# Patient Record
Sex: Female | Born: 1945
Health system: Southern US, Community
[De-identification: ages and names within clinical notes are randomized; demographics above are authoritative.]

## PROBLEM LIST (undated history)

## (undated) ENCOUNTER — Emergency Department (HOSPITAL_COMMUNITY): Payer: Medicare Other

## (undated) DIAGNOSIS — F419 Anxiety disorder, unspecified: Secondary | ICD-10-CM

## (undated) DIAGNOSIS — L509 Urticaria, unspecified: Secondary | ICD-10-CM

## (undated) DIAGNOSIS — K5900 Constipation, unspecified: Secondary | ICD-10-CM

## (undated) DIAGNOSIS — J309 Allergic rhinitis, unspecified: Secondary | ICD-10-CM

## (undated) DIAGNOSIS — J45909 Unspecified asthma, uncomplicated: Secondary | ICD-10-CM

## (undated) DIAGNOSIS — I255 Ischemic cardiomyopathy: Secondary | ICD-10-CM

## (undated) DIAGNOSIS — H11411 Vascular abnormalities of conjunctiva, right eye: Secondary | ICD-10-CM

## (undated) DIAGNOSIS — Z91018 Allergy to other foods: Secondary | ICD-10-CM

## (undated) DIAGNOSIS — I219 Acute myocardial infarction, unspecified: Secondary | ICD-10-CM

## (undated) DIAGNOSIS — N182 Chronic kidney disease, stage 2 (mild): Secondary | ICD-10-CM

## (undated) DIAGNOSIS — M1990 Unspecified osteoarthritis, unspecified site: Secondary | ICD-10-CM

## (undated) DIAGNOSIS — G629 Polyneuropathy, unspecified: Secondary | ICD-10-CM

## (undated) DIAGNOSIS — M26609 Unspecified temporomandibular joint disorder, unspecified side: Secondary | ICD-10-CM

## (undated) DIAGNOSIS — Z9289 Personal history of other medical treatment: Secondary | ICD-10-CM

## (undated) DIAGNOSIS — D649 Anemia, unspecified: Secondary | ICD-10-CM

## (undated) DIAGNOSIS — M797 Fibromyalgia: Secondary | ICD-10-CM

## (undated) DIAGNOSIS — M4120 Other idiopathic scoliosis, site unspecified: Secondary | ICD-10-CM

## (undated) DIAGNOSIS — K2270 Barrett's esophagus without dysplasia: Secondary | ICD-10-CM

## (undated) DIAGNOSIS — E7800 Pure hypercholesterolemia, unspecified: Secondary | ICD-10-CM

## (undated) DIAGNOSIS — K859 Acute pancreatitis without necrosis or infection, unspecified: Secondary | ICD-10-CM

## (undated) DIAGNOSIS — N200 Calculus of kidney: Secondary | ICD-10-CM

## (undated) DIAGNOSIS — I2510 Atherosclerotic heart disease of native coronary artery without angina pectoris: Secondary | ICD-10-CM

## (undated) DIAGNOSIS — I10 Essential (primary) hypertension: Secondary | ICD-10-CM

## (undated) DIAGNOSIS — G43909 Migraine, unspecified, not intractable, without status migrainosus: Secondary | ICD-10-CM

## (undated) DIAGNOSIS — Z789 Other specified health status: Secondary | ICD-10-CM

## (undated) DIAGNOSIS — K219 Gastro-esophageal reflux disease without esophagitis: Secondary | ICD-10-CM

## (undated) DIAGNOSIS — IMO0002 Reserved for concepts with insufficient information to code with codable children: Secondary | ICD-10-CM

## (undated) DIAGNOSIS — J189 Pneumonia, unspecified organism: Secondary | ICD-10-CM

## (undated) DIAGNOSIS — K449 Diaphragmatic hernia without obstruction or gangrene: Secondary | ICD-10-CM

## (undated) DIAGNOSIS — N816 Rectocele: Secondary | ICD-10-CM

## (undated) DIAGNOSIS — IMO0001 Reserved for inherently not codable concepts without codable children: Secondary | ICD-10-CM

## (undated) DIAGNOSIS — Z8719 Personal history of other diseases of the digestive system: Secondary | ICD-10-CM

## (undated) DIAGNOSIS — G4700 Insomnia, unspecified: Secondary | ICD-10-CM

## (undated) DIAGNOSIS — J342 Deviated nasal septum: Secondary | ICD-10-CM

## (undated) HISTORY — PX: TEMPOROMANDIBULAR JOINT SURGERY: SHX35

## (undated) HISTORY — PX: CYSTOCELE REPAIR: SHX163

## (undated) HISTORY — DX: Barrett's esophagus without dysplasia: K22.70

## (undated) HISTORY — DX: Other idiopathic scoliosis, site unspecified: M41.20

## (undated) HISTORY — DX: Other specified health status: Z78.9

## (undated) HISTORY — DX: Deviated nasal septum: J34.2

## (undated) HISTORY — PX: UPPER GASTROINTESTINAL ENDOSCOPY: SHX188

## (undated) HISTORY — DX: Reserved for inherently not codable concepts without codable children: IMO0001

## (undated) HISTORY — DX: Urticaria, unspecified: L50.9

## (undated) HISTORY — DX: Insomnia, unspecified: G47.00

## (undated) HISTORY — PX: OTHER SURGICAL HISTORY: SHX169

## (undated) HISTORY — PX: TONSILLECTOMY: SUR1361

## (undated) HISTORY — DX: Rectocele: N81.6

## (undated) HISTORY — PX: ANKLE SURGERY: SHX546

## (undated) HISTORY — DX: Reserved for concepts with insufficient information to code with codable children: IMO0002

## (undated) HISTORY — PX: TYMPANOSTOMY TUBE PLACEMENT: SHX32

## (undated) HISTORY — DX: Gastro-esophageal reflux disease without esophagitis: K21.9

## (undated) HISTORY — DX: Diaphragmatic hernia without obstruction or gangrene: K44.9

## (undated) HISTORY — DX: Acute myocardial infarction, unspecified: I21.9

## (undated) HISTORY — DX: Allergic rhinitis, unspecified: J30.9

## (undated) HISTORY — DX: Unspecified osteoarthritis, unspecified site: M19.90

## (undated) HISTORY — PX: COLONOSCOPY W/ POLYPECTOMY: SHX1380

## (undated) HISTORY — PX: COLONOSCOPY: SHX174

## (undated) HISTORY — DX: Pure hypercholesterolemia, unspecified: E78.00

## (undated) HISTORY — PX: APPENDECTOMY: SHX54

## (undated) HISTORY — DX: Vascular abnormalities of conjunctiva, right eye: H11.411

## (undated) HISTORY — DX: Personal history of other medical treatment: Z92.89

## (undated) HISTORY — PX: HAND SURGERY: SHX662

## (undated) HISTORY — DX: Chronic kidney disease, stage 2 (mild): N18.2

## (undated) HISTORY — PX: DENTAL SURGERY: SHX609

## (undated) HISTORY — PX: POLYPECTOMY: SHX149

## (undated) HISTORY — DX: Polyneuropathy, unspecified: G62.9

## (undated) HISTORY — PX: NASAL SEPTUM SURGERY: SHX37

## (undated) HISTORY — DX: Anemia, unspecified: D64.9

## (undated) HISTORY — DX: Acute pancreatitis without necrosis or infection, unspecified: K85.90

## (undated) HISTORY — DX: Unspecified temporomandibular joint disorder, unspecified side: M26.609

## (undated) HISTORY — PX: SINOSCOPY: SHX187

## (undated) HISTORY — PX: BLADDER SUSPENSION: SHX72

## (undated) HISTORY — PX: VAGINAL HYSTERECTOMY: SUR661

---

## 1999-04-22 ENCOUNTER — Encounter: Payer: Self-pay | Admitting: Internal Medicine

## 1999-04-22 ENCOUNTER — Other Ambulatory Visit: Admission: RE | Admit: 1999-04-22 | Discharge: 1999-04-22 | Payer: Self-pay | Admitting: Internal Medicine

## 1999-04-22 ENCOUNTER — Encounter (INDEPENDENT_AMBULATORY_CARE_PROVIDER_SITE_OTHER): Payer: Self-pay

## 2000-04-23 ENCOUNTER — Other Ambulatory Visit: Admission: RE | Admit: 2000-04-23 | Discharge: 2000-04-23 | Payer: Self-pay | Admitting: Family Medicine

## 2000-08-27 ENCOUNTER — Encounter: Payer: Self-pay | Admitting: Emergency Medicine

## 2000-08-27 ENCOUNTER — Emergency Department (HOSPITAL_COMMUNITY): Admission: EM | Admit: 2000-08-27 | Discharge: 2000-08-27 | Payer: Self-pay | Admitting: Emergency Medicine

## 2001-03-29 ENCOUNTER — Other Ambulatory Visit: Admission: RE | Admit: 2001-03-29 | Discharge: 2001-03-29 | Payer: Self-pay | Admitting: Emergency Medicine

## 2001-04-02 ENCOUNTER — Emergency Department (HOSPITAL_COMMUNITY): Admission: EM | Admit: 2001-04-02 | Discharge: 2001-04-03 | Payer: Self-pay | Admitting: Emergency Medicine

## 2001-04-09 ENCOUNTER — Encounter: Payer: Self-pay | Admitting: Emergency Medicine

## 2001-04-09 ENCOUNTER — Encounter (INDEPENDENT_AMBULATORY_CARE_PROVIDER_SITE_OTHER): Payer: Self-pay | Admitting: *Deleted

## 2001-04-09 ENCOUNTER — Encounter: Admission: RE | Admit: 2001-04-09 | Discharge: 2001-04-09 | Payer: Self-pay | Admitting: Emergency Medicine

## 2001-04-21 ENCOUNTER — Encounter: Payer: Self-pay | Admitting: Internal Medicine

## 2001-04-21 ENCOUNTER — Other Ambulatory Visit: Admission: RE | Admit: 2001-04-21 | Discharge: 2001-04-21 | Payer: Self-pay | Admitting: Internal Medicine

## 2001-04-21 ENCOUNTER — Encounter (INDEPENDENT_AMBULATORY_CARE_PROVIDER_SITE_OTHER): Payer: Self-pay | Admitting: Specialist

## 2001-05-27 ENCOUNTER — Encounter: Admission: RE | Admit: 2001-05-27 | Discharge: 2001-08-25 | Payer: Self-pay | Admitting: Neurology

## 2001-11-05 ENCOUNTER — Encounter: Admission: RE | Admit: 2001-11-05 | Discharge: 2002-02-03 | Payer: Self-pay | Admitting: Orthopedic Surgery

## 2002-06-16 ENCOUNTER — Other Ambulatory Visit: Admission: RE | Admit: 2002-06-16 | Discharge: 2002-06-16 | Payer: Self-pay | Admitting: Family Medicine

## 2002-11-18 ENCOUNTER — Encounter: Admission: RE | Admit: 2002-11-18 | Discharge: 2002-11-18 | Payer: Self-pay | Admitting: Orthopedic Surgery

## 2002-11-18 ENCOUNTER — Encounter: Payer: Self-pay | Admitting: Orthopedic Surgery

## 2003-03-23 ENCOUNTER — Other Ambulatory Visit: Admission: RE | Admit: 2003-03-23 | Discharge: 2003-03-23 | Payer: Self-pay | Admitting: Obstetrics and Gynecology

## 2004-05-28 ENCOUNTER — Other Ambulatory Visit: Admission: RE | Admit: 2004-05-28 | Discharge: 2004-05-28 | Payer: Self-pay | Admitting: Family Medicine

## 2004-09-17 ENCOUNTER — Ambulatory Visit: Payer: Self-pay | Admitting: Internal Medicine

## 2004-10-28 ENCOUNTER — Ambulatory Visit: Payer: Self-pay | Admitting: Internal Medicine

## 2004-10-29 ENCOUNTER — Ambulatory Visit: Payer: Self-pay | Admitting: Internal Medicine

## 2004-12-18 ENCOUNTER — Ambulatory Visit: Payer: Self-pay | Admitting: Internal Medicine

## 2005-01-06 ENCOUNTER — Ambulatory Visit: Payer: Self-pay | Admitting: Internal Medicine

## 2005-01-16 ENCOUNTER — Ambulatory Visit (HOSPITAL_COMMUNITY): Admission: RE | Admit: 2005-01-16 | Discharge: 2005-01-16 | Payer: Self-pay | Admitting: Orthopedic Surgery

## 2005-01-16 ENCOUNTER — Ambulatory Visit (HOSPITAL_BASED_OUTPATIENT_CLINIC_OR_DEPARTMENT_OTHER): Admission: RE | Admit: 2005-01-16 | Discharge: 2005-01-16 | Payer: Self-pay | Admitting: Orthopedic Surgery

## 2005-04-17 ENCOUNTER — Encounter: Admission: RE | Admit: 2005-04-17 | Discharge: 2005-04-17 | Payer: Self-pay | Admitting: Emergency Medicine

## 2005-04-30 ENCOUNTER — Ambulatory Visit: Payer: Self-pay | Admitting: Internal Medicine

## 2005-05-01 ENCOUNTER — Encounter: Admission: RE | Admit: 2005-05-01 | Discharge: 2005-06-11 | Payer: Self-pay | Admitting: Family Medicine

## 2005-08-13 ENCOUNTER — Ambulatory Visit: Payer: Self-pay | Admitting: Internal Medicine

## 2005-08-21 ENCOUNTER — Ambulatory Visit: Payer: Self-pay | Admitting: Internal Medicine

## 2005-09-26 ENCOUNTER — Ambulatory Visit: Payer: Self-pay | Admitting: Internal Medicine

## 2005-10-24 ENCOUNTER — Ambulatory Visit: Payer: Self-pay | Admitting: Internal Medicine

## 2005-10-27 ENCOUNTER — Ambulatory Visit: Payer: Self-pay | Admitting: Internal Medicine

## 2005-10-28 ENCOUNTER — Ambulatory Visit: Payer: Self-pay | Admitting: Internal Medicine

## 2005-10-31 ENCOUNTER — Ambulatory Visit: Payer: Self-pay | Admitting: Internal Medicine

## 2005-11-03 ENCOUNTER — Ambulatory Visit: Payer: Self-pay | Admitting: Internal Medicine

## 2005-12-05 ENCOUNTER — Ambulatory Visit: Payer: Self-pay | Admitting: Internal Medicine

## 2005-12-18 ENCOUNTER — Ambulatory Visit: Payer: Self-pay | Admitting: Internal Medicine

## 2005-12-22 ENCOUNTER — Encounter (INDEPENDENT_AMBULATORY_CARE_PROVIDER_SITE_OTHER): Payer: Self-pay | Admitting: *Deleted

## 2005-12-23 ENCOUNTER — Inpatient Hospital Stay (HOSPITAL_COMMUNITY): Admission: RE | Admit: 2005-12-23 | Discharge: 2005-12-24 | Payer: Self-pay | Admitting: Urology

## 2005-12-29 ENCOUNTER — Inpatient Hospital Stay (HOSPITAL_COMMUNITY): Admission: AD | Admit: 2005-12-29 | Discharge: 2005-12-29 | Payer: Self-pay | Admitting: Obstetrics and Gynecology

## 2005-12-29 ENCOUNTER — Encounter (INDEPENDENT_AMBULATORY_CARE_PROVIDER_SITE_OTHER): Payer: Self-pay | Admitting: *Deleted

## 2005-12-30 ENCOUNTER — Ambulatory Visit: Payer: Self-pay | Admitting: Internal Medicine

## 2006-04-21 ENCOUNTER — Emergency Department (HOSPITAL_COMMUNITY): Admission: EM | Admit: 2006-04-21 | Discharge: 2006-04-21 | Payer: Self-pay | Admitting: Emergency Medicine

## 2006-07-16 ENCOUNTER — Encounter: Admission: RE | Admit: 2006-07-16 | Discharge: 2006-07-16 | Payer: Self-pay | Admitting: Family Medicine

## 2007-01-18 ENCOUNTER — Ambulatory Visit: Payer: Self-pay | Admitting: Internal Medicine

## 2007-08-15 ENCOUNTER — Encounter: Admission: RE | Admit: 2007-08-15 | Discharge: 2007-08-15 | Payer: Self-pay | Admitting: Family Medicine

## 2007-11-29 DIAGNOSIS — M26609 Unspecified temporomandibular joint disorder, unspecified side: Secondary | ICD-10-CM | POA: Insufficient documentation

## 2007-11-29 DIAGNOSIS — M4120 Other idiopathic scoliosis, site unspecified: Secondary | ICD-10-CM | POA: Insufficient documentation

## 2007-11-29 DIAGNOSIS — IMO0001 Reserved for inherently not codable concepts without codable children: Secondary | ICD-10-CM | POA: Insufficient documentation

## 2007-11-29 DIAGNOSIS — J302 Other seasonal allergic rhinitis: Secondary | ICD-10-CM | POA: Insufficient documentation

## 2007-11-29 DIAGNOSIS — D126 Benign neoplasm of colon, unspecified: Secondary | ICD-10-CM

## 2007-11-29 DIAGNOSIS — K2270 Barrett's esophagus without dysplasia: Secondary | ICD-10-CM | POA: Insufficient documentation

## 2007-11-29 DIAGNOSIS — J3089 Other allergic rhinitis: Secondary | ICD-10-CM

## 2007-11-29 DIAGNOSIS — M1990 Unspecified osteoarthritis, unspecified site: Secondary | ICD-10-CM | POA: Insufficient documentation

## 2007-11-29 DIAGNOSIS — K219 Gastro-esophageal reflux disease without esophagitis: Secondary | ICD-10-CM | POA: Insufficient documentation

## 2007-11-29 DIAGNOSIS — G4700 Insomnia, unspecified: Secondary | ICD-10-CM | POA: Insufficient documentation

## 2007-11-29 DIAGNOSIS — M797 Fibromyalgia: Secondary | ICD-10-CM

## 2007-11-29 DIAGNOSIS — K449 Diaphragmatic hernia without obstruction or gangrene: Secondary | ICD-10-CM | POA: Insufficient documentation

## 2007-12-03 ENCOUNTER — Ambulatory Visit: Payer: Self-pay | Admitting: Internal Medicine

## 2007-12-09 ENCOUNTER — Encounter: Payer: Self-pay | Admitting: Internal Medicine

## 2008-02-16 ENCOUNTER — Ambulatory Visit: Payer: Self-pay | Admitting: Internal Medicine

## 2008-08-15 ENCOUNTER — Ambulatory Visit: Payer: Self-pay | Admitting: Internal Medicine

## 2008-08-24 ENCOUNTER — Encounter: Payer: Self-pay | Admitting: Internal Medicine

## 2008-08-24 ENCOUNTER — Ambulatory Visit: Payer: Self-pay | Admitting: Internal Medicine

## 2008-08-26 ENCOUNTER — Encounter: Payer: Self-pay | Admitting: Internal Medicine

## 2008-10-27 ENCOUNTER — Telehealth: Payer: Self-pay | Admitting: Internal Medicine

## 2008-10-27 ENCOUNTER — Ambulatory Visit: Payer: Self-pay | Admitting: Internal Medicine

## 2008-11-02 HISTORY — PX: OTHER SURGICAL HISTORY: SHX169

## 2008-11-29 ENCOUNTER — Ambulatory Visit: Payer: Self-pay | Admitting: Internal Medicine

## 2008-11-29 ENCOUNTER — Telehealth (INDEPENDENT_AMBULATORY_CARE_PROVIDER_SITE_OTHER): Payer: Self-pay | Admitting: *Deleted

## 2009-05-08 ENCOUNTER — Ambulatory Visit: Payer: Self-pay | Admitting: Internal Medicine

## 2009-05-11 ENCOUNTER — Telehealth: Payer: Self-pay | Admitting: Internal Medicine

## 2009-07-24 ENCOUNTER — Encounter
Admission: RE | Admit: 2009-07-24 | Discharge: 2009-07-24 | Payer: Self-pay | Admitting: Physical Medicine and Rehabilitation

## 2009-08-04 HISTORY — PX: KNEE ARTHROSCOPY: SHX127

## 2009-09-07 ENCOUNTER — Encounter: Payer: Self-pay | Admitting: Internal Medicine

## 2009-11-20 ENCOUNTER — Encounter: Admission: RE | Admit: 2009-11-20 | Discharge: 2009-11-20 | Payer: Self-pay | Admitting: Orthopedic Surgery

## 2009-11-22 ENCOUNTER — Encounter: Admission: RE | Admit: 2009-11-22 | Discharge: 2009-11-22 | Payer: Self-pay | Admitting: Family Medicine

## 2009-11-22 ENCOUNTER — Encounter: Payer: Self-pay | Admitting: Internal Medicine

## 2009-12-02 HISTORY — PX: EYE SURGERY: SHX253

## 2009-12-12 ENCOUNTER — Ambulatory Visit
Admission: RE | Admit: 2009-12-12 | Discharge: 2009-12-12 | Payer: Self-pay | Source: Home / Self Care | Admitting: Ophthalmology

## 2009-12-12 ENCOUNTER — Encounter (INDEPENDENT_AMBULATORY_CARE_PROVIDER_SITE_OTHER): Payer: Self-pay | Admitting: *Deleted

## 2010-02-08 ENCOUNTER — Telehealth: Payer: Self-pay | Admitting: Internal Medicine

## 2010-02-13 ENCOUNTER — Ambulatory Visit: Payer: Self-pay | Admitting: Internal Medicine

## 2010-03-06 ENCOUNTER — Encounter: Payer: Self-pay | Admitting: Internal Medicine

## 2010-03-25 ENCOUNTER — Encounter: Admission: RE | Admit: 2010-03-25 | Discharge: 2010-05-10 | Payer: Self-pay | Admitting: Orthopaedic Surgery

## 2010-03-28 ENCOUNTER — Ambulatory Visit: Payer: Self-pay | Admitting: Internal Medicine

## 2010-03-28 DIAGNOSIS — J45909 Unspecified asthma, uncomplicated: Secondary | ICD-10-CM

## 2010-04-23 ENCOUNTER — Encounter
Admission: RE | Admit: 2010-04-23 | Discharge: 2010-05-27 | Payer: Self-pay | Source: Home / Self Care | Attending: Rheumatology | Admitting: Rheumatology

## 2010-05-08 ENCOUNTER — Ambulatory Visit: Payer: Self-pay | Admitting: Internal Medicine

## 2010-05-15 ENCOUNTER — Ambulatory Visit: Payer: Self-pay | Admitting: Internal Medicine

## 2010-05-15 DIAGNOSIS — Z860100 Personal history of colon polyps, unspecified: Secondary | ICD-10-CM | POA: Insufficient documentation

## 2010-05-15 LAB — CONVERTED CEMR LAB: IgE (Immunoglobulin E), Serum: 11.8 intl units/mL (ref 0.0–180.0)

## 2010-05-16 ENCOUNTER — Ambulatory Visit: Payer: Self-pay | Admitting: Internal Medicine

## 2010-05-17 ENCOUNTER — Encounter: Payer: Self-pay | Admitting: Internal Medicine

## 2010-06-13 ENCOUNTER — Ambulatory Visit: Payer: Self-pay | Admitting: Internal Medicine

## 2010-07-12 ENCOUNTER — Encounter
Admission: RE | Admit: 2010-07-12 | Discharge: 2010-07-31 | Payer: Self-pay | Source: Home / Self Care | Attending: Family Medicine | Admitting: Family Medicine

## 2010-08-04 ENCOUNTER — Encounter: Admission: RE | Admit: 2010-08-04 | Payer: Self-pay | Source: Home / Self Care | Admitting: Family Medicine

## 2010-08-06 ENCOUNTER — Encounter
Admission: RE | Admit: 2010-08-06 | Discharge: 2010-08-27 | Payer: Medicare Other | Source: Home / Self Care | Attending: Family Medicine | Admitting: Family Medicine

## 2010-08-07 ENCOUNTER — Encounter: Admit: 2010-08-07 | Payer: Self-pay | Admitting: Family Medicine
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
## 2010-09-05 NOTE — Procedures (Signed)
Summary: Colonoscopy  [name] Note: All result statuses are Final unless otherwise noted.  Tests: (1) Colonoscopy (COL)   COL Colonoscopy           DONE     [facility]     [address]     [place], [place]  [address]           COLONOSCOPY PROCEDURE REPORT           PATIENT:  Donna Bailey, Donna Bailey  MR#:  [id number]     BIRTHDATE:  September 24, 1945, 64 yrs. old  GENDER:  female     ENDOSCOPIST:  Wilhemina Bonito. Eda Keys, MD     REF. BY:  Office     PROCEDURE DATE:  05/16/2010     PROCEDURE:  Colonoscopy with snare polypectomy x 2     ASA CLASS:  Class II     INDICATIONS:  history of pre-cancerous (adenomatous) colon polyps,     surveillance and high-risk screening, rectal bleeding ; index 2005     w/ TA; f/u 2007 neg     MEDICATIONS:   Fentanyl 50 mcg IV, Versed 7 mg IV, Benadryl 25 mg     IV           DESCRIPTION OF PROCEDURE:   After the risks benefits and     alternatives of the procedure were thoroughly explained, informed     consent was obtained.  Digital rectal exam was performed and     revealed no abnormalities.   The LB [id number] endoscope     was introduced through the anus and advanced to the cecum, which     was identified by both the appendix and ileocecal valve, without     limitations. Time to cecum = 2:224 min.The quality of the prep was     excellent, using MoviPrep.  The instrument was then slowly     withdrawn (time = 13:50 min) as the colon was fully examined.     <<PROCEDUREIMAGES>>           FINDINGS:  The terminal ileum appeared normal.  Two polyps were     found- 1mm in the ascending colon and 3mm in the transverse colon.     Polyps were snared without cautery. Retrieval was successful.The     remainder of the colon was entirely normal.   Retroflexed views in     the rectum revealed small internal hemorrhoids.    The scope was     then withdrawn from the patient and the procedure completed.           COMPLICATIONS:  None  ENDOSCOPIC IMPRESSION:     1) Normal terminal ileum     2) Two polyps - removed     3) Small Internal hemorrhoids           RECOMMENDATIONS:     1) Follow up colonoscopy in 5 years           ______________________________     Wilhemina Bonito. Eda Keys, MD           CC:  Carolin Coy, MD;   The Patient           n.     eSIGNED:   Wilhemina Bonito. Eda Keys at 05/16/2010 11:26 AM           Threasa Heads, [id number]  Note: An exclamation mark (!) indicates a result that was not dispersed into the flowsheet. Document Creation Date:  05/16/2010 11:27 AM _______________________________________________________________________  (1) Order result status: Final Collection or observation date-time: 05/16/2010 11:20 Requested date-time:  Receipt date-time:  Reported date-time:  Referring Physician:   Ordering Physician: Fransico Setters [phone]) Specimen Source:  Source: Launa Grill Order Number: [id number] Lab site:   Appended Document: Colonoscopy     Procedures Next Due Date:    Colonoscopy: 05/2015
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
## 2010-09-05 NOTE — Letter (Signed)
[facility]: [facility] [place]   Imported By: Lester Beauregard 04/23/2010 09:56:48  _____________________________________________________________________  External Attachment:    Type:   Image     Comment:   External Document

## 2010-09-05 NOTE — Miscellaneous (Signed)
Summary: refill Nexium 40 mg  Clinical Lists Changes  Medications: Rx of NEXIUM 40 MG  CPDR (ESOMEPRAZOLE MAGNESIUM) Take 1 capsule by mouth two times a day;  #68 x 5;  Signed;  Entered by: Milford Cage NCMA;  Authorized by: Hilarie Fredrickson MD;  Method used: [facility] to [facility]  [phone]*, [address] [place], [place]  [address], Ph: [phone] or [phone], Fax: [phone]    Prescriptions: NEXIUM 40 MG  CPDR (ESOMEPRAZOLE MAGNESIUM) Take 1 capsule by mouth two times a day  #68 x 5   Entered by:   Milford Cage NCMA   Authorized by:   Hilarie Fredrickson MD   Signed by:   Milford Cage NCMA on 09/07/2009   Method used:   Electronically to        [facility]  [phone]* (retail)       [address] [place], [place]  [address]       Ph: [phone] or [phone]       Fax: [phone]   RxID:   [id number]
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
## 2010-09-05 NOTE — Letter (Signed)
Summary: [facility] Instructions  [facility]  [address] [place], [place] [address]   Phone: [phone]  Fax: [phone]       Donna Bailey    [birth year]    MRN: [id number]        Procedure Day /Date:THURSDAY 05/16/10     Arrival Time:9:30 AM     Procedure Time:10:30 AM     Location of Procedure:                    X  [facility] (4th Floor) [address])                       PREPARATION FOR COLONOSCOPY WITH MOVIPREP    THE DAY BEFORE YOUR PROCEDURE         DATE:05/15/10  DAY: TODAY 1.  Drink clear liquids the entire day-NO SOLID FOOD  2.  Do not drink anything colored red or purple.  Avoid juices with pulp.  No orange juice.  3.  Drink at least 64 oz. (8 glasses) of fluid/clear liquids during the day to prevent dehydration and help the prep work efficiently.  CLEAR LIQUIDS INCLUDE: Water Jello Ice Popsicles Tea (sugar ok, no milk/cream) Powdered fruit flavored drinks Coffee (sugar ok, no milk/cream) Gatorade Juice: apple, white grape, white cranberry  Lemonade Clear bullion, consomm, broth Carbonated beverages (any kind) Strained chicken noodle soup Hard Candy                             4.  In the morning, mix first dose of MoviPrep solution:    Empty 1 Pouch A and 1 Pouch B into the disposable container    Add lukewarm drinking water to the top line of the container. Mix to dissolve    Refrigerate (mixed solution should be used within 24 hrs)  5.  Begin drinking the prep at 5:00 p.m. The MoviPrep container is divided by 4 marks.   Every 15 minutes drink the solution down to the next mark (approximately 8 oz) until the full liter is complete.   6.  Follow completed prep with 16 oz of clear liquid of your choice (Nothing red or purple).  Continue to drink clear liquids until bedtime.  7.  Before going to bed, mix second dose of MoviPrep solution:    Empty 1 Pouch A and 1 Pouch B into the disposable container     Add lukewarm drinking water to the top line of the container. Mix to dissolve    Refrigerate  THE DAY OF YOUR PROCEDURE      DATE:05/16/10 DAY: THURSDAY  Beginning at 5:30 a.m. (5 hours before procedure):         1. Every 15 minutes, drink the solution down to the next mark (approx 8 oz) until the full liter is complete.  2. Follow completed prep with 16 oz. of clear liquid of your choice.    3. You may drink clear liquids until 8:30 AM (2 HOURS BEFORE PROCEDURE).   MEDICATION INSTRUCTIONS  Unless otherwise instructed, you should take regular prescription medications with a small sip of water   as early as possible the morning of your procedure.         OTHER INSTRUCTIONS  You will need a responsible adult at least 64 years of age to accompany you and drive you home.   This person must remain in the waiting  room during your procedure.  Wear loose fitting clothing that is easily removed.  Leave jewelry and other valuables at home.  However, you may wish to bring a book to read or  an iPod/MP3 player to listen to music as you wait for your procedure to start.  Remove all body piercing jewelry and leave at home.  Total time from sign-in until discharge is approximately 2-3 hours.  You should go home directly after your procedure and rest.  You can resume normal activities the  day after your procedure.  The day of your procedure you should not:   Drive   Make legal decisions   Operate machinery   Drink alcohol   Return to work  You will receive specific instructions about eating, activities and medications before you leave.    The above instructions have been reviewed and explained to me by   _______________________    I fully understand and can verbalize these instructions _____________________________ Date _________

## 2010-09-05 NOTE — Progress Notes (Signed)
Summary: Nexium allergy?   Phone Note Call from Patient Call back at Home Phone [phone]   Caller: Patient Call For: Dr. Marina Goodell Reason for Call: Talk to Nurse Summary of Call: pt. feels like she is allergic to Nexium. She has been itching in her eyes, knees and legs, then she skipped a dose of Nexium and did not have the reaction Initial call taken by: Karna Christmas,  February 08, 2010 8:57 AM  Follow-up for Phone Call        Says she thought she was developing food allergy last wk. when she developed symptoms.but took Nexium without food and symptoms re-occurred.Has held Nexium since Tuesday and symptoms have gone away.Asking what should she do till she can be scheduled for OV? Follow-up by: Teryl Lucy RN,  February 08, 2010 11:28 AM  Additional Follow-up for Phone Call Additional follow up Details #1::        At this point, the Nexium is highly unlikely to cause these symptoms. She can continue Nexium and see her PCP regarding other causes for symtoms if they return. No need for GI OV Additional Follow-up by: Hilarie Fredrickson MD,  February 08, 2010 11:31 AM    Additional Follow-up for Phone Call Additional follow up Details #2::    Pt. notified.Will discuss this with Dr.Young as she has appt. with him next week.. Follow-up by: Teryl Lucy RN,  February 08, 2010 11:58 AM

## 2010-09-05 NOTE — Letter (Signed)
Summary: Patient Notice- Polyp Results  [facility]  [address] [place], [place] [address]   Phone: [phone]  Fax: [phone]        May 17, 2010 MRN: [id number]    [facility] [address] [place], [place]  [address]    Dear Ms. Fontaine,  I am pleased to inform you that the colon polyp(s) removed during your recent colonoscopy was (were) found to be benign (no cancer detected) upon pathologic examination.  I recommend you have a repeat colonoscopy examination in 5 years to look for recurrent polyps, as having colon polyps increases your risk for having recurrent polyps or even colon cancer in the future.  Should you develop new or worsening symptoms of abdominal pain, bowel habit changes or bleeding from the rectum or bowels, please schedule an evaluation with either your primary care physician or with me.   Additional information/recommendations:  __ No further action with gastroenterology is needed at this time. Please      follow-up with your primary care physician for your other healthcare      needs.   Please call us if you are having persistent problems or have questions about your condition that have not been fully answered at this time.  Sincerely,  Hilarie Fredrickson MD  This letter has been electronically signed by your physician.

## 2010-09-24 ENCOUNTER — Ambulatory Visit (INDEPENDENT_AMBULATORY_CARE_PROVIDER_SITE_OTHER): Payer: Medicare Other | Admitting: Internal Medicine

## 2010-09-24 ENCOUNTER — Encounter: Payer: Self-pay | Admitting: Internal Medicine

## 2010-09-24 DIAGNOSIS — J209 Acute bronchitis, unspecified: Secondary | ICD-10-CM

## 2010-09-24 DIAGNOSIS — J309 Allergic rhinitis, unspecified: Secondary | ICD-10-CM
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
## 2010-11-18 ENCOUNTER — Emergency Department (HOSPITAL_COMMUNITY): Payer: Medicare Other

## 2010-11-18 ENCOUNTER — Emergency Department (HOSPITAL_COMMUNITY)
Admission: EM | Admit: 2010-11-18 | Discharge: 2010-11-18 | Disposition: A | Discharge status: Home / Self Care | Payer: Medicare Other | Attending: Emergency Medicine | Admitting: Emergency Medicine

## 2010-11-18 DIAGNOSIS — M25569 Pain in unspecified knee: Secondary | ICD-10-CM | POA: Insufficient documentation

## 2010-11-18 DIAGNOSIS — M545 Low back pain, unspecified: Secondary | ICD-10-CM | POA: Insufficient documentation

## 2010-11-18 DIAGNOSIS — T1490XA Injury, unspecified, initial encounter: Secondary | ICD-10-CM | POA: Insufficient documentation

## 2010-11-18 DIAGNOSIS — M542 Cervicalgia: Secondary | ICD-10-CM | POA: Insufficient documentation

## 2010-11-18 DIAGNOSIS — Y92410 Unspecified street and highway as the place of occurrence of the external cause: Secondary | ICD-10-CM | POA: Insufficient documentation

## 2010-11-20 ENCOUNTER — Emergency Department (HOSPITAL_COMMUNITY): Payer: Medicare Other

## 2010-11-20 ENCOUNTER — Emergency Department (HOSPITAL_COMMUNITY)
Admission: EM | Admit: 2010-11-20 | Discharge: 2010-11-20 | Disposition: A | Discharge status: Home / Self Care | Payer: Medicare Other | Attending: Emergency Medicine | Admitting: Emergency Medicine

## 2010-11-20 DIAGNOSIS — R05 Cough: Secondary | ICD-10-CM | POA: Insufficient documentation

## 2010-11-20 DIAGNOSIS — R059 Cough, unspecified: Secondary | ICD-10-CM | POA: Insufficient documentation

## 2010-11-20 DIAGNOSIS — R091 Pleurisy: Secondary | ICD-10-CM | POA: Insufficient documentation

## 2010-11-20 DIAGNOSIS — Z79899 Other long term (current) drug therapy: Secondary | ICD-10-CM | POA: Insufficient documentation

## 2010-11-20 DIAGNOSIS — J40 Bronchitis, not specified as acute or chronic: Secondary | ICD-10-CM | POA: Insufficient documentation

## 2010-11-20 DIAGNOSIS — R071 Chest pain on breathing: Secondary | ICD-10-CM | POA: Insufficient documentation

## 2010-11-20 DIAGNOSIS — R21 Rash and other nonspecific skin eruption: Secondary | ICD-10-CM | POA: Insufficient documentation

## 2010-11-20 DIAGNOSIS — G8929 Other chronic pain: Secondary | ICD-10-CM | POA: Insufficient documentation

## 2010-11-20 DIAGNOSIS — M549 Dorsalgia, unspecified: Secondary | ICD-10-CM | POA: Insufficient documentation

## 2010-11-20 LAB — CBC
HCT: 38.9 % (ref 36.0–46.0)
MCHC: 34.2 g/dL (ref 30.0–36.0)
MCV: 91.5 fL (ref 78.0–100.0)
Platelets: 228 10*3/uL (ref 150–400)
RDW: 14.6 % (ref 11.5–15.5)
WBC: 5.1 10*3/uL (ref 4.0–10.5)

## 2010-11-20 LAB — POCT CARDIAC MARKERS
CKMB, poc: <1 ng/mL — ABNORMAL LOW (ref 1.0–8.0)
Myoglobin, poc: 77.3 ng/mL (ref 12–200)
Troponin i, poc: <0.05 ng/mL (ref 0.00–0.09)
Troponin i, poc: <0.05 ng/mL (ref 0.00–0.09)

## 2010-11-20 LAB — DIFFERENTIAL
Basophils Absolute: 0 10*3/uL (ref 0.0–0.1)
Basophils Relative: 1 % (ref 0–1)
Lymphocytes Relative: 33 % (ref 12–46)
Neutro Abs: 2.8 10*3/uL (ref 1.7–7.7)

## 2010-11-20 LAB — D-DIMER, QUANTITATIVE: D-Dimer, Quant: 0.73 ug/mL-FEU — ABNORMAL HIGH (ref 0.00–0.48)

## 2010-11-20 LAB — BASIC METABOLIC PANEL
BUN: 10 mg/dL (ref 6–23)
Calcium: 8.7 mg/dL (ref 8.4–10.5)
Creatinine, Ser: 0.96 mg/dL (ref 0.4–1.2)
GFR calc non Af Amer: 59 mL/min — ABNORMAL LOW (ref >60)
Glucose, Bld: 77 mg/dL (ref 70–99)

## 2010-11-20 MED ORDER — IOHEXOL 300 MG/ML  SOLN: 100.0000 mL | Freq: Once | INTRAMUSCULAR | Status: DC | PRN

## 2010-11-21 ENCOUNTER — Telehealth: Payer: Self-pay | Admitting: Internal Medicine

## 2010-11-21 MED ORDER — DOXYCYCLINE HYCLATE 100 MG PO TABS: ORAL_TABLET | ORAL | Status: AC

## 2010-11-21 NOTE — Telephone Encounter (Signed)
Per CDY-okay to give Doxycycline 100mg  #8 take 2 today then 1 daily no refills and mucinex DM for cough and congestion.
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
## 2010-11-21 NOTE — Telephone Encounter (Signed)
Pt aware of CDY recs for doxy and mucinex dm. She has called back and will also come for OV with CDY on Fri., 4/20.

## 2010-11-22 ENCOUNTER — Ambulatory Visit (INDEPENDENT_AMBULATORY_CARE_PROVIDER_SITE_OTHER): Payer: No Typology Code available for payment source | Admitting: Internal Medicine

## 2010-11-22 ENCOUNTER — Encounter: Payer: Self-pay | Admitting: Internal Medicine

## 2010-11-22 VITALS — BP 116/70 | HR 76 | Ht 65.5 in | Wt 180.2 lb

## 2010-11-22 DIAGNOSIS — R29818 Other symptoms and signs involving the nervous system: Secondary | ICD-10-CM

## 2010-11-22 DIAGNOSIS — J209 Acute bronchitis, unspecified: Secondary | ICD-10-CM

## 2010-11-22 MED ORDER — METHYLPREDNISOLONE ACETATE 80 MG/ML IJ SUSP
80.0000 mg | Freq: Once | INTRAMUSCULAR | Status: AC
  Administered 2010-11-22: 80 mg via INTRAMUSCULAR

## 2010-11-22 MED ORDER — ALBUTEROL SULFATE (2.5 MG/3ML) 0.083% IN NEBU
2.5000 mg | INHALATION_SOLUTION | Freq: Once | RESPIRATORY_TRACT | Status: AC
  Administered 2010-11-22: 2.5 mg via RESPIRATORY_TRACT
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
## 2010-11-22 NOTE — Patient Instructions (Addendum)
Orders- neb alb                Depo 80  Consider taking acid blocker twice daily for next few days till you feel that   You can try an otc cough syrup like Robbitusin DM or Delsym.   Finish doxycycline
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
## 2010-11-24 ENCOUNTER — Encounter: Payer: Self-pay | Admitting: Internal Medicine
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
## 2010-11-29 ENCOUNTER — Telehealth: Payer: Self-pay | Admitting: Internal Medicine

## 2010-11-29 MED ORDER — CLARITHROMYCIN 500 MG PO TABS: 500.0000 mg | ORAL_TABLET | Freq: Two times a day (BID) | ORAL | Status: AC

## 2010-11-29 NOTE — Telephone Encounter (Signed)
Pt was seen on 11-22-10 and given doxy and depo injection. Spoke with patient and she states she finished doxycycline on 11-28-10 and she states she still has productive cough with green phlegm, trouble taking a deep breath without coughing, and wheezing. She states she does not feel any real improvement in any symptoms. She also states that she saw her chiropractor today and he listened to her lungs and told her they sounded congested and she should call Dr. Maple Hudson today. Pt wants to know what CY recs. Please advise. Carron Curie, CMA Allergies  Allergen Reactions  . Ciprofloxacin   . Formoterol Fumarate   . Levofloxacin     REACTION: HEART RACING  . Penicillins   . Sulfonamide Derivatives

## 2010-11-29 NOTE — Telephone Encounter (Signed)
Per CDY-okay to give Biaxin 500mg  #14 take 1 po bid after meals no refills. Pt aware Rx sent.

## 2010-12-17 NOTE — Assessment & Plan Note (Signed)
[facility]                         GASTROENTEROLOGY OFFICE NOTE   NAME:Donna Bailey, Donna Bailey                   MRN:          [id number]  DATE:01/18/2007                            DOB:          03/13/1946    The patient presents today for followup and requests medication refill.  She is a 65 year old with a history of gastroesophageal reflux disease  complicated by Barrett's esophagus, adenomatous colon polyps,  fibromyalgia, and allergic rhinitis.  She was last seen in the office on  August 13, 2005.  See that dictation for details.  On August 21, 2005,  she underwent a colonoscopy and upper endoscopy.  The colonoscopy was  normal.  Followup in five years recommended.  Upper endoscopy revealed  Barrett's esophagus.  Biopsies were taken.  No dysplasia.  Multiple  fundic gland polyps were also noted and biopsy confirmation obtained.  Followup in 3 years recommended.   The patient has been on Nexium twice daily.  On the medication she has  had fairly good control of reflux symptoms.  She does complain of  chronic nausea which she thought may be related to antidepressant.  Nausea seems to be relieved with food.  No dysphagia.  She has also had  a little blood in her stools, which she thought was due to her  antidepressant.  I told her it was more likely due to hemorrhoids or  fissure.  She had some questions regarding her next followup  appointments.  We did send her a letter regarding her biopsy results and  followup recommendations.  The patient also reports that her bowel  habits have alternated between somewhat loose and somewhat constipated,  also some gas.   CURRENT MEDICATIONS:  1. Nexium 40 mg b.i.d.  2. Benadryl p.r.n.  3. Naprosyn p.r.n.  4. Ambien p.r.n.   PHYSICAL EXAMINATION:  GENERAL:  Well-appearing female in no acute  distress.  VITAL SIGNS:  Blood pressure is 124/82, heart rate 60 and regular,  weight is 192 pounds.  HEENT:   Sclerae are anicteric.  LUNGS:  Clear.  HEART:  Regular.  ABDOMEN:  Soft without tenderness, mass, or hernia.   IMPRESSION:  1. Gastroesophageal reflux disease complicated by Barrett's esophagus.  2. History of adenomatous colon polyps.  3. Alternating bowel habits most consistent with irritable bowel.   RECOMMENDATIONS:  1. Refill Nexium 40 mg b.i.d.  2. Fiber supplement.  3. Surveillance upper endoscopy - January 2010, and surveillance      colonoscopy - January      2012 as previously recommended.  4. Resume general medical care with Dr. Dorothe Pea.     Wilhemina Bonito. Marina Goodell, MD  Electronically Signed    JNP/MedQ  DD: 01/18/2007  DT: 01/18/2007  Job #: [id number]   cc:   Jethro Bastos, M.D.

## 2010-12-20 NOTE — Op Note (Signed)
NAME:  Donna Bailey, Donna Bailey            ACCOUNT NO.:  [id number]   MEDICAL RECORD NO.:  [id number]          PATIENT TYPE:  OIB   LOCATION:  1610                         [facility]ACILITY:  [facility]   PHYSICIAN:  Valetta Fuller, M.D.  DATE OF BIRTH:  1946-05-23   DATE OF PROCEDURE:  12/22/2005  DATE OF DISCHARGE:                                 OPERATIVE REPORT   PREOPERATIVE DIAGNOSES:  1.  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  2.  Stress incontinence.   POSTOPERATIVE DIAGNOSES:  1.  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  2.  Stress incontinence.   PROCEDURE PERFORMED:  Transobturator suburethral sling.   SURGEON:  Valetta Fuller, M.D.   ANESTHESIA:  General.   INDICATIONS:  Donna Bailey has come in to see me with some significant issues.  She has had global pelvic prolapse and has been diagnosed with uterine  prolapse as well as anterior and posterior prolapse and has been seen and  evaluated by Dr. Billy Coast.  Her major problem has been fairly significant  uterine prolapse.  She did not have any objective stress incontinence, but  we felt that she did have some outlet obstruction secondary to the prolapse.  As part of that, we decided to do video urodynamics, which were done.  The  patient was able to void and had no significant postvoid residual.  Her  prolapse had to be reduced in order to insert the catheter.  Her maximum  cystometric capacity was close to 800 mL and her bladder was quite  compliant.  She did have some hypersensitivity, but no evidence of  instability.  She had no leaking prior to packing, but with packing, she did  have some mild stress incontinence with a Valsalva leak point pressure of  approximately 130 cmH20 pressure.  She was able to generate a voluntary  detrusor contraction and emptied her bladder well.  We felt that the patient  did have significant problems with hesitancy and some difficulty emptying  her bladder primarily  due to obstruction from the prolapse.  We also felt  that the prolapse was causing enough obstruction that it was masking some  incontinence and with prolapse completely reduced, she did have some stress  leakage.  For that reason, we felt that she probably ought to have a  suburethral sling in conjunction with her uterine prolapse and anterior and  posterior repairs.  She is scheduled to have a hysterectomy by Dr. Billy Coast  with anterior and posterior repair.  She appeared to understand the  advantages and disadvantages of a surgical approach in this manner.  When we  entered the operating room, the patient was in a high lithotomy position and  she had already had her hysterectomy and anterior and posterior repair.  A  small amount of the anterior incision had been left open with dissection  already done, exposing the mid-urethra.  A Foley catheter was indwelling.  We lowered her lithotomy position to a mid-lithotomy position and placed a  vaginal speculum.  There did not appear to be any significant bleeding.  With palpation, we were already  able to palpate the obturator membranes  bilaterally.  I did extend the incision a little bit more distally on the  urethra to allow for good position at the mid-urethral level.  The incision  was again slightly extended, but the rest the dissection had already been  accomplished.   We then made 2 small stab incisions approximately 5 cm lateral to clitoris  on both sides.  Utilizing the curved needle passers, we were able to pass  these out through the obturator fossa with direct digital finger control,  taking great care not to perforate the vaginal mucosa.  Once this was done  on both sides, the sling was grabbed in the usual manner and then positioned  at the mid-urethra, utilizing a right-angle clamp to assure proper  tensioning.  The sheath was then removed in the standard manner.  Redundant  sheath was then removed and the little obturator  incisions were closed with  Dermabond.  Cystoscopy was then performed; we saw no evidence of any  urethral or bladder injury.  Vaginal mucosa was then closed with a running 2-  0 Vicryl suture and some Estrace vaginal packing was utilized.  The patient  was brought to the recovery room in stable condition.           ______________________________  Valetta Fuller, M.D.  Electronically Signed     DSG/MEDQ  D:  12/22/2005  T:  12/23/2005  Job:  [id number]   cc:   Lenoard Aden, M.D.  Fax: [phone]

## 2010-12-20 NOTE — Op Note (Signed)
NAME:  Donna Bailey, Donna Bailey            ACCOUNT NO.:  [id number]   MEDICAL RECORD NO.:  [id number]          PATIENT TYPE:  AMB   LOCATION:  DSC                          FACILITY:  [facility]   PHYSICIAN:  Katy Fitch. Sypher, M.D. DATE OF BIRTH:  Aug 11, 1945   DATE OF PROCEDURE:  01/16/2005  DATE OF DISCHARGE:                                 OPERATIVE REPORT   PREOPERATIVE DIAGNOSIS:  Painful mass right long finger overlying the A2  pulley.   POSTOPERATIVE DIAGNOSIS:  A2 pulley ganglion cyst.   OPERATIONS:  Excision of A2 pulley ganglion cyst right long finger.   OPERATING SURGEON:  Katy Fitch. Sypher, M.D.   ASSISTANT:  Molly Maduro Dasnoit PA-C.   ANESTHESIA:  Marcaine 25% and 2% lidocaine metacarpal head level block of  right long finger supplemented by IV sedation.   SUPERVISING ANESTHESIOLOGIST:  Dr. Gypsy Balsam.   INDICATIONS:  [name] is a 65 year old woman referred for  evaluation and management of a painful right long finger. She had a history  of a mass developing along the ulnar aspect of her right long finger palmar  surface overlying the A2 pulley. This was consistent with a probable flexor  sheath ganglion.   Due to a failure to respond to nonoperative measures, she is brought to the  operating room at this time for excision of her flexor sheath ganglion.   PROCEDURE:  [name] is brought to the operating room and placed in  supine position upon the operating table.   Following light sedation, the right arm was prepped with Betadine soap and  solution, sterilely draped. Marcaine 25% and 2% lidocaine were infiltrated  at metacarpal head level to obtain a digital block.   When anesthesia was satisfactory, the arm was exsanguinated with an Esmarch  bandage and arterial tourniquet on the proximal brachium inflated to 220  mmHg.   Procedure commenced with a short oblique incision directly over the mass.  Subcutaneous tissues were carefully divided revealing the cyst. This  was  circumferentially dissected. The ulnar neurovascular bundle was gently  retracted with a right-angle retractor. The cyst was excised off of the  flexor sheath and a rongeur was used to clean several cell layers into the  annular pulley.   The wound was then repaired with mattress suture of 5-0 nylon.   There were no apparent complications. [name] tolerated the surgery and  anesthesia well. She was transferred to the recovery room with stable vital  signs.       RVS/MEDQ  D:  01/16/2005  T:  01/16/2005  Job:  [id number]

## 2010-12-20 NOTE — Consult Note (Signed)
NAME:  Bailey, Donna            ACCOUNT NO.:  [id number]   MEDICAL RECORD NO.:  [id number]          PATIENT TYPE:  MAT   LOCATION:  MATC                          FACILITY:  [facility]   PHYSICIAN:  Richardean Sale, M.D.   DATE OF BIRTH:  1946-06-16   DATE OF CONSULTATION:  12/29/2005  DATE OF DISCHARGE:                                   CONSULTATION   CHIEF COMPLAINT:  Right lower quadrant pain and yellow vaginal discharge  with odor, status post hysterectomy.   HISTORY OF PRESENT ILLNESS:  This is a 65 year old female who is status post  vaginal hysterectomy with anterior and posterior repair, McCall's  culdoplasty, and transobturator sling placement on Dec 22, 2005, who noticed  today that she is having increasing right lower quadrant pain that is a 7/10  without medication and a 4/10 with the use of Vicodin.  She has also noticed  the smell of foul-smelling vaginal discharge.  Her discharge is increased.  It is thicker and now has a yellowish color.  She reports a temperature of  99.2 at home, no chills or sweats.  She has had a little bit of dysuria but  no hematuria, no trouble with her bowels, no urinary hesitancy, and no  nausea or vomiting.  The patient originally presented to urgent care earlier  this morning, where she had a urinalysis performed which was negative per  the physician at that facility.   PAST MEDICAL HISTORY:  Appendectomy.   PAST OBSTETRIC HISTORY:  Gravida 1 para 0, SAB x1.   FAMILY HISTORY:  Positive for breast cancer, leukemia, diabetes, myocardial  infarction, hypertension.   MEDICATIONS:  Nexium, Vicodin, and allergy shots.  Also currently on  Zithromax for an upper respiratory infection, last dose of medication given  on Dec 27, 2005.   ALLERGIES:  1.  PENICILLIN causes a rash.  2.  SULFA causes shortness of breath and hives.  3.  LEVAQUIN causes her heart to race.  4.  She is unable to take ANYTHING MADE FROM A MOLD.   PHYSICAL EXAMINATION:   VITAL SIGNS:  Temperature 97.6, pulse 60,  respirations 21, blood pressure 131/59.  GENERAL:  She is a well-developed, well-nourished white female who appears  in no acute distress.  HEART:  Regular rate and rhythm.  LUNGS:  Clear to auscultation bilaterally.  ABDOMEN:  Soft.  Mildly tender to palpation in the right lower quadrant.  There is no hepatosplenomegaly.  No palpable masses.  No CVA tenderness.  No  suprapubic tenderness.  PELVIC:  The external genitalia appear within normal limits.  The right  transobturator sling incision is erythematous, with a yellowish exudate.  Sterile speculum exam:  Exam is compromised by the patient's recent vaginal  surgery.  There is some yellow discharge present in the vault.  No odor  noted.  The sutures appear intact.  Wet prep was obtained.   Complete metabolic panel is normal, with the exception of a glucose of 130,  and albumin 3.4.  CBC is within normal limits.  White count 6.5, hemoglobin  13.8.  Urinalysis negative as reported at [facility].   ASSESSMENT:  A 65 year old white female who is postop day 7 status post  vaginal hysterectomy, anterior and posterior repair, McCall's culdoplasty,  and transobturator sling placement, who presents now with right lower  quadrant pain that radiates to the flank and increased vaginal discharge as  well as drainage from the right groin incision.   PLAN:  1.  Wet prep obtained.  Will await results.  2.  Will check CT scan of the abdomen and pelvis to evaluate for any      ureteral injury or any abscess formation.  3.  Wound culture obtained from the right groin incision.  Pending above      results, will start antibiotics for wound infection.      Richardean Sale, M.D.  Electronically Signed     JW/MEDQ  D:  12/29/2005  T:  12/29/2005  Job:  [id number]

## 2010-12-20 NOTE — Op Note (Signed)
NAME:  Donna Bailey, Donna Bailey            ACCOUNT NO.:  [id number]   MEDICAL RECORD NO.:  [id number]          PATIENT TYPE:  AMB   LOCATION:  DAY                          FACILITY:  [facility]   PHYSICIAN:  Lenoard Aden, M.D.DATE OF BIRTH:  1946/04/01   DATE OF PROCEDURE:  12/22/2005  DATE OF DISCHARGE:                                 OPERATIVE REPORT   PREOPERATIVE DIAGNOSIS:  Symptomatic pelvic relaxation with grade 2 uterine  descensus, grade 2 cystocele, grade 2 rectocele and enterocele, perineal  relaxation.   POSTOPERATIVE DIAGNOSIS:  Symptomatic pelvic relaxation with grade 2 uterine  descensus, grade 2 cystocele, grade 2 rectocele and enterocele, perineal  relaxation.   OPERATION PERFORMED:  Total vaginal hysterectomy, anterior colporrhaphy,  posterior colporrhaphy, enterocele repair, McCall culdoplasty,  perineorrhaphy.   SURGEON:  Lenoard Aden, M.D.   ASSISTANT:  Pershing Cox, M.D.  Suburethral sling placed by Dr.  Isabel Caprice, separately dictated note.   ANESTHESIA:  General by Hezzie Bump. Rose, M.D.   ESTIMATED BLOOD LOSS:  100 mL.   COMPLICATIONS:  None.   SPECIMENS:  Uterus and cervix to pathology.   Patient to recovery in good condition.   DESCRIPTION OF PROCEDURE:  After being apprised of the risks of anesthesia,  infection and bleeding, injury to bowel and bladder and possible need for  repair, patient was brought to the operating room.  She was administered  general anesthetic without complications, prepped and draped in the usual  sterile fashion.  Foley catheter placed.  After achieving adequate  anesthesia, dilute Xylocaine and epinephrine solution placed at  cervicovaginal junction.  This area was scored circumferentially using  electrocautery.  Anterior vaginal mucosa was undermined anteriorly about 2  cm and then the posterior cul-de-sac was entered atraumatically using sharp  dissection.  Anterior cul-de-sac was entered sharply using sharp  dissection.  Retractors were placed.  Uterosacral ligaments were bilaterally transfixed  using 0 plain sutures and transfixed to the vaginal cuff.  Uterine vessels  were identified, cauterized using the LigaSure, progressive bites off the  broad and tubo-ovarian and round ligament complexes were taken using the  LigaSure, specimen removed.  All pedicles appeared hemostatic, and McCall  culdoplasty placed internally and externally using a 0 Vicryl suture.  At  this time the vaginal cuff was closed top to bottom leaving a small portion  open for the anterior repair.  At this time the perineum was undermined  using sharp dissection after dilute Xylocaine solution was placed.  The  posterior vaginal mucosa was undermined into the vaginal rectal space  identifying the apical portion of the rectocele by nature of a rectal exam.  This area was grasped using an Allis clamp and then the vaginal mucosa was  dissected sharply off the rectal fascia.  Space was entered.  Rectocele was  reduced using 0 Vicryl pursestring sutures.  The defect was closed using 0  Vicryl sutures in an interrupted fashion and then a 20 Vicryl used to  perform a perineorrhaphy in the standard fashion.  Good hemostasis was  noted.  Rectum was intact after exam afterwards.  The pubovesical cervical  fascia was then exposed undermining the anterior vaginal mucosa, this was  dissected sharply off the anterior vaginal mucosa and the cystocele was  reduced.  Two pursestring sutures of 0 Vicryl were placed to reduce the  cystocele.  and the vaginal mucosa was trimmed and closed using interrupted  0 Vicryl sutures.  The uterosacral ligaments were then plicated in the  midline.  A small space was left in the anterior portion of the anterior  colporrhaphy for placement of a pubourethral sling.  At this time good  hemostasis was noted and the procedure was turned over to Dr. Isabel Caprice. The  patient is proceeding in good  condition.      Lenoard Aden, M.D.  Electronically Signed     RJT/MEDQ  D:  12/22/2005  T:  12/22/2005  Job:  [id number]

## 2010-12-20 NOTE — H&P (Signed)
NAME:  Donna Bailey, Donna Bailey            ACCOUNT NO.:  [id number]   MEDICAL RECORD NO.:  [id number]          PATIENT TYPE:  AMB   LOCATION:  DAY                          FACILITY:  [facility]   PHYSICIAN:  Lenoard Aden, M.D.DATE OF BIRTH:  [birth year]   DATE OF ADMISSION:  12/22/2005  DATE OF DISCHARGE:                                HISTORY & PHYSICAL   CHIEF COMPLAINT:  Symptomatic pelvic relaxation with cystocele, rectocele,  enterocele, and uterine prolapse.   HISTORY OF PRESENT ILLNESS:  She is a 65 year old white female, G1, P0, who  presents with aforementioned symptoms. She is a nonsmoker, nondrinker.  She  denies domestic physical violence.  She is up to date on colonoscopy and Pap  smears.  She has allergies to PENICILLIN and SULFA.   MEDICATIONS:  Include Nexium.   PAST MEDICAL HISTORY:  Remarkable for appendicitis with appendectomy and no  other surgical or medical hospitalizations.  She does give a history of one  first trimester uncomplicated abortion.   FAMILY HISTORY:  She has a family history of breast cancer, leukemia,  diabetes, myocardial infarction, and hypertension.   PHYSICAL EXAMINATION:  GENERAL: She is a well-developed, well-nourished  white female in no acute distress.  HEENT:  Normal.  LUNGS: Clear.  HEART: Regular rate and rhythm.  ABDOMEN:  Soft, obese, and nontender.  PELVIC:  Exam reveals grade 2 cystocele, grade 1 to 2 uterine descensus, and  a grade 2 rectocele with proximal enterocele.  Rectal exam otherwise  negative.  Colonoscopy, as noted, up to date.  There are no adnexal masses  on examination.   Ultrasound previously done reveals very small uterine fibroids and  bilaterally normal adnexa.   IMPRESSION:  Symptomatic pelvic relaxation with symptomatic uterine  prolapse, urgency, difficulty defecating.  Rectocele, cystocele, and  probable enterocele.  The patient desires ovarian conservation.  She has  proceeded with urodynamics per Dr.  Isabel Caprice and will be consented in addition  for periurethral sling.   PLAN:  Proceed with a total vaginal hysterectomy with ovarian conservation,  cystocele repair, rectocele repair, and enterocele repair.  Risks of  anesthesia, infection, bleeding, injury to abdominal organs with need for  repair discussed.  Discussed immediate complications to include bowel and  bladder injury noted.  The patient acknowledges and wishes to proceed.  The  patient will be consented separately for periurethral sling per Dr. Isabel Caprice.      Lenoard Aden, M.D.  Electronically Signed     RJT/MEDQ  D:  12/21/2005  T:  12/21/2005  Job:  [id number]   cc:   [facility]

## 2010-12-20 NOTE — Discharge Summary (Signed)
NAME:  Donna Bailey, Donna Bailey            ACCOUNT NO.:  [id number]   MEDICAL RECORD NO.:  [id number]          PATIENT TYPE:  INP   LOCATION:  1611                         FACILITY:  [facility]   PHYSICIAN:  Valetta Fuller, M.D.  DATE OF BIRTH:  08/13/1945   DATE OF ADMISSION:  12/22/2005  DATE OF DISCHARGE:  12/24/2005                                 DISCHARGE SUMMARY   DISCHARGE DIAGNOSIS:  1. Uterine prolapse.  2. Stress incontinence.  3. Endometriosis.   PROCEDURES PERFORMED:  Total vaginal hysterectomy with anterior and  posterior repair and enterocele repair, McCall culdoplasty and  perineorrhaphy by Dr. Ocie Cornfield  1. Transobturator suburethral sling by Dr. Barron Alvine.   HOSPITAL COURSE:  [place] had been evaluated by Dr. Billy Coast and myself  because of significant global pelvic prolapse.  The patient had significant  cystocele, rectocele and uterine prolapse.  We felt that the patient needed  urinary dynamics which were done.  With packing of her prolapse, she did  have unmasked urinary stress incontinence and, therefore, we recommended  that she have a concurrent anti-incontinence procedure at the time of her  hysterectomy and anterior and posterior repair.  Because of her severe  prolapse, Dr. Billy Coast had recommended vaginal hysterectomy along with the  repair as listed above.  That surgery was done on Dec 22, 2005, without  difficulty or complication.   The patient complained of moderate discomfort status post the procedure.  She was stable throughout the night with a postoperative hemoglobin of 11.5.  The patient was initially unable to void.  She had a bladder scan which  showed about 240 mL, and  a Foley catheter was replaced.  The patient was  kept for additional assessment for 24 hours.  She remained afebrile and took  p.o. well.  Her only complaint was a sore throat.  She had no vaginal  bleeding.  She had a second voiding trial on postoperative day #2  and was  able to  void.   DISPOSITION:  The patient was sent home with some pain medication and  medicines as previously prescribed.  She will follow up in our office  approximately 5-7 days status post surgery. Dr. Jorene Minors office was have to  contact the patient to set up followup from his perspective.           ______________________________  Valetta Fuller, M.D.  Electronically Signed     DSG/MEDQ  D:  03/08/2006  T:  03/08/2006  Job:  [id number]   cc:   Lenoard Aden, M.D.  Fax: [phone]

## 2011-03-25 ENCOUNTER — Other Ambulatory Visit: Payer: Medicare Other

## 2011-03-25 ENCOUNTER — Ambulatory Visit (INDEPENDENT_AMBULATORY_CARE_PROVIDER_SITE_OTHER): Payer: Medicare Other | Admitting: Internal Medicine

## 2011-03-25 ENCOUNTER — Encounter: Payer: Self-pay | Admitting: Internal Medicine

## 2011-03-25 VITALS — BP 130/82 | HR 79 | Ht 67.5 in | Wt 177.0 lb

## 2011-03-25 DIAGNOSIS — J309 Allergic rhinitis, unspecified: Secondary | ICD-10-CM

## 2011-03-25 DIAGNOSIS — K9089 Other intestinal malabsorption: Secondary | ICD-10-CM

## 2011-03-25 DIAGNOSIS — K9049 Malabsorption due to intolerance, not elsewhere classified: Secondary | ICD-10-CM | POA: Insufficient documentation

## 2011-03-25 NOTE — Assessment & Plan Note (Signed)
She has varied and nonobjective impressions about varius foods. IgE panel was negative. We will send IgG panel, but again suggested food diary and avoid foods that clearly cause symptoms.
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
## 2011-03-25 NOTE — Patient Instructions (Addendum)
Order- lab- Food IgG panel    [id number]             Dx food intolerance.

## 2011-03-30 NOTE — Assessment & Plan Note (Signed)
Minimal symptoms now. We discussed expectations with Fall weather and use of antihistamines.

## 2011-04-04 ENCOUNTER — Telehealth: Payer: Self-pay | Admitting: *Deleted

## 2011-04-04 NOTE — Telephone Encounter (Signed)
Per CY-at this time okay to leave those tests off. I spoke with lab and they are aware of this.

## 2011-05-21 ENCOUNTER — Other Ambulatory Visit: Payer: Self-pay | Admitting: Family Medicine

## 2011-05-21 ENCOUNTER — Ambulatory Visit
Admission: RE | Admit: 2011-05-21 | Discharge: 2011-05-21 | Disposition: A | Discharge status: Home / Self Care | Payer: Medicare Other | Source: Ambulatory Visit | Attending: Family Medicine | Admitting: Family Medicine

## 2011-05-27 ENCOUNTER — Telehealth: Payer: Self-pay | Admitting: Internal Medicine

## 2011-05-27 ENCOUNTER — Other Ambulatory Visit: Payer: Self-pay | Admitting: Family Medicine

## 2011-05-27 ENCOUNTER — Ambulatory Visit
Admission: RE | Admit: 2011-05-27 | Discharge: 2011-05-27 | Disposition: A | Discharge status: Home / Self Care | Payer: Medicare Other | Source: Ambulatory Visit | Attending: Family Medicine | Admitting: Family Medicine

## 2011-05-27 DIAGNOSIS — R109 Unspecified abdominal pain: Secondary | ICD-10-CM

## 2011-05-27 MED ORDER — IOHEXOL 300 MG/ML  SOLN
100.0000 mL | Freq: Once | INTRAMUSCULAR | Status: AC | PRN
  Administered 2011-05-27: 100 mL via INTRAVENOUS

## 2011-05-27 NOTE — Telephone Encounter (Signed)
Titrate miralax to achieve a daily bm or 2.

## 2011-05-27 NOTE — Telephone Encounter (Signed)
Pt was last seen by Dr. Marina Goodell for OV 05/15/10 for blood in her stool. Colon was done 05/16/10, 2 polyps were removed and were benign. Colon recall for 5 years. Pt is complaining of constipation. States that last Wed she had bad diarrhea, she did not take anything for the diarrhea. She states she has had no BM to speak of since then. She reports seeing her primary care doc last week and going to the Sunday walk-in clinic. She states she used an enema on Friday and only had "little raisin shaped stool." She used another enema Sunday morning and states she had mostly mucous. She was told at the Sunday walk-in clinic to use miralax on Sunday and Monday and x-rays were done according to the pt. Abdominal ultrasound was done 05/21/11, there were no acute findings. She states she has increased her fiber and water intake. She was told not to use any more enemas by the clinic. She states that she is very uncomfortable. Dr. Marina Goodell please advise.

## 2011-05-27 NOTE — Telephone Encounter (Signed)
Pt is calling back wanting to schedule an appt to be seen by Dr. Marina Goodell for her constipation. Pt scheduled to see Dr. Marina Goodell 06/03/11@11 :15am. Pt aware of appt date and time.

## 2011-05-27 NOTE — Telephone Encounter (Signed)
Pt aware. She was instructed to call back if she continues to have problems.

## 2011-06-03 ENCOUNTER — Ambulatory Visit (INDEPENDENT_AMBULATORY_CARE_PROVIDER_SITE_OTHER): Payer: Medicare Other | Admitting: Internal Medicine

## 2011-06-03 ENCOUNTER — Encounter: Payer: Self-pay | Admitting: Internal Medicine

## 2011-06-03 VITALS — BP 120/78 | HR 72 | Ht 66.0 in | Wt 177.0 lb

## 2011-06-03 DIAGNOSIS — R198 Other specified symptoms and signs involving the digestive system and abdomen: Secondary | ICD-10-CM

## 2011-06-03 DIAGNOSIS — K4090 Unilateral inguinal hernia, without obstruction or gangrene, not specified as recurrent: Secondary | ICD-10-CM

## 2011-06-03 DIAGNOSIS — Z86010 Personal history of colonic polyps: Secondary | ICD-10-CM

## 2011-06-03 DIAGNOSIS — K219 Gastro-esophageal reflux disease without esophagitis: Secondary | ICD-10-CM

## 2011-06-03 DIAGNOSIS — K2270 Barrett's esophagus without dysplasia: Secondary | ICD-10-CM

## 2011-06-03 MED ORDER — ESOMEPRAZOLE MAGNESIUM 40 MG PO CPDR: 40.0000 mg | DELAYED_RELEASE_CAPSULE | Freq: Every day | ORAL | Status: DC

## 2011-06-03 MED ORDER — POLYETHYLENE GLYCOL 3350 17 G PO PACK: 17.0000 g | PACK | Freq: Every day | ORAL | Status: DC

## 2011-06-03 MED ORDER — ESOMEPRAZOLE MAGNESIUM 40 MG PO CPDR: 40.0000 mg | DELAYED_RELEASE_CAPSULE | Freq: Two times a day (BID) | ORAL | Status: DC
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
## 2011-06-03 NOTE — Patient Instructions (Signed)
Nexium  And Miralax sent to your pharmacy. Appointment with Dr. Magnus Ivan at [facility] has already been made for 06/24/11 10:30 am.  [employer] card, Picture I.D., list of meds, and copay with you at your visit.

## 2011-06-16 ENCOUNTER — Ambulatory Visit (INDEPENDENT_AMBULATORY_CARE_PROVIDER_SITE_OTHER): Payer: Medicare Other | Admitting: General Surgery

## 2011-06-24 ENCOUNTER — Ambulatory Visit (INDEPENDENT_AMBULATORY_CARE_PROVIDER_SITE_OTHER): Payer: Medicare Other | Admitting: Surgery

## 2011-07-03 ENCOUNTER — Ambulatory Visit (INDEPENDENT_AMBULATORY_CARE_PROVIDER_SITE_OTHER): Payer: Medicare Other | Admitting: General Surgery

## 2011-07-03 ENCOUNTER — Encounter (INDEPENDENT_AMBULATORY_CARE_PROVIDER_SITE_OTHER): Payer: Self-pay | Admitting: General Surgery

## 2011-07-03 VITALS — BP 120/72 | HR 82 | Temp 98.2°F | Resp 16 | Ht 66.5 in | Wt 173.6 lb

## 2011-07-03 DIAGNOSIS — R1032 Left lower quadrant pain: Secondary | ICD-10-CM

## 2011-07-03 NOTE — Patient Instructions (Signed)
Call us in symptoms worsened and otherwise we will recheck in 4 weeks.

## 2011-07-03 NOTE — Progress Notes (Signed)
Subjective:   Pain and lump left groin  Patient ID: Donna Bailey, female   DOB: [birth year], 65 y.o.   MRN: [id number]  HPI Patient is a 65 year old female referred through the courtesy of Dr. Marina Goodell for evaluation of left groin discomfort and a lump, possible inguinal hernia. The patient states that the onset was about a month ago. She was also constipated at that time. She felt a tender hard lump in her left groin. She saw Dr. Marina Goodell and a CT scan was obtained. This shows a known large hiatal hernia. The initial reading showed some apparent reactive lymphadenopathy in the left groin but no hernia. After Dr. Lamar Sprinkles exam he was concerned about an inguinal hernia and re\re reading of the CT indicates possible bilateral fat-containing inguinal hernia but this is supple. The patient states that with a bowel regimen with MiraLax the lump has improved but she still is able to feel a little something there and had some mild nagging discomfort and tenderness with palpation in the groin.  Review of Systems  Constitutional: Positive for fatigue.  Gastrointestinal: Positive for constipation and abdominal distention.  Musculoskeletal: Positive for myalgias and arthralgias.       Objective:   Physical Exam General: Moderately obese Caucasian female in no acute distress Skin: Warm and dry without rash or infection Lymph nodes: See abdominal and groin exam below Lungs: Clear without increased work of breathing Cardiovascular: Regular rate and rhythm without murmur Abdomen: Obese. Well-healed right lower quadrant appendectomy scar and laparoscopic incisions. The patient lying flat in the left groin there is a tender approximately 2 cm freely movable mass in the low inguinal area was does not appear attached to or arising from the muscular layer. With the patient coughing and doing Valsalva maneuver standing I do not feel any definite reducible inguinal hernia. Abdomen is generally soft and  nontender Extremities: No edema. No infection or skin lesions of left lower extremity neurologic: Alert and fully oriented. Gait normal    Assessment:     Left groin discomfort and a palpable mass. The patient states this mass has definitely gone down over the last several weeks. By my exam this seems much more consistent with an enlarged lymph node than an inguinal hernia. Her CT scan did not initially show a normal hernia. The reading indicates "a small fat-containing hernias bilaterally". I'm not sure that this is a significant finding. I suspect that she has an enlarged lymph node in the left groin that is improving.  I discussed all this with the patient and her husband. All recommend observation for now I will see her back in 4 weeks. If she has a persistent tender mass in this area she may require groin exploration with probable lymph node excision and we could look for hernia at that time. I'll see her back in 4 weeks.    Plan:     Return in 4 weeks for repeat examination. She is to call sooner if her symptoms worsen.

## 2011-07-04 ENCOUNTER — Encounter: Payer: Self-pay | Admitting: Internal Medicine

## 2011-07-04 ENCOUNTER — Ambulatory Visit (INDEPENDENT_AMBULATORY_CARE_PROVIDER_SITE_OTHER): Payer: Medicare Other | Admitting: Internal Medicine

## 2011-07-04 VITALS — BP 130/90 | HR 71 | Ht 67.5 in | Wt 176.4 lb

## 2011-07-04 DIAGNOSIS — J209 Acute bronchitis, unspecified: Secondary | ICD-10-CM

## 2011-07-04 MED ORDER — AZITHROMYCIN 250 MG PO TABS: ORAL_TABLET | ORAL | Status: AC

## 2011-07-04 MED ORDER — METHYLPREDNISOLONE ACETATE 80 MG/ML IJ SUSP
80.0000 mg | Freq: Once | INTRAMUSCULAR | Status: AC
  Administered 2011-07-04: 80 mg via INTRAMUSCULAR
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
## 2011-07-04 NOTE — Patient Instructions (Signed)
Depo 80  Script sent for Z pak  Extra fluids

## 2011-07-04 NOTE — Assessment & Plan Note (Signed)
Upper respiratory infection with bronchitis. This began as a viral syndrome. There is probably some eustachian dysfunction as well. We discussed options, starting with fluids rest and typical supportive care. Plan-Depo-Medrol injection, Z-Pak.

## 2011-07-24 ENCOUNTER — Telehealth: Payer: Self-pay | Admitting: Internal Medicine

## 2011-07-24 DIAGNOSIS — E7800 Pure hypercholesterolemia, unspecified: Secondary | ICD-10-CM

## 2011-07-24 NOTE — Telephone Encounter (Signed)
Reviewed note with Dr. Marina Goodell and Dr. Marina Goodell does not feel that the pt needs to see another primary care md. Dr. Marina Goodell feels that she needs to follow-up with Dr. Johna Sheriff. Pt aware.

## 2011-07-24 NOTE — Telephone Encounter (Signed)
Ok to refer to Dr Laury Axon per patient request. Not emergent.

## 2011-07-24 NOTE — Telephone Encounter (Signed)
Called and spoke with pt. Pt would like to establish with a new PCP, Dr. Laury Axon at [facility].  She has called over there to schedule an appt but was told it would take several months before she can get an appt unless she got another physician to refer her.  Therefore, pt is asking if [facility] will send order for referal to establish with PCP.  Please advise.  Thanks.

## 2011-07-24 NOTE — Telephone Encounter (Signed)
What?

## 2011-07-24 NOTE — Telephone Encounter (Signed)
Last OV with Dr. Marina Goodell 06/03/11. Pt called and states that she was referred to Dr. Johna Sheriff by Dr. Marina Goodell for possible inguinal hernia. Pt saw him and he was not sure if the area she is referring to is a hernia or an enlarged lymph node. Pt is calling wanting Dr. Marina Goodell to refer her to Dr. Laury Axon. Pt states that this doctor is an OD and she thinks they may be able to help her better. States she is allergic to corn and wheat and they are used a lot as fillers in medications and that an OD might be able to manage her better. Dr. Marina Goodell please advise.

## 2011-07-25 ENCOUNTER — Encounter: Payer: Self-pay | Admitting: Internal Medicine

## 2011-07-25 NOTE — Telephone Encounter (Signed)
Pt aware referral order placed and someone should call her regarding this appt. Used dx of hypercholesterolemia. Pt wanted referral for her immune system.

## 2011-08-06 ENCOUNTER — Telehealth: Payer: Self-pay | Admitting: Internal Medicine

## 2011-08-06 NOTE — Telephone Encounter (Signed)
Spoke with pt and scheduled her for previsit 08/07/11@9am , EGD scheduled for 08/14/11@8am . Pt aware of appt dates and times. Pt questioned if she needed a colon. Reviewed previous colon report and pt is due for colon recall in 2016. Pt aware.

## 2011-08-07 ENCOUNTER — Ambulatory Visit (AMBULATORY_SURGERY_CENTER): Payer: Medicare Other | Admitting: *Deleted

## 2011-08-07 VITALS — Ht 66.5 in | Wt 171.5 lb

## 2011-08-07 DIAGNOSIS — K2270 Barrett's esophagus without dysplasia: Secondary | ICD-10-CM

## 2011-08-08 ENCOUNTER — Other Ambulatory Visit (INDEPENDENT_AMBULATORY_CARE_PROVIDER_SITE_OTHER): Payer: Self-pay | Admitting: General Surgery

## 2011-08-08 ENCOUNTER — Encounter (INDEPENDENT_AMBULATORY_CARE_PROVIDER_SITE_OTHER): Payer: Self-pay | Admitting: General Surgery

## 2011-08-08 ENCOUNTER — Ambulatory Visit (INDEPENDENT_AMBULATORY_CARE_PROVIDER_SITE_OTHER): Payer: Medicare Other | Admitting: General Surgery

## 2011-08-08 VITALS — BP 126/78 | HR 68 | Temp 97.8°F | Resp 16 | Ht 66.5 in | Wt 171.2 lb

## 2011-08-08 DIAGNOSIS — R1900 Intra-abdominal and pelvic swelling, mass and lump, unspecified site: Secondary | ICD-10-CM

## 2011-08-08 DIAGNOSIS — R1909 Other intra-abdominal and pelvic swelling, mass and lump: Secondary | ICD-10-CM
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
## 2011-08-14 ENCOUNTER — Encounter: Payer: Self-pay | Admitting: Internal Medicine

## 2011-08-14 ENCOUNTER — Ambulatory Visit (AMBULATORY_SURGERY_CENTER): Payer: Medicare Other | Admitting: Internal Medicine

## 2011-08-14 ENCOUNTER — Telehealth: Payer: Self-pay | Admitting: Internal Medicine

## 2011-08-14 VITALS — BP 153/81 | HR 68 | Temp 98.3°F | Resp 20 | Ht 66.0 in | Wt 171.0 lb

## 2011-08-14 DIAGNOSIS — D131 Benign neoplasm of stomach: Secondary | ICD-10-CM

## 2011-08-14 DIAGNOSIS — K2270 Barrett's esophagus without dysplasia: Secondary | ICD-10-CM

## 2011-08-14 MED ORDER — ESOMEPRAZOLE MAGNESIUM 40 MG PO CPDR: 40.0000 mg | DELAYED_RELEASE_CAPSULE | Freq: Three times a day (TID) | ORAL | Status: DC

## 2011-08-14 MED ORDER — SODIUM CHLORIDE 0.9 % IV SOLN: 500.0000 mL | INTRAVENOUS | Status: DC

## 2011-08-14 NOTE — Patient Instructions (Signed)
Discharge instructions given with verbal understanding. Handouts on esophagitis, and a hiatal hernia given. Resume previous medications. Recall for endoscopy in 3 months placed by Andrey Campanile RN.

## 2011-08-14 NOTE — Progress Notes (Signed)
On dc of rt ac iv, noted no bruising or swelling @ this site ,pressure held x approx 2 min. Noted bruising & swelling @ rt wrist / hand iv attempt site. With gauze dressing already on. ekg lead sites wiped w/ alcohol after leads taken off as requested by pt. Slight pinkness @ lead sites x4.Patient did not experience any of the following events: a burn prior to discharge; a fall within the facility; wrong site/side/patient/procedure/implant event; or a hospital transfer or hospital admission upon discharge from the facility. [phone]) Patient did not have preoperative order for IV antibiotic SSI prophylaxis. [phone])

## 2011-08-14 NOTE — Telephone Encounter (Signed)
Returned pts phone call.  Pt states that all iv sites except for the last successful insertion are swollen and painful. States that she has been using ice and it is not helping.  Explained to her that heat would be best and that she could use it off and on today.  Also suggested that she use Tylenol or Advil and pt stated that she "doesnt take anything for pain".  Told pt that we would call and f/u with her tomorrow morning and for her to call if symptoms worsen.

## 2011-08-14 NOTE — Op Note (Signed)
[facility] [address] [place], [place]  [address]  ENDOSCOPY PROCEDURE REPORT  PATIENT:  Donna Bailey, Donna Bailey  MR#:  [id number] BIRTHDATE:  [birth year], 65 yrs. old  GENDER:  female  ENDOSCOPIST:  Wilhemina Bonito. Eda Keys, MD Referred by:  Recall surviellance  PROCEDURE DATE:  08/14/2011 PROCEDURE:  EGD, diagnostic 43235 ASA CLASS:  Class II INDICATIONS:  h/o Barrett's Esophagus ; last exam 08-2008 no dysplasia  MEDICATIONS:   MAC sedation, administered by CRNA, propofol (Diprivan) 150 mg IV TOPICAL ANESTHETIC:  none  DESCRIPTION OF PROCEDURE:   After the risks benefits and alternatives of the procedure were thoroughly explained, informed consent was obtained.  The [facility] GIF-[id number] [id number] endoscope was introduced through the mouth and advanced to the second portion of the duodenum, without limitations.  The instrument was slowly withdrawn as the mucosa was fully examined. <<PROCEDUREIMAGES>>  Erosive Esophagitis was found in the distal esophagus as well as short segment Barrett's esophagus.  No bx due to acute esophagitis. An 8 cm hiatal hernia was found.  There were multiple fundic glang type polyps identified in the fundus/ body. Otherwise the examination was normal.    Retroflexed views revealed the hiatal hernia.    The scope was then withdrawn from the patient and the procedure completed.  COMPLICATIONS:  None  ENDOSCOPIC IMPRESSION: 1) Esophagitis in the distal esophagus 2) Barrett's esophagus in the distal esophagus (NO BX TODAY) 3) Hiatal hernia 4) Polyps, multiple in the fundus/body 5) Otherwise normal examination  RECOMMENDATIONS: 1) CONFIRM THAT PATIENT HAS BEEN TAKING NEXIUM 40MG  TWICE DAILY. IF NOT, DO SO. IF SO, INCREASE NEXIUM TO 40 MG TID  2) REPEAT EGD IN 3 MONTHS TO ASSESS FOR HEALING AND PERFORM SURVIELLANCE BIOPSIES  ______________________________ Wilhemina Bonito. Eda Keys, MD  CC:  Ancil Boozer MD; The Patient  n. eSIGNED:   Wilhemina Bonito. Eda Keys at  08/14/2011 08:41 AM  Threasa Heads, [phone]

## 2011-08-15 ENCOUNTER — Telehealth: Payer: Self-pay

## 2011-08-15 NOTE — Telephone Encounter (Signed)
Follow up Call- Patient questions:  Do you have a fever, pain , or abdominal swelling? no Pain Score  0 *  Have you tolerated food without any problems? yes  Have you been able to return to your normal activities? yes  Do you have any questions about your discharge instructions: Diet   no Medications  yes Follow up visit  no  Do you have questions or concerns about your Care? no  Actions: * If pain score is 4 or above: No action needed, pain <4. Pt. Stated that the swelling had gone down.  The bruising remains the same.  Pain scale is #2. The arm is sore, but she is okay.  Patient did have questions about her nexium.   Wanted to know if she could use another combination of drugs. Patient stated that nexium depletes the magnesium in her body.   Nurse will speak to Doctor and contact patient back with answer.

## 2011-08-15 NOTE — Telephone Encounter (Signed)
I spoke with Dr. Marina Goodell, and he said for the patient to stay on the recommended dose of nexium.  He also recommended that the patient see her PCP for magnesium level concerns.

## 2011-08-25 ENCOUNTER — Encounter (HOSPITAL_COMMUNITY): Payer: Self-pay | Admitting: Pharmacy Technician

## 2011-08-26 ENCOUNTER — Encounter: Payer: Self-pay | Admitting: Family Medicine

## 2011-08-26 ENCOUNTER — Ambulatory Visit (INDEPENDENT_AMBULATORY_CARE_PROVIDER_SITE_OTHER): Payer: Medicare Other | Admitting: Family Medicine

## 2011-08-26 VITALS — BP 124/80 | HR 70 | Temp 97.8°F | Ht 66.0 in | Wt 175.6 lb

## 2011-08-26 DIAGNOSIS — Z0000 Encounter for general adult medical examination without abnormal findings: Secondary | ICD-10-CM

## 2011-08-26 DIAGNOSIS — E785 Hyperlipidemia, unspecified: Secondary | ICD-10-CM

## 2011-08-26 DIAGNOSIS — B359 Dermatophytosis, unspecified: Secondary | ICD-10-CM

## 2011-08-26 DIAGNOSIS — K2270 Barrett's esophagus without dysplasia: Secondary | ICD-10-CM

## 2011-08-26 DIAGNOSIS — J309 Allergic rhinitis, unspecified: Secondary | ICD-10-CM

## 2011-08-26 DIAGNOSIS — B354 Tinea corporis: Secondary | ICD-10-CM

## 2011-08-26 MED ORDER — ZOSTER VACCINE LIVE 19400 UNT/0.65ML ~~LOC~~ SOLR: 0.6500 mL | Freq: Once | SUBCUTANEOUS | Status: AC

## 2011-08-26 MED ORDER — NAFTIFINE HCL 1 % EX CREA: TOPICAL_CREAM | Freq: Every day | CUTANEOUS | Status: DC

## 2011-08-26 NOTE — Assessment & Plan Note (Signed)
Pt has been to derm--- try naftin If no better f/u derm

## 2011-08-26 NOTE — Assessment & Plan Note (Signed)
Per Dr Young 

## 2011-08-26 NOTE — Progress Notes (Signed)
  Subjective:    Patient ID: Donna Bailey, female    DOB: Oct 24, 1945, 66 y.o.   MRN: [id number]  HPI Pt here to establish.   Pt has multiple issues.  She sees Dr Maple Hudson for allergies,  [facility] for recurrent uti and "kidney problems",  Dr Marina Goodell for GI. Pt has a corn allergy and can't take several meds because of it.  Pt has also been to integrative Dr. And Dr Alessandra Bevels.   Dr Alessandra Bevels told her she had a parasite but she did not know how to treat it because of her many allergies.   Pt has also been to a dermatologist for some skin rashes .   They did not get better but did not f/u Dr Nicholas Lose.    Review of Systems As above    Objective:   Physical Exam  Constitutional: She is oriented to person, place, and time. She appears well-developed and well-nourished.  Neck: Neck supple.  Cardiovascular: Normal rate, regular rhythm and normal heart sounds.   No murmur heard. Pulmonary/Chest: Effort normal and breath sounds normal. No respiratory distress. She has no wheezes. She has no rales. She exhibits no tenderness.  Neurological: She is alert and oriented to person, place, and time.  Skin:       + multiple circular lesions on hands and legs  Psychiatric: She has a normal mood and affect. Her behavior is normal. Judgment and thought content normal.          Assessment & Plan:

## 2011-08-26 NOTE — Patient Instructions (Signed)
Yeast Infection of the Skin Some yeast on the skin is normal, but sometimes it causes an infection. If you have a yeast infection, it shows up as white or light brown patches on brown skin. You can see it better in the summer on tan skin. It causes light-colored holes in your suntan. It can happen on any area of the body. This cannot be passed from person to person. HOME CARE  Scrub your skin daily with a dandruff shampoo. Your rash may take a couple weeks to get well.   Do not scratch or itch the rash.  GET HELP RIGHT AWAY IF:   You get another infection from scratching. The skin may get warm, red, and may ooze fluid.   The infection does not seem to be getting better.  MAKE SURE YOU:  Understand these instructions.   Will watch your condition.   Will get help right away if you are not doing well or get worse.  Document Released: 07/03/2008 Document Revised: 04/02/2011 Document Reviewed: 07/03/2008 Exit[employer] Patient Information 2012 [employer]. 

## 2011-08-26 NOTE — Assessment & Plan Note (Signed)
Per GI

## 2011-09-02 ENCOUNTER — Telehealth: Payer: Self-pay

## 2011-09-02 NOTE — Telephone Encounter (Signed)
Called pt to follow up on her complaint of pain at IV attempt site.  Pt states, "all sites have cleared up and bruising has cleared up".   Pt has follow up procedure in April and is concerned about being stuck numerous times again.  She asks for the "best IV person first" so she doesn't have to be stuck several times.  I encouraged the pt. to force fluids the day before and day of her procedure up until 2 hours prior to maintain hydration and help with IV placement.

## 2011-09-04 ENCOUNTER — Other Ambulatory Visit (HOSPITAL_COMMUNITY): Payer: Medicare Other

## 2011-09-24 ENCOUNTER — Ambulatory Visit: Payer: Medicare Other | Admitting: Internal Medicine

## 2011-09-24 ENCOUNTER — Encounter (INDEPENDENT_AMBULATORY_CARE_PROVIDER_SITE_OTHER): Payer: Medicare Other | Admitting: General Surgery

## 2011-09-30 ENCOUNTER — Encounter (HOSPITAL_COMMUNITY): Payer: Self-pay

## 2011-09-30 ENCOUNTER — Encounter (HOSPITAL_COMMUNITY)
Admission: RE | Admit: 2011-09-30 | Discharge: 2011-09-30 | Disposition: A | Discharge status: Home / Self Care | Payer: Medicare Other | Source: Ambulatory Visit | Attending: General Surgery | Admitting: General Surgery

## 2011-09-30 HISTORY — DX: Fibromyalgia: M79.7

## 2011-09-30 LAB — CBC
Hemoglobin: 14.7 g/dL (ref 12.0–15.0)
MCH: 31.1 pg (ref 26.0–34.0)
MCV: 90.7 fL (ref 78.0–100.0)
RBC: 4.73 MIL/uL (ref 3.87–5.11)

## 2011-09-30 NOTE — Pre-Procedure Instructions (Signed)
DIFFICULTY IV STICK

## 2011-09-30 NOTE — Patient Instructions (Addendum)
[name]  09/30/2011   Your procedure is scheduled on:  10/08/11  Report to [facility] at 6:30 AM.  Call this number if you have problems the morning of surgery: [phone]   Remember:   Do not eat food:After Midnight.  May have clear liquids: up to 4 Hrs. before arrival.( 6 HRS BEFORE SURGERY)  Clear liquids include soda, tea, black coffee, apple or grape juice, broth.  Take these medicines the morning of surgery with A SIP OF WATER: NEXIUM   Do not wear jewelry, make-up or nail polish.  Do not wear lotions, powders, or perfumes.   Do not shave underarms or legs 48 hours prior to surgery (men may shave face)  Do not bring valuables to the hospital.  Contacts, dentures or bridgework may not be worn into surgery.  Leave suitcase in the car. After surgery it may be brought to your room.  For patients admitted to the hospital, checkout time is 11:00 AM the day of discharge.   Patients discharged the day of surgery will not be allowed to drive home.  Name and phone number of your driver:   Special Instructions: CHG Shower Use Special Wash: 1/2 bottle night before surgery and 1/2 bottle morning of surgery.              STOP ASPIRIN AND HERBAL MEDICATIONS 5 DAYS PREOP   Please read over the following fact sheets that you were given: MRSA Information  [name]  09/30/2011

## 2011-10-08 ENCOUNTER — Encounter (HOSPITAL_COMMUNITY)
Admission: RE | Disposition: A | Discharge status: Home Health | Payer: Self-pay | Source: Ambulatory Visit | Attending: General Surgery

## 2011-10-08 ENCOUNTER — Encounter (HOSPITAL_COMMUNITY): Payer: Self-pay | Admitting: *Deleted

## 2011-10-08 ENCOUNTER — Ambulatory Visit (HOSPITAL_COMMUNITY)
Admission: RE | Admit: 2011-10-08 | Discharge: 2011-10-09 | Disposition: A | Discharge status: Home Health | Payer: Medicare Other | Source: Ambulatory Visit | Attending: General Surgery | Admitting: General Surgery

## 2011-10-08 ENCOUNTER — Ambulatory Visit (HOSPITAL_COMMUNITY): Payer: Medicare Other | Admitting: Anesthesiology

## 2011-10-08 ENCOUNTER — Encounter (HOSPITAL_COMMUNITY): Payer: Self-pay | Admitting: Anesthesiology

## 2011-10-08 DIAGNOSIS — K4090 Unilateral inguinal hernia, without obstruction or gangrene, not specified as recurrent: Secondary | ICD-10-CM

## 2011-10-08 DIAGNOSIS — Z01812 Encounter for preprocedural laboratory examination: Secondary | ICD-10-CM | POA: Insufficient documentation

## 2011-10-08 DIAGNOSIS — B354 Tinea corporis: Secondary | ICD-10-CM

## 2011-10-08 HISTORY — PX: INGUINAL HERNIA REPAIR: SHX194

## 2011-10-08 SURGERY — REPAIR, HERNIA, INGUINAL, ADULT
Anesthesia: General | Site: Groin | Laterality: Left | Wound class: Clean

## 2011-10-08 MED ORDER — PROPOFOL 10 MG/ML IV EMUL
INTRAVENOUS | Status: DC | PRN
  Administered 2011-10-08: 50 mg via INTRAVENOUS
  Administered 2011-10-08: 200 mg via INTRAVENOUS

## 2011-10-08 MED ORDER — ONDANSETRON HCL 4 MG PO TABS: 4.0000 mg | ORAL_TABLET | Freq: Three times a day (TID) | ORAL | Status: DC | PRN

## 2011-10-08 MED ORDER — LACTATED RINGERS IV SOLN: INTRAVENOUS | Status: DC

## 2011-10-08 MED ORDER — MIDAZOLAM HCL 5 MG/5ML IJ SOLN
INTRAMUSCULAR | Status: DC | PRN
  Administered 2011-10-08: 2 mg via INTRAVENOUS

## 2011-10-08 MED ORDER — POTASSIUM CHLORIDE IN NACL 20-0.45 MEQ/L-% IV SOLN
INTRAVENOUS | Status: DC
  Administered 2011-10-08 – 2011-10-09 (×2): via INTRAVENOUS
  Filled 2011-10-08 (×5): qty 1000

## 2011-10-08 MED ORDER — MORPHINE SULFATE 2 MG/ML IJ SOLN
2.0000 mg | INTRAMUSCULAR | Status: DC | PRN
  Administered 2011-10-08 – 2011-10-09 (×8): 2 mg via INTRAVENOUS
  Filled 2011-10-08 (×8): qty 1

## 2011-10-08 MED ORDER — POLYETHYLENE GLYCOL 3350 17 G PO PACK
17.0000 g | PACK | Freq: Every day | ORAL | Status: DC
  Administered 2011-10-08 – 2011-10-09 (×2): 17 g via ORAL
  Filled 2011-10-08 (×3): qty 1

## 2011-10-08 MED ORDER — MEPERIDINE HCL 50 MG/ML IJ SOLN: 6.2500 mg | INTRAMUSCULAR | Status: DC | PRN

## 2011-10-08 MED ORDER — LIDOCAINE HCL (CARDIAC) 20 MG/ML IV SOLN
INTRAVENOUS | Status: DC | PRN
  Administered 2011-10-08: 60 mg via INTRAVENOUS

## 2011-10-08 MED ORDER — ONDANSETRON HCL 4 MG/2ML IJ SOLN
INTRAMUSCULAR | Status: DC | PRN
  Administered 2011-10-08: 4 mg via INTRAVENOUS

## 2011-10-08 MED ORDER — HEPARIN SODIUM (PORCINE) 5000 UNIT/ML IJ SOLN
5000.0000 [IU] | Freq: Three times a day (TID) | INTRAMUSCULAR | Status: DC
  Administered 2011-10-09 (×2): 5000 [IU] via SUBCUTANEOUS
  Filled 2011-10-08 (×8): qty 1

## 2011-10-08 MED ORDER — PANTOPRAZOLE SODIUM 40 MG PO TBEC
40.0000 mg | DELAYED_RELEASE_TABLET | Freq: Every day | ORAL | Status: DC
  Filled 2011-10-08 (×2): qty 1

## 2011-10-08 MED ORDER — OXYCODONE HCL 5 MG PO TABS: 5.0000 mg | ORAL_TABLET | ORAL | Status: DC | PRN

## 2011-10-08 MED ORDER — BUPIVACAINE LIPOSOME 1.3 % IJ SUSP
20.0000 mL | INTRAMUSCULAR | Status: AC
  Administered 2011-10-08: 20 mL
  Filled 2011-10-08: qty 20

## 2011-10-08 MED ORDER — DIPHENHYDRAMINE HCL 25 MG PO CAPS
25.0000 mg | ORAL_CAPSULE | Freq: Four times a day (QID) | ORAL | Status: DC | PRN
  Filled 2011-10-08: qty 1

## 2011-10-08 MED ORDER — HYDROMORPHONE HCL PF 1 MG/ML IJ SOLN
0.2500 mg | INTRAMUSCULAR | Status: DC | PRN
  Administered 2011-10-08: 0.25 mg via INTRAVENOUS
  Administered 2011-10-08: 0.5 mg via INTRAVENOUS
  Administered 2011-10-08: 0.25 mg via INTRAVENOUS
  Administered 2011-10-08: 0.5 mg via INTRAVENOUS

## 2011-10-08 MED ORDER — VANCOMYCIN HCL IN DEXTROSE 1-5 GM/200ML-% IV SOLN
1000.0000 mg | INTRAVENOUS | Status: AC
  Administered 2011-10-08: 1000 mg via INTRAVENOUS

## 2011-10-08 MED ORDER — FENTANYL CITRATE 0.05 MG/ML IJ SOLN
INTRAMUSCULAR | Status: DC | PRN
  Administered 2011-10-08 (×6): 25 ug via INTRAVENOUS

## 2011-10-08 MED ORDER — EPHEDRINE SULFATE 50 MG/ML IJ SOLN
INTRAMUSCULAR | Status: DC | PRN
  Administered 2011-10-08 (×2): 5 mg via INTRAVENOUS

## 2011-10-08 MED ORDER — LACTATED RINGERS IV SOLN
INTRAVENOUS | Status: DC | PRN
  Administered 2011-10-08 (×2): via INTRAVENOUS

## 2011-10-08 MED ORDER — PROMETHAZINE HCL 25 MG/ML IJ SOLN: 6.2500 mg | INTRAMUSCULAR | Status: DC | PRN

## 2011-10-08 MED ORDER — ZOLPIDEM TARTRATE 10 MG PO TABS
10.0000 mg | ORAL_TABLET | Freq: Every day | ORAL | Status: DC
  Administered 2011-10-09: 10 mg via ORAL
  Filled 2011-10-08: qty 1

## 2011-10-08 MED ORDER — NAFTIFINE HCL 1 % EX CREA: TOPICAL_CREAM | Freq: Every day | CUTANEOUS | Status: DC

## 2011-10-08 MED ORDER — DIPHENHYDRAMINE HCL 50 MG/ML IJ SOLN
12.5000 mg | Freq: Four times a day (QID) | INTRAMUSCULAR | Status: DC | PRN
  Administered 2011-10-08 (×2): 12.5 mg via INTRAVENOUS
  Filled 2011-10-08 (×3): qty 1

## 2011-10-08 MED ORDER — 0.9 % SODIUM CHLORIDE (POUR BTL) OPTIME
TOPICAL | Status: DC | PRN
  Administered 2011-10-08: 1000 mL

## 2011-10-08 SURGICAL SUPPLY — 43 items
ADH SKN CLS APL DERMABOND .7 (GAUZE/BANDAGES/DRESSINGS) ×2
BLADE HEX COATED 2.75 (ELECTRODE) ×2 IMPLANT
BLADE SURG 15 STRL LF DISP TIS (BLADE) ×1 IMPLANT
BLADE SURG 15 STRL SS (BLADE) ×2
BLADE SURG SZ10 CARB STEEL (BLADE) ×1 IMPLANT
CANISTER SUCTION 2500CC (MISCELLANEOUS) ×2 IMPLANT
CLOTH BEACON ORANGE TIMEOUT ST (SAFETY) ×2 IMPLANT
DECANTER SPIKE VIAL GLASS SM (MISCELLANEOUS) IMPLANT
DERMABOND ADVANCED (GAUZE/BANDAGES/DRESSINGS) ×2
DERMABOND ADVANCED .7 DNX12 (GAUZE/BANDAGES/DRESSINGS) IMPLANT
DRAIN PENROSE 18X1/2 LTX STRL (DRAIN) ×1 IMPLANT
DRAPE INCISE IOBAN 66X45 STRL (DRAPES) ×2 IMPLANT
DRAPE LAPAROTOMY TRNSV 102X78 (DRAPE) ×2 IMPLANT
ELECT REM PT RETURN 9FT ADLT (ELECTROSURGICAL) ×2
ELECTRODE REM PT RTRN 9FT ADLT (ELECTROSURGICAL) ×1 IMPLANT
GLOVE BIOGEL PI IND STRL 7.0 (GLOVE) ×1 IMPLANT
GLOVE BIOGEL PI INDICATOR 7.0 (GLOVE) ×1
GOWN STRL NON-REIN LRG LVL3 (GOWN DISPOSABLE) ×2 IMPLANT
GOWN STRL REIN XL XLG (GOWN DISPOSABLE) ×4 IMPLANT
KIT BASIN OR (CUSTOM PROCEDURE TRAY) ×2 IMPLANT
MESH PARIETEX 5.2X3.6 (Mesh General) ×1 IMPLANT
NDL HYPO 25X1 1.5 SAFETY (NEEDLE) ×1 IMPLANT
NEEDLE HYPO 22GX1.5 SAFETY (NEEDLE) IMPLANT
NEEDLE HYPO 25X1 1.5 SAFETY (NEEDLE) ×2 IMPLANT
NS IRRIG 1000ML POUR BTL (IV SOLUTION) ×2 IMPLANT
PACK BASIC VI WITH GOWN DISP (CUSTOM PROCEDURE TRAY) ×2 IMPLANT
PENCIL BUTTON HOLSTER BLD 10FT (ELECTRODE) ×2 IMPLANT
SPONGE GAUZE 4X4 12PLY (GAUZE/BANDAGES/DRESSINGS) IMPLANT
SPONGE LAP 4X18 X RAY DECT (DISPOSABLE) ×2 IMPLANT
STRIP CLOSURE SKIN 1/2X4 (GAUZE/BANDAGES/DRESSINGS) IMPLANT
SUT MNCRL AB 4-0 PS2 18 (SUTURE) ×2 IMPLANT
SUT PROLENE 2 0 CT2 30 (SUTURE) ×4 IMPLANT
SUT SILK 2 0 SH (SUTURE) ×1 IMPLANT
SUT VIC AB 3-0 54XBRD REEL (SUTURE) ×1 IMPLANT
SUT VIC AB 3-0 BRD 54 (SUTURE) ×2
SUT VIC AB 3-0 CT1 27 (SUTURE)
SUT VIC AB 3-0 CT1 TAPERPNT 27 (SUTURE) ×1 IMPLANT
SUT VIC AB 3-0 SH 27 (SUTURE) ×2
SUT VIC AB 3-0 SH 27XBRD (SUTURE) IMPLANT
SYR BULB IRRIGATION 50ML (SYRINGE) ×2 IMPLANT
SYR CONTROL 10ML LL (SYRINGE) ×2 IMPLANT
TOWEL OR 17X26 10 PK STRL BLUE (TOWEL DISPOSABLE) ×2 IMPLANT
YANKAUER SUCT BULB TIP 10FT TU (MISCELLANEOUS) ×2 IMPLANT

## 2011-10-08 NOTE — Anesthesia Preprocedure Evaluation (Signed)
Anesthesia Evaluation    Airway Mallampati: II TM Distance: >3 FB Neck ROM: Full    Dental No notable dental hx.    Pulmonary  breath sounds clear to auscultation  Pulmonary exam normal       Cardiovascular Rhythm:Regular Rate:Normal     Neuro/Psych  Neuromuscular disease    GI/Hepatic GERD-  Medicated and Controlled,  Endo/Other    Renal/GU      Musculoskeletal  (+) Fibromyalgia -  Abdominal   Peds  Hematology   Anesthesia Other Findings Crowns.  Reproductive/Obstetrics                           Anesthesia Physical Anesthesia Plan  ASA: III  Anesthesia Plan: General   Post-op Pain Management:    Induction: Intravenous  Airway Management Planned:   Additional Equipment:   Intra-op Plan:   Post-operative Plan: Extubation in OR  Informed Consent: I have reviewed the patients History and Physical, chart, labs and discussed the procedure including the risks, benefits and alternatives for the proposed anesthesia with the patient or authorized representative who has indicated his/her understanding and acceptance.   Dental advisory given  Plan Discussed with: CRNA  Anesthesia Plan Comments:         Anesthesia Quick Evaluation

## 2011-10-08 NOTE — Anesthesia Postprocedure Evaluation (Signed)
  Anesthesia Post-op Note  [name]  Procedure(s) Performed: Procedure(s) (LRB): HERNIA REPAIR INGUINAL ADULT (Left)  Patient Location: PACU  Anesthesia Type: General  Level of Consciousness: awake and alert   Airway and Oxygen Therapy: Patient Spontanous Breathing  Post-op Pain: mild  Post-op Assessment: Post-op Vital signs reviewed, Patient's Cardiovascular Status Stable, Respiratory Function Stable, Patent Airway and No signs of Nausea or vomiting  Post-op Vital Signs: stable  Complications: No apparent anesthesia complications

## 2011-10-08 NOTE — H&P (Signed)
  Subjective:    Donna Bailey is a 66 y.o. female who presents with a several month Hx of a tender L groin mass Review of Systems A comprehensive review of systems was negative.    Objective:   BP 118/73  Pulse 67  Temp(Src) 98.4 F (36.9 C) (Oral)  Resp 18  SpO2 100%  General:  alert and cooperative Skin:  normal and no rash or abnormalities  Lymph Nodes:  Cervical, supraclavicular, and axillary nodes normal. Lungs:  clear to auscultation bilaterally Heart:  regular rate and rhythm Abdomen: soft, non-tender; bowel sounds normal; no masses,  no organomegaly 3-4 cm L groin mass C/W hernia vs lymphadenopathy Extremities:  extremities normal, atraumatic, no cyanosis or edema Neurologic:  negative Psychiatric:  normal mood, behavior, speech, dress, and thought processes    Assessment: Persistent L groin mass, hernia vs LN   Plan:L groin exploration, poss hernia repair   1. Discussed the risk of surgery,  and the risks of general anesthetic including MI, CVA, sudden death or even reaction to anesthetic medications. The patient understands the risks, any and all questions were answered to the patient's satisfaction.   Mariella Saa MD, FACS  10/08/2011, 8:20 AM

## 2011-10-08 NOTE — Op Note (Signed)
Preoperative Diagnosis: left groin mass  Postoprative Diagnosis: left groin mass, inguinal hernia  Procedure: Procedure(s): HERNIA REPAIR INGUINAL ADULT   Surgeon: Glenna Fellows T   Assistants: None  Anesthesia:  General LMA anesthesiaDiagnos  Indications:  Patient is a 66 year old female who presented several months ago with a persistent left groin mass. Examination revealed an approximately 3 cm soft slightly tender mass in the left inguinal region not completely reducible and possibly consistent with an inguinal hernia or lymphadenopathy. This has persisted and remains symptomatic I recommended proceeding with exploration of the area and probable hernia repair versus lymph node excision. We have discussed the possible diagnoses and treatments for each and risks of surgery including anesthetic complications, bleeding, infection, recurrent hernia. She is brought to the operating room for this procedure.   Procedure Detail: Patient was brought to the operating room, placed in supine position on the operating table and laryngeal mask general anesthesia induced. She received IV vancomycin preoperatively. The left groin was widely sterilely prepped and draped. Patient time out was performed and correct procedure verified. I made an oblique incision over the palpable abnormality in the left groin and dissection was carried down to the subcutaneous tissue and Scarpa's fascia using cautery. The external oblique inguinal ligament were identified and the external oblique divided along the lines of its fibers through the external ring. At this point within the inguinal canal was noted to be a good size lipoma and hernia sac protruding through a dilated internal ring. I clamped and divided the round ligament at the pubic tubercle. The round ligament, lipoma and hernia sac were then dissected free up to the level of the internal ring at which point it was clamped and ligated. This was sent for permanent  pathology. The dilated internal ring was then closed with a running 2-0 Prolene. A piece of Parietex mesh was then used and trimmed to size to fit the floor of the inguinal canal and extended out laterally well over the internal ring. It was sutured initially to the pubic tubercle and then medial to lateral along the inguinal ligament with a running 2-0 Prolene. Medially the mesh was sutured to the edge of the rectus sheath with interrupted 2-0 Prolene. This provided nice broad coverage of the direct and indirect spaces. I then used 20 cc of Exparel local anesthetic to infiltrate the soft tissues. Complete hemostasis was assured. The external oblique and Scarpa's fascia were closed with running 3-0 Vicryl in layers. The skin was closed with a running 4-0 Monocryl subcuticular stitch and Dermabond. Sponge needle and instrument counts were correct    Findings: As above  Estimated Blood Loss:  Minimal         Drains: none  Blood Given: none          Specimens: lipoma and hernia sac        Complications:  * No complications entered in OR log *         Disposition: PACU - hemodynamically stable.         Condition: stable  Mariella Saa MD, FACS  10/08/2011, 9:58 AM

## 2011-10-08 NOTE — Anesthesia Procedure Notes (Signed)
Procedure Name: LMA Insertion Date/Time: 10/08/2011 8:34 AM Performed by: Lurlean Leyden, Hurley Sobel L. Patient Re-evaluated:Patient Re-evaluated prior to inductionOxygen Delivery Method: Circle system utilized Preoxygenation: Pre-oxygenation with 100% oxygen Intubation Type: IV induction Ventilation: Mask ventilation without difficulty LMA: LMA with gastric port inserted LMA Size: 3.0 Number of attempts: 1 Placement Confirmation: breath sounds checked- equal and bilateral and positive ETCO2 Tube secured with: Tape Dental Injury: Teeth and Oropharynx as per pre-operative assessment

## 2011-10-08 NOTE — Transfer of Care (Signed)
Immediate Anesthesia Transfer of Care Note  [name]  Procedure(s) Performed: Procedure(s) (LRB): HERNIA REPAIR INGUINAL ADULT (Left)  Patient Location: PACU  Anesthesia Type: General  Level of Consciousness: awake, alert  and oriented  Airway & Oxygen Therapy: Patient Spontanous Breathing and Patient connected to face mask oxygen  Post-op Assessment: Report given to PACU RN and Post -op Vital signs reviewed and stable  Post vital signs: Reviewed and stable  Complications: No apparent anesthesia complications

## 2011-10-08 NOTE — Preoperative (Signed)
Beta Blockers   Reason not to administer Beta Blockers:Not Applicable 

## 2011-10-09 MED FILL — Bupivacaine Inj 0.25% w/ Epinephrine 1:200000 (PF): INTRAMUSCULAR | Qty: 30 | Status: AC

## 2011-10-09 NOTE — Discharge Summary (Signed)
   Patient ID: Donna Bailey [id number] 65 y.o. [birth year]  10/08/2011  Discharge date and time: 10/09/2011   Admitting Physician: Glenna Fellows T  Discharge Physician: Glenna Fellows T  Admission Diagnoses: left groin mass  Discharge Diagnoses: [facility]  Operations: Procedure(s): HERNIA REPAIR INGUINAL ADULT  Admission Condition: good  Discharged Condition: good  Indication for Admission: L groin mass possible hernia vs lymph node  Hospital Course: Uneventful LIH repair.  No complications and discharged POD 1  Disposition: Home  Patient Instructions:   Donna Bailey, Donna Bailey  Home Medication Instructions [id number]   Printed on:10/09/11 0848  Medication Information                    diphenhydrAMINE (BENADRYL) 25 mg capsule Take 25 mg by mouth every 6 (six) hours as needed. Allergies            zolpidem (AMBIEN) 10 MG tablet Take 10 mg by mouth at bedtime.            Ascorbic Acid (VITAMIN C) 1000 MG tablet Take 1,000 mg by mouth 3 (three) times daily before meals.             ondansetron (ZOFRAN) 4 MG tablet Take 4 mg by mouth every 8 (eight) hours as needed. Nausea            Omega-3 Fatty Acids (FISH OIL PO) Take 2 capsules by mouth daily.            Homeopathic Products (ZICAM ALLERGY RELIEF NA) Place 1 spray into the nose daily as needed. Allergies            NON FORMULARY Take 20 drops by mouth 3 (three) times daily.            esomeprazole (NEXIUM) 40 MG capsule Take 1 capsule (40 mg total) by mouth 3 (three) times daily before meals.           polyethylene glycol (MIRALAX / GLYCOLAX) packet Take 17 g by mouth daily.           naftifine (NAFTIN) 1 % cream Apply topically daily.             Activity: no heavy lifting for 4 weeks Diet: regular diet Wound Care: none needed  Follow-up:  With Dr Johna Sheriff in 3 weeks.  Signed: Mariella Saa MD, FACS  10/09/2011, 8:48 AM

## 2011-10-09 NOTE — Discharge Instructions (Signed)
[facility] _______[facility]  UMBILICAL OR INGUINAL HERNIA REPAIR: POST OP INSTRUCTIONS  Always review your discharge instruction sheet given to you by the facility where your surgery was performed. IF YOU HAVE DISABILITY OR FAMILY LEAVE FORMS, YOU MUST BRING THEM TO THE OFFICE FOR PROCESSING.   DO NOT GIVE THEM TO YOUR DOCTOR.  1. A  prescription for pain medication may be given to you upon discharge.  Take your pain medication as prescribed, if needed.  If narcotic pain medicine is not needed, then you may take acetaminophen (Tylenol) or ibuprofen (Advil) as needed. 2. Take your usually prescribed medications unless otherwise directed. 3. If you need a refill on your pain medication, please contact your pharmacy.  They will contact our office to request authorization. Prescriptions will not be filled after 5 pm or on week-ends. 4. You should follow a light diet the first 24 hours after arrival home, such as soup and crackers, etc.  Be sure to include lots of fluids daily.  Resume your normal diet the day after surgery. 5. Most patients will experience some swelling and bruising around the umbilicus or in the groin and scrotum.  Ice packs and reclining will help.  Swelling and bruising can take several days to resolve.  6. It is common to experience some constipation if taking pain medication after surgery.  Increasing fluid intake and taking a stool softener (such as Colace) will usually help or prevent this problem from occurring.  A mild laxative (Milk of Magnesia or Miralax) should be taken according to package directions if there are no bowel movements after 48 hours. 7. Unless discharge instructions indicate otherwise, you may remove your bandages 24-48 hours after surgery, and you may shower at that time.  You may have steri-strips (small skin tapes) in place directly over the incision.  These strips should be left on the skin for 7-10 days.  If your surgeon used skin glue on the  incision, you may shower in 24 hours.  The glue will flake off over the next 2-3 weeks.  Any sutures or staples will be removed at the office during your follow-up visit. 8. ACTIVITIES:  You may resume regular (light) daily activities beginning the next day--such as daily self-care, walking, climbing stairs--gradually increasing activities as tolerated.  You may have sexual intercourse when it is comfortable.  Refrain from any heavy lifting or straining until approved by your doctor. a. You may drive when you are no longer taking prescription pain medication, you can comfortably wear a seatbelt, and you can safely maneuver your car and apply brakes. b. RETURN TO WORK:  __________________________________________________________ 9. You should see your doctor in the office for a follow-up appointment approximately 2-3 weeks after your surgery.  Make sure that you call for this appointment within a day or two after you arrive home to insure a convenient appointment time. 10. OTHER INSTRUCTIONS:  __________________________________________________________________________________________________________________________________________________________________________________________  WHEN TO CALL YOUR DOCTOR: 1. Fever over 101.0 2. Inability to urinate 3. Nausea and/or vomiting 4. Extreme swelling or bruising 5. Continued bleeding from incision. 6. Increased pain, redness, or drainage from the incision  The clinic staff is available to answer your questions during regular business hours.  Please don't hesitate to call and ask to speak to one of the nurses for clinical concerns.  If you have a medical emergency, go to the nearest emergency room or call 911.  A surgeon from [facility] is always on call at the hospital     [address], [address], [place], [place]  [address] ?  [address] [address], [place], [place]   [address] [phone] ? [phone] ? FAX [phone] Web site:  [email or web address]  

## 2011-10-09 NOTE — Progress Notes (Signed)
Patient ID: [name], female   DOB: August 20, 1945, 66 y.o.   MRN: [id number] 1 Day Post-Op  Subjective: Some pain but tolerable.  No other C/O  Objective: Vital signs in last 24 hours: Temp:  [97.2 F (36.2 C)-98.8 F (37.1 C)] 98.8 F (37.1 C) (03/07 0604) Pulse Rate:  [51-71] 70  (03/07 0604) Resp:  [6-18] 18  (03/07 0604) BP: (87-139)/(51-92) 112/72 mmHg (03/07 0604) SpO2:  [96 %-100 %] 100 % (03/07 0604) Weight:  [177 lb (80.287 kg)] 177 lb (80.287 kg) (03/06 1351) Last BM Date: 10/08/11  Intake/Output from previous day: 03/06 0701 - 03/07 0700 In: 2720 [P.O.:920; I.V.:1800] Out: 3275 [Urine:3250; Blood:25] Intake/Output this shift:    General appearance: alert and no distress Incision/Wound: Clean and dry withouo unusual swelling  Lab Results:  No results found for this basename: WBC:2,HGB:2,HCT:2,PLT:2 in the last 72 hours BMET No results found for this basename: NA:2,K:2,CL:2,CO2:2,GLUCOSE:2,BUN:2,CREATININE:2,CALCIUM:2 in the last 72 hours   Studies/Results: No results found.  Anti-infectives: Anti-infectives     Start     Dose/Rate Route Frequency Ordered Stop   10/08/11 0645   vancomycin (VANCOCIN) IVPB 1000 mg/200 mL premix        1,000 mg 200 mL/hr over 60 Minutes Intravenous 120 min pre-op 10/08/11 4098 10/08/11 0814          Assessment/Plan: s/p Procedure(s): HERNIA REPAIR INGUINAL ADULT Doing well without complications OK for discharge   LOS: 1 day    Donna Bailey T 10/09/2011  ggh

## 2011-10-13 ENCOUNTER — Telehealth (INDEPENDENT_AMBULATORY_CARE_PROVIDER_SITE_OTHER): Payer: Self-pay | Admitting: General Surgery

## 2011-10-23 ENCOUNTER — Encounter (INDEPENDENT_AMBULATORY_CARE_PROVIDER_SITE_OTHER): Payer: Self-pay | Admitting: General Surgery

## 2011-10-23 ENCOUNTER — Ambulatory Visit (INDEPENDENT_AMBULATORY_CARE_PROVIDER_SITE_OTHER): Payer: Medicare Other | Admitting: General Surgery

## 2011-10-23 VITALS — BP 126/88 | HR 60 | Temp 97.6°F | Resp 16 | Ht 66.0 in | Wt 180.4 lb

## 2011-10-23 DIAGNOSIS — Z09 Encounter for follow-up examination after completed treatment for conditions other than malignant neoplasm: Secondary | ICD-10-CM

## 2011-10-23 NOTE — Patient Instructions (Signed)
No heavy lifting or strenuous activity for the next 2 weeks and then can gradually returned to any activity per

## 2011-10-23 NOTE — Progress Notes (Signed)
History: Patient returns 2 weeks following repair of her left inguinal hernia. She's getting along very well. She is very pleased with her results she feels. She denies any significant pain or other complaints.  Exam: She appears well. The incision is healing nicely. No evidence of infection, seroma, or other problems.  Assessment and plan: Doing very well following left inguinal hernia repair. We discussed activity limitations over the next couple of weeks. She will return as needed.

## 2011-10-29 ENCOUNTER — Encounter (HOSPITAL_COMMUNITY): Payer: Self-pay | Admitting: General Surgery

## 2011-12-02 ENCOUNTER — Encounter: Payer: Self-pay | Admitting: Internal Medicine

## 2011-12-25 ENCOUNTER — Encounter: Payer: Self-pay | Admitting: Internal Medicine

## 2011-12-25 ENCOUNTER — Other Ambulatory Visit: Payer: Medicare Other

## 2011-12-25 ENCOUNTER — Ambulatory Visit (INDEPENDENT_AMBULATORY_CARE_PROVIDER_SITE_OTHER): Payer: Medicare Other | Admitting: Internal Medicine

## 2011-12-25 VITALS — BP 118/64 | HR 62 | Ht 67.5 in | Wt 187.4 lb

## 2011-12-25 DIAGNOSIS — J209 Acute bronchitis, unspecified: Secondary | ICD-10-CM

## 2011-12-25 DIAGNOSIS — K9049 Malabsorption due to intolerance, not elsewhere classified: Secondary | ICD-10-CM

## 2011-12-25 DIAGNOSIS — K9089 Other intestinal malabsorption: Secondary | ICD-10-CM

## 2011-12-25 DIAGNOSIS — R0602 Shortness of breath: Secondary | ICD-10-CM

## 2011-12-25 MED ORDER — BECLOMETHASONE DIPROPIONATE 40 MCG/ACT IN AERS: 2.0000 | INHALATION_SPRAY | Freq: Two times a day (BID) | RESPIRATORY_TRACT | Status: DC

## 2011-12-25 MED ORDER — MONTELUKAST SODIUM 10 MG PO TABS: 10.0000 mg | ORAL_TABLET | Freq: Every day | ORAL | Status: DC

## 2011-12-25 NOTE — Patient Instructions (Addendum)
Order- lab Food IgE profile  Dx food allergy  Script Qvar inhaler--   Try 2 puffs when needed for help if you are exposed to one of your triggers  Script for oxygen- take it to [facility] and see if they could let you self pay for a small oxygen tank to carry when needed.  Script Singulair airway anti-inflammatory to use daily for prevention, to stabilize your airways.  It is easy to let yourself get scared about an exposure or situation, to where you worry more than is really called for. Watch out for that. Sometimes just walking away to calm down may be the best approach.
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
## 2011-12-29 NOTE — Assessment & Plan Note (Signed)
She wants to self-pay for small portable oxygen tank keep available. I discussed oxygen therapy and prescribing with her. She does not qualify for standard home oxygen and I think this will be mostly serving to relieve anxiety

## 2011-12-29 NOTE — Assessment & Plan Note (Addendum)
I reviewed food allergy and avoidance of trigger foods again. She can try stabilizing her airway with Singulair and Qvar. Plan-food allergy IgE profile

## 2011-12-30 LAB — ALLERGEN FOOD PROFILE SPECIFIC IGE
Apple: <0.1 kU/L (ref <0.35)
Corn: <0.1 kU/L (ref <0.35)
Egg White IgE: <0.1 kU/L (ref <0.35)
Orange: <0.1 kU/L (ref <0.35)
Shrimp IgE: <0.1 kU/L (ref <0.35)
Tomato IgE: <0.1 kU/L (ref <0.35)

## 2012-01-06 NOTE — Progress Notes (Signed)
Quick Note:  Pt is aware of results. ______ 

## 2012-01-09 ENCOUNTER — Ambulatory Visit (AMBULATORY_SURGERY_CENTER): Payer: Medicare Other | Admitting: *Deleted

## 2012-01-09 VITALS — Ht 66.0 in | Wt 187.2 lb

## 2012-01-09 DIAGNOSIS — K2270 Barrett's esophagus without dysplasia: Secondary | ICD-10-CM

## 2012-01-23 ENCOUNTER — Encounter: Payer: Self-pay | Admitting: Internal Medicine

## 2012-01-23 ENCOUNTER — Ambulatory Visit (AMBULATORY_SURGERY_CENTER): Payer: Medicare Other | Admitting: Internal Medicine

## 2012-01-23 VITALS — BP 125/79 | HR 70 | Temp 96.4°F | Resp 18 | Ht 66.0 in | Wt 187.0 lb

## 2012-01-23 DIAGNOSIS — K2270 Barrett's esophagus without dysplasia: Secondary | ICD-10-CM

## 2012-01-23 DIAGNOSIS — D131 Benign neoplasm of stomach: Secondary | ICD-10-CM

## 2012-01-23 DIAGNOSIS — K219 Gastro-esophageal reflux disease without esophagitis: Secondary | ICD-10-CM

## 2012-01-23 MED ORDER — MIDAZOLAM HCL 1 MG/ML IJ SOLN: 2.0000 mg | Freq: Once | INTRAMUSCULAR | Status: DC

## 2012-01-23 MED ORDER — SODIUM CHLORIDE 0.9 % IV SOLN: 500.0000 mL | INTRAVENOUS | Status: DC

## 2012-01-23 NOTE — Patient Instructions (Addendum)

## 2012-01-23 NOTE — Op Note (Addendum)
[facility] [address] [place], [place]  [address]  ENDOSCOPY PROCEDURE REPORT  PATIENT:  Donna Bailey, Donna Bailey  MR#:  [id number] BIRTHDATE:  01-Aug-1946, 66 yrs. old  GENDER:  female  ENDOSCOPIST:  Wilhemina Bonito. Eda Keys, MD Referred by:  Surveillance Program Recall,  PROCEDURE DATE:  01/23/2012 PROCEDURE:  EGD with biopsy, 43239 ASA CLASS:  Class II INDICATIONS:  h/o Barrett's Esophagus ; last exam 08-2011 w/ erosive esophagitis. PPI dose increased. Now for relook w/ bx  MEDICATIONS:   MAC sedation, administered by CRNA, propofol (Diprivan) 180 mg IV TOPICAL ANESTHETIC:  none  DESCRIPTION OF PROCEDURE:   After the risks benefits and alternatives of the procedure were thoroughly explained, informed consent was obtained.  The LB GIF-H180 [id number] endoscope was introduced through the mouth and advanced to the second portion of the duodenum, without limitations.  The instrument was slowly withdrawn as the mucosa was fully examined. <<PROCEDUREIMAGES>>  Erosive esophagitis heaqled. Barrett's esophagus was found in the distal  1.5cm esophagus.  Bx x 6 taken. There were multiple fundic gland polyps identified in the body/ fundus of the stomach. Otherwise the examination was normal to D2.    Retroflexed views revealed a hiatal hernia.    The scope was then withdrawn from the patient and the procedure completed.  COMPLICATIONS:  None  ENDOSCOPIC IMPRESSION: 1) Barrett's esophagus in the distal esophagus 2) Polyps, multiple in the body of the stomach 3) Otherwise normal examination 4) A hiatal hernia  RECOMMENDATIONS: 1) REPEAT SURVEILLANCE EGD IN 3 YEARS IF NO DYSPLASIA ON BX 2) Continue PPI AT CURRENT DOSE  ______________________________ Wilhemina Bonito. Eda Keys, MD  CC:  Ancil Boozer MD;   The Patient  n. eSIGNED:   Wilhemina Bonito. Eda Keys at 01/23/2012 09:38 AM  Threasa Heads, [phone]

## 2012-01-23 NOTE — Progress Notes (Signed)
Pt. Has edematous upper lip in center.  She has applied ice on her own throughout recovery period.  Advised to continue icing and That swelling will resolve.Patient did not experience any of the following events: a burn prior to discharge; a fall within the facility; wrong site/side/patient/procedure/implant event; or a hospital transfer or hospital admission upon discharge from the facility. [phone]) Patient did not have preoperative order for IV antibiotic SSI prophylaxis. [phone]) Patient did not have preoperative order for IV antibiotic SSI prophylaxis. [phone])

## 2012-01-23 NOTE — Progress Notes (Signed)
0850  Pt crying and stating right upper arm is hurting.  IV is running wide open with no swelling or redness. Heat applied to arm. Pt has two spider bites to right arm above wrist area.  Both are dry patches and states the Dr gave her a cortizone shot for these.  Donna Kirschner RN in to talk with Donna Bailey.  Pt very nervous and anxious.  Dr Marina Goodell notified and gave instructions to give 2 mg of versed in admitting.  Donna Kirschner RN administered 2 mg versed via IV at [phone].

## 2012-01-23 NOTE — Progress Notes (Signed)
0745 Pt very hostile.  Does not want to answer my questions.  When trying to ask questions about medications pt did not want to give me answers to questions and was very vague.  Explained to her the importance of needing last doses on medications.  Pt then answered questions very vaguely.  Difficult to work with and very argumentative.

## 2012-01-26 ENCOUNTER — Telehealth: Payer: Self-pay | Admitting: *Deleted

## 2012-01-26 NOTE — Telephone Encounter (Signed)
  Follow up Call-  Call back number 01/23/2012 08/14/2011  Post procedure Call Back phone  # [phone] PERFER FOR CALLER TO TALK TO HER.  Permission to leave phone message Yes -     Patient questions:  Do you have a fever, pain , or abdominal swelling? no Pain Score  0 *  Have you tolerated food without any problems? yes  Have you been able to return to your normal activities? yes  Do you have any questions about your discharge instructions: Diet   no Medications  no Follow up visit  no  Do you have questions or concerns about your Care? no  Actions: * If pain score is 4 or above: No action needed, pain <4.  States she had indigestion yesterday, but is feeling better today.

## 2012-01-27 ENCOUNTER — Encounter: Payer: Self-pay | Admitting: Internal Medicine

## 2012-03-30 ENCOUNTER — Encounter: Payer: Self-pay | Admitting: Internal Medicine

## 2012-03-30 ENCOUNTER — Ambulatory Visit (INDEPENDENT_AMBULATORY_CARE_PROVIDER_SITE_OTHER): Payer: Medicare Other | Admitting: Internal Medicine

## 2012-03-30 VITALS — BP 128/70 | HR 69 | Ht 67.5 in | Wt 188.2 lb

## 2012-03-30 DIAGNOSIS — J309 Allergic rhinitis, unspecified: Secondary | ICD-10-CM

## 2012-03-30 DIAGNOSIS — J45909 Unspecified asthma, uncomplicated: Secondary | ICD-10-CM

## 2012-03-30 NOTE — Patient Instructions (Addendum)
Sample Dymista nasal spray to try for allergic nose-    1-2 puffs each nostril every night at bedtime  You can also ask the pharmacy for help finding an otc decongestant with phenylephrine - perhaps a liquid for pediatric use for stuffy nose

## 2012-03-30 NOTE — Progress Notes (Signed)
07/04/11- 66 year old female never smoker followed for bronchitis, allergic rhinitis, complicated by GERD and multiple medical problems. Here with husband.Marland Kitchen Has had flu vaccine. She says her primary practitioner doesn't treat bronchitis and sent her here (!). She began with a sore throat 7 or 8 days ago, progressive with sneeze head and chest congestion. Neighbor was burning something outdoors and does smoke was a further irritant. Now more short of breath than usual, cough productive of yellow sputum. Denies fever. Sore throat is gone. Ears hurt some.  12/25/11-  66 year old female never smoker followed for bronchitis, allergic rhinitis, complicated by GERD and multiple medical problems. Here with husband.. Doing okay with breathing at this time; still has issues with breathing when around certain scents. Z-Pak and Depo-Medrol help last visit. She is very sensitive to odors including exhaust, perfumes. She attributes her problems to corn, to the extent that if she takes any material, in anyway, was made with or contains any sort of corn extract , that she will have symptoms. Fragrances caused facial tingling and itching without visible rash.  she says she is afraid of any generic products because "they are more likely to be made with corn".   03/30/12-  66 year old female never smoker followed for bronchitis, allergic rhinitis, complicated by GERD and multiple medical problems. Here with husband..  No longer using QVAR-doesnt feel like she needs it; states doing pretty well overall; Having more issues with exposure to mold-can tell if she is around it-has head congestion and thick phlegm with it. 13 days ago she cleaned a friend's basement where there was mold and she took Benadryl. Got an industrial mask she carries with her and her car  ROS-see HPI Constitutional:   No-   weight loss, night sweats, fevers, chills, fatigue, lassitude. HEENT:   No-  headaches, difficulty swallowing, tooth/dental  problems, sore throat,       + sneezing, itching, ear ache, nasal congestion, post nasal drip,  CV:  No-   chest pain, orthopnea, PND, swelling in lower extremities, anasarca,  dizziness, palpitations Resp: + shortness of breath with exertion or at rest.              +  productive cough,  No non-productive cough,  No- coughing up of blood.              No-   change in color of mucus.  No- wheezing.   Skin: No-   rash or lesions. GI:  No-   heartburn, indigestion, abdominal pain, nausea, vomiting,  GU:  MS:  No-   joint pain or swelling.   Neuro-     nothing unusual Psych:  No- change in mood or affect. No depression or anxiety.  No memory loss.  OBJ General- Alert, Oriented, Affect-appropriate, Distress- none acute. Comments, talkative. Skin- rash-none, lesions- none, excoriation- none Lymphadenopathy- none Head- atraumatic            Eyes- Gross vision intact, PERRLA, conjunctivae clear secretions. +Mild periorbital edema            Ears- Hearing, canals-normal            Nose- + turbinate edema, no-Septal dev, mucus, polyps, erosion, perforation             Throat- Mallampati II , mucosa clear , drainage- none, tonsils- atrophic Neck- flexible , trachea midline, no stridor , thyroid nl, carotid no bruit Chest - symmetrical excursion , unlabored  Heart/CV- RRR , no murmur , no gallop  , no rub, nl s1 s2                           - JVD- none , edema- none, stasis changes- none, varices- none           Lung- clear, wheeze- none, cough- none , dullness-none, rub- none           Chest wall-  Abd-  Br/ Gen/ Rectal- Not done, not indicated Extrem- cyanosis- none, clubbing, none, atrophy- none, strength- nl Neuro- grossly intact to observation  In

## 2012-04-05 NOTE — Assessment & Plan Note (Signed)
She had exacerbation after working in a moldy basement. Mostly the problem with this rhinitis she had a little chest tightness resolved without additional intervention.

## 2012-04-05 NOTE — Assessment & Plan Note (Signed)
Plan- try phenylephrine as the decongestant. Sample Dymista.

## 2012-05-25 ENCOUNTER — Telehealth: Payer: Self-pay | Admitting: Internal Medicine

## 2012-05-25 MED ORDER — CEFDINIR 300 MG PO CAPS: 300.0000 mg | ORAL_CAPSULE | Freq: Two times a day (BID) | ORAL | Status: DC

## 2012-05-25 NOTE — Telephone Encounter (Signed)
Offer cefdinir 300 mg, # 14   1 twice daily

## 2012-05-25 NOTE — Telephone Encounter (Signed)
Called spoke with patient who reported that she went to [facility] over the weekend for prod cough with green mucus and was given a zpak.  Pt reports that this morning she is producing more green mucus and is experiencing some labored breathing.  Denies f/c/s, wheezing, tightness.  Is requesting either treatment over the phone or appt.  Dr Maple Hudson please advise, thanks. Last ov 8.27.13, follow up in 4 months >> 12.17.13 w/ CY.  [facility] Allergies  Allergen Reactions  . Sulfonamide Derivatives Anaphylaxis  . Penicillins Rash    Underarms (both)  . Ciprofloxacin Other (See Comments)    unknown  . Corn-Containing Products Itching and Other (See Comments)    Can not walk if have a lot of it.  . Formoterol Fumarate Other (See Comments)    unknown  . Levofloxacin     REACTION: HEART RACING  . Wheat Other (See Comments)    Pain in joints  . Mold Extract (Trichophyton Mentagrophyte) Other (See Comments)    Bumps on back, stops up sinuses.  . Tylenol (Acetaminophen) Rash    On face

## 2012-05-25 NOTE — Telephone Encounter (Signed)
Spoke with pt and notified of recs per CD[facility] She verbalized understanding  Rx was sent to pharm  

## 2012-07-20 ENCOUNTER — Ambulatory Visit (INDEPENDENT_AMBULATORY_CARE_PROVIDER_SITE_OTHER): Payer: Medicare Other | Admitting: Internal Medicine

## 2012-07-20 ENCOUNTER — Encounter: Payer: Self-pay | Admitting: Internal Medicine

## 2012-07-20 VITALS — BP 120/78 | HR 59 | Ht 67.5 in | Wt 192.0 lb

## 2012-07-20 DIAGNOSIS — G4700 Insomnia, unspecified: Secondary | ICD-10-CM

## 2012-07-20 DIAGNOSIS — F5104 Psychophysiologic insomnia: Secondary | ICD-10-CM

## 2012-07-20 DIAGNOSIS — J309 Allergic rhinitis, unspecified: Secondary | ICD-10-CM

## 2012-07-20 MED ORDER — METHYLPREDNISOLONE ACETATE 80 MG/ML IJ SUSP
80.0000 mg | Freq: Once | INTRAMUSCULAR | Status: AC
  Administered 2012-07-20: 80 mg via INTRAMUSCULAR

## 2012-07-20 MED ORDER — PHENYLEPHRINE HCL 1 % NA SOLN
3.0000 [drp] | Freq: Once | NASAL | Status: AC
  Administered 2012-07-20: 3 [drp] via NASAL

## 2012-07-20 MED ORDER — ZOLPIDEM TARTRATE 10 MG PO TABS: 10.0000 mg | ORAL_TABLET | Freq: Every day | ORAL | Status: DC

## 2012-07-20 MED ORDER — CEFDINIR 300 MG PO CAPS: 300.0000 mg | ORAL_CAPSULE | Freq: Two times a day (BID) | ORAL | Status: DC

## 2012-07-20 MED ORDER — HYDROCOD POLST-CHLORPHEN POLST 10-8 MG/5ML PO LQCR: 5.0000 mL | Freq: Two times a day (BID) | ORAL | Status: DC | PRN

## 2012-07-20 NOTE — Patient Instructions (Addendum)
Scripts for [employer], Ambien  Possible alternative sleep meds- Sonata, halcion  Neb neo nasal  Depo 80

## 2012-07-20 NOTE — Progress Notes (Signed)
07/04/11- 66 year old female never smoker followed for bronchitis, allergic rhinitis, complicated by GERD and multiple medical problems. Here with husband.Donna Bailey Has had flu vaccine. She says her primary practitioner doesn't treat bronchitis and sent her here (!). She began with a sore throat 7 or 8 days ago, progressive with sneeze head and chest congestion. Neighbor was burning something outdoors and does smoke was a further irritant. Now more short of breath than usual, cough productive of yellow sputum. Denies fever. Sore throat is gone. Ears hurt some.  12/25/11-  66 year old female never smoker followed for bronchitis, allergic rhinitis, complicated by GERD and multiple medical problems. Here with husband.. Doing okay with breathing at this time; still has issues with breathing when around certain scents. Z-Pak and Depo-Medrol help last visit. She is very sensitive to odors including exhaust, perfumes. She attributes her problems to corn, to the extent that if she takes any material, in anyway, was made with or contains any sort of corn extract , that she will have symptoms. Fragrances caused facial tingling and itching without visible rash.  she says she is afraid of any generic products because "they are more likely to be made with corn".   03/30/12-  66 year old female never smoker followed for bronchitis, allergic rhinitis, complicated by GERD and multiple medical problems. Here with husband..  No longer using QVAR-doesnt feel like she needs it; states doing pretty well overall; Having more issues with exposure to mold-can tell if she is around it-has head congestion and thick phlegm with it. 13 days ago she cleaned a friend's basement where there was mold and she took Benadryl. Got an industrial mask she carries with her and her car  07/20/12- 66 year old female never smoker followed for bronchitis, allergic rhinitis, complicated by GERD and multiple medical problems. Here with husband.. FOLLOWS  FOR: stuffiness in nasal area, achy in sinus, bringing up light green colored phelgm and a cough. Would like to have Tussionex RX (large bottle) Nasal congestion and bilateral maxillary soreness for 3 days with yellow-green postnasal drip. Denies fever. Dymista nasal spray was not sufficient. Asks refill Ambien for her chronic insomnia. We discussed sleep habits.  ROS-see HPI Constitutional:   No-   weight loss, night sweats, fevers, chills, fatigue, lassitude. HEENT:   No-  headaches, difficulty swallowing, tooth/dental problems, sore throat,       + sneezing, itching, ear ache, nasal congestion, post nasal drip,  CV:  No-   chest pain, orthopnea, PND, swelling in lower extremities, anasarca,  dizziness, palpitations Resp: + shortness of breath with exertion or at rest.              +  productive cough,  No non-productive cough,  No- coughing up of blood.              No-   change in color of mucus.  No- wheezing.   Skin: No-   rash or lesions. GI:  No-   heartburn, indigestion, abdominal pain, nausea, vomiting,  GU:  MS:  No-   joint pain or swelling.   Neuro-     nothing unusual Psych:  No- change in mood or affect. No depression or anxiety.  No memory loss.  OBJ General- Alert, Oriented, Affect-appropriate, Distress- none acute. Comments, talkative. Skin- rash-none, lesions- none, excoriation- none Lymphadenopathy- none Head- atraumatic            Eyes- Gross vision intact, PERRLA, conjunctivae clear secretions. +Mild periorbital edema  Ears- Hearing, canals-normal            Nose- + turbinate edema with mucus bridging, no-Septal dev, polyps, erosion, perforation             Throat- Mallampati II , mucosa red , drainage- none, tonsils- atrophic Neck- flexible , trachea midline, no stridor , thyroid nl, carotid no bruit Chest - symmetrical excursion , unlabored           Heart/CV- RRR , no murmur , no gallop  , no rub, nl s1 s2                           - JVD- none ,  edema- none, stasis changes- none, varices- none           Lung- clear, wheeze- none, cough- none , dullness-none, rub- none           Chest wall-  Abd-  Br/ Gen/ Rectal- Not done, not indicated Extrem- cyanosis- none, clubbing, none, atrophy- none, strength- nl Neuro- grossly intact to observation

## 2012-08-01 DIAGNOSIS — F5104 Psychophysiologic insomnia: Secondary | ICD-10-CM | POA: Insufficient documentation

## 2012-08-01 NOTE — Assessment & Plan Note (Signed)
Increased rhinitis. Pattern is most consistent with URI and possible early sinus infection. Plan-Omnicef,  nasal decongestant nebulizer, Depo-Medrol

## 2012-08-01 NOTE — Assessment & Plan Note (Addendum)
Discussed sleep hygiene, medication use, and relaxation therapy. Plan-suggested a short acting sleep medicine if she has problems with occasional Ambien

## 2012-10-11 ENCOUNTER — Encounter: Payer: Self-pay | Admitting: Gastroenterology

## 2012-10-11 ENCOUNTER — Telehealth (INDEPENDENT_AMBULATORY_CARE_PROVIDER_SITE_OTHER): Payer: Self-pay | Admitting: General Surgery

## 2012-10-11 ENCOUNTER — Ambulatory Visit (INDEPENDENT_AMBULATORY_CARE_PROVIDER_SITE_OTHER)
Admission: RE | Admit: 2012-10-11 | Discharge: 2012-10-11 | Disposition: A | Discharge status: Home / Self Care | Payer: 59 | Source: Ambulatory Visit | Attending: Gastroenterology | Admitting: Gastroenterology

## 2012-10-11 ENCOUNTER — Telehealth: Payer: Self-pay | Admitting: Internal Medicine

## 2012-10-11 ENCOUNTER — Other Ambulatory Visit (INDEPENDENT_AMBULATORY_CARE_PROVIDER_SITE_OTHER): Payer: 59

## 2012-10-11 ENCOUNTER — Other Ambulatory Visit: Payer: 59

## 2012-10-11 ENCOUNTER — Ambulatory Visit (INDEPENDENT_AMBULATORY_CARE_PROVIDER_SITE_OTHER): Payer: 59 | Admitting: Gastroenterology

## 2012-10-11 VITALS — BP 118/82 | HR 62 | Ht 66.5 in | Wt 188.4 lb

## 2012-10-11 DIAGNOSIS — R1032 Left lower quadrant pain: Secondary | ICD-10-CM | POA: Insufficient documentation

## 2012-10-11 DIAGNOSIS — K5900 Constipation, unspecified: Secondary | ICD-10-CM

## 2012-10-11 LAB — CBC WITH DIFFERENTIAL/PLATELET
Basophils Relative: 0.6 % (ref 0.0–3.0)
Eosinophils Relative: 1.8 % (ref 0.0–5.0)
Hemoglobin: 15 g/dL (ref 12.0–15.0)
Lymphocytes Relative: 36.6 % (ref 12.0–46.0)
MCV: 93.1 fl (ref 78.0–100.0)
Monocytes Absolute: 0.6 10*3/uL (ref 0.1–1.0)
Neutro Abs: 3.7 10*3/uL (ref 1.4–7.7)
Neutrophils Relative %: 52.2 % (ref 43.0–77.0)
RBC: 4.79 Mil/uL (ref 3.87–5.11)
WBC: 7 10*3/uL (ref 4.5–10.5)

## 2012-10-11 LAB — TSH: TSH: 1.54 u[IU]/mL (ref 0.35–5.50)

## 2012-10-11 LAB — COMPREHENSIVE METABOLIC PANEL
Albumin: 3.7 g/dL (ref 3.5–5.2)
BUN: 9 mg/dL (ref 6–23)
Calcium: 8.9 mg/dL (ref 8.4–10.5)
Chloride: 105 mEq/L (ref 96–112)
Glucose, Bld: 88 mg/dL (ref 70–99)
Potassium: 4 mEq/L (ref 3.5–5.1)

## 2012-10-11 MED ORDER — IOHEXOL 300 MG/ML  SOLN
100.0000 mL | Freq: Once | INTRAMUSCULAR | Status: AC | PRN
  Administered 2012-10-11: 100 mL via INTRAVENOUS

## 2012-10-11 NOTE — Telephone Encounter (Signed)
Pt called with severe constipation, resulting in pain on Lt side near site of surgery last year.  She states she takes Miralax daily and yesterday took mag citrate.  She has had only minimal results so far.  Asked for appt with Dr. Johna Sheriff.  Pt advised to walk more, increase the Miralax to BID and start stool softeners.  If, after her bowels have finally been sufficiently evacuated, she still has pain, advised pt to call her PCP or GI MD for appt to get a work-up.  If surgery is indicated, they will contact [facility] for appt.  He understands all.

## 2012-10-11 NOTE — Patient Instructions (Addendum)
Your physician has requested that you go to the basement for the following lab work before leaving today: CBC/diff, CMET, TSH  You have been scheduled for a CT scan of the abdomen and pelvis at [facility] ([address]---this is in the same building as Architectural technologist).   You are scheduled on 10/11/12 at 1:45. You should arrive 15 minutes prior to your appointment time for registration. Please follow the written instructions below on the day of your exam:  WARNING: IF YOU ARE ALLERGIC TO IODINE/X-RAY DYE, PLEASE NOTIFY RADIOLOGY IMMEDIATELY AT [phone]! YOU WILL BE GIVEN A 13 HOUR PREMEDICATION PREP.  1) Do not eat or drink anything after now (4 hours prior to your test) 2) You have been given 2 bottles of oral contrast to drink. The solution may taste               better if refrigerated, but do NOT add ice or any other liquid to this solution. Shake             well before drinking.    Drink 1 bottle of contrast @ 11:45 (2 hours prior to your exam)  Drink 1 bottle of contrast @ 12:45 (1 hour prior to your exam)  You may take any medications as prescribed with a small amount of water except for the following: Metformin, Glucophage, Glucovance, Avandamet, Riomet, Fortamet, Actoplus Met, Janumet, Glumetza or Metaglip. The above medications must be held the day of the exam AND 48 hours after the exam.  The purpose of you drinking the oral contrast is to aid in the visualization of your intestinal tract. The contrast solution may cause some diarrhea. Before your exam is started, you will be given a small amount of fluid to drink. Depending on your individual set of symptoms, you may also receive an intravenous injection of x-ray contrast/dye. Plan on being at [facility] for 30 minutes or long, depending on the type of exam you are having performed.  This test typically takes 30-45 minutes to complete.  If you have any questions regarding your exam or if you need to  reschedule, you may call the CT department at [phone] between the hours of 8:00 am and 5:00 pm, Monday-Friday.  ________________________________________________________________________  We will call you with results and plans.

## 2012-10-11 NOTE — Progress Notes (Signed)
10/11/2012 Donna Bailey [id number] 23-May-1946   History of Present Illness: Donna Bailey is a 67 y.o. female with hyperlipidemia, fibromyalgia, GERD complicated by Barrett's esophagus, and adenomatous colon polyps. She was last seen in the office 05/15/2010. She underwent surveillance colonoscopy the following day. Examination was normal except for 2 diminutive polyps which were removed and found to be both adenomatous and hyperplastic. Followup in 5 years recommended. Her last upper endoscopy was performed in June 2013 with Barretts esophagus and no dysplasia; also fundic gland polyps.  She comes in today complaining of constipation and left sided abdominal pain.  Says that this began 7-10 days ago when she pulled a muscle in her back.  She thinks that when she pulled the muscle she dislodged the mesh from her previous inguinal hernia repair or she caused a "kink" in her bowel because the constipation started at the time same.  She says that normal she she regular BM's, but for the last several days she was only able to pass very small pieces despite taking a few doses of Miralax.  Yesterday she drank a bottle of mag citrate, which allowed her to have several liquid BM's.  Reports some nausea, but no vomiting.  No fevers.  Describes the pain as sharp at times.   Current Medications, Allergies, Past Medical History, Past Surgical History, Family History and Social History were reviewed in [employer] record.   Physical Exam: BP 118/82  Pulse 62  Ht 5' 6.5" (1.689 m)  Wt 188 lb 6.4 oz (85.458 kg)  BMI 29.96 kg/m2  SpO2 96% General: Well developed, white female in no acute distress Head: Normocephalic and atraumatic Eyes:  sclerae anicteric, conjunctiva pink  Ears: Normal auditory acuity Lungs: Clear throughout to auscultation Heart: Regular rate and rhythm Abdomen: Soft, non-distended. No masses, no hepatomegaly. Normal bowel sounds.  Minimal TTP in LLQ  without R/R/G.  Scar in left groin noted from previous inguinal hernia repair. Rectal: Deferred. Musculoskeletal: Symmetrical with no gross deformities  Extremities: No edema  Neurological: Alert oriented x 4, grossly nonfocal Psychological:  Alert and cooperative.  Flat affect.  Assessment and Recommendations: -LLQ abdominal pain:  Likely related to muscle strain and recent back injury (could be spasm related to recent constipation and then yesterday's bowel cleanse as well), but patient is convinced and persistent that there is something wrong with her bowels.  Will check CT scan abdomen and pelvis with contrast.  Check CBC, CMP, and TSH as well. -Constipation:  Now that she has been cleansed with mag citrate, I encouraged her to begin taking Miralax daily for the next several days to keep her going.  *Further plans pending the results of the labs and CT scan.

## 2012-10-11 NOTE — Progress Notes (Signed)
Agree with initial assessment and plans 

## 2012-10-11 NOTE — Telephone Encounter (Signed)
Pt states she has been having constipation for about a week, states she had some nausea yesterday. Pt had some  Miralax was not helping so she took Mag Citrate last night and had a little bowel movement. Pt feels a little pain in the area where she had a hernia repair in the past. Pt requests to be seen. Pt scheduled to see Doug Sou PA today at 10:45am. Pt aware of appt date and time.

## 2012-11-16 ENCOUNTER — Ambulatory Visit: Payer: Medicare Other | Admitting: Internal Medicine

## 2012-12-10 ENCOUNTER — Other Ambulatory Visit: Payer: Self-pay | Admitting: Specialist

## 2012-12-13 ENCOUNTER — Ambulatory Visit
Admission: RE | Admit: 2012-12-13 | Discharge: 2012-12-13 | Disposition: A | Discharge status: Home / Self Care | Payer: 59 | Source: Ambulatory Visit | Attending: Specialist | Admitting: Specialist

## 2012-12-29 ENCOUNTER — Other Ambulatory Visit: Payer: Self-pay | Admitting: Neuroradiology

## 2012-12-29 DIAGNOSIS — I671 Cerebral aneurysm, nonruptured: Secondary | ICD-10-CM

## 2012-12-31 ENCOUNTER — Emergency Department (HOSPITAL_COMMUNITY)
Admission: EM | Admit: 2012-12-31 | Discharge: 2012-12-31 | Disposition: A | Discharge status: Home / Self Care | Payer: Medicare Other | Attending: Emergency Medicine | Admitting: Emergency Medicine

## 2012-12-31 ENCOUNTER — Encounter (HOSPITAL_COMMUNITY): Payer: Self-pay | Admitting: Emergency Medicine

## 2012-12-31 DIAGNOSIS — Z862 Personal history of diseases of the blood and blood-forming organs and certain disorders involving the immune mechanism: Secondary | ICD-10-CM | POA: Insufficient documentation

## 2012-12-31 DIAGNOSIS — Z8709 Personal history of other diseases of the respiratory system: Secondary | ICD-10-CM | POA: Insufficient documentation

## 2012-12-31 DIAGNOSIS — Z880 Allergy status to penicillin: Secondary | ICD-10-CM | POA: Insufficient documentation

## 2012-12-31 DIAGNOSIS — Z872 Personal history of diseases of the skin and subcutaneous tissue: Secondary | ICD-10-CM | POA: Insufficient documentation

## 2012-12-31 DIAGNOSIS — Z79899 Other long term (current) drug therapy: Secondary | ICD-10-CM | POA: Insufficient documentation

## 2012-12-31 DIAGNOSIS — Z8739 Personal history of other diseases of the musculoskeletal system and connective tissue: Secondary | ICD-10-CM | POA: Insufficient documentation

## 2012-12-31 DIAGNOSIS — K5900 Constipation, unspecified: Secondary | ICD-10-CM | POA: Insufficient documentation

## 2012-12-31 DIAGNOSIS — R51 Headache: Secondary | ICD-10-CM | POA: Insufficient documentation

## 2012-12-31 DIAGNOSIS — Z860100 Personal history of colon polyps, unspecified: Secondary | ICD-10-CM | POA: Insufficient documentation

## 2012-12-31 DIAGNOSIS — G4700 Insomnia, unspecified: Secondary | ICD-10-CM | POA: Insufficient documentation

## 2012-12-31 DIAGNOSIS — Z8719 Personal history of other diseases of the digestive system: Secondary | ICD-10-CM | POA: Insufficient documentation

## 2012-12-31 DIAGNOSIS — E7800 Pure hypercholesterolemia, unspecified: Secondary | ICD-10-CM | POA: Insufficient documentation

## 2012-12-31 DIAGNOSIS — K219 Gastro-esophageal reflux disease without esophagitis: Secondary | ICD-10-CM | POA: Insufficient documentation

## 2012-12-31 MED ORDER — KETOROLAC TROMETHAMINE 30 MG/ML IJ SOLN
30.0000 mg | Freq: Once | INTRAMUSCULAR | Status: AC
  Administered 2012-12-31: 30 mg via INTRAVENOUS
  Filled 2012-12-31: qty 1

## 2012-12-31 MED ORDER — METOCLOPRAMIDE HCL 10 MG PO TABS: 10.0000 mg | ORAL_TABLET | Freq: Three times a day (TID) | ORAL | Status: DC | PRN

## 2012-12-31 MED ORDER — METOCLOPRAMIDE HCL 5 MG/ML IJ SOLN
10.0000 mg | Freq: Once | INTRAMUSCULAR | Status: AC
  Administered 2012-12-31: 10 mg via INTRAVENOUS
  Filled 2012-12-31: qty 2

## 2012-12-31 MED ORDER — DIPHENHYDRAMINE HCL 50 MG/ML IJ SOLN
25.0000 mg | Freq: Once | INTRAMUSCULAR | Status: AC
  Administered 2012-12-31: 25 mg via INTRAVENOUS
  Filled 2012-12-31: qty 1

## 2012-12-31 MED ORDER — KETOROLAC TROMETHAMINE 10 MG PO TABS: 10.0000 mg | ORAL_TABLET | Freq: Four times a day (QID) | ORAL | Status: DC | PRN

## 2012-12-31 MED ORDER — SODIUM CHLORIDE 0.9 % IV BOLUS (SEPSIS)
500.0000 mL | Freq: Once | INTRAVENOUS | Status: AC
  Administered 2012-12-31: 500 mL via INTRAVENOUS

## 2012-12-31 NOTE — ED Notes (Signed)
Per pt, states left sided head, neck, and jaw pain-has been worked up by neurologist-having changes in vision with left eye that is new-states MRI shows something and needs a angio film done which is scheduled for Sunday
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
## 2013-01-02 ENCOUNTER — Ambulatory Visit
Admission: RE | Admit: 2013-01-02 | Discharge: 2013-01-02 | Disposition: A | Discharge status: Home / Self Care | Payer: Medicare Other | Source: Ambulatory Visit | Attending: Neuroradiology | Admitting: Neuroradiology

## 2013-01-02 DIAGNOSIS — I671 Cerebral aneurysm, nonruptured: Secondary | ICD-10-CM

## 2013-02-10 ENCOUNTER — Other Ambulatory Visit (HOSPITAL_COMMUNITY): Payer: Self-pay | Admitting: Obstetrics and Gynecology

## 2013-02-10 ENCOUNTER — Telehealth (HOSPITAL_COMMUNITY): Payer: Self-pay | Admitting: Obstetrics and Gynecology

## 2013-02-10 DIAGNOSIS — I729 Aneurysm of unspecified site: Secondary | ICD-10-CM

## 2013-02-10 NOTE — Telephone Encounter (Signed)
Called pt to schedule consult with Dr. Corliss Skains. Patient states she will call me back to make appointment. JMichuax

## 2013-02-23 DIAGNOSIS — K2210 Ulcer of esophagus without bleeding: Secondary | ICD-10-CM | POA: Insufficient documentation

## 2013-03-08 ENCOUNTER — Ambulatory Visit (HOSPITAL_COMMUNITY)
Admission: RE | Admit: 2013-03-08 | Discharge: 2013-03-08 | Disposition: A | Discharge status: Home / Self Care | Payer: Medicare Other | Source: Ambulatory Visit | Attending: Obstetrics and Gynecology | Admitting: Obstetrics and Gynecology

## 2013-03-08 DIAGNOSIS — I729 Aneurysm of unspecified site: Secondary | ICD-10-CM

## 2013-03-15 ENCOUNTER — Other Ambulatory Visit (HOSPITAL_COMMUNITY): Payer: Self-pay | Admitting: Interventional Radiology

## 2013-03-15 DIAGNOSIS — I729 Aneurysm of unspecified site: Secondary | ICD-10-CM

## 2013-03-23 ENCOUNTER — Other Ambulatory Visit: Payer: Self-pay | Admitting: Radiology

## 2013-03-28 ENCOUNTER — Encounter (HOSPITAL_COMMUNITY): Payer: Self-pay | Admitting: Pharmacy Technician

## 2013-03-29 ENCOUNTER — Encounter: Payer: Self-pay | Admitting: Internal Medicine

## 2013-03-29 ENCOUNTER — Telehealth: Payer: Self-pay | Admitting: Internal Medicine

## 2013-03-29 ENCOUNTER — Other Ambulatory Visit: Payer: Self-pay | Admitting: Radiology

## 2013-03-29 ENCOUNTER — Ambulatory Visit (INDEPENDENT_AMBULATORY_CARE_PROVIDER_SITE_OTHER): Payer: Medicare Other | Admitting: Internal Medicine

## 2013-03-29 VITALS — BP 96/60 | HR 72 | Ht 66.0 in | Wt 196.0 lb

## 2013-03-29 DIAGNOSIS — L255 Unspecified contact dermatitis due to plants, except food: Secondary | ICD-10-CM

## 2013-03-29 DIAGNOSIS — L237 Allergic contact dermatitis due to plants, except food: Secondary | ICD-10-CM

## 2013-03-29 MED ORDER — METHYLPREDNISOLONE ACETATE 40 MG/ML IJ SUSP
40.0000 mg | Freq: Once | INTRAMUSCULAR | Status: AC
  Administered 2013-03-31: 40 mg via INTRAMUSCULAR

## 2013-03-29 NOTE — Patient Instructions (Addendum)
Order- Depo 40  Dx poison ivy dermatitis  Ok also to continue using your otc medication as needed

## 2013-03-29 NOTE — Telephone Encounter (Signed)
Pt called back again. "please call asap". Donna Bailey

## 2013-03-29 NOTE — Telephone Encounter (Signed)
Called spoke with patient who stated that she was working in her yard yesterday and believes that she was exposed to something.  This morning she woke up itching on her arms with some redness and swelling.  Pt took a Bendaryl shortly after waking and her symptoms have improved.  Pt is requesting an ov today.  Per Florentina Addison, okay to add on at 11am today.  Pt is okay with this and verbalized her understanding.  Appt scheduled; nothing further needed.  Will sign off.

## 2013-03-29 NOTE — Pre-Procedure Instructions (Signed)
Donna Bailey  03/29/2013   Your procedure is scheduled on:  Thursday April 07, 2013.  Report to [facility] at 6:00 AM.  Call this number if you have problems the morning of surgery: [phone]   Remember:   Do not eat food or drink liquids after midnight.   Take these medicines the morning of surgery with A SIP OF WATER: Esomeprazole (Nexium), Ondansetron (Zofran) if needed for nausea, Tramadol if needed for pain   Do not wear jewelry, make-up or nail polish.  Do not wear lotions, powders, or perfumes. You may wear deodorant.  Do not shave 48 hours prior to surgery.   Do not bring valuables to the hospital.  [facility] is not responsible for any belongings or valuables.  Contacts, dentures or bridgework may not be worn into surgery.  Leave suitcase in the car. After surgery it may be brought to your room.  For patients admitted to the hospital, checkout time is 11:00 AM the day of discharge.   Patients discharged the day of surgery will not be allowed to drive home.  Name and phone number of your driver: Family/Friend  Special Instructions: Shower using CHG 2 nights before surgery and the night before surgery.  If you shower the day of surgery use CHG.  Use special wash - you have one bottle of CHG for all showers.  You should use approximately 1/3 of the bottle for each shower.   Please read over the following fact sheets that you were given: Pain Booklet, Coughing and Deep Breathing and Surgical Site Infection Prevention
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
## 2013-03-30 ENCOUNTER — Encounter (HOSPITAL_COMMUNITY)
Admission: RE | Admit: 2013-03-30 | Discharge: 2013-03-30 | Disposition: A | Discharge status: Home / Self Care | Payer: Medicare Other | Source: Ambulatory Visit | Attending: Interventional Radiology | Admitting: Interventional Radiology

## 2013-03-30 ENCOUNTER — Encounter (HOSPITAL_COMMUNITY): Payer: Self-pay

## 2013-03-30 DIAGNOSIS — Z01818 Encounter for other preprocedural examination: Secondary | ICD-10-CM | POA: Insufficient documentation

## 2013-03-30 DIAGNOSIS — Z01810 Encounter for preprocedural cardiovascular examination: Secondary | ICD-10-CM | POA: Insufficient documentation

## 2013-03-30 DIAGNOSIS — Z01812 Encounter for preprocedural laboratory examination: Secondary | ICD-10-CM | POA: Insufficient documentation

## 2013-03-30 HISTORY — DX: Personal history of other diseases of the digestive system: Z87.19

## 2013-03-30 HISTORY — DX: Constipation, unspecified: K59.00

## 2013-03-30 HISTORY — DX: Calculus of kidney: N20.0

## 2013-03-30 HISTORY — DX: Migraine, unspecified, not intractable, without status migrainosus: G43.909

## 2013-03-30 HISTORY — DX: Pneumonia, unspecified organism: J18.9

## 2013-03-30 HISTORY — DX: Anxiety disorder, unspecified: F41.9

## 2013-03-30 HISTORY — DX: Allergy to other foods: Z91.018

## 2013-03-30 LAB — CBC WITH DIFFERENTIAL/PLATELET
Basophils Absolute: 0 10*3/uL (ref 0.0–0.1)
HCT: 44.7 % (ref 36.0–46.0)
Lymphocytes Relative: 37 % (ref 12–46)
Neutro Abs: 3 10*3/uL (ref 1.7–7.7)
Platelets: 261 10*3/uL (ref 150–400)
RBC: 4.9 MIL/uL (ref 3.87–5.11)
RDW: 13.3 % (ref 11.5–15.5)
WBC: 5.8 10*3/uL (ref 4.0–10.5)

## 2013-03-30 LAB — COMPREHENSIVE METABOLIC PANEL
ALT: 15 U/L (ref 0–35)
AST: 23 U/L (ref 0–37)
Alkaline Phosphatase: 71 U/L (ref 39–117)
CO2: 23 mEq/L (ref 19–32)
Chloride: 102 mEq/L (ref 96–112)
GFR calc non Af Amer: 74 mL/min — ABNORMAL LOW (ref >90)
Sodium: 137 mEq/L (ref 135–145)
Total Bilirubin: 0.3 mg/dL (ref 0.3–1.2)

## 2013-03-30 LAB — APTT: aPTT: 34 seconds (ref 24–37)

## 2013-03-30 NOTE — Progress Notes (Signed)
Patient informed Nurse that she has had several stress test and the last one was performed at [facility]. Results requested. Patient also informed Nurse that they found out it was not her heart that was causing her issues, in fact it was "wheat" that she was obtaining from her diet. Since patient has cut wheat out of her diet, she no longer has had any cardiac issues. Patient have Nurse a list of foods containing wheat and it was placed on her chart. When questioned about Plavix intake patient stated "No Dr. Corliss Skains is not going to put me on Plavix because it has wheat in it, instead they are going to give me Heparin."

## 2013-03-31 DIAGNOSIS — L255 Unspecified contact dermatitis due to plants, except food: Secondary | ICD-10-CM

## 2013-03-31 NOTE — Progress Notes (Signed)
Anesthesia Chart Review:  Patient is a 67 year old female scheduled for cerebral arteriogram with possible endovascular intervention under anesthesia on 04/07/13.  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  Dr. Corliss Skains would like to further evaluate and treat if necessary.  Of note, patient told her PAT RN that Dr. Drusilla Kanner is not giving her Plavix due to her wheat allergy but plans to instead treat her with Heparin.  History includes GERD/Barrett's esophagus, fibromyalgia, DDD, overactive bladder, nephrolithiasis, anemia, migraines, hiatal hernia, hypercholesterolemia, allergic rhinitis (Dr. Jetty Duhamel), multiple food allergies/intolerances including gluten, dairy, corn, artificial sweeteners, anxiety, scoliosis, non-smoker, nasal septal surgery, TMJ, left IHR '13.  She has been evaluated by cardiologist Dr. Donato Schultz in the past, last visit in February 2012.  A nuclear stress test was ordered to evaluate for chest pain and results showed no ischemia, normal EF 69%, low risk.  She also had a normal stress echo at [facility] in April 2010.  EKG on 03/30/13 showed NSR. Preoperative labs noted.  Anticipate that she can proceed as planned from an anesthesia standpoint.  Velna Ochs [facility]/Anesthesiology Phone [phone] 03/31/2013 1:23 PM

## 2013-04-07 ENCOUNTER — Encounter (HOSPITAL_COMMUNITY): Payer: Self-pay | Admitting: Certified Registered"

## 2013-04-07 ENCOUNTER — Encounter (HOSPITAL_COMMUNITY)
Admission: RE | Disposition: A | Discharge status: Home / Self Care | Payer: Self-pay | Source: Ambulatory Visit | Attending: Interventional Radiology

## 2013-04-07 ENCOUNTER — Encounter (HOSPITAL_COMMUNITY): Payer: Self-pay

## 2013-04-07 ENCOUNTER — Ambulatory Visit (HOSPITAL_COMMUNITY)
Admission: RE | Admit: 2013-04-07 | Discharge: 2013-04-07 | Disposition: A | Discharge status: Home / Self Care | Payer: Medicare Other | Source: Ambulatory Visit | Attending: Interventional Radiology | Admitting: Interventional Radiology

## 2013-04-07 ENCOUNTER — Encounter (HOSPITAL_COMMUNITY): Payer: Self-pay | Admitting: Vascular Surgery

## 2013-04-07 ENCOUNTER — Other Ambulatory Visit: Payer: Self-pay | Admitting: Radiology

## 2013-04-07 ENCOUNTER — Ambulatory Visit (HOSPITAL_COMMUNITY): Payer: Medicare Other | Admitting: Anesthesiology

## 2013-04-07 VITALS — BP 139/67 | HR 75 | Temp 97.5°F | Resp 18

## 2013-04-07 DIAGNOSIS — IMO0001 Reserved for inherently not codable concepts without codable children: Secondary | ICD-10-CM | POA: Insufficient documentation

## 2013-04-07 DIAGNOSIS — Z79899 Other long term (current) drug therapy: Secondary | ICD-10-CM | POA: Insufficient documentation

## 2013-04-07 DIAGNOSIS — G709 Myoneural disorder, unspecified: Secondary | ICD-10-CM | POA: Insufficient documentation

## 2013-04-07 DIAGNOSIS — I729 Aneurysm of unspecified site: Secondary | ICD-10-CM

## 2013-04-07 DIAGNOSIS — E669 Obesity, unspecified: Secondary | ICD-10-CM | POA: Insufficient documentation

## 2013-04-07 DIAGNOSIS — I671 Cerebral aneurysm, nonruptured: Secondary | ICD-10-CM | POA: Insufficient documentation

## 2013-04-07 DIAGNOSIS — F411 Generalized anxiety disorder: Secondary | ICD-10-CM | POA: Insufficient documentation

## 2013-04-07 DIAGNOSIS — K219 Gastro-esophageal reflux disease without esophagitis: Secondary | ICD-10-CM | POA: Insufficient documentation

## 2013-04-07 DIAGNOSIS — J45909 Unspecified asthma, uncomplicated: Secondary | ICD-10-CM | POA: Insufficient documentation

## 2013-04-07 DIAGNOSIS — K449 Diaphragmatic hernia without obstruction or gangrene: Secondary | ICD-10-CM | POA: Insufficient documentation

## 2013-04-07 HISTORY — PX: RADIOLOGY WITH ANESTHESIA: SHX6223

## 2013-04-07 SURGERY — RADIOLOGY WITH ANESTHESIA
Anesthesia: General

## 2013-04-07 MED ORDER — SODIUM CHLORIDE 0.9 % IV SOLN: INTRAVENOUS | Status: AC

## 2013-04-07 MED ORDER — LACTATED RINGERS IV SOLN
INTRAVENOUS | Status: DC | PRN
  Administered 2013-04-07: 07:00:00 via INTRAVENOUS

## 2013-04-07 MED ORDER — LACTATED RINGERS IV SOLN
INTRAVENOUS | Status: DC | PRN
  Administered 2013-04-07: 08:00:00 via INTRAVENOUS

## 2013-04-07 MED ORDER — IOHEXOL 300 MG/ML  SOLN
150.0000 mL | Freq: Once | INTRAMUSCULAR | Status: AC | PRN
  Administered 2013-04-07: 84 mL via INTRA_ARTERIAL

## 2013-04-07 MED ORDER — FENTANYL CITRATE 0.05 MG/ML IJ SOLN
INTRAMUSCULAR | Status: DC | PRN
  Administered 2013-04-07: 50 ug via INTRAVENOUS

## 2013-04-07 MED ORDER — HEPARIN SODIUM (PORCINE) 1000 UNIT/ML IJ SOLN
INTRAMUSCULAR | Status: DC | PRN
  Administered 2013-04-07: 1000 [IU] via INTRAVENOUS
  Administered 2013-04-07: 500 [IU] via INTRAVENOUS

## 2013-04-07 MED ORDER — FENTANYL CITRATE 0.05 MG/ML IJ SOLN
INTRAMUSCULAR | Status: AC
  Filled 2013-04-07: qty 2

## 2013-04-07 MED ORDER — PROPOFOL 10 MG/ML IV BOLUS
INTRAVENOUS | Status: DC | PRN
  Administered 2013-04-07: 30 mg via INTRAVENOUS

## 2013-04-07 MED ORDER — VANCOMYCIN HCL 1000 MG IV SOLR: 1000.0000 mg | Freq: Once | INTRAVENOUS | Status: DC

## 2013-04-07 MED ORDER — MIDAZOLAM HCL 2 MG/2ML IJ SOLN
INTRAMUSCULAR | Status: AC
  Filled 2013-04-07: qty 2

## 2013-04-07 MED ORDER — VANCOMYCIN HCL IN DEXTROSE 1-5 GM/200ML-% IV SOLN
1000.0000 mg | Freq: Once | INTRAVENOUS | Status: AC
  Administered 2013-04-07: 1000 mg via INTRAVENOUS
  Filled 2013-04-07 (×2): qty 200

## 2013-04-07 MED ORDER — MIDAZOLAM HCL 5 MG/5ML IJ SOLN
INTRAMUSCULAR | Status: DC | PRN
  Administered 2013-04-07: 1 mg via INTRAVENOUS

## 2013-04-07 MED ORDER — SODIUM CHLORIDE 0.9 % IV SOLN: Freq: Once | INTRAVENOUS | Status: DC

## 2013-04-07 NOTE — ED Notes (Signed)
Skin reddened at bilat groin sites after removal of tape are cleansed with H20
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
## 2013-04-07 NOTE — Transfer of Care (Signed)
Immediate Anesthesia Transfer of Care Note  [name]  Procedure(s) Performed: Procedure(s): ANEURYSM EMBOLIZATION  (N/A)  Patient Location: Radiology  Anesthesia Type:MAC  Level of Consciousness: awake, alert  and oriented  Airway & Oxygen Therapy: Patient Spontanous Breathing  Post-op Assessment: Report given to PACU RN, Post -op Vital signs reviewed and stable and Patient moving all extremities  Post vital signs: Reviewed and stable  Complications: No apparent anesthesia complications

## 2013-04-07 NOTE — Anesthesia Postprocedure Evaluation (Signed)
Anesthesia Post Note  [name]  Procedure(s) Performed: Procedure(s) (LRB): ANEURYSM EMBOLIZATION  (N/A)  Anesthesia type: MAC  Patient location: PACU  Post pain: Pain level controlled  Post assessment: Patient's Cardiovascular Status Stable  Last Vitals:  Filed Vitals:   04/07/13 0634  BP: 143/67  Pulse: 58  Temp: 36.2 C  Resp: 20    Post vital signs: Reviewed and stable  Level of consciousness: alert  Complications: No apparent anesthesia complications

## 2013-04-07 NOTE — Anesthesia Preprocedure Evaluation (Addendum)
Anesthesia Evaluation  Patient identified by MRN, date of birth, ID band Patient awake    Reviewed: Allergy & Precautions, H&P , NPO status , Patient's Chart, lab work & pertinent test results  History of Anesthesia Complications (+) PONV  Airway Mallampati: II TM Distance: <3 FB Neck ROM: Full    Dental no notable dental hx. (+) Teeth Intact and Dental Advisory Given   Pulmonary shortness of breath, asthma ,  breath sounds clear to auscultation  Pulmonary exam normal       Cardiovascular Rhythm:Regular Rate:Normal     Neuro/Psych  Headaches, Anxiety Cerebral aneurysm  Neuromuscular disease    GI/Hepatic hiatal hernia, GERD-  Medicated and Controlled,  Endo/Other    Renal/GU      Musculoskeletal  (+) Fibromyalgia -  Abdominal (+) + obese,   Peds  Hematology   Anesthesia Other Findings Crowns. TMJ pain Sx  Reproductive/Obstetrics                         Anesthesia Physical Anesthesia Plan  ASA: III  Anesthesia Plan: General   Post-op Pain Management:    Induction: Intravenous  Airway Management Planned: Oral ETT  Additional Equipment: Arterial line  Intra-op Plan:   Post-operative Plan: Possible Post-op intubation/ventilation  Informed Consent: I have reviewed the patients History and Physical, chart, labs and discussed the procedure including the risks, benefits and alternatives for the proposed anesthesia with the patient or authorized representative who has indicated his/her understanding and acceptance.   Dental advisory given  Plan Discussed with: CRNA, Surgeon and Anesthesiologist  Anesthesia Plan Comments:        Anesthesia Quick Evaluation

## 2013-04-07 NOTE — ED Notes (Signed)
Short stay notified for bed 

## 2013-04-07 NOTE — Procedures (Signed)
S/P 4 vessel cerebral arteriogram  RT CFA approach. Findings. 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

## 2013-04-07 NOTE — Preoperative (Signed)
Beta Blockers   Reason not to administer Beta Blockers:Not Applicable 

## 2013-04-08 ENCOUNTER — Encounter (HOSPITAL_COMMUNITY): Payer: Self-pay | Admitting: Interventional Radiology

## 2013-04-10 DIAGNOSIS — L255 Unspecified contact dermatitis due to plants, except food: Secondary | ICD-10-CM | POA: Insufficient documentation

## 2013-04-10 NOTE — Assessment & Plan Note (Signed)
This is already fading. We discussed OTC topical preparations. Plan-Depo-Medrol

## 2013-04-15 ENCOUNTER — Ambulatory Visit (HOSPITAL_COMMUNITY)
Admission: RE | Admit: 2013-04-15 | Discharge: 2013-04-15 | Disposition: A | Discharge status: Home / Self Care | Payer: Medicare Other | Source: Ambulatory Visit | Attending: Interventional Radiology | Admitting: Interventional Radiology

## 2013-04-15 ENCOUNTER — Other Ambulatory Visit (HOSPITAL_COMMUNITY): Payer: Self-pay | Admitting: Interventional Radiology

## 2013-04-15 DIAGNOSIS — I729 Aneurysm of unspecified site: Secondary | ICD-10-CM

## 2013-04-20 ENCOUNTER — Other Ambulatory Visit (HOSPITAL_COMMUNITY): Payer: Self-pay | Admitting: Interventional Radiology

## 2013-04-20 ENCOUNTER — Ambulatory Visit (HOSPITAL_COMMUNITY)
Admission: RE | Admit: 2013-04-20 | Discharge: 2013-04-20 | Disposition: A | Discharge status: Home / Self Care | Payer: Medicare Other | Source: Ambulatory Visit | Attending: Interventional Radiology | Admitting: Interventional Radiology

## 2013-04-20 DIAGNOSIS — R1030 Lower abdominal pain, unspecified: Secondary | ICD-10-CM

## 2013-04-20 DIAGNOSIS — M79609 Pain in unspecified limb: Secondary | ICD-10-CM

## 2013-04-20 NOTE — Progress Notes (Signed)
*  PRELIMINARY RESULTS* Vascular Ultrasound Limited right Lower Extremity Arterial Duplex has been completed. The right common femoral artery is patent with no evidence of pseudoaneurysm. The right pelvic region was evaluated for patient peace of mind, revealing no obvious vascular abnormalities.  04/20/2013 2:59 PM Gertie Fey, RVT, RDCS, RDMS

## 2013-04-25 ENCOUNTER — Telehealth: Payer: Self-pay | Admitting: Internal Medicine

## 2013-04-25 NOTE — Telephone Encounter (Signed)
I spoke with pt. She was calling to get her flu shot scheduled. I have done so. Nothing further needed

## 2013-04-26 ENCOUNTER — Ambulatory Visit: Payer: Medicare Other

## 2013-11-26 ENCOUNTER — Encounter: Payer: Self-pay | Admitting: *Deleted

## 2013-12-16 ENCOUNTER — Other Ambulatory Visit (HOSPITAL_COMMUNITY): Payer: Self-pay | Admitting: Orthopedic Surgery

## 2013-12-16 ENCOUNTER — Ambulatory Visit
Admission: RE | Admit: 2013-12-16 | Discharge: 2013-12-16 | Disposition: A | Discharge status: Home / Self Care | Payer: Medicare Other | Source: Ambulatory Visit | Attending: Orthopedic Surgery | Admitting: Orthopedic Surgery

## 2013-12-16 ENCOUNTER — Other Ambulatory Visit: Payer: Self-pay | Admitting: Orthopedic Surgery

## 2013-12-16 DIAGNOSIS — M949 Disorder of cartilage, unspecified: Principal | ICD-10-CM

## 2013-12-16 DIAGNOSIS — M79606 Pain in leg, unspecified: Secondary | ICD-10-CM

## 2013-12-16 DIAGNOSIS — R609 Edema, unspecified: Secondary | ICD-10-CM

## 2013-12-16 DIAGNOSIS — M899 Disorder of bone, unspecified: Secondary | ICD-10-CM

## 2013-12-19 ENCOUNTER — Ambulatory Visit (HOSPITAL_COMMUNITY)
Admission: RE | Admit: 2013-12-19 | Discharge: 2013-12-19 | Disposition: A | Discharge status: Home / Self Care | Payer: Medicare Other | Source: Ambulatory Visit | Attending: Orthopedic Surgery | Admitting: Orthopedic Surgery

## 2013-12-19 ENCOUNTER — Telehealth: Payer: Self-pay | Admitting: Internal Medicine

## 2013-12-19 DIAGNOSIS — M949 Disorder of cartilage, unspecified: Secondary | ICD-10-CM

## 2013-12-19 DIAGNOSIS — Z13820 Encounter for screening for osteoporosis: Secondary | ICD-10-CM | POA: Insufficient documentation

## 2013-12-19 DIAGNOSIS — M899 Disorder of bone, unspecified: Secondary | ICD-10-CM | POA: Insufficient documentation

## 2013-12-19 DIAGNOSIS — Z780 Asymptomatic menopausal state: Secondary | ICD-10-CM | POA: Insufficient documentation

## 2013-12-19 NOTE — Telephone Encounter (Signed)
Attempted to call xLMTCB on VM. Pt requesting appointment with CY today or tomorrow due to itching. Last seen 03/29/13

## 2013-12-19 NOTE — Telephone Encounter (Signed)
Called made pt aware. Nothing further needed 

## 2013-12-19 NOTE — Telephone Encounter (Signed)
I can't offer any expertise for dealing with contact allergy to polyester. Other than wearing cotton next to the skin and taking an antihistamine, I can only suggest she go to a dermatologist about skin allergies.

## 2013-12-19 NOTE — Telephone Encounter (Signed)
Pt returned triage's call.  Holly D Pryor ° °

## 2013-12-19 NOTE — Telephone Encounter (Signed)
Called spoke with pt. C/o allergic reaction to polyester. She has ruled out several things to figure this out per pt. She reports also wants directions on how to help with this, how to cleanse skin (currently takes a shower 7 times daily and changing clothes every hr). She wants an [facility] appt to see CDY now. She has pending appt 01/04/14. Please advise Dr. [name] thanks  Allergies  Allergen Reactions  . Sulfonamide Derivatives Anaphylaxis  . Penicillins Rash    Underarms (both)  . Ciprofloxacin Other (See Comments)    unknown  . Corn-Containing Products Itching and Other (See Comments)    Can not walk if have a lot of it.  . Formoterol Fumarate Other (See Comments)    unknown  . Levofloxacin     REACTION: HEART RACING  . Wheat Other (See Comments)    Pain in joints  . Ibuprofen Rash  . Mold Extract [Trichophyton Mentagrophyte] Other (See Comments)    Bumps on back, stops up sinuses.  . Tylenol [Acetaminophen] Rash    On face     Current Outpatient Prescriptions on File Prior to Visit  Medication Sig Dispense Refill  . diphenhydrAMINE (BENADRYL) 25 mg capsule Take 25 mg by mouth every 6 (six) hours as needed for allergies.       Marland Kitchen esomeprazole (NEXIUM) 40 MG capsule Take 40 mg by mouth 2 (two) times daily.       . ondansetron (ZOFRAN-ODT) 4 MG disintegrating tablet Take 4 mg by mouth 3 (three) times daily as needed for nausea.      . polyethylene glycol (MIRALAX / GLYCOLAX) packet Take 17 g by mouth daily as needed (for constipation).       . TraMADol HCl (RYBIX ODT) 50 MG TBDP Take 25 mg by mouth daily as needed (for pain).       Marland Kitchen zolpidem (AMBIEN) 10 MG tablet Take 5 mg by mouth See admin instructions. 5 mg (1/2 tablet) every night at bedtime - may take another 1/2 tablet if needed for sleep       No current facility-administered medications on file prior to visit.

## 2014-01-04 ENCOUNTER — Encounter: Payer: Self-pay | Admitting: Internal Medicine

## 2014-01-04 ENCOUNTER — Ambulatory Visit (INDEPENDENT_AMBULATORY_CARE_PROVIDER_SITE_OTHER): Payer: Medicare Other | Admitting: Internal Medicine

## 2014-01-04 VITALS — BP 126/70 | HR 60 | Ht 66.0 in | Wt 189.4 lb

## 2014-01-04 DIAGNOSIS — J45909 Unspecified asthma, uncomplicated: Secondary | ICD-10-CM

## 2014-01-04 DIAGNOSIS — F5104 Psychophysiologic insomnia: Secondary | ICD-10-CM

## 2014-01-04 DIAGNOSIS — G4700 Insomnia, unspecified: Secondary | ICD-10-CM

## 2014-01-04 DIAGNOSIS — J309 Allergic rhinitis, unspecified: Secondary | ICD-10-CM

## 2014-01-04 DIAGNOSIS — L255 Unspecified contact dermatitis due to plants, except food: Secondary | ICD-10-CM

## 2014-01-04 DIAGNOSIS — L259 Unspecified contact dermatitis, unspecified cause: Secondary | ICD-10-CM

## 2014-01-04 MED ORDER — CLONAZEPAM 0.5 MG PO TABS: ORAL_TABLET | ORAL | Status: DC

## 2014-01-04 NOTE — Assessment & Plan Note (Signed)
Controlled and clear today

## 2014-01-04 NOTE — Assessment & Plan Note (Signed)
Difficulty maintaining sleep. Benadryl and ambien have not been sufficient, Try longer half-life product, counseling sleep hygiene. Plan- Clonazepam

## 2014-01-04 NOTE — Assessment & Plan Note (Addendum)
Patient blames polyester = usually associated dyes or fabric processing chemicals, rather than the fiber material per se. She is clearing polyester clothes out. Not sure she isn't over-focused on this.  Plan- ok to use antihistamine. Ask her dermatologist about contact patch testing for dyes etc.

## 2014-01-04 NOTE — Assessment & Plan Note (Signed)
No recent exposure

## 2014-01-04 NOTE — Patient Instructions (Addendum)
Script for clonazepam to try for sleep  Ask dermatologist about patch testing for skin contact allergy  Antihistamine as needed  Avoid polyester for awhile to see if that helps itching
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
## 2014-01-04 NOTE — Assessment & Plan Note (Signed)
Got through spring pollen season ok

## 2014-01-09 ENCOUNTER — Encounter (HOSPITAL_COMMUNITY): Payer: Medicare Other

## 2014-01-09 ENCOUNTER — Encounter: Payer: Medicare Other | Admitting: Surgery

## 2014-03-16 ENCOUNTER — Encounter: Payer: Self-pay | Admitting: Internal Medicine

## 2014-06-13 ENCOUNTER — Encounter: Payer: Self-pay | Admitting: Internal Medicine

## 2014-06-13 ENCOUNTER — Ambulatory Visit (INDEPENDENT_AMBULATORY_CARE_PROVIDER_SITE_OTHER): Payer: Medicare Other | Admitting: Internal Medicine

## 2014-06-13 VITALS — BP 122/86 | HR 71 | Ht 66.0 in | Wt 201.4 lb

## 2014-06-13 DIAGNOSIS — H6093 Unspecified otitis externa, bilateral: Secondary | ICD-10-CM | POA: Insufficient documentation

## 2014-06-13 DIAGNOSIS — Z23 Encounter for immunization: Secondary | ICD-10-CM

## 2014-06-13 DIAGNOSIS — H6983 Other specified disorders of Eustachian tube, bilateral: Secondary | ICD-10-CM

## 2014-06-13 DIAGNOSIS — J4521 Mild intermittent asthma with (acute) exacerbation: Secondary | ICD-10-CM

## 2014-06-13 MED ORDER — AZITHROMYCIN 250 MG PO TABS: ORAL_TABLET | ORAL | Status: DC

## 2014-06-13 NOTE — Patient Instructions (Signed)
Script sent for Z pak to repeat  Prevnar 13- pneumococcal vaccine  Order- referral to [facility]  Dx otitis externa with eustachian dysfunction

## 2014-06-13 NOTE — Assessment & Plan Note (Signed)
Recent acute exacerbation responding to Zithromax and prednisone. Limited medication tolerance. Plan-repeat Z-Pak to extend course of treatment, Prevnar vaccine with discussion

## 2014-06-13 NOTE — Assessment & Plan Note (Signed)
Canals are red and she is describing symptoms consistent with eustachian dysfunction. Given her difficulties with medication intolerance I suggest ENT referral before this drags on. Plan-Z-Pak, ENT referral
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
## 2014-10-27 ENCOUNTER — Other Ambulatory Visit: Payer: Self-pay | Admitting: Family Medicine

## 2014-10-27 DIAGNOSIS — M542 Cervicalgia: Secondary | ICD-10-CM

## 2014-10-27 DIAGNOSIS — L720 Epidermal cyst: Secondary | ICD-10-CM

## 2014-10-27 DIAGNOSIS — L729 Follicular cyst of the skin and subcutaneous tissue, unspecified: Secondary | ICD-10-CM

## 2014-11-01 ENCOUNTER — Ambulatory Visit
Admission: RE | Admit: 2014-11-01 | Discharge: 2014-11-01 | Disposition: A | Discharge status: Home / Self Care | Payer: Medicare Other | Source: Ambulatory Visit | Attending: Family Medicine | Admitting: Family Medicine

## 2014-11-01 DIAGNOSIS — L720 Epidermal cyst: Secondary | ICD-10-CM

## 2014-11-01 DIAGNOSIS — L729 Follicular cyst of the skin and subcutaneous tissue, unspecified: Secondary | ICD-10-CM

## 2014-11-06 ENCOUNTER — Other Ambulatory Visit: Payer: Self-pay

## 2014-11-07 ENCOUNTER — Other Ambulatory Visit: Payer: Self-pay

## 2014-11-08 ENCOUNTER — Ambulatory Visit
Admission: RE | Admit: 2014-11-08 | Discharge: 2014-11-08 | Disposition: A | Discharge status: Home / Self Care | Payer: Medicare Other | Source: Ambulatory Visit | Attending: Family Medicine | Admitting: Family Medicine

## 2014-11-08 DIAGNOSIS — M542 Cervicalgia: Secondary | ICD-10-CM

## 2014-12-12 ENCOUNTER — Encounter: Payer: Self-pay | Admitting: Internal Medicine

## 2014-12-12 ENCOUNTER — Other Ambulatory Visit: Payer: Self-pay | Admitting: General Surgery

## 2014-12-12 ENCOUNTER — Ambulatory Visit (INDEPENDENT_AMBULATORY_CARE_PROVIDER_SITE_OTHER): Payer: Medicare Other | Admitting: Internal Medicine

## 2014-12-12 VITALS — BP 124/62 | HR 80 | Ht 66.0 in | Wt 202.0 lb

## 2014-12-12 DIAGNOSIS — J302 Other seasonal allergic rhinitis: Secondary | ICD-10-CM

## 2014-12-12 DIAGNOSIS — G4700 Insomnia, unspecified: Secondary | ICD-10-CM | POA: Diagnosis not present

## 2014-12-12 DIAGNOSIS — J3089 Other allergic rhinitis: Secondary | ICD-10-CM

## 2014-12-12 DIAGNOSIS — J309 Allergic rhinitis, unspecified: Secondary | ICD-10-CM

## 2014-12-12 MED ORDER — TRAZODONE HCL 50 MG PO TABS: ORAL_TABLET | ORAL | Status: DC

## 2014-12-12 NOTE — Assessment & Plan Note (Signed)
Chronic insomnia. We emphasized good sleep habits and reasonable expectations. Plan-try trazodone with melatonin for sleep

## 2014-12-12 NOTE — Progress Notes (Signed)
07/04/11- 69 year old female never smoker followed for bronchitis, allergic rhinitis, complicated by GERD and multiple medical problems. Here with husband.Marland Kitchen Has had flu vaccine. She says her primary practitioner doesn't treat bronchitis and sent her here (!). She began with a sore throat 7 or 8 days ago, progressive with sneeze head and chest congestion. Neighbor was burning something outdoors and does smoke was a further irritant. Now more short of breath than usual, cough productive of yellow sputum. Denies fever. Sore throat is gone. Ears hurt some.  12/25/11-  70 year old female never smoker followed for bronchitis, allergic rhinitis, complicated by GERD and multiple medical problems. Here with husband.. Doing okay with breathing at this time; still has issues with breathing when around certain scents. Z-Pak and Depo-Medrol help last visit. She is very sensitive to odors including exhaust, perfumes. She attributes her problems to corn, to the extent that if she takes any material, in anyway, was made with or contains any sort of corn extract , that she will have symptoms. Fragrances caused facial tingling and itching without visible rash.  she says she is afraid of any generic products because "they are more likely to be made with corn".   03/30/12-  69 year old female never smoker followed for bronchitis, allergic rhinitis, complicated by GERD and multiple medical problems. Here with husband..  No longer using QVAR-doesnt feel like she needs it; states doing pretty well overall; Having more issues with exposure to mold-can tell if she is around it-has head congestion and thick phlegm with it. 13 days ago she cleaned a friend's basement where there was mold and she took Benadryl. Got an industrial mask she carries with her and her car  07/20/12- 69 year old female never smoker followed for bronchitis, allergic rhinitis, complicated by GERD and multiple medical problems. Here with husband.. FOLLOWS  FOR: stuffiness in nasal area, achy in sinus, bringing up light green colored phelgm and a cough. Would like to have Tussionex RX (large bottle) Nasal congestion and bilateral maxillary soreness for 3 days with yellow-green postnasal drip. Denies fever. Dymista nasal spray was not sufficient. Asks refill Ambien for her chronic insomnia. We discussed sleep habits.  03/29/13- 69 year old female never smoker followed for bronchitis, allergic rhinitis, complicated by GERD and multiple medical problems. Pending evaluation for cerebral aneurysm. ACUTE VISIT: unsure if she was exposed to [place] yesterday; has red, swollen bumps on arms. Has used Benadryl to help(causes her to be sleepy)  01/04/14- 69 year old female never smoker followed for bronchitis, allergic rhinitis, complicated by GERD and multiple medical problems. 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 Pt also states she is having trouble with sleep as well(gets about 3-4 hours each night of sleep). Recognized stinging,burning, itching, minimal visible rash after handling new blanket from [place]. Now attributes symptoms to all polyester. Using cotton fabrics. 7 showers/ day- no soap. Insomnia- difficulty initiating and maintaining sleep- gets 3-4 hr/ night x months. Ambien or liquid benadryl some help.   06/13/14- 69 year old female never smoker followed for bronchitis, allergic rhinitis, complicated by GERD and multiple medical problems.   Husband here FOLLOWS FOR: Pt c/o congestion, dark green mucus, dizziness, cough and wheezing. Seen by walk-in clinic 06/11/14 given steroid injection, labs run to check for PNA. walk-in clinic / PCP gave zithromycin 06/09/14, completed. Getting better-less cough no longer productive and no longer wheezing. She is worried about her history of  prolonged winter  infections. Complains of pain and pressure in ears with decreased hearing left ear. Reports workup for cerebral aneurysm but treatment not offered because of her reported allergy to corn products which apparently were involved in all of the treatment modalities considered.  12/12/14- 69 year old female never smoker followed for bronchitis, allergic rhinitis, chronic insomnia, complicated by GERD and multiple medical problems.   Husband here FOLLOWS FOR: c/o of sinus drainage, prod cough with clear/light yellow mucus, dizziness.  Would like to discuss going back to zolpidem at bedtime.  She is working with ENT. Had myringotomy tubes which had to be removed because of some kind of reaction. She is following up with Dr. Wilburn Cornelia this week. Complains of pulsatile tinnitus left ear only, bilateral feeling of ears and nose "clogged and draining" with postnasal drip. Pending surgical resection of a mass on her right buttock. She is not very much bothered by her rhinitis and postnasal drip, and is not coughing much. Her main concern today is management of chronic insomnia. We discussed sleep habits again. She was given Seroquel which made her unsteady on her feet with fear of falling. She wants to go back to Ambien which she was using at 10 mg per night. We discussed alternatives. She again focuses on avoiding products with alcohol or cornstarch.  ROS-see HPI Constitutional:   No-   weight loss, night sweats, fevers, chills, fatigue, lassitude. HEENT:   No-  headaches, difficulty swallowing, tooth/dental problems, sore throat,       No- sneezing, no-itching, +ear ache, +nasal congestion, +post nasal drip,  CV:  No-   chest pain, orthopnea, PND, swelling in lower extremities, anasarca,  dizziness, palpitations Resp: + shortness of breath with exertion or at rest.              No-  productive cough,  No non-productive cough,  No- coughing up of blood.              No-   change in color of mucus.  No-  wheezing.   Skin: Per HPI GI:  No-   heartburn, indigestion, abdominal pain, nausea, vomiting,  GU:  MS:  No-   joint pain or swelling.   Neuro-     nothing unusual Psych:  No- change in mood or affect. No depression or anxiety.  No memory loss.  OBJ General- Alert, Oriented, Affect-appropriate, Distress- none acute.+, talkative. Skin- no rash seen Lymphadenopathy- none Head- atraumatic            Eyes- Gross vision intact, PERRLA, conjunctivae clear secretions.            Ears- Hearing grossly normal,             Nose- clear, no-Septal dev, polyps, erosion, perforation             Throat- Mallampati II , mucosa red , drainage- none, tonsils- atrophic Neck- flexible , trachea midline, no stridor , thyroid nl, carotid no bruit Chest - symmetrical excursion , unlabored           Heart/CV- RRR , no murmur , no gallop  , no rub, nl s1 s2                           - JVD- none , edema- none, stasis changes- none, varices- none           Lung- clear, wheeze- none, cough- none , dullness-none, rub- none  Chest wall-  Abd-  Br/ Gen/ Rectal- Not done, not indicated Extrem- cyanosis- none, clubbing, none, atrophy- none, strength- nl Neuro- grossly intact to observation

## 2014-12-12 NOTE — Assessment & Plan Note (Signed)
She is working with Dr. Lovenia Shuck now with primary attention to her ears. We're getting year the end of the spring pollen season. If she continues to have problems with rhinitis and drainage at her next visit a told her that I would work with her again on this problem.

## 2014-12-12 NOTE — Patient Instructions (Addendum)
Script to try trazodone for sleep You can try combining it with melatonin 6-12 mg at bedtime to help your brain get ready for sleep

## 2014-12-13 ENCOUNTER — Encounter (HOSPITAL_COMMUNITY): Payer: Self-pay

## 2014-12-13 ENCOUNTER — Encounter (HOSPITAL_COMMUNITY)
Admission: RE | Admit: 2014-12-13 | Discharge: 2014-12-13 | Disposition: A | Discharge status: Home / Self Care | Payer: Medicare Other | Source: Ambulatory Visit | Attending: General Surgery | Admitting: General Surgery

## 2014-12-13 DIAGNOSIS — Z01812 Encounter for preprocedural laboratory examination: Secondary | ICD-10-CM | POA: Insufficient documentation

## 2014-12-13 DIAGNOSIS — L989 Disorder of the skin and subcutaneous tissue, unspecified: Secondary | ICD-10-CM | POA: Diagnosis not present

## 2014-12-13 HISTORY — DX: Unspecified asthma, uncomplicated: J45.909

## 2014-12-13 LAB — COMPREHENSIVE METABOLIC PANEL
ALT: 15 U/L (ref 14–54)
AST: 18 U/L (ref 15–41)
Albumin: 3.6 g/dL (ref 3.5–5.0)
Alkaline Phosphatase: 65 U/L (ref 38–126)
Anion gap: 12 (ref 5–15)
BUN: 12 mg/dL (ref 6–20)
CALCIUM: 8.9 mg/dL (ref 8.9–10.3)
CO2: 22 mmol/L (ref 22–32)
CREATININE: 0.91 mg/dL (ref 0.44–1.00)
Chloride: 105 mmol/L (ref 101–111)
GFR calc Af Amer: >60 mL/min (ref >60)
GFR calc non Af Amer: >60 mL/min (ref >60)
GLUCOSE: 89 mg/dL (ref 70–99)
Potassium: 4.1 mmol/L (ref 3.5–5.1)
Sodium: 139 mmol/L (ref 135–145)
TOTAL PROTEIN: 6.9 g/dL (ref 6.5–8.1)
Total Bilirubin: 0.5 mg/dL (ref 0.3–1.2)

## 2014-12-13 LAB — CBC
HEMATOCRIT: 44.1 % (ref 36.0–46.0)
HEMOGLOBIN: 14.8 g/dL (ref 12.0–15.0)
MCH: 31.1 pg (ref 26.0–34.0)
MCHC: 33.6 g/dL (ref 30.0–36.0)
MCV: 92.6 fL (ref 78.0–100.0)
Platelets: 256 10*3/uL (ref 150–400)
RBC: 4.76 MIL/uL (ref 3.87–5.11)
RDW: 14.2 % (ref 11.5–15.5)
WBC: 8.4 10*3/uL (ref 4.0–10.5)

## 2014-12-13 NOTE — Pre-Procedure Instructions (Signed)
TARINA VOLK  12/13/2014   Your procedure is scheduled on:  Monday, Dec 25, 2014  Report to [facility] Admitting at 8:30 AM.  Call this number if you have problems the morning of surgery: [phone]   Remember:   Do not eat food or drink liquids after midnight Sunday, Dec 24, 2014   Take these medicines the morning of surgery with A SIP OF WATER: if needed: TraMADol ( RYBIX ODT)  for pain, ondansetron (ZOFRAN-ODT) for nausea  Stop taking Aspirin, vitamins and herbal medications such as Omega-3 Fatty Acids (FISH OIL).  Do not take any NSAIDs ie: Ibuprofen, Advil, Naproxen or any medication containing Aspirin; stop 1 week prior to procedure ( Monday, Dec 18, 2014 )    Do not wear jewelry, make-up or nail polish.  Do not wear lotions, powders, or perfumes. You may not wear deodorant.  Do not shave 48 hours prior to surgery.   Do not bring valuables to the hospital.  [facility] is not responsible for any belongings or valuables.               Contacts, dentures or bridgework may not be worn into surgery.  Leave suitcase in the car. After surgery it may be brought to your room.  For patients admitted to the hospital, discharge time is determined by your treatment team.               Patients discharged the day of surgery will not be allowed to drive home.  Name and phone number of your driver:   Special Instructions:  Special Instructions:Special Instructions: [facility] - Preparing for Surgery  Before surgery, you can play an important role.  Because skin is not sterile, your skin needs to be as free of germs as possible.  You can reduce the number of germs on you skin by washing with CHG (chlorahexidine gluconate) soap before surgery.  CHG is an antiseptic cleaner which kills germs and bonds with the skin to continue killing germs even after washing.  Please DO NOT use if you have an allergy to CHG or antibacterial soaps.  If your skin becomes reddened/irritated stop  using the CHG and inform your nurse when you arrive at Short Stay.  Do not shave (including legs and underarms) for at least 48 hours prior to the first CHG shower.  You may shave your face.  Please follow these instructions carefully:   1.  Shower with CHG Soap the night before surgery and the morning of Surgery.  2.  If you choose to wash your hair, wash your hair first as usual with your normal shampoo.  3.  After you shampoo, rinse your hair and body thoroughly to remove the Shampoo.  4.  Use CHG as you would any other liquid soap.  You can apply chg directly  to the skin and wash gently with scrungie or a clean washcloth.  5.  Apply the CHG Soap to your body ONLY FROM THE NECK DOWN.  Do not use on open wounds or open sores.  Avoid contact with your eyes, ears, mouth and genitals (private parts).  Wash genitals (private parts) with your normal soap.  6.  Wash thoroughly, paying special attention to the area where your surgery will be performed.  7.  Thoroughly rinse your body with warm water from the neck down.  8.  DO NOT shower/wash with your normal soap after using and rinsing off the CHG Soap.  9.  Pat yourself dry with a clean towel.            10.  Wear clean pajamas.            11.  Place clean sheets on your bed the night of your first shower and do not sleep with pets.  Day of Surgery  Do not apply any lotions/deodorants the morning of surgery.  Please wear clean clothes to the hospital/surgery center.   Please read over the following fact sheets that you were given: Pain Booklet, Coughing and Deep Breathing and Surgical Site Infection Prevention

## 2014-12-13 NOTE — Progress Notes (Signed)
Pt denies SOB, chest pain, and being under the care of a cardiologist. Pt denies having a cardiac cath. Pt denies having an EKG and chest x ray within the last year. Pt to bring cotton socks and blanket from home due to polyester intolerance ( per Levada Dy). Please see either Opal Sidles or [place] on  for Investment banker, operational on DOS.

## 2014-12-19 ENCOUNTER — Other Ambulatory Visit: Payer: Self-pay | Admitting: Otolaryngology

## 2014-12-19 DIAGNOSIS — H93A2 Pulsatile tinnitus, left ear: Secondary | ICD-10-CM

## 2014-12-25 ENCOUNTER — Encounter (HOSPITAL_COMMUNITY): Admission: RE | Payer: Self-pay | Source: Ambulatory Visit

## 2014-12-25 ENCOUNTER — Ambulatory Visit (HOSPITAL_COMMUNITY): Admission: RE | Admit: 2014-12-25 | Payer: Medicare Other | Source: Ambulatory Visit | Admitting: General Surgery

## 2014-12-25 SURGERY — EXCISION MASS
Anesthesia: Monitor Anesthesia Care | Site: Buttocks | Laterality: Right

## 2015-01-04 ENCOUNTER — Encounter: Payer: Self-pay | Admitting: Internal Medicine

## 2015-01-09 ENCOUNTER — Ambulatory Visit
Admission: RE | Admit: 2015-01-09 | Discharge: 2015-01-09 | Disposition: A | Discharge status: Home / Self Care | Payer: Medicare Other | Source: Ambulatory Visit | Attending: Otolaryngology | Admitting: Otolaryngology

## 2015-01-09 DIAGNOSIS — H93A2 Pulsatile tinnitus, left ear: Secondary | ICD-10-CM

## 2015-01-09 MED ORDER — GADOBENATE DIMEGLUMINE 529 MG/ML IV SOLN: 19.0000 mL | Freq: Once | INTRAVENOUS | Status: AC | PRN

## 2015-02-22 ENCOUNTER — Telehealth: Payer: Self-pay | Admitting: Internal Medicine

## 2015-02-22 MED ORDER — ZOLPIDEM TARTRATE 5 MG PO TABS: 5.0000 mg | ORAL_TABLET | Freq: Every evening | ORAL | Status: DC | PRN

## 2015-02-22 NOTE — Telephone Encounter (Signed)
Called and spoke with pt about rec of CY  Pt stated that she has already tried reducing her trazodone to 1/2 tablet with no success Pt stated that she was told by her pharmacy that a PA would need to be done for ambien. Informed pt that office would call pharmacy to verify whether prior authorization needed to be done.  Called and spoke with Amy, pharmacist, with [facility] Order was given for Ambien 5mg  #30 with 5 refills Informed by pharmacist that medication would need PA   To initiate PA # was given as [phone] Member ID # [id number]  Dr [name], please advise if you are ok with initiating prior authorization or if there are further rec. Thanks

## 2015-02-22 NOTE — Telephone Encounter (Signed)
She can try reducing her trazodone to 1/2 tab = 25 mg at bedtime.  Or-- Ok to Rx ambien 5 mg, # 30, 1 at bedtime if needed, ref x 5

## 2015-02-22 NOTE — Telephone Encounter (Signed)
Spoke with Donna Bailey- states that she is having trouble with her sleep meds.  Donna Bailey takes Trazodone 50mg  nightly.  Reports excessive sleepiness and lethargy.  Donna Bailey states that she has been having very "wild" dreams. ' Donna Bailey has not tried Melatonin with Trazodone as directed.   Donna Bailey wanting to know if she can be put back on Ambien as she has tried this in the past and it has worked well for her.   Please advise Dr [name]. Thanks.   Allergies  Allergen Reactions  . Sulfonamide Derivatives Anaphylaxis  . Penicillins Rash    Underarms (both)  . Adhesive [Tape]   . Allegra [Fexofenadine]   . Avelox [Moxifloxacin]   . Caffeine     Heart race  . Cephalexin   . Ciprofloxacin Other (See Comments)    unknown  . Corn-Containing Products Itching and Other (See Comments)    Can not walk if have a lot of it.  . Crestor [Rosuvastatin]   . Formoterol Fumarate Other (See Comments)    unknown  . Levofloxacin     REACTION: HEART RACING  . Macrodantin [Nitrofurantoin Macrocrystal]   . Neomycin Sulfate [Neomycin]   . Nickel   . Ofloxacin   . Other     Polyester-"pins sticking in her skin" Gold  . Sulfa Antibiotics   . Wheat Other (See Comments)    Pain in joints  . Ibuprofen Rash  . Mold Extract [Trichophyton Mentagrophyte] Other (See Comments)    Bumps on back, stops up sinuses.  . Tylenol [Acetaminophen] Rash    On face     Medication List       This list is accurate as of: 02/22/15 10:52 AM.  Always use your most recent med list.               diphenhydrAMINE 25 mg capsule  Commonly known as:  BENADRYL  Take 25 mg by mouth every 6 (six) hours as needed for allergies.     FISH OIL PO  Take 820 mg by mouth daily.     ondansetron 4 MG disintegrating tablet  Commonly known as:  ZOFRAN-ODT  Take 4 mg by mouth 3 (three) times daily as needed for nausea.     PRESCRIPTION MEDICATION  Apply 1 application topically 3 (three) times daily. Gabapentin cream     QUEtiapine 50 MG  tablet  Commonly known as:  SEROQUEL  Take 50 mg by mouth at bedtime.     RYBIX ODT 50 MG Tbdp  Generic drug:  TraMADol HCl  Take 25 mg by mouth daily as needed (for pain).     traZODone 50 MG tablet  Commonly known as:  DESYREL  1 or 2 at bedtime for sleep     VITAMIN B-12 IJ  Inject as directed every 30 (thirty) days.

## 2015-02-22 NOTE — Telephone Encounter (Signed)
Ok to initiate PA for [employer]. Patient failed trazodone and other meds tried previously

## 2015-02-22 NOTE — Telephone Encounter (Signed)
Called to initiate PA for ambien. This will go into clinical review. Will forward to Katie to f/u on

## 2015-02-22 NOTE — Telephone Encounter (Signed)
LMTCB x 1 

## 2015-02-26 NOTE — Telephone Encounter (Signed)
Pt aware that Belsomra 15mg  samples are going to be placed up front. Aware that she must try/fail Belsomra and Rozerem for before getting Ambien coverage.  Pt expressed understanding and will let us know how she tolerates the new medication.  Nothing further needed.

## 2015-02-26 NOTE — Telephone Encounter (Signed)
Explain to patient that her insurance won't consider paying for ambien until we document that she has tried Belsomra and Rozerem for sleep.   If one of them works fore her, that will be great. She can come pick up sample pack x2 of Belsomra 15 mg, 1 at bedtime for sleep.

## 2015-02-26 NOTE — Telephone Encounter (Signed)
Joellen Jersey - has this been done? Ok to close encounter?

## 2015-02-26 NOTE — Telephone Encounter (Signed)
Ambien has been denied: Pt must try and fail (1)Belsomra (2) Rozerem both. Provider must provide adequate monitoring plan for adverse reactions with use of this high risk medication.

## 2015-04-03 ENCOUNTER — Telehealth: Payer: Self-pay | Admitting: Internal Medicine

## 2015-04-03 MED ORDER — RAMELTEON 8 MG PO TABS: 8.0000 mg | ORAL_TABLET | Freq: Every day | ORAL | Status: DC

## 2015-04-03 NOTE — Telephone Encounter (Signed)
CY-please advise on Rx for Rozerem(dose and sig) as we do not have any samples in the office. Thanks.

## 2015-04-03 NOTE — Telephone Encounter (Signed)
Please see if we have any samples of Rozerem for sleep- 1 at bedtime. If not, we will write Donna Bailey a trial script for it. Donna Bailey insurance stipulated she try Belsomra and Rozerem before considering ambien.

## 2015-04-03 NOTE — Telephone Encounter (Signed)
Patient requesting samples of Belsomra.   She says that the Belsomra will not put her to sleep, but once she goes to sleep it keeps her asleep for 5 hours. Patient says that she can try another medication if we have any samples.  She said that she is suppose to try different medications in order to get her Ambien approved.   [facility] [address]  Allergies  Allergen Reactions  . Sulfonamide Derivatives Anaphylaxis  . Penicillins Rash    Underarms (both)  . Adhesive [Tape]   . Allegra [Fexofenadine]   . Avelox [Moxifloxacin]   . Caffeine     Heart race  . Cephalexin   . Ciprofloxacin Other (See Comments)    unknown  . Corn-Containing Products Itching and Other (See Comments)    Can not walk if have a lot of it.  . Crestor [Rosuvastatin]   . Formoterol Fumarate Other (See Comments)    unknown  . Levofloxacin     REACTION: HEART RACING  . Macrodantin [Nitrofurantoin Macrocrystal]   . Neomycin Sulfate [Neomycin]   . Nickel   . Ofloxacin   . Other     Polyester-"pins sticking in her skin" Gold  . Sulfa Antibiotics   . Wheat Other (See Comments)    Pain in joints  . Ibuprofen Rash  . Mold Extract [Trichophyton Mentagrophyte] Other (See Comments)    Bumps on back, stops up sinuses.  . Tylenol [Acetaminophen] Rash    On face   Current Outpatient Prescriptions on File Prior to Visit  Medication Sig Dispense Refill  . Cyanocobalamin (VITAMIN B-12 IJ) Inject as directed every 30 (thirty) days.    . diphenhydrAMINE (BENADRYL) 25 mg capsule Take 25 mg by mouth every 6 (six) hours as needed for allergies.     . Omega-3 Fatty Acids (FISH OIL PO) Take 820 mg by mouth daily.    . ondansetron (ZOFRAN-ODT) 4 MG disintegrating tablet Take 4 mg by mouth 3 (three) times daily as needed for nausea.    Marland Kitchen PRESCRIPTION MEDICATION Apply 1 application topically 3 (three) times daily. Gabapentin cream    . QUEtiapine (SEROQUEL) 50 MG tablet Take 50 mg by mouth at bedtime.    . TraMADol  HCl (RYBIX ODT) 50 MG TBDP Take 25 mg by mouth daily as needed (for pain).     . traZODone (DESYREL) 50 MG tablet 1 or 2 at bedtime for sleep (Patient taking differently: Take 25 mg by mouth at bedtime. 1 or 2 at bedtime for sleep) 30 tablet 5  . zolpidem (AMBIEN) 5 MG tablet Take 1 tablet (5 mg total) by mouth at bedtime as needed for sleep. 30 tablet 5   No current facility-administered medications on file prior to visit.

## 2015-04-03 NOTE — Telephone Encounter (Signed)
Offer Rozerem 8 mg, # 30, 1 before bedtime for sleep, refill x 2

## 2015-04-03 NOTE — Telephone Encounter (Signed)
Spoke with patient-she has agreed to [employer]; I have called the rx to [place]. Nothing more needed at this time.

## 2015-04-17 ENCOUNTER — Ambulatory Visit (INDEPENDENT_AMBULATORY_CARE_PROVIDER_SITE_OTHER): Payer: Medicare Other | Admitting: Internal Medicine

## 2015-04-17 ENCOUNTER — Encounter: Payer: Self-pay | Admitting: Internal Medicine

## 2015-04-17 VITALS — BP 128/80 | HR 81 | Ht 66.0 in | Wt 207.2 lb

## 2015-04-17 DIAGNOSIS — J309 Allergic rhinitis, unspecified: Secondary | ICD-10-CM

## 2015-04-17 DIAGNOSIS — J4521 Mild intermittent asthma with (acute) exacerbation: Secondary | ICD-10-CM

## 2015-04-17 DIAGNOSIS — G4700 Insomnia, unspecified: Secondary | ICD-10-CM

## 2015-04-17 DIAGNOSIS — Z23 Encounter for immunization: Secondary | ICD-10-CM

## 2015-04-17 DIAGNOSIS — J302 Other seasonal allergic rhinitis: Secondary | ICD-10-CM

## 2015-04-17 DIAGNOSIS — J3089 Other allergic rhinitis: Principal | ICD-10-CM

## 2015-04-17 MED ORDER — LEVALBUTEROL HCL 0.63 MG/3ML IN NEBU
0.6300 mg | INHALATION_SOLUTION | Freq: Once | RESPIRATORY_TRACT | Status: AC
  Administered 2015-04-17: 0.63 mg via RESPIRATORY_TRACT

## 2015-04-17 NOTE — Assessment & Plan Note (Signed)
Currently well controlled without recent wheeze or use of rescue inhaler

## 2015-04-17 NOTE — Progress Notes (Signed)
07/04/11- 69 year old female never smoker followed for bronchitis, allergic rhinitis, complicated by GERD and multiple medical problems. Here with husband.Marland Kitchen Has had flu vaccine. She says her primary practitioner doesn't treat bronchitis and sent her here (!). She began with a sore throat 7 or 8 days ago, progressive with sneeze head and chest congestion. Neighbor was burning something outdoors and does smoke was a further irritant. Now more short of breath than usual, cough productive of yellow sputum. Denies fever. Sore throat is gone. Ears hurt some.  12/25/11-  70 year old female never smoker followed for bronchitis, allergic rhinitis, complicated by GERD and multiple medical problems. Here with husband.. Doing okay with breathing at this time; still has issues with breathing when around certain scents. Z-Pak and Depo-Medrol help last visit. She is very sensitive to odors including exhaust, perfumes. She attributes her problems to corn, to the extent that if she takes any material, in anyway, was made with or contains any sort of corn extract , that she will have symptoms. Fragrances caused facial tingling and itching without visible rash.  she says she is afraid of any generic products because "they are more likely to be made with corn".   03/30/12-  69 year old female never smoker followed for bronchitis, allergic rhinitis, complicated by GERD and multiple medical problems. Here with husband..  No longer using QVAR-doesnt feel like she needs it; states doing pretty well overall; Having more issues with exposure to mold-can tell if she is around it-has head congestion and thick phlegm with it. 13 days ago she cleaned a friend's basement where there was mold and she took Benadryl. Got an industrial mask she carries with her and her car  07/20/12- 69 year old female never smoker followed for bronchitis, allergic rhinitis, complicated by GERD and multiple medical problems. Here with husband.. FOLLOWS  FOR: stuffiness in nasal area, achy in sinus, bringing up light green colored phelgm and a cough. Would like to have Tussionex RX (large bottle) Nasal congestion and bilateral maxillary soreness for 3 days with yellow-green postnasal drip. Denies fever. Dymista nasal spray was not sufficient. Asks refill Ambien for her chronic insomnia. We discussed sleep habits.  03/29/13- 69 year old female never smoker followed for bronchitis, allergic rhinitis, complicated by GERD and multiple medical problems. Pending evaluation for cerebral aneurysm. ACUTE VISIT: unsure if she was exposed to [place] yesterday; has red, swollen bumps on arms. Has used Benadryl to help(causes her to be sleepy)  01/04/14- 69 year old female never smoker followed for bronchitis, allergic rhinitis, complicated by GERD and multiple medical problems. 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 Pt also states she is having trouble with sleep as well(gets about 3-4 hours each night of sleep). Recognized stinging,burning, itching, minimal visible rash after handling new blanket from [place]. Now attributes symptoms to all polyester. Using cotton fabrics. 7 showers/ day- no soap. Insomnia- difficulty initiating and maintaining sleep- gets 3-4 hr/ night x months. Ambien or liquid benadryl some help.   06/13/14- 69 year old female never smoker followed for bronchitis, allergic rhinitis, complicated by GERD and multiple medical problems.   Husband here FOLLOWS FOR: Pt c/o congestion, dark green mucus, dizziness, cough and wheezing. Seen by walk-in clinic 06/11/14 given steroid injection, labs run to check for PNA. walk-in clinic / PCP gave zithromycin 06/09/14, completed. Getting better-less cough no longer productive and no longer wheezing. She is worried about her history of  prolonged winter  infections. Complains of pain and pressure in ears with decreased hearing left ear. Reports workup for cerebral aneurysm but treatment not offered because of her reported allergy to corn products which apparently were involved in all of the treatment modalities considered.  12/12/14- 69 year old female never smoker followed for bronchitis, allergic rhinitis, chronic insomnia, complicated by GERD and multiple medical problems.   Husband here FOLLOWS FOR: c/o of sinus drainage, prod cough with clear/light yellow mucus, dizziness.  Would like to discuss going back to zolpidem at bedtime.  She is working with ENT. Had myringotomy tubes which had to be removed because of some kind of reaction. She is following up with Dr. Wilburn Cornelia this week. Complains of pulsatile tinnitus left ear only, bilateral feeling of ears and nose "clogged and draining" with postnasal drip. Pending surgical resection of a mass on her right buttock. She is not very much bothered by her rhinitis and postnasal drip, and is not coughing much. Her main concern today is management of chronic insomnia. We discussed sleep habits again. She was given Seroquel which made her unsteady on her feet with fear of falling. She wants to go back to Ambien which she was using at 10 mg per night. We discussed alternatives. She again focuses on avoiding products with alcohol or cornstarch.  04/17/15- 69 year old female never smoker followed for bronchitis, allergic rhinitis, chronic insomnia, intolerance of all corn products complicated by GERD and multiple medical problems.  Follows For: pt states medication Rozerem  is not helping put her to sleep or stay asleep it makes her feel  dizzy. pt states the Belsomra helps her sleep all night but feels like a zombie and cant function when she wakes up. pt states shes been congested for about a month, sneezing and dry cough. Of available antihistamines, she says she can only take Benadryl because all of this  have corn in the him. Doesn't do well with nasal sprays generally. Insomnia problems continue. Seroquel made her too dizzy. Belsomra 15 mg made her muscles stiff and left her groggy but did help her sleep. Rozerem did not help with sleep but made her dizzy.   ROS-see HPI Constitutional:   No-   weight loss, night sweats, fevers, chills, fatigue, lassitude. HEENT:   No-  headaches, difficulty swallowing, tooth/dental problems, sore throat,       No- sneezing, no-itching, +ear ache, +nasal congestion, +post nasal drip,  CV:  No-   chest pain, orthopnea, PND, swelling in lower extremities, anasarca,  dizziness, palpitations Resp: + shortness of breath with exertion or at rest.              No-  productive cough,  No non-productive cough,  No- coughing up of blood.              No-   change in color of mucus.  No- wheezing.   Skin: Per HPI GI:  No-   heartburn, indigestion, abdominal pain, nausea, vomiting,  GU:  MS:  No-   joint pain or swelling.   Neuro-     nothing unusual Psych:  No- change in mood or affect. No depression or anxiety.  No memory loss.  OBJ General- Alert, Oriented, Affect-appropriate, Distress- none acute.+, talkative. Skin- no rash seen Lymphadenopathy- none Head- atraumatic            Eyes- Gross vision intact, PERRLA, conjunctivae clear secretions.            Ears- Hearing grossly normal,  Nose- clear, no-Septal dev, polyps, erosion, perforation             Throat- Mallampati II , mucosa red , drainage- none, tonsils- atrophic Neck- flexible , trachea midline, no stridor , thyroid nl, carotid no bruit Chest - symmetrical excursion , unlabored           Heart/CV- RRR , no murmur , no gallop  , no rub, nl s1 s2                           - JVD- none , edema- none, stasis changes- none, varices- none           Lung- clear, wheeze- none, cough- none , dullness-none, rub- none           Chest wall-  Abd-  Br/ Gen/ Rectal- Not done, not indicated Extrem-  cyanosis- none, clubbing, none, atrophy- none, strength- nl Neuro- grossly intact to observation

## 2015-04-17 NOTE — Assessment & Plan Note (Signed)
I suggested she try saline nasal spray or gel, and continue Benadryl as needed. I don't have an organic basis to explain her intolerance of any product she thinks has corn.

## 2015-04-17 NOTE — Patient Instructions (Addendum)
Given sample Belsomra 10 mg    Try 1/4 or 1/2 tab for sleep as discussed   Try saline nasal spray as needed flor stuffy head  Flu vax  Neb xop 0.63    Dx asthma with bronchitis

## 2015-04-17 NOTE — Assessment & Plan Note (Signed)
She details intolerance to Seroquel, Rozerem. Says 15 mg Belsomra made her too groggy in the morning and made her muscles stiff, but did help her sleep soundly. We suggested trying smaller dose. Plan-sample Belsomra 10 mg. Start with half tablet and increase as tolerated. Plan-flu vaccine

## 2015-05-07 ENCOUNTER — Telehealth: Payer: Self-pay | Admitting: Internal Medicine

## 2015-05-07 NOTE — Telephone Encounter (Signed)
Initiated Production designer, theatre/television/film (PA) for Ambien Patient tried and failed Tonia Brooms ICD 10 G47.00 [id number] [phone] - fax [phone] - phone  Record faxed for Appeal. Awaiting results.

## 2015-05-08 ENCOUNTER — Telehealth: Payer: Self-pay | Admitting: Internal Medicine

## 2015-05-08 NOTE — Telephone Encounter (Signed)
Lanelle Bal I did not know the answer to this question. Please help with this.

## 2015-05-10 NOTE — Telephone Encounter (Signed)
Returned pts phone call.  I explained to her that we had spoken with our [facility] and we will be able to have 100% cotton sheets for her on the day of her procedure.  Also that they do not have non-polyester gowns available so she will be given a paper gown to wear.  Patient was very appreciative of this arrangement.

## 2015-05-14 NOTE — Telephone Encounter (Signed)
Katie, have you received a response on this yet? Please advise.

## 2015-05-15 NOTE — Telephone Encounter (Signed)
No response as of today. When was information sent and was an estimated time frame given for approval/denial of appeal?

## 2015-05-15 NOTE — Telephone Encounter (Signed)
Intiated on 05-07-15 by Addison Lank, no estimated time frame given for appeal

## 2015-05-15 NOTE — Telephone Encounter (Signed)
Thanks, usually appeals can take up to 2-4 weeks for the approval or denial. Will hold in my in-basket to continue to check on decision from Insurance.

## 2015-05-30 NOTE — Telephone Encounter (Signed)
Katie, have you received anything on pt? thanks

## 2015-06-11 ENCOUNTER — Ambulatory Visit (AMBULATORY_SURGERY_CENTER): Payer: Self-pay | Admitting: *Deleted

## 2015-06-11 VITALS — Ht 66.0 in | Wt 207.0 lb

## 2015-06-11 DIAGNOSIS — K2270 Barrett's esophagus without dysplasia: Secondary | ICD-10-CM

## 2015-06-11 DIAGNOSIS — Z86010 Personal history of colonic polyps: Secondary | ICD-10-CM

## 2015-06-11 MED ORDER — MOVIPREP 100 G PO SOLR: 1.0000 | Freq: Once | ORAL | Status: DC

## 2015-06-11 NOTE — Progress Notes (Signed)
No egg or soy allergy No issues with past sedation-pt does have a post op hx of N/V  No diet pills No home 02 use  Pt states she is a very hard IV stick  Pt states she is allergic to gatorade and she cannot tolerate sodium sulfate and magnesium sulfate together in suprep .  movi prep only has sodium sulfate and she thinks she may be able to tolerate that.   Ordered movi. Checking with suprep rep to see if it contains sodium and mag sulfate both . If not pt wants suprep because of the decreased volume. Will notify patient once clarified.

## 2015-06-12 ENCOUNTER — Encounter: Payer: Self-pay | Admitting: Internal Medicine

## 2015-06-13 NOTE — Telephone Encounter (Signed)
Called number listed below to check status of appeal, states that no appeal was ever initiated.  Form is being faxed to our office at [facility] attention.  Will keep in Katie's box to continue following up on.

## 2015-06-14 NOTE — Telephone Encounter (Signed)
Received fax that Ambien has been denied. Will fax you the denial. Thanks.

## 2015-06-20 ENCOUNTER — Encounter: Payer: Self-pay | Admitting: Internal Medicine

## 2015-06-20 ENCOUNTER — Ambulatory Visit (AMBULATORY_SURGERY_CENTER): Payer: Medicare Other | Admitting: Internal Medicine

## 2015-06-20 VITALS — BP 153/92 | HR 60 | Temp 97.9°F | Resp 18 | Ht 66.0 in | Wt 207.0 lb

## 2015-06-20 DIAGNOSIS — K2270 Barrett's esophagus without dysplasia: Secondary | ICD-10-CM | POA: Diagnosis not present

## 2015-06-20 DIAGNOSIS — Z860100 Personal history of colon polyps, unspecified: Secondary | ICD-10-CM

## 2015-06-20 DIAGNOSIS — K629 Disease of anus and rectum, unspecified: Secondary | ICD-10-CM

## 2015-06-20 DIAGNOSIS — D122 Benign neoplasm of ascending colon: Secondary | ICD-10-CM

## 2015-06-20 DIAGNOSIS — K620 Anal polyp: Secondary | ICD-10-CM

## 2015-06-20 DIAGNOSIS — D129 Benign neoplasm of anus and anal canal: Secondary | ICD-10-CM

## 2015-06-20 DIAGNOSIS — Z8719 Personal history of other diseases of the digestive system: Secondary | ICD-10-CM

## 2015-06-20 DIAGNOSIS — K6289 Other specified diseases of anus and rectum: Secondary | ICD-10-CM | POA: Diagnosis not present

## 2015-06-20 MED ORDER — SODIUM CHLORIDE 0.9 % IV SOLN: 500.0000 mL | INTRAVENOUS | Status: DC

## 2015-06-20 NOTE — Op Note (Signed)
[facility]  [address]. [place], [address]   ENDOSCOPY PROCEDURE REPORT  PATIENT: Donna, Bailey  MR#: [id number] BIRTHDATE: 1945-09-27 , 42  yrs. old GENDER: female ENDOSCOPIST: Eustace Quail, MD REFERRED BY:  Surveillance Program Recall PROCEDURE DATE:  06/20/2015 PROCEDURE:  EGD w/ biopsy ASA CLASS:     Class II INDICATIONS:  history of Barrett's esophagus. MEDICATIONS: Monitored anesthesia care and Propofol 150 mg IV TOPICAL ANESTHETIC: none  DESCRIPTION OF PROCEDURE: After the risks benefits and alternatives of the procedure were thoroughly explained, informed consent was obtained.  The LB [id number] endoscope was introduced through the mouth and advanced to the second portion of the duodenum , Without limitations.  The instrument was slowly withdrawn as the mucosa was fully examined.  The esophagus revealed a 1.5 cm segment of Barrett's.  Inflammation of the squamous mucosa above noted.  Biopsies of the Barrett's taken.  Stomach revealed a moderate hiatal hernia and multiple 9 fundic gland type polyps.  Duodenum was normal.  Retroflexed views revealed a hiatal hernia.     The scope was then withdrawn from the patient and the procedure completed.  COMPLICATIONS: There were no immediate complications.  ENDOSCOPIC IMPRESSION: The esophagus revealed a 1.5 cm segment of Barrett's.  Inflammation of the squamous mucosa above noted.  Biopsies of the Barrett's taken.  Stomach revealed a moderate hiatal hernia and multiple 9 fundic gland type polyps.  Duodenum was normal  RECOMMENDATIONS: 1.  Continue PPI 2.  Await biopsy results 3.  REPEAT SURVEILLANCE EGD IN 5 YEARS  , if no dysplasia  REPEAT EXAM:  eSigned:  Eustace Quail, MD 06/20/2015 2:31 PM    CC:The Patient and Lona Kettle MD

## 2015-06-20 NOTE — Progress Notes (Signed)
Patient awakening,vss,report to rn 

## 2015-06-20 NOTE — Op Note (Signed)
[place]  [address]. [place], [address]   COLONOSCOPY PROCEDURE REPORT  [name], Donna Bailey  MR#: [id number] BIRTHDATE: [birth year] , 57  yrs. old GENDER: female ENDOSCOPIST: Eustace Quail, MD REFERRED [id number] Program Recall PROCEDURE DATE:  06/20/2015 PROCEDURE:   Colonoscopy, surveillance , Colonoscopy with snare polypectomy x 1, and Colonoscopy with biopsy First Screening Colonoscopy - Avg.  risk and is 50 yrs.  old or older - No.  Prior Negative Screening - Now for repeat screening. N/A  History of Adenoma - Now for follow-up colonoscopy & has been > or = to 3 yrs.  Yes hx of adenoma.  Has been 3 or more years since last colonoscopy.  Polyps removed today? Yes ASA CLASS:   Class II INDICATIONS:Surveillance due to prior colonic neoplasia and PH Colon Adenoma. . Multiple prior colonoscopies. Last examination 2011 with small tubular adenoma MEDICATIONS: Monitored anesthesia care and Propofol 350 mg IV  DESCRIPTION OF PROCEDURE:   After the risks benefits and alternatives of the procedure were thoroughly explained, informed consent was obtained.  The digital rectal exam revealed no abnormalities of the rectum.   The LB [id number]  endoscope was introduced through the anus and advanced to the cecum, which was identified by both the appendix and ileocecal valve. No adverse events experienced.   The quality of the prep was excellent. (Suprep was used)  The instrument was then slowly withdrawn as the colon was fully examined. Estimated blood loss is zero unless otherwise noted in this procedure report.  COLON FINDINGS: A single polyp measuring 3 mm in size was found in the ascending colon.  A polypectomy was performed with a cold snare.  The resection was complete, the polyp tissue was completely retrieved and sent to histology. There was a 1 cm soft fleshy lesion in the anal canal. Biopsies taken to evaluate.   The examination was  otherwise normal.  Retroflexed views revealed no abnormalities. The time to cecum = 2.7 Withdrawal time = 14.9   The scope was withdrawn and the procedure completed. COMPLICATIONS: There were no immediate complications.  ENDOSCOPIC IMPRESSION: 1.   Single polyp was found in the ascending colon; polypectomy was performed with a cold snare 2.   Small anal lesion status post biopsies. Rule out condyloma 3.   The examination was otherwise normal  RECOMMENDATIONS: 1.  Follow up colonoscopy in 5 years 2.  Await biopsy results of anal lesion 3.  Upper endoscopy today (please see report)  eSigned:  Eustace Quail, MD 06/20/2015 2:26 PM   cc: The Patient and Lona Kettle MD

## 2015-06-20 NOTE — Patient Instructions (Signed)
YOU HAD AN ENDOSCOPIC PROCEDURE TODAY AT THE [facility]:   Refer to the procedure report that was given to you for any specific questions about what was found during the examination.  If the procedure report does not answer your questions, please call your gastroenterologist to clarify.  If you requested that your care partner not be given the details of your procedure findings, then the procedure report has been included in a sealed envelope for you to review at your convenience later.  YOU SHOULD EXPECT: Some feelings of bloating in the abdomen. Passage of more gas than usual.  Walking can help get rid of the air that was put into your GI tract during the procedure and reduce the bloating. If you had a lower endoscopy (such as a colonoscopy or flexible sigmoidoscopy) you may notice spotting of blood in your stool or on the toilet paper. If you underwent a bowel prep for your procedure, you may not have a normal bowel movement for a few days.  Please Note:  You might notice some irritation and congestion in your nose or some drainage.  This is from the oxygen used during your procedure.  There is no need for concern and it should clear up in a day or so.  SYMPTOMS TO REPORT IMMEDIATELY:   Following lower endoscopy (colonoscopy or flexible sigmoidoscopy):  Excessive amounts of blood in the stool  Significant tenderness or worsening of abdominal pains  Swelling of the abdomen that is new, acute  Fever of 100F or higher   Following upper endoscopy (EGD)  Vomiting of blood or coffee ground material  New chest pain or pain under the shoulder blades  Painful or persistently difficult swallowing  New shortness of breath  Fever of 100F or higher  Black, tarry-looking stools  For urgent or emergent issues, a gastroenterologist can be reached at any hour by calling [phone].   DIET: Your first meal following the procedure should be a small meal and then it is ok to progress to  your normal diet. Heavy or fried foods are harder to digest and may make you feel nauseous or bloated.  Likewise, meals heavy in dairy and vegetables can increase bloating.  Drink plenty of fluids but you should avoid alcoholic beverages for 24 hours.  ACTIVITY:  You should plan to take it easy for the rest of today and you should NOT DRIVE or use heavy machinery until tomorrow (because of the sedation medicines used during the test).    FOLLOW UP: Our staff will call the number listed on your records the next business day following your procedure to check on you and address any questions or concerns that you may have regarding the information given to you following your procedure. If we do not reach you, we will leave a message.  However, if you are feeling well and you are not experiencing any problems, there is no need to return our call.  We will assume that you have returned to your regular daily activities without incident.  If any biopsies were taken you will be contacted by phone or by letter within the next 1-3 weeks.  Please call us at [phone] if you have not heard about the biopsies in 3 weeks.    SIGNATURES/CONFIDENTIALITY: You and/or your care partner have signed paperwork which will be entered into your electronic medical record.  These signatures attest to the fact that that the information above on your After Visit Summary has been reviewed   and is understood.  Full responsibility of the confidentiality of this discharge information lies with you and/or your care-partner. 

## 2015-06-20 NOTE — Progress Notes (Signed)
Patient requesting nurse to cleanse buttocks and rectal area with baby wipes x 2. Skin cleansed with alcohal wipes following removal of electrodes and patient request.

## 2015-06-20 NOTE — Progress Notes (Signed)
Called to room to assist during endoscopic procedure.  Patient ID and intended procedure confirmed with present staff. Received instructions for my participation in the procedure from the performing physician.  

## 2015-06-20 NOTE — Progress Notes (Signed)
On leaving at discharge, patient stating she is not itching at all. 'you all have done a great job, I am very comfortable."

## 2015-06-21 ENCOUNTER — Telehealth: Payer: Self-pay | Admitting: *Deleted

## 2015-06-21 NOTE — Telephone Encounter (Signed)
  Follow up Call-  Call back number 06/20/2015  Post procedure Call Back phone  # [phone]  Permission to leave phone message Yes   [facility]

## 2015-06-23 ENCOUNTER — Emergency Department (HOSPITAL_COMMUNITY): Payer: Medicare Other

## 2015-06-23 ENCOUNTER — Emergency Department (HOSPITAL_COMMUNITY)
Admission: EM | Admit: 2015-06-23 | Discharge: 2015-06-23 | Disposition: A | Discharge status: Home / Self Care | Payer: Medicare Other | Attending: Emergency Medicine | Admitting: Emergency Medicine

## 2015-06-23 DIAGNOSIS — Z8739 Personal history of other diseases of the musculoskeletal system and connective tissue: Secondary | ICD-10-CM | POA: Insufficient documentation

## 2015-06-23 DIAGNOSIS — Z872 Personal history of diseases of the skin and subcutaneous tissue: Secondary | ICD-10-CM | POA: Insufficient documentation

## 2015-06-23 DIAGNOSIS — Y999 Unspecified external cause status: Secondary | ICD-10-CM | POA: Diagnosis not present

## 2015-06-23 DIAGNOSIS — X509XXA Other and unspecified overexertion or strenuous movements or postures, initial encounter: Secondary | ICD-10-CM | POA: Diagnosis not present

## 2015-06-23 DIAGNOSIS — M545 Low back pain: Secondary | ICD-10-CM

## 2015-06-23 DIAGNOSIS — J45909 Unspecified asthma, uncomplicated: Secondary | ICD-10-CM | POA: Insufficient documentation

## 2015-06-23 DIAGNOSIS — Z8639 Personal history of other endocrine, nutritional and metabolic disease: Secondary | ICD-10-CM | POA: Diagnosis not present

## 2015-06-23 DIAGNOSIS — Z86010 Personal history of colonic polyps: Secondary | ICD-10-CM | POA: Diagnosis not present

## 2015-06-23 DIAGNOSIS — S3992XA Unspecified injury of lower back, initial encounter: Secondary | ICD-10-CM | POA: Insufficient documentation

## 2015-06-23 DIAGNOSIS — K219 Gastro-esophageal reflux disease without esophagitis: Secondary | ICD-10-CM | POA: Insufficient documentation

## 2015-06-23 DIAGNOSIS — Z79899 Other long term (current) drug therapy: Secondary | ICD-10-CM | POA: Diagnosis not present

## 2015-06-23 DIAGNOSIS — D649 Anemia, unspecified: Secondary | ICD-10-CM | POA: Insufficient documentation

## 2015-06-23 DIAGNOSIS — K2270 Barrett's esophagus without dysplasia: Secondary | ICD-10-CM | POA: Diagnosis not present

## 2015-06-23 DIAGNOSIS — Y939 Activity, unspecified: Secondary | ICD-10-CM | POA: Diagnosis not present

## 2015-06-23 DIAGNOSIS — F419 Anxiety disorder, unspecified: Secondary | ICD-10-CM | POA: Diagnosis not present

## 2015-06-23 DIAGNOSIS — Z8742 Personal history of other diseases of the female genital tract: Secondary | ICD-10-CM | POA: Diagnosis not present

## 2015-06-23 DIAGNOSIS — Z880 Allergy status to penicillin: Secondary | ICD-10-CM | POA: Diagnosis not present

## 2015-06-23 DIAGNOSIS — Y929 Unspecified place or not applicable: Secondary | ICD-10-CM | POA: Insufficient documentation

## 2015-06-23 DIAGNOSIS — Z8701 Personal history of pneumonia (recurrent): Secondary | ICD-10-CM | POA: Diagnosis not present

## 2015-06-23 DIAGNOSIS — G43909 Migraine, unspecified, not intractable, without status migrainosus: Secondary | ICD-10-CM | POA: Diagnosis not present

## 2015-06-23 DIAGNOSIS — R52 Pain, unspecified: Secondary | ICD-10-CM

## 2015-06-23 DIAGNOSIS — Z87442 Personal history of urinary calculi: Secondary | ICD-10-CM | POA: Diagnosis not present

## 2015-06-23 DIAGNOSIS — Z8669 Personal history of other diseases of the nervous system and sense organs: Secondary | ICD-10-CM | POA: Diagnosis not present

## 2015-06-23 MED ORDER — FENTANYL CITRATE (PF) 100 MCG/2ML IJ SOLN: 50.0000 ug | Freq: Once | INTRAMUSCULAR | Status: DC

## 2015-06-23 NOTE — ED Notes (Signed)
Pt hasn't returned from MRI.

## 2015-06-23 NOTE — Discharge Instructions (Signed)
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
## 2015-06-23 NOTE — ED Notes (Signed)
Patient states that she bent over and heard a pop.  States that she has had low back pain since.

## 2015-06-26 ENCOUNTER — Encounter: Payer: Self-pay | Admitting: Internal Medicine

## 2015-07-23 ENCOUNTER — Telehealth: Payer: Self-pay | Admitting: Internal Medicine

## 2015-07-23 MED ORDER — HYDROCOD POLST-CPM POLST ER 10-8 MG/5ML PO SUER: 5.0000 mL | Freq: Two times a day (BID) | ORAL | Status: DC | PRN

## 2015-07-23 MED ORDER — PREDNISONE 20 MG PO TABS: 20.0000 mg | ORAL_TABLET | Freq: Every day | ORAL | Status: DC

## 2015-07-23 NOTE — Telephone Encounter (Signed)
Per CY-lets change the Rx to #115 ml. Rx has been printed and signed by CY-at front for pick up.   Pt is aware that Rx is at front for pick up. Nothing more needed at this time.

## 2015-07-23 NOTE — Telephone Encounter (Signed)
Spoke with the pt and notified of recs per CDY  She asks for tussionex also  Dr [name] gave verbal okay for 200 ml  Pt will pick up today

## 2015-07-23 NOTE — Telephone Encounter (Signed)
Ok to send Rx to [facility]  For prednisone 20 mg, # 5, 1 daily

## 2015-07-23 NOTE — Telephone Encounter (Signed)
Patient has a lot of chest congestion, coughing up green mucus.  Patient was given Zpak on Friday, it is not working.  Coughing so much that she feels like her ribs hurt.  Pt is allergic to corn fillers and says that the Prednisone 20mg  pill at [facility] is the only Prednisone pill she can take that does not have corn fillers in it.   Pharmacy: [facility] [address]  Allergies  Allergen Reactions  . Sulfonamide Derivatives Anaphylaxis  . Penicillins Rash    Underarms (both)  . Allegra [Fexofenadine]     Heart race  . Avelox [Moxifloxacin]     Doesn't remember  . Caffeine     Heart race  . Cephalexin   . Ciprofloxacin Other (See Comments)    unknown  . Corn-Containing Products Itching and Other (See Comments)    Can not walk if have a lot of it.  . Crestor [Rosuvastatin]   . Formoterol Fumarate Other (See Comments)    unknown  . Levofloxacin     REACTION: HEART RACING  . Levofloxacin In D5w Other (See Comments)    Other Reaction: racing heart  . Macrodantin [Nitrofurantoin Macrocrystal] Itching  . Neomycin Sulfate [Neomycin]     Doesn't remember  . Ofloxacin Itching  . Other     Polyester-"pins sticking in her skin" Gold  . Sulfa Antibiotics   . Wheat Other (See Comments)    Pain in joints  . Adhesive [Tape] Rash  . Ibuprofen Rash  . Mold Extract [Trichophyton Mentagrophyte] Other (See Comments)    Bumps on back, stops up sinuses.  . Nickel Rash  . Tylenol [Acetaminophen] Rash    On face   Current Outpatient Prescriptions on File Prior to Visit  Medication Sig Dispense Refill  . Cyanocobalamin (VITAMIN B-12 IJ) Inject as directed every 30 (thirty) days.    . diphenhydrAMINE (BENADRYL) 25 mg capsule Take 25 mg by mouth every 6 (six) hours as needed for allergies.     Marland Kitchen esomeprazole (NEXIUM) 20 MG capsule Take 20 mg by mouth daily at 12 noon.    . Omega-3 Fatty Acids (FISH OIL PO) Take 820 mg by mouth daily.    . ondansetron (ZOFRAN-ODT) 4 MG disintegrating tablet Take 4  mg by mouth 3 (three) times daily as needed for nausea. Sublingual only    . PRESCRIPTION MEDICATION Apply 1 application topically 3 (three) times daily. Gabapentin cream-1-2 x a day now    . ramelteon (ROZEREM) 8 MG tablet Take 1 tablet (8 mg total) by mouth at bedtime. (Patient not taking: Reported on 06/11/2015) 30 tablet 2   No current facility-administered medications on file prior to visit.

## 2015-07-26 ENCOUNTER — Telehealth: Payer: Self-pay | Admitting: Internal Medicine

## 2015-07-26 MED ORDER — AZITHROMYCIN 250 MG PO TABS: ORAL_TABLET | ORAL | Status: DC

## 2015-07-26 NOTE — Telephone Encounter (Signed)
Called pt - LM for pt to return call.  Dr [name] was going to work patient in this afternoon for a quick neb but was unable to get in touch with her.  If still needed tomorrow, can work her in tomorrow morning with CY.

## 2015-07-26 NOTE — Telephone Encounter (Signed)
Patient Returned call   [phone]

## 2015-07-26 NOTE — Telephone Encounter (Signed)
Spoke with pt and pt is worked in Architectural technologist at 9 AM.  Nothing further needed

## 2015-07-26 NOTE — Telephone Encounter (Signed)
Ok to refill Zpak °

## 2015-07-26 NOTE — Telephone Encounter (Signed)
Patient states that she is not doing better.  She said that she would like to get a refill on Zpak.  She said that she still has a lot of sinus drainage down throat, light green in color, coughing a lot, sputum is a thick light colored green.  Chest hurts from coughing so hard to get out mucus.  She said that in the past she has had to do "back to back Zpak" to get well.    [facility]  Allergies  Allergen Reactions  . Sulfonamide Derivatives Anaphylaxis  . Penicillins Rash    Underarms (both)  . Allegra [Fexofenadine]     Heart race  . Avelox [Moxifloxacin]     Doesn't remember  . Caffeine     Heart race  . Cephalexin   . Ciprofloxacin Other (See Comments)    unknown  . Corn-Containing Products Itching and Other (See Comments)    Can not walk if have a lot of it.  . Crestor [Rosuvastatin]   . Formoterol Fumarate Other (See Comments)    unknown  . Levofloxacin     REACTION: HEART RACING  . Levofloxacin In D5w Other (See Comments)    Other Reaction: racing heart  . Macrodantin [Nitrofurantoin Macrocrystal] Itching  . Neomycin Sulfate [Neomycin]     Doesn't remember  . Ofloxacin Itching  . Other     Corn fillers, corn by-products - causes severe itching Polyester-"pins sticking in her skin" Gold  . Sulfa Antibiotics   . Wheat Other (See Comments)    Pain in joints  . Adhesive [Tape] Rash  . Ibuprofen Rash  . Mold Extract [Trichophyton Mentagrophyte] Other (See Comments)    Bumps on back, stops up sinuses.  . Nickel Rash  . Tylenol [Acetaminophen] Rash    On face   Current Outpatient Prescriptions on File Prior to Visit  Medication Sig Dispense Refill  . chlorpheniramine-HYDROcodone (TUSSIONEX PENNKINETIC ER) 10-8 MG/5ML SUER Take 5 mLs by mouth every 12 (twelve) hours as needed for cough. 115 mL 0  . Cyanocobalamin (VITAMIN B-12 IJ) Inject as directed every 30 (thirty) days.    . diphenhydrAMINE (BENADRYL) 25 mg capsule Take 25 mg by mouth every 6  (six) hours as needed for allergies.     Marland Kitchen esomeprazole (NEXIUM) 20 MG capsule Take 20 mg by mouth daily at 12 noon.    . Omega-3 Fatty Acids (FISH OIL PO) Take 820 mg by mouth daily.    . ondansetron (ZOFRAN-ODT) 4 MG disintegrating tablet Take 4 mg by mouth 3 (three) times daily as needed for nausea. Sublingual only    . predniSONE (DELTASONE) 20 MG tablet Take 1 tablet (20 mg total) by mouth daily with breakfast. 5 tablet 0  . PRESCRIPTION MEDICATION Apply 1 application topically 3 (three) times daily. Gabapentin cream-1-2 x a day now    . ramelteon (ROZEREM) 8 MG tablet Take 1 tablet (8 mg total) by mouth at bedtime. (Patient not taking: Reported on 06/11/2015) 30 tablet 2   No current facility-administered medications on file prior to visit.

## 2015-07-26 NOTE — Telephone Encounter (Signed)
Spoke with pt, aware of refill. rx sent to pharmacy. Pt also requesting to come in for a neb tx.  Pt states she is wheezing upon exhale.    CY please advise if this can be worked in.  Thanks.

## 2015-07-26 NOTE — Telephone Encounter (Signed)
Faith for quick visit if able to come

## 2015-07-27 ENCOUNTER — Ambulatory Visit (INDEPENDENT_AMBULATORY_CARE_PROVIDER_SITE_OTHER): Payer: Medicare Other | Admitting: Internal Medicine

## 2015-07-27 ENCOUNTER — Encounter: Payer: Self-pay | Admitting: Internal Medicine

## 2015-07-27 VITALS — BP 118/70 | HR 83 | Temp 97.7°F | Ht 66.0 in | Wt 207.4 lb

## 2015-07-27 DIAGNOSIS — J4521 Mild intermittent asthma with (acute) exacerbation: Secondary | ICD-10-CM | POA: Diagnosis not present

## 2015-07-27 DIAGNOSIS — G4700 Insomnia, unspecified: Secondary | ICD-10-CM

## 2015-07-27 MED ORDER — ZOLPIDEM TARTRATE 10 MG PO TABS: 10.0000 mg | ORAL_TABLET | Freq: Every evening | ORAL | Status: DC | PRN

## 2015-07-27 NOTE — Assessment & Plan Note (Signed)
Acute bronchitis, slow to resolve Plan-Neb Xopenex, hydration
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
## 2015-07-27 NOTE — Assessment & Plan Note (Signed)
Chronic insomnia. We again reviewed her education experience, attention to good sleep habits and sleep environment, need to manage and supervise sleep medication. Plan-zolpidem, needing 10 mg, prescription printed.

## 2015-07-27 NOTE — Patient Instructions (Addendum)
Neb xop for dx acute bronchitis  Ok to keep planned appointment unless needed sooner  Prescription printed based on discussion-zolpidem 10 mg for sleep if needed

## 2015-08-27 ENCOUNTER — Ambulatory Visit: Payer: Medicare Other | Admitting: Internal Medicine

## 2015-09-26 NOTE — Telephone Encounter (Signed)
Script has been held for over 2 months-pt has been seen since rx was printed.  Will shred rx.  Nothing further needed.

## 2015-10-17 ENCOUNTER — Encounter (HOSPITAL_COMMUNITY): Payer: Self-pay | Admitting: *Deleted

## 2015-10-17 ENCOUNTER — Emergency Department (HOSPITAL_COMMUNITY)
Admission: EM | Admit: 2015-10-17 | Discharge: 2015-10-17 | Disposition: A | Discharge status: Home / Self Care | Payer: Medicare Other | Attending: Emergency Medicine | Admitting: Emergency Medicine

## 2015-10-17 ENCOUNTER — Emergency Department (HOSPITAL_COMMUNITY): Payer: Medicare Other

## 2015-10-17 DIAGNOSIS — R079 Chest pain, unspecified: Secondary | ICD-10-CM | POA: Diagnosis not present

## 2015-10-17 DIAGNOSIS — M419 Scoliosis, unspecified: Secondary | ICD-10-CM | POA: Diagnosis not present

## 2015-10-17 DIAGNOSIS — Z8679 Personal history of other diseases of the circulatory system: Secondary | ICD-10-CM | POA: Insufficient documentation

## 2015-10-17 DIAGNOSIS — G4489 Other headache syndrome: Secondary | ICD-10-CM | POA: Insufficient documentation

## 2015-10-17 DIAGNOSIS — Z86010 Personal history of colonic polyps: Secondary | ICD-10-CM | POA: Diagnosis not present

## 2015-10-17 DIAGNOSIS — Z8659 Personal history of other mental and behavioral disorders: Secondary | ICD-10-CM | POA: Insufficient documentation

## 2015-10-17 DIAGNOSIS — Z79899 Other long term (current) drug therapy: Secondary | ICD-10-CM | POA: Diagnosis not present

## 2015-10-17 DIAGNOSIS — K219 Gastro-esophageal reflux disease without esophagitis: Secondary | ICD-10-CM | POA: Diagnosis not present

## 2015-10-17 DIAGNOSIS — J45901 Unspecified asthma with (acute) exacerbation: Secondary | ICD-10-CM | POA: Insufficient documentation

## 2015-10-17 DIAGNOSIS — Z87448 Personal history of other diseases of urinary system: Secondary | ICD-10-CM | POA: Insufficient documentation

## 2015-10-17 DIAGNOSIS — Z880 Allergy status to penicillin: Secondary | ICD-10-CM | POA: Diagnosis not present

## 2015-10-17 DIAGNOSIS — E7800 Pure hypercholesterolemia, unspecified: Secondary | ICD-10-CM | POA: Insufficient documentation

## 2015-10-17 DIAGNOSIS — Z872 Personal history of diseases of the skin and subcutaneous tissue: Secondary | ICD-10-CM | POA: Diagnosis not present

## 2015-10-17 DIAGNOSIS — G4700 Insomnia, unspecified: Secondary | ICD-10-CM | POA: Diagnosis not present

## 2015-10-17 DIAGNOSIS — Z862 Personal history of diseases of the blood and blood-forming organs and certain disorders involving the immune mechanism: Secondary | ICD-10-CM | POA: Insufficient documentation

## 2015-10-17 DIAGNOSIS — Z87442 Personal history of urinary calculi: Secondary | ICD-10-CM | POA: Insufficient documentation

## 2015-10-17 DIAGNOSIS — Z8701 Personal history of pneumonia (recurrent): Secondary | ICD-10-CM | POA: Insufficient documentation

## 2015-10-17 LAB — I-STAT TROPONIN, ED
TROPONIN I, POC: 0 ng/mL (ref 0.00–0.08)
Troponin i, poc: 0 ng/mL (ref 0.00–0.08)

## 2015-10-17 LAB — CBC
HCT: 44.6 % (ref 36.0–46.0)
Hemoglobin: 14.6 g/dL (ref 12.0–15.0)
MCH: 30.8 pg (ref 26.0–34.0)
MCHC: 32.7 g/dL (ref 30.0–36.0)
MCV: 94.1 fL (ref 78.0–100.0)
Platelets: 261 10*3/uL (ref 150–400)
RBC: 4.74 MIL/uL (ref 3.87–5.11)
RDW: 13.8 % (ref 11.5–15.5)
WBC: 5.9 10*3/uL (ref 4.0–10.5)

## 2015-10-17 LAB — BASIC METABOLIC PANEL
Anion gap: 12 (ref 5–15)
BUN: 10 mg/dL (ref 6–20)
CALCIUM: 9.5 mg/dL (ref 8.9–10.3)
CO2: 25 mmol/L (ref 22–32)
CREATININE: 0.86 mg/dL (ref 0.44–1.00)
Chloride: 105 mmol/L (ref 101–111)
GFR calc non Af Amer: >60 mL/min (ref >60)
Glucose, Bld: 102 mg/dL — ABNORMAL HIGH (ref 65–99)
Potassium: 4.4 mmol/L (ref 3.5–5.1)
SODIUM: 142 mmol/L (ref 135–145)

## 2015-10-17 MED ORDER — NITROGLYCERIN 0.4 MG SL SUBL
0.4000 mg | SUBLINGUAL_TABLET | SUBLINGUAL | Status: DC | PRN
  Administered 2015-10-17: 0.4 mg via SUBLINGUAL

## 2015-10-17 NOTE — Discharge Instructions (Signed)
Your caregiver has diagnosed you as having chest pain that is not specific for one problem, but does not require admission.  Chest pain comes from many different causes.  °SEEK IMMEDIATE MEDICAL ATTENTION IF: °You have severe chest pain, especially if the pain is crushing or pressure-like and spreads to the arms, back, neck, or jaw, or if you have sweating, nausea (feeling sick to your stomach), or shortness of breath. THIS IS AN EMERGENCY. Don't wait to see if the pain will go away. Get medical help at once. Call 911 or 0 (operator). DO NOT drive yourself to the hospital.  °Your chest pain gets worse and does not go away with rest.  °You have an attack of chest pain lasting longer than usual, despite rest and treatment with the medications your caregiver has prescribed.  °You wake from sleep with chest pain or shortness of breath.  °You feel dizzy or faint.  °You have chest pain not typical of your usual pain for which you originally saw your caregiver. ° °You are having a headache. No specific cause was found today for your headache. It may have been a migraine or other cause of headache. Stress, anxiety, fatigue, and depression are common triggers for headaches. Your headache today does not appear to be life-threatening or require hospitalization, but often the exact cause of headaches is not determined in the emergency department. Therefore, follow-up with your doctor is very important to find out what may have caused your headache, and whether or not you need any further diagnostic testing or treatment. Sometimes headaches can appear benign (not harmful), but then more serious symptoms can develop which should prompt an immediate re-evaluation by your doctor or the emergency department. ° °SEEK MEDICAL ATTENTION IF: ° °You develop possible problems with medications prescribed.  °The medications don't resolve your headache, if it recurs , or if you have multiple episodes of vomiting or can't take fluids. °You  have a change from the usual headache. ° °RETURN IMMEDIATELY IF you develop a sudden, severe headache or confusion, become poorly responsive or faint, develop a fever above 100.4F or problem breathing, have a change in speech, vision, swallowing, or understanding, or develop new weakness, numbness, tingling, incoordination, or have a seizure. ° °

## 2015-10-17 NOTE — ED Notes (Addendum)
Pt reports left side chest pressure for over a week, has a discomfort that radiates into left side of neck and jaw. Reports chonic sob and fatigue. ekg done at triage. No acute distress noted.

## 2015-10-17 NOTE — ED Notes (Signed)
Called laundry patient  needs 100% cotton material for sheets and gowns. Do not have 100% cotton.  Patient did bring own 100% cotton sheet.
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
## 2015-10-31 ENCOUNTER — Encounter: Payer: Self-pay | Admitting: Cardiology

## 2015-10-31 DIAGNOSIS — I671 Cerebral aneurysm, nonruptured: Secondary | ICD-10-CM | POA: Insufficient documentation

## 2015-10-31 DIAGNOSIS — E669 Obesity, unspecified: Secondary | ICD-10-CM | POA: Insufficient documentation

## 2015-11-26 DIAGNOSIS — N8110 Cystocele, unspecified: Secondary | ICD-10-CM | POA: Insufficient documentation

## 2016-02-09 ENCOUNTER — Other Ambulatory Visit: Payer: Self-pay | Admitting: Physician Assistant

## 2016-02-09 DIAGNOSIS — N644 Mastodynia: Secondary | ICD-10-CM

## 2016-02-14 ENCOUNTER — Ambulatory Visit
Admission: RE | Admit: 2016-02-14 | Discharge: 2016-02-14 | Disposition: A | Discharge status: Home / Self Care | Payer: Medicare Other | Source: Ambulatory Visit | Attending: Physician Assistant | Admitting: Physician Assistant

## 2016-02-14 DIAGNOSIS — N644 Mastodynia: Secondary | ICD-10-CM

## 2016-03-04 ENCOUNTER — Telehealth: Payer: Self-pay | Admitting: Internal Medicine

## 2016-03-04 MED ORDER — ZOLPIDEM TARTRATE 10 MG PO TABS: 10.0000 mg | ORAL_TABLET | Freq: Every evening | ORAL | 5 refills | Status: DC | PRN

## 2016-03-04 NOTE — Telephone Encounter (Signed)
Spoke with the pt  She is requesting refill on zolpidem 10 mg  Last ov 07/26/16 and I scheduled f/u for 05-05-16  Last rx 07/26/16 # 30 with 5 rf  Please advise thanks!

## 2016-03-04 NOTE — Telephone Encounter (Signed)
Ok refill 6 months 

## 2016-03-04 NOTE — Telephone Encounter (Signed)
Called and lmom to make the pt aware that the rx for the zolpidem has been called to the pharmacy.  Called and gave this medication refill to the pharmacy and they are going to get this filled for the pt . Nothing further is needed.

## 2016-04-15 ENCOUNTER — Ambulatory Visit (INDEPENDENT_AMBULATORY_CARE_PROVIDER_SITE_OTHER): Payer: Medicare Other

## 2016-04-15 DIAGNOSIS — Z23 Encounter for immunization: Secondary | ICD-10-CM

## 2016-04-16 DIAGNOSIS — Z23 Encounter for immunization: Secondary | ICD-10-CM

## 2016-05-05 ENCOUNTER — Ambulatory Visit: Payer: Medicare Other | Admitting: Internal Medicine

## 2016-06-03 ENCOUNTER — Other Ambulatory Visit: Payer: Self-pay | Admitting: Family Medicine

## 2016-06-03 DIAGNOSIS — R1011 Right upper quadrant pain: Secondary | ICD-10-CM

## 2016-06-12 ENCOUNTER — Ambulatory Visit
Admission: RE | Admit: 2016-06-12 | Discharge: 2016-06-12 | Disposition: A | Discharge status: Home / Self Care | Payer: Medicare Other | Source: Ambulatory Visit | Attending: Family Medicine | Admitting: Family Medicine

## 2016-06-12 DIAGNOSIS — R1011 Right upper quadrant pain: Secondary | ICD-10-CM

## 2016-07-22 ENCOUNTER — Telehealth (HOSPITAL_COMMUNITY): Payer: Self-pay

## 2016-07-22 NOTE — Telephone Encounter (Signed)
Called to schedule consult, left message for pt to return call. AW 

## 2016-07-31 ENCOUNTER — Telehealth (HOSPITAL_COMMUNITY): Payer: Self-pay

## 2016-07-31 NOTE — Telephone Encounter (Signed)
Called to schedule consult, left message. AW

## 2016-08-11 ENCOUNTER — Other Ambulatory Visit (HOSPITAL_COMMUNITY): Payer: Self-pay | Admitting: Interventional Radiology

## 2016-08-11 DIAGNOSIS — I729 Aneurysm of unspecified site: Secondary | ICD-10-CM

## 2016-08-13 ENCOUNTER — Ambulatory Visit (HOSPITAL_COMMUNITY)
Admission: RE | Admit: 2016-08-13 | Discharge: 2016-08-13 | Disposition: A | Discharge status: Home / Self Care | Payer: Medicare Other | Source: Ambulatory Visit | Attending: Interventional Radiology | Admitting: Interventional Radiology

## 2016-08-13 ENCOUNTER — Encounter (HOSPITAL_COMMUNITY): Payer: Self-pay | Admitting: Radiology

## 2016-08-13 DIAGNOSIS — I729 Aneurysm of unspecified site: Secondary | ICD-10-CM

## 2016-08-13 HISTORY — PX: IR GENERIC HISTORICAL: IMG1180011

## 2016-08-18 ENCOUNTER — Encounter (HOSPITAL_COMMUNITY): Payer: Self-pay | Admitting: Interventional Radiology

## 2016-08-18 ENCOUNTER — Other Ambulatory Visit (HOSPITAL_COMMUNITY): Payer: Self-pay | Admitting: Interventional Radiology

## 2016-08-18 DIAGNOSIS — I729 Aneurysm of unspecified site: Secondary | ICD-10-CM

## 2016-08-19 ENCOUNTER — Ambulatory Visit (HOSPITAL_COMMUNITY): Payer: Medicare Other

## 2016-08-26 ENCOUNTER — Ambulatory Visit (HOSPITAL_COMMUNITY)
Admission: RE | Admit: 2016-08-26 | Discharge: 2016-08-26 | Disposition: A | Discharge status: Home / Self Care | Payer: Medicare Other | Source: Ambulatory Visit | Attending: Interventional Radiology | Admitting: Interventional Radiology

## 2016-08-26 ENCOUNTER — Ambulatory Visit (HOSPITAL_COMMUNITY): Payer: Medicare Other

## 2016-08-26 DIAGNOSIS — I720 Aneurysm of carotid artery: Secondary | ICD-10-CM | POA: Insufficient documentation

## 2016-08-26 DIAGNOSIS — I729 Aneurysm of unspecified site: Secondary | ICD-10-CM

## 2016-08-26 DIAGNOSIS — R9089 Other abnormal findings on diagnostic imaging of central nervous system: Secondary | ICD-10-CM | POA: Diagnosis not present

## 2016-08-26 LAB — CREATININE, SERUM
Creatinine, Ser: 0.96 mg/dL (ref 0.44–1.00)
GFR calc Af Amer: >60 mL/min (ref >60)
GFR, EST NON AFRICAN AMERICAN: 59 mL/min — AB (ref >60)

## 2016-08-26 MED ORDER — GADOBENATE DIMEGLUMINE 529 MG/ML IV SOLN
15.0000 mL | Freq: Once | INTRAVENOUS | Status: AC | PRN
  Administered 2016-08-26: 20 mL via INTRAVENOUS

## 2016-09-04 ENCOUNTER — Other Ambulatory Visit (HOSPITAL_COMMUNITY): Payer: Self-pay | Admitting: Interventional Radiology

## 2016-09-04 ENCOUNTER — Telehealth (HOSPITAL_COMMUNITY): Payer: Self-pay

## 2016-09-04 DIAGNOSIS — I729 Aneurysm of unspecified site: Secondary | ICD-10-CM

## 2016-09-04 NOTE — Telephone Encounter (Signed)
Left message for pt to return call. AW 

## 2016-09-12 ENCOUNTER — Encounter (HOSPITAL_COMMUNITY): Payer: Self-pay | Admitting: Radiology

## 2016-09-12 ENCOUNTER — Ambulatory Visit (HOSPITAL_COMMUNITY)
Admission: RE | Admit: 2016-09-12 | Discharge: 2016-09-12 | Disposition: A | Discharge status: Home / Self Care | Payer: Medicare Other | Source: Ambulatory Visit | Attending: Interventional Radiology | Admitting: Interventional Radiology

## 2016-09-12 DIAGNOSIS — I729 Aneurysm of unspecified site: Secondary | ICD-10-CM

## 2016-09-12 HISTORY — PX: IR GENERIC HISTORICAL: IMG1180011

## 2016-09-23 ENCOUNTER — Encounter (HOSPITAL_COMMUNITY): Payer: Self-pay | Admitting: Interventional Radiology

## 2016-09-24 ENCOUNTER — Telehealth: Payer: Self-pay | Admitting: Radiology

## 2016-09-24 NOTE — Progress Notes (Signed)
Incorrectly opened

## 2016-09-24 NOTE — Progress Notes (Signed)
   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  He will call me if any issues.

## 2016-09-29 ENCOUNTER — Other Ambulatory Visit: Payer: Self-pay | Admitting: Radiology

## 2016-09-29 ENCOUNTER — Other Ambulatory Visit (HOSPITAL_COMMUNITY): Payer: Self-pay | Admitting: Interventional Radiology

## 2016-09-29 DIAGNOSIS — I671 Cerebral aneurysm, nonruptured: Secondary | ICD-10-CM

## 2016-09-30 ENCOUNTER — Ambulatory Visit (HOSPITAL_COMMUNITY)
Admission: RE | Admit: 2016-09-30 | Discharge: 2016-09-30 | Disposition: A | Discharge status: Home / Self Care | Payer: Medicare Other | Source: Ambulatory Visit | Attending: Interventional Radiology | Admitting: Interventional Radiology

## 2016-09-30 ENCOUNTER — Encounter (HOSPITAL_COMMUNITY): Payer: Self-pay | Admitting: Radiology

## 2016-09-30 DIAGNOSIS — I671 Cerebral aneurysm, nonruptured: Secondary | ICD-10-CM

## 2016-09-30 HISTORY — PX: IR GENERIC HISTORICAL: IMG1180011

## 2016-10-27 ENCOUNTER — Telehealth (HOSPITAL_COMMUNITY): Payer: Self-pay | Admitting: Radiology

## 2016-10-27 NOTE — Telephone Encounter (Signed)
Called pt, told her that I would be mailing her the paperwork for her to take with her to [facility] for the Clopidogrel genotype testing.  Pt states understanding and agrees with this plan of care. She will let me know when she goes for this test. JM

## 2016-10-28 ENCOUNTER — Other Ambulatory Visit: Payer: Self-pay | Admitting: Orthopaedic Surgery

## 2016-10-28 DIAGNOSIS — G8929 Other chronic pain: Secondary | ICD-10-CM

## 2016-10-28 DIAGNOSIS — M25511 Pain in right shoulder: Principal | ICD-10-CM

## 2016-11-05 ENCOUNTER — Ambulatory Visit
Admission: RE | Admit: 2016-11-05 | Discharge: 2016-11-05 | Disposition: A | Discharge status: Home / Self Care | Payer: Medicare Other | Source: Ambulatory Visit | Attending: Orthopaedic Surgery | Admitting: Orthopaedic Surgery

## 2016-11-05 DIAGNOSIS — G8929 Other chronic pain: Secondary | ICD-10-CM

## 2016-11-05 DIAGNOSIS — M25511 Pain in right shoulder: Principal | ICD-10-CM

## 2016-11-09 DIAGNOSIS — M75111 Incomplete rotator cuff tear or rupture of right shoulder, not specified as traumatic: Secondary | ICD-10-CM | POA: Insufficient documentation

## 2016-11-09 DIAGNOSIS — M67921 Unspecified disorder of synovium and tendon, right upper arm: Secondary | ICD-10-CM | POA: Insufficient documentation

## 2016-11-09 DIAGNOSIS — M7541 Impingement syndrome of right shoulder: Secondary | ICD-10-CM | POA: Insufficient documentation

## 2016-11-10 ENCOUNTER — Ambulatory Visit: Payer: Medicare Other | Admitting: Neurology

## 2016-12-22 DIAGNOSIS — M19171 Post-traumatic osteoarthritis, right ankle and foot: Secondary | ICD-10-CM | POA: Insufficient documentation

## 2017-01-19 ENCOUNTER — Encounter: Payer: Self-pay | Admitting: Neurology

## 2017-01-19 ENCOUNTER — Ambulatory Visit (INDEPENDENT_AMBULATORY_CARE_PROVIDER_SITE_OTHER): Payer: Medicare Other | Admitting: Neurology

## 2017-01-19 VITALS — BP 148/90 | HR 83 | Ht 66.0 in | Wt 199.6 lb

## 2017-01-19 DIAGNOSIS — I729 Aneurysm of unspecified site: Secondary | ICD-10-CM | POA: Diagnosis not present

## 2017-01-19 DIAGNOSIS — R51 Headache: Secondary | ICD-10-CM

## 2017-01-19 DIAGNOSIS — G8929 Other chronic pain: Secondary | ICD-10-CM
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
## 2017-01-19 NOTE — Patient Instructions (Addendum)
1.  We will find out if  Nortriptyline as corn-filler and let you know 2.  Follow the sleep hygiene recommendations 3.  Continue neck treatment 4.  We will check CT/CTA of head 5.  Follow up in 4 months.

## 2017-01-21 ENCOUNTER — Other Ambulatory Visit: Payer: Medicare Other

## 2017-01-21 ENCOUNTER — Ambulatory Visit
Admission: RE | Admit: 2017-01-21 | Discharge: 2017-01-21 | Disposition: A | Discharge status: Home / Self Care | Payer: Medicare Other | Source: Ambulatory Visit | Attending: Neurology | Admitting: Neurology

## 2017-01-21 DIAGNOSIS — I729 Aneurysm of unspecified site: Secondary | ICD-10-CM

## 2017-01-21 DIAGNOSIS — R51 Headache: Principal | ICD-10-CM

## 2017-01-21 DIAGNOSIS — R519 Headache, unspecified: Secondary | ICD-10-CM

## 2017-01-21 MED ORDER — IOPAMIDOL (ISOVUE-370) INJECTION 76%
75.0000 mL | Freq: Once | INTRAVENOUS | Status: AC | PRN
  Administered 2017-01-21: 75 mL via INTRAVENOUS

## 2017-01-22 ENCOUNTER — Telehealth: Payer: Self-pay | Admitting: Neurology

## 2017-01-22 NOTE — Telephone Encounter (Signed)
Patient made aware of results. Copy sent to Dr. Harrington Challenger and mailed to patient per her request.

## 2017-01-22 NOTE — Telephone Encounter (Signed)
-----   Message from Pieter Partridge, DO sent at 01/22/2017  7:06 AM EDT ----- CTA shows nothing new or concerning.  The aneurysm looks stable.

## 2017-01-23 ENCOUNTER — Other Ambulatory Visit: Payer: Self-pay | Admitting: Internal Medicine

## 2017-01-23 NOTE — Telephone Encounter (Signed)
Ok to refill total 6 months  We should see her sometime in the next year for f/u to continue prescribing.

## 2017-01-23 NOTE — Telephone Encounter (Signed)
CY Please advise on refill. Thanks.  

## 2017-01-28 ENCOUNTER — Telehealth: Payer: Self-pay | Admitting: Internal Medicine

## 2017-01-28 MED ORDER — ZOLPIDEM TARTRATE 10 MG PO TABS: 10.0000 mg | ORAL_TABLET | Freq: Every day | ORAL | 5 refills | Status: DC

## 2017-01-28 NOTE — Telephone Encounter (Signed)
Pt returning call and can be reached @ [phone].Donna Bailey

## 2017-01-28 NOTE — Telephone Encounter (Signed)
Patient uses [facility] [address] [facility]

## 2017-01-28 NOTE — Telephone Encounter (Signed)
lmom tcb x1 to pt  Spoke to pt earlier and she stated that she had 5 pills left. Upon looking in the chart pt was last seen in 2016. Clerical staff made her a OV for Oct 2018. CY is back in the office on Monday. ATC pt back to make her aware that CY is out the office until Monday and then he can address this

## 2017-01-28 NOTE — Telephone Encounter (Signed)
Rx called in x 5 refills (controlled med) and called pt to make aware and apologized for the delay in getting back with her about her Rx. See telephone note 01/28/17. Nothing further needed.

## 2017-01-28 NOTE — Telephone Encounter (Signed)
This was addressed on 01/23/17 and was not yet taken care of.   Baird Lyons D, MD  to [facility] Triage Pool  01/23/17 1:54 PM  Note    Ok to refill total 6 months  We should see her sometime in the next year for f/u to continue prescribing.     Rx called in x 5 refills (controlled med) and called pt to make aware and apologized for the delay in getting back with her about her Rx. Nothing further needed.

## 2017-02-02 ENCOUNTER — Telehealth: Payer: Self-pay | Admitting: Internal Medicine

## 2017-02-02 NOTE — Telephone Encounter (Signed)
Spoke with pt. States that she paid out of pocket for Zolpidem. She does not want to have her medication changed. Nothing further was needed.

## 2017-02-02 NOTE — Telephone Encounter (Signed)
Please tell patient insurance will have [place] use one of their covered meds instead of zolpidem Offer Belsomra 15 mg # 30, 1 at bedtime   Give this at least 2 weeks trial  It may help to also take otc Melatonin 4-5 mg each night at bedtime as well

## 2017-02-02 NOTE — Telephone Encounter (Signed)
Called to initiate the PA for the zolpidem 10 mg---the insurance preference for this medication is  belsomara or rozerum.  CY please advise if you want to change the pt to one of these meds?  Thanks  Allergies  Allergen Reactions  . Sulfonamide Derivatives Anaphylaxis  . Penicillins Rash    Underarms (both) Has patient had a PCN reaction causing immediate rash, facial/tongue/throat swelling, SOB or lightheadedness with hypotension: YES Has patient had a PCN reaction causing severe rash involving mucus membranes or skin necrosis: NO Has patient had a PCN reaction that required hospitalization NO Has patient had a PCN reaction occurring within the last 10 years: NO If all of the above answers are "NO", then may proceed with Cephalosporin use.  Delma Freeze [Fexofenadine]     Heart race  . Avelox [Moxifloxacin]     Doesn't remember  . Caffeine     Heart race  . Cephalexin   . Ciprofloxacin Other (See Comments)    unknown  . Corn-Containing Products Itching and Other (See Comments)    Can not walk if have a lot of it.  . Crestor [Rosuvastatin]   . Formoterol Fumarate Other (See Comments)    unknown  . Levofloxacin     REACTION: HEART RACING  . Levofloxacin In D5w Other (See Comments)    Other Reaction: racing heart  . Macrodantin [Nitrofurantoin Macrocrystal] Itching  . Neomycin Sulfate [Neomycin]     Doesn't remember  . Ofloxacin Itching  . Other     Corn fillers, corn by-products - causes severe itching Polyester-"pins sticking in her skin" Gold  . Sulfa Antibiotics   . Wheat Other (See Comments)    Pain in joints  . Adhesive [Tape] Rash  . Ibuprofen Rash  . Mold Extract [Trichophyton Mentagrophyte] Other (See Comments)    Bumps on back, stops up sinuses.  . Nickel Rash  . Tylenol [Acetaminophen] Rash    On face

## 2017-05-05 ENCOUNTER — Ambulatory Visit (INDEPENDENT_AMBULATORY_CARE_PROVIDER_SITE_OTHER)
Admission: RE | Admit: 2017-05-05 | Discharge: 2017-05-05 | Disposition: A | Discharge status: Home / Self Care | Payer: Medicare Other | Source: Ambulatory Visit | Attending: Internal Medicine | Admitting: Internal Medicine

## 2017-05-05 ENCOUNTER — Encounter: Payer: Self-pay | Admitting: Internal Medicine

## 2017-05-05 ENCOUNTER — Ambulatory Visit (INDEPENDENT_AMBULATORY_CARE_PROVIDER_SITE_OTHER): Payer: Medicare Other | Admitting: Internal Medicine

## 2017-05-05 VITALS — BP 120/70 | HR 88 | Ht 66.0 in | Wt 195.0 lb

## 2017-05-05 DIAGNOSIS — R05 Cough: Secondary | ICD-10-CM

## 2017-05-05 DIAGNOSIS — R053 Chronic cough: Secondary | ICD-10-CM

## 2017-05-05 DIAGNOSIS — Z23 Encounter for immunization: Secondary | ICD-10-CM | POA: Diagnosis not present

## 2017-05-05 DIAGNOSIS — F5101 Primary insomnia: Secondary | ICD-10-CM | POA: Diagnosis not present

## 2017-05-05 DIAGNOSIS — J45909 Unspecified asthma, uncomplicated: Secondary | ICD-10-CM

## 2017-05-05 MED ORDER — ZOLPIDEM TARTRATE 10 MG PO TABS: 10.0000 mg | ORAL_TABLET | Freq: Every day | ORAL | 5 refills | Status: DC

## 2017-05-05 MED ORDER — FLUTICASONE FUROATE-VILANTEROL 100-25 MCG/INH IN AEPB: 1.0000 | INHALATION_SPRAY | Freq: Every day | RESPIRATORY_TRACT | 0 refills | Status: DC

## 2017-05-05 NOTE — Patient Instructions (Signed)
Flu vax- Therapist, sports for [employer]- CXR   Dx chronic cough  Suggest otc throat lozenges, sips of liquids for the cough.      Sample Breo 100    Inhale 1 puff, then rinse mouth, once daily  Letter re electric meter

## 2017-05-05 NOTE — Assessment & Plan Note (Signed)
She uses occasional Ambien with little change in pattern long-term. No sleepwalking or associated problems identified. Plan-safety discussion. Refill Ambien

## 2017-05-05 NOTE — Assessment & Plan Note (Signed)
Nontender specific symptoms but we've had a number of people coming to the office reporting dry cough in the last 2-3 weeks, approximately associated with season change and unstable weather. Her unusual/alternative understanding of healthcare issues makes interpretation a little more difficult . She has had a similar raspy cough at previous visit. Plan-flu vaccine, CXR, sample Breo.

## 2017-05-05 NOTE — Progress Notes (Signed)
HPI female never smoker followed for bronchitis, allergic rhinitis, chronic insomnia, "intolerance of all corn products", complicated by GERD and multiple medical problems   -----------------------------------------------------------------------------------------------------------------  04/17/15- 71 year old female never smoker followed for bronchitis, allergic rhinitis, chronic insomnia, intolerance of all corn products complicated by GERD and multiple medical problems.  Follows For: pt states medication Rozerem  is not helping put her to sleep or stay asleep it makes her feel  dizzy. pt states the Belsomra helps her sleep all night but feels like a zombie and cant function when she wakes up. pt states shes been congested for about a month, sneezing and dry cough. Of available antihistamines, she says she can only take Benadryl because all of these have corn in them. Doesn't do well with nasal sprays generally. Insomnia problems continue. Seroquel made her too dizzy. Belsomra 15 mg made her muscles stiff and left her groggy but did help her sleep. Rozerem did not help with sleep but made her dizzy.  07/27/2015-71 year old female never smoker followed for bronchitis, allergic rhinitis, chronic insomnia, intolerance of all corn products, complicated by GERD and multiple medical problems  Acute visit-she returned today reporting discolored sputum gradually clearing but requesting a nebulizer treatment as she continues to cough from acute bronchitis. Insomnia: She again requests prescription for zolpidem 10 mg, insisting that nothing else has worked as well to help her manage chronic insomnia. We discussed various dosing options and alternative products at reviewed her past experience. She continues to work on basic good sleep habits as educated. We discussed need to supervise for safety and effectiveness monitoring but I emphasized her primary responsibility to take the lowest effective dose, to be  open to alternative management options, as available.  05/05/17- 71 year old female never smoker followed for bronchitis, allergic rhinitis, chronic insomnia, intolerance of all corn products, complicated by GERD and multiple medical problems FOLLOWS FOR: Pt continues to have slight deep cough; uses Ambien to help sleep-wants Rx printed to hold for refills.  Pt would also like to discuss some of her allergies to meds,etc. Has noted increased dry cough over the last month. Denies nasal congestion. Ears sometimes feel full intermittently. Denies fever, chills, sore throat, adenopathy. Not wheezing. Takes occasional Benadryl. Uses Ambien more nights than not for chronic insomnia without change. We reviewed sleep hygiene basics again. She denies morning carryover, nocturnal confusion, sleepwalking. She limits foods with magnesium which she says make her feel hot. She tells me she is allergic to nickel, gold and polyester "all of which conduct electricity". Therefore she wants a letter for her to present to [place] asking them to change her electric meter to a "noncommunicating electric meter", so that [employer] wall ascending radio waves through her home. She prefers a manual meter check.  ROS-see HPI  + = positive Constitutional:   No-   weight loss, night sweats, fevers, chills, fatigue, lassitude. HEENT:   No-  headaches, difficulty swallowing, tooth/dental problems, sore throat,       No- sneezing, no-itching, +ear ache, +nasal congestion, post nasal drip,  CV:  No-   chest pain, orthopnea, PND, swelling in lower extremities, anasarca,  dizziness, palpitations Resp: + shortness of breath with exertion or at rest.              No-  productive cough,  + non-productive cough,  No- coughing up of blood.              No-   change in color of mucus.  No- wheezing.   Skin: Per HPI GI:  No-   heartburn, indigestion, abdominal pain, nausea, vomiting,  GU:  MS:  No-   joint pain or swelling.   Neuro-      nothing unusual Psych:  No- change in mood or affect. No depression or anxiety.  No memory loss.  OBJ General- Alert, Oriented, Affect-appropriate, Distress- none acute, + obese Skin- no rash seen Lymphadenopathy- none Head- atraumatic            Eyes- Gross vision intact, PERRLA, conjunctivae clear secretions.            Ears- Hearing grossly normal,             Nose- clear, no-Septal dev, polyps, erosion, perforation             Throat- Mallampati II , mucosa-clear , drainage- none, tonsils- atrophic, + own teeth Neck- flexible , trachea midline, no stridor , thyroid nl, carotid no bruit Chest - symmetrical excursion , unlabored           Heart/CV- RRR , no murmur , no gallop  , no rub, nl s1 s2                           - JVD- none , edema- none, stasis changes- none, varices- none           Lung-  wheeze- none, cough+raspy x 1 , dullness-none, rub- none, +yellow mucus on kleenex           Chest wall-  Abd-  Br/ Gen/ Rectal- Not done, not indicated Extrem- cyanosis- none, clubbing, none, atrophy- none, strength- nl Neuro- grossly intact to observation

## 2017-05-11 ENCOUNTER — Telehealth: Payer: Self-pay | Admitting: Internal Medicine

## 2017-05-11 NOTE — Telephone Encounter (Signed)
Spoke with patient. She seems to think she is having a reaction to the flu shot she received on 05/05/17. She stated that she has been having night sweats and not feeling like her self since last Friday. She did state that when she saw Dr. [name] on the 2nd, she didn't feel right then either. She felt tired that day which is not like her.   Denied any SOB, hives or chest pain.   CY, please advise. Thanks!

## 2017-05-11 NOTE — Telephone Encounter (Signed)
Suggest extra fluids and rest. Tylenol if needed for aches. What she describes might be within expected symptoms after a flu shot. There are other virus infections going through town now as well.

## 2017-05-11 NOTE — Telephone Encounter (Signed)
Spoke with patient with CY's recs. She verbalized understanding. Nothing else needed at time of call.

## 2017-05-21 ENCOUNTER — Ambulatory Visit: Payer: Medicare Other | Admitting: Neurology

## 2017-05-26 ENCOUNTER — Other Ambulatory Visit (INDEPENDENT_AMBULATORY_CARE_PROVIDER_SITE_OTHER): Payer: Medicare Other

## 2017-05-26 ENCOUNTER — Encounter: Payer: Self-pay | Admitting: Internal Medicine

## 2017-05-26 ENCOUNTER — Ambulatory Visit (INDEPENDENT_AMBULATORY_CARE_PROVIDER_SITE_OTHER): Payer: Medicare Other | Admitting: Physician Assistant

## 2017-05-26 ENCOUNTER — Encounter: Payer: Self-pay | Admitting: Physician Assistant

## 2017-05-26 VITALS — BP 140/90 | HR 80 | Ht 66.0 in | Wt 191.2 lb

## 2017-05-26 DIAGNOSIS — Z8719 Personal history of other diseases of the digestive system: Secondary | ICD-10-CM

## 2017-05-26 DIAGNOSIS — Z8711 Personal history of peptic ulcer disease: Secondary | ICD-10-CM

## 2017-05-26 DIAGNOSIS — R110 Nausea: Secondary | ICD-10-CM

## 2017-05-26 DIAGNOSIS — R1013 Epigastric pain: Secondary | ICD-10-CM

## 2017-05-26 DIAGNOSIS — R6881 Early satiety: Secondary | ICD-10-CM

## 2017-05-26 LAB — CBC WITH DIFFERENTIAL/PLATELET
BASOS ABS: 0 10*3/uL (ref 0.0–0.1)
Basophils Relative: 0.2 % (ref 0.0–3.0)
EOS ABS: 0.3 10*3/uL (ref 0.0–0.7)
Eosinophils Relative: 3.3 % (ref 0.0–5.0)
HEMATOCRIT: 47 % — AB (ref 36.0–46.0)
Hemoglobin: 15.4 g/dL — ABNORMAL HIGH (ref 12.0–15.0)
LYMPHS PCT: 35.2 % (ref 12.0–46.0)
Lymphs Abs: 2.7 10*3/uL (ref 0.7–4.0)
MCHC: 32.7 g/dL (ref 30.0–36.0)
MCV: 92.8 fl (ref 78.0–100.0)
MONO ABS: 0.7 10*3/uL (ref 0.1–1.0)
Monocytes Relative: 9.6 % (ref 3.0–12.0)
NEUTROS ABS: 4 10*3/uL (ref 1.4–7.7)
Neutrophils Relative %: 51.7 % (ref 43.0–77.0)
PLATELETS: 296 10*3/uL (ref 150.0–400.0)
RBC: 5.06 Mil/uL (ref 3.87–5.11)
RDW: 15 % (ref 11.5–15.5)
WBC: 7.7 10*3/uL (ref 4.0–10.5)

## 2017-05-26 LAB — COMPREHENSIVE METABOLIC PANEL
ALT: 13 U/L (ref 0–35)
AST: 18 U/L (ref 0–37)
Albumin: 4 g/dL (ref 3.5–5.2)
Alkaline Phosphatase: 61 U/L (ref 39–117)
BILIRUBIN TOTAL: 0.5 mg/dL (ref 0.2–1.2)
BUN: 9 mg/dL (ref 6–23)
CALCIUM: 9.8 mg/dL (ref 8.4–10.5)
CHLORIDE: 104 meq/L (ref 96–112)
CO2: 28 meq/L (ref 19–32)
CREATININE: 0.98 mg/dL (ref 0.40–1.20)
GFR: 59.56 mL/min — ABNORMAL LOW (ref >60.00)
GLUCOSE: 105 mg/dL — AB (ref 70–99)
Potassium: 4.3 mEq/L (ref 3.5–5.1)
SODIUM: 139 meq/L (ref 135–145)
Total Protein: 7.1 g/dL (ref 6.0–8.3)

## 2017-05-26 MED ORDER — ESOMEPRAZOLE MAGNESIUM 40 MG PO CPDR: 40.0000 mg | DELAYED_RELEASE_CAPSULE | Freq: Every day | ORAL | 6 refills | Status: DC

## 2017-05-26 NOTE — Patient Instructions (Addendum)
Please go to the basement level to have your labs drawn.  We have sent the following medications to your pharmacy for you to pick up at your convenience: [place]  1. Nexium 40 mg.  You have been scheduled for an endoscopy. Please follow written instructions given to you at your visit today. If you use inhalers (even only as needed), please bring them with you on the day of your procedure. Your physician has requested that you go to www.[email or web address].com and enter the access code given to you at your visit today. This web site gives a general overview about your procedure. However, you should still follow specific instructions given to you by our office regarding your preparation for the procedure.

## 2017-05-26 NOTE — Progress Notes (Signed)
Subjective:    Patient ID: Donna Bailey, female    DOB: 1946/05/04, 71 y.o.   MRN: [id number]  HPI Donna Bailey is a 71 year old white female, known to Dr. Marina Goodell last seen in our office in 2016 who comes in today with complaints of nausea and dyspepsia. Patient has history of chronic GERD and is maintained on Nexium 20 mg by mouth daily. Also with short segment Barrett's, of adenomatous colon polyps, fibromyalgia, degenerative joint disease and history of cerebral aneurysm. Patient has a multitude of allergies, including food allergies. She last had colonoscopy in November 2016 with finding of a 3 mm ascending colon polyp and a 1 cm soft fleshy lesion in the anal canal. Biopsy from the polyp showed a tubular adenoma and the anal lesion was benign hyperplastic squamous tissue. EGD November 2016 with finding of a 1.5 cm segment of Barrett's biopsies showed no dysplasia, she has moderate hiatal hernia and 9 fundic gland polyps noted. She was to follow-up in 5 years. She says she's been having problems with her stomach all some her, initially thought she had a GI bug but symptoms have worsened somewhat to the point where she is having a hard time eating. She says she will eat a few bites and have upper abdominal discomfort and nausea worse over the past month.'s also having trouble lying down because she gets increased discomfort high in her epigastrium and up into her chest and feels better sitting up. She denies any dysphagia. She's not had any vomiting but does describe fullness postprandially. Bowel movements tend to alternate with no change in pattern no melena or hematochezia. She denies new regular NSAID use. Weight has gone from 205- to 191 this year. Patient relates very remote history of peptic ulcer disease and H. pylori and says that these symptoms feel very similar.  Review of Systems Pertinent positive and negative review of systems were noted in the above HPI section.  All other  review of systems was otherwise negative.  Outpatient Encounter Prescriptions as of 05/26/2017  Medication Sig  . Cyanocobalamin (VITAMIN B-12 IJ) Inject as directed every 30 (thirty) days.  Marland Kitchen esomeprazole (NEXIUM) 20 MG capsule Take 20 mg by mouth daily at 12 noon.  . ondansetron (ZOFRAN-ODT) 4 MG disintegrating tablet Take 4 mg by mouth 3 (three) times daily as needed for nausea. Sublingual only  . zolpidem (AMBIEN) 10 MG tablet Take 1 tablet (10 mg total) by mouth at bedtime.  Marland Kitchen esomeprazole (NEXIUM) 40 MG capsule Take 1 capsule (40 mg total) by mouth daily at 12 noon.  . [DISCONTINUED] fluticasone furoate-vilanterol (BREO ELLIPTA) 100-25 MCG/INH AEPB Inhale 1 puff into the lungs daily.  . [DISCONTINUED] PRESCRIPTION MEDICATION Apply 1 application topically 3 (three) times daily. Gabapentin cream-1-2 x a day now   No facility-administered encounter medications on file as of 05/26/2017.    Allergies  Allergen Reactions  . Sulfonamide Derivatives Anaphylaxis  . Penicillins Rash    Underarms (both) Has patient had a PCN reaction causing immediate rash, facial/tongue/throat swelling, SOB or lightheadedness with hypotension: YES Has patient had a PCN reaction causing severe rash involving mucus membranes or skin necrosis: NO Has patient had a PCN reaction that required hospitalization NO Has patient had a PCN reaction occurring within the last 10 years: NO If all of the above answers are "NO", then may proceed with Cephalosporin use.  Joyce Copa [Fexofenadine]     Heart race  . Avelox [Moxifloxacin]     Doesn't remember  .  Caffeine     Heart race  . Cephalexin   . Ciprofloxacin Other (See Comments)    unknown  . Corn-Containing Products Itching and Other (See Comments)    Can not walk if have a lot of it.  . Crestor [Rosuvastatin]   . Formoterol Fumarate Other (See Comments)    unknown  . Levofloxacin     REACTION: HEART RACING  . Levofloxacin In D5w Other (See Comments)    Other  Reaction: racing heart  . Macrodantin [Nitrofurantoin Macrocrystal] Itching  . Neomycin Sulfate [Neomycin]     Doesn't remember  . Ofloxacin Itching  . Other     Corn fillers, corn by-products - causes severe itching Polyester-"pins sticking in her skin" Gold  . Sulfa Antibiotics   . Wheat Other (See Comments)    Pain in joints  . Adhesive [Tape] Rash  . Ibuprofen Rash  . Mold Extract [Trichophyton Mentagrophyte] Other (See Comments)    Bumps on back, stops up sinuses.  . Nickel Rash  . Tylenol [Acetaminophen] Rash    On face   Patient Active Problem List   Diagnosis Date Noted  . Cerebral aneurysm 10/31/2015  . Obesity (BMI 30-39.9)   . Hyperlipidemia 08/26/2011  . Tinea 08/26/2011  . Food intolerance 03/25/2011  . Personal history of colonic polyps 05/15/2010  . Asthma with bronchitis 03/28/2010  . Seasonal and perennial allergic rhinitis 11/29/2007  . TMJ SYNDROME 11/29/2007  . GERD 11/29/2007  . HIATAL HERNIA 11/29/2007  . DEGENERATIVE JOINT DISEASE 11/29/2007  . FIBROMYALGIA 11/29/2007  . SCOLIOSIS 11/29/2007  . Insomnia 11/29/2007  . Barrett esophagus    Social History   Social History  . Marital status: Married    Spouse name: N/A  . Number of children: 0  . Years of education: N/A   Occupational History  . retired Retired   Social History Main Topics  . Smoking status: Never Smoker  . Smokeless tobacco: Never Used  . Alcohol use No  . Drug use: No  . Sexual activity: Not on file   Other Topics Concern  . Not on file   Social History Narrative   Lives with husband in a 2 story home.  Has no children.  Retired Warden/ranger.  Education: college.     Ms. Ericksen family history includes Breast cancer in her sister; Cancer in her sister; Cancer (age of onset: 81) in her sister; Colitis in her mother; Colon polyps in her brother; Diverticulosis in her mother; Heart disease in her father and mother; Leukemia in her sister; Tuberculosis in her  brother.      Objective:    Vitals:   05/26/17 1055  BP: 140/90  Pulse: 80    Physical Exam  well-developed older white female in no acute distress, accompanied by her husband blood pressure 140/90 pulse 80, BMI 30.8. HEENT; nontraumatic normocephalic EOMI PERRLA sclera anicteric, Cardiovascular ;regular rate and rhythm with S1-S2 no murmur or gallop, Pulmonary; clear, Abdomen; soft, she has some mild epigastric tenderness no guarding or rebound no palpable mass or hepatosplenomegaly bowel sounds are present, Rectal ;exam not done, Ext;no clubbing cyanosis or edema skin warm and dry, Neuropsych; mood and affect appropriate       Assessment & Plan:   #30 71 year old white female with remote history of peptic ulcer disease, history of chronic GERD and Barrett's esophagus who presents with 3 month history of dyspepsia and nausea, some early satiety and upper abdominal fullness. Rule out peptic ulcer disease, rule out  gastric lesion, rule out functional dyspepsia #2 history of Barrett's esophagus last EGD November 2016, no dysplasia follow-up planned at 5 year interval #3 history of adenomatous colon polyps-up-to-date with colonoscopy last done November 2016, due for follow-up November 2021 #4 multiple drug and food allergies including corn and sucrose #5 fibromyalgia #6 history of cerebral aneurysm  Plan; Will increase Nexium to 40 mg by mouth every morning She has Zofran at home and can use this as needed Patient unable to take Carafate, after I reviewed this does contain sucrose Will schedule for upper endoscopy with Dr. Marina Goodell, would biopsy for H. pylori at that time. Procedure discussed in detail with patient including risks and benefits and she is agreeable to proceed CBC with differential, CMET today  Cheyane Ayon S Autumne Kallio PA-C 05/26/2017   Cc: Daisy Floro, MD

## 2017-05-26 NOTE — Progress Notes (Signed)
Initial assessment and plans reviewed 

## 2017-06-03 ENCOUNTER — Encounter: Payer: Self-pay | Admitting: Internal Medicine

## 2017-06-03 ENCOUNTER — Ambulatory Visit (AMBULATORY_SURGERY_CENTER): Payer: Medicare Other | Admitting: Internal Medicine

## 2017-06-03 VITALS — BP 108/74 | HR 63 | Temp 97.5°F | Resp 15 | Ht 66.0 in | Wt 191.0 lb

## 2017-06-03 DIAGNOSIS — R110 Nausea: Secondary | ICD-10-CM | POA: Diagnosis not present

## 2017-06-03 DIAGNOSIS — R1013 Epigastric pain: Secondary | ICD-10-CM

## 2017-06-03 DIAGNOSIS — K2950 Unspecified chronic gastritis without bleeding: Secondary | ICD-10-CM | POA: Diagnosis not present

## 2017-06-03 DIAGNOSIS — K2270 Barrett's esophagus without dysplasia: Secondary | ICD-10-CM

## 2017-06-03 MED ORDER — SODIUM CHLORIDE 0.9 % IV SOLN: 500.0000 mL | INTRAVENOUS | Status: DC

## 2017-06-03 NOTE — Op Note (Signed)
[facility] Patient Name: Donna Bailey Procedure Date: 06/03/2017 11:42 AM MRN: [id number] Endoscopist: Docia Chuck. Henrene Pastor , MD Age: 71 Referring MD:  Date of Birth: August 28, 1945 Gender: Female Account #: [id number] Procedure:                Upper GI endoscopy, with biopsies Indications:              Epigastric abdominal pain, Nausea, weight loss Medicines:                Monitored Anesthesia Care Procedure:                Pre-Anesthesia Assessment:                           - Prior to the procedure, a History and Physical                            was performed, and patient medications and                            allergies were reviewed. The patient's tolerance of                            previous anesthesia was also reviewed. The risks                            and benefits of the procedure and the sedation                            options and risks were discussed with the patient.                            All questions were answered, and informed consent                            was obtained. Prior Anticoagulants: The patient has                            taken no previous anticoagulant or antiplatelet                            agents. ASA Grade Assessment: II - A patient with                            mild systemic disease. After reviewing the risks                            and benefits, the patient was deemed in                            satisfactory condition to undergo the procedure.                           After obtaining informed consent, the endoscope was  passed under direct vision. Throughout the                            procedure, the patient's blood pressure, pulse, and                            oxygen saturations were monitored continuously. The                            Endoscope was introduced through the mouth, and                            advanced to the second part of duodenum. The upper            GI endoscopy was accomplished without difficulty.                            The patient tolerated the procedure well. Scope In: Scope Out: Findings:                 Barrett's esophagus was present in the lower third                            of the esophagus. This measured 1.5 cm. No                            nodularity or inflammation. Mucosa was biopsied                            with a cold forceps for histology in 4 quadrants.                           The esophagus was otherwise normal.                           The stomach revealed multiple fundic gland polyps                            as previous. Exam was otherwise normal.                           The examined duodenum was normal.                           The cardia and gastric fundus were normal on                            retroflexion. Complications:            No immediate complications. Estimated Blood Loss:     Estimated blood loss: none. Impression:               - Barrett's esophagus. Biopsied.                           - Normal otherwise.                           -  Benign gastric polyps.                           - Normal examined duodenum. Recommendation:           - Patient has a contact number available for                            emergencies. The signs and symptoms of potential                            delayed complications were discussed with the                            patient. Return to normal activities tomorrow.                            Written discharge instructions were provided to the                            patient.                           - Resume previous diet.                           - Continue present medications, including higher                            dose of Nexium and antinausea medicines                           - Schedule contrast-enhanced CT scan of the abdomen                            and pelvis with pancreatic protocol "nausea,                             epigastric pain, weight loss"                           - GI clinic follow-up in 6 weeks John N. Henrene Pastor, MD 06/03/2017 12:04:44 PM This report has been signed electronically.

## 2017-06-03 NOTE — Progress Notes (Signed)
Report to PACU, RN, vss, BBS= Clear.  

## 2017-06-03 NOTE — Progress Notes (Signed)
Patient was given 2 bottles in recovery for CT scan.

## 2017-06-03 NOTE — Progress Notes (Signed)
Called to room to assist during endoscopic procedure.  Patient ID and intended procedure confirmed with present staff. Received instructions for my participation in the procedure from the performing physician.  

## 2017-06-03 NOTE — Patient Instructions (Signed)
**   Handout given on Barretts **  CT bottle given to patient.   YOU HAD AN ENDOSCOPIC PROCEDURE TODAY AT [facility]:   Refer to the procedure report that was given to you for any specific questions about what was found during the examination.  If the procedure report does not answer your questions, please call your gastroenterologist to clarify.  If you requested that your care partner not be given the details of your procedure findings, then the procedure report has been included in a sealed envelope for you to review at your convenience later.  YOU SHOULD EXPECT: Some feelings of bloating in the abdomen. Passage of more gas than usual.  Walking can help get rid of the air that was put into your GI tract during the procedure and reduce the bloating. If you had a lower endoscopy (such as a colonoscopy or flexible sigmoidoscopy) you may notice spotting of blood in your stool or on the toilet paper. If you underwent a bowel prep for your procedure, you may not have a normal bowel movement for a few days.  Please Note:  You might notice some irritation and congestion in your nose or some drainage.  This is from the oxygen used during your procedure.  There is no need for concern and it should clear up in a day or so.  SYMPTOMS TO REPORT IMMEDIATELY:    Following upper endoscopy (EGD)  Vomiting of blood or coffee ground material  New chest pain or pain under the shoulder blades  Painful or persistently difficult swallowing  New shortness of breath  Fever of 100F or higher  Black, tarry-looking stools  For urgent or emergent issues, a gastroenterologist can be reached at any hour by calling [phone].   DIET:  We do recommend a small meal at first, but then you may proceed to your regular diet.  Drink plenty of fluids but you should avoid alcoholic beverages for 24 hours.  ACTIVITY:  You should plan to take it easy for the rest of today and you should NOT DRIVE or use  heavy machinery until tomorrow (because of the sedation medicines used during the test).    FOLLOW UP: Our staff will call the number listed on your records the next business day following your procedure to check on you and address any questions or concerns that you may have regarding the information given to you following your procedure. If we do not reach you, we will leave a message.  However, if you are feeling well and you are not experiencing any problems, there is no need to return our call.  We will assume that you have returned to your regular daily activities without incident.  If any biopsies were taken you will be contacted by phone or by letter within the next 1-3 weeks.  Please call us at [phone] if you have not heard about the biopsies in 3 weeks.    SIGNATURES/CONFIDENTIALITY: You and/or your care partner have signed paperwork which will be entered into your electronic medical record.  These signatures attest to the fact that that the information above on your After Visit Summary has been reviewed and is understood.  Full responsibility of the confidentiality of this discharge information lies with you and/or your care-partner.

## 2017-06-04 ENCOUNTER — Other Ambulatory Visit: Payer: Self-pay

## 2017-06-04 ENCOUNTER — Telehealth: Payer: Self-pay

## 2017-06-04 DIAGNOSIS — R112 Nausea with vomiting, unspecified: Secondary | ICD-10-CM

## 2017-06-04 DIAGNOSIS — R634 Abnormal weight loss: Secondary | ICD-10-CM

## 2017-06-04 NOTE — Telephone Encounter (Signed)
Made 2nd attempt today to reach patient for post-procedure f/u call. No answer. Left message that we hope she has recovered well from her procedure yesterday and for her to please not hesitate to call us if she has any questions/concerns regarding her care.

## 2017-06-04 NOTE — Telephone Encounter (Signed)
Pt scheduled for CT of A/P pancreatic protocol at [facility] 06/11/17@3pm , pt to arrive there at 2:30pm. Pt to have no solid food after 11am, she may continue to have clear liquids. Pt aware. Pt scheduled for follow-up with [name] 08/05/17@9 :15am. Pt aware of appt.

## 2017-06-04 NOTE — Telephone Encounter (Signed)
Attempted to reach patient for post-procedure f/u call. No answer. Left message that we will attempt to reach her again later today and for her to please not hesitate to call us if she has any concerns/questions regarding her care.

## 2017-06-08 ENCOUNTER — Encounter: Payer: Self-pay | Admitting: Internal Medicine

## 2017-06-11 ENCOUNTER — Ambulatory Visit (INDEPENDENT_AMBULATORY_CARE_PROVIDER_SITE_OTHER)
Admission: RE | Admit: 2017-06-11 | Discharge: 2017-06-11 | Disposition: A | Discharge status: Home / Self Care | Payer: Medicare Other | Source: Ambulatory Visit | Attending: Internal Medicine | Admitting: Internal Medicine

## 2017-06-11 DIAGNOSIS — R112 Nausea with vomiting, unspecified: Secondary | ICD-10-CM

## 2017-06-11 DIAGNOSIS — R634 Abnormal weight loss: Secondary | ICD-10-CM

## 2017-06-11 MED ORDER — IOPAMIDOL (ISOVUE-300) INJECTION 61%
100.0000 mL | Freq: Once | INTRAVENOUS | Status: AC | PRN
  Administered 2017-06-11: 100 mL via INTRAVENOUS

## 2017-06-22 ENCOUNTER — Telehealth: Payer: Self-pay

## 2017-06-22 ENCOUNTER — Ambulatory Visit (INDEPENDENT_AMBULATORY_CARE_PROVIDER_SITE_OTHER): Payer: Medicare Other | Admitting: Neurology

## 2017-06-22 ENCOUNTER — Encounter: Payer: Self-pay | Admitting: Neurology

## 2017-06-22 VITALS — BP 130/84 | HR 56 | Ht 66.0 in | Wt 189.0 lb

## 2017-06-22 DIAGNOSIS — R51 Headache: Secondary | ICD-10-CM | POA: Diagnosis not present

## 2017-06-22 DIAGNOSIS — H8110 Benign paroxysmal vertigo, unspecified ear: Secondary | ICD-10-CM | POA: Diagnosis not present

## 2017-06-22 DIAGNOSIS — G8929 Other chronic pain: Secondary | ICD-10-CM

## 2017-06-22 DIAGNOSIS — I729 Aneurysm of unspecified site: Secondary | ICD-10-CM

## 2017-06-22 NOTE — Progress Notes (Signed)
NEUROLOGY FOLLOW UP OFFICE NOTE  Donna Bailey [id number]  HISTORY OF PRESENT ILLNESS: Donna Bailey is a 71 year old right-handed female with migraine, myalgia, cervical disc disease, lumbar pain, fibromyalgia and cerebral aneurysm who follows up for chronic headache.  UPDATE: CT/CTA of head from 01/21/17 was personally reviewed and revealed stable 79mm paraophthalmic segment right ICA aneurysm with no evidence of any other aneurysm.    She has an allergy to corn, so we checked to see which medications may have corn filler.  Amitriptyline, topiramate or gabapentin.  She reportedly tried amitriptyline in the past but doesn't want to take it due to weight gain.  It appears that both gabapentin and topiramate have corn filler.   IConstant mild to moderate headache.  She has also been worked up for chronic nausea. Current anti-emetic:  Zofran ODT 4mg  Current sleep aide:  Ambien Current Antihypertensive medications:  no Current Antidepressant medications:  no Current Anticonvulsant medications:  no Current Vitamins/Herbal/Supplements:  B12 Current Antihistamines/Decongestants:  no Other therapy:  No  Vertigo has improved.  It lasts less than a minute and occurs 3 times a month.   HISTORY: She has a known right intracranial internal carotid artery aneurysm, discovered after right retro-orbital headaches in 2014.  No intervention was performed at that time and was recommended to monitor.  Last fall, she had bilateral cataract surgery and developed a new type of headache.  The headaches are located in the right temporal-parietal region, which is nonthrobbing pressure, as well as a sharp right retro-orbital pain.  They are typically 4/10 intensity.  There are no particular triggers.  It is constant without any relief.  There is no associated visual disturbance, nausea, photophobia, ptosis, conjunctival injection, or asymmetric pupils.  She notes that she has to wipe her eye at times.   She has not been taking any medication because she has an allergy to corn and many medications have corn-fillers, such as NSAIDs (it causes severe itching).  She is also allergic to Tylenol.  She previously took sublingual tramadol, which worked.  However, it is no longer manufactured and the regular tramadol has corn filler.   She was concerned about her aneurysm, so she followed up with Dr. Estanislado Pandy.  She had another MRI and MRA of the brain on 08/27/16, which was personally reviewed and revealed 4.3 mm x 3.1 mm right internal carotid artery superior hypophyseal region aneurysm.  There was the possibility of another 3 mm aneurysm versus vessel loop in the right MCA bifurcation, which was not demonstrated on prior angiogram in 2014.  No intervention was recommended.   On 01/02/17, she heard a sudden popping sound and the right sided headache was suddenly reduced to a 1/10.  For a day, she had pressure behind the left eye, but then it resolved.   She has history of cervical disc pain.  She sustained a cervical vertebral fracture in the 1980s.  She is followed by a neurologist in [place], who does not treat headaches.  She receives injections in her neck.  She also sees the chiropractor regularly.  There is no associated improvement in headache with treatment.  She also reports a diagnosis of peripheral neuropathy, which is also treated by her other neurologist.  She also endorses brief positional vertigo when she quickly turns her neck to the left.   She reports some stress but no depression.  Her sleep is poor.  She often gets up in the middle of the night to go  to the bathroom and cannot fall back to sleep.  She averages 4 hours per night.  She seldom takes Ambien.  PAST MEDICAL HISTORY: Past Medical History:  Diagnosis Date  . Allergic rhinitis, cause unspecified   . Anemia   . Anemia   . Aneurysm of right conjunctiva    right eye   . Anxiety   . Asthma   . Barrett's esophagus   .  Constipation   . Cystocele   . Deviated nasal septum   . Diaphragmatic hernia without mention of obstruction or gangrene   . Esophageal reflux   . Fibromyalgia   . H/O hiatal hernia   . Insomnia, unspecified   . Kidney stones    hx of pt see Dr. Risa Grill  . Migraine   . Myalgia and myositis, unspecified   . Osteoarthrosis, unspecified whether generalized or localized, unspecified site   . Peripheral neuropathy   . Pneumonia    hx of  . Pure hypercholesterolemia   . Rectocele   . Scoliosis (and kyphoscoliosis), idiopathic   . Temporomandibular joint disorders, unspecified   . Wheat allergy     MEDICATIONS: Current Outpatient Medications on File Prior to Visit  Medication Sig Dispense Refill  . Cyanocobalamin (VITAMIN B-12 IJ) Inject as directed every 30 (thirty) days.    Marland Kitchen esomeprazole (NEXIUM) 20 MG capsule Take 20 mg by mouth daily at 12 noon.    Marland Kitchen esomeprazole (NEXIUM) 40 MG capsule Take 1 capsule (40 mg total) by mouth daily at 12 noon. 30 capsule 6  . ondansetron (ZOFRAN-ODT) 4 MG disintegrating tablet Take 4 mg by mouth 3 (three) times daily as needed for nausea. Sublingual only    . zolpidem (AMBIEN) 10 MG tablet Take 1 tablet (10 mg total) by mouth at bedtime. 30 tablet 5   Current Facility-Administered Medications on File Prior to Visit  Medication Dose Route Frequency Provider Last Rate Last Dose  . 0.9 %  sodium chloride infusion  500 mL Intravenous Continuous Irene Shipper, MD        ALLERGIES: Allergies  Allergen Reactions  . Sulfonamide Derivatives Anaphylaxis  . Penicillins Rash    Underarms (both) Has patient had a PCN reaction causing immediate rash, facial/tongue/throat swelling, SOB or lightheadedness with hypotension: YES Has patient had a PCN reaction causing severe rash involving mucus membranes or skin necrosis: NO Has patient had a PCN reaction that required hospitalization NO Has patient had a PCN reaction occurring within the last 10 years: NO If  all of the above answers are "NO", then may proceed with Cephalosporin use.  Delma Freeze [Fexofenadine]     Heart race  . Avelox [Moxifloxacin]     Doesn't remember  . Caffeine     Heart race  . Cephalexin   . Ciprofloxacin Other (See Comments)    unknown  . Corn-Containing Products Itching and Other (See Comments)    Can not walk if have a lot of it.  . Crestor [Rosuvastatin]   . Formoterol Fumarate Other (See Comments)    unknown  . Levofloxacin     REACTION: HEART RACING  . Levofloxacin In D5w Other (See Comments)    Other Reaction: racing heart  . Macrodantin [Nitrofurantoin Macrocrystal] Itching  . Neomycin Sulfate [Neomycin]     Doesn't remember  . Ofloxacin Itching  . Other     Corn fillers, corn by-products - causes severe itching Polyester-"pins sticking in her skin" Gold  . Sulfa Antibiotics   . Wheat Other (  See Comments)    Pain in joints  . Adhesive [Tape] Rash  . Ibuprofen Rash  . Mold Extract [Trichophyton Mentagrophyte] Other (See Comments)    Bumps on back, stops up sinuses.  . Nickel Rash  . Tylenol [Acetaminophen] Rash    On face    FAMILY HISTORY: Family History  Problem Relation Age of Onset  . Breast cancer Sister   . Cancer Sister        breast  . Colitis Mother   . Heart disease Mother   . Diverticulosis Mother   . Heart disease Father   . Leukemia Sister   . Cancer Sister 30       leukemia  . Colon polyps Brother   . Tuberculosis Brother   . Colon cancer Neg Hx   . Esophageal cancer Neg Hx   . Rectal cancer Neg Hx   . Stomach cancer Neg Hx     SOCIAL HISTORY: Social History   Socioeconomic History  . Marital status: Married    Spouse name: Not on file  . Number of children: 0  . Years of education: Not on file  . Highest education level: Not on file  Social Needs  . Financial resource strain: Not on file  . Food insecurity - worry: Not on file  . Food insecurity - inability: Not on file  . Transportation needs - medical:  Not on file  . Transportation needs - non-medical: Not on file  Occupational History  . Occupation: retired    Fish farm manager: RETIRED  Tobacco Use  . Smoking status: Never Smoker  . Smokeless tobacco: Never Used  Substance and Sexual Activity  . Alcohol use: No    Alcohol/week: 0.0 oz  . Drug use: No  . Sexual activity: Not on file  Other Topics Concern  . Not on file  Social History Narrative   Lives with husband in a 2 story home.  Has no children.  Retired Art therapist.  Education: college.     REVIEW OF SYSTEMS: Constitutional: No fevers, chills, or sweats, no generalized fatigue, change in appetite Eyes: No visual changes, double vision, eye pain Ear, nose and throat: No hearing loss, ear pain, nasal congestion, sore throat Cardiovascular: No chest pain, palpitations Respiratory:  No shortness of breath at rest or with exertion, wheezes GastrointestinaI: No nausea, vomiting, diarrhea, abdominal pain, fecal incontinence Genitourinary:  No dysuria, urinary retention or frequency Musculoskeletal:  No neck pain, back pain Integumentary: No rash, pruritus, skin lesions Neurological: as above Psychiatric: No depression, insomnia, anxiety Endocrine: No palpitations, fatigue, diaphoresis, mood swings, change in appetite, change in weight, increased thirst Hematologic/Lymphatic:  No purpura, petechiae. Allergic/Immunologic: no itchy/runny eyes, nasal congestion, recent allergic reactions, rashes  PHYSICAL EXAM: Vitals:   06/22/17 0722  BP: 130/84  Pulse: (!) 56  SpO2: 94%   General: No acute distress.  Patient appears well-groomed.   Head:  Normocephalic/atraumatic Eyes:  Fundi examined but not visualized Neck: supple, no paraspinal tenderness, full range of motion Heart:  Regular rate and rhythm Lungs:  Clear to auscultation bilaterally Back: No paraspinal tenderness Neurological Exam: alert and oriented to person, place, and time. Attention span and concentration intact,  recent and remote memory intact, fund of knowledge intact.  Speech fluent and not dysarthric, language intact.  CN II-XII intact. Bulk and tone normal, muscle strength 5/5 throughout.  Sensation to light touch  intact.  Deep tendon reflexes 2+ throughout.  Finger to nose testing intact.  Gait normal  IMPRESSION:  1.  Right sided headache.  Suspect chronic tension-type headache. Less likely diagnoses include cervicogenic (headache not responds to cervical treatments) and hemicrania continua (dysautonomic symptoms absent).   2.  Benign paroxysmal positional vertigo, improved.  Treatment options are limited.  Nortriptyline, gabapentin, topiramate and sertraline all have corn filler.  There is one brand of amitriptyline that does not have corn filler.  She previously did not like amitriptyline due to weight gain, however that is the only option at this point.  Metta Clines, DO  CC: C. Melinda Crutch, MD

## 2017-06-22 NOTE — Telephone Encounter (Signed)
Pt called office, was trx into [employer], LPN VM. Pt states she wiil use biofreeze for now, she does not want to try amitriptyline.

## 2017-06-22 NOTE — Patient Instructions (Addendum)
We will check to see if any possible medications has corn filler In the meantime, use Biofreeze on your temples to sooth the pain

## 2017-06-22 NOTE — Telephone Encounter (Signed)
[place], spoke with [facility] concerning corn fillers in Topamax, Zoloft and Amitriptyline. Amitriptyline, the [employer] brand is the only one that does not contain any type of corn filler. Called Pt, LM on VM to rtrn call.

## 2017-06-23 ENCOUNTER — Encounter: Payer: Self-pay | Admitting: Neurology

## 2017-06-30 ENCOUNTER — Ambulatory Visit: Payer: Medicare Other | Admitting: Internal Medicine

## 2017-08-05 ENCOUNTER — Ambulatory Visit: Payer: Medicare Other | Admitting: Internal Medicine

## 2017-08-14 ENCOUNTER — Other Ambulatory Visit (HOSPITAL_COMMUNITY): Payer: Self-pay | Admitting: Family Medicine

## 2017-08-14 ENCOUNTER — Other Ambulatory Visit (HOSPITAL_COMMUNITY): Payer: Self-pay | Admitting: Obstetrics and Gynecology

## 2017-08-14 DIAGNOSIS — R110 Nausea: Secondary | ICD-10-CM

## 2017-08-14 DIAGNOSIS — R1011 Right upper quadrant pain: Secondary | ICD-10-CM

## 2017-08-21 ENCOUNTER — Ambulatory Visit (HOSPITAL_COMMUNITY): Payer: Medicare Other

## 2017-08-24 ENCOUNTER — Other Ambulatory Visit: Payer: Self-pay | Admitting: Family Medicine

## 2017-08-24 DIAGNOSIS — R1011 Right upper quadrant pain: Secondary | ICD-10-CM

## 2017-08-24 DIAGNOSIS — R110 Nausea: Secondary | ICD-10-CM

## 2017-08-25 ENCOUNTER — Ambulatory Visit
Admission: RE | Admit: 2017-08-25 | Discharge: 2017-08-25 | Disposition: A | Discharge status: Home / Self Care | Payer: Medicare Other | Source: Ambulatory Visit | Attending: Family Medicine | Admitting: Family Medicine

## 2017-08-25 DIAGNOSIS — R110 Nausea: Secondary | ICD-10-CM

## 2017-08-25 DIAGNOSIS — R1011 Right upper quadrant pain: Secondary | ICD-10-CM

## 2017-08-26 ENCOUNTER — Other Ambulatory Visit: Payer: Medicare Other

## 2017-08-28 ENCOUNTER — Ambulatory Visit (HOSPITAL_COMMUNITY)
Admission: RE | Admit: 2017-08-28 | Discharge: 2017-08-28 | Disposition: A | Discharge status: Home / Self Care | Payer: Medicare Other | Source: Ambulatory Visit | Attending: Family Medicine | Admitting: Family Medicine

## 2017-08-28 DIAGNOSIS — R1011 Right upper quadrant pain: Secondary | ICD-10-CM | POA: Insufficient documentation

## 2017-08-28 DIAGNOSIS — R110 Nausea: Secondary | ICD-10-CM | POA: Diagnosis present

## 2017-08-28 MED ORDER — TECHNETIUM TC 99M MEBROFENIN IV KIT
5.3000 | PACK | Freq: Once | INTRAVENOUS | Status: AC | PRN
  Administered 2017-08-28: 5.3 via INTRAVENOUS

## 2017-08-31 ENCOUNTER — Telehealth: Payer: Self-pay | Admitting: Internal Medicine

## 2017-08-31 MED ORDER — DOXYCYCLINE HYCLATE 100 MG PO TABS: 100.0000 mg | ORAL_TABLET | Freq: Two times a day (BID) | ORAL | 0 refills | Status: DC

## 2017-08-31 NOTE — Telephone Encounter (Signed)
Offer doxycycline 100 mg, # 14, 1 twice daily Stay well hydrated 

## 2017-08-31 NOTE — Telephone Encounter (Signed)
Called and spoke to pt. Pt states she went to an urgent care last week and was given a Zpak for her s/s. Pt is suppose to complete the Zpak on 1.29.2019. Pt states she is still complaining of rattling in her chest, prod cough with dark yellow/brown mucus, and sore throat. Pt states the zpak does not seem to be helping her s/s but she found an older bottle of Tussionex and took some last night and states her cough improved in severity and allowed her to sleep. Pt coughed while on the phone and sounds very congested in chest. Pt denies worsening in SOB, f/c/s, body aches, and CP/tightness. Pt was requesting an appt, advised pt we do not have any current openings but will still send message to [facility] for recs.   Dr. [name] please advise. Thanks.   Allergies  Allergen Reactions  . Sulfonamide Derivatives Anaphylaxis  . Penicillins Rash    Underarms (both) Has patient had a PCN reaction causing immediate rash, facial/tongue/throat swelling, SOB or lightheadedness with hypotension: YES Has patient had a PCN reaction causing severe rash involving mucus membranes or skin necrosis: NO Has patient had a PCN reaction that required hospitalization NO Has patient had a PCN reaction occurring within the last 10 years: NO If all of the above answers are "NO", then may proceed with Cephalosporin use.  Delma Freeze [Fexofenadine]     Heart race  . Avelox [Moxifloxacin]     Doesn't remember  . Caffeine     Heart race  . Cephalexin   . Ciprofloxacin Other (See Comments)    unknown  . Corn-Containing Products Itching and Other (See Comments)    Can not walk if have a lot of it.  . Crestor [Rosuvastatin]   . Formoterol Fumarate Other (See Comments)    unknown  . Levofloxacin     REACTION: HEART RACING  . Levofloxacin In D5w Other (See Comments)    Other Reaction: racing heart  . Macrodantin [Nitrofurantoin Macrocrystal] Itching  . Neomycin Sulfate [Neomycin]     Doesn't remember  . Ofloxacin Itching  . Other      Corn fillers, corn by-products - causes severe itching Polyester-"pins sticking in her skin" Gold  . Sulfa Antibiotics   . Wheat Other (See Comments)    Pain in joints  . Adhesive [Tape] Rash  . Ibuprofen Rash  . Mold Extract [Trichophyton Mentagrophyte] Other (See Comments)    Bumps on back, stops up sinuses.  . Nickel Rash  . Tylenol [Acetaminophen] Rash    On face    Current Outpatient Medications on File Prior to Visit  Medication Sig Dispense Refill  . Cyanocobalamin (VITAMIN B-12 IJ) Inject as directed every 30 (thirty) days.    Marland Kitchen esomeprazole (NEXIUM) 20 MG capsule Take 20 mg by mouth daily at 12 noon.    Marland Kitchen esomeprazole (NEXIUM) 40 MG capsule Take 1 capsule (40 mg total) by mouth daily at 12 noon. 30 capsule 6  . ondansetron (ZOFRAN-ODT) 4 MG disintegrating tablet Take 4 mg by mouth 3 (three) times daily as needed for nausea. Sublingual only    . zolpidem (AMBIEN) 10 MG tablet Take 1 tablet (10 mg total) by mouth at bedtime. 30 tablet 5   Current Facility-Administered Medications on File Prior to Visit  Medication Dose Route Frequency Provider Last Rate Last Dose  . 0.9 %  sodium chloride infusion  500 mL Intravenous Continuous Irene Shipper, MD

## 2017-08-31 NOTE — Telephone Encounter (Signed)
Called pt letting her know CY said we could send an Rx to her pharmacy of doxycycline to see if that would help with her symptoms.  Pt stated that was fine. Verified pt's pharmacy and Rx sent in.  Nothing further needed at this current time.

## 2017-09-07 ENCOUNTER — Ambulatory Visit: Payer: Medicare Other | Admitting: Internal Medicine

## 2017-09-09 ENCOUNTER — Encounter: Payer: Self-pay | Admitting: Internal Medicine

## 2017-09-09 ENCOUNTER — Other Ambulatory Visit (INDEPENDENT_AMBULATORY_CARE_PROVIDER_SITE_OTHER): Payer: Medicare Other

## 2017-09-09 ENCOUNTER — Ambulatory Visit (INDEPENDENT_AMBULATORY_CARE_PROVIDER_SITE_OTHER)
Admission: RE | Admit: 2017-09-09 | Discharge: 2017-09-09 | Disposition: A | Discharge status: Home / Self Care | Payer: Medicare Other | Source: Ambulatory Visit | Attending: Internal Medicine | Admitting: Internal Medicine

## 2017-09-09 ENCOUNTER — Ambulatory Visit (INDEPENDENT_AMBULATORY_CARE_PROVIDER_SITE_OTHER): Payer: Medicare Other | Admitting: Internal Medicine

## 2017-09-09 ENCOUNTER — Other Ambulatory Visit: Payer: Medicare Other

## 2017-09-09 VITALS — BP 110/62 | HR 64 | Ht 66.0 in | Wt 176.5 lb

## 2017-09-09 VITALS — BP 118/66 | HR 92 | Ht 66.0 in | Wt 176.0 lb

## 2017-09-09 DIAGNOSIS — R630 Anorexia: Secondary | ICD-10-CM

## 2017-09-09 DIAGNOSIS — J209 Acute bronchitis, unspecified: Secondary | ICD-10-CM

## 2017-09-09 DIAGNOSIS — R198 Other specified symptoms and signs involving the digestive system and abdomen: Secondary | ICD-10-CM

## 2017-09-09 DIAGNOSIS — J45909 Unspecified asthma, uncomplicated: Secondary | ICD-10-CM

## 2017-09-09 DIAGNOSIS — R1013 Epigastric pain: Secondary | ICD-10-CM

## 2017-09-09 DIAGNOSIS — K219 Gastro-esophageal reflux disease without esophagitis: Secondary | ICD-10-CM | POA: Diagnosis not present

## 2017-09-09 DIAGNOSIS — K2270 Barrett's esophagus without dysplasia: Secondary | ICD-10-CM

## 2017-09-09 DIAGNOSIS — R634 Abnormal weight loss: Secondary | ICD-10-CM | POA: Diagnosis not present

## 2017-09-09 LAB — CBC WITH DIFFERENTIAL/PLATELET
BASOS ABS: 0.1 10*3/uL (ref 0.0–0.1)
Basophils Relative: 1 % (ref 0.0–3.0)
EOS ABS: 0.1 10*3/uL (ref 0.0–0.7)
EOS PCT: 1.5 % (ref 0.0–5.0)
HCT: 46.2 % — ABNORMAL HIGH (ref 36.0–46.0)
HEMOGLOBIN: 15.5 g/dL — AB (ref 12.0–15.0)
LYMPHS ABS: 2.5 10*3/uL (ref 0.7–4.0)
Lymphocytes Relative: 29.9 % (ref 12.0–46.0)
MCHC: 33.6 g/dL (ref 30.0–36.0)
MCV: 90.6 fl (ref 78.0–100.0)
MONO ABS: 0.8 10*3/uL (ref 0.1–1.0)
Monocytes Relative: 9 % (ref 3.0–12.0)
NEUTROS PCT: 58.6 % (ref 43.0–77.0)
Neutro Abs: 4.9 10*3/uL (ref 1.4–7.7)
Platelets: 316 10*3/uL (ref 150.0–400.0)
RBC: 5.1 Mil/uL (ref 3.87–5.11)
RDW: 14.8 % (ref 11.5–15.5)
WBC: 8.4 10*3/uL (ref 4.0–10.5)

## 2017-09-09 MED ORDER — HYDROCOD POLST-CPM POLST ER 10-8 MG/5ML PO SUER: ORAL | 0 refills | Status: DC

## 2017-09-09 MED ORDER — CEFDINIR 300 MG PO CAPS: 300.0000 mg | ORAL_CAPSULE | Freq: Two times a day (BID) | ORAL | 0 refills | Status: DC

## 2017-09-09 MED ORDER — LEVALBUTEROL HCL 0.63 MG/3ML IN NEBU
0.6300 mg | INHALATION_SOLUTION | Freq: Once | RESPIRATORY_TRACT | Status: AC
  Administered 2017-09-09: 0.63 mg via RESPIRATORY_TRACT

## 2017-09-09 NOTE — Patient Instructions (Addendum)
Rocephin 1 gram  IM x 1-NO LONGER OFFERED  Script refilling Tussionex Script sent for omnicef antibiotic. Ok top take with an antihistamine like benadryl or claritin if needed  Order- neb Xop 0.63     Dx acute bronchitis  Order- CXR     DX acute bronchitis             Lab- CBC w diff,  Penicillin allergy assay  Keep October appointment, but call earlier if needed

## 2017-09-09 NOTE — Assessment & Plan Note (Signed)
Discussed reflux precautions with concern for potential aspiration

## 2017-09-09 NOTE — Assessment & Plan Note (Signed)
Exam suggest possibility that she has a right lung pneumonia.  Consider possibility of aspiration.  Antibiotic intolerance.  Penicillin allergy is very unlikely based on discussion.  We will try a cephalosporin.  I gave permission to take an antihistamine with it if she decides she needs to so that we can finish her course. Plan-CXR, Omnicef, CBC w diff

## 2017-09-09 NOTE — Patient Instructions (Signed)
Please follow up as needed 

## 2017-09-09 NOTE — Progress Notes (Signed)
HPI female never smoker followed for bronchitis, allergic rhinitis, chronic insomnia, "intolerance of all corn products", complicated by GERD and multiple medical problems  Seroquel made her too dizzy. Belsomra 15 mg made her muscles stiff and left her groggy but did help her sleep. Rozerem did not help with sleep but made her dizzy She again requests prescription for zolpidem 10 mg, insisting that nothing else has worked as well to help her manage chronic insomnia. She limits foods with magnesium which she says make her feel hot She tells me she is allergic to nickel, gold and polyester "all of which conduct electricity". Therefore she wants a letter for her to present to [place] asking them to change her electric meter to a "noncommunicating electric meter", so that [employer] will stop sending radio waves through her home. She prefers a manual meter check. -----------------------------------------------------------------------------------------------------------------  05/05/17- 72 year old female never smoker followed for bronchitis, allergic rhinitis, chronic insomnia, intolerance of all corn products, complicated by GERD and multiple medical problems FOLLOWS FOR: Pt continues to have slight deep cough; uses Ambien to help sleep-wants Rx printed to hold for refills.  Pt would also like to discuss some of her allergies to meds,etc. Has noted increased dry cough over the last month. Denies nasal congestion. Ears sometimes feel full intermittently. Denies fever, chills, sore throat, adenopathy. Not wheezing. Takes occasional Benadryl. Uses Ambien more nights than not for chronic insomnia without change. We reviewed sleep hygiene basics again. She denies morning carryover, nocturnal confusion, sleepwalking. She tells me she is allergic to nickel, gold and polyester "all of which conduct electricity". Therefore she wants a letter for her to present to [place] asking them to change her electric  meter to a "noncommunicating electric meter", so that [employer] will stop sending radio waves through her home. She prefers a manual meter check.  09/09/17- 72 year old female never smoker followed for bronchitis, allergic rhinitis, chronic insomnia, intolerance of all corn products, multiple idiosyncratic medication reactions-mostly nonallopathic, complicated by GERD and multiple medical problems Had taken Z-Pak and then we offered doxycycline for acute bronchitis ----Asthma: pt has cough-productive-green, yellow, to dark brown-mainly at night for about 1 month now. Has completed 2 Zpaks and no relief. Pt would like to discuss abx and cough syrup needed.  She had taken 2 rounds of Z-Pak took a single doxycycline, experience nausea, vomiting, diarrhea has been having GI upset and has appointment with GI.  Still having some diarrhea.  This may have been a coincidence and not reaction to the doxycycline.  Sputum vaccine.  Remote history of intertriginous rash from penicillin 50 years ago we discussed low likelihood that she has ongoing penicillin allergy.  ROS-see HPI  + = positive Constitutional:   No-   weight loss, night sweats, fevers, chills, fatigue, lassitude. HEENT:   No-  headaches, difficulty swallowing, tooth/dental problems, sore throat,       No- sneezing, no-itching, +ear ache, +nasal congestion, post nasal drip,  CV:  No-   chest pain, orthopnea, PND, swelling in lower extremities, anasarca,  dizziness, palpitations Resp: + shortness of breath with exertion or at rest.              No-  productive cough,  + non-productive cough,  No- coughing up of blood.              No-   change in color of mucus.  No- wheezing.   Skin: Per HPI GI:  No-   heartburn, indigestion, abdominal  pain, nausea, vomiting,  GU:  MS:  No-   joint pain or swelling.   Neuro-     nothing unusual Psych:  No- change in mood or affect. No depression or anxiety.  No memory loss.  OBJ General- Alert, Oriented,  Affect-appropriate, Distress- none acute, + obese Skin- no rash seen Lymphadenopathy- none Head- atraumatic            Eyes- Gross vision intact, PERRLA, conjunctivae clear secretions.            Ears- Hearing grossly normal,             Nose- clear, no-Septal dev, polyps, erosion, perforation             Throat- Mallampati II , mucosa-clear , drainage- none, tonsils- atrophic, + own teeth Neck- flexible , trachea midline, no stridor , thyroid nl, carotid no bruit Chest - symmetrical excursion , unlabored           Heart/CV- RRR , no murmur , no gallop  , no rub, nl s1 s2                           - JVD- none , edema- none, stasis changes- none, varices- none           Lung-+ coarse rhonchi right chest with deep bronchitic cough, unlabored, dullness-none, rub- none,           Chest wall-  Abd-  Br/ Gen/ Rectal- Not done, not indicated Extrem- cyanosis- none, clubbing, none, atrophy- none, strength- nl Neuro- grossly intact to observation

## 2017-09-10 ENCOUNTER — Other Ambulatory Visit: Payer: Self-pay

## 2017-09-10 ENCOUNTER — Telehealth: Payer: Self-pay | Admitting: Internal Medicine

## 2017-09-10 ENCOUNTER — Emergency Department (HOSPITAL_COMMUNITY): Payer: Medicare Other

## 2017-09-10 ENCOUNTER — Encounter (HOSPITAL_COMMUNITY): Payer: Self-pay | Admitting: *Deleted

## 2017-09-10 ENCOUNTER — Emergency Department (HOSPITAL_COMMUNITY)
Admission: EM | Admit: 2017-09-10 | Discharge: 2017-09-10 | Disposition: A | Discharge status: Home / Self Care | Payer: Medicare Other | Attending: Emergency Medicine | Admitting: Emergency Medicine

## 2017-09-10 DIAGNOSIS — Z79899 Other long term (current) drug therapy: Secondary | ICD-10-CM | POA: Diagnosis not present

## 2017-09-10 DIAGNOSIS — R05 Cough: Secondary | ICD-10-CM | POA: Diagnosis present

## 2017-09-10 DIAGNOSIS — J45909 Unspecified asthma, uncomplicated: Secondary | ICD-10-CM | POA: Diagnosis not present

## 2017-09-10 DIAGNOSIS — R197 Diarrhea, unspecified: Secondary | ICD-10-CM | POA: Diagnosis not present

## 2017-09-10 DIAGNOSIS — J189 Pneumonia, unspecified organism: Secondary | ICD-10-CM

## 2017-09-10 DIAGNOSIS — R112 Nausea with vomiting, unspecified: Secondary | ICD-10-CM | POA: Diagnosis not present

## 2017-09-10 DIAGNOSIS — J181 Lobar pneumonia, unspecified organism: Secondary | ICD-10-CM | POA: Diagnosis not present

## 2017-09-10 LAB — CBC WITH DIFFERENTIAL/PLATELET
BASOS ABS: 0 10*3/uL (ref 0.0–0.1)
BASOS PCT: 0 %
EOS ABS: 0 10*3/uL (ref 0.0–0.7)
EOS PCT: 0 %
HCT: 52.8 % — ABNORMAL HIGH (ref 36.0–46.0)
Hemoglobin: 18.3 g/dL — ABNORMAL HIGH (ref 12.0–15.0)
Lymphocytes Relative: 6 %
Lymphs Abs: 0.6 10*3/uL — ABNORMAL LOW (ref 0.7–4.0)
MCH: 31.2 pg (ref 26.0–34.0)
MCHC: 34.7 g/dL (ref 30.0–36.0)
MCV: 90.1 fL (ref 78.0–100.0)
MONO ABS: 0.6 10*3/uL (ref 0.1–1.0)
MONOS PCT: 6 %
Neutro Abs: 9.6 10*3/uL — ABNORMAL HIGH (ref 1.7–7.7)
Neutrophils Relative %: 88 %
PLATELETS: 305 10*3/uL (ref 150–400)
RBC: 5.86 MIL/uL — ABNORMAL HIGH (ref 3.87–5.11)
RDW: 14.9 % (ref 11.5–15.5)
WBC: 10.9 10*3/uL — ABNORMAL HIGH (ref 4.0–10.5)

## 2017-09-10 LAB — COMPREHENSIVE METABOLIC PANEL
ALBUMIN: 3.8 g/dL (ref 3.5–5.0)
ALK PHOS: 61 U/L (ref 38–126)
ALT: 21 U/L (ref 14–54)
AST: 26 U/L (ref 15–41)
Anion gap: 13 (ref 5–15)
BILIRUBIN TOTAL: 0.7 mg/dL (ref 0.3–1.2)
BUN: 13 mg/dL (ref 6–20)
CALCIUM: 9.6 mg/dL (ref 8.9–10.3)
CO2: 21 mmol/L — ABNORMAL LOW (ref 22–32)
CREATININE: 1.32 mg/dL — AB (ref 0.44–1.00)
Chloride: 104 mmol/L (ref 101–111)
GFR calc Af Amer: 46 mL/min — ABNORMAL LOW (ref >60)
GFR calc non Af Amer: 40 mL/min — ABNORMAL LOW (ref >60)
GLUCOSE: 148 mg/dL — AB (ref 65–99)
Potassium: 4.3 mmol/L (ref 3.5–5.1)
Sodium: 138 mmol/L (ref 135–145)
TOTAL PROTEIN: 7 g/dL (ref 6.5–8.1)

## 2017-09-10 LAB — URINALYSIS, ROUTINE W REFLEX MICROSCOPIC
Bacteria, UA: NONE SEEN
GLUCOSE, UA: 50 mg/dL — AB
HGB URINE DIPSTICK: NEGATIVE
Ketones, ur: 5 mg/dL — AB
LEUKOCYTES UA: NEGATIVE
NITRITE: NEGATIVE
PROTEIN: 100 mg/dL — AB
SPECIFIC GRAVITY, URINE: 1.03 (ref 1.005–1.030)
pH: 5 (ref 5.0–8.0)

## 2017-09-10 LAB — INFLUENZA PANEL BY PCR (TYPE A & B)
INFLBPCR: NEGATIVE
Influenza A By PCR: NEGATIVE

## 2017-09-10 LAB — I-STAT CG4 LACTIC ACID, ED
LACTIC ACID, VENOUS: 1.92 mmol/L — AB (ref 0.5–1.9)
Lactic Acid, Venous: 2.1 mmol/L (ref 0.5–1.9)

## 2017-09-10 MED ORDER — ONDANSETRON HCL 4 MG/2ML IJ SOLN
4.0000 mg | Freq: Once | INTRAMUSCULAR | Status: AC
  Administered 2017-09-10: 4 mg via INTRAVENOUS
  Filled 2017-09-10: qty 2

## 2017-09-10 MED ORDER — PROMETHAZINE HCL 25 MG RE SUPP: 25.0000 mg | Freq: Three times a day (TID) | RECTAL | 0 refills | Status: DC | PRN

## 2017-09-10 MED ORDER — SODIUM CHLORIDE 0.9 % IV BOLUS (SEPSIS)
1000.0000 mL | Freq: Once | INTRAVENOUS | Status: AC
  Administered 2017-09-10: 1000 mL via INTRAVENOUS

## 2017-09-10 NOTE — ED Notes (Signed)
Pt is not changed into a gown because she states that she is allergic to polyester and can't wear one.

## 2017-09-10 NOTE — Telephone Encounter (Signed)
CXR- There is an area of probable mild pneumonia. Illness like this could be associated with generally feeling bad, including stomach upset and diarrhea. Suggest something like Pepto-Bismol if needed. Take the antibiotic about 30 minutes before you take PB, so it has time to be absorbed.

## 2017-09-10 NOTE — Telephone Encounter (Signed)
Called and spoke with pt. Pt states she developed Diarrhea, vomiting, sweats & chills last night. Pt feels that she has fever but has not measured temp.   Denies body aches.  Pt states symptoms started on 2/5 and worsen over the night last night.  Pt would like to know if symptoms are related to PNA.  CY please advise. Thanks  Current Outpatient Medications on File Prior to Visit  Medication Sig Dispense Refill  . cefdinir (OMNICEF) 300 MG capsule Take 1 capsule (300 mg total) by mouth 2 (two) times daily. 14 capsule 0  . chlorpheniramine-HYDROcodone (TUSSIONEX PENNKINETIC ER) 10-8 MG/5ML SUER Take 5 ml by mouth every 12 hours if needed for cough 200 mL 0  . Cyanocobalamin (VITAMIN B-12 IJ) Inject as directed every 30 (thirty) days.    Marland Kitchen esomeprazole (NEXIUM) 20 MG capsule Take 20 mg by mouth daily at 12 noon.    . ondansetron (ZOFRAN-ODT) 4 MG disintegrating tablet Take 4 mg by mouth 3 (three) times daily as needed for nausea. Sublingual only    . zolpidem (AMBIEN) 10 MG tablet Take 1 tablet (10 mg total) by mouth at bedtime. 30 tablet 5   No current facility-administered medications on file prior to visit.     Allergies  Allergen Reactions  . Sulfonamide Derivatives Anaphylaxis  . Penicillins Rash    Underarms (both) Has patient had a PCN reaction causing immediate rash, facial/tongue/throat swelling, SOB or lightheadedness with hypotension: YES Has patient had a PCN reaction causing severe rash involving mucus membranes or skin necrosis: NO Has patient had a PCN reaction that required hospitalization NO Has patient had a PCN reaction occurring within the last 10 years: NO If all of the above answers are "NO", then may proceed with Cephalosporin use.  Delma Freeze [Fexofenadine]     Heart race  . Avelox [Moxifloxacin]     Doesn't remember  . Caffeine     Heart race  . Cephalexin   . Ciprofloxacin Other (See Comments)    unknown  . Corn-Containing Products Itching and Other (See  Comments)    Can not walk if have a lot of it.  . Crestor [Rosuvastatin]   . Doxycycline Diarrhea and Nausea And Vomiting  . Formoterol Fumarate Other (See Comments)    unknown  . Levofloxacin     REACTION: HEART RACING  . Levofloxacin In D5w Other (See Comments)    Other Reaction: racing heart  . Macrodantin [Nitrofurantoin Macrocrystal] Itching  . Neomycin Sulfate [Neomycin]     Doesn't remember  . Ofloxacin Itching  . Other     Corn fillers, corn by-products - causes severe itching Polyester-"pins sticking in her skin" Gold  . Sulfa Antibiotics   . Wheat Other (See Comments)    Pain in joints  . Adhesive [Tape] Rash  . Ibuprofen Rash  . Mold Extract [Trichophyton Mentagrophyte] Other (See Comments)    Bumps on back, stops up sinuses.  . Nickel Rash  . Tylenol [Acetaminophen] Rash    On face

## 2017-09-10 NOTE — ED Notes (Signed)
BSC placed at bedside for patient to give urine specimen.

## 2017-09-10 NOTE — Telephone Encounter (Signed)
Spoke with patient. She is aware of CY's recs. Nothing else needed at time of call.  
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
## 2017-09-10 NOTE — ED Notes (Signed)
Pt given applesauce and saltine crackers.

## 2017-09-10 NOTE — ED Notes (Signed)
Pt given ice water.

## 2017-09-10 NOTE — ED Notes (Signed)
Patient told EDP that applesauce is not sitting on stomach well so per EDP, patient given [place] sandwich.

## 2017-09-10 NOTE — ED Triage Notes (Signed)
Pt was told yesterday she was starting to get PNA.  Pt states vomiting on 2/5 and then started vomiting again.  Pt has body aches and reports diarrhea has started.

## 2017-09-10 NOTE — ED Notes (Signed)
Patient able to hold liquid and food down.

## 2017-09-12 LAB — ALLERGEN PENICILLIN V (MINOR): CLASS: 0

## 2017-09-12 LAB — ALLERGEN PENICILLIN G IGE
CLASS: 0
PENICILLIN G IGE ALLERGEN: <0.1 kU/L

## 2017-09-16 ENCOUNTER — Other Ambulatory Visit: Payer: Self-pay | Admitting: Internal Medicine

## 2017-09-17 ENCOUNTER — Telehealth: Payer: Self-pay | Admitting: Internal Medicine

## 2017-09-17 NOTE — Telephone Encounter (Signed)
CY Please advise on refill. Thanks.  

## 2017-09-17 NOTE — Telephone Encounter (Signed)
Called and spoke with pt who stated she is still coughing up green mucus.  Pt states she is having diarrhea (5 times so far this morning) and is having regurgitation issues.  Pt states she feels cold and has been having chills and states she might have a mild temp but is not sure due to not taking her temp.  Pt states her symptoms have been happening all week long now.  Dr. [name], please advise on recommendations for pt.  Thanks!  Allergies  Allergen Reactions  . Sulfonamide Derivatives Anaphylaxis  . Penicillins Rash    Underarms (both) Has patient had a PCN reaction causing immediate rash, facial/tongue/throat swelling, SOB or lightheadedness with hypotension: YES Has patient had a PCN reaction causing severe rash involving mucus membranes or skin necrosis: NO Has patient had a PCN reaction that required hospitalization NO Has patient had a PCN reaction occurring within the last 10 years: NO If all of the above answers are "NO", then may proceed with Cephalosporin use.  Delma Freeze [Fexofenadine]     Heart race  . Avelox [Moxifloxacin]     Doesn't remember  . Caffeine     Heart race  . Cephalexin   . Ciprofloxacin Other (See Comments)    unknown  . Corn-Containing Products Itching and Other (See Comments)    Can not walk if have a lot of it.  . Crestor [Rosuvastatin]   . Doxycycline Diarrhea and Nausea And Vomiting  . Formoterol Fumarate Other (See Comments)    unknown  . Levofloxacin     REACTION: HEART RACING  . Levofloxacin In D5w Other (See Comments)    Other Reaction: racing heart  . Macrodantin [Nitrofurantoin Macrocrystal] Itching  . Neomycin Sulfate [Neomycin]     Doesn't remember  . Ofloxacin Itching  . Other     Corn fillers, corn by-products - causes severe itching Polyester-"pins sticking in her skin" Gold  . Sulfa Antibiotics   . Wheat Other (See Comments)    Pain in joints  . Adhesive [Tape] Rash  . Ibuprofen Rash  . Mold Extract [Trichophyton  Mentagrophyte] Other (See Comments)    Bumps on back, stops up sinuses.  . Nickel Rash  . Tylenol [Acetaminophen] Rash    On face     Current Outpatient Medications:  .  bismuth subsalicylate (PEPTO BISMOL) 262 MG/15ML suspension, Take 30 mLs by mouth every 6 (six) hours as needed., Disp: , Rfl:  .  cefdinir (OMNICEF) 300 MG capsule, Take 1 capsule (300 mg total) by mouth 2 (two) times daily., Disp: 14 capsule, Rfl: 0 .  chlorpheniramine-HYDROcodone (TUSSIONEX PENNKINETIC ER) 10-8 MG/5ML SUER, Take 5 ml by mouth every 12 hours if needed for cough, Disp: 200 mL, Rfl: 0 .  Cyanocobalamin (VITAMIN B-12 IJ), Inject as directed every 30 (thirty) days., Disp: , Rfl:  .  diphenhydrAMINE (BENADRYL) 25 mg capsule, Take 25 mg by mouth every 6 (six) hours as needed., Disp: , Rfl:  .  esomeprazole (NEXIUM) 20 MG capsule, Take 20 mg by mouth daily at 12 noon., Disp: , Rfl:  .  ondansetron (ZOFRAN-ODT) 4 MG disintegrating tablet, Take 4 mg by mouth 3 (three) times daily as needed for nausea. Sublingual only, Disp: , Rfl:  .  promethazine (PHENERGAN) 25 MG suppository, Place 1 suppository (25 mg total) rectally every 8 (eight) hours as needed for up to 5 days for nausea or vomiting., Disp: 15 each, Rfl: 0 .  zolpidem (AMBIEN) 10 MG tablet, Take 1 tablet (10  mg total) by mouth at bedtime., Disp: 30 tablet, Rfl: 5

## 2017-09-17 NOTE — Telephone Encounter (Signed)
ATC pt, no answer. Left message for pt to call back.  

## 2017-09-17 NOTE — Telephone Encounter (Signed)
I can't admit her. Her best approach would be to go to the ER to assess dehydration and get IV fluids/ check labs if they feel it appropriate.

## 2017-09-17 NOTE — Telephone Encounter (Signed)
Spoke with patient. She is aware of CY's recs. Nothing else needed at time of call.  

## 2017-09-17 NOTE — Telephone Encounter (Signed)
Ok to refill 

## 2017-09-17 NOTE — Telephone Encounter (Signed)
Order- out-patient labs-   CBC w diff, BMET, venous lactic acid         Dx community acquired pneumonia                                           Sputum culture- routine C&S  While we wait on lab results, recommend bland diet, stay well-hydrated.

## 2017-09-17 NOTE — Telephone Encounter (Signed)
Pt is returning call. Cb is [phone].

## 2017-09-17 NOTE — Telephone Encounter (Addendum)
Pt is aware of below message and voiced her understanding. Pt is requesting prednisone tab or injection.  Pt also states she had a ED visit on 09/10/17. Pt request that CY review lab and notes from ED visit. Pt feels that she is dehydrated and is considering going to the ED. Pt states that she is unable keep food or medication down. Pt states today she has been able to keep some fluids down and a pack of cracker.  Pt wanted to know if it would be possible for CY to admit her until she her symptoms have improved.  Labs will be ordering after receiving CY's response.   CY please advise.

## 2017-09-18 ENCOUNTER — Other Ambulatory Visit: Payer: Medicare Other

## 2017-09-18 NOTE — Telephone Encounter (Signed)
Received call from Cassie in the lab Pt down there now for labs but no orders are placed  While on the phone with Cassie and attempting to order labs, pt became agitated that she was having to wait.  Patient left the lab stating that she has already had labs last week and does not understand why she needs them again.  Cassie attempted to explain to patient that we were taking care of the order but patient refused to stay.  Will sign and forward to CY to make him aware

## 2017-09-18 NOTE — Telephone Encounter (Signed)
Noted  

## 2017-09-20 ENCOUNTER — Encounter (HOSPITAL_COMMUNITY): Payer: Self-pay

## 2017-09-20 ENCOUNTER — Other Ambulatory Visit: Payer: Self-pay

## 2017-09-20 ENCOUNTER — Emergency Department (HOSPITAL_COMMUNITY): Payer: Medicare Other

## 2017-09-20 ENCOUNTER — Emergency Department (HOSPITAL_COMMUNITY)
Admission: EM | Admit: 2017-09-20 | Discharge: 2017-09-20 | Disposition: A | Discharge status: Home / Self Care | Payer: Medicare Other | Attending: Emergency Medicine | Admitting: Emergency Medicine

## 2017-09-20 DIAGNOSIS — R197 Diarrhea, unspecified: Secondary | ICD-10-CM | POA: Diagnosis not present

## 2017-09-20 DIAGNOSIS — R1084 Generalized abdominal pain: Secondary | ICD-10-CM | POA: Insufficient documentation

## 2017-09-20 DIAGNOSIS — R6883 Chills (without fever): Secondary | ICD-10-CM | POA: Diagnosis not present

## 2017-09-20 DIAGNOSIS — R112 Nausea with vomiting, unspecified: Secondary | ICD-10-CM | POA: Diagnosis not present

## 2017-09-20 LAB — C DIFFICILE QUICK SCREEN W PCR REFLEX
C DIFFICILE (CDIFF) INTERP: NOT DETECTED
C DIFFICILE (CDIFF) TOXIN: NEGATIVE
C Diff antigen: NEGATIVE

## 2017-09-20 LAB — COMPREHENSIVE METABOLIC PANEL
ALT: 18 U/L (ref 14–54)
ANION GAP: 12 (ref 5–15)
AST: 27 U/L (ref 15–41)
Albumin: 3.8 g/dL (ref 3.5–5.0)
Alkaline Phosphatase: 63 U/L (ref 38–126)
BILIRUBIN TOTAL: 0.9 mg/dL (ref 0.3–1.2)
BUN: 10 mg/dL (ref 6–20)
CHLORIDE: 106 mmol/L (ref 101–111)
CO2: 25 mmol/L (ref 22–32)
Calcium: 9.1 mg/dL (ref 8.9–10.3)
Creatinine, Ser: 0.92 mg/dL (ref 0.44–1.00)
GFR calc non Af Amer: >60 mL/min (ref >60)
Glucose, Bld: 97 mg/dL (ref 65–99)
POTASSIUM: 3.1 mmol/L — AB (ref 3.5–5.1)
Sodium: 143 mmol/L (ref 135–145)
TOTAL PROTEIN: 6.9 g/dL (ref 6.5–8.1)

## 2017-09-20 LAB — LIPASE, BLOOD: LIPASE: 28 U/L (ref 11–51)

## 2017-09-20 LAB — URINALYSIS, ROUTINE W REFLEX MICROSCOPIC
GLUCOSE, UA: NEGATIVE mg/dL
Hgb urine dipstick: NEGATIVE
Ketones, ur: 40 mg/dL — AB
LEUKOCYTES UA: NEGATIVE
NITRITE: NEGATIVE
PH: 6 (ref 5.0–8.0)
Specific Gravity, Urine: 1.025 (ref 1.005–1.030)

## 2017-09-20 LAB — URINALYSIS, MICROSCOPIC (REFLEX)

## 2017-09-20 LAB — CBC
HEMATOCRIT: 47.5 % — AB (ref 36.0–46.0)
HEMOGLOBIN: 16.6 g/dL — AB (ref 12.0–15.0)
MCH: 31.1 pg (ref 26.0–34.0)
MCHC: 34.9 g/dL (ref 30.0–36.0)
MCV: 89 fL (ref 78.0–100.0)
Platelets: 288 10*3/uL (ref 150–400)
RBC: 5.34 MIL/uL — AB (ref 3.87–5.11)
RDW: 14.4 % (ref 11.5–15.5)
WBC: 15.4 10*3/uL — ABNORMAL HIGH (ref 4.0–10.5)

## 2017-09-20 MED ORDER — ONDANSETRON HCL 4 MG/2ML IJ SOLN
4.0000 mg | Freq: Once | INTRAMUSCULAR | Status: AC
  Administered 2017-09-20: 4 mg via INTRAVENOUS
  Filled 2017-09-20: qty 2

## 2017-09-20 MED ORDER — SODIUM CHLORIDE 0.9 % IV BOLUS (SEPSIS)
1000.0000 mL | Freq: Once | INTRAVENOUS | Status: AC
  Administered 2017-09-20: 1000 mL via INTRAVENOUS

## 2017-09-20 MED ORDER — POTASSIUM CHLORIDE CRYS ER 20 MEQ PO TBCR
20.0000 meq | EXTENDED_RELEASE_TABLET | Freq: Once | ORAL | Status: AC
  Administered 2017-09-20: 20 meq via ORAL
  Filled 2017-09-20: qty 1

## 2017-09-20 MED ORDER — ONDANSETRON HCL 4 MG PO TABS: 4.0000 mg | ORAL_TABLET | Freq: Three times a day (TID) | ORAL | 0 refills | Status: DC | PRN

## 2017-09-20 MED ORDER — ALBUTEROL SULFATE (2.5 MG/3ML) 0.083% IN NEBU
5.0000 mg | INHALATION_SOLUTION | Freq: Once | RESPIRATORY_TRACT | Status: AC
  Administered 2017-09-20: 5 mg via RESPIRATORY_TRACT
  Filled 2017-09-20: qty 6

## 2017-09-20 NOTE — ED Triage Notes (Signed)
Pt brought in via [facility]. Pt comes from home. Pt is AOx4, Pt can and does ambulate w/o assistance. Pt called 911 due to N/V/D that has started at 1000 this morning. Pt states she goes through theses "spells" where pt has N/V/D for 8-12 hours. This is the 6th spell that pt has since the beginning of this year.
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
## 2017-09-20 NOTE — Discharge Instructions (Signed)
Take Zofran as directed for nausea.  As we discussed, you need to follow-up with your GI doctor.  Call their office and arrange for an appointment.  Return the emergency department for any fever, vomiting, difficulty breathing, persistent diarrhea, blood in your stools, inability to eat or drink anything or any other worsening or concerning symptoms.

## 2017-09-20 NOTE — ED Notes (Signed)
Pt is still in restroom at this time. There is now a bedside commode in the room. RN aware.

## 2017-09-20 NOTE — ED Notes (Signed)
Pt is still having diarrhea and has had several bouts since arrival the most recent one right now. Pt has been using bedside commode. Checked on Patient and asked her if she has been nauseous, Pt stated that she has not but she has been telling tech that she was. Pt has 2 water bottles by bedside brought in from outside. Pt has drank at least half of one of them and keeps having diarrhea.

## 2017-09-20 NOTE — ED Notes (Signed)
Bed: WA21 Expected date:  Expected time:  Means of arrival:  Comments: 72 yo N/V/D

## 2017-09-20 NOTE — ED Notes (Signed)
Pt is currently in restroom.

## 2017-09-21 ENCOUNTER — Telehealth: Payer: Self-pay | Admitting: Internal Medicine

## 2017-09-21 NOTE — Telephone Encounter (Signed)
Pt states she was told to follow-up with [name] from her ER visit yesterday. States she has not had anymore nausea and vomiting but the diarrhea has continued. States she has had diarrhea for 6 days now and does not know what to do but knows something has to be wrong. Cdiff was negative in the er. Asked pt if she was taking anything for the diarrhea and she states she is taking pepto bismol, has not taken any imodium yet even though the ER recommended this. Asked pt if she had followed up with her pcp regarding antidepressant, pt states she has but is not taking anything because she has a corn filler allergy. No one can tell her if the antidepressants have corn in them or not so she will not try anything. Please advise.

## 2017-09-21 NOTE — Telephone Encounter (Signed)
Patient states she was seen in the ED yesterday 2.18.19 for vomiting and diarrhea and wants to know what she should do to help with this since Dr.Perry next ov is April. Pt was just seen 2.6.19

## 2017-09-21 NOTE — Telephone Encounter (Signed)
Spoke with pt and she is aware of Dr. Perry's recommendations. 

## 2017-09-21 NOTE — Telephone Encounter (Signed)
My best assessment is that the significant number of her complaints are functional and likely related to depression including poor appetite with associated weight loss. She should follow-up with her PCP as previously recommended. I suspect that once this issue is adequately addressed, number of her GI complaints will improve. OTC antidiarrheals are reasonable in the interim

## 2017-10-20 ENCOUNTER — Ambulatory Visit: Payer: Medicare Other | Admitting: Neurology

## 2017-10-20 ENCOUNTER — Other Ambulatory Visit: Payer: Self-pay | Admitting: Gastroenterology

## 2017-10-20 DIAGNOSIS — R1314 Dysphagia, pharyngoesophageal phase: Secondary | ICD-10-CM

## 2017-10-23 ENCOUNTER — Ambulatory Visit
Admission: RE | Admit: 2017-10-23 | Discharge: 2017-10-23 | Disposition: A | Discharge status: Home / Self Care | Payer: Medicare Other | Source: Ambulatory Visit | Attending: Gastroenterology | Admitting: Gastroenterology

## 2017-10-23 DIAGNOSIS — R1314 Dysphagia, pharyngoesophageal phase: Secondary | ICD-10-CM

## 2017-11-12 ENCOUNTER — Inpatient Hospital Stay (HOSPITAL_COMMUNITY)
Admission: EM | Admit: 2017-11-12 | Discharge: 2017-11-16 | DRG: 246 | Disposition: A | Discharge status: Home / Self Care | Payer: Medicare Other | Attending: Cardiovascular Disease | Admitting: Cardiovascular Disease

## 2017-11-12 ENCOUNTER — Encounter (HOSPITAL_COMMUNITY): Payer: Self-pay

## 2017-11-12 ENCOUNTER — Other Ambulatory Visit: Payer: Self-pay

## 2017-11-12 ENCOUNTER — Emergency Department (HOSPITAL_COMMUNITY): Payer: Medicare Other

## 2017-11-12 DIAGNOSIS — Z803 Family history of malignant neoplasm of breast: Secondary | ICD-10-CM

## 2017-11-12 DIAGNOSIS — K219 Gastro-esophageal reflux disease without esophagitis: Secondary | ICD-10-CM | POA: Diagnosis present

## 2017-11-12 DIAGNOSIS — E785 Hyperlipidemia, unspecified: Secondary | ICD-10-CM | POA: Diagnosis present

## 2017-11-12 DIAGNOSIS — K449 Diaphragmatic hernia without obstruction or gangrene: Secondary | ICD-10-CM | POA: Diagnosis present

## 2017-11-12 DIAGNOSIS — I2102 ST elevation (STEMI) myocardial infarction involving left anterior descending coronary artery: Principal | ICD-10-CM

## 2017-11-12 DIAGNOSIS — R7989 Other specified abnormal findings of blood chemistry: Secondary | ICD-10-CM

## 2017-11-12 DIAGNOSIS — Y92234 Operating room of hospital as the place of occurrence of the external cause: Secondary | ICD-10-CM | POA: Diagnosis not present

## 2017-11-12 DIAGNOSIS — I7776 Dissection of artery of upper extremity: Secondary | ICD-10-CM | POA: Diagnosis not present

## 2017-11-12 DIAGNOSIS — Z881 Allergy status to other antibiotic agents status: Secondary | ICD-10-CM

## 2017-11-12 DIAGNOSIS — Z882 Allergy status to sulfonamides status: Secondary | ICD-10-CM

## 2017-11-12 DIAGNOSIS — Z8249 Family history of ischemic heart disease and other diseases of the circulatory system: Secondary | ICD-10-CM

## 2017-11-12 DIAGNOSIS — Z955 Presence of coronary angioplasty implant and graft: Secondary | ICD-10-CM

## 2017-11-12 DIAGNOSIS — R531 Weakness: Secondary | ICD-10-CM | POA: Diagnosis not present

## 2017-11-12 DIAGNOSIS — Z806 Family history of leukemia: Secondary | ICD-10-CM

## 2017-11-12 DIAGNOSIS — I25110 Atherosclerotic heart disease of native coronary artery with unstable angina pectoris: Secondary | ICD-10-CM | POA: Diagnosis present

## 2017-11-12 DIAGNOSIS — K2270 Barrett's esophagus without dysplasia: Secondary | ICD-10-CM | POA: Diagnosis present

## 2017-11-12 DIAGNOSIS — J45909 Unspecified asthma, uncomplicated: Secondary | ICD-10-CM | POA: Diagnosis present

## 2017-11-12 DIAGNOSIS — G8929 Other chronic pain: Secondary | ICD-10-CM | POA: Diagnosis present

## 2017-11-12 DIAGNOSIS — Z90710 Acquired absence of both cervix and uterus: Secondary | ICD-10-CM

## 2017-11-12 DIAGNOSIS — Z880 Allergy status to penicillin: Secondary | ICD-10-CM

## 2017-11-12 DIAGNOSIS — G43909 Migraine, unspecified, not intractable, without status migrainosus: Secondary | ICD-10-CM

## 2017-11-12 DIAGNOSIS — M797 Fibromyalgia: Secondary | ICD-10-CM | POA: Diagnosis present

## 2017-11-12 DIAGNOSIS — Y658 Other specified misadventures during surgical and medical care: Secondary | ICD-10-CM | POA: Diagnosis not present

## 2017-11-12 DIAGNOSIS — Z888 Allergy status to other drugs, medicaments and biological substances status: Secondary | ICD-10-CM

## 2017-11-12 DIAGNOSIS — Z91040 Latex allergy status: Secondary | ICD-10-CM

## 2017-11-12 DIAGNOSIS — I9788 Other intraoperative complications of the circulatory system, not elsewhere classified: Secondary | ICD-10-CM | POA: Diagnosis not present

## 2017-11-12 DIAGNOSIS — I209 Angina pectoris, unspecified: Secondary | ICD-10-CM | POA: Diagnosis present

## 2017-11-12 DIAGNOSIS — S51819A Laceration without foreign body of unspecified forearm, initial encounter: Secondary | ICD-10-CM | POA: Diagnosis not present

## 2017-11-12 DIAGNOSIS — M4120 Other idiopathic scoliosis, site unspecified: Secondary | ICD-10-CM | POA: Diagnosis present

## 2017-11-12 DIAGNOSIS — Z886 Allergy status to analgesic agent status: Secondary | ICD-10-CM

## 2017-11-12 DIAGNOSIS — Z87892 Personal history of anaphylaxis: Secondary | ICD-10-CM

## 2017-11-12 DIAGNOSIS — S5011XA Contusion of right forearm, initial encounter: Secondary | ICD-10-CM | POA: Diagnosis not present

## 2017-11-12 DIAGNOSIS — I252 Old myocardial infarction: Secondary | ICD-10-CM | POA: Diagnosis present

## 2017-11-12 DIAGNOSIS — Z91018 Allergy to other foods: Secondary | ICD-10-CM

## 2017-11-12 DIAGNOSIS — R778 Other specified abnormalities of plasma proteins: Secondary | ICD-10-CM

## 2017-11-12 LAB — COMPREHENSIVE METABOLIC PANEL
ALK PHOS: 62 U/L (ref 38–126)
ALT: 16 U/L (ref 14–54)
ANION GAP: 11 (ref 5–15)
AST: 24 U/L (ref 15–41)
Albumin: 3.8 g/dL (ref 3.5–5.0)
BUN: 23 mg/dL — ABNORMAL HIGH (ref 6–20)
CALCIUM: 9.5 mg/dL (ref 8.9–10.3)
CO2: 20 mmol/L — AB (ref 22–32)
CREATININE: 1.02 mg/dL — AB (ref 0.44–1.00)
Chloride: 106 mmol/L (ref 101–111)
GFR calc Af Amer: >60 mL/min (ref >60)
GFR, EST NON AFRICAN AMERICAN: 54 mL/min — AB (ref >60)
Glucose, Bld: 136 mg/dL — ABNORMAL HIGH (ref 65–99)
Potassium: 4.6 mmol/L (ref 3.5–5.1)
SODIUM: 137 mmol/L (ref 135–145)
Total Bilirubin: 0.5 mg/dL (ref 0.3–1.2)
Total Protein: 6.9 g/dL (ref 6.5–8.1)

## 2017-11-12 LAB — DIFFERENTIAL
BASOS PCT: 0 %
Basophils Absolute: 0 10*3/uL (ref 0.0–0.1)
EOS PCT: 0 %
Eosinophils Absolute: 0 10*3/uL (ref 0.0–0.7)
LYMPHS PCT: 11 %
Lymphs Abs: 1.8 10*3/uL (ref 0.7–4.0)
MONO ABS: 0.8 10*3/uL (ref 0.1–1.0)
MONOS PCT: 5 %
Neutro Abs: 13.9 10*3/uL — ABNORMAL HIGH (ref 1.7–7.7)
Neutrophils Relative %: 84 %

## 2017-11-12 LAB — I-STAT CHEM 8, ED
BUN: 29 mg/dL — ABNORMAL HIGH (ref 6–20)
CALCIUM ION: 1.14 mmol/L — AB (ref 1.15–1.40)
CREATININE: 0.9 mg/dL (ref 0.44–1.00)
Chloride: 106 mmol/L (ref 101–111)
GLUCOSE: 133 mg/dL — AB (ref 65–99)
HCT: 44 % (ref 36.0–46.0)
HEMOGLOBIN: 15 g/dL (ref 12.0–15.0)
POTASSIUM: 4.6 mmol/L (ref 3.5–5.1)
Sodium: 139 mmol/L (ref 135–145)
TCO2: 24 mmol/L (ref 22–32)

## 2017-11-12 LAB — I-STAT TROPONIN, ED: TROPONIN I, POC: 0.09 ng/mL — AB (ref 0.00–0.08)

## 2017-11-12 LAB — CBC
HEMATOCRIT: 43.1 % (ref 36.0–46.0)
Hemoglobin: 14.6 g/dL (ref 12.0–15.0)
MCH: 31.1 pg (ref 26.0–34.0)
MCHC: 33.9 g/dL (ref 30.0–36.0)
MCV: 91.7 fL (ref 78.0–100.0)
PLATELETS: 317 10*3/uL (ref 150–400)
RBC: 4.7 MIL/uL (ref 3.87–5.11)
RDW: 15.5 % (ref 11.5–15.5)
WBC: 16.4 10*3/uL — ABNORMAL HIGH (ref 4.0–10.5)

## 2017-11-12 LAB — PROTIME-INR
INR: 0.94
PROTHROMBIN TIME: 12.5 s (ref 11.4–15.2)

## 2017-11-12 LAB — APTT: aPTT: 32 seconds (ref 24–36)

## 2017-11-12 NOTE — ED Triage Notes (Signed)
Pt comes from PCP office, sudden onset of severe headache this afternoon at 5pm, LSN, pt reports bilateral arm heaviness and generalized weakness, no speech impairment, no neuro deficits. Episode of nausea, Hx of brain aneurysm in 2014. Sent to r/o TIA, or rhabdo

## 2017-11-12 NOTE — ED Notes (Signed)
Pt allergic to polyester, brought own sheets for bed and refused changing into gown.

## 2017-11-13 ENCOUNTER — Encounter (HOSPITAL_COMMUNITY): Payer: Self-pay | Admitting: General Practice

## 2017-11-13 ENCOUNTER — Inpatient Hospital Stay (HOSPITAL_COMMUNITY)
Admission: EM | Disposition: A | Discharge status: Home / Self Care | Payer: Self-pay | Source: Home / Self Care | Attending: Cardiovascular Disease

## 2017-11-13 ENCOUNTER — Observation Stay (HOSPITAL_COMMUNITY): Payer: Medicare Other

## 2017-11-13 DIAGNOSIS — K2270 Barrett's esophagus without dysplasia: Secondary | ICD-10-CM | POA: Diagnosis not present

## 2017-11-13 DIAGNOSIS — Z881 Allergy status to other antibiotic agents status: Secondary | ICD-10-CM | POA: Diagnosis not present

## 2017-11-13 DIAGNOSIS — I9789 Other postprocedural complications and disorders of the circulatory system, not elsewhere classified: Secondary | ICD-10-CM

## 2017-11-13 DIAGNOSIS — I209 Angina pectoris, unspecified: Secondary | ICD-10-CM | POA: Diagnosis present

## 2017-11-13 DIAGNOSIS — Z806 Family history of leukemia: Secondary | ICD-10-CM | POA: Diagnosis not present

## 2017-11-13 DIAGNOSIS — K449 Diaphragmatic hernia without obstruction or gangrene: Secondary | ICD-10-CM | POA: Diagnosis present

## 2017-11-13 DIAGNOSIS — I7776 Dissection of artery of upper extremity: Secondary | ICD-10-CM | POA: Diagnosis not present

## 2017-11-13 DIAGNOSIS — Z8249 Family history of ischemic heart disease and other diseases of the circulatory system: Secondary | ICD-10-CM | POA: Diagnosis not present

## 2017-11-13 DIAGNOSIS — R748 Abnormal levels of other serum enzymes: Secondary | ICD-10-CM | POA: Diagnosis not present

## 2017-11-13 DIAGNOSIS — S5011XA Contusion of right forearm, initial encounter: Secondary | ICD-10-CM | POA: Diagnosis not present

## 2017-11-13 DIAGNOSIS — I219 Acute myocardial infarction, unspecified: Secondary | ICD-10-CM | POA: Insufficient documentation

## 2017-11-13 DIAGNOSIS — I2510 Atherosclerotic heart disease of native coronary artery without angina pectoris: Secondary | ICD-10-CM

## 2017-11-13 DIAGNOSIS — J45909 Unspecified asthma, uncomplicated: Secondary | ICD-10-CM | POA: Diagnosis present

## 2017-11-13 DIAGNOSIS — R079 Chest pain, unspecified: Secondary | ICD-10-CM

## 2017-11-13 DIAGNOSIS — Z91040 Latex allergy status: Secondary | ICD-10-CM | POA: Diagnosis not present

## 2017-11-13 DIAGNOSIS — I200 Unstable angina: Secondary | ICD-10-CM

## 2017-11-13 DIAGNOSIS — Z886 Allergy status to analgesic agent status: Secondary | ICD-10-CM | POA: Diagnosis not present

## 2017-11-13 DIAGNOSIS — Z87892 Personal history of anaphylaxis: Secondary | ICD-10-CM | POA: Diagnosis not present

## 2017-11-13 DIAGNOSIS — M797 Fibromyalgia: Secondary | ICD-10-CM | POA: Diagnosis present

## 2017-11-13 DIAGNOSIS — G8929 Other chronic pain: Secondary | ICD-10-CM | POA: Diagnosis present

## 2017-11-13 DIAGNOSIS — M4120 Other idiopathic scoliosis, site unspecified: Secondary | ICD-10-CM | POA: Diagnosis present

## 2017-11-13 DIAGNOSIS — Z90710 Acquired absence of both cervix and uterus: Secondary | ICD-10-CM | POA: Diagnosis not present

## 2017-11-13 DIAGNOSIS — Z803 Family history of malignant neoplasm of breast: Secondary | ICD-10-CM | POA: Diagnosis not present

## 2017-11-13 DIAGNOSIS — I252 Old myocardial infarction: Secondary | ICD-10-CM | POA: Diagnosis present

## 2017-11-13 DIAGNOSIS — Y92234 Operating room of hospital as the place of occurrence of the external cause: Secondary | ICD-10-CM | POA: Diagnosis not present

## 2017-11-13 DIAGNOSIS — G43909 Migraine, unspecified, not intractable, without status migrainosus: Secondary | ICD-10-CM | POA: Diagnosis present

## 2017-11-13 DIAGNOSIS — R531 Weakness: Secondary | ICD-10-CM | POA: Diagnosis present

## 2017-11-13 DIAGNOSIS — R7989 Other specified abnormal findings of blood chemistry: Secondary | ICD-10-CM

## 2017-11-13 DIAGNOSIS — I25110 Atherosclerotic heart disease of native coronary artery with unstable angina pectoris: Secondary | ICD-10-CM | POA: Diagnosis present

## 2017-11-13 DIAGNOSIS — S51819A Laceration without foreign body of unspecified forearm, initial encounter: Secondary | ICD-10-CM | POA: Diagnosis not present

## 2017-11-13 DIAGNOSIS — Y658 Other specified misadventures during surgical and medical care: Secondary | ICD-10-CM | POA: Diagnosis not present

## 2017-11-13 DIAGNOSIS — R778 Other specified abnormalities of plasma proteins: Secondary | ICD-10-CM

## 2017-11-13 DIAGNOSIS — K219 Gastro-esophageal reflux disease without esophagitis: Secondary | ICD-10-CM | POA: Diagnosis present

## 2017-11-13 DIAGNOSIS — E785 Hyperlipidemia, unspecified: Secondary | ICD-10-CM | POA: Diagnosis present

## 2017-11-13 DIAGNOSIS — I2102 ST elevation (STEMI) myocardial infarction involving left anterior descending coronary artery: Secondary | ICD-10-CM | POA: Diagnosis present

## 2017-11-13 DIAGNOSIS — Z880 Allergy status to penicillin: Secondary | ICD-10-CM | POA: Diagnosis not present

## 2017-11-13 DIAGNOSIS — I9788 Other intraoperative complications of the circulatory system, not elsewhere classified: Secondary | ICD-10-CM | POA: Diagnosis not present

## 2017-11-13 HISTORY — PX: LEFT HEART CATH AND CORONARY ANGIOGRAPHY: CATH118249

## 2017-11-13 HISTORY — PX: CORONARY STENT INTERVENTION: CATH118234

## 2017-11-13 HISTORY — DX: Acute myocardial infarction, unspecified: I21.9

## 2017-11-13 LAB — URINALYSIS, ROUTINE W REFLEX MICROSCOPIC
Bilirubin Urine: NEGATIVE
GLUCOSE, UA: NEGATIVE mg/dL
Hgb urine dipstick: NEGATIVE
Ketones, ur: NEGATIVE mg/dL
Leukocytes, UA: NEGATIVE
NITRITE: NEGATIVE
PROTEIN: NEGATIVE mg/dL
Specific Gravity, Urine: 1.009 (ref 1.005–1.030)
pH: 6 (ref 5.0–8.0)

## 2017-11-13 LAB — CK: Total CK: 67 U/L (ref 38–234)

## 2017-11-13 LAB — INFLUENZA PANEL BY PCR (TYPE A & B)
INFLAPCR: NEGATIVE
INFLBPCR: NEGATIVE

## 2017-11-13 LAB — POCT ACTIVATED CLOTTING TIME
ACTIVATED CLOTTING TIME: 351 s
Activated Clotting Time: 279 seconds

## 2017-11-13 LAB — I-STAT TROPONIN, ED: Troponin i, poc: 0.31 ng/mL (ref 0.00–0.08)

## 2017-11-13 LAB — CBC
HEMATOCRIT: 42.2 % (ref 36.0–46.0)
Hemoglobin: 14 g/dL (ref 12.0–15.0)
MCH: 30.6 pg (ref 26.0–34.0)
MCHC: 33.2 g/dL (ref 30.0–36.0)
MCV: 92.1 fL (ref 78.0–100.0)
Platelets: 282 10*3/uL (ref 150–400)
RBC: 4.58 MIL/uL (ref 3.87–5.11)
RDW: 15.6 % — ABNORMAL HIGH (ref 11.5–15.5)
WBC: 16.6 10*3/uL — AB (ref 4.0–10.5)

## 2017-11-13 LAB — CREATININE, SERUM
Creatinine, Ser: 1 mg/dL (ref 0.44–1.00)
GFR, EST NON AFRICAN AMERICAN: 56 mL/min — AB (ref >60)

## 2017-11-13 LAB — CK TOTAL AND CKMB (NOT AT ARMC)
CK, MB: 11.8 ng/mL — ABNORMAL HIGH (ref 0.5–5.0)
RELATIVE INDEX: INVALID (ref 0.0–2.5)
Total CK: 64 U/L (ref 38–234)

## 2017-11-13 LAB — LIPID PANEL
CHOL/HDL RATIO: 6.3 ratio
Cholesterol: 322 mg/dL — ABNORMAL HIGH (ref 0–200)
HDL: 51 mg/dL (ref >40)
LDL Cholesterol: 250 mg/dL — ABNORMAL HIGH (ref 0–99)
TRIGLYCERIDES: 106 mg/dL (ref <150)
VLDL: 21 mg/dL (ref 0–40)

## 2017-11-13 LAB — TROPONIN I
TROPONIN I: 0.26 ng/mL — AB (ref <0.03)
TROPONIN I: 0.92 ng/mL — AB (ref <0.03)
TROPONIN I: 9.23 ng/mL — AB (ref <0.03)
Troponin I: 6.89 ng/mL (ref <0.03)

## 2017-11-13 SURGERY — LEFT HEART CATH AND CORONARY ANGIOGRAPHY
Anesthesia: LOCAL

## 2017-11-13 MED ORDER — ADENOSINE (DIAGNOSTIC) FOR INTRACORONARY USE
INTRAVENOUS | Status: DC | PRN
  Administered 2017-11-13 (×2): 60 ug via INTRACORONARY
  Administered 2017-11-13 (×2): 30 ug via INTRACORONARY
  Administered 2017-11-13: 60 ug via INTRACORONARY

## 2017-11-13 MED ORDER — NITROGLYCERIN 0.4 MG SL SUBL: 0.4000 mg | SUBLINGUAL_TABLET | SUBLINGUAL | Status: DC | PRN

## 2017-11-13 MED ORDER — TICAGRELOR 90 MG PO TABS
90.0000 mg | ORAL_TABLET | Freq: Two times a day (BID) | ORAL | Status: DC
  Administered 2017-11-13 – 2017-11-16 (×6): 90 mg via ORAL
  Filled 2017-11-13 (×5): qty 1

## 2017-11-13 MED ORDER — IOHEXOL 350 MG/ML SOLN
INTRAVENOUS | Status: DC | PRN
  Administered 2017-11-13: 20 mL via INTRA_ARTERIAL

## 2017-11-13 MED ORDER — LIDOCAINE HCL (PF) 1 % IJ SOLN
INTRAMUSCULAR | Status: DC | PRN
  Administered 2017-11-13: 2 mL via SUBCUTANEOUS

## 2017-11-13 MED ORDER — SODIUM CHLORIDE 0.9% FLUSH: 3.0000 mL | INTRAVENOUS | Status: DC | PRN

## 2017-11-13 MED ORDER — CARVEDILOL 3.125 MG PO TABS
3.1250 mg | ORAL_TABLET | Freq: Two times a day (BID) | ORAL | Status: DC
  Administered 2017-11-13 – 2017-11-16 (×6): 3.125 mg via ORAL
  Filled 2017-11-13 (×6): qty 1

## 2017-11-13 MED ORDER — CANGRELOR BOLUS VIA INFUSION
INTRAVENOUS | Status: DC | PRN
  Administered 2017-11-13: 2268 ug via INTRAVENOUS

## 2017-11-13 MED ORDER — SODIUM CHLORIDE 0.9 % IV SOLN
0.7500 ug/kg/min | INTRAVENOUS | Status: AC
  Administered 2017-11-13: 0.75 ug/kg/min via INTRAVENOUS
  Filled 2017-11-13 (×2): qty 50

## 2017-11-13 MED ORDER — SODIUM CHLORIDE 0.9% FLUSH
3.0000 mL | Freq: Two times a day (BID) | INTRAVENOUS | Status: DC
  Administered 2017-11-14 – 2017-11-16 (×4): 3 mL via INTRAVENOUS

## 2017-11-13 MED ORDER — ASPIRIN EC 325 MG PO TBEC
325.0000 mg | DELAYED_RELEASE_TABLET | Freq: Every day | ORAL | Status: DC
  Filled 2017-11-13: qty 1

## 2017-11-13 MED ORDER — ONDANSETRON HCL 4 MG PO TABS: 4.0000 mg | ORAL_TABLET | Freq: Three times a day (TID) | ORAL | Status: DC | PRN

## 2017-11-13 MED ORDER — TICAGRELOR 90 MG PO TABS
180.0000 mg | ORAL_TABLET | Freq: Once | ORAL | Status: AC
  Administered 2017-11-13: 180 mg via ORAL
  Filled 2017-11-13: qty 2

## 2017-11-13 MED ORDER — ZOLPIDEM TARTRATE 5 MG PO TABS
5.0000 mg | ORAL_TABLET | Freq: Every day | ORAL | Status: DC
  Administered 2017-11-13 – 2017-11-15 (×3): 5 mg via ORAL
  Filled 2017-11-13 (×3): qty 1

## 2017-11-13 MED ORDER — FENTANYL CITRATE (PF) 100 MCG/2ML IJ SOLN
INTRAMUSCULAR | Status: AC
  Filled 2017-11-13: qty 2

## 2017-11-13 MED ORDER — MORPHINE SULFATE (PF) 4 MG/ML IV SOLN
1.0000 mg | Freq: Once | INTRAVENOUS | Status: AC
  Administered 2017-11-13: 1 mg via INTRAVENOUS
  Filled 2017-11-13: qty 1

## 2017-11-13 MED ORDER — SODIUM CHLORIDE 0.9 % IV SOLN: INTRAVENOUS | Status: DC

## 2017-11-13 MED ORDER — MIDAZOLAM HCL 2 MG/2ML IJ SOLN
INTRAMUSCULAR | Status: DC | PRN
  Administered 2017-11-13 (×2): 0.5 mg via INTRAVENOUS

## 2017-11-13 MED ORDER — VERAPAMIL HCL 2.5 MG/ML IV SOLN
INTRAVENOUS | Status: AC
  Filled 2017-11-13: qty 2

## 2017-11-13 MED ORDER — HEPARIN SODIUM (PORCINE) 1000 UNIT/ML IJ SOLN
INTRAMUSCULAR | Status: DC | PRN
  Administered 2017-11-13: 3000 [IU] via INTRAVENOUS
  Administered 2017-11-13: 6000 [IU] via INTRAVENOUS
  Administered 2017-11-13: 4000 [IU] via INTRAVENOUS

## 2017-11-13 MED ORDER — SODIUM CHLORIDE 0.9 % IV SOLN: 250.0000 mL | INTRAVENOUS | Status: DC | PRN

## 2017-11-13 MED ORDER — FENTANYL CITRATE (PF) 100 MCG/2ML IJ SOLN
INTRAMUSCULAR | Status: DC | PRN
  Administered 2017-11-13 (×2): 25 ug via INTRAVENOUS

## 2017-11-13 MED ORDER — ASPIRIN EC 81 MG PO TBEC
81.0000 mg | DELAYED_RELEASE_TABLET | Freq: Every day | ORAL | Status: DC
Start: 2017-11-14 — End: 2017-11-16
  Administered 2017-11-14 – 2017-11-15 (×2): 81 mg via ORAL
  Filled 2017-11-13 (×3): qty 1

## 2017-11-13 MED ORDER — ONDANSETRON HCL 4 MG/2ML IJ SOLN
4.0000 mg | Freq: Once | INTRAMUSCULAR | Status: AC
  Administered 2017-11-13: 4 mg via INTRAVENOUS
  Filled 2017-11-13: qty 2

## 2017-11-13 MED ORDER — HYDROCOD POLST-CPM POLST ER 10-8 MG/5ML PO SUER: 5.0000 mL | Freq: Two times a day (BID) | ORAL | Status: DC | PRN

## 2017-11-13 MED ORDER — HEPARIN SODIUM (PORCINE) 1000 UNIT/ML IJ SOLN
INTRAMUSCULAR | Status: AC
  Filled 2017-11-13: qty 1

## 2017-11-13 MED ORDER — HEPARIN SODIUM (PORCINE) 5000 UNIT/ML IJ SOLN: 5000.0000 [IU] | Freq: Three times a day (TID) | INTRAMUSCULAR | Status: DC

## 2017-11-13 MED ORDER — NITROGLYCERIN 1 MG/10 ML FOR IR/CATH LAB
INTRA_ARTERIAL | Status: AC
  Filled 2017-11-13: qty 10

## 2017-11-13 MED ORDER — HEPARIN (PORCINE) IN NACL 2-0.9 UNIT/ML-% IJ SOLN
INTRAMUSCULAR | Status: AC
  Filled 2017-11-13: qty 1000

## 2017-11-13 MED ORDER — ASPIRIN 81 MG PO CHEW
CHEWABLE_TABLET | ORAL | Status: AC
  Filled 2017-11-13: qty 4

## 2017-11-13 MED ORDER — CANGRELOR TETRASODIUM 50 MG IV SOLR
INTRAVENOUS | Status: AC | PRN
  Administered 2017-11-13: 4 ug/kg/min via INTRAVENOUS

## 2017-11-13 MED ORDER — HEPARIN (PORCINE) IN NACL 2-0.9 UNIT/ML-% IJ SOLN
INTRAMUSCULAR | Status: AC | PRN
  Administered 2017-11-13 (×2): 500 mL

## 2017-11-13 MED ORDER — ASPIRIN 81 MG PO CHEW
324.0000 mg | CHEWABLE_TABLET | Freq: Once | ORAL | Status: AC
  Administered 2017-11-13: 324 mg via ORAL

## 2017-11-13 MED ORDER — LIDOCAINE HCL (PF) 1 % IJ SOLN
INTRAMUSCULAR | Status: AC
  Filled 2017-11-13: qty 30

## 2017-11-13 MED ORDER — HEPARIN SODIUM (PORCINE) 5000 UNIT/ML IJ SOLN
5000.0000 [IU] | Freq: Three times a day (TID) | INTRAMUSCULAR | Status: DC
  Administered 2017-11-13: 5000 [IU] via SUBCUTANEOUS
  Filled 2017-11-13: qty 1

## 2017-11-13 MED ORDER — ONDANSETRON HCL 4 MG/2ML IJ SOLN: 4.0000 mg | Freq: Four times a day (QID) | INTRAMUSCULAR | Status: DC | PRN

## 2017-11-13 MED ORDER — ENSURE ENLIVE PO LIQD
237.0000 mL | Freq: Two times a day (BID) | ORAL | Status: DC
  Administered 2017-11-14 – 2017-11-15 (×4): 237 mL via ORAL

## 2017-11-13 MED ORDER — MIDAZOLAM HCL 2 MG/2ML IJ SOLN
INTRAMUSCULAR | Status: AC
  Filled 2017-11-13: qty 2

## 2017-11-13 MED ORDER — MORPHINE SULFATE (PF) 2 MG/ML IV SOLN
2.0000 mg | Freq: Once | INTRAVENOUS | Status: AC
  Administered 2017-11-13: 2 mg via INTRAVENOUS
  Filled 2017-11-13: qty 1

## 2017-11-13 MED ORDER — VERAPAMIL HCL 2.5 MG/ML IV SOLN
INTRAVENOUS | Status: DC | PRN
  Administered 2017-11-13 (×2): 10 mL via INTRA_ARTERIAL

## 2017-11-13 MED ORDER — LABETALOL HCL 5 MG/ML IV SOLN
10.0000 mg | INTRAVENOUS | Status: AC | PRN
Start: 2017-11-13 — End: 2017-11-13

## 2017-11-13 MED ORDER — NITROGLYCERIN 0.4 MG SL SUBL
SUBLINGUAL_TABLET | SUBLINGUAL | Status: AC
  Administered 2017-11-13: 0.4 mg
  Filled 2017-11-13: qty 1

## 2017-11-13 MED ORDER — SODIUM CHLORIDE 0.9 % IV SOLN
INTRAVENOUS | Status: DC
  Administered 2017-11-13 (×2): via INTRAVENOUS

## 2017-11-13 MED ORDER — HYDRALAZINE HCL 20 MG/ML IJ SOLN: 5.0000 mg | INTRAMUSCULAR | Status: AC | PRN

## 2017-11-13 MED ORDER — IOPAMIDOL (ISOVUE-370) INJECTION 76%
INTRAVENOUS | Status: DC | PRN
  Administered 2017-11-13: 125 mL via INTRA_ARTERIAL

## 2017-11-13 MED ORDER — CANGRELOR TETRASODIUM 50 MG IV SOLR
INTRAVENOUS | Status: AC
  Filled 2017-11-13: qty 50

## 2017-11-13 MED ORDER — BISMUTH SUBSALICYLATE 262 MG/15ML PO SUSP
15.0000 mL | Freq: Four times a day (QID) | ORAL | Status: DC | PRN
  Filled 2017-11-13: qty 118

## 2017-11-13 MED ORDER — PANTOPRAZOLE SODIUM 40 MG PO TBEC
40.0000 mg | DELAYED_RELEASE_TABLET | Freq: Every day | ORAL | Status: DC
  Administered 2017-11-13: 40 mg via ORAL
  Filled 2017-11-13: qty 1

## 2017-11-13 MED ORDER — NITROGLYCERIN 1 MG/10 ML FOR IR/CATH LAB
INTRA_ARTERIAL | Status: DC | PRN
  Administered 2017-11-13 (×3): 200 ug via INTRACORONARY

## 2017-11-13 MED ORDER — IOPAMIDOL (ISOVUE-370) INJECTION 76%
INTRAVENOUS | Status: AC
  Filled 2017-11-13: qty 125

## 2017-11-13 SURGICAL SUPPLY — 24 items
BALLN SAPPHIRE 2.0X12 (BALLOONS) ×2
BALLN SAPPHIRE ~~LOC~~ 3.25X12 (BALLOONS) ×1 IMPLANT
BALLOON SAPPHIRE 2.0X12 (BALLOONS) IMPLANT
BAND CMPR LRG ZPHR (HEMOSTASIS) ×2
BAND ZEPHYR COMPRESS 30 LONG (HEMOSTASIS) ×2 IMPLANT
CATH IMPULSE 5F ANG/FL3.5 (CATHETERS) ×1 IMPLANT
CATH INFINITI 5 FR MPA2 (CATHETERS) ×1 IMPLANT
CATH LAUNCHER 5F EBU3.0 (CATHETERS) IMPLANT
CATH LAUNCHER 6FR EBU3.5 (CATHETERS) ×1 IMPLANT
CATHETER LAUNCHER 5F EBU3.0 (CATHETERS) ×2
DEVICE RAD COMP TR BAND LRG (VASCULAR PRODUCTS) ×2 IMPLANT
ELECT DEFIB PAD ADLT CADENCE (PAD) ×1 IMPLANT
GUIDEWIRE INQWIRE 1.5J.035X260 (WIRE) IMPLANT
INQWIRE 1.5J .035X260CM (WIRE) ×2
KIT ENCORE 26 ADVANTAGE (KITS) ×1 IMPLANT
KIT HEART LEFT (KITS) ×2 IMPLANT
NDL PERC 21GX4CM (NEEDLE) IMPLANT
NEEDLE PERC 21GX4CM (NEEDLE) ×2 IMPLANT
PACK CARDIAC CATHETERIZATION (CUSTOM PROCEDURE TRAY) ×2 IMPLANT
SHEATH RAIN RADIAL 21G 6FR (SHEATH) ×1 IMPLANT
STENT SIERRA 2.75 X 23 MM (Permanent Stent) ×1 IMPLANT
TRANSDUCER W/STOPCOCK (MISCELLANEOUS) ×2 IMPLANT
TUBING CIL FLEX 10 FLL-RA (TUBING) ×2 IMPLANT
WIRE RUNTHROUGH .014X180CM (WIRE) ×1 IMPLANT

## 2017-11-13 NOTE — Progress Notes (Signed)
Cardiology to take over care. Will remove from team  I would like to thank cardiology for their assistance in this case.  Should any new problems arise please do not hesitate to call.  Will sign off  Velvet Bathe, MD

## 2017-11-13 NOTE — Progress Notes (Signed)
1000 pt called out to nursing station c/o CP, secretary called me and stated that pt stated she was having CP, I went into pt room stated she felt like her mid chest was squeezing. Paged Dr. Wendee Beavers and also a Midlevel called me from Cardiology in reference to another pt I asked if she had pt on her list to see she stated she did, Dr. Acie Fredrickson was in room with pt in matter of minutes, spoke with Dr. Wendee Beavers and told him cardiology would was seeing pt at this time. Dr. Acie Fredrickson wanted pt to have 1 nitroglycerin SL and EKG was performed at this time. Dr. Acie Fredrickson determined pt was having a STEMI he called cath lab to get pt down for procedure and I also paged Dr. Wendee Beavers to inform him of what was going on at this time. Also gave pt 324 ASA chewable per verbal order of Dr. Acie Fredrickson, pt RN coordinator and cath lab assistant came to transport pt to Cath lab we placed pt on zoll and report was given to [facility].
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
## 2017-11-13 NOTE — Brief Op Note (Signed)
BRIEF CARDIAC CATHETERIZATION NOTE  DATE: 11/13/2017 TIME: 12:44 PM  PATIENT:  Donna Bailey  72 y.o. female  PRE-OPERATIVE DIAGNOSIS:  STEMI  POST-OPERATIVE DIAGNOSIS:  Same  PROCEDURE:  Procedure(s): LEFT HEART CATH AND CORONARY ANGIOGRAPHY (N/A) CORONARY STENT INTERVENTION (N/A)  SURGEON:  Surgeon(s) and Role:    * Ezekial Arns, Harrell Gave, MD - Primary  FINDINGS: 1. Significant 2-vessel coronary artery disease, including thrombotic occlusion of mid LAD and 90% stenosis of ostial LCx. 2. Mildly elevated LVEDP. 3. Successful PCI to mid LAD with placement of Xience Sierra 2.75 x 23 mm drug-eluting stent with 0% residual stenosis and TIMI-2 flow due to slow reflow.  Patient was chest pain free following procedure. 4. 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  RECOMMENDATIONS: 1. Transfer to ICU. 2. Continue cangrelor until antiplatelet therapy can be clarified due to multiple allergies.  Recommend loading with ticagrelor 180 mg and stopping cangrelor 2 hours later.  Ticagrelor should be redosed tonight.  Transition to clopidogrel tomorrow with 300 mg daily x 2, followed by 75 mg daily thereafter. 3. Indefinite aspirin 81 mg daily. 4. Aggressive secondary prevention.  Given history of severe statin intolerance, consider outpatient evaluation for PCSK9 inhibitor therapy. 5. Obtain transthoracic echocardiogram to evaluate LVEF. 6. Medical therapy of 90% ostial LCx stenosis.  If she is symptomatic after recovery from anterior MI and she is able to reliably take dual antiplatelet therapy, PCI could be considered.  Nelva Bush, MD [facility] Pager: [phone]
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
## 2017-11-13 NOTE — ED Notes (Signed)
Patient transported to CT 

## 2017-11-13 NOTE — ED Notes (Signed)
ED Provider at bedside. 

## 2017-11-13 NOTE — Progress Notes (Addendum)
[facility] Post-Catheterization Note  Date: 11/13/17 Time: 5:50 PM  Subjective: Donna Bailey reports pain/tenderness at site of right radial arteriotomy dressing.  No hand pain; paresthesia in right hand improved.  No chest pain or shortness of breath.  She is bothered by BP cuff on left arm, which she says is too large.  Donna Bailey has received aspirin and ticagrelor and has yet to develop itching.  Objective: Temp:  [97.6 F (36.4 C)-98.1 F (36.7 C)] 97.6 F (36.4 C) (04/12 1330) Pulse Rate:  [0-79] 63 (04/12 1430) Resp:  [0-64] 16 (04/12 1430) BP: (113-174)/(61-91) 137/83 (04/12 1430) SpO2:  [0 %-100 %] 98 % (04/12 1430) Weight:  [166 lb 11.2 oz (75.6 kg)] 166 lb 11.2 oz (75.6 kg) (04/12 0403) Gen: NAD Right arm: Extensive ecchymosis and soft tissue edema involving the mid and distal 2/3 of the right forearm.  Minimal oozing noted on gauze overlying right radial arteriotomy.  Radial pulse is 2+ proximal and 1+ distal to the arteriotomy site.  The hand is swollen with bluish discoloration, improved compared with before TR band removal.  Capillary refill and fine touch sensation intact.  Skin tear on volar forearm is cover with a clean dressing.  Assessment/plan: 72 y/o woman admitted with chest pain, found to have worsening angina this AM and septal ST elevations.  She underwent emergent left heart catheterization and PCI to occluded mid LAD.  Procedure was complicated by right radial artery dissection and forearm hematoma.  Her right forearm is swollen and bruised but appears neurovascularly intact.  Vascular surgery has been following and recommends conservative measures.  The patient does not wish to have an ACE wrap or Coban applied out of concern for allergy/intolerance.  I encouraged her to elevate her arm.  From a heart standpoint, Donna Bailey is doing well following PCI.  Echo is pending.  Carvedilol 3.125 mg BID ordered.  Pt is intolerant of statins.  I anticipate close observation  over the weekend.  If she tolerates ticagrelor and ASA, she should remain on these.  Alternatively, if itching develops, she should be transitioned to corn-free aspirin and clopidogrel that have been ordered from [facility] and will hopefully arrive on Monday (4/15).  Nelva Bush, MD [facility] Pager: [phone]  Addendum (11/13/17 @ 8:47 PM) Will hold subcutaneous heparin for DVT prophylaxis tonight; will need to be readdressed tomorrow based on stability of right forearm hematoma.  Nelva Bush, MD [facility]
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
## 2017-11-13 NOTE — Progress Notes (Signed)
Pharmacy Consult Note  Patient with corn allergy and special indication for compounded antiplatelet agents.   Have discussed plan with Nahser, End, and patient.   Clopidogrel and aspirin (corn free) has been ordered from [facility] in [place].  AttnRonalee Belts PharmD [phone]  I discussed with their technician to call our main pharmacy number when prescription available (Monday) and we will send carrier to retrieve medication and have available for inpatient use and at patient discharge.   Patient loaded with brilinta yesterday and appears to be tolerating ok, will continue to follow along.   Erin Hearing PharmD., BCPS Clinical Pharmacist 11/14/2017 9:55 AM

## 2017-11-13 NOTE — Progress Notes (Signed)
Initial Nutrition Assessment  DOCUMENTATION CODES:   Not applicable  INTERVENTION:   Ensure Enlive po BID, each supplement provides 350 kcal and 20 grams of protein  NUTRITION DIAGNOSIS:   Increased nutrient needs related to post-op healing as evidenced by estimated needs  GOAL:   Patient will meet greater than or equal to 90% of their needs  MONITOR:   PO intake, Supplement acceptance, Weight trends, I & O's  REASON FOR ASSESSMENT:   Malnutrition Screening Tool    ASSESSMENT:   Pt with PMH of GERD, fibromyalgia, barrett's esophagus, anemia, and insomnia presents with weakness and chest pain. Pt is s/p L heart cath and coronary angiography with stent placement   Unable to see patient at time of visit. Per chart review, pt has been declining in weight over the past year. If weight recordings are accurate, pt is down 33 lbs over the past 10 months, a total of 17% weight loss. No meal completion records available for this admission.   Labs and medications reviewed   NUTRITION - FOCUSED PHYSICAL EXAM:  Unable to assess at this time  Diet Order:  Diet Heart Room service appropriate? Yes; Fluid consistency: Thin  EDUCATION NEEDS:   Not appropriate for education at this time  Skin:  Skin Assessment: Reviewed RN Assessment  Last BM:  11/12/17  Height:   Ht Readings from Last 1 Encounters:  11/13/17 5\' 6"  (1.676 m)   Weight:   Wt Readings from Last 1 Encounters:  11/13/17 166 lb 11.2 oz (75.6 kg)   Ideal Body Weight:  59.1 kg  BMI:  Body mass index is 26.91 kg/m.  Estimated Nutritional Needs:   Kcal:  1900-2100  Protein:  90-105 grams  Fluid:  >/= 1.9 L/d  [place] Ranger, MS, RDN, LDN 11/13/2017 3:59 PM

## 2017-11-13 NOTE — Progress Notes (Signed)
Called to check right radial site. Dressing soaked with blood. Dressing removed slight slow oozing of blood present. Hematoma present proximal and distal to arterial site.  Manual pressure applied for 15 minutes. Dr. Donzetta Matters in to see site. Recommended a coban or elastic dressing. Patient refused elastic dressing due to  Concerns about her polyester allergy. Site redressed with gauze and tegaderm. Spo2 as measured in right thumb is 97%. Capillary refill  1 second in right hand nail beds Dr. Saunders Revel to be informed. Arm currently elevated on towels.

## 2017-11-13 NOTE — Progress Notes (Signed)
Saw patient after procedure.  Appreciate care of heart team.  Occluded mid LAD.    Currently patient pain free having bluish discoloration of right hand with swelling and access site issues.  Have asked Dr. Saunders Revel to evaluate.   Kerry Hough MD [facility]

## 2017-11-14 ENCOUNTER — Inpatient Hospital Stay (HOSPITAL_COMMUNITY): Payer: Medicare Other

## 2017-11-14 DIAGNOSIS — I2102 ST elevation (STEMI) myocardial infarction involving left anterior descending coronary artery: Secondary | ICD-10-CM

## 2017-11-14 LAB — BASIC METABOLIC PANEL
Anion gap: 8 (ref 5–15)
BUN: 19 mg/dL (ref 6–20)
CALCIUM: 8.1 mg/dL — AB (ref 8.9–10.3)
CO2: 20 mmol/L — ABNORMAL LOW (ref 22–32)
Chloride: 109 mmol/L (ref 101–111)
Creatinine, Ser: 0.9 mg/dL (ref 0.44–1.00)
Glucose, Bld: 136 mg/dL — ABNORMAL HIGH (ref 65–99)
Potassium: 3.8 mmol/L (ref 3.5–5.1)
Sodium: 137 mmol/L (ref 135–145)

## 2017-11-14 LAB — CBC
HCT: 34.9 % — ABNORMAL LOW (ref 36.0–46.0)
HEMOGLOBIN: 11.5 g/dL — AB (ref 12.0–15.0)
MCH: 29.9 pg (ref 26.0–34.0)
MCHC: 33 g/dL (ref 30.0–36.0)
MCV: 90.9 fL (ref 78.0–100.0)
Platelets: 244 10*3/uL (ref 150–400)
RBC: 3.84 MIL/uL — AB (ref 3.87–5.11)
RDW: 15.7 % — ABNORMAL HIGH (ref 11.5–15.5)
WBC: 13.9 10*3/uL — ABNORMAL HIGH (ref 4.0–10.5)

## 2017-11-14 LAB — ECHOCARDIOGRAM COMPLETE
Height: 66 in
Weight: 2709.01 oz

## 2017-11-14 LAB — TROPONIN I: TROPONIN I: 7.88 ng/mL — AB (ref <0.03)

## 2017-11-14 LAB — MRSA PCR SCREENING: MRSA BY PCR: NEGATIVE

## 2017-11-14 NOTE — Progress Notes (Signed)
Progress Note   Subjective   Doing well today, the patient denies CP or SOB.   R wrist is stable with moderate ecchymosis and arm swelling.  No new concerns  Inpatient Medications    Scheduled Meds: . aspirin EC  81 mg Oral Daily  . carvedilol  3.125 mg Oral BID WC  . feeding supplement (ENSURE ENLIVE)  237 mL Oral BID BM  . sodium chloride flush  3 mL Intravenous Q12H  . ticagrelor  90 mg Oral BID  . zolpidem  5 mg Oral QHS   Continuous Infusions: . sodium chloride     PRN Meds: sodium chloride, chlorpheniramine-HYDROcodone, nitroGLYCERIN, ondansetron (ZOFRAN) IV, sodium chloride flush   Vital Signs    Vitals:   11/14/17 0600 11/14/17 0700 11/14/17 0758 11/14/17 0800  BP: (!) 90/46 (!) 106/55  112/67  Pulse: 63 (!) 59  60  Resp: 19 15  (!) 25  Temp:   98 F (36.7 C)   TempSrc:   Oral   SpO2: 95% 94%  95%  Weight: 169 lb 5 oz (76.8 kg)     Height:        Intake/Output Summary (Last 24 hours) at 11/14/2017 1119 Last data filed at 11/14/2017 0800 Gross per 24 hour  Intake 240 ml  Output 600 ml  Net -360 ml   Filed Weights   11/13/17 0403 11/14/17 0600  Weight: 166 lb 11.2 oz (75.6 kg) 169 lb 5 oz (76.8 kg)    Telemetry    sinus - Personally Reviewed  Physical Exam   GEN- The patient is fragile appearing, alert and oriented x 3 today. Head- normocephalic, atraumatic Eyes-  Sclera clear, conjunctiva pink Ears- hearing intact Oropharynx- clear Neck- supple, Lungs- Clear to ausculation bilaterally, normal work of breathing Heart- Regular rate and rhythm  GI- soft, NT, ND, + BS Extremities/SKIN/MS- no clubbing, cyanosis, or edema  R wrist has hematoma with extensive swelling of hand, digits, and forearm.  2+ radial/ ulnar pulses with good cap refill and reasonable grip strength Psych- euthymic mood, full affect Neuro- strength and sensation are intact   Labs    Chemistry Recent Labs  Lab 11/12/17 2029 11/12/17 2035 11/13/17 0337 11/14/17 0227    NA 137 139  --  137  K 4.6 4.6  --  3.8  CL 106 106  --  109  CO2 20*  --   --  20*  GLUCOSE 136* 133*  --  136*  BUN 23* 29*  --  19  CREATININE 1.02* 0.90 1.00 0.90  CALCIUM 9.5  --   --  8.1*  PROT 6.9  --   --   --   ALBUMIN 3.8  --   --   --   AST 24  --   --   --   ALT 16  --   --   --   ALKPHOS 62  --   --   --   BILITOT 0.5  --   --   --   GFRNONAA 54*  --  56* >60  GFRAA >60  --  >60 >60  ANIONGAP 11  --   --  8     Hematology Recent Labs  Lab 11/12/17 2029 11/12/17 2035 11/13/17 0337 11/14/17 0227  WBC 16.4*  --  16.6* 13.9*  RBC 4.70  --  4.58 3.84*  HGB 14.6 15.0 14.0 11.5*  HCT 43.1 44.0 42.2 34.9*  MCV 91.7  --  92.1 90.9  MCH 31.1  --  30.6 29.9  MCHC 33.9  --  33.2 33.0  RDW 15.5  --  15.6* 15.7*  PLT 317  --  282 244    Cardiac Enzymes Recent Labs  Lab 11/13/17 0740 11/13/17 1624 11/13/17 2214 11/14/17 0227  TROPONINI 0.92* 6.89* 9.23* 7.88*    Recent Labs  Lab 11/12/17 2033 11/13/17 0047  TROPIPOC 0.09* 0.31*     Patient Profile:   Donna Bailey is a 72 y.o. female with a hx of GERD, fibromyalgia, HLD, OA, Barretts esoph, anemia, insomnia, asthma, who is s/p PCI of mid LAD (100% occluded) for anterior STEMI.  She has had radial artery dissection for which vascular surgery has been following, without intervention required.  She has multiple severe allergies which are also clinically important.    Assessment & Plan    1.  Anterior STEMI S/p PCI 11/13/17 of 100% occluded mid LAD. Clinically improved Consider PCSK9 inhinitor as an outpatient given statin intolerance Low dose coreg started Medical management of 90% ostial LCx as per Dr End.  Could consider stenting down the road if symptoms occur  2. R radial dissection Clinically stable Has been evaluated by vascular surgery Will continue to avoid subQ prophylactic heparin  3. 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  Per Gerald Stabs end's  note yesterday (reviewed), continue ASA and ticagrelor if tolerated.  He also mentions that corn free ASA and plavix have been ordered from [facility] and will be here Monday (4/15) if she does not tolerate current antiplatelet medicines  4. Prophylaxis Early ambulation  Complicated patient.   A high level of decision making was required for this encounter.  Thompson Grayer MD, [facility] 11/14/2017 11:19 AM

## 2017-11-14 NOTE — Progress Notes (Signed)
    Pt has multiple allergies including allergy to corn fillers which are frequently found  in medications  I have called in a prescription for ASA 81 mg a day and Plavix 75 mg a day - both of which are free of corn fillers to:  [place] [facility]  [address] [place], [place]  [address] [phone]     Mertie Moores, MD  11/14/2017 4:56 AM    [address],  [place], [place]  [id number] Pager [phone] Phone: [phone]; Fax: [phone]

## 2017-11-14 NOTE — Progress Notes (Signed)
  Echocardiogram 2D Echocardiogram has been performed.  Merrie Roof F 11/14/2017, 10:37 AM

## 2017-11-14 NOTE — Progress Notes (Signed)
  Progress Note    11/14/2017 10:11 AM 1 Day Post-Op  Subjective: Hand is feeling much better today.  Vitals:   11/14/17 0758 11/14/17 0800  BP:  112/67  Pulse:  60  Resp:  (!) 25  Temp: 98 F (36.7 C)   SpO2:  95%    Physical Exam: Awake alert and oriented x3 Nonlabored respirations Right arm edema is improved although she still has significant hand swelling limiting her mobility Her grip strength is 3 out of 5 at this time She has a palpable radial pulse and a strong palmar arch signal   CBC    Component Value Date/Time   WBC 13.9 (H) 11/14/2017 0227   RBC 3.84 (L) 11/14/2017 0227   HGB 11.5 (L) 11/14/2017 0227   HCT 34.9 (L) 11/14/2017 0227   PLT 244 11/14/2017 0227   MCV 90.9 11/14/2017 0227   MCH 29.9 11/14/2017 0227   MCHC 33.0 11/14/2017 0227   RDW 15.7 (H) 11/14/2017 0227   LYMPHSABS 1.8 11/12/2017 2029   MONOABS 0.8 11/12/2017 2029   EOSABS 0.0 11/12/2017 2029   BASOSABS 0.0 11/12/2017 2029    BMET    Component Value Date/Time   NA 137 11/14/2017 0227   K 3.8 11/14/2017 0227   CL 109 11/14/2017 0227   CO2 20 (L) 11/14/2017 0227   GLUCOSE 136 (H) 11/14/2017 0227   BUN 19 11/14/2017 0227   CREATININE 0.90 11/14/2017 0227   CALCIUM 8.1 (L) 11/14/2017 0227   GFRNONAA >60 11/14/2017 0227   GFRAA >60 11/14/2017 0227    INR    Component Value Date/Time   INR 0.94 11/12/2017 2029     Intake/Output Summary (Last 24 hours) at 11/14/2017 1011 Last data filed at 11/14/2017 0800 Gross per 24 hour  Intake 240 ml  Output 600 ml  Net -360 ml     Assessment:  72 y.o. female is s/p cardiac cath with intervention and subsequent hematoma and bleeding from her right arm access site.  At this time her arm is hemostatic her compartments are soft and her hand is neurologically intact  Plan: No intervention at this time from a vascular standpoint Have recommended elevation as tolerated Gentle compression would be beneficial however she has multiple  allergies that does somewhat limit this.   Phil Corti C. Donzetta Matters, MD [facility] Office: [phone] Pager: [phone]  11/14/2017 10:11 AM

## 2017-11-14 NOTE — Progress Notes (Signed)
Pharmacy Consult Note  Patient with corn allergy and special indication for compounded antiplatelet agents.   [facility] called too late 4/12 to arrange pickup. Will send messenger on Monday morning -opens at [place] PharmD., BCPS Clinical Pharmacist 11/14/2017 9:57 AM

## 2017-11-14 NOTE — Progress Notes (Signed)
CARDIAC REHAB PHASE I   PRE:  Rate/Rhythm: 81 nsr  BP:  Sitting: 139/86      SaO2: 96 ra  MODE:  Ambulation: 740 ft   POST:  Rate/Rhythm: 80 nsr  BP:  Sitting: 154/83     SaO2: 98 ra 1336-1442 Patient ambulated in hallway x 2 assist with gait belt initially related to a vagal episode this am while getting out of bed with her primary RN. Patient described severe allergies to polyester and stated that  CR staff were standing too close to her and our clothes would cause her to have a reaction. Walker obtained during which time patient requested to sit for a moment related to dizziness. Once dizziness passes patient able to ambulate around unit twice with multiple request by RN to "slow down". No contact guard. Steady gait noted. Patient request to walk around unit 3rd time however time would not allow. Education completed including reviewing MI booklet, activity progression, diet, antiplatelet therapy, and phase 2 CR. Unsure if patient will be able to do phase 2 related to the nature of group exercise and the severity of her allergies to clothing of those around her. She stated she was well aware of CR and of MI education because husband had MI and she attended CR and education with him twice. Patient encouraged to walk again this pm. Will follow up on Monday.  Nataliee Shurtz English PayneRN, BSN 11/14/2017 3:21 PM

## 2017-11-15 DIAGNOSIS — E785 Hyperlipidemia, unspecified: Secondary | ICD-10-CM

## 2017-11-15 NOTE — Progress Notes (Addendum)
Called to pt's room with c/o elbow to hand heaviness which is new and similar to previous episode of STEMI however very mild this time.  And indigestion which pt states having for a year now.  Pt stated lasted about a minute.  EKG obtained noted T wave abnormality and prolonged QT. bp 119/59 hr 81.Previous EKG noted same.  Md made aware. Will continue to monitor. Saunders Revel T

## 2017-11-15 NOTE — Care Management Note (Signed)
Case Management Note  Patient Details  Name: Donna Bailey MRN: [id number] Date of Birth: 07-24-1946  Subjective/Objective:    Pt presented for STEMI.  Pt has allergic reaction to many medications bc of undocumented corn fillers used in tablets.  Pt has not had reaction to the asa and Brilinta she has been given here in the hospital.  Pharmacist is trying to arrange for patient to fill medication at the [facility] OP pharmacy so she can get medication from the same lot number, etc..  Pt states she had tried Brilinta before but had allergic reaction because that particular batch was made with corn fillers-  drug companies sometimes use corn fillers and sometimes use potato starch and do not document which.             Pt states her copays range from $7-50 and it will not be a problem for her to pay copay, even if up to $50.   Action/Plan: Brilinta 30 day free card given with copay card.    Expected Discharge Date:                  Expected Discharge Plan:  Home/Self Care  In-House Referral:     Discharge planning Services  CM Consult, Medication Assistance  Post Acute Care Choice:    Choice offered to:     DME Arranged:    DME Agency:     HH Arranged:    HH Agency:     Status of Service:     If discussed at [employer], dates discussed:    Additional Comments:  Claudie Leach, RN 11/15/2017, 11:38 AM

## 2017-11-15 NOTE — Progress Notes (Signed)
Pt independently ambulated  In hallway around the unit 4 times, tolerated well. Returned to room and sat in the chair for dinner.

## 2017-11-15 NOTE — Progress Notes (Signed)
Progress Note  Patient Name: Donna Bailey Date of Encounter: 11/15/2017  Primary Cardiologist: Ezzard Standing, MD   Subjective   She had an episode of elbow heaviness over night.  She is feeling well at this time.  No chest pain or shortness of breath with ambulation.  No pruritis.   Inpatient Medications    Scheduled Meds: . aspirin EC  81 mg Oral Daily  . carvedilol  3.125 mg Oral BID WC  . feeding supplement (ENSURE ENLIVE)  237 mL Oral BID BM  . sodium chloride flush  3 mL Intravenous Q12H  . ticagrelor  90 mg Oral BID  . zolpidem  5 mg Oral QHS   Continuous Infusions: . sodium chloride     PRN Meds: sodium chloride, chlorpheniramine-HYDROcodone, nitroGLYCERIN, ondansetron (ZOFRAN) IV, sodium chloride flush   Vital Signs    Vitals:   11/14/17 2300 11/15/17 0300 11/15/17 0800 11/15/17 1127  BP: (!) 115/58 (!) 142/70 (!) 142/56 (!) 121/53  Pulse: 74 78 70 70  Resp: 18 19 20 19   Temp: 97.7 F (36.5 C) 97.6 F (36.4 C) 98.1 F (36.7 C) 97.7 F (36.5 C)  TempSrc: Oral Oral Oral Oral  SpO2: 98% 97%  100%  Weight:      Height:        Intake/Output Summary (Last 24 hours) at 11/15/2017 1202 Last data filed at 11/15/2017 1100 Gross per 24 hour  Intake 600 ml  Output -  Net 600 ml   Filed Weights   11/13/17 0403 11/14/17 0600  Weight: 166 lb 11.2 oz (75.6 kg) 169 lb 5 oz (76.8 kg)    Telemetry    Sinus rhythm.  No events.  - Personally Reviewed  ECG    11/14/17: Sinus rhythm.  Rate 72 bpm.  Anterior ST elevation and marked TWI consistent with evolving MI.  QTc 490 ms. - Personally Reviewed  Physical Exam   VS:  BP (!) 121/53 (BP Location: Left Arm)   Pulse 70   Temp 97.7 F (36.5 C) (Oral)   Resp 19   Ht 5\' 6"  (1.676 m) Comment: per chart  Wt 169 lb 5 oz (76.8 kg)   SpO2 100%   BMI 27.33 kg/m  , BMI Body mass index is 27.33 kg/m. GENERAL:  Well appearing.  No acute distress.  HEENT: Pupils equal round and reactive, fundi not visualized,  oral mucosa unremarkable NECK:  No jugular venous distention, waveform within normal limits, carotid upstroke brisk and symmetric, no bruits LUNGS:  Clear to auscultation bilaterally HEART:  RRR.  PMI not displaced or sustained,S1 and S2 within normal limits, no S3, no S4, no clicks, no rubs, II/VI systolic murmur ABD:  Flat, positive bowel sounds normal in frequency in pitch, no bruits, no rebound, no guarding, no midline pulsatile mass, no hepatomegaly, no splenomegaly EXT:  2 plus pulses throughout, no edema, no cyanosis no clubbing.  Marked ecchymosis and edema of the R UE SKIN:  No rashes no nodules NEURO:  Cranial nerves II through XII grossly intact, motor grossly intact throughout [facility]:  Cognitively intact, oriented to person place and time   Labs    Chemistry Recent Labs  Lab 11/12/17 2029 11/12/17 2035 11/13/17 0337 11/14/17 0227  NA 137 139  --  137  K 4.6 4.6  --  3.8  CL 106 106  --  109  CO2 20*  --   --  20*  GLUCOSE 136* 133*  --  136*  BUN  23* 29*  --  19  CREATININE 1.02* 0.90 1.00 0.90  CALCIUM 9.5  --   --  8.1*  PROT 6.9  --   --   --   ALBUMIN 3.8  --   --   --   AST 24  --   --   --   ALT 16  --   --   --   ALKPHOS 62  --   --   --   BILITOT 0.5  --   --   --   GFRNONAA 54*  --  56* >60  GFRAA >60  --  >60 >60  ANIONGAP 11  --   --  8     Hematology Recent Labs  Lab 11/12/17 2029 11/12/17 2035 11/13/17 0337 11/14/17 0227  WBC 16.4*  --  16.6* 13.9*  RBC 4.70  --  4.58 3.84*  HGB 14.6 15.0 14.0 11.5*  HCT 43.1 44.0 42.2 34.9*  MCV 91.7  --  92.1 90.9  MCH 31.1  --  30.6 29.9  MCHC 33.9  --  33.2 33.0  RDW 15.5  --  15.6* 15.7*  PLT 317  --  282 244    Cardiac Enzymes Recent Labs  Lab 11/13/17 0740 11/13/17 1624 11/13/17 2214 11/14/17 0227  TROPONINI 0.92* 6.89* 9.23* 7.88*    Recent Labs  Lab 11/12/17 2033 11/13/17 0047  TROPIPOC 0.09* 0.31*     BNPNo results for input(s): BNP, PROBNP in the last 168 hours.   DDimer No  results for input(s): DDIMER in the last 168 hours.   Radiology    No results found.  Cardiac Studies   Echo 11/14/17: Study Conclusions  - Left ventricle: The cavity size was normal. Wall thickness was   normal. Systolic function was mildly to moderately reduced. The   estimated ejection fraction was in the range of 40% to 45%.   Hypokinesis of the apicalanteroseptal, inferior, inferoseptal,   and apical myocardium; consistent with ischemia in the   distribution of the left anterior descending coronary artery.  LHC 11/13/17: Conclusions: 1. Significant 2-vessel coronary artery disease, including acute plaque rupture and 100% thrombotic occlusion of proximal/mid LAD (type I MI), moderate to severe D1 stenoses, and 90% ostial LCx lesion. 2. Mildly elevated LVEDP. 3. Challenging but successful primary PCI to proximal/mid LAD using Xience Sierra 2.75 x 23 mm drug-eluting stent with 0% residual stenosis and TIMI-2 flow due to slow-reflow phenomenon.  Door-to-balloon time was delayed due to discuss regarding patient's multiple allergies/interoleranced (including to antiplatelet therapy), difficult right radial access, and challenging guide catheter engagement. 4. 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 5. Right forearm hematoma from arteriotomy site.  Recommendations: 1. Transfer to 2-Heart ICU for post-STEMI monitoring and frequent neurovascular checks of right forearm. 2. Load with ticagrelor; discontinue cangrelor 2 hours later. 3. If possible, continue aspirin and ticagrelor for at least 12 months.  If patient is intolerant, recommend transitioning to corn-free aspirin and clopidogrel (have been ordered from pharmacy).  Transition should be performed as follows: load with clopidogrel 300 mg daily x 2 when patient would be scheduled for next dose of ticagrelor, then continue clopidogrel 75 mg daily thereafter. 4. Aggressive  medical therapy and secondary prevention; consider starting PCSK9 inhibitor as an outpatient, given statin intolerance.  I will add carvedilol 3.125 mg BID this evening. 5. Obtain echocardiogram to assess LVEF. 6. Medical therapy of 90% ostial LCx stenosis. If she is symptomatic after  recovery from anterior MI and she is able to reliably take dual antiplatelet therapy, PCI could be considered. 7. Vascular surgery consultation for evaluation of right forearm hematoma.  Patient Profile     72 y.o. female with hyperlipidemia, GERD, Barrett's esophagus, fibromyalgia and asthma here with anterior STEMI complicated by radial artery dissection.  Assessment & Plan    # Anterior STEMI: # Hyperlipidemia:  Ms. Bertini underwent PCI for 100% LAD occlusion on 11/13/17. She also has a 90% ostial LCx lesion.  Continue aspirin, ticagrelor and carvedilol.  If she continues to have symptoms and is able to tolerate DAPT then LCx PCI can be considered as an outpatient.   She has not tolerated statins and should be considered for PCSK9 inhibitor as an outpatient.   # R radial artery dissection.  Conservative management per vascular surgery.  No prophylactic heparin.   # Allergies: Patient has corn filler allegery which is used in aspirin and clopidogrel.  Dr. Gerald Stabs end's note (reviewed), continue ASA and ticagrelor if tolerated.  He also mentions that corn free ASA and plavix have been ordered from [facility] and will be here Monday (4/15) if she does not tolerate current antiplatelet medicines.  Thus far she is tolerating the current therapy and we discussed discharge today on the current regimen.  However, she prefers to wait for the corn free formulation to arrive 4/15 prior to discharge.     For questions or updates, please contact [place] Please consult [email or web address] for contact info under Cardiology/STEMI.      Signed, Skeet Latch, MD  11/15/2017, 12:02 PM

## 2017-11-16 ENCOUNTER — Encounter (HOSPITAL_COMMUNITY): Payer: Self-pay | Admitting: Internal Medicine

## 2017-11-16 MED ORDER — ASPIRIN 81 MG PO CHEW: 81.0000 mg | CHEWABLE_TABLET | Freq: Every day | ORAL | 11 refills | Status: DC

## 2017-11-16 MED ORDER — CARVEDILOL 3.125 MG PO TABS: 3.1250 mg | ORAL_TABLET | Freq: Two times a day (BID) | ORAL | 0 refills | Status: DC

## 2017-11-16 MED ORDER — ASPIRIN 81 MG PO CHEW
81.0000 mg | CHEWABLE_TABLET | Freq: Every day | ORAL | Status: DC
  Administered 2017-11-16: 81 mg via ORAL

## 2017-11-16 MED ORDER — NITROGLYCERIN 0.4 MG SL SUBL: 0.4000 mg | SUBLINGUAL_TABLET | SUBLINGUAL | 2 refills | Status: AC | PRN

## 2017-11-16 MED ORDER — TICAGRELOR 90 MG PO TABS: 90.0000 mg | ORAL_TABLET | Freq: Two times a day (BID) | ORAL | 11 refills | Status: DC

## 2017-11-16 MED FILL — NITROGLYCERIN 0.4 MG TAB SL: 0.4 | 7 days supply | Qty: 25 | Fill #0

## 2017-11-16 MED FILL — BRILINTA 90 MG TABS: 90 | 30 days supply | Qty: 60 | Fill #0 | Status: TO

## 2017-11-16 MED FILL — ASPIRIN 81 MG CHEWABLE TAB: 81 | 30 days supply | Qty: 30 | Fill #0

## 2017-11-16 MED FILL — CARVEDILOL 3.125 MG TABLET: 3.125 | 30 days supply | Qty: 60 | Fill #0

## 2017-11-16 NOTE — Progress Notes (Signed)
Removed PIV access on Lt. Arm. Pt received discharge instructions and explained follow up date, explained pt that she can take zofran which verified by PA Brittainy. Pt understood that she need to pick up the medications at the [facility] health out patient pharmacy due to allergy. Changed dressing on Rt. Arm and applied ice bag on that site. Pt took all her belongings. Pt left the floor. HS [employer]

## 2017-11-16 NOTE — Progress Notes (Signed)
Pt sts she walked 5 min independently this morning, felt well. Reviewed ed and answered her questions.  [id number] [place], ACSM 10:19 AM 11/16/2017

## 2017-11-16 NOTE — Discharge Summary (Addendum)
Discharge Summary    Patient ID: Donna Bailey,  MRN: [id number], DOB/AGE: 71-Feb-1947 71 y.o.  Admit date: 11/12/2017 Discharge date: 11/16/2017  Primary Care Provider: Daisy Floro Primary Cardiologist: Georga Hacking, MD  Discharge Diagnoses    Principal Problem:   Chest pain Active Problems:   Barrett esophagus   Hyperlipidemia   Migraine   ST elevation myocardial infarction involving left anterior descending (LAD) coronary artery (HCC)   Elevated troponin   STEMI involving left anterior descending coronary artery (HCC)   Allergies Allergies  Allergen Reactions  . Sulfa Antibiotics Anaphylaxis  . Sulfonamide Derivatives Anaphylaxis  . Penicillins Rash    Underarms (both) Has patient had a PCN reaction causing immediate rash, facial/tongue/throat swelling, SOB or lightheadedness with hypotension: YES Has patient had a PCN reaction causing severe rash involving mucus membranes or skin necrosis: NO Has patient had a PCN reaction that required hospitalization NO Has patient had a PCN reaction occurring within the last 10 years: NO If all of the above answers are "NO", then may proceed with Cephalosporin use.  Joyce Copa [Fexofenadine]     Heart race  . Avelox [Moxifloxacin]     Doesn't remember  . Caffeine     Heart race  . Cephalexin Other (See Comments)    unknown  . Ciprofloxacin Other (See Comments)    unknown  . Corn-Containing Products Itching and Other (See Comments)    Can not walk if have a lot of it.  . Crestor [Rosuvastatin] Other (See Comments)    Lost all muscle mobility   . Doxycycline Diarrhea and Nausea And Vomiting  . Formoterol Fumarate Other (See Comments)    unknown  . Latex     Gets red   . Levofloxacin     REACTION: HEART RACING  . Macrodantin [Nitrofurantoin Macrocrystal] Itching  . Neomycin Sulfate [Neomycin]     Doesn't remember  . Ofloxacin Itching  . Other     Corn fillers, corn by-products - causes severe  itching Polyester-"pins sticking in her skin" Gold  . Wheat Other (See Comments)    Pain in joints  . Adhesive [Tape] Rash  . Ibuprofen Rash  . Mold Extract [Trichophyton Mentagrophyte] Other (See Comments)    Bumps on back, stops up sinuses.  . Nickel Rash  . Tylenol [Acetaminophen] Rash    On face    Diagnostic Studies/Procedures    LHC 11/13/17 Conclusion   Conclusions: 1. Significant 2-vessel coronary artery disease, including acute plaque rupture and 100% thrombotic occlusion of proximal/mid LAD (type I MI), moderate to severe D1 stenoses, and 90% ostial LCx lesion. 2. Mildly elevated LVEDP. 3. Challenging but successful primary PCI to proximal/mid LAD using Xience Sierra 2.75 x 23 mm drug-eluting stent with 0% residual stenosis and TIMI-2 flow due to slow-reflow phenomenon.  Door-to-balloon time was delayed due to discuss regarding patient's multiple allergies/interoleranced (including to antiplatelet therapy), difficult right radial access, and challenging guide catheter engagement. 4. 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 5. Right forearm hematoma from arteriotomy site.   2D Echo 11/14/17 Study Conclusions  - Left ventricle: The cavity size was normal. Wall thickness was   normal. Systolic function was mildly to moderately reduced. The   estimated ejection fraction was in the range of 40% to 45%.   Hypokinesis of the apicalanteroseptal, inferior, inferoseptal,   and apical myocardium; consistent with ischemia in the   distribution of the left anterior descending coronary  artery.   History of Present Illness     Donna Bailey is a 72 y.o. female, followed by Dr. Donnie Aho, with a hx of GERD, fibromyalgia, HLD, OA, Barretts esoph, anemia, insomnia, asthma and multiple allergies/ intolerances (including to antiplatelet medications-- see details below) who presented to the [facility] ED on 11/12/17 with complaints of  chest pain, bilateral arm heaviness and generalized weakness. She initially was admitted by IM and ruled in for MI with elevated troponin's, peaking at 9.23. Cardiology initially consulted for NSTEMI however after admission, she developed worsening CP and new EKG changes with septal ST segment elevations, prompting emergent LHC on 11/13/17 by Dr. Okey Dupre. She was found to have significant 2 vessel CAD, including acute plaque rupture and 100% thrombotic occlusion of the proximal LAD (type I MI), moderate to severe D1 stenosis and 90% ostial LCx lesion. She underwent challenging, but successful primary PCI of the proximal/ mid LAD, using a  Xience Sierra 2.75 x 23 mm drug-eluting stent.  Door-to-balloon time was delayed due to discuss regarding patient's multiple allergies/interoleranced (including to antiplatelet therapy), difficult right radial access, and challenging guide catheter engagement. The LCx was not intervened on. Plans are for medical therapy but PCI may be considered if she has recurrent angina. EF was 40-45%.  Pt left the cath lab in stable condition, but developed right forearm hematoma from the arteriotomy site. Vascular surgery was consulted. She was seen by Dr. Randie Heinz. Her hand was sensory and motor intact. Dr. Randie Heinz did not feel that surgery was indicated, as she appeared well vascularized. There was also no indication for hematoma evacuation. Conservative management was recommended.   As outlined above, pt has multiple allergies/ intolerances (including to antiplatelet medications).  Patient has corn filler allegery which is used in aspirin and clopidogrel. Pt was treated with ASA and Brilinta and did well, but a special order of ASA and Plavix, free of corn filler, was requested from [facility] and sent to [facility] outpatient pharmacy, to be available in the event that pt develops intolerance to Brilinta. Pt also has h/o statin intolerance. LDL was markedly elevated at 250 mg/dL and it has  been recommended that she consider PCSK9 inhibitor therapy. This can be further discussed with Dr. Donnie Aho. She was prescribed BB therapy with Coreg and did well w/o any side effects.   On 11/16/17, pt was last seen and examined by Dr. Antoine Poche and was doing well w/o recurrent CP. No dyspnea. She ambulated with cardiac rehab w/o exertional symptoms. Dr. Antoine Poche felt that she was stable for discharge home. She will f/u with Dr. Donnie Aho.    Hospital Course     Consultants: Vascular Surgery    Discharge Vitals Blood pressure 134/68, pulse 69, temperature (!) 97.5 F (36.4 C), temperature source Oral, resp. rate 18, height 5\' 6"  (1.676 m), weight 169 lb 5 oz (76.8 kg), SpO2 97 %.  Filed Weights   11/13/17 0403 11/14/17 0600  Weight: 166 lb 11.2 oz (75.6 kg) 169 lb 5 oz (76.8 kg)    Labs & Radiologic Studies    CBC Recent Labs    11/14/17 0227  WBC 13.9*  HGB 11.5*  HCT 34.9*  MCV 90.9  PLT 244   Basic Metabolic Panel Recent Labs    20/25/42 0227  NA 137  K 3.8  CL 109  CO2 20*  GLUCOSE 136*  BUN 19  CREATININE 0.90  CALCIUM 8.1*   Liver Function Tests No results for input(s): AST, ALT, ALKPHOS, BILITOT,  PROT, ALBUMIN in the last 72 hours. No results for input(s): LIPASE, AMYLASE in the last 72 hours. Cardiac Enzymes Recent Labs    11/13/17 1624 11/13/17 2214 11/14/17 0227  TROPONINI 6.89* 9.23* 7.88*   BNP Invalid input(s): POCBNP D-Dimer No results for input(s): DDIMER in the last 72 hours. Hemoglobin A1C No results for input(s): HGBA1C in the last 72 hours. Fasting Lipid Panel No results for input(s): CHOL, HDL, LDLCALC, TRIG, CHOLHDL, LDLDIRECT in the last 72 hours. Thyroid Function Tests No results for input(s): TSH, T4TOTAL, T3FREE, THYROIDAB in the last 72 hours.  Invalid input(s): FREET3 _____________  Ct Head Wo Contrast  Result Date: 11/12/2017 CLINICAL DATA:  Headache with upper extremity numbness EXAM: CT HEAD WITHOUT CONTRAST TECHNIQUE:  Contiguous axial images were obtained from the base of the skull through the vertex without intravenous contrast. COMPARISON:  01/21/2017, MRI 08/26/2016 FINDINGS: Brain: No acute territorial infarction, hemorrhage, or new intracranial mass. Calcifications at the left cerebellum corresponding to previously imaged developmental venous anomaly. This finding is unchanged. Mild atrophy. Normal ventricle size. Vascular: No hyperdense vessels.  Carotid vascular calcification. Skull: No fracture or suspicious lesion Sinuses/Orbits: Mucosal thickening in the ethmoid sinuses. No acute orbital abnormality. Other: None IMPRESSION: 1. No CT evidence for acute intracranial abnormality. 2. 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 3. Mild atrophy Electronically Signed   By: Jasmine Pang M.D.   On: 11/12/2017 20:55   Ct Chest Wo Contrast  Result Date: 11/13/2017 CLINICAL DATA:  72 year old female with anemia, fibromyalgia, chronic abdominal pain and headache presents for headache and arm heaviness. Chest pain with normal EKG. EXAM: CT CHEST WITHOUT CONTRAST TECHNIQUE: Multidetector CT imaging of the chest was performed following the standard protocol without IV contrast. COMPARISON:  11/20/2010 CT FINDINGS: Cardiovascular: Normal branch pattern of the great vessels. Minimal thoracic aortic atherosclerosis without aneurysm. Left main and three-vessel coronary arteriosclerosis. No pericardial effusion. Mediastinum/Nodes: Small hiatal hernia. Lymphadenopathy. Patent trachea and mainstem bronchi. No thyromegaly or mass. Lungs/Pleura: Minimal scarring medially in the left upper lobe. Bibasilar dependent atelectasis. Nonspecific coarse calcifications along the lateral aspect of the right upper lobe, adjacent to the minor fissure. Stigmata of old remote granulomatous infection might account for this. Similar punctate calcifications along the left major fissure. Given stability since  2012, this is consistent with a benign finding. No calcific pleural plaque is noted. Upper Abdomen: No acute abnormality. Musculoskeletal: No chest wall mass or suspicious bone lesions identified. IMPRESSION: 1. Chronic stable pulmonary calcifications compatible with old granulomatous disease. 2. No acute pulmonary consolidation, CHF, effusion or pneumothorax. 3. Moderate left main and three-vessel coronary arteriosclerosis. Aortic Atherosclerosis (ICD10-I70.0). Electronically Signed   By: Tollie Eth M.D.   On: 11/13/2017 03:11   Dg Esophagus  Result Date: 10/23/2017 CLINICAL DATA:  72 year old female with dysphagia, frequent feeling of throat or esophagus closing off and inability to consume more after a modest amount of p.o. intake, sometimes only p.o. liquids. Personal history of gastroesophageal reflux, Barrett's esophagus, gastric polyps, hiatal hernia. EXAM: ESOPHOGRAM / BARIUM SWALLOW / BARIUM TABLET STUDY TECHNIQUE: Combined double contrast and single contrast examination performed using effervescent crystals, thick barium liquid, and thin barium liquid. The patient was observed with fluoroscopy swallowing a 13 mm barium sulphate tablet. FLUOROSCOPY TIME:  Fluoroscopy Time:  2 minutes 36 seconds Radiation Exposure Index (if provided by the fluoroscopic device): 45 mGy Number of Acquired Spot Images: 0 COMPARISON:  Upper endoscopy report 06/03/2017. chest radiographs 09/20/2017 and earlier. CT Abdomen  and Pelvis 06/11/2017 FINDINGS: A double contrast study was undertaken and the patient tolerated this well and without difficulty. No obstruction to the forward flow of contrast throughout the esophagus and into the stomach. Normal esophageal course and contour; fairly capacious mid and lower thoracic esophagus (series 2). Esophageal mucosal pattern is within normal limits. Dedicated imaging of the cervical esophagus is normal (e.g. Series 8, image 13). A 12.5 mm barium tablet was administered and passed  freely to the stomach without delay. With prone swallows esophageal motility is normal. Throughout the study a capacious gastroesophageal junction was observed. When the patient was brought supine, sliding gastric hiatal hernia occurred (small to moderate, series 11) along with gastroesophageal reflux to the level of the thoracic inlet. During intermittent fluoroscopic observation the hernia was then seen to spontaneously resolved. Prompt gastric emptying incidentally noted, with contrast in the proximal jejunum at the conclusion of the exam. Evidence of the multiple known gastric body polyps in the proximal body noted at the conclusion of the study. IMPRESSION: 1. The study is positive for gastroesophageal reflux with a sliding-type - up to moderate sized - gastric hiatal hernia. 2. But otherwise normal esophagram; No esophageal stenosis, no discrete esophageal lesion, normal esophageal motility, capacious thoracic esophagus, and normal appearing gastric emptying. 3. Known chronic gastric polyps. Electronically Signed   By: Odessa Fleming M.D.   On: 10/23/2017 10:51   Disposition   Pt is being discharged home today in good condition.  Follow-up Plans & Appointments    Follow-up Information    Othella Boyer, MD Follow up.   Specialty:  Cardiology Contact information: [address] [place] [address] [phone]          Discharge Instructions    Amb Referral to Cardiac Rehabilitation   Complete by:  As directed    Diagnosis:   Coronary Stents STEMI     Call MD for:  severe uncontrolled pain   Complete by:  As directed    Diet - low sodium heart healthy   Complete by:  As directed    Increase activity slowly   Complete by:  As directed    Lifting restrictions   Complete by:  As directed    Limit use of right hand/ forearm. No heavy lifting.      Discharge Medications   Allergies as of 11/16/2017      Reactions   Sulfa Antibiotics Anaphylaxis   Sulfonamide  Derivatives Anaphylaxis   Penicillins Rash   Underarms (both) Has patient had a PCN reaction causing immediate rash, facial/tongue/throat swelling, SOB or lightheadedness with hypotension: YES Has patient had a PCN reaction causing severe rash involving mucus membranes or skin necrosis: NO Has patient had a PCN reaction that required hospitalization NO Has patient had a PCN reaction occurring within the last 10 years: NO If all of the above answers are "NO", then may proceed with Cephalosporin use.   Allegra [fexofenadine]    Heart race   Avelox [moxifloxacin]    Doesn't remember   Caffeine    Heart race   Cephalexin Other (See Comments)   unknown   Ciprofloxacin Other (See Comments)   unknown   Corn-containing Products Itching, Other (See Comments)   Can not walk if have a lot of it.   Crestor [rosuvastatin] Other (See Comments)   Lost all muscle mobility    Doxycycline Diarrhea, Nausea And Vomiting   Formoterol Fumarate Other (See Comments)   unknown   Latex  Gets red    Levofloxacin    REACTION: HEART RACING   Macrodantin [nitrofurantoin Macrocrystal] Itching   Neomycin Sulfate [neomycin]    Doesn't remember   Ofloxacin Itching   Other    Corn fillers, corn by-products - causes severe itching Polyester-"pins sticking in her skin" Gold   Wheat Other (See Comments)   Pain in joints   Adhesive [tape] Rash   Ibuprofen Rash   Mold Extract [trichophyton Mentagrophyte] Other (See Comments)   Bumps on back, stops up sinuses.   Nickel Rash   Tylenol [acetaminophen] Rash   On face      Medication List    STOP taking these medications   chlorpheniramine-HYDROcodone 10-8 MG/5ML Suer Commonly known as:  TUSSIONEX PENNKINETIC ER   ondansetron 4 MG disintegrating tablet Commonly known as:  ZOFRAN-ODT     TAKE these medications   aspirin 81 MG chewable tablet Chew 1 tablet (81 mg total) by mouth daily. Start taking on:  11/17/2017   bismuth subsalicylate 262 MG/15ML  suspension Commonly known as:  PEPTO BISMOL Take 15 mLs by mouth every 6 (six) hours as needed for indigestion or diarrhea or loose stools.   carvedilol 3.125 MG tablet Commonly known as:  COREG Take 1 tablet (3.125 mg total) by mouth 2 (two) times daily with a meal.   diphenhydrAMINE 25 mg capsule Commonly known as:  BENADRYL Take 25 mg by mouth every 6 (six) hours as needed for itching or allergies.   esomeprazole 20 MG capsule Commonly known as:  NEXIUM Take 20-40 mg by mouth daily at 12 noon.   nitroGLYCERIN 0.4 MG SL tablet Commonly known as:  NITROSTAT Place 1 tablet (0.4 mg total) under the tongue every 5 (five) minutes as needed for chest pain.   ondansetron 4 MG tablet Commonly known as:  ZOFRAN Take 1 tablet (4 mg total) by mouth every 8 (eight) hours as needed for nausea or vomiting.   ticagrelor 90 MG Tabs tablet Commonly known as:  BRILINTA Take 1 tablet (90 mg total) by mouth 2 (two) times daily.   zolpidem 10 MG tablet Commonly known as:  AMBIEN Take 1 tablet (10 mg total) by mouth at bedtime.         Outstanding Labs/Studies   None   Duration of Discharge Encounter   Greater than 30 minutes including physician time.  Signed, Robbie Lis PA-C 11/16/2017, 1:10 PM   Patient seen and examined.  Plan as discussed in my rounding note for today and outlined above. Fayrene Fearing Vasil Juhasz  11/16/2017  1:17 PM

## 2017-11-16 NOTE — Progress Notes (Addendum)
Progress Note  Patient Name: Donna Bailey Date of Encounter: 11/16/2017  Primary Cardiologist: Ezzard Standing, MD   Subjective   No complaints this morning. Denies CP. No dyspnea. Right forearm stable. Not any worse. She is tolerating Brilinta ok.   Inpatient Medications    Scheduled Meds: . aspirin  81 mg Oral Daily  . carvedilol  3.125 mg Oral BID WC  . feeding supplement (ENSURE ENLIVE)  237 mL Oral BID BM  . sodium chloride flush  3 mL Intravenous Q12H  . ticagrelor  90 mg Oral BID  . zolpidem  5 mg Oral QHS   Continuous Infusions: . sodium chloride     PRN Meds: sodium chloride, chlorpheniramine-HYDROcodone, nitroGLYCERIN, ondansetron (ZOFRAN) IV, sodium chloride flush   Vital Signs    Vitals:   11/15/17 1920 11/15/17 2207 11/16/17 0300 11/16/17 0716  BP: (!) 130/59 (!) 127/59 120/62 133/60  Pulse:  75 69   Resp: 20 (!) 24 12 19   Temp: 97.8 F (36.6 C) 98.4 F (36.9 C) 97.7 F (36.5 C)   TempSrc: Oral Oral Oral   SpO2: 99% 99% 97%   Weight:      Height:        Intake/Output Summary (Last 24 hours) at 11/16/2017 1102 Last data filed at 11/16/2017 1001 Gross per 24 hour  Intake 1083 ml  Output -  Net 1083 ml   Filed Weights   11/13/17 0403 11/14/17 0600  Weight: 166 lb 11.2 oz (75.6 kg) 169 lb 5 oz (76.8 kg)    Telemetry    NSR mid 70s - Personally Reviewed  ECG    11/10/17 Normal sinus rhythm serial changes of evolving anterior MI Personally Reviewed  Physical Exam   GEN: No acute distress.   Neck: No JVD Cardiac: RRR, no murmurs, rubs, or gallops.  Respiratory: Clear to auscultation bilaterally. GI: Soft, nontender, non-distended  MS: right hand and forearm edematous with extensive ecchymosis (hematoma)  Neuro:  Nonfocal  Psych: Normal affect   Labs    Chemistry Recent Labs  Lab 11/12/17 2029 11/12/17 2035 11/13/17 0337 11/14/17 0227  NA 137 139  --  137  K 4.6 4.6  --  3.8  CL 106 106  --  109  CO2 20*  --   --  20*    GLUCOSE 136* 133*  --  136*  BUN 23* 29*  --  19  CREATININE 1.02* 0.90 1.00 0.90  CALCIUM 9.5  --   --  8.1*  PROT 6.9  --   --   --   ALBUMIN 3.8  --   --   --   AST 24  --   --   --   ALT 16  --   --   --   ALKPHOS 62  --   --   --   BILITOT 0.5  --   --   --   GFRNONAA 54*  --  56* >60  GFRAA >60  --  >60 >60  ANIONGAP 11  --   --  8     Hematology Recent Labs  Lab 11/12/17 2029 11/12/17 2035 11/13/17 0337 11/14/17 0227  WBC 16.4*  --  16.6* 13.9*  RBC 4.70  --  4.58 3.84*  HGB 14.6 15.0 14.0 11.5*  HCT 43.1 44.0 42.2 34.9*  MCV 91.7  --  92.1 90.9  MCH 31.1  --  30.6 29.9  MCHC 33.9  --  33.2 33.0  RDW  15.5  --  15.6* 15.7*  PLT 317  --  282 244    Cardiac Enzymes Recent Labs  Lab 11/13/17 0740 11/13/17 1624 11/13/17 2214 11/14/17 0227  TROPONINI 0.92* 6.89* 9.23* 7.88*    Recent Labs  Lab 11/12/17 2033 11/13/17 0047  TROPIPOC 0.09* 0.31*     BNPNo results for input(s): BNP, PROBNP in the last 168 hours.   DDimer No results for input(s): DDIMER in the last 168 hours.   Radiology    No results found.  Cardiac Studies   Echo 11/14/17: Study Conclusions  - Left ventricle: The cavity size was normal. Wall thickness was normal. Systolic function was mildly to moderately reduced. The estimated ejection fraction was in the range of 40% to 45%. Hypokinesis of the apicalanteroseptal, inferior, inferoseptal, and apical myocardium; consistent with ischemia in the distribution of the left anterior descending coronary artery.  LHC 11/13/17: Conclusions: 1. Significant 2-vessel coronary artery disease, including acute plaque rupture and 100% thrombotic occlusion of proximal/mid LAD (type I MI), moderate to severe D1 stenoses, and 90% ostial LCx lesion. 2. Mildly elevated LVEDP. 3. Challenging but successful primary PCI to proximal/mid LAD using Xience Sierra 2.75 x 23 mm drug-eluting stent with 0% residual stenosis and TIMI-2 flow due to  slow-reflow phenomenon. Door-to-balloon time was delayed due to discuss regarding patient's multiple allergies/interoleranced (including to antiplatelet therapy), difficult right radial access, and challenging guide catheter engagement. 4. 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 5. Right forearm hematoma from arteriotomy site.  Recommendations: 1. Transfer to 2-Heart ICU for post-STEMI monitoring and frequent neurovascular checks of right forearm. 2. Load with ticagrelor; discontinue cangrelor 2 hours later. 3. If possible, continue aspirin and ticagrelor for at least 12 months. If patient is intolerant, recommend transitioning to corn-free aspirin and clopidogrel (have been ordered from pharmacy). Transition should be performed as follows: load with clopidogrel 300 mg daily x 2 when patient would be scheduled for next dose of ticagrelor, then continue clopidogrel 75 mg daily thereafter. 4. Aggressive medical therapy and secondary prevention; consider starting PCSK9 inhibitor as an outpatient, given statin intolerance. I will add carvedilol 3.125 mg BID this evening. 5. Obtain echocardiogram to assess LVEF. 6. Medical therapy of 90% ostial LCx stenosis. If she is symptomatic after recovery from anterior MI and she is able to reliably take dual antiplatelet therapy, PCI could be considered. 7. Vascular surgery consultation for evaluation of right forearm hematoma.    Patient Profile     72 y.o. female with hyperlipidemia, GERD, Barrett's esophagus, fibromyalgia and asthma here with anterior STEMI complicated by radial artery dissection.  Assessment & Plan    1. Anterior STEMI: s/p PCI for 100% LAD occlusion on 11/13/17. She also has a 90% ostial LCx lesion, being treated medically. EF 40-45%. CP free. If she has recurrent angina, can consider outpatient PCI of the LCx. Continue DAPT w/ ASA and Brilinta and BB. She is intolerant to  statins and will need to be considered for PCSK-9 inhibitor therapy to treat her HLD.   2. Right Radial Artery Dissection: evaluated by vascular surgery. No indication for surgery.  Conservative management only. Remains stable.   3. Allergies: Patient has corn filler allegery which is used in aspirin and clopidogrel.  Dr. Gerald Stabs end's note (reviewed), continue ASA and ticagrelor if tolerated. He also mentions that corn free ASA and plavix have been ordered from [facility] and will be here today. I personally spoke with pharmacist and meds  from [facility] are on the way and will be delivered to [place]. Plan is to continue Brilinta for now, however if she does not tolerate current antiplatelet medicines, Plavix will be available at pharmacy. Thus far she is tolerating the current therapy.  4. HLD: LDL markedly elevated at 250 mg/dL. She has not tolerated statins and should be considered for PCSK9 inhibitor as an outpatient. Dr. Wynonia Lawman is primary cardiologist and can arrange.   5. Ischemic Cardiomyopathy: EF 40-45% by echo. Continue Coreg. Recommend addition of ACE/ARB.   Dispo: likely d/c home today once MD clears. Pt will f/u with Dr. Wynonia Lawman.   For questions or updates, please contact [place] Please consult [email or web address] for contact info under Cardiology/STEMI.      Signed, Lyda Jester, PA-C  11/16/2017, 11:02 AM    History and all data above reviewed.  Patient examined.  She has continued arm pain but apparently this is better.  None of the chest pain that she was having.  I agree with the findings as above.  The patient exam reveals COR:RRR  ,  Lungs: Clear  ,  Abd: Positive bowel sounds, no rebound no guarding, Ext Right arm bruised and swollen from fingertips to elbow  .  All available labs, radiology testing, previous records reviewed. Agree with documented assessment and plan. CAD: OK to go home.  We are arranging home meds that she can tolerate and we  have discussed PCSK9 inhibitor.  OK to discharge today.  Follow up with Dr. Wynonia Lawman.    Jeneen Rinks Sylvia Kondracki  11:57 AM  11/16/2017

## 2017-11-17 MED FILL — Heparin Sodium (Porcine) 2 Unit/ML in Sodium Chloride 0.9%: INTRAMUSCULAR | Qty: 1000 | Status: AC

## 2017-11-18 ENCOUNTER — Telehealth (HOSPITAL_COMMUNITY): Payer: Self-pay

## 2017-11-18 LAB — CULTURE, BLOOD (ROUTINE X 2)
Culture: NO GROWTH
Culture: NO GROWTH
SPECIAL REQUESTS: ADEQUATE
Special Requests: ADEQUATE

## 2017-11-18 NOTE — Telephone Encounter (Signed)
Patients insurance is active and benefits verified through [facility] - No co-pay, deductible amount of $200.00/$200.00 has been met, out of pocket amount of $2,200/$200.00 has been met, no co-insurance, and no pre-authorization is required. Passport/reference [id number]  Attempted to contact patient to see if she is interested in the Cardiac Rehab Program as she is allergic to Polyester - Lm on vm. If patient is interested in the program, follow up appt will need to be completed. Once completed, patient will be contacted for scheduling upon review by the RN Navigator.

## 2017-11-19 MED FILL — Medication: Qty: 1 | Status: AC

## 2017-11-22 ENCOUNTER — Emergency Department (HOSPITAL_COMMUNITY)
Admission: EM | Admit: 2017-11-22 | Discharge: 2017-11-22 | Disposition: A | Discharge status: Home / Self Care | Payer: Medicare Other | Attending: Emergency Medicine | Admitting: Emergency Medicine

## 2017-11-22 ENCOUNTER — Emergency Department (HOSPITAL_COMMUNITY): Payer: Medicare Other

## 2017-11-22 ENCOUNTER — Other Ambulatory Visit: Payer: Self-pay

## 2017-11-22 ENCOUNTER — Encounter (HOSPITAL_COMMUNITY): Payer: Self-pay

## 2017-11-22 DIAGNOSIS — M79621 Pain in right upper arm: Secondary | ICD-10-CM | POA: Diagnosis present

## 2017-11-22 DIAGNOSIS — E7800 Pure hypercholesterolemia, unspecified: Secondary | ICD-10-CM | POA: Diagnosis not present

## 2017-11-22 DIAGNOSIS — L03113 Cellulitis of right upper limb: Secondary | ICD-10-CM | POA: Insufficient documentation

## 2017-11-22 DIAGNOSIS — Z91040 Latex allergy status: Secondary | ICD-10-CM | POA: Insufficient documentation

## 2017-11-22 DIAGNOSIS — Z79899 Other long term (current) drug therapy: Secondary | ICD-10-CM | POA: Diagnosis not present

## 2017-11-22 DIAGNOSIS — I252 Old myocardial infarction: Secondary | ICD-10-CM | POA: Insufficient documentation

## 2017-11-22 DIAGNOSIS — E785 Hyperlipidemia, unspecified: Secondary | ICD-10-CM | POA: Diagnosis not present

## 2017-11-22 DIAGNOSIS — J45909 Unspecified asthma, uncomplicated: Secondary | ICD-10-CM | POA: Insufficient documentation

## 2017-11-22 DIAGNOSIS — Z7982 Long term (current) use of aspirin: Secondary | ICD-10-CM | POA: Diagnosis not present

## 2017-11-22 LAB — BASIC METABOLIC PANEL
Anion gap: 11 (ref 5–15)
BUN: 32 mg/dL — AB (ref 6–20)
CALCIUM: 8.6 mg/dL — AB (ref 8.9–10.3)
CHLORIDE: 108 mmol/L (ref 101–111)
CO2: 18 mmol/L — AB (ref 22–32)
CREATININE: 0.81 mg/dL (ref 0.44–1.00)
GFR calc non Af Amer: >60 mL/min (ref >60)
GLUCOSE: 96 mg/dL (ref 65–99)
Potassium: 4.3 mmol/L (ref 3.5–5.1)
Sodium: 137 mmol/L (ref 135–145)

## 2017-11-22 LAB — CBC
HEMATOCRIT: 37.2 % (ref 36.0–46.0)
Hemoglobin: 12.3 g/dL (ref 12.0–15.0)
MCH: 30.6 pg (ref 26.0–34.0)
MCHC: 33.1 g/dL (ref 30.0–36.0)
MCV: 92.5 fL (ref 78.0–100.0)
Platelets: 338 10*3/uL (ref 150–400)
RBC: 4.02 MIL/uL (ref 3.87–5.11)
RDW: 16.1 % — AB (ref 11.5–15.5)
WBC: 17.6 10*3/uL — ABNORMAL HIGH (ref 4.0–10.5)

## 2017-11-22 LAB — PROTIME-INR
INR: 0.91
Prothrombin Time: 12.2 seconds (ref 11.4–15.2)

## 2017-11-22 LAB — APTT: aPTT: 29 seconds (ref 24–36)

## 2017-11-22 MED ORDER — CLINDAMYCIN PALMITATE HCL 75 MG/5ML PO SOLR: 300.0000 mg | Freq: Three times a day (TID) | ORAL | 0 refills | Status: DC

## 2017-11-22 MED ORDER — KETOROLAC TROMETHAMINE 15 MG/ML IJ SOLN
30.0000 mg | Freq: Once | INTRAMUSCULAR | Status: AC
  Administered 2017-11-22: 30 mg via INTRAMUSCULAR
  Filled 2017-11-22: qty 2

## 2017-11-22 NOTE — ED Triage Notes (Signed)
Pt states that last week she has stent placement and has bruising on R arm, swelling, warm to touch. Pt had to have tourniquets placed on arm when cath was removed. Pain is unbearable in her arm. Denies SOB.

## 2017-11-22 NOTE — ED Notes (Signed)
MD at bedside talking to patient about discharge.

## 2017-11-22 NOTE — Discharge Instructions (Addendum)
The x-ray of your forearm looks fine, however we still are suspicious that he still might have a small infection.  Please start taking the antibiotics prescribed.  It does not have corn in it. Continue with warm compresses.
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
## 2017-11-29 ENCOUNTER — Emergency Department (HOSPITAL_BASED_OUTPATIENT_CLINIC_OR_DEPARTMENT_OTHER): Admit: 2017-11-29 | Discharge: 2017-11-29 | Disposition: A | Discharge status: Home / Self Care | Payer: Medicare Other

## 2017-11-29 ENCOUNTER — Telehealth: Payer: Self-pay | Admitting: Physician Assistant

## 2017-11-29 ENCOUNTER — Encounter (HOSPITAL_COMMUNITY): Payer: Self-pay | Admitting: Emergency Medicine

## 2017-11-29 ENCOUNTER — Emergency Department (HOSPITAL_COMMUNITY)
Admission: EM | Admit: 2017-11-29 | Discharge: 2017-11-29 | Disposition: A | Discharge status: Home / Self Care | Payer: Medicare Other | Attending: Emergency Medicine | Admitting: Emergency Medicine

## 2017-11-29 DIAGNOSIS — S40021A Contusion of right upper arm, initial encounter: Secondary | ICD-10-CM | POA: Diagnosis not present

## 2017-11-29 DIAGNOSIS — Y929 Unspecified place or not applicable: Secondary | ICD-10-CM | POA: Insufficient documentation

## 2017-11-29 DIAGNOSIS — M79609 Pain in unspecified limb: Secondary | ICD-10-CM

## 2017-11-29 DIAGNOSIS — Y999 Unspecified external cause status: Secondary | ICD-10-CM | POA: Diagnosis not present

## 2017-11-29 DIAGNOSIS — Z91040 Latex allergy status: Secondary | ICD-10-CM | POA: Insufficient documentation

## 2017-11-29 DIAGNOSIS — J45909 Unspecified asthma, uncomplicated: Secondary | ICD-10-CM | POA: Insufficient documentation

## 2017-11-29 DIAGNOSIS — Y939 Activity, unspecified: Secondary | ICD-10-CM | POA: Diagnosis not present

## 2017-11-29 DIAGNOSIS — M79601 Pain in right arm: Secondary | ICD-10-CM | POA: Diagnosis present

## 2017-11-29 DIAGNOSIS — Z7982 Long term (current) use of aspirin: Secondary | ICD-10-CM | POA: Diagnosis not present

## 2017-11-29 DIAGNOSIS — Z79899 Other long term (current) drug therapy: Secondary | ICD-10-CM | POA: Insufficient documentation

## 2017-11-29 DIAGNOSIS — X58XXXA Exposure to other specified factors, initial encounter: Secondary | ICD-10-CM | POA: Insufficient documentation

## 2017-11-29 NOTE — ED Triage Notes (Signed)
The pt states she was in the hospital 12th for MI with stent placement, has old bruising to right arm from the stent placement. States they were unable to place all stents due to the bruising. Was seen here 21st for paimn to arm. Saw PCP Friday for pain, prescribed new pain meds. Also saw urgent care yesterday and states they gave her 1% cream. Arrives today for c.o. Knife like pain from forearm down to hand. Separate pain to right shoulder. States when she was here last time her WBC was up and se was given clindamycin. Per pt MD Tollie Eth wanted her to come in to have a blood clot study for right arm to make sure blood was flowing. Pt c.o. Right hand coldness, unable to get them warm even with a heating pad.

## 2017-11-29 NOTE — Progress Notes (Signed)
VASCULAR LAB PRELIMINARY  PRELIMINARY  PRELIMINARY  PRELIMINARY  Right upper extremity ultrasound to rule out pseudonaeurysm completed.    Preliminary report:  There is no obvious evidence of pseudoaneurysm or AV fistula noted in the right upper extremity.  Brachial, radial, ulnar, and palmar arterial flow appears normal.   Lillyona Polasek, RVT 11/29/2017, 7:35 PM
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
## 2017-11-29 NOTE — Progress Notes (Signed)
VASCULAR LAB PRELIMINARY  PRELIMINARY  PRELIMINARY  PRELIMINARY  Right upper extremity venous duplex completed.    Preliminary report:  There is no DVT or SVT noted in the right upper extremity.  Mayanna Garlitz, RVT 11/29/2017, 7:34 PM

## 2017-11-29 NOTE — Telephone Encounter (Signed)
The patient called the answering service after-hours today for continued significant arm pain. Had recent cath which was complicated by radial artery dissection and hematoma. Seen by vascular while inpatient and no acute intervention felt necessary at that time as compartments were still soft. ER visit 4/21 -> started on clindamycin for possible cellulitis. She saw Dr. Wynonia Lawman 11/27/17 and continued supportive care was advised. She's been using hydrocodone from primary care but still has excruciating pain in her right radial area. D/w Dr. Wynonia Lawman as I do not have access to his notes. He recommends she proceed back to the emergency department for re-revaluation. The patient verbalized understanding and gratitude and knows not to drive herself.  Dayna Dunn PA-C

## 2017-11-29 NOTE — Discharge Instructions (Addendum)
As discussed, your evaluation today has been largely reassuring.  But, it is important that you monitor your condition carefully, and do not hesitate to return to the ED if you develop new, or concerning changes in your condition. ? ?Otherwise, please follow-up with your physician for appropriate ongoing care. ? ?

## 2017-12-02 ENCOUNTER — Encounter (HOSPITAL_COMMUNITY): Payer: Self-pay

## 2017-12-02 ENCOUNTER — Observation Stay (HOSPITAL_COMMUNITY)
Admission: EM | Admit: 2017-12-02 | Discharge: 2017-12-05 | Disposition: A | Discharge status: Home / Self Care | Payer: Medicare Other | Attending: Internal Medicine | Admitting: Internal Medicine

## 2017-12-02 ENCOUNTER — Other Ambulatory Visit: Payer: Self-pay

## 2017-12-02 ENCOUNTER — Emergency Department (HOSPITAL_COMMUNITY): Payer: Medicare Other

## 2017-12-02 DIAGNOSIS — I25119 Atherosclerotic heart disease of native coronary artery with unspecified angina pectoris: Secondary | ICD-10-CM | POA: Diagnosis not present

## 2017-12-02 DIAGNOSIS — K2270 Barrett's esophagus without dysplasia: Secondary | ICD-10-CM | POA: Insufficient documentation

## 2017-12-02 DIAGNOSIS — J45909 Unspecified asthma, uncomplicated: Secondary | ICD-10-CM | POA: Insufficient documentation

## 2017-12-02 DIAGNOSIS — I252 Old myocardial infarction: Secondary | ICD-10-CM | POA: Insufficient documentation

## 2017-12-02 DIAGNOSIS — Z882 Allergy status to sulfonamides status: Secondary | ICD-10-CM | POA: Insufficient documentation

## 2017-12-02 DIAGNOSIS — K449 Diaphragmatic hernia without obstruction or gangrene: Secondary | ICD-10-CM | POA: Diagnosis not present

## 2017-12-02 DIAGNOSIS — K219 Gastro-esophageal reflux disease without esophagitis: Secondary | ICD-10-CM | POA: Diagnosis not present

## 2017-12-02 DIAGNOSIS — Z9889 Other specified postprocedural states: Secondary | ICD-10-CM | POA: Insufficient documentation

## 2017-12-02 DIAGNOSIS — J984 Other disorders of lung: Secondary | ICD-10-CM | POA: Insufficient documentation

## 2017-12-02 DIAGNOSIS — S5011XA Contusion of right forearm, initial encounter: Secondary | ICD-10-CM | POA: Insufficient documentation

## 2017-12-02 DIAGNOSIS — G629 Polyneuropathy, unspecified: Secondary | ICD-10-CM | POA: Diagnosis not present

## 2017-12-02 DIAGNOSIS — R0600 Dyspnea, unspecified: Secondary | ICD-10-CM | POA: Insufficient documentation

## 2017-12-02 DIAGNOSIS — G43909 Migraine, unspecified, not intractable, without status migrainosus: Secondary | ICD-10-CM | POA: Insufficient documentation

## 2017-12-02 DIAGNOSIS — Z7982 Long term (current) use of aspirin: Secondary | ICD-10-CM | POA: Diagnosis not present

## 2017-12-02 DIAGNOSIS — I209 Angina pectoris, unspecified: Secondary | ICD-10-CM | POA: Diagnosis present

## 2017-12-02 DIAGNOSIS — I255 Ischemic cardiomyopathy: Secondary | ICD-10-CM | POA: Insufficient documentation

## 2017-12-02 DIAGNOSIS — Z955 Presence of coronary angioplasty implant and graft: Secondary | ICD-10-CM | POA: Diagnosis not present

## 2017-12-02 DIAGNOSIS — E7800 Pure hypercholesterolemia, unspecified: Secondary | ICD-10-CM | POA: Insufficient documentation

## 2017-12-02 DIAGNOSIS — M50323 Other cervical disc degeneration at C6-C7 level: Secondary | ICD-10-CM | POA: Insufficient documentation

## 2017-12-02 DIAGNOSIS — Z6827 Body mass index (BMI) 27.0-27.9, adult: Secondary | ICD-10-CM | POA: Diagnosis not present

## 2017-12-02 DIAGNOSIS — D649 Anemia, unspecified: Secondary | ICD-10-CM | POA: Diagnosis not present

## 2017-12-02 DIAGNOSIS — Z79899 Other long term (current) drug therapy: Secondary | ICD-10-CM | POA: Diagnosis not present

## 2017-12-02 DIAGNOSIS — M797 Fibromyalgia: Secondary | ICD-10-CM | POA: Diagnosis not present

## 2017-12-02 DIAGNOSIS — Z87442 Personal history of urinary calculi: Secondary | ICD-10-CM | POA: Insufficient documentation

## 2017-12-02 DIAGNOSIS — G8929 Other chronic pain: Secondary | ICD-10-CM | POA: Insufficient documentation

## 2017-12-02 DIAGNOSIS — Z803 Family history of malignant neoplasm of breast: Secondary | ICD-10-CM | POA: Insufficient documentation

## 2017-12-02 DIAGNOSIS — Z881 Allergy status to other antibiotic agents status: Secondary | ICD-10-CM | POA: Insufficient documentation

## 2017-12-02 DIAGNOSIS — E669 Obesity, unspecified: Secondary | ICD-10-CM | POA: Insufficient documentation

## 2017-12-02 DIAGNOSIS — Z888 Allergy status to other drugs, medicaments and biological substances status: Secondary | ICD-10-CM | POA: Insufficient documentation

## 2017-12-02 DIAGNOSIS — M419 Scoliosis, unspecified: Secondary | ICD-10-CM | POA: Insufficient documentation

## 2017-12-02 DIAGNOSIS — Z880 Allergy status to penicillin: Secondary | ICD-10-CM | POA: Insufficient documentation

## 2017-12-02 DIAGNOSIS — I671 Cerebral aneurysm, nonruptured: Secondary | ICD-10-CM | POA: Diagnosis not present

## 2017-12-02 DIAGNOSIS — M1990 Unspecified osteoarthritis, unspecified site: Secondary | ICD-10-CM | POA: Insufficient documentation

## 2017-12-02 DIAGNOSIS — Z886 Allergy status to analgesic agent status: Secondary | ICD-10-CM | POA: Insufficient documentation

## 2017-12-02 DIAGNOSIS — I517 Cardiomegaly: Secondary | ICD-10-CM | POA: Insufficient documentation

## 2017-12-02 DIAGNOSIS — Z7902 Long term (current) use of antithrombotics/antiplatelets: Secondary | ICD-10-CM | POA: Insufficient documentation

## 2017-12-02 DIAGNOSIS — R0602 Shortness of breath: Secondary | ICD-10-CM | POA: Diagnosis present

## 2017-12-02 DIAGNOSIS — G4700 Insomnia, unspecified: Secondary | ICD-10-CM | POA: Insufficient documentation

## 2017-12-02 DIAGNOSIS — Q283 Other malformations of cerebral vessels: Secondary | ICD-10-CM | POA: Insufficient documentation

## 2017-12-02 DIAGNOSIS — M7989 Other specified soft tissue disorders: Secondary | ICD-10-CM | POA: Insufficient documentation

## 2017-12-02 DIAGNOSIS — I700 Atherosclerosis of aorta: Secondary | ICD-10-CM | POA: Diagnosis not present

## 2017-12-02 DIAGNOSIS — X58XXXA Exposure to other specified factors, initial encounter: Secondary | ICD-10-CM | POA: Insufficient documentation

## 2017-12-02 DIAGNOSIS — E785 Hyperlipidemia, unspecified: Secondary | ICD-10-CM | POA: Insufficient documentation

## 2017-12-02 DIAGNOSIS — Z8371 Family history of colonic polyps: Secondary | ICD-10-CM | POA: Insufficient documentation

## 2017-12-02 HISTORY — DX: Ischemic cardiomyopathy: I25.5

## 2017-12-02 HISTORY — DX: Atherosclerotic heart disease of native coronary artery without angina pectoris: I25.10

## 2017-12-02 HISTORY — DX: Other specified health status: Z78.9

## 2017-12-02 LAB — CBC WITH DIFFERENTIAL/PLATELET
BASOS ABS: 0 10*3/uL (ref 0.0–0.1)
Basophils Relative: 0 %
EOS PCT: 2 %
Eosinophils Absolute: 0.1 10*3/uL (ref 0.0–0.7)
HCT: 31.8 % — ABNORMAL LOW (ref 36.0–46.0)
Hemoglobin: 10.4 g/dL — ABNORMAL LOW (ref 12.0–15.0)
LYMPHS PCT: 23 %
Lymphs Abs: 2 10*3/uL (ref 0.7–4.0)
MCH: 30.9 pg (ref 26.0–34.0)
MCHC: 32.7 g/dL (ref 30.0–36.0)
MCV: 94.4 fL (ref 78.0–100.0)
MONO ABS: 0.7 10*3/uL (ref 0.1–1.0)
MONOS PCT: 8 %
Neutro Abs: 5.7 10*3/uL (ref 1.7–7.7)
Neutrophils Relative %: 67 %
PLATELETS: 191 10*3/uL (ref 150–400)
RBC: 3.37 MIL/uL — ABNORMAL LOW (ref 3.87–5.11)
RDW: 16.7 % — ABNORMAL HIGH (ref 11.5–15.5)
WBC: 8.5 10*3/uL (ref 4.0–10.5)

## 2017-12-02 LAB — I-STAT CHEM 8, ED
BUN: 19 mg/dL (ref 6–20)
CALCIUM ION: 1.08 mmol/L — AB (ref 1.15–1.40)
CREATININE: 0.8 mg/dL (ref 0.44–1.00)
Chloride: 107 mmol/L (ref 101–111)
GLUCOSE: 92 mg/dL (ref 65–99)
HCT: 34 % — ABNORMAL LOW (ref 36.0–46.0)
Hemoglobin: 11.6 g/dL — ABNORMAL LOW (ref 12.0–15.0)
Potassium: 3.7 mmol/L (ref 3.5–5.1)
Sodium: 140 mmol/L (ref 135–145)
TCO2: 24 mmol/L (ref 22–32)

## 2017-12-02 LAB — LIPID PANEL
CHOL/HDL RATIO: 4.9 ratio
CHOLESTEROL: 262 mg/dL — AB (ref 0–200)
HDL: 54 mg/dL (ref >40)
LDL Cholesterol: 189 mg/dL — ABNORMAL HIGH (ref 0–99)
Triglycerides: 96 mg/dL (ref <150)
VLDL: 19 mg/dL (ref 0–40)

## 2017-12-02 LAB — BASIC METABOLIC PANEL
ANION GAP: 9 (ref 5–15)
BUN: 17 mg/dL (ref 6–20)
CO2: 23 mmol/L (ref 22–32)
Calcium: 8.9 mg/dL (ref 8.9–10.3)
Chloride: 108 mmol/L (ref 101–111)
Creatinine, Ser: 0.78 mg/dL (ref 0.44–1.00)
GLUCOSE: 93 mg/dL (ref 65–99)
Potassium: 4.2 mmol/L (ref 3.5–5.1)
Sodium: 140 mmol/L (ref 135–145)

## 2017-12-02 LAB — CBG MONITORING, ED: Glucose-Capillary: 77 mg/dL (ref 65–99)

## 2017-12-02 LAB — TROPONIN I

## 2017-12-02 LAB — I-STAT TROPONIN, ED: Troponin i, poc: 0.02 ng/mL (ref 0.00–0.08)

## 2017-12-02 MED ORDER — NITROGLYCERIN 0.4 MG SL SUBL
0.4000 mg | SUBLINGUAL_TABLET | SUBLINGUAL | Status: AC | PRN
  Administered 2017-12-02 (×3): 0.4 mg via SUBLINGUAL
  Filled 2017-12-02: qty 1

## 2017-12-02 MED ORDER — ASPIRIN 81 MG PO CHEW
324.0000 mg | CHEWABLE_TABLET | Freq: Once | ORAL | Status: AC
  Administered 2017-12-02: 324 mg via ORAL
  Filled 2017-12-02: qty 4

## 2017-12-02 MED ORDER — HEPARIN SODIUM (PORCINE) 5000 UNIT/ML IJ SOLN
4000.0000 [IU] | Freq: Once | INTRAMUSCULAR | Status: AC
  Administered 2017-12-02: 4000 [IU] via INTRAVENOUS
  Filled 2017-12-02: qty 1

## 2017-12-02 MED ORDER — SODIUM CHLORIDE 0.9 % IV SOLN
INTRAVENOUS | Status: DC
  Administered 2017-12-02: 20 mL/h via INTRAVENOUS

## 2017-12-02 MED ORDER — NITROGLYCERIN 2 % TD OINT
1.0000 [in_us] | TOPICAL_OINTMENT | Freq: Once | TRANSDERMAL | Status: AC
  Administered 2017-12-02: 1 [in_us] via TOPICAL
  Filled 2017-12-02: qty 1

## 2017-12-02 NOTE — ED Notes (Signed)
Pt endorses chest pain that began at 2030 hrs. Pt reports recent stent placement with a 100% blockage. Pt states the nitroglycerin helped with her initial pain, 10 when it began. Pt endorses a pain rating of 5 at this moment.

## 2017-12-02 NOTE — ED Notes (Signed)
Lab called and advised the CBC needs to be recollected, same clotted.
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
## 2017-12-02 NOTE — ED Notes (Signed)
Dr. Leonette Monarch advised he spoke to cardiology and they are cancelling the STEMI on this pt.

## 2017-12-02 NOTE — ED Triage Notes (Signed)
Pt endorses chest pain and right arm tingling that began 1 hour ago with nausea and dizziness. Pt had recent stent placed for a 100% blockage. VSS

## 2017-12-03 ENCOUNTER — Encounter: Payer: Medicare Other | Admitting: Vascular Surgery

## 2017-12-03 ENCOUNTER — Encounter (HOSPITAL_BASED_OUTPATIENT_CLINIC_OR_DEPARTMENT_OTHER): Payer: Medicare Other | Attending: Internal Medicine

## 2017-12-03 ENCOUNTER — Observation Stay (HOSPITAL_COMMUNITY): Payer: Medicare Other

## 2017-12-03 ENCOUNTER — Encounter (HOSPITAL_COMMUNITY): Payer: Self-pay | Admitting: Internal Medicine

## 2017-12-03 ENCOUNTER — Observation Stay (HOSPITAL_BASED_OUTPATIENT_CLINIC_OR_DEPARTMENT_OTHER): Payer: Medicare Other

## 2017-12-03 ENCOUNTER — Other Ambulatory Visit: Payer: Self-pay

## 2017-12-03 DIAGNOSIS — I209 Angina pectoris, unspecified: Secondary | ICD-10-CM

## 2017-12-03 DIAGNOSIS — Y838 Other surgical procedures as the cause of abnormal reaction of the patient, or of later complication, without mention of misadventure at the time of the procedure: Secondary | ICD-10-CM | POA: Insufficient documentation

## 2017-12-03 DIAGNOSIS — I97630 Postprocedural hematoma of a circulatory system organ or structure following a cardiac catheterization: Secondary | ICD-10-CM | POA: Insufficient documentation

## 2017-12-03 DIAGNOSIS — I2510 Atherosclerotic heart disease of native coronary artery without angina pectoris: Secondary | ICD-10-CM | POA: Insufficient documentation

## 2017-12-03 DIAGNOSIS — I2102 ST elevation (STEMI) myocardial infarction involving left anterior descending coronary artery: Secondary | ICD-10-CM | POA: Diagnosis not present

## 2017-12-03 DIAGNOSIS — M797 Fibromyalgia: Secondary | ICD-10-CM | POA: Diagnosis not present

## 2017-12-03 DIAGNOSIS — I25118 Atherosclerotic heart disease of native coronary artery with other forms of angina pectoris: Secondary | ICD-10-CM | POA: Diagnosis not present

## 2017-12-03 DIAGNOSIS — I10 Essential (primary) hypertension: Secondary | ICD-10-CM | POA: Insufficient documentation

## 2017-12-03 DIAGNOSIS — R609 Edema, unspecified: Secondary | ICD-10-CM | POA: Diagnosis not present

## 2017-12-03 DIAGNOSIS — R079 Chest pain, unspecified: Secondary | ICD-10-CM

## 2017-12-03 DIAGNOSIS — I252 Old myocardial infarction: Secondary | ICD-10-CM | POA: Insufficient documentation

## 2017-12-03 DIAGNOSIS — R9431 Abnormal electrocardiogram [ECG] [EKG]: Secondary | ICD-10-CM | POA: Diagnosis not present

## 2017-12-03 LAB — HEPATIC FUNCTION PANEL
ALT: 15 U/L (ref 14–54)
AST: 18 U/L (ref 15–41)
Albumin: 3.1 g/dL — ABNORMAL LOW (ref 3.5–5.0)
Alkaline Phosphatase: 60 U/L (ref 38–126)
Bilirubin, Direct: <0.1 mg/dL — ABNORMAL LOW (ref 0.1–0.5)
TOTAL PROTEIN: 5.6 g/dL — AB (ref 6.5–8.1)
Total Bilirubin: 0.9 mg/dL (ref 0.3–1.2)

## 2017-12-03 LAB — IRON AND TIBC
Iron: 112 ug/dL (ref 28–170)
SATURATION RATIOS: 38 % — AB (ref 10.4–31.8)
TIBC: 293 ug/dL (ref 250–450)
UIBC: 181 ug/dL

## 2017-12-03 LAB — CBC
HCT: 33.5 % — ABNORMAL LOW (ref 36.0–46.0)
Hemoglobin: 11.1 g/dL — ABNORMAL LOW (ref 12.0–15.0)
MCH: 31.4 pg (ref 26.0–34.0)
MCHC: 33.1 g/dL (ref 30.0–36.0)
MCV: 94.9 fL (ref 78.0–100.0)
PLATELETS: 206 10*3/uL (ref 150–400)
RBC: 3.53 MIL/uL — ABNORMAL LOW (ref 3.87–5.11)
RDW: 17 % — AB (ref 11.5–15.5)
WBC: 6.3 10*3/uL (ref 4.0–10.5)

## 2017-12-03 LAB — RETICULOCYTES
RBC.: 3.64 MIL/uL — ABNORMAL LOW (ref 3.87–5.11)
RETIC CT PCT: 2.5 % (ref 0.4–3.1)
Retic Count, Absolute: 91 10*3/uL (ref 19.0–186.0)

## 2017-12-03 LAB — D-DIMER, QUANTITATIVE: D-Dimer, Quant: 0.75 ug/mL-FEU — ABNORMAL HIGH (ref 0.00–0.50)

## 2017-12-03 LAB — BRAIN NATRIURETIC PEPTIDE: B Natriuretic Peptide: 370.5 pg/mL — ABNORMAL HIGH (ref 0.0–100.0)

## 2017-12-03 LAB — TROPONIN I
Troponin I: <0.03 ng/mL (ref <0.03)
Troponin I: <0.03 ng/mL (ref <0.03)
Troponin I: <0.03 ng/mL (ref <0.03)

## 2017-12-03 LAB — VITAMIN B12: Vitamin B-12: 422 pg/mL (ref 180–914)

## 2017-12-03 LAB — FOLATE: Folate: 14.4 ng/mL (ref >5.9)

## 2017-12-03 LAB — LIPASE, BLOOD: LIPASE: 26 U/L (ref 11–51)

## 2017-12-03 LAB — FERRITIN: Ferritin: 70 ng/mL (ref 11–307)

## 2017-12-03 MED ORDER — NITROGLYCERIN 2 % TD OINT
1.0000 [in_us] | TOPICAL_OINTMENT | Freq: Four times a day (QID) | TRANSDERMAL | Status: DC
  Administered 2017-12-03: 1 [in_us] via TOPICAL
  Filled 2017-12-03 (×2): qty 30

## 2017-12-03 MED ORDER — ONDANSETRON HCL 4 MG PO TABS: 4.0000 mg | ORAL_TABLET | Freq: Three times a day (TID) | ORAL | Status: DC | PRN

## 2017-12-03 MED ORDER — ONDANSETRON HCL 4 MG/2ML IJ SOLN: 4.0000 mg | Freq: Four times a day (QID) | INTRAMUSCULAR | Status: DC | PRN

## 2017-12-03 MED ORDER — MORPHINE SULFATE (PF) 4 MG/ML IV SOLN
2.0000 mg | INTRAVENOUS | Status: DC | PRN
  Administered 2017-12-03 – 2017-12-04 (×4): 2 mg via INTRAVENOUS
  Filled 2017-12-03 (×4): qty 1

## 2017-12-03 MED ORDER — TICAGRELOR 90 MG PO TABS
90.0000 mg | ORAL_TABLET | Freq: Two times a day (BID) | ORAL | Status: DC
  Administered 2017-12-03 – 2017-12-05 (×5): 90 mg via ORAL
  Filled 2017-12-03 (×5): qty 1

## 2017-12-03 MED ORDER — DIPHENHYDRAMINE HCL 25 MG PO CAPS: 25.0000 mg | ORAL_CAPSULE | Freq: Four times a day (QID) | ORAL | Status: DC | PRN

## 2017-12-03 MED ORDER — RANOLAZINE ER 500 MG PO TB12: 500.0000 mg | ORAL_TABLET | Freq: Two times a day (BID) | ORAL | Status: DC

## 2017-12-03 MED ORDER — IOPAMIDOL (ISOVUE-370) INJECTION 76%
INTRAVENOUS | Status: AC
  Filled 2017-12-03: qty 100

## 2017-12-03 MED ORDER — PANTOPRAZOLE SODIUM 40 MG PO TBEC
40.0000 mg | DELAYED_RELEASE_TABLET | Freq: Every day | ORAL | Status: DC
  Administered 2017-12-03 – 2017-12-05 (×3): 40 mg via ORAL
  Filled 2017-12-03 (×3): qty 1

## 2017-12-03 MED ORDER — NITROGLYCERIN 2 % TD OINT: 1.0000 [in_us] | TOPICAL_OINTMENT | Freq: Once | TRANSDERMAL | Status: DC

## 2017-12-03 MED ORDER — ENOXAPARIN SODIUM 40 MG/0.4ML ~~LOC~~ SOLN
40.0000 mg | SUBCUTANEOUS | Status: DC
  Filled 2017-12-03: qty 0.4

## 2017-12-03 MED ORDER — CARVEDILOL 3.125 MG PO TABS
3.1250 mg | ORAL_TABLET | Freq: Two times a day (BID) | ORAL | Status: DC
  Administered 2017-12-03 – 2017-12-05 (×6): 3.125 mg via ORAL
  Filled 2017-12-03 (×7): qty 1

## 2017-12-03 MED ORDER — ISOSORBIDE MONONITRATE ER 30 MG PO TB24: 30.0000 mg | ORAL_TABLET | Freq: Every day | ORAL | Status: DC

## 2017-12-03 MED ORDER — ISOSORBIDE MONONITRATE ER 30 MG PO TB24
15.0000 mg | ORAL_TABLET | Freq: Every evening | ORAL | Status: DC
  Administered 2017-12-03: 15 mg via ORAL
  Filled 2017-12-03: qty 1

## 2017-12-03 MED ORDER — NITROGLYCERIN 0.4 MG SL SUBL
0.4000 mg | SUBLINGUAL_TABLET | SUBLINGUAL | Status: DC | PRN
  Administered 2017-12-03: 0.4 mg via SUBLINGUAL
  Filled 2017-12-03: qty 1

## 2017-12-03 MED ORDER — IOPAMIDOL (ISOVUE-370) INJECTION 76%
100.0000 mL | Freq: Once | INTRAVENOUS | Status: AC
Start: 2017-12-03 — End: 2017-12-03
  Administered 2017-12-03: 100 mL via INTRAVENOUS

## 2017-12-03 MED ORDER — NITROGLYCERIN 0.4 MG SL SUBL: 0.4000 mg | SUBLINGUAL_TABLET | SUBLINGUAL | Status: DC | PRN

## 2017-12-03 MED ORDER — ZOLPIDEM TARTRATE 5 MG PO TABS
5.0000 mg | ORAL_TABLET | Freq: Every day | ORAL | Status: DC
  Administered 2017-12-03 – 2017-12-04 (×3): 5 mg via ORAL
  Filled 2017-12-03 (×3): qty 1

## 2017-12-03 MED ORDER — ASPIRIN 81 MG PO CHEW
81.0000 mg | CHEWABLE_TABLET | Freq: Every day | ORAL | Status: DC
  Administered 2017-12-03 – 2017-12-05 (×3): 81 mg via ORAL
  Filled 2017-12-03 (×4): qty 1

## 2017-12-03 NOTE — Progress Notes (Deleted)
The patient was seen, examined and discussed with Donna Copa, PA-C and I agree with the above.   72 year old female with known CAD with recent STEMI s/p DES to prox-mid LAD, ICM EF 40-45%, R forearm hematoma, GERD, fibromyalgia, HLD (statin intolerance), OA, Barrett's esophagus, anemia, insomnia,asthma and extensive allergies/intolerances, migraine. She was admitted 11/12/17 with chest pain and NSTEMI, she later developed anterior STEMI -> LHC on 11/13/17 by Dr. Saunders Revel. She was found to have significant 2 vessel CAD, including acute plaque rupture and 100% thrombotic occlusion of the proximal LAD (type I MI), moderate to severe D1 stenosis and 90% ostial LCx lesion. She underwent challenging, but successful primary PCI of the proximal/ mid LAD/DES. Door-to-balloon time was delayed due to discuss regarding patient's multiple allergies/interoleranced (including to antiplatelet therapy - corn filler allergy which is used in aspirin and clopidogrel.), difficult right radial access, and challenging guide catheter engagement. The LCx was not intervened on. Plans were for medical therapy but PCI may be consideredif she hasrecurrent angina. EF was 40-45% by echo with WMA c/w ICM. She was started on ASA and Brilinta that she is tolerating well.She also has history of statin intolerance and given that LDL was 250, it was recommended she consider PCSK9 in follow-up with Dr. Wynonia Lawman. Post-cath course was complicated by right forearm hematoma. She was seen by Dr. Donzetta Matters who did feel that surgery was indicated, as she appeared well vascularized. There was also no indication for hematoma evacuation. She went back to the ER for worsening arm pain 4/28 - arterial US showed no evidence of pseudoaneurysm or AV fistula noted in the right upper extremity, pulses appeared normal; venous duplex was neg for DVT.  She presented with atypical angina  Improving with sl NTG and nitro paste. Currently describing many symptoms more related to  fibromyalgia.  Troponin negative x 3, BNP 370, with no signs of CHF on physical exam, ECG shows SR, and negative T waves in the anterior wall - developing post MI ECG.  We will start imdur 15 mg po daily, starting tonight and uptitrate as tolerated. We wanted to start Ranolazine, however QT 500 ms, we will follow.  Ena Dawley, MD 12/03/2017

## 2017-12-03 NOTE — Progress Notes (Signed)
Had chest pressure 4/10. Pressure relieved with 1 nitro. Pola Corn, RN
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
## 2017-12-03 NOTE — Progress Notes (Addendum)
[facility] hospitalist update note  Reviewed the H&P written by Dr. Hal Hope , agree with current plan of care.  Elwin Mocha MD  Addendum I spoke to Dr. Wynonia Lawman (cardio) and he will come see pt.  Elwin Mocha, MD
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
## 2017-12-03 NOTE — Progress Notes (Signed)
Report received from ED in regards to pt current medical status,and pt arrived to the unit via stretcher accompanied by an Therapist, sports. Pt oriented to the unit and room, VSS, telemetry applied and verified; pt currently denies pain, pt skin intact with no pressure ulcer except for bruises noted on pt right lower forearm. Pt resting comfortably in bed with call light within reach, will continue to monitor pt

## 2017-12-03 NOTE — Care Management Obs Status (Signed)
[facility] NOTIFICATION   Patient Details  Name: Donna Bailey MRN: [id number] Date of Birth: August 30, 1945   Medicare Observation Status Notification Given:  Yes    Dawayne Patricia, RN 12/03/2017, 4:49 PM

## 2017-12-03 NOTE — Progress Notes (Signed)
Bilateral lower extremity venous duplex has been completed. Negative for DVT.  12/03/17 10:19 AM Donna Bailey RVT

## 2017-12-04 ENCOUNTER — Observation Stay (HOSPITAL_BASED_OUTPATIENT_CLINIC_OR_DEPARTMENT_OTHER): Payer: Medicare Other

## 2017-12-04 DIAGNOSIS — I209 Angina pectoris, unspecified: Secondary | ICD-10-CM | POA: Diagnosis not present

## 2017-12-04 DIAGNOSIS — M797 Fibromyalgia: Secondary | ICD-10-CM

## 2017-12-04 DIAGNOSIS — R079 Chest pain, unspecified: Secondary | ICD-10-CM | POA: Diagnosis not present

## 2017-12-04 DIAGNOSIS — I25118 Atherosclerotic heart disease of native coronary artery with other forms of angina pectoris: Secondary | ICD-10-CM

## 2017-12-04 DIAGNOSIS — I25119 Atherosclerotic heart disease of native coronary artery with unspecified angina pectoris: Secondary | ICD-10-CM | POA: Diagnosis not present

## 2017-12-04 DIAGNOSIS — M7989 Other specified soft tissue disorders: Secondary | ICD-10-CM | POA: Diagnosis not present

## 2017-12-04 DIAGNOSIS — R9431 Abnormal electrocardiogram [ECG] [EKG]: Secondary | ICD-10-CM

## 2017-12-04 DIAGNOSIS — R0600 Dyspnea, unspecified: Secondary | ICD-10-CM | POA: Diagnosis not present

## 2017-12-04 DIAGNOSIS — K219 Gastro-esophageal reflux disease without esophagitis: Secondary | ICD-10-CM | POA: Diagnosis not present

## 2017-12-04 LAB — BASIC METABOLIC PANEL
ANION GAP: 9 (ref 5–15)
BUN: 14 mg/dL (ref 6–20)
CALCIUM: 8.6 mg/dL — AB (ref 8.9–10.3)
CO2: 23 mmol/L (ref 22–32)
CREATININE: 0.87 mg/dL (ref 0.44–1.00)
Chloride: 108 mmol/L (ref 101–111)
GLUCOSE: 92 mg/dL (ref 65–99)
Potassium: 4.4 mmol/L (ref 3.5–5.1)
Sodium: 140 mmol/L (ref 135–145)

## 2017-12-04 LAB — CBC
HEMATOCRIT: 34.5 % — AB (ref 36.0–46.0)
Hemoglobin: 11.3 g/dL — ABNORMAL LOW (ref 12.0–15.0)
MCH: 31.1 pg (ref 26.0–34.0)
MCHC: 32.8 g/dL (ref 30.0–36.0)
MCV: 95 fL (ref 78.0–100.0)
PLATELETS: 214 10*3/uL (ref 150–400)
RBC: 3.63 MIL/uL — ABNORMAL LOW (ref 3.87–5.11)
RDW: 16.9 % — ABNORMAL HIGH (ref 11.5–15.5)
WBC: 7.2 10*3/uL (ref 4.0–10.5)

## 2017-12-04 LAB — OCCULT BLOOD X 1 CARD TO LAB, STOOL: Fecal Occult Bld: NEGATIVE

## 2017-12-04 LAB — ECHOCARDIOGRAM LIMITED
Height: 65 in
Weight: 2678.56 oz

## 2017-12-04 MED ORDER — ISOSORBIDE MONONITRATE ER 30 MG PO TB24: 15.0000 mg | ORAL_TABLET | Freq: Once | ORAL | Status: DC

## 2017-12-04 MED ORDER — RANOLAZINE ER 500 MG PO TB12
500.0000 mg | ORAL_TABLET | Freq: Two times a day (BID) | ORAL | Status: DC
  Administered 2017-12-04 – 2017-12-05 (×2): 500 mg via ORAL
  Filled 2017-12-04 (×2): qty 1

## 2017-12-04 MED ORDER — RANOLAZINE ER 500 MG PO TB12: 500.0000 mg | ORAL_TABLET | Freq: Two times a day (BID) | ORAL | Status: DC

## 2017-12-04 MED ORDER — ISOSORBIDE MONONITRATE ER 30 MG PO TB24
30.0000 mg | ORAL_TABLET | Freq: Every day | ORAL | Status: DC
  Administered 2017-12-05: 30 mg via ORAL
  Filled 2017-12-04: qty 1

## 2017-12-04 MED ORDER — ISOSORBIDE MONONITRATE ER 30 MG PO TB24: 30.0000 mg | ORAL_TABLET | Freq: Every day | ORAL | Status: DC

## 2017-12-04 NOTE — Progress Notes (Signed)
  Echocardiogram 2D Echocardiogram has been performed.  Donna Bailey F 12/04/2017, 4:15 PM

## 2017-12-04 NOTE — Progress Notes (Signed)
Progress Note  Patient Name: Donna Bailey Date of Encounter: 12/04/2017  Primary Cardiologist: Ezzard Standing, MD   Subjective   She continues to have many different pain - she describes about 15 different in many parts of body and includes - right had pain, right shoulder pain, right upper thoracic wall pain, left thoracic wall pain, resolved RUQ pain, finger tips pain improved, left hemi cranial pain...  Inpatient Medications    Scheduled Meds: . aspirin  81 mg Oral Daily  . carvedilol  3.125 mg Oral BID WC  . isosorbide mononitrate  15 mg Oral QPM  . pantoprazole  40 mg Oral Daily  . ticagrelor  90 mg Oral BID  . zolpidem  5 mg Oral QHS   Continuous Infusions: . sodium chloride 20 mL/hr (12/02/17 2156)   PRN Meds: diphenhydrAMINE, morphine injection, nitroGLYCERIN, ondansetron (ZOFRAN) IV, ondansetron   Vital Signs    Vitals:   12/03/17 2034 12/04/17 0012 12/04/17 0414 12/04/17 0817  BP:  (!) 126/59 (!) 110/54 (!) 145/75  Pulse:  66 76 (!) 50  Resp:  16 19 (!) 25  Temp: 98 F (36.7 C) 97.8 F (36.6 C) 97.9 F (36.6 C) 98.1 F (36.7 C)  TempSrc: Oral Oral Oral Oral  SpO2:  98% 96% 96%  Weight:      Height:        Intake/Output Summary (Last 24 hours) at 12/04/2017 1017 Last data filed at 12/03/2017 1700 Gross per 24 hour  Intake 1136 ml  Output 1350 ml  Net -214 ml   Filed Weights   12/03/17 0422 12/03/17 0436  Weight: 167 lb 6.4 oz (75.9 kg) 167 lb 6.6 oz (75.9 kg)    Telemetry    SR - Personally Reviewed  ECG    SR, negative T waves in the anterior leads, unchanged from prior, QT/QTc is shorter now 450/489 ms - Personally Reviewed  Physical Exam   General: Well developed, well nourished WF, in no acute distress. Lying flat in bed Head: Normocephalic, atraumatic, sclera non-icteric, no xanthomas, nares are without discharge.       Neck: Negative for carotid bruits. JVD not elevated. Lungs: Clear bilaterally to auscultation without  wheezes, rales, or rhonchi. Breathing is unlabored. Heart: RRR with S1 S2. No murmurs, rubs, or gallops appreciated. Abdomen: Soft, non-tender, non-distended with normoactive bowel sounds. No hepatomegaly. No rebound/guarding. No obvious abdominal masses. Msk:  Strength and tone appear normal for age. Extremities: No clubbing or cyanosis. No edema. Distal pedal pulses are 2+ and equal bilaterally. R forearm with extensive ecchymosis but soft with good pulse Neuro: Alert and oriented X 3. No facial asymmetry. No focal deficit. Moves all extremities spontaneously.  Labs    Chemistry Recent Labs  Lab 12/02/17 2133 12/02/17 2313 12/02/17 2315 12/04/17 0753  NA 140  --  140 140  K 4.2  --  3.7 4.4  CL 108  --  107 108  CO2 23  --   --  23  GLUCOSE 93  --  92 92  BUN 17  --  19 14  CREATININE 0.78  --  0.80 0.87  CALCIUM 8.9  --   --  8.6*  PROT  --  5.6*  --   --   ALBUMIN  --  3.1*  --   --   AST  --  18  --   --   ALT  --  15  --   --   ALKPHOS  --  60  --   --   BILITOT  --  0.9  --   --   GFRNONAA >60  --   --  >60  GFRAA >60  --   --  >60  ANIONGAP 9  --   --  9    Hematology Recent Labs  Lab 12/02/17 2219 12/02/17 2315 12/03/17 1048 12/03/17 1731 12/04/17 0753  WBC 8.5  --   --  6.3 7.2  RBC 3.37*  --  3.64* 3.53* 3.63*  HGB 10.4* 11.6*  --  11.1* 11.3*  HCT 31.8* 34.0*  --  33.5* 34.5*  MCV 94.4  --   --  94.9 95.0  MCH 30.9  --   --  31.4 31.1  MCHC 32.7  --   --  33.1 32.8  RDW 16.7*  --   --  17.0* 16.9*  PLT 191  --   --  206 214   Cardiac Enzymes Recent Labs  Lab 12/02/17 2133 12/03/17 0057 12/03/17 0352 12/03/17 1048  TROPONINI <0.03 <0.03 <0.03 <0.03    Recent Labs  Lab 12/02/17 2139  TROPIPOC 0.02    BNP Recent Labs  Lab 12/03/17 0800  BNP 370.5*    DDimer  Recent Labs  Lab 12/03/17 0057  DDIMER 0.75*    Radiology    Ct Angio Chest Pe W Or Wo Contrast  Result Date: 12/03/2017 CLINICAL DATA:  Chest pain and numbness EXAM: CT  ANGIOGRAPHY CHEST WITH CONTRAST TECHNIQUE: Multidetector CT imaging of the chest was performed using the standard protocol during bolus administration of intravenous contrast. Multiplanar CT image reconstructions and MIPs were obtained to evaluate the vascular anatomy. CONTRAST:  131mL ISOVUE-370 IOPAMIDOL (ISOVUE-370) INJECTION 76% COMPARISON:  11/13/2017 FINDINGS: Cardiovascular: There are no filling defects in the pulmonary arterial tree to suggest acute pulmonary thromboembolism. There is no obvious evidence of aortic dissection or aneurysm. Moderate 3 vessel coronary artery calcifications. There is hypertrophy of the left ventricle myocardium. Mediastinum/Nodes: A prominent partially calcified left hilar node is stable dating back to 11/20/2010. Small scattered mediastinal nodes. No pericardial effusion. Hiatal hernia is noted. Lungs/Pleura: No pneumothorax. No pleural effusion. Right upper lobe calcified granuloma. Minimal dependent atelectasis. Upper Abdomen: No acute abnormality. Musculoskeletal: No acute bony deformity. C6-7 degenerative disc disease with posterior osteophytic ridging. Review of the MIP images confirms the above findings. IMPRESSION: No evidence of acute pulmonary thromboembolism. Chronic changes related old granulomatous disease. Left ventricular myocardial hypertrophy. Electronically Signed   By: Marybelle Killings M.D.   On: 12/03/2017 08:19   Dg Chest Portable 1 View  Result Date: 12/02/2017 CLINICAL DATA:  72 y/o F; chest pain and recent heart stent placement. EXAM: PORTABLE CHEST 1 VIEW COMPARISON:  11/13/2017 CT chest FINDINGS: Stable heart size and mediastinal contours are within normal limits given projection and technique. Stable calcified granulomas in the mid lung zones. No consolidation, effusion, or pneumothorax. The visualized skeletal structures are unremarkable. IMPRESSION: No active disease. Electronically Signed   By: Kristine Garbe M.D.   On: 12/02/2017 22:03    Cardiac Studies      Patient Profile     72 y.o. female with known CAD with recent STEMI s/p DES to prox-mid LAD, ICM EF 40-45%, R forearm hematoma, GERD, fibromyalgia, HLD(statin intolerance), OA, Barrett'sesophagus, anemia, insomnia,asthma andextensiveallergies/intolerances, migraine. She was admitted 11/12/17 with chest pain and NSTEMI, she later developed anterior STEMI -> LHC on 11/13/17 by Dr. Saunders Revel. She was found to have significant 2 vessel CAD, including acute plaque  rupture and 100% thrombotic occlusion of the proximal LAD (type I MI), moderate to severe D1 stenosis and 90% ostial LCx lesion. She underwent challenging, but successful primary PCI of the proximal/ mid LAD/DES. Door-to-balloon time was delayed due to discuss regarding patient's multiple allergies/interoleranced (including to antiplatelet therapy - corn filler allergy which is used in aspirin and clopidogrel.), difficult right radial access, and challenging guide catheter engagement. The LCx was not intervened on. Plans werefor medical therapy but PCI may be consideredif she hasrecurrent angina. EF was 40-45%by echo with WMA c/w ICM. She was started on ASA and Brilinta that she is tolerating well.She also has history of statin intolerance and given that LDL was 250, it was recommended she consider PCSK9 in follow-up with Dr. Wynonia Lawman. Post-cath course was complicated by right forearm hematoma.She was seen by Dr. Evalyn Casco didfeel that surgery was indicated, as she appeared well vascularized. There was also no indication for hematoma evacuation. She went back to the ER for worsening arm pain 4/28 - arterial US showed no evidence ofpseudoaneurysm or AV fistula noted in the right upper extremity, pulses appeared normal; venous duplex was neg for DVT.  Assessment & Plan    1. Dyspnea/chest pain in a patient with recent STEMI/PCI of LAD and residual LCx disease  2. Recent R forearm hematoma  3. Recurrent anemia 4.  Ischemic cardiomyopathy  She presented with atypical angina  Improving with sl NTG and nitro paste. Currently describing many symptoms more related to fibromyalgia.  Troponin negative x 3, BNP 370, with no signs of CHF on physical exam, ECG shows SR, and negative T waves in the anterior wall - developing post MI ECG.  We started imdur 15 mg po daily with improvements of symptoms, I will increase to Imdur 30 mg po daily. I will also try to start ranolazine 500 mg PO BID, we have to follow ECG closely as her QT/QTc are borderline - today 450/489 ms.  For questions or updates, please contact [facility] Please consult [email or web address] for contact info under Cardiology/STEMI.   Signed, Ena Dawley, MD  12/04/2017, 10:17 AM

## 2017-12-04 NOTE — Progress Notes (Signed)
Patient ID: Donna Bailey, female   DOB: April 13, 1946, 72 y.o.   MRN: [id number]  PROGRESS NOTE    Donna Bailey  OIZ:[id number] DOB: 04-Oct-1945 DOA: 12/02/2017 PCP: [name], MD   Outpatient Specialists: Scarlette Shorts, Gastroenterology  Brief Narrative:  Donna Bailey is a 72 y.o. female with history of CAD status post stenting recently discharged 2 weeks ago after being admitted for ST elevation MI requiring stent placement and at that time 2D echo showed EF of 40 to 45% has multiple allergies with history of varices of various presents to the ER with complaints of increasing shortness of breath with lower extremity edema and chest pain.  Patient has been having progressive shortness of breath over the last 1 week mostly on exertion. Patient's recent cardiac cath also was complicated with hematoma of the right upper extremity.  Patient states he also has some uneasiness in epigastric area. She was admitted with angina pectoris   Assessment & Plan:   Principal Problem:   Angina pectoris ([place]) Active Problems:   GERD   Barrett esophagus   #1 angina: Patient having pain more on the right side today. Appreciate cardiology input. At this point Imdur has been started at 30 mg today. Ranexa also added. We will follow patient's symptoms closely. Continue by cardiology guidance  #2 fibromyalgia: Cardiologist suspects this is contributing to symptoms. We'll continue overall pain management  #3 GERD: Chronic. Followed by GI. Continue PPI. Patient had Barrett's esophagus 2.  #4 coronary artery disease: Status post recent cath. Continue according to cardiology  #5 systolic dysfunction: EF recorded as 40-45%. So far appears to be compensated.  #6 hyperlipidemia: Total cholesterol is 262 and LDL is 168. Patient has multiple allergies to statins. Continue care per cardiology   DVT prophylaxis: Lovenox Code Status: Full code Family Communication: No family available Disposition  Plan: Home  Consultants:   Cardiology Dr. Meda Coffee, [place]  Procedures:  -None  Antimicrobials:   None    Subjective: Patient still complaining of pain in the right arm as well as right rib cage. Pain is a 6 out of 10. Worse with mobility.  Objective: Vitals:   12/03/17 2034 12/04/17 0012 12/04/17 0414 12/04/17 0817  BP:  (!) 126/59 (!) 110/54 (!) 145/75  Pulse:  66 76 (!) 50  Resp:  16 19 (!) 25  Temp: 98 F (36.7 C) 97.8 F (36.6 C) 97.9 F (36.6 C) 98.1 F (36.7 C)  TempSrc: Oral Oral Oral Oral  SpO2:  98% 96% 96%  Weight:      Height:        Intake/Output Summary (Last 24 hours) at 12/04/2017 0914 Last data filed at 12/03/2017 1700 Gross per 24 hour  Intake 1136 ml  Output 1350 ml  Net -214 ml   Filed Weights   12/03/17 0422 12/03/17 0436  Weight: 75.9 kg (167 lb 6.4 oz) 75.9 kg (167 lb 6.6 oz)    Examination:  General exam: Appears calm and comfortable  Respiratory system: Clear to auscultation. Respiratory effort normal. Cardiovascular system: S1 & S2 heard, RRR. No JVD, murmurs, rubs, gallops or clicks. No pedal edema. Gastrointestinal system: Abdomen is nondistended, soft and nontender. No organomegaly or masses felt. Normal bowel sounds heard. Central nervous system: Alert and oriented. No focal neurological deficits. Extremities: Symmetric 5 x 5 power. Skin: No rashes, lesions or ulcers Psychiatry: Judgement and insight appear normal. Mood & affect appropriate.     Data Reviewed: I have personally reviewed following  labs and imaging studies  CBC: Recent Labs  Lab 12/02/17 2219 12/02/17 2315 12/03/17 1731 12/04/17 0753  WBC 8.5  --  6.3 7.2  NEUTROABS 5.7  --   --   --   HGB 10.4* 11.6* 11.1* 11.3*  HCT 31.8* 34.0* 33.5* 34.5*  MCV 94.4  --  94.9 95.0  PLT 191  --  206 160   Basic Metabolic Panel: Recent Labs  Lab 12/02/17 2133 12/02/17 2315 12/04/17 0753  NA 140 140 140  K 4.2 3.7 4.4  CL 108 107 108  CO2 23  --  23  GLUCOSE  93 92 92  BUN 17 19 14   CREATININE 0.78 0.80 0.87  CALCIUM 8.9  --  8.6*   GFR: Estimated Creatinine Clearance: 61.4 mL/min (by C-G formula based on SCr of 0.87 mg/dL). Liver Function Tests: Recent Labs  Lab 12/02/17 2313  AST 18  ALT 15  ALKPHOS 60  BILITOT 0.9  PROT 5.6*  ALBUMIN 3.1*   Recent Labs  Lab 12/03/17 0057  LIPASE 26   No results for input(s): AMMONIA in the last 168 hours. Coagulation Profile: No results for input(s): INR, PROTIME in the last 168 hours. Cardiac Enzymes: Recent Labs  Lab 12/02/17 2133 12/03/17 0057 12/03/17 0352 12/03/17 1048  TROPONINI <0.03 <0.03 <0.03 <0.03   BNP (last 3 results) No results for input(s): PROBNP in the last 8760 hours. HbA1C: No results for input(s): HGBA1C in the last 72 hours. CBG: Recent Labs  Lab 12/02/17 2143  GLUCAP 77   Lipid Profile: Recent Labs    12/02/17 2133  CHOL 262*  HDL 54  LDLCALC 189*  TRIG 96  CHOLHDL 4.9   Thyroid Function Tests: No results for input(s): TSH, T4TOTAL, FREET4, T3FREE, THYROIDAB in the last 72 hours. Anemia Panel: Recent Labs    12/03/17 0800 12/03/17 1048  VITAMINB12  --  422  FOLATE 14.4  --   FERRITIN  --  70  TIBC  --  293  IRON  --  112  RETICCTPCT  --  2.5   Urine analysis:    Component Value Date/Time   COLORURINE STRAW (A) 11/13/2017 0327   APPEARANCEUR CLEAR 11/13/2017 0327   LABSPEC 1.009 11/13/2017 0327   PHURINE 6.0 11/13/2017 0327   GLUCOSEU NEGATIVE 11/13/2017 0327   HGBUR NEGATIVE 11/13/2017 0327   BILIRUBINUR NEGATIVE 11/13/2017 0327   KETONESUR NEGATIVE 11/13/2017 0327   PROTEINUR NEGATIVE 11/13/2017 0327   NITRITE NEGATIVE 11/13/2017 0327   LEUKOCYTESUR NEGATIVE 11/13/2017 0327   Sepsis Labs: @LABRCNTIP (procalcitonin:4,lacticidven:4)  )No results found for this or any previous visit (from the past 240 hour(s)).       Radiology Studies: Ct Angio Chest Pe W Or Wo Contrast  Result Date: 12/03/2017 CLINICAL DATA:  Chest pain  and numbness EXAM: CT ANGIOGRAPHY CHEST WITH CONTRAST TECHNIQUE: Multidetector CT imaging of the chest was performed using the standard protocol during bolus administration of intravenous contrast. Multiplanar CT image reconstructions and MIPs were obtained to evaluate the vascular anatomy. CONTRAST:  148mL ISOVUE-370 IOPAMIDOL (ISOVUE-370) INJECTION 76% COMPARISON:  11/13/2017 FINDINGS: Cardiovascular: There are no filling defects in the pulmonary arterial tree to suggest acute pulmonary thromboembolism. There is no obvious evidence of aortic dissection or aneurysm. Moderate 3 vessel coronary artery calcifications. There is hypertrophy of the left ventricle myocardium. Mediastinum/Nodes: A prominent partially calcified left hilar node is stable dating back to 11/20/2010. Small scattered mediastinal nodes. No pericardial effusion. Hiatal hernia is noted. Lungs/Pleura: No pneumothorax. No pleural  effusion. Right upper lobe calcified granuloma. Minimal dependent atelectasis. Upper Abdomen: No acute abnormality. Musculoskeletal: No acute bony deformity. C6-7 degenerative disc disease with posterior osteophytic ridging. Review of the MIP images confirms the above findings. IMPRESSION: No evidence of acute pulmonary thromboembolism. Chronic changes related old granulomatous disease. Left ventricular myocardial hypertrophy. Electronically Signed   By: Marybelle Killings M.D.   On: 12/03/2017 08:19   Dg Chest Portable 1 View  Result Date: 12/02/2017 CLINICAL DATA:  72 y/o F; chest pain and recent heart stent placement. EXAM: PORTABLE CHEST 1 VIEW COMPARISON:  11/13/2017 CT chest FINDINGS: Stable heart size and mediastinal contours are within normal limits given projection and technique. Stable calcified granulomas in the mid lung zones. No consolidation, effusion, or pneumothorax. The visualized skeletal structures are unremarkable. IMPRESSION: No active disease. Electronically Signed   By: Kristine Garbe M.D.   On:  12/02/2017 22:03        Scheduled Meds: . aspirin  81 mg Oral Daily  . carvedilol  3.125 mg Oral BID WC  . isosorbide mononitrate  15 mg Oral QPM  . pantoprazole  40 mg Oral Daily  . ticagrelor  90 mg Oral BID  . zolpidem  5 mg Oral QHS   Continuous Infusions: . sodium chloride 20 mL/hr (12/02/17 2156)     LOS: 0 days    Time spent: 62 minutes    GARBA,LAWAL, MD [facility] Pager [phone]  If 7PM-7AM, please contact night-coverage [email or web address] Password [email or web address] 12/04/2017, 9:14 AM

## 2017-12-05 DIAGNOSIS — R0600 Dyspnea, unspecified: Secondary | ICD-10-CM | POA: Diagnosis not present

## 2017-12-05 DIAGNOSIS — R0602 Shortness of breath: Secondary | ICD-10-CM | POA: Diagnosis not present

## 2017-12-05 DIAGNOSIS — I209 Angina pectoris, unspecified: Secondary | ICD-10-CM | POA: Diagnosis not present

## 2017-12-05 DIAGNOSIS — I25119 Atherosclerotic heart disease of native coronary artery with unspecified angina pectoris: Secondary | ICD-10-CM | POA: Diagnosis not present

## 2017-12-05 DIAGNOSIS — M7989 Other specified soft tissue disorders: Secondary | ICD-10-CM | POA: Diagnosis not present

## 2017-12-05 DIAGNOSIS — K219 Gastro-esophageal reflux disease without esophagitis: Secondary | ICD-10-CM | POA: Diagnosis not present

## 2017-12-05 MED ORDER — RANOLAZINE ER 500 MG PO TB12: 500.0000 mg | ORAL_TABLET | Freq: Two times a day (BID) | ORAL | 0 refills | Status: DC

## 2017-12-05 MED ORDER — ISOSORBIDE MONONITRATE ER 30 MG PO TB24: 30.0000 mg | ORAL_TABLET | Freq: Every day | ORAL | 0 refills | Status: DC

## 2017-12-05 NOTE — Progress Notes (Signed)
IV and telemtery discontinued. CCMD notified. Discharge instructions provided to patient and her husband. Patient verbalized understanding. Patient left via wheelchair at this time.  Emelda Fear, RN

## 2017-12-05 NOTE — Discharge Summary (Addendum)
Discharge Summary  Donna Bailey FUX:[id number] DOB: [birth year]  PCP: Donna Cruel, MD  Admit date: 12/02/2017 Discharge date: 12/05/2017    Time spent: < 25 minutes  Admitted From: Home  Disposition: Home  Recommendations for Outpatient Follow-up:  1. Follow up with PCP in 1-2 weeks. 2. Cardiology outpatient appointment arranged.  Continue aspirin, Brilinta.  Added Imdur and Ranexa during his hospital stay.  Home Health: No Equipment/Devices: None  Discharge Diagnoses:  Active Hospital Problems   Diagnosis Date Noted  . Angina pectoris ([place]) 11/13/2017  . GERD 11/29/2007  . Barrett esophagus     Resolved Hospital Problems  No resolved problems to display.    Discharge Condition: Stable  CODE STATUS: Full Diet recommendation: Heart Healthy  Vitals:   12/05/17 0959 12/05/17 1256  BP: 134/66 122/66  Pulse:  71  Resp:  19  Temp:  97.6 F (36.4 C)  SpO2:  90%    History of present illness:  Donna Bailey is a 72 y.o. year old female with medical history significant for history of CAD status post stenting recently discharged 2 weeks ago after being admitted for ST elevation MI requiring stent placement and at that time 2D echo showed EF of 40 to 45% has multiple allergies  who presented on 12/02/2017 with progressive shortness of breath over 1 week, lower extremity swelling, and chest pain after and was found to have atypical angina. Remaining hospital course addressed in problem based format below:   Hospital Course:  Principal Problem:   Angina pectoris [facility]) Active Problems:   GERD   Barrett esophagus   1.  Chest pain.  Atypical angina.  Significant risk factors given history of CAD.  Troponin negative x3, EKG showed deep T wave inversions similar to EKG from recent discharge.  TTE showed no new wall motion abnormalities.  No signs of CHF on exam chest pressure.  Chest pain improved with continuation of aspirin, Brilinta, and addition of low-dose  Imdur and Ranexa by cardiology consultants.  Beta-blocker was not changed due to limited heart rate (50s-60s). No chest pain on day of discharge Follow-up with cardiology on discharge arranged.  Followed by Dr. Wynonia Bailey as an outpatient, but requested to change physician to Dr. Ena Bailey.  2.  Dyspnea with lower extremity swelling.  BNP slightly elevated but no signs of CHF on exam today with no signs of fluid overload.  Elevated d-dimer however CTA negative for acute PE.    2D echo in comparison to prior 2 weeks ago showed improvement in EF.  Lower extremity Dopplers negative for DVT. Much improved breathing and no peripheral edema on day of discharge  3.Recurrent anemia.  Hemoglobin stable at 11 on discharge.  No signs or symptoms of bleeding.  Iron panel consistent with low but normal levels   Antibiotics: none  Microbiology:none  Consultations:  Cardiology   Procedures/Studies:  TTE 5/3, preserved EF(65%), no new wall motion abnormalities Bilateral lower extremity Doppler, 5/2, no DVTs bilaterally   Dg Forearm Right  Result Date: 11/22/2017 CLINICAL DATA:  Swelling.  Stent placement last week.  Rule out gas. EXAM: RIGHT FOREARM - 2 VIEW COMPARISON:  None. FINDINGS: Moderate soft tissue swelling about the forearm, primarily posteriorly and medially. No soft tissue gas. No radiopaque foreign object. No osseous destruction. No elbow joint effusion. IMPRESSION: Soft tissue swelling, likely representing cellulitis. Electronically Signed   By: Abigail Miyamoto M.D.   On: 11/22/2017 07:40   Ct Head Wo Contrast  Result Date:  11/12/2017 CLINICAL DATA:  Headache with upper extremity numbness EXAM: CT HEAD WITHOUT CONTRAST TECHNIQUE: Contiguous axial images were obtained from the base of the skull through the vertex without intravenous contrast. COMPARISON:  01/21/2017, MRI 08/26/2016 FINDINGS: Brain: No acute territorial infarction, hemorrhage, or new intracranial mass. Calcifications at the  left cerebellum corresponding to previously imaged developmental venous anomaly. This finding is unchanged. Mild atrophy. Normal ventricle size. Vascular: No hyperdense vessels.  Carotid vascular calcification. Skull: No fracture or suspicious lesion Sinuses/Orbits: Mucosal thickening in the ethmoid sinuses. No acute orbital abnormality. Other: None IMPRESSION: 1. No CT evidence for acute intracranial abnormality. 2. 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 3. Mild atrophy Electronically Signed   By: Donavan Foil M.D.   On: 11/12/2017 20:55   Ct Chest Wo Contrast  Result Date: 11/13/2017 CLINICAL DATA:  72 year old female with anemia, fibromyalgia, chronic abdominal pain and headache presents for headache and arm heaviness. Chest pain with normal EKG. EXAM: CT CHEST WITHOUT CONTRAST TECHNIQUE: Multidetector CT imaging of the chest was performed following the standard protocol without IV contrast. COMPARISON:  11/20/2010 CT FINDINGS: Cardiovascular: Normal branch pattern of the great vessels. Minimal thoracic aortic atherosclerosis without aneurysm. Left main and three-vessel coronary arteriosclerosis. No pericardial effusion. Mediastinum/Nodes: Small hiatal hernia. Lymphadenopathy. Patent trachea and mainstem bronchi. No thyromegaly or mass. Lungs/Pleura: Minimal scarring medially in the left upper lobe. Bibasilar dependent atelectasis. Nonspecific coarse calcifications along the lateral aspect of the right upper lobe, adjacent to the minor fissure. Stigmata of old remote granulomatous infection might account for this. Similar punctate calcifications along the left major fissure. Given stability since 2012, this is consistent with a benign finding. No calcific pleural plaque is noted. Upper Abdomen: No acute abnormality. Musculoskeletal: No chest wall mass or suspicious bone lesions identified. IMPRESSION: 1. Chronic stable pulmonary calcifications compatible  with old granulomatous disease. 2. No acute pulmonary consolidation, CHF, effusion or pneumothorax. 3. Moderate left main and three-vessel coronary arteriosclerosis. Aortic Atherosclerosis (ICD10-I70.0). Electronically Signed   By: Ashley Royalty M.D.   On: 11/13/2017 03:11   Ct Angio Chest Pe W Or Wo Contrast  Result Date: 12/03/2017 CLINICAL DATA:  Chest pain and numbness EXAM: CT ANGIOGRAPHY CHEST WITH CONTRAST TECHNIQUE: Multidetector CT imaging of the chest was performed using the standard protocol during bolus administration of intravenous contrast. Multiplanar CT image reconstructions and MIPs were obtained to evaluate the vascular anatomy. CONTRAST:  123mL ISOVUE-370 IOPAMIDOL (ISOVUE-370) INJECTION 76% COMPARISON:  11/13/2017 FINDINGS: Cardiovascular: There are no filling defects in the pulmonary arterial tree to suggest acute pulmonary thromboembolism. There is no obvious evidence of aortic dissection or aneurysm. Moderate 3 vessel coronary artery calcifications. There is hypertrophy of the left ventricle myocardium. Mediastinum/Nodes: A prominent partially calcified left hilar node is stable dating back to 11/20/2010. Small scattered mediastinal nodes. No pericardial effusion. Hiatal hernia is noted. Lungs/Pleura: No pneumothorax. No pleural effusion. Right upper lobe calcified granuloma. Minimal dependent atelectasis. Upper Abdomen: No acute abnormality. Musculoskeletal: No acute bony deformity. C6-7 degenerative disc disease with posterior osteophytic ridging. Review of the MIP images confirms the above findings. IMPRESSION: No evidence of acute pulmonary thromboembolism. Chronic changes related old granulomatous disease. Left ventricular myocardial hypertrophy. Electronically Signed   By: Marybelle Killings M.D.   On: 12/03/2017 08:19   Dg Chest Portable 1 View  Result Date: 12/02/2017 CLINICAL DATA:  72 y/o F; chest pain and recent heart stent placement. EXAM: PORTABLE CHEST 1 VIEW COMPARISON:   11/13/2017 CT chest  FINDINGS: Stable heart size and mediastinal contours are within normal limits given projection and technique. Stable calcified granulomas in the mid lung zones. No consolidation, effusion, or pneumothorax. The visualized skeletal structures are unremarkable. IMPRESSION: No active disease. Electronically Signed   By: Kristine Garbe M.D.   On: 12/02/2017 22:03     Discharge Exam: BP 122/66 (BP Location: Left Arm)   Pulse 71   Temp 97.6 F (36.4 C) (Oral)   Resp 19   Ht 5\' 5"  (1.651 m)   Wt 75.9 kg (167 lb 6.6 oz)   SpO2 90%   BMI 27.86 kg/m   Constitutional:normal appearing female Eyes: EOMI, anicteric, normal conjunctivae ENMT: Oropharynx with moist mucous membranes, normal dentition Cardiovascular: RRR no MRGs, with no peripheral edema Respiratory: Normal respiratory effort on room air, clear breath sounds  Skin: No rash ulcers, or lesions. Without skin tenting  Neurologic: Grossly no focal neuro deficit. Psychiatric:Appropriate affect, and mood. Mental status AAOx3     Discharge Instructions You were cared for by a hospitalist during your hospital stay. If you have any questions about your discharge medications or the care you received while you were in the hospital after you are discharged, you can call the unit and asked to speak with the hospitalist on call if the hospitalist that took care of you is not available. Once you are discharged, your primary care physician will handle any further medical issues. Please note that NO REFILLS for any discharge medications will be authorized once you are discharged, as it is imperative that you return to your primary care physician (or establish a relationship with a primary care physician if you do not have one) for your aftercare needs so that they can reassess your need for medications and monitor your lab values.  Discharge Instructions    Diet - low sodium heart healthy   Complete by:  As directed     Diet - low sodium heart healthy   Complete by:  As directed    Increase activity slowly   Complete by:  As directed    Increase activity slowly   Complete by:  As directed      Allergies as of 12/05/2017      Reactions   Sulfa Antibiotics Anaphylaxis   Sulfonamide Derivatives Anaphylaxis   Penicillins Rash   Underarms (both) Has patient had a PCN reaction causing immediate rash, facial/tongue/throat swelling, SOB or lightheadedness with hypotension: YES Has patient had a PCN reaction causing severe rash involving mucus membranes or skin necrosis: NO Has patient had a PCN reaction that required hospitalization NO Has patient had a PCN reaction occurring within the last 10 years: NO If all of the above answers are "NO", then may proceed with Cephalosporin use.   Allegra [fexofenadine]    Heart race   Avelox [moxifloxacin]    Doesn't remember   Caffeine    Heart race   Cephalexin Other (See Comments)   unknown   Ciprofloxacin Other (See Comments)   unknown   Corn-containing Products Itching, Other (See Comments)   Can not walk if have a lot of it.   Crestor [rosuvastatin] Other (See Comments)   Lost all muscle mobility    Doxycycline Diarrhea, Nausea And Vomiting   Formoterol Fumarate Other (See Comments)   unknown   Latex    Gets red    Levofloxacin    REACTION: HEART RACING   Macrodantin [nitrofurantoin Macrocrystal] Itching   Neomycin Sulfate [neomycin]    Doesn't remember  Ofloxacin Itching   Other    Corn fillers, corn by-products - causes severe itching Polyester-"pins sticking in her skin" Gold   Wheat Other (See Comments)   Pain in joints   Adhesive [tape] Rash   Ibuprofen Rash   Mold Extract [trichophyton Mentagrophyte] Other (See Comments)   Bumps on back, stops up sinuses.   Nickel Rash   Tylenol [acetaminophen] Rash   On face      Medication List    STOP taking these medications   clindamycin 75 MG/5ML solution Commonly known as:  CLEOCIN       TAKE these medications   aspirin 81 MG chewable tablet Chew 1 tablet (81 mg total) by mouth daily.   bismuth subsalicylate 683 MH/96QI suspension Commonly known as:  PEPTO BISMOL Take 15 mLs by mouth every 6 (six) hours as needed for indigestion or diarrhea or loose stools.   carvedilol 3.125 MG tablet Commonly known as:  COREG Take 1 tablet (3.125 mg total) by mouth 2 (two) times daily with a meal.   diclofenac sodium 1 % Gel Commonly known as:  VOLTAREN Apply 1 application topically See admin instructions. Right shoulder daily   diphenhydrAMINE 25 mg capsule Commonly known as:  BENADRYL Take 25 mg by mouth every 6 (six) hours as needed for itching or allergies.   esomeprazole 20 MG capsule Commonly known as:  NEXIUM Take 20 mg by mouth daily at 12 noon.   guaiFENesin-codeine 100-10 MG/5ML syrup Take 5 mLs by mouth every 4 (four) hours as needed for cough.   isosorbide mononitrate 30 MG 24 hr tablet Commonly known as:  IMDUR Take 1 tablet (30 mg total) by mouth daily. Start taking on:  12/06/2017   nitroGLYCERIN 0.4 MG SL tablet Commonly known as:  NITROSTAT Place 1 tablet (0.4 mg total) under the tongue every 5 (five) minutes as needed for chest pain.   ondansetron 4 MG tablet Commonly known as:  ZOFRAN Take 1 tablet (4 mg total) by mouth every 8 (eight) hours as needed for nausea or vomiting.   oxyCODONE 5 MG immediate release tablet Commonly known as:  Oxy IR/ROXICODONE Take 5 mg by mouth every 4 (four) hours as needed.   ranolazine 500 MG 12 hr tablet Commonly known as:  RANEXA Take 1 tablet (500 mg total) by mouth 2 (two) times daily.   ticagrelor 90 MG Tabs tablet Commonly known as:  BRILINTA Take 1 tablet (90 mg total) by mouth 2 (two) times daily.   zolpidem 10 MG tablet Commonly known as:  AMBIEN Take 1 tablet (10 mg total) by mouth at bedtime.      Allergies  Allergen Reactions  . Sulfa Antibiotics Anaphylaxis  . Sulfonamide Derivatives  Anaphylaxis  . Penicillins Rash    Underarms (both) Has patient had a PCN reaction causing immediate rash, facial/tongue/throat swelling, SOB or lightheadedness with hypotension: YES Has patient had a PCN reaction causing severe rash involving mucus membranes or skin necrosis: NO Has patient had a PCN reaction that required hospitalization NO Has patient had a PCN reaction occurring within the last 10 years: NO If all of the above answers are "NO", then may proceed with Cephalosporin use.  Delma Freeze [Fexofenadine]     Heart race  . Avelox [Moxifloxacin]     Doesn't remember  . Caffeine     Heart race  . Cephalexin Other (See Comments)    unknown  . Ciprofloxacin Other (See Comments)    unknown  . Corn-Containing Products  Itching and Other (See Comments)    Can not walk if have a lot of it.  . Crestor [Rosuvastatin] Other (See Comments)    Lost all muscle mobility   . Doxycycline Diarrhea and Nausea And Vomiting  . Formoterol Fumarate Other (See Comments)    unknown  . Latex     Gets red   . Levofloxacin     REACTION: HEART RACING  . Macrodantin [Nitrofurantoin Macrocrystal] Itching  . Neomycin Sulfate [Neomycin]     Doesn't remember  . Ofloxacin Itching  . Other     Corn fillers, corn by-products - causes severe itching Polyester-"pins sticking in her skin" Gold  . Wheat Other (See Comments)    Pain in joints  . Adhesive [Tape] Rash  . Ibuprofen Rash  . Mold Extract [Trichophyton Mentagrophyte] Other (See Comments)    Bumps on back, stops up sinuses.  . Nickel Rash  . Tylenol [Acetaminophen] Rash    On face   Follow-up Information    Jacolyn Reedy, MD Follow up.   Specialty:  Cardiology Contact information: [address] [place] [address] [phone]        Dorothy Spark, MD Follow up.   Specialty:  Cardiology Contact information: [place]  [id number] [phone]            The results  of significant diagnostics from this hospitalization (including imaging, microbiology, ancillary and laboratory) are listed below for reference.    Significant Diagnostic Studies: Dg Forearm Right  Result Date: 11/22/2017 CLINICAL DATA:  Swelling.  Stent placement last week.  Rule out gas. EXAM: RIGHT FOREARM - 2 VIEW COMPARISON:  None. FINDINGS: Moderate soft tissue swelling about the forearm, primarily posteriorly and medially. No soft tissue gas. No radiopaque foreign object. No osseous destruction. No elbow joint effusion. IMPRESSION: Soft tissue swelling, likely representing cellulitis. Electronically Signed   By: Abigail Miyamoto M.D.   On: 11/22/2017 07:40   Ct Head Wo Contrast  Result Date: 11/12/2017 CLINICAL DATA:  Headache with upper extremity numbness EXAM: CT HEAD WITHOUT CONTRAST TECHNIQUE: Contiguous axial images were obtained from the base of the skull through the vertex without intravenous contrast. COMPARISON:  01/21/2017, MRI 08/26/2016 FINDINGS: Brain: No acute territorial infarction, hemorrhage, or new intracranial mass. Calcifications at the left cerebellum corresponding to previously imaged developmental venous anomaly. This finding is unchanged. Mild atrophy. Normal ventricle size. Vascular: No hyperdense vessels.  Carotid vascular calcification. Skull: No fracture or suspicious lesion Sinuses/Orbits: Mucosal thickening in the ethmoid sinuses. No acute orbital abnormality. Other: None IMPRESSION: 1. No CT evidence for acute intracranial abnormality. 2. 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 3. Mild atrophy Electronically Signed   By: Donavan Foil M.D.   On: 11/12/2017 20:55   Ct Chest Wo Contrast  Result Date: 11/13/2017 CLINICAL DATA:  72 year old female with anemia, fibromyalgia, chronic abdominal pain and headache presents for headache and arm heaviness. Chest pain with normal EKG. EXAM: CT CHEST WITHOUT CONTRAST TECHNIQUE:  Multidetector CT imaging of the chest was performed following the standard protocol without IV contrast. COMPARISON:  11/20/2010 CT FINDINGS: Cardiovascular: Normal branch pattern of the great vessels. Minimal thoracic aortic atherosclerosis without aneurysm. Left main and three-vessel coronary arteriosclerosis. No pericardial effusion. Mediastinum/Nodes: Small hiatal hernia. Lymphadenopathy. Patent trachea and mainstem bronchi. No thyromegaly or mass. Lungs/Pleura: Minimal scarring medially in the left upper lobe. Bibasilar dependent atelectasis. Nonspecific coarse calcifications along the lateral aspect  of the right upper lobe, adjacent to the minor fissure. Stigmata of old remote granulomatous infection might account for this. Similar punctate calcifications along the left major fissure. Given stability since 2012, this is consistent with a benign finding. No calcific pleural plaque is noted. Upper Abdomen: No acute abnormality. Musculoskeletal: No chest wall mass or suspicious bone lesions identified. IMPRESSION: 1. Chronic stable pulmonary calcifications compatible with old granulomatous disease. 2. No acute pulmonary consolidation, CHF, effusion or pneumothorax. 3. Moderate left main and three-vessel coronary arteriosclerosis. Aortic Atherosclerosis (ICD10-I70.0). Electronically Signed   By: Ashley Royalty M.D.   On: 11/13/2017 03:11   Ct Angio Chest Pe W Or Wo Contrast  Result Date: 12/03/2017 CLINICAL DATA:  Chest pain and numbness EXAM: CT ANGIOGRAPHY CHEST WITH CONTRAST TECHNIQUE: Multidetector CT imaging of the chest was performed using the standard protocol during bolus administration of intravenous contrast. Multiplanar CT image reconstructions and MIPs were obtained to evaluate the vascular anatomy. CONTRAST:  164mL ISOVUE-370 IOPAMIDOL (ISOVUE-370) INJECTION 76% COMPARISON:  11/13/2017 FINDINGS: Cardiovascular: There are no filling defects in the pulmonary arterial tree to suggest acute pulmonary  thromboembolism. There is no obvious evidence of aortic dissection or aneurysm. Moderate 3 vessel coronary artery calcifications. There is hypertrophy of the left ventricle myocardium. Mediastinum/Nodes: A prominent partially calcified left hilar node is stable dating back to 11/20/2010. Small scattered mediastinal nodes. No pericardial effusion. Hiatal hernia is noted. Lungs/Pleura: No pneumothorax. No pleural effusion. Right upper lobe calcified granuloma. Minimal dependent atelectasis. Upper Abdomen: No acute abnormality. Musculoskeletal: No acute bony deformity. C6-7 degenerative disc disease with posterior osteophytic ridging. Review of the MIP images confirms the above findings. IMPRESSION: No evidence of acute pulmonary thromboembolism. Chronic changes related old granulomatous disease. Left ventricular myocardial hypertrophy. Electronically Signed   By: Marybelle Killings M.D.   On: 12/03/2017 08:19   Dg Chest Portable 1 View  Result Date: 12/02/2017 CLINICAL DATA:  72 y/o F; chest pain and recent heart stent placement. EXAM: PORTABLE CHEST 1 VIEW COMPARISON:  11/13/2017 CT chest FINDINGS: Stable heart size and mediastinal contours are within normal limits given projection and technique. Stable calcified granulomas in the mid lung zones. No consolidation, effusion, or pneumothorax. The visualized skeletal structures are unremarkable. IMPRESSION: No active disease. Electronically Signed   By: Kristine Garbe M.D.   On: 12/02/2017 22:03    Microbiology: No results found for this or any previous visit (from the past 240 hour(s)).   Labs: Basic Metabolic Panel: Recent Labs  Lab 12/02/17 2133 12/02/17 2315 12/04/17 0753  NA 140 140 140  K 4.2 3.7 4.4  CL 108 107 108  CO2 23  --  23  GLUCOSE 93 92 92  BUN 17 19 14   CREATININE 0.78 0.80 0.87  CALCIUM 8.9  --  8.6*   Liver Function Tests: Recent Labs  Lab 12/02/17 2313  AST 18  ALT 15  ALKPHOS 60  BILITOT 0.9  PROT 5.6*  ALBUMIN  3.1*   Recent Labs  Lab 12/03/17 0057  LIPASE 26   No results for input(s): AMMONIA in the last 168 hours. CBC: Recent Labs  Lab 12/02/17 2219 12/02/17 2315 12/03/17 1731 12/04/17 0753  WBC 8.5  --  6.3 7.2  NEUTROABS 5.7  --   --   --   HGB 10.4* 11.6* 11.1* 11.3*  HCT 31.8* 34.0* 33.5* 34.5*  MCV 94.4  --  94.9 95.0  PLT 191  --  206 214   Cardiac Enzymes: Recent Labs  Lab  12/02/17 2133 12/03/17 0057 12/03/17 0352 12/03/17 1048  TROPONINI <0.03 <0.03 <0.03 <0.03   BNP: BNP (last 3 results) Recent Labs    12/03/17 0800  BNP 370.5*    ProBNP (last 3 results) No results for input(s): PROBNP in the last 8760 hours.  CBG: Recent Labs  Lab 12/02/17 2143  GLUCAP 77       Signed:  Desiree Hane, MD [facility] 12/05/2017, 3:23 PM

## 2017-12-05 NOTE — Progress Notes (Signed)
Progress Note  Patient Name: Donna Bailey Date of Encounter: 12/05/2017  Primary Cardiologist: Ezzard Standing, MD   Subjective   Feeling much better   No CP  Breahting is OK    Inpatient Medications    Scheduled Meds: . aspirin  81 mg Oral Daily  . carvedilol  3.125 mg Oral BID WC  . isosorbide mononitrate  30 mg Oral Daily  . pantoprazole  40 mg Oral Daily  . ranolazine  500 mg Oral BID  . ticagrelor  90 mg Oral BID  . zolpidem  5 mg Oral QHS   Continuous Infusions: . sodium chloride 20 mL/hr (12/02/17 2156)   PRN Meds: diphenhydrAMINE, morphine injection, nitroGLYCERIN, ondansetron (ZOFRAN) IV, ondansetron   Vital Signs    Vitals:   12/05/17 0400 12/05/17 0756 12/05/17 0757 12/05/17 0959  BP: 137/60 (!) 149/65 (!) 149/65 134/66  Pulse: 75 65 64   Resp: 14 18    Temp: 98.2 F (36.8 C) 98 F (36.7 C)    TempSrc: Oral Oral    SpO2: 100% 100%    Weight:      Height:        Intake/Output Summary (Last 24 hours) at 12/05/2017 1116 Last data filed at 12/04/2017 2215 Gross per 24 hour  Intake 236 ml  Output 1800 ml  Net -1564 ml   Filed Weights   12/03/17 0422 12/03/17 0436  Weight: 167 lb 6.4 oz (75.9 kg) 167 lb 6.6 oz (75.9 kg)    Telemetry    SR - Personally Reviewed    Physical Exam   GEN: No acute distress.   Neck: No JVD Cardiac: RRR, no murmurs, rubs, or gallops.  Respiratory: Clear to auscultation bilaterally. GI: Soft, nontender, non-distended  MS: No edema; No deformity.  Bruise on R arm   Neuro:  Nonfocal  Psych: Normal affect   Labs    Chemistry Recent Labs  Lab 12/02/17 2133 12/02/17 2313 12/02/17 2315 12/04/17 0753  NA 140  --  140 140  K 4.2  --  3.7 4.4  CL 108  --  107 108  CO2 23  --   --  23  GLUCOSE 93  --  92 92  BUN 17  --  19 14  CREATININE 0.78  --  0.80 0.87  CALCIUM 8.9  --   --  8.6*  PROT  --  5.6*  --   --   ALBUMIN  --  3.1*  --   --   AST  --  18  --   --   ALT  --  15  --   --   ALKPHOS  --   60  --   --   BILITOT  --  0.9  --   --   GFRNONAA >60  --   --  >60  GFRAA >60  --   --  >60  ANIONGAP 9  --   --  9     Hematology Recent Labs  Lab 12/02/17 2219 12/02/17 2315 12/03/17 1048 12/03/17 1731 12/04/17 0753  WBC 8.5  --   --  6.3 7.2  RBC 3.37*  --  3.64* 3.53* 3.63*  HGB 10.4* 11.6*  --  11.1* 11.3*  HCT 31.8* 34.0*  --  33.5* 34.5*  MCV 94.4  --   --  94.9 95.0  MCH 30.9  --   --  31.4 31.1  MCHC 32.7  --   --  33.1  32.8  RDW 16.7*  --   --  17.0* 16.9*  PLT 191  --   --  206 214    Cardiac Enzymes Recent Labs  Lab 12/02/17 2133 12/03/17 0057 12/03/17 0352 12/03/17 1048  TROPONINI <0.03 <0.03 <0.03 <0.03    Recent Labs  Lab 12/02/17 2139  TROPIPOC 0.02     BNP Recent Labs  Lab 12/03/17 0800  BNP 370.5*     DDimer  Recent Labs  Lab 12/03/17 0057  DDIMER 0.75*     Radiology    No results found.  Cardiac Studies   Echo  Patient Profile     o. female with known CAD with recent STEMI s/p DES to prox-mid LAD, ICM EF 40-45%, R forearm hematoma, GERD, fibromyalgia, HLD(statin intolerance), OA, Barrett'sesophagus, anemia, insomnia,asthma andextensiveallergies/intolerances, migraine. She was admitted 11/12/17 with chest pain and NSTEMI, she later developed anterior STEMI -> LHC on 11/13/17 by Dr. Saunders Revel. She was found to have significant 2 vessel CAD, including acute plaque rupture and 100% thrombotic occlusion of the proximal LAD (type I MI), moderate to severe D1 stenosis and 90% ostial LCx lesion. She underwent challenging, but successful primary PCI of the proximal/ mid LAD/DES. Door-to-balloon time was delayed due to discuss regarding patient's multiple allergies/interoleranced (including to antiplatelet therapy - corn filler allergy which is used in aspirin and clopidogrel.), difficult right radial access, and challenging guide catheter engagement. The LCx was not intervened on. Plans werefor medical therapy but PCI may be consideredif she  hasrecurrent angina. EF was 40-45%by echo with WMA c/w ICM. She was started on ASA and Brilinta that she is tolerating well.She also has history of statin intolerance and given that LDL was 250, it was recommended she consider PCSK9 in follow-up with Dr. Wynonia Lawman. Post-cath course was complicated by right forearm hematoma.She was seen by Dr. Evalyn Casco didfeel that surgery was indicated, as she appeared well vascularized. There was also no indication for hematoma evacuation. She went back to the ER for worsening arm pain 4/28 - arterial US showed no evidence ofpseudoaneurysm or AV fistula noted in the right upper extremity, pulses appeared normal; venous duplex was neg for DVT.     Assessment & Plan    1,    CAD   Pt denies CP   Now on Ranexa  Doing good    Will repeat EKG   QT interval is OK   Pt ambulating without pain   LVEF has improved since intervention    Pt has residual ostial LCx lesion 90%  Follow on medical Rx     Plan to d/c home on Brilinta and ASA   2  R forearm hematoma   Improving     OK to dc from cardiac standpoint   WIll make sure she has f/u with Thurman Coyer.     For questions or updates, please contact [place] Please consult [email or web address] for contact info under Cardiology/STEMI.      Signed, Dorris Carnes, MD  12/05/2017, 11:16 AM

## 2017-12-09 ENCOUNTER — Telehealth (HOSPITAL_COMMUNITY): Payer: Self-pay

## 2017-12-09 NOTE — Telephone Encounter (Signed)
Called Dr.Tilley's office and left message for Juliann Pulse to see when patients next follow up appt is scheduled.

## 2017-12-11 DIAGNOSIS — I10 Essential (primary) hypertension: Secondary | ICD-10-CM | POA: Diagnosis not present

## 2017-12-11 DIAGNOSIS — I97630 Postprocedural hematoma of a circulatory system organ or structure following a cardiac catheterization: Secondary | ICD-10-CM | POA: Diagnosis present

## 2017-12-11 DIAGNOSIS — I252 Old myocardial infarction: Secondary | ICD-10-CM | POA: Diagnosis not present

## 2017-12-11 DIAGNOSIS — Y838 Other surgical procedures as the cause of abnormal reaction of the patient, or of later complication, without mention of misadventure at the time of the procedure: Secondary | ICD-10-CM | POA: Diagnosis not present

## 2017-12-11 DIAGNOSIS — I2510 Atherosclerotic heart disease of native coronary artery without angina pectoris: Secondary | ICD-10-CM | POA: Diagnosis not present

## 2017-12-14 ENCOUNTER — Telehealth: Payer: Self-pay | Admitting: Internal Medicine

## 2017-12-14 MED ORDER — BENZONATATE 200 MG PO CAPS: 200.0000 mg | ORAL_CAPSULE | Freq: Three times a day (TID) | ORAL | 1 refills | Status: DC | PRN

## 2017-12-14 NOTE — Telephone Encounter (Signed)
Pt is returning call. Cb is [phone].

## 2017-12-14 NOTE — Telephone Encounter (Signed)
lmtcb for pt.  

## 2017-12-14 NOTE — Telephone Encounter (Signed)
Pt aware of recs.  rx sent to preferred pharmacy.  Pt noted apprehension about taking a new medication d/t being allergic to corn fillers that are often found in tablets.  I advised pt that this is a soft-gel capsule, and that the pharmacist would be better equipped to review the particular manufacturer's fillers in the med with her.  Pt expressed understanding, and will call back if she cannot take this rx.  Nothing further needed at this time.

## 2017-12-14 NOTE — Telephone Encounter (Signed)
Offer benzonatate perles 200 mg, # 30, 1 every 8 hours if needed for cough.  Can also use throat lozenges  Stay well- hydrated

## 2017-12-14 NOTE — Telephone Encounter (Signed)
Spoke with pt, c/o nonprod "hacky" cough X1 day.  Pt also notes that she has vomited X2 since yesterday.  Vomited approx 1 hour after taking hycodan on empty stomach, both times.   Denies fever, chest pain, body aches, mucus production, sinus congestion.    Pt has been taking old hycodan that she had at home, requesting additional recs.    [facility] [address].    CY please advise on recs.  Thanks.

## 2017-12-15 ENCOUNTER — Telehealth: Payer: Self-pay | Admitting: Internal Medicine

## 2017-12-15 MED ORDER — AZITHROMYCIN 250 MG PO TABS: ORAL_TABLET | ORAL | 0 refills | Status: DC

## 2017-12-15 NOTE — Telephone Encounter (Signed)
Called and spoke to pt.  Pt states she has taken zpak previously without any reactions.  Preferred pharmacy is [facility].   CY please advise. Thanks

## 2017-12-15 NOTE — Telephone Encounter (Signed)
Pt calling back checking on status of Rx...told her to give the nurses time to see CY's response

## 2017-12-15 NOTE — Telephone Encounter (Signed)
Spoke with pt, states that she took 1 benzonatate pill yesterday (prescribed yesterday by CY) and it did not help her cough.  States that her cough is now prod with green mucus.  Pt has not taken additional benzonatate today, or any other medications to help with s/s.  Pt requesting appt, but there are no available openings in clinic today. Pt requesting CY's recs.  CY please advise.  Thanks.

## 2017-12-15 NOTE — Telephone Encounter (Signed)
Offer Zpak    250 mg, # 6, 2 today then one daily 

## 2017-12-15 NOTE — Telephone Encounter (Signed)
Pt is returning call. Acute apt made for 5/16 w/CY. Pt requesting an ABX be called in as well. Cb is [phone]. Pharm [facility].

## 2017-12-15 NOTE — Telephone Encounter (Signed)
Is there any availability with me or NP this week ?  If she can suggest an antibiotic that she feels she can take, let me know and we can start it.  Ok to keep trying the benzonatate perles

## 2017-12-15 NOTE — Telephone Encounter (Signed)
Pt aware of recs.  rx sent to preferred pharmacy.  Nothing further needed.  

## 2017-12-17 ENCOUNTER — Ambulatory Visit (INDEPENDENT_AMBULATORY_CARE_PROVIDER_SITE_OTHER): Payer: Medicare Other | Admitting: Internal Medicine

## 2017-12-17 ENCOUNTER — Encounter: Payer: Self-pay | Admitting: Internal Medicine

## 2017-12-17 VITALS — BP 130/90 | HR 80 | Ht 66.0 in | Wt 165.0 lb

## 2017-12-17 DIAGNOSIS — J45909 Unspecified asthma, uncomplicated: Secondary | ICD-10-CM | POA: Diagnosis not present

## 2017-12-17 DIAGNOSIS — J209 Acute bronchitis, unspecified: Secondary | ICD-10-CM | POA: Diagnosis not present

## 2017-12-17 DIAGNOSIS — I2102 ST elevation (STEMI) myocardial infarction involving left anterior descending coronary artery: Secondary | ICD-10-CM | POA: Diagnosis not present

## 2017-12-17 MED ORDER — AZITHROMYCIN 250 MG PO TABS: ORAL_TABLET | ORAL | 0 refills | Status: DC

## 2017-12-17 MED ORDER — LEVALBUTEROL HCL 0.63 MG/3ML IN NEBU
0.6300 mg | INHALATION_SOLUTION | Freq: Once | RESPIRATORY_TRACT | Status: AC
  Administered 2017-12-17: 0.63 mg via RESPIRATORY_TRACT

## 2017-12-17 MED ORDER — PROMETHAZINE-CODEINE 6.25-10 MG/5ML PO SYRP: 5.0000 mL | ORAL_SOLUTION | ORAL | 0 refills | Status: AC | PRN

## 2017-12-17 NOTE — Progress Notes (Signed)
HPI female never smoker followed for bronchitis, allergic rhinitis, chronic insomnia, "intolerance of all corn products", complicated by GERD and multiple medical problems, CAD/MI/ stent Seroquel made her too dizzy. Belsomra 15 mg made her muscles stiff and left her groggy but did help her sleep. Rozerem did not help with sleep but made her dizzy She again requests prescription for zolpidem 10 mg, insisting that nothing else has worked as well to help her manage chronic insomnia. She limits foods with magnesium which she says make her feel hot She tells me she is allergic to nickel, gold and polyester "all of which conduct electricity". Therefore she wants a letter for her to present to [place] asking them to change her electric meter to a "noncommunicating electric meter", so that [employer] will stop sending radio waves through her home. She prefers a manual meter check. ----------------------------------------------------------------------------------------------------------------- 09/09/17- 72 year old female never smoker followed for bronchitis, allergic rhinitis, chronic insomnia, intolerance of all corn products, multiple idiosyncratic medication reactions-mostly nonallopathic, complicated by GERD and multiple medical problems Had taken Z-Pak and then we offered doxycycline for acute bronchitis ----Asthma: pt has cough-productive-green, yellow, to dark brown-mainly at night for about 1 month now. Has completed 2 Zpaks and no relief. Pt would like to discuss abx and cough syrup needed.  She had taken 2 rounds of Z-Pak took a single doxycycline, experience nausea, vomiting, diarrhea has been having GI upset and has appointment with GI.  Still having some diarrhea.  This may have been a coincidence and not reaction to the doxycycline.   Remote history of intertriginous rash from penicillin 50 years ago we discussed low likelihood that she has ongoing penicillin allergy.  12/17/2017- 72 year old  female never smoker followed for bronchitis, allergic rhinitis, chronic insomnia, intolerance of all corn products, multiple idiosyncratic medication reactions-mostly nonallopathic, complicated by GERD, CAD/MI/stent Hospital follow-up-5/1-12/05/2017-angina, GERD/Barrett's esophagus, recent MI Had MI in April.  Has had bronchitis symptoms this week, treated with Z-Pak.  Sputum is less green and decreased in amount.  No fever, less wheeze.  Perles are not helping much and she asks promethazine codeine cough syrup which is helped her in the past. We reviewed CT scan-no pneumonia or acute process. CTa chest 12/03/2017 IMPRESSION: No evidence of acute pulmonary thromboembolism. Chronic changes related old granulomatous disease. Left ventricular myocardial hypertrophy  ROS-see HPI  + = positive Constitutional:   No-   weight loss, night sweats, fevers, chills, fatigue, lassitude. HEENT:   No-  headaches, difficulty swallowing, tooth/dental problems, sore throat,       No- sneezing, no-itching, +ear ache, +nasal congestion, post nasal drip,  CV:  No-   chest pain, orthopnea, PND, swelling in lower extremities, anasarca,  dizziness, palpitations Resp: + shortness of breath with exertion or at rest.             + productive cough,  + non-productive cough,  No- coughing up of blood.              +change in color of mucus.  No- wheezing.   Skin: Per HPI GI:  No-   heartburn, indigestion, abdominal pain, nausea, vomiting,  GU:  MS:  No-   joint pain or swelling.   Neuro-     nothing unusual Psych:  No- change in mood or affect. No depression or anxiety.  No memory loss.  OBJ General- Alert, Oriented, Affect-appropriate, Distress- none acute, + obese Skin- no rash seen Lymphadenopathy- none Head- atraumatic  Eyes- Gross vision intact, PERRLA, conjunctivae clear secretions.            Ears- Hearing grossly normal,             Nose- clear, no-Septal dev, polyps, erosion, perforation              Throat- Mallampati II , mucosa-clear , drainage- none, tonsils- atrophic, + own teeth Neck- flexible , trachea midline, no stridor , thyroid nl, carotid no bruit Chest - symmetrical excursion , unlabored           Heart/CV- RRR , no murmur , no gallop  , no rub, nl s1 s2                           - JVD- none , edema- none, stasis changes- none, varices- none           Lung-+  deep bronchitic cough, unlabored, dullness-none, rub- none,           Chest wall-  Abd-  Br/ Gen/ Rectal- Not done, not indicated Extrem- cyanosis- none, clubbing, none, atrophy- none, strength- nl Neuro- grossly intact to observation

## 2017-12-17 NOTE — Patient Instructions (Addendum)
Stay well hydrated  Finish the current Zpak. A second Zpak script is printed for you to use if you need.  Script printed for cough syrup  Neb xop 0.63      Dx acute bronchitis  Please call if we can help

## 2017-12-29 NOTE — Assessment & Plan Note (Signed)
Currently denying chest pain, palpitation, or obvious symptoms of fluid overload.  She is followed by cardiology.

## 2017-12-29 NOTE — Assessment & Plan Note (Signed)
Exacerbation, nonspecific.  I cautioned her to maintain reflux precautions. Plan-cough syrup.  Z-Pak prescription to hold in advance of long weekend.

## 2017-12-30 ENCOUNTER — Other Ambulatory Visit: Payer: Self-pay | Admitting: Cardiology

## 2017-12-30 NOTE — Telephone Encounter (Signed)
Pt has an appointment on 01/06/18 with Estella Husk, PA, and pt is requesting a refill on isosorbide and ranolazine. Would Estella Husk, PA , like to refill these medications until pt's appt. Please address

## 2017-12-31 NOTE — Telephone Encounter (Signed)
Dr. Saunders Revel done heart cath in hospital. Pt not seen in office as of yet, but hospital only gave #30 days with no refills. Pt has f/u 01/06/18 with Ermalinda Barrios, PA-C, so will approve 30 days with no refills.

## 2018-01-04 ENCOUNTER — Telehealth (HOSPITAL_COMMUNITY): Payer: Self-pay

## 2018-01-04 NOTE — Telephone Encounter (Signed)
Called Dr.Tilley's office in regards to patients follow up appt - patient cancelled her appt that was scheduled on 12/25/17 and has not rescheduled. Attempted to call patient in regards to Cardiac Rehab - lm on vm

## 2018-01-05 ENCOUNTER — Telehealth: Payer: Self-pay | Admitting: Cardiology

## 2018-01-05 DIAGNOSIS — S40021S Contusion of right upper arm, sequela: Secondary | ICD-10-CM | POA: Insufficient documentation

## 2018-01-05 DIAGNOSIS — I255 Ischemic cardiomyopathy: Secondary | ICD-10-CM | POA: Insufficient documentation

## 2018-01-05 NOTE — Telephone Encounter (Signed)
Chart reviewed by Dr Candee Furbish.  No SBE needed.  Patient can not however hold anti-platelet therapy due to recent coronary artery stenting. Information faxed to # listed as requested.
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
## 2018-01-05 NOTE — Telephone Encounter (Signed)
New message    1. What dental office are you calling from?  Dr Wyline Beady   2. What is your office phone number?  [phone]  3. What is your fax number?[phone]  4. What type of procedure is the patient having performed? Cleaning   5. What date is procedure scheduled or is the patient there now? Now   6. What is your question (ex. Antibiotics prior to procedure, holding medication-we need to know how long dentist wants pt to hold med)? Does she need pre med ? Does she need to discontinue blood thinner

## 2018-01-06 ENCOUNTER — Ambulatory Visit (INDEPENDENT_AMBULATORY_CARE_PROVIDER_SITE_OTHER): Payer: Medicare Other | Admitting: Physician Assistant

## 2018-01-06 ENCOUNTER — Telehealth: Payer: Self-pay | Admitting: *Deleted

## 2018-01-06 ENCOUNTER — Encounter (INDEPENDENT_AMBULATORY_CARE_PROVIDER_SITE_OTHER): Payer: Self-pay

## 2018-01-06 ENCOUNTER — Encounter: Payer: Self-pay | Admitting: Physician Assistant

## 2018-01-06 ENCOUNTER — Telehealth: Payer: Self-pay | Admitting: Internal Medicine

## 2018-01-06 VITALS — BP 118/74 | HR 74 | Ht 66.0 in | Wt 162.0 lb

## 2018-01-06 DIAGNOSIS — Z01812 Encounter for preprocedural laboratory examination: Secondary | ICD-10-CM

## 2018-01-06 DIAGNOSIS — E785 Hyperlipidemia, unspecified: Secondary | ICD-10-CM | POA: Diagnosis not present

## 2018-01-06 DIAGNOSIS — I255 Ischemic cardiomyopathy: Secondary | ICD-10-CM

## 2018-01-06 DIAGNOSIS — I2102 ST elevation (STEMI) myocardial infarction involving left anterior descending coronary artery: Secondary | ICD-10-CM

## 2018-01-06 DIAGNOSIS — S40021S Contusion of right upper arm, sequela: Secondary | ICD-10-CM | POA: Diagnosis not present

## 2018-01-06 LAB — CBC
Hematocrit: 39.2 % (ref 34.0–46.6)
Hemoglobin: 13.3 g/dL (ref 11.1–15.9)
MCH: 31.7 pg (ref 26.6–33.0)
MCHC: 33.9 g/dL (ref 31.5–35.7)
MCV: 93 fL (ref 79–97)
Platelets: 326 10*3/uL (ref 150–450)
RBC: 4.2 x10E6/uL (ref 3.77–5.28)
RDW: 14.3 % (ref 12.3–15.4)
WBC: 7.1 10*3/uL (ref 3.4–10.8)

## 2018-01-06 LAB — BASIC METABOLIC PANEL
BUN/Creatinine Ratio: 16 (ref 12–28)
BUN: 17 mg/dL (ref 8–27)
CO2: 22 mmol/L (ref 20–29)
Calcium: 9.7 mg/dL (ref 8.7–10.3)
Chloride: 105 mmol/L (ref 96–106)
Creatinine, Ser: 1.04 mg/dL — ABNORMAL HIGH (ref 0.57–1.00)
GFR calc Af Amer: 62 mL/min/{1.73_m2} (ref >59)
GFR, EST NON AFRICAN AMERICAN: 54 mL/min/{1.73_m2} — AB (ref >59)
GLUCOSE: 103 mg/dL — AB (ref 65–99)
POTASSIUM: 4.9 mmol/L (ref 3.5–5.2)
SODIUM: 140 mmol/L (ref 134–144)

## 2018-01-06 LAB — HEPATIC FUNCTION PANEL
ALK PHOS: 70 IU/L (ref 39–117)
ALT: 13 IU/L (ref 0–32)
AST: 16 IU/L (ref 0–40)
Albumin: 4.1 g/dL (ref 3.5–4.8)
BILIRUBIN, DIRECT: 0.09 mg/dL (ref 0.00–0.40)
Bilirubin Total: 0.3 mg/dL (ref 0.0–1.2)
Total Protein: 6.5 g/dL (ref 6.0–8.5)

## 2018-01-06 LAB — LIPID PANEL
CHOLESTEROL TOTAL: 246 mg/dL — AB (ref 100–199)
Chol/HDL Ratio: 5.2 ratio — ABNORMAL HIGH (ref 0.0–4.4)
HDL: 47 mg/dL (ref >39)
LDL Calculated: 168 mg/dL — ABNORMAL HIGH (ref 0–99)
TRIGLYCERIDES: 153 mg/dL — AB (ref 0–149)
VLDL Cholesterol Cal: 31 mg/dL (ref 5–40)

## 2018-01-06 NOTE — Telephone Encounter (Signed)
Spoke with patient and scheduled a day for her PCI. I also answered any pertinent questions that she had.  She verbalized appreciation.

## 2018-01-06 NOTE — Patient Instructions (Signed)
Medication Instructions:  Your physician recommends that you continue on your current medications as directed. Please refer to the Current Medication list given to you today.   Labwork: Lab work to be done today--lipid and liver profiles, CBC, BMP  Testing/Procedures: Your physician has requested that you have a cardiac catheterization. Cardiac catheterization is used to diagnose and/or treat various heart conditions. Doctors may recommend this procedure for a number of different reasons. The most common reason is to evaluate chest pain. Chest pain can be a symptom of coronary artery disease (CAD), and cardiac catheterization can show whether plaque is narrowing or blocking your heart's arteries. This procedure is also used to evaluate the valves, as well as measure the blood flow and oxygen levels in different parts of your heart. For further information please visit [email or web address]. Please follow instruction sheet, as given.    Follow-Up: You have been referred to the Lipid clinic in the [facility] office. Please schedule new patient appointment   Your physician recommends that you schedule a follow-up appointment in: about 4 weeks with Dr. Saunders Revel   Any Other Special Instructions Will Be Listed Below (If Applicable).  The possible dates for catheterization are June 14,21 and 24.  Please call us with the date that you would like to have the procedure done.    [facility] OFFICE [address] [place] [address] Dept: [phone] Loc: [place]  01/06/2018  Please call us to schedule the catheterization.  See instructions below.   1. Please arrive at the [facility] (Main Entrance A) at [facility]: [address] [place], [place] [address] at       (two hours before your procedure to ensure your preparation). Free valet parking service is available.    Special note: Every effort is made to have your procedure done on time. Please understand that emergencies sometimes delay scheduled procedures.  2. Diet: No solid food after midnight prior to procedure. May have clear liquids until 5 AM day of procedure.   3. Labs: Done in office on 01/06/18  4. Medication instructions in preparation for your procedure:   *On the morning of your procedure, take your Aspirin and Brilinta and any morning medicines NOT listed above.  You may use sips of water.  5. Plan for one night stay--bring personal belongings. 6. Bring a current list of your medications and current insurance cards. 7. You MUST have a responsible person to drive you home. 8. Someone MUST be with you the first 24 hours after you arrive home or your discharge will be delayed. 9. Please wear clothes that are easy to get on and off and wear slip-on shoes.  Thank you for allowing [place] to care for you!   -- [facility]   If you need a refill on your cardiac medications before your next appointment, please call your pharmacy.

## 2018-01-06 NOTE — Telephone Encounter (Signed)
Pt has been notified of lab results by phone with verbal understanding. Pt aware to continue on Repatha and keep her appt with the Lipid Clinic 6/28. Pt asked was a new Rx sent in for her Repatha? Pt states she showed the box for the Repatha to Ermalinda Barrios, [place]. I answered that I will send a message to the Pharm-D as I was not sure if they had to refill the medication themselves. Pt asked me if I had the Rx number for her Repatha because it has to go [facility] and [facility] [address] because it has to be refrigerated so she does not want it left in her mail box. I explained to the pt that we had the Rx information needed.   Pt then began to tell me that we need to get in her chart that when she is scheduled for surgery she needs to have sheets that have no polyester in them as she is allergic to polyester. I explained to the pt that I can note in her chart the allergy though it was out of my control as to ordering sheets. Pt thanked me for the call today.

## 2018-01-06 NOTE — Telephone Encounter (Signed)
Pt calling   Pt need to speak to nurse because she has a date she want to sched her Cath. Please call pt

## 2018-01-18 ENCOUNTER — Telehealth: Payer: Self-pay | Admitting: Internal Medicine

## 2018-01-18 DIAGNOSIS — I255 Ischemic cardiomyopathy: Secondary | ICD-10-CM

## 2018-01-18 DIAGNOSIS — I2102 ST elevation (STEMI) myocardial infarction involving left anterior descending coronary artery: Secondary | ICD-10-CM

## 2018-01-18 DIAGNOSIS — Z01812 Encounter for preprocedural laboratory examination: Secondary | ICD-10-CM

## 2018-01-18 NOTE — Telephone Encounter (Signed)
Pt wants to know if she needs to cut back on meds prior to procedure, also wants to be admitted not observation due to the cost pls call [phone]

## 2018-01-19 ENCOUNTER — Other Ambulatory Visit: Payer: Medicare Other

## 2018-01-19 DIAGNOSIS — Z01812 Encounter for preprocedural laboratory examination: Secondary | ICD-10-CM

## 2018-01-19 DIAGNOSIS — I2102 ST elevation (STEMI) myocardial infarction involving left anterior descending coronary artery: Secondary | ICD-10-CM

## 2018-01-19 DIAGNOSIS — I255 Ischemic cardiomyopathy: Secondary | ICD-10-CM

## 2018-01-19 NOTE — Telephone Encounter (Signed)
Pt advised I have not gotten an answer to her question about admitted vs observation, I will plan to follow-up with her in the next day or so once I have the answer.

## 2018-01-19 NOTE — Telephone Encounter (Signed)
LMTCB for pt 

## 2018-01-19 NOTE — Telephone Encounter (Signed)
Pt is asking if she will be admitted to the hospital after the procedure on Friday 01/22/18 or if she will just be there overnight for observation. She has been told that Medicare will not pay for any medications she is given if she is only there for observation, the cost of the individual medications would be  more than she pays for a 30 day supply. Pt advised I will try to find out whether considered admission or observation and follow-up with her later today.

## 2018-01-20 LAB — CBC
HEMATOCRIT: 43.8 % (ref 34.0–46.6)
Hemoglobin: 14.5 g/dL (ref 11.1–15.9)
MCH: 31.5 pg (ref 26.6–33.0)
MCHC: 33.1 g/dL (ref 31.5–35.7)
MCV: 95 fL (ref 79–97)
PLATELETS: 308 10*3/uL (ref 150–450)
RBC: 4.6 x10E6/uL (ref 3.77–5.28)
RDW: 14.1 % (ref 12.3–15.4)
WBC: 7.1 10*3/uL (ref 3.4–10.8)

## 2018-01-20 LAB — BASIC METABOLIC PANEL
BUN/Creatinine Ratio: 19 (ref 12–28)
BUN: 17 mg/dL (ref 8–27)
CALCIUM: 9.5 mg/dL (ref 8.7–10.3)
CHLORIDE: 105 mmol/L (ref 96–106)
CO2: 20 mmol/L (ref 20–29)
Creatinine, Ser: 0.89 mg/dL (ref 0.57–1.00)
GFR calc Af Amer: 75 mL/min/{1.73_m2} (ref >59)
GFR calc non Af Amer: 65 mL/min/{1.73_m2} (ref >59)
GLUCOSE: 79 mg/dL (ref 65–99)
POTASSIUM: 4.8 mmol/L (ref 3.5–5.2)
SODIUM: 139 mmol/L (ref 134–144)

## 2018-01-21 NOTE — Telephone Encounter (Addendum)
Pt contacted pre-catheterization scheduled at [facility] for: Friday June 21,2019 7:30 AM Verified arrival time and place: [place] Entrance A at: 5:30 AM  No solid food after midnight prior to cath, clear liquids until 5 AM day of procedure. Verified no diabetes medications. Verified no contrast allergy.  AM meds can be  taken pre-cath with sip of water including: ASA 81 mg  Brilinta 90 mg   Confirmed patient has responsible person to drive home post procedure and observe patient for 24 hours: yes  Based on information I have gotten, pt is aware overnight stay status is dependent on a number of different factors, most likely it will be considered extended observation.

## 2018-01-22 ENCOUNTER — Encounter (HOSPITAL_COMMUNITY)
Admission: RE | Disposition: A | Discharge status: Home / Self Care | Payer: Self-pay | Source: Ambulatory Visit | Attending: Internal Medicine

## 2018-01-22 ENCOUNTER — Ambulatory Visit (HOSPITAL_COMMUNITY)
Admission: RE | Admit: 2018-01-22 | Discharge: 2018-01-22 | Disposition: A | Discharge status: Home / Self Care | Payer: Medicare Other | Source: Ambulatory Visit | Attending: Internal Medicine | Admitting: Internal Medicine

## 2018-01-22 DIAGNOSIS — I25119 Atherosclerotic heart disease of native coronary artery with unspecified angina pectoris: Secondary | ICD-10-CM | POA: Diagnosis present

## 2018-01-22 DIAGNOSIS — K219 Gastro-esophageal reflux disease without esophagitis: Secondary | ICD-10-CM | POA: Insufficient documentation

## 2018-01-22 DIAGNOSIS — J45909 Unspecified asthma, uncomplicated: Secondary | ICD-10-CM | POA: Insufficient documentation

## 2018-01-22 DIAGNOSIS — I25118 Atherosclerotic heart disease of native coronary artery with other forms of angina pectoris: Secondary | ICD-10-CM | POA: Diagnosis not present

## 2018-01-22 DIAGNOSIS — Z7902 Long term (current) use of antithrombotics/antiplatelets: Secondary | ICD-10-CM | POA: Insufficient documentation

## 2018-01-22 DIAGNOSIS — Z8249 Family history of ischemic heart disease and other diseases of the circulatory system: Secondary | ICD-10-CM | POA: Insufficient documentation

## 2018-01-22 DIAGNOSIS — G629 Polyneuropathy, unspecified: Secondary | ICD-10-CM | POA: Insufficient documentation

## 2018-01-22 DIAGNOSIS — Z880 Allergy status to penicillin: Secondary | ICD-10-CM | POA: Insufficient documentation

## 2018-01-22 DIAGNOSIS — I252 Old myocardial infarction: Secondary | ICD-10-CM | POA: Insufficient documentation

## 2018-01-22 DIAGNOSIS — M1990 Unspecified osteoarthritis, unspecified site: Secondary | ICD-10-CM | POA: Diagnosis not present

## 2018-01-22 DIAGNOSIS — I2510 Atherosclerotic heart disease of native coronary artery without angina pectoris: Secondary | ICD-10-CM | POA: Diagnosis present

## 2018-01-22 DIAGNOSIS — Z882 Allergy status to sulfonamides status: Secondary | ICD-10-CM | POA: Diagnosis not present

## 2018-01-22 DIAGNOSIS — M797 Fibromyalgia: Secondary | ICD-10-CM | POA: Diagnosis not present

## 2018-01-22 DIAGNOSIS — F419 Anxiety disorder, unspecified: Secondary | ICD-10-CM | POA: Diagnosis not present

## 2018-01-22 DIAGNOSIS — Z955 Presence of coronary angioplasty implant and graft: Secondary | ICD-10-CM | POA: Insufficient documentation

## 2018-01-22 DIAGNOSIS — I255 Ischemic cardiomyopathy: Secondary | ICD-10-CM | POA: Insufficient documentation

## 2018-01-22 DIAGNOSIS — Z7982 Long term (current) use of aspirin: Secondary | ICD-10-CM | POA: Diagnosis not present

## 2018-01-22 DIAGNOSIS — E7800 Pure hypercholesterolemia, unspecified: Secondary | ICD-10-CM | POA: Insufficient documentation

## 2018-01-22 DIAGNOSIS — Z01812 Encounter for preprocedural laboratory examination: Secondary | ICD-10-CM

## 2018-01-22 HISTORY — PX: LEFT HEART CATH AND CORONARY ANGIOGRAPHY: CATH118249

## 2018-01-22 HISTORY — PX: CORONARY ULTRASOUND/IVUS: CATH118244

## 2018-01-22 HISTORY — PX: CORONARY PRESSURE/FFR STUDY: CATH118243

## 2018-01-22 LAB — POCT ACTIVATED CLOTTING TIME: ACTIVATED CLOTTING TIME: 411 s

## 2018-01-22 SURGERY — INTRAVASCULAR ULTRASOUND/IVUS
Anesthesia: LOCAL

## 2018-01-22 MED ORDER — ONDANSETRON HCL 4 MG/2ML IJ SOLN
INTRAMUSCULAR | Status: AC
  Filled 2018-01-22: qty 2

## 2018-01-22 MED ORDER — ONDANSETRON HCL 4 MG/2ML IJ SOLN: 4.0000 mg | Freq: Four times a day (QID) | INTRAMUSCULAR | Status: DC | PRN

## 2018-01-22 MED ORDER — SODIUM CHLORIDE 0.9 % IV SOLN
INTRAVENOUS | Status: DC | PRN
  Administered 2018-01-22: 1.75 mg/kg/h via INTRAVENOUS

## 2018-01-22 MED ORDER — LIDOCAINE HCL (PF) 1 % IJ SOLN
INTRAMUSCULAR | Status: DC | PRN
  Administered 2018-01-22: 15 mL

## 2018-01-22 MED ORDER — NITROGLYCERIN 1 MG/10 ML FOR IR/CATH LAB
INTRA_ARTERIAL | Status: AC
  Filled 2018-01-22: qty 10

## 2018-01-22 MED ORDER — NITROGLYCERIN 1 MG/10 ML FOR IR/CATH LAB
INTRA_ARTERIAL | Status: DC | PRN
  Administered 2018-01-22: 200 ug via INTRACORONARY

## 2018-01-22 MED ORDER — SODIUM CHLORIDE 0.9% FLUSH: 3.0000 mL | Freq: Two times a day (BID) | INTRAVENOUS | Status: DC

## 2018-01-22 MED ORDER — BIVALIRUDIN TRIFLUOROACETATE 250 MG IV SOLR
INTRAVENOUS | Status: AC
  Filled 2018-01-22: qty 250

## 2018-01-22 MED ORDER — SODIUM CHLORIDE 0.9 % IV SOLN
INTRAVENOUS | Status: DC
  Administered 2018-01-22: 07:00:00 via INTRAVENOUS

## 2018-01-22 MED ORDER — FENTANYL CITRATE (PF) 100 MCG/2ML IJ SOLN
INTRAMUSCULAR | Status: DC | PRN
  Administered 2018-01-22: 50 ug via INTRAVENOUS

## 2018-01-22 MED ORDER — ONDANSETRON HCL 4 MG/2ML IJ SOLN
INTRAMUSCULAR | Status: DC | PRN
  Administered 2018-01-22: 4 mg via INTRAVENOUS

## 2018-01-22 MED ORDER — ADENOSINE (DIAGNOSTIC) 140MCG/KG/MIN
INTRAVENOUS | Status: DC | PRN
  Administered 2018-01-22: 140 ug/kg/min via INTRAVENOUS

## 2018-01-22 MED ORDER — SODIUM CHLORIDE 0.9 % IV SOLN: INTRAVENOUS | Status: AC

## 2018-01-22 MED ORDER — RANOLAZINE ER 500 MG PO TB12: 1000.0000 mg | ORAL_TABLET | Freq: Two times a day (BID) | ORAL | Status: DC

## 2018-01-22 MED ORDER — SODIUM CHLORIDE 0.9 % IV SOLN: 250.0000 mL | INTRAVENOUS | Status: DC | PRN

## 2018-01-22 MED ORDER — MIDAZOLAM HCL 2 MG/2ML IJ SOLN
INTRAMUSCULAR | Status: DC | PRN
  Administered 2018-01-22: 1 mg via INTRAVENOUS

## 2018-01-22 MED ORDER — HEPARIN (PORCINE) IN NACL 2-0.9 UNITS/ML
INTRAMUSCULAR | Status: AC | PRN
  Administered 2018-01-22: 1000 mL

## 2018-01-22 MED ORDER — HYDRALAZINE HCL 20 MG/ML IJ SOLN: 5.0000 mg | INTRAMUSCULAR | Status: DC | PRN

## 2018-01-22 MED ORDER — TICAGRELOR 90 MG PO TABS: 90.0000 mg | ORAL_TABLET | Freq: Two times a day (BID) | ORAL | Status: DC

## 2018-01-22 MED ORDER — ASPIRIN 81 MG PO CHEW: 81.0000 mg | CHEWABLE_TABLET | Freq: Every day | ORAL | Status: DC

## 2018-01-22 MED ORDER — IOPAMIDOL (ISOVUE-370) INJECTION 76%
INTRAVENOUS | Status: DC | PRN
  Administered 2018-01-22: 70 mL via INTRA_ARTERIAL

## 2018-01-22 MED ORDER — FENTANYL CITRATE (PF) 100 MCG/2ML IJ SOLN
INTRAMUSCULAR | Status: AC
  Filled 2018-01-22: qty 2

## 2018-01-22 MED ORDER — LABETALOL HCL 5 MG/ML IV SOLN: 10.0000 mg | INTRAVENOUS | Status: DC | PRN

## 2018-01-22 MED ORDER — ASPIRIN 81 MG PO CHEW: 81.0000 mg | CHEWABLE_TABLET | ORAL | Status: DC

## 2018-01-22 MED ORDER — BIVALIRUDIN BOLUS VIA INFUSION - CUPID
INTRAVENOUS | Status: DC | PRN
  Administered 2018-01-22: 54.45 mg via INTRAVENOUS

## 2018-01-22 MED ORDER — ACETAMINOPHEN 325 MG PO TABS: 650.0000 mg | ORAL_TABLET | ORAL | Status: DC | PRN

## 2018-01-22 MED ORDER — LIDOCAINE HCL (PF) 1 % IJ SOLN
INTRAMUSCULAR | Status: AC
  Filled 2018-01-22: qty 30

## 2018-01-22 MED ORDER — ADENOSINE 12 MG/4ML IV SOLN
INTRAVENOUS | Status: AC
  Filled 2018-01-22: qty 16

## 2018-01-22 MED ORDER — MIDAZOLAM HCL 2 MG/2ML IJ SOLN
INTRAMUSCULAR | Status: AC
  Filled 2018-01-22: qty 2

## 2018-01-22 MED ORDER — SODIUM CHLORIDE 0.9% FLUSH: 3.0000 mL | INTRAVENOUS | Status: DC | PRN

## 2018-01-22 MED ORDER — IOPAMIDOL (ISOVUE-370) INJECTION 76%
INTRAVENOUS | Status: AC
  Filled 2018-01-22: qty 125

## 2018-01-22 SURGICAL SUPPLY — 18 items
CATH INFINITI 5FR ANG PIGTAIL (CATHETERS) ×1 IMPLANT
CATH INFINITI JR4 5F (CATHETERS) ×1 IMPLANT
CATH LAUNCHER 6FR EBU3.5 (CATHETERS) ×1 IMPLANT
CATH MICROCATH NAVVUS (MICROCATHETER) IMPLANT
CATH OPTICROSS 40MHZ (CATHETERS) ×1 IMPLANT
COVER PRB 48X5XTLSCP FOLD TPE (BAG) IMPLANT
COVER PROBE 5X48 (BAG) ×2
KIT ENCORE 26 ADVANTAGE (KITS) ×1 IMPLANT
KIT HEART LEFT (KITS) ×2 IMPLANT
KIT MICROPUNCTURE NIT STIFF (SHEATH) ×1 IMPLANT
MICROCATHETER NAVVUS (MICROCATHETER) ×2
PACK CARDIAC CATHETERIZATION (CUSTOM PROCEDURE TRAY) ×2 IMPLANT
SHEATH PINNACLE 6F 10CM (SHEATH) ×1 IMPLANT
SLED PULL BACK IVUS (MISCELLANEOUS) ×1 IMPLANT
TRANSDUCER W/STOPCOCK (MISCELLANEOUS) ×2 IMPLANT
TUBING CIL FLEX 10 FLL-RA (TUBING) ×3 IMPLANT
WIRE EMERALD 3MM-J .035X150CM (WIRE) ×1 IMPLANT
WIRE SAMURAI STR TIP 190CM (WIRE) ×1 IMPLANT

## 2018-01-22 NOTE — Progress Notes (Signed)
Site area: rt groin fa sheath Site Prior to Removal:  Level 0 Pressure Applied For: 20 minutes Manual:   yes Patient Status During Pull:  stable Post Pull Site:  Level 0,  Bruising at stick site Post Pull Instructions Given:  yes Post Pull Pulses Present: rt pt palpable Dressing Applied:  Gauze and tegaderm Bedrest begins @ 1100 Comments:

## 2018-01-22 NOTE — Interval H&P Note (Signed)
History and Physical Interval Note:  01/22/2018 6:49 AM  Donna Bailey  has presented today for cardiac catheterization, with the diagnosis of coronary artery disease with stable angina. The various methods of treatment have been discussed with the patient and family. After consideration of risks, benefits and other options for treatment, the patient has consented to  Procedure(s): CORONARY STENT INTERVENTION (N/A) as a surgical intervention .  The patient's history has been reviewed, patient examined, no change in status, stable for surgery.  I have reviewed the patient's chart and labs.  Questions were answered to the patient's satisfaction.    Cath Lab Visit (complete for each Cath Lab visit)  Clinical Evaluation Leading to the Procedure:   ACS: No.  Non-ACS:    Anginal Classification: CCS III  Anti-ischemic medical therapy: Maximal Therapy (2 or more classes of medications)  Non-Invasive Test Results: No non-invasive testing performed (severe 2-vessel CAD by catheterization in 11/2017)  Prior CABG: No previous CABG  Donna Bailey

## 2018-01-22 NOTE — Brief Op Note (Signed)
BRIEF CARDIAC CATHETERIZATION NOTE  DATE: 01/22/2018 TIME: 8:53 AM  PATIENT:  Donna Bailey  72 y.o. female  PRE-OPERATIVE DIAGNOSIS:  Stable angina  POST-OPERATIVE DIAGNOSIS:  Same PROCEDURE:  Procedure(s): Intravascular Ultrasound/IVUS (N/A) INTRAVASCULAR PRESSURE WIRE/FFR STUDY (N/A) LEFT HEART CATH AND CORONARY ANGIOGRAPHY (N/A)  SURGEON:  Surgeon(s) and Role:    Nelva Bush, MD - Primary  FINDINGS: 1. Widely patent stent in mid LAD and stable moderate to severe disease involving branches of D1. 2. Non-obstructive mid RCA disease. 3. 60% ostial LCx stenosis with MLA 5.1 cm^2 by IVUS and FFR 0.85.  RECOMMENDATIONS: 1. LCx stenosis is not severe by angiography and IVUS; FFR is no hemodynamically significant.  Continue medical therapy. 2. Remove sheath in 2 hours and monitor for 6 hours post-sheath pull.  If no bleeding or vascular complication, can be discharged home this afternoon.  Nelva Bush, MD [facility] Pager: [phone]

## 2018-01-22 NOTE — Discharge Instructions (Signed)

## 2018-01-25 ENCOUNTER — Encounter (HOSPITAL_COMMUNITY): Payer: Self-pay | Admitting: Internal Medicine

## 2018-01-25 ENCOUNTER — Telehealth (HOSPITAL_COMMUNITY): Payer: Self-pay

## 2018-01-25 NOTE — Telephone Encounter (Signed)
Patient called in regards to Cardiac Rehab. Patient had heart cath done on 01/22/18. Her follow up is 02/11/18 with Dr.End. Explained to patient that once follow up appt has been completed, we will call to schedule. Patient verbalized understanding.

## 2018-01-28 ENCOUNTER — Other Ambulatory Visit: Payer: Self-pay | Admitting: Internal Medicine

## 2018-01-28 NOTE — Telephone Encounter (Signed)
Please review for refill, Thanks !  

## 2018-01-29 ENCOUNTER — Ambulatory Visit (INDEPENDENT_AMBULATORY_CARE_PROVIDER_SITE_OTHER): Payer: Medicare Other | Admitting: Pharmacist

## 2018-01-29 DIAGNOSIS — E782 Mixed hyperlipidemia: Secondary | ICD-10-CM

## 2018-01-29 NOTE — Progress Notes (Signed)
Patient ID: Donna Bailey                 DOB: [birth year]                    MRN: [id number]     HPI: Donna Bailey is a 72 y.o. female patient of Donna Bailey referred to lipid clinic by Donna Reedy, PA. PMH is significant for CAD s/p STEMI 11/13/17 treated with DES to prox/mid LAD, ischemic cardiomyopathy with LVEF 40-45% by 11/2017 echo, and fibromyalgia with many drug allergies and intolerances.  Pt states she took Repatha about 2 years ago but after 3 months developed tremors in her hands. She went to a neurologist who thought they were medication related, and her symptoms stopped when she discontinued the Repatha. She has now been back on therapy for 3 injections after her recent heart attack. Her labs were drawn shortly after her 2nd injection so do not accurately reflect full efficacy of Repatha (had only experienced a 32% LDL reduction at that time).  She took Crestor 20 years ago and developed myalgias and hip pain that resulted in pelvic surgery. She also took Welchol but does not recall her allergy to this. She has not tried any other statin medications.  Pt has many allergies which limit medication options. She develops itching with corn products, including corn-based medication fillers which are in most medications. Her husband takes atorvastatin and tolerates this well. Does not contain corn, although pravastatin seems to. She then stated she cannot take medications that contain magnesium stearate due to corn allergy. After explaining to her that this does not contain corn, she stated that she is allergic to magnesium and rubs her legs with magnesium oil since she cannot take oral magnesium. Of note, almost all of her current medications she takes contain magnesium stearate. She also has an allergy to polyester (itching) and has not started cardiac rehab yet because she states the workers there wear polyester and she cannot be close to them.  Current Medications: Repatha 140mg   Q2W Intolerances: rosuvastatin - muscle aches Risk Factors: CAD s/p MI and PCI LDL goal: 70mg /dL  Diet: Likes fish, vegetables, potatoes. Does not eat fried food but does like fast food. Drinks full fat milk, low fat yogurt.  Exercise: Minimal. Has not started cardiac rehab yet.  Family History: Heart disease in her father (passed away in his 72s) and mother (passed away in her 47s).  Social History: Denies tobacco, alcohol, and illicit drug use.  Labs: 01/06/18: TC 246, TG 153, HDL 47, LDL 168 (Repatha 140mg  Q2W - had done 2 shots at that time) 11/13/17: TC 322, TG 106, HDL 51, LDL 250 (no therapy)  Past Medical History:  Diagnosis Date  . Allergic rhinitis, cause unspecified   . Anemia   . Aneurysm of right conjunctiva    right eye   . Anxiety   . Asthma   . Barrett's esophagus   . CAD in native artery    a. 11/2017: STEMI s/p DES to prox-mid LAD; LCx stenosis managed medically. Case complicated by R forearm hematoma.  . Constipation   . Cystocele   . Deviated nasal septum   . Diaphragmatic hernia without mention of obstruction or gangrene   . Esophageal reflux   . Fibromyalgia   . H/O hiatal hernia   . Insomnia, unspecified   . Ischemic cardiomyopathy    a. EF 40-45% by echo 11/2017.  Marland Kitchen Kidney stones  hx of pt see Donna. Isabel Bailey  . Migraine   . Myalgia and myositis, unspecified   . Osteoarthrosis, unspecified whether generalized or localized, unspecified site   . Peripheral neuropathy   . Pneumonia    hx of  . Pure hypercholesterolemia   . Rectocele   . Scoliosis (and kyphoscoliosis), idiopathic   . Statin intolerance   . Temporomandibular joint disorders, unspecified   . Wheat allergy     Current Outpatient Medications on File Prior to Visit  Medication Sig Dispense Refill  . aspirin 81 MG chewable tablet Chew 1 tablet (81 mg total) by mouth daily. 30 tablet 11  . azithromycin (ZITHROMAX Z-PAK) 250 MG tablet Take 2 tabs today, then 1 daily until gone. (Patient  not taking: Reported on 01/18/2018) 6 each 0  . carvedilol (COREG) 6.25 MG tablet Take 3.125 mg by mouth 2 (two) times daily.  12  . diphenhydrAMINE (BENADRYL) 25 mg capsule Take 25 mg by mouth every 6 (six) hours as needed for itching or allergies.     Marland Kitchen esomeprazole (NEXIUM) 20 MG capsule Take 20 mg by mouth daily at 12 noon.     . Evolocumab (REPATHA SURECLICK) 140 MG/ML SOAJ Inject 1 Dose into the skin every 14 (fourteen) days.     Marland Kitchen guaiFENesin-codeine 100-10 MG/5ML syrup Take 5 mLs by mouth every 4 (four) hours as needed for cough.  1  . isosorbide mononitrate (IMDUR) 30 MG 24 hr tablet TAKE 1 TABLET BY MOUTH EVERY DAY 30 tablet 12  . nitroGLYCERIN (NITROSTAT) 0.4 MG SL tablet Place 1 tablet (0.4 mg total) under the tongue every 5 (five) minutes as needed for chest pain. 25 tablet 2  . ondansetron (ZOFRAN) 4 MG tablet Take 1 tablet (4 mg total) by mouth every 8 (eight) hours as needed for nausea or vomiting. 4 tablet 0  . ranolazine (RANEXA) 500 MG 12 hr tablet TAKE 1 TABLET BY MOUTH TWICE A DAY 60 tablet 12  . ticagrelor (BRILINTA) 90 MG TABS tablet Take 1 tablet (90 mg total) by mouth 2 (two) times daily. 60 tablet 11  . zolpidem (AMBIEN) 10 MG tablet Take 1 tablet (10 mg total) by mouth at bedtime. (Patient taking differently: Take 10 mg by mouth at bedtime as needed for sleep. ) 30 tablet 5   No current facility-administered medications on file prior to visit.     Allergies  Allergen Reactions  . Sulfa Antibiotics Anaphylaxis  . Sulfonamide Derivatives Anaphylaxis  . Penicillins Rash    Underarms (both) Has patient had a PCN reaction causing immediate rash, facial/tongue/throat swelling, SOB or lightheadedness with hypotension: YES Has patient had a PCN reaction causing severe rash involving mucus membranes or skin necrosis: NO Has patient had a PCN reaction that required hospitalization NO Has patient had a PCN reaction occurring within the last 10 years: NO If all of the above  answers are "NO", then may proceed with Cephalosporin use.  Donna Bailey [Fexofenadine]     Heart race  . Avelox [Moxifloxacin]     Doesn't remember  . Caffeine     Heart race  . Cephalexin Other (See Comments)    unknown  . Ciprofloxacin Other (See Comments)    unknown  . Corn-Containing Products Itching and Other (See Comments)    Can not walk if have a lot of it.  . Crestor [Rosuvastatin] Other (See Comments)    Lost all muscle mobility   . Doxycycline Diarrhea and Nausea And Vomiting  . Formoterol  Fumarate Other (See Comments)    unknown  . Latex     Gets red   . Levofloxacin     REACTION: HEART RACING  . Macrodantin [Nitrofurantoin Macrocrystal] Itching  . Neomycin Sulfate [Neomycin]     Doesn't remember  . Ofloxacin Itching  . Other     Corn fillers, corn by-products - causes severe itching Polyester-"pins sticking in her skin" Gold  . Adhesive [Tape] Rash  . Ibuprofen Rash  . Mold Extract [Trichophyton Mentagrophyte] Other (See Comments)    Bumps on back, stops up sinuses.  . Nickel Rash  . Tylenol [Acetaminophen] Rash    On face    Assessment/Plan:  1. Hyperlipidemia - LDL 250 >> 168 on Repatha therapy however labs were drawn after pt had only completed 2 injections which explains suboptimal 32% LDL reduction. Will check lipids next week after she has completed 4 injections. LDL goal < 70 due to recent STEMI. Multitude of allergies are limiting drug options since corn filler is used in almost all medications. Discussed trying low dose atorvastatin (her husband takes this and tolerates it well) or ezetimibe, however she prefers to bring rx to her pharmacy so they can check on inactive ingredients for the particular drug manufacturer they use. She mentions allergy to magnesium stearate, although this is an ingredient in almost all of the medications she is currently taking. Will hopefully be able to convince pt to try low dose atorvastatin or ezetimibe after she checks  with her pharmacy. She previously experienced tremors in her hands after 3 months of Repatha therapy and reports they are slowly returning. Advised pt we can try her on Praluent samples after labs are drawn next week to see if she tolerates this better.   Cynthea Zachman E. Allyse Fregeau, PharmD, BCACP, CPP [facility] [address] [place], [place] [address] Phone: [phone]; Fax: [phone] 01/29/2018 12:14 PM

## 2018-01-29 NOTE — Patient Instructions (Addendum)
We will check a fasting cholesterol test next Wednesday, July 3rd. Come in any time after 7:30am  Check with your retail pharmacist to see if either the atorvastatin or ezetimibe contain corn - we would like to start one of these medications in addition to your Repatha  We can try switching to you to Praluent injections instead of Repatha to see if this avoids tremors

## 2018-02-01 DIAGNOSIS — M7541 Impingement syndrome of right shoulder: Secondary | ICD-10-CM | POA: Insufficient documentation

## 2018-02-02 ENCOUNTER — Telehealth: Payer: Self-pay | Admitting: Internal Medicine

## 2018-02-02 ENCOUNTER — Other Ambulatory Visit: Payer: Self-pay

## 2018-02-02 MED ORDER — RANOLAZINE ER 1000 MG PO TB12: 1000.0000 mg | ORAL_TABLET | Freq: Two times a day (BID) | ORAL | 3 refills | Status: DC

## 2018-02-02 NOTE — Telephone Encounter (Signed)
New message:       Pt c/o medication issue:  1. Name of Medication: ranolazine (RANEXA) 500 MG 12 hr tablet  2. How are you currently taking this medication (dosage and times per day)? TAKE 1 TABLET BY MOUTH TWICE A DAY  3. Are you having a reaction (difficulty breathing--STAT)? No  4. What is your medication issue? Pt states she was told to increase this medication to 1000 mg in the am and 1000 in the pm. Pt's pharmacy is needing a new prescription if this is the dosage she should be taken.

## 2018-02-02 NOTE — Telephone Encounter (Signed)
Yes, please send in new prescription for Ranexa 1000 mg BID.  Thanks.  Nelva Bush, MD [facility] Pager: [phone]

## 2018-02-02 NOTE — Telephone Encounter (Signed)
Pt calling stating that Dr. Saunders Revel stated at [facility] that he would send in a Rx for Ranolazine ( Ranexa) 1000 mg tablet. Pt medication was sent in as Ranolazine 500 mg tablet. Pt would like a call back concerning this matter. Please address

## 2018-02-02 NOTE — Telephone Encounter (Signed)
Spoke to the patient and informed her that Dr End confirmed her Ranexa prescription of 1000 mg bid.  She verbalized understanding and was pleased to hear from [place].

## 2018-02-03 ENCOUNTER — Other Ambulatory Visit: Payer: Medicare Other

## 2018-02-08 ENCOUNTER — Other Ambulatory Visit: Payer: Medicare Other | Admitting: *Deleted

## 2018-02-08 DIAGNOSIS — E782 Mixed hyperlipidemia: Secondary | ICD-10-CM

## 2018-02-08 LAB — LIPID PANEL
Chol/HDL Ratio: 3.2 ratio (ref 0.0–4.4)
Cholesterol, Total: 172 mg/dL (ref 100–199)
HDL: 54 mg/dL (ref >39)
LDL Calculated: 88 mg/dL (ref 0–99)
Triglycerides: 152 mg/dL — ABNORMAL HIGH (ref 0–149)
VLDL CHOLESTEROL CAL: 30 mg/dL (ref 5–40)

## 2018-02-09 ENCOUNTER — Telehealth: Payer: Self-pay | Admitting: Pharmacist

## 2018-02-09 NOTE — Telephone Encounter (Signed)
Spoke with pt regarding results, LDL is much improved to 88 from baseline of 250 since starting Repatha injections (LDL drawn last month was checked prematurely after pt had only used 1 Repatha injection). LDL goal is < 70 due to ASCVD history. She states she is starting to notice some hand tremors which she experienced in the past on Repatha therapy. Will provide pt with a few Praluent samples when she comes in for her office visit with Dr End this week to see if she tolerates this better. Encouraged pt to bring in her atorvastatin and ezetimibe prescriptions to her pharmacy so they can check if their generic manufacturer uses corn-based fillers or if she can safely take either. Low dose of atorvastatin or ezetimibe in combination with PCSK9i therapy should bring LDL to goal < 70.

## 2018-02-11 ENCOUNTER — Encounter: Payer: Self-pay | Admitting: Internal Medicine

## 2018-02-11 ENCOUNTER — Ambulatory Visit (INDEPENDENT_AMBULATORY_CARE_PROVIDER_SITE_OTHER): Payer: Medicare Other | Admitting: Internal Medicine

## 2018-02-11 VITALS — BP 90/60 | HR 70 | Ht 66.0 in | Wt 166.6 lb

## 2018-02-11 DIAGNOSIS — I25118 Atherosclerotic heart disease of native coronary artery with other forms of angina pectoris: Secondary | ICD-10-CM | POA: Diagnosis not present

## 2018-02-11 DIAGNOSIS — E785 Hyperlipidemia, unspecified: Secondary | ICD-10-CM | POA: Diagnosis not present

## 2018-02-11 DIAGNOSIS — I255 Ischemic cardiomyopathy: Secondary | ICD-10-CM | POA: Diagnosis not present

## 2018-02-11 MED ORDER — ISOSORBIDE MONONITRATE ER 30 MG PO TB24: 15.0000 mg | ORAL_TABLET | Freq: Every day | ORAL | 3 refills | Status: DC
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
## 2018-02-11 NOTE — Patient Instructions (Addendum)
Medication Instructions:  Your physician has recommended you make the following change in your medication:  1. DECREASE your Imdur to 15 mg daily  * If you need a refill on your cardiac medications before your next appointment, please call your pharmacy.   Labwork: None ordered  Testing/Procedures: None ordered  Follow-Up: Your physician recommends that you schedule a follow-up appointment in: 1 month with Richardson Dopp, PA.   *Please note that any paperwork needing to be filled out by the provider will need to be addressed at the front desk prior to seeing the provider. Please note that any FMLA, disability or other documents regarding health condition is subject to a $25.00 charge that must be received prior to completion of paperwork in the form of a money order or check.  Thank you for choosing [facility]!!    Any Other Special Instructions Will Be Listed Below (If Applicable). Will determine when to establish with Dr. Meda Coffee at the office visit in 1 month with the PA.

## 2018-02-15 ENCOUNTER — Telehealth (HOSPITAL_COMMUNITY): Payer: Self-pay

## 2018-02-15 NOTE — Telephone Encounter (Signed)
Called patient to see if she was interested in participating in the Cardiac Rehab Program. Patient stated yes. Patient will come in for orientation on 03/25/18 @ 8:30AM and will attend the 9:45AM exercise class.  Mailed homework package.

## 2018-03-16 ENCOUNTER — Telehealth (HOSPITAL_COMMUNITY): Payer: Self-pay

## 2018-03-17 ENCOUNTER — Telehealth: Payer: Self-pay | Admitting: Pharmacist

## 2018-03-17 ENCOUNTER — Telehealth: Payer: Self-pay | Admitting: Cardiology

## 2018-03-17 ENCOUNTER — Telehealth: Payer: Self-pay | Admitting: Internal Medicine

## 2018-03-17 DIAGNOSIS — E8770 Fluid overload, unspecified: Secondary | ICD-10-CM

## 2018-03-17 MED ORDER — FUROSEMIDE 20 MG PO TABS: 20.0000 mg | ORAL_TABLET | Freq: Every day | ORAL | 3 refills | Status: DC

## 2018-03-17 MED ORDER — ALIROCUMAB 75 MG/ML ~~LOC~~ SOPN: 1.0000 "pen " | PEN_INJECTOR | SUBCUTANEOUS | 11 refills | Status: DC

## 2018-03-17 NOTE — Addendum Note (Signed)
Addended by: SUPPLE, MEGAN E on: 03/17/2018 02:32 PM   Modules accepted: Orders

## 2018-03-17 NOTE — Telephone Encounter (Signed)
[id number] 8/14

## 2018-03-17 NOTE — Telephone Encounter (Signed)
Patient is calling because she has developed edema in her bilateral ankles, right greater than left.  She has had surgery on the right ankle in the past so it is generally a little swollen, but this is much more.  It is causing her pain at the swollen sites.    She has been wrapping them but the wrap gets so tight that she has to cut them off.  She also has gained weight over the last week.  She was weighing 161, but today is at 168.  Please advise, thank you.

## 2018-03-17 NOTE — Telephone Encounter (Signed)
New Message    Pt c/o swelling: STAT is pt has developed SOB within 24 hours  1) How much weight have you gained and in what time span? 6lbs in a week    2) If swelling, where is the swelling located? legs  3) Are you currently taking a fluid pill? No    4) Are you currently SOB? no  5) Do you have a log of your daily weights (if so, list)? no  6) Have you gained 3 pounds in a day or 5 pounds in a week? 6 lbs in a week   7) Have you traveled recently? No

## 2018-03-17 NOTE — Telephone Encounter (Signed)
Prior authorization for Praluent has been approved. Rx sent to specialty pharmacy. Pt is aware.

## 2018-03-17 NOTE — Telephone Encounter (Signed)
Spoke with pt regarding Praluent tolerability - she denies any adverse effects like she did with the [place]. Will submit prior authorization for Praluent PA.

## 2018-03-17 NOTE — Telephone Encounter (Signed)
Let's have the patient start furosemide 20 mg daily and get a basic metabolic panel in about 1 week.  I am not sure which particular formulation of furosemide does not contain a corn-based filler, given her history of multiple allergies.  She will need to discuss this with her pharmacy.  She should follow-up as planned with Richardson Dopp on 03/30/2018.  Nelva Bush, MD [facility] Pager: [phone]

## 2018-03-17 NOTE — Telephone Encounter (Signed)
Patient came into office to pick up some medication so I informed her then of Dr Darnelle Bos recommendations.  She verbalized understanding and will come in for labs 8/21 and pick up lasix on the way home.

## 2018-03-19 NOTE — Telephone Encounter (Signed)
I was notified to contact the patient. She was recently started on furosemide by her primary cardiologist for LE edema, and she reports that she had injections of her neck/back yesterday by neurology. She reports that this afernoon she was experiencing double vision without any other neurologic symptoms. She states that she talked to her PCP, who said she could be having a stroke. She cannot tell me what recommendations for workup/treatment he recommended.  She reports that her symptoms lasted for several hours and that she went to bed at 7 pm. She woke up at 11 PM with resolution of her symptoms. She asks me for any recommendations in changes in her medications.  I told her that I am unable to provide an adequate assessment of her symptoms over the phone. Due to described concern by her PCP for possible stroke, particularly in the setting of recent neck/spine injection, I urged her to go to the ED for further evaluation.

## 2018-03-20 ENCOUNTER — Emergency Department (HOSPITAL_COMMUNITY): Payer: Medicare Other

## 2018-03-20 ENCOUNTER — Encounter (HOSPITAL_COMMUNITY): Payer: Self-pay | Admitting: Emergency Medicine

## 2018-03-20 ENCOUNTER — Other Ambulatory Visit: Payer: Self-pay

## 2018-03-20 ENCOUNTER — Emergency Department (HOSPITAL_COMMUNITY)
Admission: EM | Admit: 2018-03-20 | Discharge: 2018-03-20 | Disposition: A | Discharge status: Home / Self Care | Payer: Medicare Other | Attending: Emergency Medicine | Admitting: Emergency Medicine

## 2018-03-20 DIAGNOSIS — Z955 Presence of coronary angioplasty implant and graft: Secondary | ICD-10-CM | POA: Insufficient documentation

## 2018-03-20 DIAGNOSIS — I252 Old myocardial infarction: Secondary | ICD-10-CM | POA: Diagnosis not present

## 2018-03-20 DIAGNOSIS — H532 Diplopia: Secondary | ICD-10-CM | POA: Diagnosis not present

## 2018-03-20 DIAGNOSIS — Z79899 Other long term (current) drug therapy: Secondary | ICD-10-CM | POA: Diagnosis not present

## 2018-03-20 DIAGNOSIS — J45909 Unspecified asthma, uncomplicated: Secondary | ICD-10-CM | POA: Diagnosis not present

## 2018-03-20 DIAGNOSIS — R51 Headache: Secondary | ICD-10-CM | POA: Diagnosis not present

## 2018-03-20 DIAGNOSIS — Z91040 Latex allergy status: Secondary | ICD-10-CM | POA: Diagnosis not present

## 2018-03-20 DIAGNOSIS — I2510 Atherosclerotic heart disease of native coronary artery without angina pectoris: Secondary | ICD-10-CM | POA: Insufficient documentation

## 2018-03-20 DIAGNOSIS — Z7982 Long term (current) use of aspirin: Secondary | ICD-10-CM | POA: Insufficient documentation

## 2018-03-20 LAB — COMPREHENSIVE METABOLIC PANEL
ALK PHOS: 61 U/L (ref 38–126)
ALT: 22 U/L (ref 0–44)
AST: 30 U/L (ref 15–41)
Albumin: 3.6 g/dL (ref 3.5–5.0)
Anion gap: 11 (ref 5–15)
BILIRUBIN TOTAL: 0.9 mg/dL (ref 0.3–1.2)
BUN: 24 mg/dL — AB (ref 8–23)
CALCIUM: 8.9 mg/dL (ref 8.9–10.3)
CO2: 21 mmol/L — ABNORMAL LOW (ref 22–32)
CREATININE: 1.18 mg/dL — AB (ref 0.44–1.00)
Chloride: 107 mmol/L (ref 98–111)
GFR calc Af Amer: 52 mL/min — ABNORMAL LOW (ref >60)
GFR calc non Af Amer: 45 mL/min — ABNORMAL LOW (ref >60)
Glucose, Bld: 132 mg/dL — ABNORMAL HIGH (ref 70–99)
Potassium: 5 mmol/L (ref 3.5–5.1)
Sodium: 139 mmol/L (ref 135–145)
TOTAL PROTEIN: 6.7 g/dL (ref 6.5–8.1)

## 2018-03-20 LAB — DIFFERENTIAL
ABS IMMATURE GRANULOCYTES: 0.1 10*3/uL (ref 0.0–0.1)
BASOS PCT: 0 %
Basophils Absolute: 0 10*3/uL (ref 0.0–0.1)
EOS ABS: 0 10*3/uL (ref 0.0–0.7)
Eosinophils Relative: 0 %
Immature Granulocytes: 0 %
Lymphocytes Relative: 6 %
Lymphs Abs: 0.9 10*3/uL (ref 0.7–4.0)
MONO ABS: 0.8 10*3/uL (ref 0.1–1.0)
MONOS PCT: 6 %
Neutro Abs: 12.4 10*3/uL — ABNORMAL HIGH (ref 1.7–7.7)
Neutrophils Relative %: 88 %

## 2018-03-20 LAB — CBG MONITORING, ED: Glucose-Capillary: 131 mg/dL — ABNORMAL HIGH (ref 70–99)

## 2018-03-20 LAB — I-STAT TROPONIN, ED: Troponin i, poc: 0.01 ng/mL (ref 0.00–0.08)

## 2018-03-20 LAB — CBC
HCT: 38.1 % (ref 36.0–46.0)
Hemoglobin: 12.8 g/dL (ref 12.0–15.0)
MCH: 32.3 pg (ref 26.0–34.0)
MCHC: 33.6 g/dL (ref 30.0–36.0)
MCV: 96.2 fL (ref 78.0–100.0)
PLATELETS: 307 10*3/uL (ref 150–400)
RBC: 3.96 MIL/uL (ref 3.87–5.11)
RDW: 15.1 % (ref 11.5–15.5)
WBC: 14.1 10*3/uL — ABNORMAL HIGH (ref 4.0–10.5)

## 2018-03-20 LAB — APTT: APTT: 23 s — AB (ref 24–36)

## 2018-03-20 LAB — PROTIME-INR
INR: 0.9
PROTHROMBIN TIME: 12.1 s (ref 11.4–15.2)
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
## 2018-03-20 NOTE — ED Notes (Signed)
D/c reviewed with patient 

## 2018-03-20 NOTE — ED Triage Notes (Signed)
PT arrived to triage stating she was having "stroke like symptoms."  Reports double vision throughout the day that was worse around 7pm but is gone now.  Also reports intermittent headache x 1 week.  Seen by PCP on Wednesday for "swollen legs" and started on Lasix.  No arm drift.  Speech clear.  No neuro deficits noted on triage exam.  VAN negative.

## 2018-03-22 NOTE — Telephone Encounter (Signed)
Cardiac Rehab Medication Review by a Pharmacist  Does the patient  feel that his/her medications are working for him/her?  Yes   Has the patient been experiencing any side effects to the medications prescribed?  Yes - has been to the ED recently due to stroke like symptoms - all happened when she started Lasix. She has since d/c'ed Lasix and will discuss with PCP when she goes 03/30/18  Does the patient measure his/her own blood pressure or blood glucose at home?  no   Does the patient have any problems obtaining medications due to transportation or finances?   No - but starts Praluent soon so worried that may be expensive. Counseled patient to let the PharmD at [facility] know if prescription to expensive so they can evaluate for possible patient assistance programs.   Understanding of regimen: good Understanding of indications: good Potential of compliance: good    Pharmacist comments: Patient reports bruising. Counseled patient that some bruising in normal with Brilinta and ASA but if she has large bruises, bruises that do not improve in presentation or get worse, or unexplained bruising to let her PCP know. She expresses some concerns that she will be unable to use CV Rehab due to being unable to walk on hard surfaces and having right shoulder pain. Counseled patient that CV Rehab will work with her activity level and ability so she can comfortably do exercises.     Isaias Sakai, Sherian Rein D PGY1 Pharmacy Resident  Phone [phone] 03/22/2018      5:57 PM

## 2018-03-23 ENCOUNTER — Telehealth (HOSPITAL_COMMUNITY): Payer: Self-pay | Admitting: Cardiac Rehabilitation

## 2018-03-23 NOTE — Telephone Encounter (Signed)
-----   Message from Nelva Bush, MD sent at 03/23/2018  1:17 PM EDT ----- Regarding: RE: cardiac rehab  The patient has multiple chronic issues.  I think it is fine for her to begin cardiac rehab.  Thanks.  Chris End  ----- Message ----- From: Lowell Guitar, RN Sent: 03/23/2018  12:06 PM EDT To: Nelva Bush, MD Subject: cardiac rehab                                  Dear Dr. Saunders Revel,   Pt is scheduled for cardiac rehab orientation Thursday 03/25/18. On 03/20/2018 she was evaluated in ED for headache with diplopia.  Workup was unremarkable with instructions to followup with her neurologist.    Is it ok for her to start cardiac rehab?  Thank you, Andi Hence, RN, BSN Cardiac Pulmonary Rehab

## 2018-03-24 ENCOUNTER — Other Ambulatory Visit: Payer: Medicare Other | Admitting: *Deleted

## 2018-03-24 DIAGNOSIS — E8770 Fluid overload, unspecified: Secondary | ICD-10-CM

## 2018-03-24 LAB — BASIC METABOLIC PANEL
BUN / CREAT RATIO: 21 (ref 12–28)
BUN: 21 mg/dL (ref 8–27)
CO2: 19 mmol/L — ABNORMAL LOW (ref 20–29)
CREATININE: 0.99 mg/dL (ref 0.57–1.00)
Calcium: 9.2 mg/dL (ref 8.7–10.3)
Chloride: 101 mmol/L (ref 96–106)
GFR calc Af Amer: 66 mL/min/{1.73_m2} (ref >59)
GFR, EST NON AFRICAN AMERICAN: 58 mL/min/{1.73_m2} — AB (ref >59)
Glucose: 87 mg/dL (ref 65–99)
Potassium: 4.7 mmol/L (ref 3.5–5.2)
SODIUM: 136 mmol/L (ref 134–144)

## 2018-03-25 ENCOUNTER — Encounter (HOSPITAL_COMMUNITY)
Admission: RE | Admit: 2018-03-25 | Discharge: 2018-03-25 | Disposition: A | Discharge status: Home / Self Care | Payer: Medicare Other | Source: Ambulatory Visit | Attending: Internal Medicine | Admitting: Internal Medicine

## 2018-03-25 ENCOUNTER — Telehealth (HOSPITAL_COMMUNITY): Payer: Self-pay | Admitting: *Deleted

## 2018-03-25 DIAGNOSIS — Z955 Presence of coronary angioplasty implant and graft: Secondary | ICD-10-CM

## 2018-03-25 DIAGNOSIS — M159 Polyosteoarthritis, unspecified: Secondary | ICD-10-CM

## 2018-03-25 DIAGNOSIS — I213 ST elevation (STEMI) myocardial infarction of unspecified site: Secondary | ICD-10-CM

## 2018-03-25 DIAGNOSIS — R5381 Other malaise: Secondary | ICD-10-CM

## 2018-03-25 NOTE — Progress Notes (Signed)
Pt in today for cardiac rehab orientation.  Due to pt "sensitivity" to polyester, staff unable to touch pt to place cardiac monitor. Pt complained of burning on her skin, prickly feeling in her lungs when she entered a carpeted area. Lengthy discussion with pt regarding the barriers to participating in group setting that are beyond our control. Such as clothing other participants wear and when the clothing was laundered. Pt came to the realization after seeing the gym and talking with rehab staff that this would not be a good situation for pt.  Advised pt that physical therapy would be a more appropriate setting for pt for exercise.  Conferred with Dr. Saunders Revel, he is in agreement of this.  Called outpatient rehab on [address] Pt preferred location due to her husband is receiving therapy [address], for their referral process and anticipated contact time. Called and spoke to [place], she is in agreement and verbalized understanding. Maurice Small RN, BSN Cardiac and Pulmonary Rehab Nurse Navigator

## 2018-03-25 NOTE — Telephone Encounter (Signed)
-----   Message from Nelva Bush, MD sent at 03/25/2018 10:32 AM EDT ----- Regarding: RE: Physical therapy verses cardiac rehab Hello Ms. Wilber Oliphant,  Thank you for the update.  If there is a possibility of doing 1 on 1 therapy with Ms. Jerde, that would be great.  Unfortunately, I think anxiety and other underlying psychiatric issues are the biggest barrier for her.  Working in a more quite and individualized setting may help minimize some of her issues.  If you need [place] to place a referral for anything, please let me know.  Gerald Stabs  ----- Message ----- From: Rowe Pavy, RN Sent: 03/25/2018   9:41 AM EDT To: Nelva Bush, MD Subject: Physical therapy verses cardiac rehab          Dr. Saunders Revel,  The above pt was in this morning for cardiac rehab orientation.  Our group setting was uncomfortable for this patient due to her sensitivity to polyester.  Pt complained of burning sensation to her skin and feeling as though she had "spikes" in her lung.  I removed her from the gym and had a conversation with her in regards to what we can offer and what we did not have control over in a large group setting.  After much discussion and reiterating that we could not control what other participants wore and when they  laundered the clothing. Pt conceded that this would not be a good fit for her.  I mentioned to her outpatient rehab as a possibility due to her orthopedic challenges with her ankle, arthritis and bone spurs. Pt desires to increase her strength and stamina.  Quite frankly she has not done any exercise since discharge in April. They would be able to work with her in a more one on one setting with less populated gym area.   Would you supportive of this?  She would not wear a cardiac monitor but since her stent was in April this may not be warranted at this point.   What are your thoughts?? Cherre Huger, BSN Cardiac and Training and development officer

## 2018-03-26 ENCOUNTER — Telehealth: Payer: Self-pay

## 2018-03-26 NOTE — Telephone Encounter (Signed)
-----   Message from Nelva Bush, MD sent at 03/26/2018 10:55 AM EDT ----- Please let Donna Bailey know that her labs are stable with creatinine back to baseline.  She should continue her current medications and follow up as previously discussed.

## 2018-03-26 NOTE — Telephone Encounter (Signed)
Notes recorded by Frederik Schmidt, RN on 03/26/2018 at 12:47 PM EDT Informed patient of results/recommendations. She verbalized understanding and thanked [place] for the call.

## 2018-03-26 NOTE — Telephone Encounter (Signed)
-----   Message from Nelva Bush, MD sent at 03/26/2018 10:55 AM EDT ----- Please let Ms. Voller know that her labs are stable with creatinine back to baseline.  She should continue her current medications and follow up as previously discussed.

## 2018-03-26 NOTE — Telephone Encounter (Signed)
Notes recorded by Frederik Schmidt, RN on 03/26/2018 at 11:33 AM EDT [id number] 8/23 ------

## 2018-03-29 ENCOUNTER — Ambulatory Visit (HOSPITAL_COMMUNITY): Payer: Medicare Other

## 2018-03-30 ENCOUNTER — Ambulatory Visit (INDEPENDENT_AMBULATORY_CARE_PROVIDER_SITE_OTHER): Payer: Medicare Other | Admitting: Physician Assistant

## 2018-03-30 ENCOUNTER — Encounter: Payer: Self-pay | Admitting: Physician Assistant

## 2018-03-30 VITALS — BP 110/70 | HR 63 | Ht 66.0 in | Wt 170.0 lb

## 2018-03-30 DIAGNOSIS — I25118 Atherosclerotic heart disease of native coronary artery with other forms of angina pectoris: Secondary | ICD-10-CM | POA: Diagnosis not present

## 2018-03-30 DIAGNOSIS — M7989 Other specified soft tissue disorders: Secondary | ICD-10-CM

## 2018-03-30 DIAGNOSIS — I255 Ischemic cardiomyopathy: Secondary | ICD-10-CM | POA: Diagnosis not present

## 2018-03-30 DIAGNOSIS — E785 Hyperlipidemia, unspecified: Secondary | ICD-10-CM | POA: Diagnosis not present

## 2018-03-30 DIAGNOSIS — K9049 Malabsorption due to intolerance, not elsewhere classified: Secondary | ICD-10-CM

## 2018-03-30 NOTE — Progress Notes (Signed)
Cardiology Office Note:    Date:  03/30/2018   ID:  Donna Bailey, DOB [birth year], MRN [id number]  PCP:  [name], MD  Cardiologist:  Nelva Bush, MD   Referring MD: [name], MD   Chief Complaint  Patient presents with  . Follow-up    CAD    History of Present Illness:    Donna Bailey is a 72 y.o. female with coronary artery disease, ischemic cardiomyopathy acid reflux, Barrett's esophagus, hyperlipidemia, asthma, multiple medication intolerances/allergies.  She was admitted in April 2019 with ST elevation myocardial infarction.  Cardiac catheterization demonstrated 100% thrombotic occlusion of the proximal LAD treated with a drug-eluting stent.  There is also severe stenosis in the first diagonal and ostial LCx.  Cardiac catheterization was complicated by radial artery dissection and hematoma.  She was evaluated by vascular surgery and conservative management was pursued.  EF at the time of her myocardial infarction was 40-45%.  She was readmitted in May 2019 with recurrent angina.  Her symptoms improved with addition of isosorbide and ranolazine.  Limited echo at that time demonstrated no new wall motion abnormalities and improved LV function with EF 65%.  She underwent cardiac catheterization in June 2019 due to continued anginal symptoms.  This demonstrated a patent stent in the LAD with moderate to severe disease involving the branches of the first diagonal and moderate nonobstructive disease in the RCA.  The diagonal vessel is relatively small and not a good target for PCI.  There was ostial 60% stenosis in the LCx which was not hemodynamically significant by FFR.  Medical therapy was recommended.  She was previously followed by Dr. Wynonia Lawman but transitioned to Dr. Saunders Revel.  She was last seen by Dr. Saunders Revel in July 2019.  She noted improved symptoms on higher dose ranolazine.  She did report tremors and her Repatha was discontinued.  She was started on Praluent.  It  was noted that if she has intolerance to Praluent, corn-free atorvastatin and ezetimibe could be obtained.  The patient did request long-term follow-up with Dr. Meda Coffee given Dr. Darnelle Bos transition to [facility].    Donna Bailey returns for follow-up.  She is here with her husband.  She went to the emergency room 03/20/2018 with diplopia.  Head CT and brain MRI were both unremarkable.  Her labs did indicate mildly elevated BUN and creatinine.  Since that time, she has stopped taking Lasix.  She has not had further symptoms.  She does note some swelling in her legs.  She has not had any further chest discomfort.  She denies significant shortness of breath.  She denies syncope.  Prior CV studies:   The following studies were reviewed today:  Cardiac catheterization 01/22/2018 Conclusions: 1. Widely patent stent in mid LAD and stable moderate to severe disease involving branches of D1. 2. Moderate, non-obstructive mid RCA disease. 3. 60% ostial LCx stenosis with minimal luminal area of 5.1 cm^2 by IVUS and FFR 0.85.  Recommendations: 1. LCx stenosis is not severe by angiography and IVUS; FFR is not hemodynamically significant. Continue medical therapy for LCx and diagonal disease.  I will increase ranolazine to 1000 mg twice daily. 2. Aggressive secondary prevention, including continuation of at least 12 months of DAPT following STEMI in 11/2017.  If dyspnea remains problematic, the patient could be transitioned from ticagrelor to clopidogrel. 3. Continue Repatha for lipid therapy. 4.  Diagnostic Diagram    Echo 12/04/2017 No new wall motion abnormalities, EF 65, no pericardial effusion,  normal aortic root  Echo 11/14/17 EF 40-45, apical ant-sept, inf, inf-sept, apical HK c/w ischemia in the distribution of the LAD  Cardiac Catheterization 11/13/17 LM normal LAD prox 100, D1 80, Lat D1 50 LCx ost 90 RCA mid 25 PCI:  2.75 x 23 mm Sierra DES to LAD           Past Medical History:  Diagnosis  Date  . Allergic rhinitis, cause unspecified   . Anemia   . Aneurysm of right conjunctiva    right eye   . Anxiety   . Asthma   . Barrett's esophagus   . CAD in native artery    a. 11/2017: STEMI s/p DES to prox-mid LAD; LCx stenosis managed medically. Case complicated by R forearm hematoma.  . Constipation   . Cystocele   . Deviated nasal septum   . Diaphragmatic hernia without mention of obstruction or gangrene   . Esophageal reflux   . Fibromyalgia   . H/O hiatal hernia   . Insomnia, unspecified   . Ischemic cardiomyopathy    a. EF 40-45% by echo 11/2017.  Marland Kitchen Kidney stones    hx of pt see Dr. Risa Grill  . Migraine   . Myalgia and myositis, unspecified   . Osteoarthrosis, unspecified whether generalized or localized, unspecified site   . Peripheral neuropathy   . Pneumonia    hx of  . Pure hypercholesterolemia   . Rectocele   . Scoliosis (and kyphoscoliosis), idiopathic   . Statin intolerance   . Temporomandibular joint disorders, unspecified   . Wheat allergy    Surgical Hx: The patient  has a past surgical history that includes right heel repair; Ankle surgery; Tonsillectomy; Appendectomy; Knee arthroscopy (2011); Hand surgery; Temporomandibular joint surgery; Vaginal hysterectomy; Bladder suspension; Cystocele repair; endocele (11/2008); Eye surgery (12/2009); Nasal septum surgery; Inguinal hernia repair (10/08/2011); Upper gastrointestinal endoscopy; Colonoscopy w/ polypectomy; Dental surgery; Radiology with anesthesia (N/A, 04/07/2013); Colonoscopy; Polypectomy; ir generic historical (08/13/2016); ir generic historical (09/12/2016); ir generic historical (09/30/2016); LEFT HEART CATH AND CORONARY ANGIOGRAPHY (N/A, 11/13/2017); CORONARY STENT INTERVENTION (N/A, 11/13/2017); Intravascular Ultrasound/IVUS (N/A, 01/22/2018); INTRAVASCULAR PRESSURE WIRE/FFR STUDY (N/A, 01/22/2018); and LEFT HEART CATH AND CORONARY ANGIOGRAPHY (N/A, 01/22/2018).   Current Medications: Current Meds  Medication Sig   . Alirocumab (PRALUENT) 75 MG/ML SOPN Inject 1 pen into the skin every 14 (fourteen) days.  Marland Kitchen aspirin 81 MG chewable tablet Chew 1 tablet (81 mg total) by mouth daily.  Marland Kitchen atorvastatin (LIPITOR) 10 MG tablet Take 10 mg by mouth daily.  . carvedilol (COREG) 6.25 MG tablet Take 6.25 mg by mouth 2 (two) times daily with a meal.  . Cyanocobalamin (VITAMIN B-12 IJ) Inject 1,000 mg as directed every 30 (thirty) days.  . diphenhydrAMINE (BENADRYL) 25 mg capsule Take 25 mg by mouth every 6 (six) hours as needed for itching or allergies.   Marland Kitchen esomeprazole (NEXIUM) 20 MG capsule Take 20 mg by mouth daily at 12 noon.   . furosemide (LASIX) 20 MG tablet Take 1 tablet (20 mg total) by mouth daily.  Marland Kitchen guaiFENesin-codeine 100-10 MG/5ML syrup Take 5 mLs by mouth every 4 (four) hours as needed for cough.  . isosorbide mononitrate (IMDUR) 30 MG 24 hr tablet Take 0.5 tablets (15 mg total) by mouth daily.  . nitroGLYCERIN (NITROSTAT) 0.4 MG SL tablet Place 1 tablet (0.4 mg total) under the tongue every 5 (five) minutes as needed for chest pain.  Marland Kitchen ondansetron (ZOFRAN) 4 MG tablet Take 1 tablet (4 mg total)  by mouth every 8 (eight) hours as needed for nausea or vomiting.  . ranolazine (RANEXA) 1000 MG SR tablet Take 1 tablet (1,000 mg total) by mouth 2 (two) times daily.  . ticagrelor (BRILINTA) 90 MG TABS tablet Take 1 tablet (90 mg total) by mouth 2 (two) times daily.  Marland Kitchen zolpidem (AMBIEN) 10 MG tablet Take 1 tablet (10 mg total) by mouth at bedtime. (Patient taking differently: Take 10 mg by mouth at bedtime as needed for sleep. )     Allergies:   Sulfa antibiotics; Sulfonamide derivatives; Penicillins; Allegra [fexofenadine]; Avelox [moxifloxacin]; Caffeine; Cephalexin; Ciprofloxacin; Corn-containing products; Crestor [rosuvastatin]; Doxycycline; Formoterol fumarate; Gold-containing drug products; Latex; Levofloxacin; Macrodantin [nitrofurantoin macrocrystal]; Neomycin sulfate [neomycin]; Ofloxacin; Other; Adhesive  [tape]; Ibuprofen; Mold extract [trichophyton mentagrophyte]; Nickel; and Tylenol [acetaminophen]   Social History   Tobacco Use  . Smoking status: Never Smoker  . Smokeless tobacco: Never Used  Substance Use Topics  . Alcohol use: No    Alcohol/week: 0.0 standard drinks  . Drug use: No     Family Hx: The patient's family history includes Breast cancer in her sister; Cancer in her sister; Cancer (age of onset: 47) in her sister; Colitis in her mother; Colon polyps in her brother; Diverticulosis in her mother; Heart disease in her father and mother; Leukemia in her sister; Tuberculosis in her brother. There is no history of Colon cancer, Esophageal cancer, Rectal cancer, or Stomach cancer.  ROS:   Please see the history of present illness.    Review of Systems  Constitution: Positive for malaise/fatigue.  Eyes: Positive for visual disturbance.  Cardiovascular: Positive for leg swelling.  Hematologic/Lymphatic: Bruises/bleeds easily.  Musculoskeletal: Positive for joint swelling.  Neurological: Positive for headaches.   All other systems reviewed and are negative.   EKGs/Labs/Other Test Reviewed:    EKG:  EKG is not ordered today.    Recent Labs: 12/03/2017: B Natriuretic Peptide 370.5 03/20/2018: ALT 22; Hemoglobin 12.8; Platelets 307 03/24/2018: BUN 21; Creatinine, Ser 0.99; Potassium 4.7; Sodium 136   Recent Lipid Panel Lab Results  Component Value Date/Time   CHOL 172 02/08/2018 08:07 AM   TRIG 152 (H) 02/08/2018 08:07 AM   HDL 54 02/08/2018 08:07 AM   CHOLHDL 3.2 02/08/2018 08:07 AM   CHOLHDL 4.9 12/02/2017 09:33 PM   LDLCALC 88 02/08/2018 08:07 AM    Physical Exam:    VS:  BP 110/70   Pulse 63   Ht 5\' 6"  (1.676 m)   Wt 170 lb (77.1 kg)   SpO2 95%   BMI 27.44 kg/m     Wt Readings from Last 3 Encounters:  03/30/18 170 lb (77.1 kg)  03/20/18 164 lb (74.4 kg)  02/11/18 166 lb 9.6 oz (75.6 kg)     Physical Exam  Constitutional: She is oriented to person,  place, and time. She appears well-developed and well-nourished. No distress.  HENT:  Head: Normocephalic.  Eyes: No scleral icterus.  Neck: No JVD present. No thyromegaly present.  Cardiovascular: Normal rate and regular rhythm.  No murmur heard. Pulmonary/Chest: Effort normal. She has no rales.  Abdominal: Soft. There is no tenderness.  Musculoskeletal: She exhibits edema (bilat non-pitting edema; multiple varicose veins noted).  Neurological: She is alert and oriented to person, place, and time.  Skin: Skin is warm and dry.  Psychiatric: She has a normal mood and affect.    ASSESSMENT & PLAN:    Coronary artery disease of native artery of native heart with stable angina pectoris ([place]) History  of ST elevation myocardial infarction in April 2019 treated with drug-eluting stent to the LAD.  She has significant disease and a very small diagonal branches not amenable to PCI.  She has moderate nonobstructive disease in the LCx.  Cardiac catheterization in June 2019 demonstrated no hemodynamic significance by FFR in the LCx.  She also has moderate nonobstructive disease in the RCA.  She is currently doing well without significant angina on her current regimen which includes aspirin, Ticagrelor, carvedilol, isosorbide, ranolazine.  Continue current medical therapy.  She does have significant bruising.  We discussed the importance of dual antiplatelet therapy for 1 year post MI/PCI.  Ischemic cardiomyopathy EF recovered to 65% by most recent echocardiogram May 2019.  Continue beta-blocker.  No indication for ACE inhibitor at this time.  Hyperlipidemia LDL goal <70 She was intolerant to Repatha.  She is now on Praluent and low-dose atorvastatin.  Obtain follow-up lipids in 2 months.  Leg swelling She has evidence of venous insufficiency.  I suspect her double vision and weakness was related to dehydration from taking furosemide.  I have advised her to take furosemide only as needed.  I have  encouraged her to use compression stockings and elevation to manage her lower extremity swelling.  Food intolerance She has significant allergies to corn and corn fillers.  This will need to be taken into consideration for any medication adjustments in the future.   Dispo:  Return in about 3 months (around 06/30/2018) for Routine Follow Up w/ Dr. Meda Coffee.   Medication Adjustments/Labs and Tests Ordered: Current medicines are reviewed at length with the patient today.  Concerns regarding medicines are outlined above.  Tests Ordered: Orders Placed This Encounter  Procedures  . Lipid Profile  . Hepatic function panel   Medication Changes: No orders of the defined types were placed in this encounter.   Signed, Richardson Dopp, PA-C  03/30/2018 5:49 PM    [facility] [place], [place], [place]  [address] Phone: [phone]; Fax: [phone]

## 2018-03-30 NOTE — Patient Instructions (Signed)
Medication Instructions:  1. Your physician recommends that you continue on your current medications as directed. Please refer to the Current Medication list given to you today.   Labwork: FASTING LIPID AND LIVER PANEL TO BE DONE IN MONTHS  Testing/Procedures: NONE ORDERED TODAY  Follow-Up: DR. Meda Coffee IN 3 MONTHS   Any Other Special Instructions Will Be Listed Below (If Applicable).     If you need a refill on your cardiac medications before your next appointment, please call your pharmacy.

## 2018-03-31 ENCOUNTER — Ambulatory Visit (HOSPITAL_COMMUNITY): Payer: Medicare Other

## 2018-04-02 ENCOUNTER — Ambulatory Visit (HOSPITAL_COMMUNITY): Payer: Medicare Other

## 2018-04-07 ENCOUNTER — Other Ambulatory Visit: Payer: Self-pay

## 2018-04-07 ENCOUNTER — Ambulatory Visit (HOSPITAL_COMMUNITY): Payer: Medicare Other

## 2018-04-07 ENCOUNTER — Ambulatory Visit: Payer: Medicare Other | Attending: Internal Medicine | Admitting: Physical Therapy

## 2018-04-07 DIAGNOSIS — M545 Low back pain, unspecified: Secondary | ICD-10-CM

## 2018-04-07 DIAGNOSIS — M542 Cervicalgia: Secondary | ICD-10-CM | POA: Diagnosis present

## 2018-04-07 DIAGNOSIS — M25671 Stiffness of right ankle, not elsewhere classified: Secondary | ICD-10-CM

## 2018-04-07 DIAGNOSIS — M25571 Pain in right ankle and joints of right foot: Secondary | ICD-10-CM | POA: Diagnosis present

## 2018-04-07 DIAGNOSIS — G8929 Other chronic pain: Secondary | ICD-10-CM | POA: Diagnosis present

## 2018-04-07 DIAGNOSIS — R262 Difficulty in walking, not elsewhere classified: Secondary | ICD-10-CM | POA: Diagnosis present

## 2018-04-08 ENCOUNTER — Encounter: Payer: Self-pay | Admitting: Physical Therapy
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
## 2018-04-09 ENCOUNTER — Ambulatory Visit: Payer: Medicare Other | Admitting: Physical Therapy

## 2018-04-09 ENCOUNTER — Ambulatory Visit (HOSPITAL_COMMUNITY): Payer: Medicare Other

## 2018-04-09 ENCOUNTER — Encounter: Payer: Self-pay | Admitting: Physical Therapy

## 2018-04-09 DIAGNOSIS — M545 Low back pain, unspecified: Secondary | ICD-10-CM

## 2018-04-09 DIAGNOSIS — G8929 Other chronic pain: Secondary | ICD-10-CM

## 2018-04-09 DIAGNOSIS — M542 Cervicalgia: Secondary | ICD-10-CM

## 2018-04-09 DIAGNOSIS — M25671 Stiffness of right ankle, not elsewhere classified: Secondary | ICD-10-CM

## 2018-04-09 DIAGNOSIS — R262 Difficulty in walking, not elsewhere classified: Secondary | ICD-10-CM

## 2018-04-09 DIAGNOSIS — M25571 Pain in right ankle and joints of right foot: Secondary | ICD-10-CM
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
## 2018-04-12 ENCOUNTER — Ambulatory Visit (HOSPITAL_COMMUNITY): Payer: Medicare Other

## 2018-04-12 ENCOUNTER — Encounter: Payer: Self-pay | Admitting: Physical Therapy

## 2018-04-12 ENCOUNTER — Ambulatory Visit: Payer: Medicare Other | Admitting: Physical Therapy

## 2018-04-12 DIAGNOSIS — M25671 Stiffness of right ankle, not elsewhere classified: Secondary | ICD-10-CM

## 2018-04-12 DIAGNOSIS — M542 Cervicalgia: Secondary | ICD-10-CM

## 2018-04-12 DIAGNOSIS — M545 Low back pain: Principal | ICD-10-CM

## 2018-04-12 DIAGNOSIS — G8929 Other chronic pain: Secondary | ICD-10-CM

## 2018-04-12 DIAGNOSIS — R262 Difficulty in walking, not elsewhere classified: Secondary | ICD-10-CM

## 2018-04-12 DIAGNOSIS — M25571 Pain in right ankle and joints of right foot: Secondary | ICD-10-CM
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
## 2018-04-13 ENCOUNTER — Ambulatory Visit: Payer: Medicare Other | Admitting: Physical Therapy

## 2018-04-14 ENCOUNTER — Ambulatory Visit (HOSPITAL_COMMUNITY): Payer: Medicare Other

## 2018-04-15 ENCOUNTER — Encounter: Payer: Self-pay | Admitting: Pulmonary Disease

## 2018-04-15 ENCOUNTER — Ambulatory Visit (INDEPENDENT_AMBULATORY_CARE_PROVIDER_SITE_OTHER): Payer: Medicare Other | Admitting: Pulmonary Disease

## 2018-04-15 VITALS — BP 110/72 | HR 63 | Temp 98.5°F | Ht 66.5 in | Wt 169.2 lb

## 2018-04-15 DIAGNOSIS — J069 Acute upper respiratory infection, unspecified: Secondary | ICD-10-CM | POA: Diagnosis not present

## 2018-04-15 DIAGNOSIS — J209 Acute bronchitis, unspecified: Secondary | ICD-10-CM | POA: Diagnosis not present

## 2018-04-15 HISTORY — DX: Acute upper respiratory infection, unspecified: J06.9

## 2018-04-15 MED ORDER — AZITHROMYCIN 250 MG PO TABS: ORAL_TABLET | ORAL | 0 refills | Status: DC

## 2018-04-15 MED ORDER — PREDNISONE 10 MG PO TABS: ORAL_TABLET | ORAL | 0 refills | Status: DC

## 2018-04-15 MED ORDER — PREDNISONE 20 MG PO TABS: 20.0000 mg | ORAL_TABLET | Freq: Every day | ORAL | 0 refills | Status: DC

## 2018-04-15 NOTE — Progress Notes (Signed)
@Patient  ID: Donna Bailey, female    DOB: 1946-04-06, 72 y.o.   MRN: [id number]  Chief Complaint  Patient presents with  . Acute Visit    congestion / fatigue     Referring provider: [name], MD  HPI:  72 year old female never smoker followed in our office for bronchitis, allergic rhinitis, insomnia  PMH: GERD, CAD, MI, stent, Barrett's esophagus recent MI (12/15/17), Hyperlipidemia, intolerant of all corn products, multiple idiosyncratic medication reactions mostly nonallopathic Smoker/ Smoking History: Never Smoker  Maintenance:  none Pt of: Dr. [name]   Recent [facility] Encounters:   12/17/2017-office [facility] follow-up for MI which occurred in April/2019.  Patient reports bronchitis symptoms last week treated with Z-Pak.  Sputum is less green and decreased in amount.  No fever or less wheeze.  Tessalon Perles have not helped.  Patient is requesting promethazine codeine cough syrup.  Plan: finish Zpak, second prescription zpak given, script for cough meds  04/15/2018  - Visit   72 year old female patient presenting today for acute symptoms.  Patient reporting that she is had symptoms for the last 10 to 12 days of off-and-on subjective fevers and chills.  Patient reports a T-max of 60 yesterday on 04/14/18.  Patient reports occasional congestion, increased cough with green productive mucus.  Patient denies shortness of breath or orthopnea.  Patient denies hemoptysis.  Patient has significant list of allergies and reactions to previous medications.  Patient requesting that if she receives a prednisone prescription that it is a [facility].  As the only prednisone that does not contain corn/cornstarch/byproducts.     Tests:   12/03/2017-CT Angio-no evidence of pulmonary thromboembolism chronic changes related to old granulomatous disease right upper lobe calcified granuloma 11/13/2017-CT chest without contrast- chronic  stable pulmonary calcifications compatible with old granulomatous 12/04/2017-echocardiogram EF 65%  Chart Review:     Specialty Problems      Pulmonary Problems   HIATAL HERNIA   Seasonal and perennial allergic rhinitis   Asthma with bronchitis   Bronchitis, acute   Upper respiratory infection, acute      Allergies  Allergen Reactions  . Sulfa Antibiotics Anaphylaxis  . Sulfonamide Derivatives Anaphylaxis  . Penicillins Rash    Underarms (both) Has patient had a PCN reaction causing immediate rash, facial/tongue/throat swelling, SOB or lightheadedness with hypotension: YES Has patient had a PCN reaction causing severe rash involving mucus membranes or skin necrosis: NO Has patient had a PCN reaction that required hospitalization NO Has patient had a PCN reaction occurring within the last 10 years: NO If all of the above answers are "NO", then may proceed with Cephalosporin use.  Delma Freeze [Fexofenadine]     Heart race  . Avelox [Moxifloxacin]     Doesn't remember  . Caffeine     Heart race  . Cephalexin Other (See Comments)    unknown  . Ciprofloxacin Other (See Comments)    unknown  . Corn-Containing Products Itching and Other (See Comments)    Can not walk if have a lot of it.  . Crestor [Rosuvastatin] Other (See Comments)    Lost all muscle mobility   . Doxycycline Diarrhea and Nausea And Vomiting  . Formoterol Fumarate Other (See Comments)    unknown  . Gold-Containing Drug Products     Skin tingle   . Latex     Gets red   . Levofloxacin     REACTION: HEART RACING  . Macrodantin [Nitrofurantoin Macrocrystal] Itching  .  Neomycin Sulfate [Neomycin]     Doesn't remember - allergist said not to use it because it could add more allergies   . Ofloxacin Itching  . Other     Corn fillers, corn by-products - causes severe itching Polyester-"pins sticking in her skin" Gold  . Adhesive [Tape] Rash  . Ibuprofen Rash  . Mold Extract [Trichophyton Mentagrophyte] Other  (See Comments)    Bumps on back, stops up sinuses.  . Nickel Rash  . Tylenol [Acetaminophen] Rash    On face  >>> Reviewed allergies with patient.  Patient is okay to take Z-Pak.  Patient is taking this in the past.  Patient would like prednisone from [facility] if needed as discussed plan of corn byproducts.  Immunization History  Administered Date(s) Administered  . Influenza Split 05/05/2011, 05/20/2012, 05/04/2013  . Influenza Whole 05/04/2010  . Influenza, High Dose Seasonal PF 04/16/2016, 05/05/2017  . Influenza,inj,Quad PF,6+ Mos 05/04/2014, 04/17/2015  . Pneumococcal Conjugate-13 06/13/2014  . Pneumococcal Polysaccharide-23 05/04/2010   >>> Patient received flu vaccine in October/2019  Past Medical History:  Diagnosis Date  . Allergic rhinitis, cause unspecified   . Anemia   . Aneurysm of right conjunctiva    right eye   . Anxiety   . Asthma   . Barrett's esophagus   . CAD in native artery    a. 11/2017: STEMI s/p DES to prox-mid LAD; LCx stenosis managed medically. Case complicated by R forearm hematoma.  . Constipation   . Cystocele   . Deviated nasal septum   . Diaphragmatic hernia without mention of obstruction or gangrene   . Esophageal reflux   . Fibromyalgia   . H/O hiatal hernia   . Insomnia, unspecified   . Ischemic cardiomyopathy    a. EF 40-45% by echo 11/2017.  Marland Kitchen Kidney stones    hx of pt see Dr. Risa Grill  . Migraine   . Myalgia and myositis, unspecified   . Osteoarthrosis, unspecified whether generalized or localized, unspecified site   . Peripheral neuropathy   . Pneumonia    hx of  . Pure hypercholesterolemia   . Rectocele   . Scoliosis (and kyphoscoliosis), idiopathic   . Statin intolerance   . Temporomandibular joint disorders, unspecified   . Wheat allergy     Tobacco History: Social History   Tobacco Use  Smoking Status Never Smoker  Smokeless Tobacco Never Used   Counseling given: Yes  Continue not smoking  Outpatient  Encounter Medications as of 04/15/2018  Medication Sig  . Alirocumab (PRALUENT) 75 MG/ML SOPN Inject 1 pen into the skin every 14 (fourteen) days.  Marland Kitchen aspirin 81 MG chewable tablet Chew 1 tablet (81 mg total) by mouth daily.  Marland Kitchen atorvastatin (LIPITOR) 10 MG tablet Take 10 mg by mouth daily.  . carvedilol (COREG) 6.25 MG tablet Take 6.25 mg by mouth 2 (two) times daily with a meal.  . Cyanocobalamin (VITAMIN B-12 IJ) Inject 1,000 mg as directed every 30 (thirty) days.  . diphenhydrAMINE (BENADRYL) 25 mg capsule Take 25 mg by mouth every 6 (six) hours as needed for itching or allergies.   Marland Kitchen esomeprazole (NEXIUM) 20 MG capsule Take 20 mg by mouth daily at 12 noon.   . furosemide (LASIX) 20 MG tablet Take 1 tablet (20 mg total) by mouth daily.  Marland Kitchen guaiFENesin-codeine 100-10 MG/5ML syrup Take 5 mLs by mouth every 4 (four) hours as needed for cough.  . isosorbide mononitrate (IMDUR) 30 MG 24 hr tablet Take 0.5 tablets (  15 mg total) by mouth daily.  . nitroGLYCERIN (NITROSTAT) 0.4 MG SL tablet Place 1 tablet (0.4 mg total) under the tongue every 5 (five) minutes as needed for chest pain.  Marland Kitchen ondansetron (ZOFRAN) 4 MG tablet Take 1 tablet (4 mg total) by mouth every 8 (eight) hours as needed for nausea or vomiting.  . ranolazine (RANEXA) 1000 MG SR tablet Take 1 tablet (1,000 mg total) by mouth 2 (two) times daily.  . ticagrelor (BRILINTA) 90 MG TABS tablet Take 1 tablet (90 mg total) by mouth 2 (two) times daily.  Marland Kitchen zolpidem (AMBIEN) 10 MG tablet Take 1 tablet (10 mg total) by mouth at bedtime. (Patient taking differently: Take 10 mg by mouth at bedtime as needed for sleep. )  . azithromycin (ZITHROMAX) 250 MG tablet 500mg  (two tablets) today, then 250mg  (1 tablet) for the next 4 days  . predniSONE (DELTASONE) 20 MG tablet Take 1 tablet (20 mg total) by mouth daily with breakfast.  . [DISCONTINUED] predniSONE (DELTASONE) 10 MG tablet Take 2 tablets (20mg  total) daily for the next 5 days. Take in the AM with  food.   No facility-administered encounter medications on file as of 04/15/2018.      Review of Systems  Review of Systems  Constitutional: Positive for chills, fatigue and fever (t max - 99 on 04/14/18 per patient ). Negative for unexpected weight change.  HENT: Positive for sore throat (Slight, occasional), trouble swallowing (Sore throat) and voice change. Negative for congestion, ear pain, postnasal drip, sinus pressure and sinus pain.   Respiratory: Positive for cough (green productive mucous ). Negative for chest tightness, shortness of breath and wheezing.   Cardiovascular: Negative for chest pain and palpitations.  Gastrointestinal: Negative for blood in stool, diarrhea, nausea and vomiting.  Genitourinary: Negative for dysuria, frequency and urgency.  Musculoskeletal: Negative for arthralgias.  Skin: Negative for color change.  Allergic/Immunologic: Negative for environmental allergies and food allergies.  Neurological: Negative for dizziness, light-headedness and headaches.  Psychiatric/Behavioral: Negative for dysphoric mood. The patient is not nervous/anxious.   All other systems reviewed and are negative.    Physical Exam  BP 110/72 (BP Location: Left Arm, Cuff Size: Normal)   Pulse 63   Temp 98.5 F (36.9 C) (Oral)   Ht 5' 6.5" (1.689 m)   Wt 169 lb 3.2 oz (76.7 kg)   SpO2 99%   BMI 26.90 kg/m   Wt Readings from Last 5 Encounters:  04/15/18 169 lb 3.2 oz (76.7 kg)  03/30/18 170 lb (77.1 kg)  03/20/18 164 lb (74.4 kg)  02/11/18 166 lb 9.6 oz (75.6 kg)  01/22/18 160 lb (72.6 kg)    Physical Exam  Constitutional: She is oriented to person, place, and time and well-developed, well-nourished, and in no distress. No distress.  HENT:  Head: Normocephalic and atraumatic.  Right Ear: Hearing, external ear and ear canal normal.  Left Ear: Hearing, external ear and ear canal normal.  Nose: Mucosal edema and rhinorrhea present. Right sinus exhibits frontal sinus  tenderness (Slight). Right sinus exhibits no maxillary sinus tenderness. Left sinus exhibits frontal sinus tenderness (Slight). Left sinus exhibits no maxillary sinus tenderness.  Mouth/Throat: Uvula is midline. Posterior oropharyngeal erythema present. No oropharyngeal exudate.  TMs with effusion bilaterally without infection, postnasal drip  Eyes: Pupils are equal, round, and reactive to light.  Neck: Normal range of motion. Neck supple. No JVD present.  Cardiovascular: Normal rate, regular rhythm and normal heart sounds.  Pulmonary/Chest: Effort normal and breath sounds  normal. No accessory muscle usage. No respiratory distress. She has no decreased breath sounds. She has no wheezes. She has no rhonchi.  Musculoskeletal: Normal range of motion. She exhibits no edema.  Lymphadenopathy:       Head (right side): Tonsillar adenopathy present.       Head (left side): Tonsillar adenopathy present.    She has no cervical adenopathy.  Neurological: She is alert and oriented to person, place, and time. Gait normal.  Skin: Skin is warm and dry. She is not diaphoretic. No erythema.  Psychiatric: Mood, memory, affect and judgment normal.  Nursing note and vitals reviewed.     Lab Results:  CBC    Component Value Date/Time   WBC 14.1 (H) 03/20/2018 0105   RBC 3.96 03/20/2018 0105   HGB 12.8 03/20/2018 0105   HGB 14.5 01/19/2018 0945   HCT 38.1 03/20/2018 0105   HCT 43.8 01/19/2018 0945   PLT 307 03/20/2018 0105   PLT 308 01/19/2018 0945   MCV 96.2 03/20/2018 0105   MCV 95 01/19/2018 0945   MCH 32.3 03/20/2018 0105   MCHC 33.6 03/20/2018 0105   RDW 15.1 03/20/2018 0105   RDW 14.1 01/19/2018 0945   LYMPHSABS 0.9 03/20/2018 0105   MONOABS 0.8 03/20/2018 0105   EOSABS 0.0 03/20/2018 0105   BASOSABS 0.0 03/20/2018 0105    BMET    Component Value Date/Time   NA 136 03/24/2018 0845   K 4.7 03/24/2018 0845   CL 101 03/24/2018 0845   CO2 19 (L) 03/24/2018 0845   GLUCOSE 87 03/24/2018  0845   GLUCOSE 132 (H) 03/20/2018 0105   BUN 21 03/24/2018 0845   CREATININE 0.99 03/24/2018 0845   CALCIUM 9.2 03/24/2018 0845   GFRNONAA 58 (L) 03/24/2018 0845   GFRAA 66 03/24/2018 0845    BNP    Component Value Date/Time   BNP 370.5 (H) 12/03/2017 0800    ProBNP No results found for: PROBNP  Imaging: Ct Head Wo Contrast  Result Date: 03/20/2018 CLINICAL DATA:  Patient with stroke-like symptoms.  Double vision. EXAM: CT HEAD WITHOUT CONTRAST TECHNIQUE: Contiguous axial images were obtained from the base of the skull through the vertex without intravenous contrast. COMPARISON:  Brain CT 11/12/2017. FINDINGS: Brain: Ventricles and sulci are appropriate for patient's age. Bilateral basal ganglia calcifications. Unchanged calcifications within the left cerebellum corresponding to previously visualized developmental venous anomaly. Vascular: Internal carotid arterial vascular calcifications. Skull: Intact. Sinuses/Orbits: Paranasal sinuses are well aerated. Mastoid air cells are unremarkable. Orbits are unremarkable. Other: None. IMPRESSION: No acute intracranial process. Electronically Signed   By: Lovey Newcomer M.D.   On: 03/20/2018 02:36   Mr Brain Wo Contrast  Result Date: 03/20/2018 CLINICAL DATA:  [place] EXAM: MRI HEAD WITHOUT CONTRAST TECHNIQUE: Multiplanar, multiecho pulse sequences of the brain and surrounding structures were obtained without intravenous contrast. COMPARISON:  CT head 03/20/2018, MRI 08/26/2016 FINDINGS: Brain: Negative for acute infarct. Minimal chronic changes in the white matter. Ventricle size and cerebral volume normal. Negative for hemorrhage or mass. 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 Normal arterial flow voids at the base of the brain Skull and upper cervical spine: Negative Sinuses/Orbits: Mild mucosal edema paranasal sinuses. Bilateral cataract  surgery Other: None IMPRESSION: No acute abnormality and no change from prior studies. Electronically Signed   By: Franchot Gallo M.D.   On: 03/20/2018 07:36      Assessment & Plan:  Pleasant 72 year old patient seen office visit today.  Due to patient's length of symptoms will cover for a bacterial process.  Patient to be covered with Z-Pak if she is taken before.  As well as a short course of prednisone.  I explained to the patient that most likely this is a upper respiratory infection that is complicated her past history of bronchitis.  Patient is to follow-up with our office if symptoms worsen.  Patient to keep follow-up in October/2019.  Bronchitis, acute Azithromycin 250mg  tablet  >>>Take 2 tablets (500mg  total) today, and then 1 tablet (250mg ) for the next four days  >>>take with food  >>>can also take probiotic and / or yogurt while on antibiotic   Prednisone 20mg  tablet  >>>Take 1 tablets (20 mg total) daily for the next 5 days >>> Take with food in the morning  Keep follow-up with Dr.Young 05/05/18  Follow-up with our office sooner if symptoms are not improving  Read literature listed below on other ways to manage upper respiratory infections and bronchitis   Upper respiratory infection, acute Azithromycin 250mg  tablet  >>>Take 2 tablets (500mg  total) today, and then 1 tablet (250mg ) for the next four days  >>>take with food  >>>can also take probiotic and / or yogurt while on antibiotic   Prednisone 20mg  tablet  >>>Take 1 tablets (20 mg total) daily for the next 5 days >>> Take with food in the morning  Keep follow-up with Dr.Young 05/05/18  Follow-up with our office sooner if symptoms are not improving  Read literature listed below on other ways to manage upper respiratory infections and bronchitis      Lauraine Rinne, NP 04/15/2018

## 2018-04-15 NOTE — Assessment & Plan Note (Addendum)
Azithromycin 250mg  tablet  >>>Take 2 tablets (500mg  total) today, and then 1 tablet (250mg ) for the next four days  >>>take with food  >>>can also take probiotic and / or yogurt while on antibiotic   Prednisone 20mg  tablet  >>>Take 1 tablets (20 mg total) daily for the next 5 days >>> Take with food in the morning  Keep follow-up with Dr.Young 05/05/18  Follow-up with our office sooner if symptoms are not improving  Read literature listed below on other ways to manage upper respiratory infections and bronchitis

## 2018-04-15 NOTE — Assessment & Plan Note (Signed)
Azithromycin 250mg  tablet  >>>Take 2 tablets (500mg  total) today, and then 1 tablet (250mg ) for the next four days  >>>take with food  >>>can also take probiotic and / or yogurt while on antibiotic   Prednisone 20mg  tablet  >>>Take 1 tablets (20 mg total) daily for the next 5 days >>> Take with food in the morning  Keep follow-up with Dr.Young 05/05/18  Follow-up with our office sooner if symptoms are not improving  Read literature listed below on other ways to manage upper respiratory infections and bronchitis

## 2018-04-15 NOTE — Patient Instructions (Addendum)
Azithromycin 250mg  tablet  >>>Take 2 tablets (500mg  total) today, and then 1 tablet (250mg ) for the next four days  >>>take with food  >>>can also take probiotic and / or yogurt while on antibiotic   Prednisone 20mg  tablet  >>>Take 1 tablets (20 mg total) daily for the next 5 days >>> Take with food in the morning  Keep follow-up with Dr.Young 05/05/18  Follow-up with our office sooner if symptoms are not improving  Read literature listed below on other ways to manage upper respiratory infections and bronchitis      It is flu season:   >>>Remember to be washing your hands regularly, using hand sanitizer, be careful to use around herself with has contact with people who are sick will increase her chances of getting sick yourself. >>> Best ways to protect herself from the flu: Receive the yearly flu vaccine, practice good hand hygiene washing with soap and also using hand sanitizer when available, eat a nutritious meals, get adequate rest, hydrate appropriately   Please contact the office if your symptoms worsen or you have concerns that you are not improving.   Thank you for choosing [facility] for your healthcare, and for allowing [place] to partner with you on your healthcare journey. I am thankful to be able to provide care to you today.   Wyn Quaker FNP-C    Acute Bronchitis, Adult Acute bronchitis is when air tubes (bronchi) in the lungs suddenly get swollen. The condition can make it hard to breathe. It can also cause these symptoms:  A cough.  Coughing up clear, yellow, or green mucus.  Wheezing.  Chest congestion.  Shortness of breath.  A fever.  Body aches.  Chills.  A sore throat.  Follow these instructions at home: Medicines  Take over-the-counter and prescription medicines only as told by your doctor.  If you were prescribed an antibiotic medicine, take it as told by your doctor. Do not stop taking the antibiotic even if you start to feel  better. General instructions  Rest.  Drink enough fluids to keep your pee (urine) clear or pale yellow.  Avoid smoking and secondhand smoke. If you smoke and you need help quitting, ask your doctor. Quitting will help your lungs heal faster.  Use an inhaler, cool mist vaporizer, or humidifier as told by your doctor.  Keep all follow-up visits as told by your doctor. This is important. How is this prevented? To lower your risk of getting this condition again:  Wash your hands often with soap and water. If you cannot use soap and water, use hand sanitizer.  Avoid contact with people who have cold symptoms.  Try not to touch your hands to your mouth, nose, or eyes.  Make sure to get the flu shot every year.  Contact a doctor if:  Your symptoms do not get better in 2 weeks. Get help right away if:  You cough up blood.  You have chest pain.  You have very bad shortness of breath.  You become dehydrated.  You faint (pass out) or keep feeling like you are going to pass out.  You keep throwing up (vomiting).  You have a very bad headache.  Your fever or chills gets worse. This information is not intended to replace advice given to you by your health care provider. Make sure you discuss any questions you have with your health care provider. Document Released: 01/07/2008 Document Revised: 02/27/2016 Document Reviewed: 01/09/2016 Elsevier Interactive Patient Education  2018 [employer]  [employer]         Upper Respiratory Infection, Adult Most upper respiratory infections (URIs) are caused by a virus. A URI affects the nose, throat, and upper air passages. The most common type of URI is often called "the common cold." Follow these instructions at home:  Take medicines only as told by your doctor.  Gargle warm saltwater or take cough drops to comfort your throat as told by your doctor.  Use a warm mist humidifier or inhale steam from a shower to increase air moisture. This  may make it easier to breathe.  Drink enough fluid to keep your pee (urine) clear or pale yellow.  Eat soups and other clear broths.  Have a healthy diet.  Rest as needed.  Go back to work when your fever is gone or your doctor says it is okay. ? You may need to stay home longer to avoid giving your URI to others. ? You can also wear a face mask and wash your hands often to prevent spread of the virus.  Use your inhaler more if you have asthma.  Do not use any tobacco products, including cigarettes, chewing tobacco, or electronic cigarettes. If you need help quitting, ask your doctor. Contact a doctor if:  You are getting worse, not better.  Your symptoms are not helped by medicine.  You have chills.  You are getting more short of breath.  You have brown or red mucus.  You have yellow or brown discharge from your nose.  You have pain in your face, especially when you bend forward.  You have a fever.  You have puffy (swollen) neck glands.  You have pain while swallowing.  You have white areas in the back of your throat. Get help right away if:  You have very bad or constant: ? Headache. ? Ear pain. ? Pain in your forehead, behind your eyes, and over your cheekbones (sinus pain). ? Chest pain.  You have long-lasting (chronic) lung disease and any of the following: ? Wheezing. ? Long-lasting cough. ? Coughing up blood. ? A change in your usual mucus.  You have a stiff neck.  You have changes in your: ? Vision. ? Hearing. ? Thinking. ? Mood. This information is not intended to replace advice given to you by your health care provider. Make sure you discuss any questions you have with your health care provider. Document Released: 01/07/2008 Document Revised: 03/23/2016 Document Reviewed: 10/26/2013 Elsevier Interactive Patient Education  2018 [employer].

## 2018-04-16 ENCOUNTER — Ambulatory Visit (HOSPITAL_COMMUNITY): Payer: Medicare Other

## 2018-04-16 ENCOUNTER — Ambulatory Visit: Payer: Medicare Other | Admitting: Physical Therapy

## 2018-04-19 ENCOUNTER — Ambulatory Visit (HOSPITAL_COMMUNITY): Payer: Medicare Other

## 2018-04-19 ENCOUNTER — Telehealth: Payer: Self-pay | Admitting: Pulmonary Disease

## 2018-04-19 MED ORDER — AMOXICILLIN-POT CLAVULANATE 875-125 MG PO TABS: 1.0000 | ORAL_TABLET | Freq: Two times a day (BID) | ORAL | 0 refills | Status: DC

## 2018-04-19 NOTE — Telephone Encounter (Signed)
Called and spoke with Patient.  She stated that she saw Donna Bailey 04/15/18 and was told she had bronchitis.  She was prescribed Prednisone and Z pack, which she has finished today.  She stated that she feels worse then before.  She has productive cough with green mucus, wheezing, and chest congestion.  She has felt chills and hot, but no fever that she is aware of.  She is requesting any prescriptions to [facility] [address].  Dr. [name], please advise  Allergies  Allergen Reactions  . Sulfa Antibiotics Anaphylaxis  . Sulfonamide Derivatives Anaphylaxis  . Penicillins Rash    Underarms (both) Has patient had a PCN reaction causing immediate rash, facial/tongue/throat swelling, SOB or lightheadedness with hypotension: YES Has patient had a PCN reaction causing severe rash involving mucus membranes or skin necrosis: NO Has patient had a PCN reaction that required hospitalization NO Has patient had a PCN reaction occurring within the last 10 years: NO If all of the above answers are "NO", then may proceed with Cephalosporin use.  Donna Bailey [Fexofenadine]     Heart race  . Avelox [Moxifloxacin]     Doesn't remember  . Caffeine     Heart race  . Cephalexin Other (See Comments)    unknown  . Ciprofloxacin Other (See Comments)    unknown  . Corn-Containing Products Itching and Other (See Comments)    Can not walk if have a lot of it.  . Crestor [Rosuvastatin] Other (See Comments)    Lost all muscle mobility   . Doxycycline Diarrhea and Nausea And Vomiting  . Formoterol Fumarate Other (See Comments)    unknown  . Gold-Containing Drug Products     Skin tingle   . Latex     Gets red   . Levofloxacin     REACTION: HEART RACING  . Macrodantin [Nitrofurantoin Macrocrystal] Itching  . Neomycin Sulfate [Neomycin]     Doesn't remember - allergist said not to use it because it could add more allergies   . Ofloxacin Itching  . Other     Corn fillers, corn by-products - causes  severe itching Polyester-"pins sticking in her skin" Gold  . Adhesive [Tape] Rash  . Ibuprofen Rash  . Mold Extract [Trichophyton Mentagrophyte] Other (See Comments)    Bumps on back, stops up sinuses.  . Nickel Rash  . Tylenol [Acetaminophen] Rash    On face   Current Outpatient Medications on File Prior to Visit  Medication Sig Dispense Refill  . Alirocumab (PRALUENT) 75 MG/ML SOPN Inject 1 pen into the skin every 14 (fourteen) days. 2 pen 11  . aspirin 81 MG chewable tablet Chew 1 tablet (81 mg total) by mouth daily. 30 tablet 11  . atorvastatin (LIPITOR) 10 MG tablet Take 10 mg by mouth daily.    Marland Kitchen azithromycin (ZITHROMAX) 250 MG tablet 500mg  (two tablets) today, then 250mg  (1 tablet) for the next 4 days 6 tablet 0  . carvedilol (COREG) 6.25 MG tablet Take 6.25 mg by mouth 2 (two) times daily with a meal.    . Cyanocobalamin (VITAMIN B-12 IJ) Inject 1,000 mg as directed every 30 (thirty) days.    . diphenhydrAMINE (BENADRYL) 25 mg capsule Take 25 mg by mouth every 6 (six) hours as needed for itching or allergies.     Marland Kitchen esomeprazole (NEXIUM) 20 MG capsule Take 20 mg by mouth daily at 12 noon.     . furosemide (LASIX) 20 MG tablet Take 1 tablet (20  mg total) by mouth daily. 90 tablet 3  . guaiFENesin-codeine 100-10 MG/5ML syrup Take 5 mLs by mouth every 4 (four) hours as needed for cough.  1  . isosorbide mononitrate (IMDUR) 30 MG 24 hr tablet Take 0.5 tablets (15 mg total) by mouth daily. 45 tablet 3  . nitroGLYCERIN (NITROSTAT) 0.4 MG SL tablet Place 1 tablet (0.4 mg total) under the tongue every 5 (five) minutes as needed for chest pain. 25 tablet 2  . ondansetron (ZOFRAN) 4 MG tablet Take 1 tablet (4 mg total) by mouth every 8 (eight) hours as needed for nausea or vomiting. 4 tablet 0  . predniSONE (DELTASONE) 20 MG tablet Take 1 tablet (20 mg total) by mouth daily with breakfast. 5 tablet 0  . ranolazine (RANEXA) 1000 MG SR tablet Take 1 tablet (1,000 mg total) by mouth 2 (two)  times daily. 90 tablet 3  . ticagrelor (BRILINTA) 90 MG TABS tablet Take 1 tablet (90 mg total) by mouth 2 (two) times daily. 60 tablet 11  . zolpidem (AMBIEN) 10 MG tablet Take 1 tablet (10 mg total) by mouth at bedtime. (Patient taking differently: Take 10 mg by mouth at bedtime as needed for sleep. ) 30 tablet 5   No current facility-administered medications on file prior to visit.

## 2018-04-19 NOTE — Telephone Encounter (Signed)
If the only reaction she had to penicillin in the past was an underarm rash (which is what we have in her record) then I would consider trying augmentin 500 mg, # 14, along with a daily antihistamine like claritin. She could come here and take her first dose where we can watch her out in the waiting room for a couple of hours.

## 2018-04-19 NOTE — Telephone Encounter (Signed)
Called and spoke to patient. Relayed message from CY.  Patient stated she doesn't think she had a bad reaction to PCN. [facility] Patient stated she is going to call the manufacturer of the medication after speaking to pharmacy to find out if the medication has "corn fillers" since she has an allergy to that. Patient also stated she can only take the clear benadryl as an antihistamine. Sent in Rx per Dr. [name].  Nothing further needed at this time.

## 2018-04-20 ENCOUNTER — Ambulatory Visit: Payer: Medicare Other | Admitting: Physical Therapy

## 2018-04-21 ENCOUNTER — Ambulatory Visit (HOSPITAL_COMMUNITY): Payer: Medicare Other

## 2018-04-23 ENCOUNTER — Telehealth: Payer: Self-pay | Admitting: Internal Medicine

## 2018-04-23 ENCOUNTER — Ambulatory Visit (HOSPITAL_COMMUNITY): Payer: Medicare Other

## 2018-04-23 ENCOUNTER — Ambulatory Visit: Payer: Medicare Other | Admitting: Physical Therapy

## 2018-04-23 MED ORDER — PREDNISONE 20 MG PO TABS: 20.0000 mg | ORAL_TABLET | Freq: Every day | ORAL | 0 refills | Status: DC

## 2018-04-23 NOTE — Telephone Encounter (Signed)
Suggest Mucinex-DM for cough and to help break up mucus  Suggest prednisone 20 mg, # 5, 1 daily x 5 days  Drink lots of water  Finish the augmentin

## 2018-04-23 NOTE — Telephone Encounter (Signed)
Has 4 days left of Augmentin Rx. Pt continues to have cough-deep and unable to break up anything in her chest. Pt used up her cough syrup rx (Tussionex) from February and no relief. Wheezing as well. Denies any fever or chills.   Last seen 04-15-18  Allergies  Allergen Reactions  . Sulfa Antibiotics Anaphylaxis  . Sulfonamide Derivatives Anaphylaxis  . Penicillins Rash    Underarms (both) Has patient had a PCN reaction causing immediate rash, facial/tongue/throat swelling, SOB or lightheadedness with hypotension: YES Has patient had a PCN reaction causing severe rash involving mucus membranes or skin necrosis: NO Has patient had a PCN reaction that required hospitalization NO Has patient had a PCN reaction occurring within the last 10 years: NO If all of the above answers are "NO", then may proceed with Cephalosporin use.  Delma Freeze [Fexofenadine]     Heart race  . Avelox [Moxifloxacin]     Doesn't remember  . Caffeine     Heart race  . Cephalexin Other (See Comments)    unknown  . Ciprofloxacin Other (See Comments)    unknown  . Corn-Containing Products Itching and Other (See Comments)    Can not walk if have a lot of it.  . Crestor [Rosuvastatin] Other (See Comments)    Lost all muscle mobility   . Doxycycline Diarrhea and Nausea And Vomiting  . Formoterol Fumarate Other (See Comments)    unknown  . Gold-Containing Drug Products     Skin tingle   . Latex     Gets red   . Levofloxacin     REACTION: HEART RACING  . Macrodantin [Nitrofurantoin Macrocrystal] Itching  . Neomycin Sulfate [Neomycin]     Doesn't remember - allergist said not to use it because it could add more allergies   . Ofloxacin Itching  . Other     Corn fillers, corn by-products - causes severe itching Polyester-"pins sticking in her skin" Gold  . Adhesive [Tape] Rash  . Ibuprofen Rash  . Mold Extract [Trichophyton Mentagrophyte] Other (See Comments)    Bumps on back, stops up sinuses.  . Nickel Rash   . Tylenol [Acetaminophen] Rash    On face    CY Please advise. Thanks.

## 2018-04-23 NOTE — Telephone Encounter (Signed)
Pt aware of recs from CY-will pick up Mucinex DM at pharmacy and sent Prednisone rx to [facility] per patient. Nothing more needed at this time.

## 2018-04-25 ENCOUNTER — Emergency Department (HOSPITAL_COMMUNITY)
Admission: EM | Admit: 2018-04-25 | Discharge: 2018-04-25 | Disposition: A | Discharge status: Home / Self Care | Payer: Medicare Other | Attending: Emergency Medicine | Admitting: Emergency Medicine

## 2018-04-25 ENCOUNTER — Emergency Department (HOSPITAL_COMMUNITY): Payer: Medicare Other

## 2018-04-25 ENCOUNTER — Encounter (HOSPITAL_COMMUNITY): Payer: Self-pay | Admitting: *Deleted

## 2018-04-25 ENCOUNTER — Other Ambulatory Visit: Payer: Self-pay

## 2018-04-25 DIAGNOSIS — J40 Bronchitis, not specified as acute or chronic: Secondary | ICD-10-CM | POA: Insufficient documentation

## 2018-04-25 DIAGNOSIS — R05 Cough: Secondary | ICD-10-CM | POA: Diagnosis present

## 2018-04-25 DIAGNOSIS — I2510 Atherosclerotic heart disease of native coronary artery without angina pectoris: Secondary | ICD-10-CM | POA: Insufficient documentation

## 2018-04-25 LAB — CBC
HEMATOCRIT: 37 % (ref 36.0–46.0)
Hemoglobin: 12 g/dL (ref 12.0–15.0)
MCH: 32.3 pg (ref 26.0–34.0)
MCHC: 32.4 g/dL (ref 30.0–36.0)
MCV: 99.7 fL (ref 78.0–100.0)
PLATELETS: 296 10*3/uL (ref 150–400)
RBC: 3.71 MIL/uL — AB (ref 3.87–5.11)
RDW: 14.7 % (ref 11.5–15.5)
WBC: 10.2 10*3/uL (ref 4.0–10.5)

## 2018-04-25 LAB — BASIC METABOLIC PANEL
ANION GAP: 7 (ref 5–15)
BUN: 12 mg/dL (ref 8–23)
CHLORIDE: 104 mmol/L (ref 98–111)
CO2: 27 mmol/L (ref 22–32)
CREATININE: 1.1 mg/dL — AB (ref 0.44–1.00)
Calcium: 8.9 mg/dL (ref 8.9–10.3)
GFR calc non Af Amer: 49 mL/min — ABNORMAL LOW (ref >60)
GFR, EST AFRICAN AMERICAN: 57 mL/min — AB (ref >60)
Glucose, Bld: 100 mg/dL — ABNORMAL HIGH (ref 70–99)
Potassium: 4.3 mmol/L (ref 3.5–5.1)
SODIUM: 138 mmol/L (ref 135–145)

## 2018-04-25 LAB — I-STAT TROPONIN, ED: Troponin i, poc: 0 ng/mL (ref 0.00–0.08)

## 2018-04-25 MED ORDER — NYSTATIN 100000 UNIT/ML MT SUSP: 500000.0000 [IU] | Freq: Four times a day (QID) | OROMUCOSAL | 1 refills | Status: DC

## 2018-04-25 MED ORDER — SODIUM CHLORIDE 0.9 % IV BOLUS
1000.0000 mL | Freq: Once | INTRAVENOUS | Status: AC
  Administered 2018-04-25: 1000 mL via INTRAVENOUS

## 2018-04-25 NOTE — ED Notes (Signed)
Discharge instructions and prescriptions discussed with Pt. Pt verbalized understanding. Pt stable and ambulatory.   

## 2018-04-25 NOTE — ED Notes (Signed)
Pt given turkey sandwich and cranberry juice. 

## 2018-04-25 NOTE — ED Notes (Signed)
Patient arrived to room with a bag of her own sheets. Pt stated that she was allergic to hospital sheets and refused to put on a gown.

## 2018-04-25 NOTE — ED Triage Notes (Signed)
Pt reports being diagnosed with bronchitis on 9/12 and no relief after antibiotics x 2. Reports having a productive cough. No resp distress is noted at triage.

## 2018-04-25 NOTE — Discharge Instructions (Signed)
You were evaluated in the Emergency Department and after careful evaluation, we did not find any emergent condition requiring admission or further testing in the hospital.  Your symptoms today seem to be due to viral bronchitis.  You also appear to have oral thrush.  Please use the medication provided as directed, swish and spit 4 times a day.  You should use the medicine until your oral lesions are resolved, and then continue to take the medicine for 2 more days after that.  Please return to the Emergency Department if you experience any worsening of your condition.  We encourage you to follow up with a primary care provider.  Thank you for allowing [place] to be a part of your care.
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
## 2018-04-26 ENCOUNTER — Ambulatory Visit (HOSPITAL_COMMUNITY): Payer: Medicare Other

## 2018-04-27 ENCOUNTER — Telehealth: Payer: Self-pay | Admitting: Internal Medicine

## 2018-04-27 ENCOUNTER — Ambulatory Visit (INDEPENDENT_AMBULATORY_CARE_PROVIDER_SITE_OTHER): Payer: Medicare Other | Admitting: Internal Medicine

## 2018-04-27 ENCOUNTER — Ambulatory Visit: Payer: Medicare Other | Admitting: Physical Therapy

## 2018-04-27 ENCOUNTER — Encounter: Payer: Self-pay | Admitting: Internal Medicine

## 2018-04-27 VITALS — BP 124/64 | HR 59 | Temp 98.0°F | Ht 66.0 in | Wt 174.8 lb

## 2018-04-27 DIAGNOSIS — J45909 Unspecified asthma, uncomplicated: Secondary | ICD-10-CM

## 2018-04-27 DIAGNOSIS — J209 Acute bronchitis, unspecified: Secondary | ICD-10-CM

## 2018-04-27 MED ORDER — LEVALBUTEROL HCL 0.63 MG/3ML IN NEBU
0.6300 mg | INHALATION_SOLUTION | Freq: Once | RESPIRATORY_TRACT | Status: AC
  Administered 2018-04-27: 0.63 mg via RESPIRATORY_TRACT

## 2018-04-27 MED ORDER — METHYLPREDNISOLONE ACETATE 80 MG/ML IJ SUSP
80.0000 mg | Freq: Once | INTRAMUSCULAR | Status: AC
  Administered 2018-04-27: 80 mg via INTRAMUSCULAR

## 2018-04-27 MED ORDER — LEVALBUTEROL HCL 0.63 MG/3ML IN NEBU: 0.6300 mg | INHALATION_SOLUTION | Freq: Four times a day (QID) | RESPIRATORY_TRACT | 12 refills | Status: DC | PRN

## 2018-04-27 MED ORDER — COMPRESSOR NEBULIZER MISC: 1.0000 [IU] | Freq: Once | 0 refills | Status: AC

## 2018-04-27 NOTE — Patient Instructions (Addendum)
Order- neb xop 0.63    Dx acute bronchitis              Depo 4  Order- script printed for levalbuterol 0.63 neb solution and for compressor nebulizer through DME company  Avoid getting dehydrated  Ok to keep Oct 2 appointment

## 2018-04-27 NOTE — Progress Notes (Signed)
HPI female never smoker followed for bronchitis, allergic rhinitis, chronic insomnia, "intolerance of all corn products", complicated by GERD and multiple medical problems, CAD/MI/ stent Seroquel made her too dizzy. Belsomra 15 mg made her muscles stiff and left her groggy but did help her sleep. Rozerem did not help with sleep but made her dizzy She again requests prescription for zolpidem 10 mg, insisting that nothing else has worked as well to help her manage chronic insomnia. She limits foods with magnesium which she says make her feel hot She tells me she is allergic to nickel, gold and polyester "all of which conduct electricity". Therefore she wants a letter for her to present to [employer] asking them to change her electric meter to a "noncommunicating electric meter", so that [employer] will stop sending radio waves through her home. She prefers a manual meter check. -----------------------------------------------------------------------------------------------------------------  12/17/2017- 72 year old female never smoker followed for bronchitis, allergic rhinitis, chronic insomnia, intolerance of all corn products, multiple idiosyncratic medication reactions-mostly nonallopathic, complicated by GERD, CAD/MI/stent Hospital follow-up-5/1-12/05/2017-angina, GERD/Barrett's esophagus, recent MI Had MI in April.  Has had bronchitis symptoms this week, treated with Z-Pak.  Sputum is less green and decreased in amount.  No fever, less wheeze.  Perles are not helping much and she asks promethazine codeine cough syrup which is helped her in the past. We reviewed CT scan-no pneumonia or acute process. CTa chest 12/03/2017 IMPRESSION: No evidence of acute pulmonary thromboembolism. Chronic changes related old granulomatous disease. Left ventricular myocardial hypertrophy  04/26/2018- 72 year old female never smoker followed for bronchitis, allergic rhinitis, chronic insomnia, intolerance of all corn  products, multiple idiosyncratic medication reactions-mostly nonallopathic, complicated by GERD, CAD/MI/stent -----Follows for: chronic cough, acute bronchitis. Started having trouble on Sept 12th, increased cough without much mucus production Office visit with NP here 04/15/2018 (Z-Pak and prednisone), ED visit for bronchitis 04/25/2018  Persistent bronchitis over the last couple of weeks.  Had taken Z-Pak and then Augmentin with 3 pills left.  Complains of feeling weak a little fuzzy in her thinking, cough is not better.  Mostly a rattling cough with some sputum.  No fever no blood, no GI upset.  Went to ER yesterday for this and CXR showed no acute process as discussed with her.  They gave her IV fluids but she says she still not urinating much.  They gave nystatin for thrush but felt her primary condition was a viral bronchitis. She has not benefited much from inhalers in the past and I suggested we get her a nebulizer machine. CXR 04/25/2018 No acute cardiopulmonary abnormality.  ROS-see HPI  + = positive Constitutional:   No-   weight loss, night sweats, fevers, chills, fatigue, lassitude. HEENT:   No-  headaches, difficulty swallowing, tooth/dental problems, sore throat,       No- sneezing, no-itching, ear ache, nasal congestion, post nasal drip,  CV:  No-   chest pain, orthopnea, PND, swelling in lower extremities, anasarca,  dizziness, palpitations Resp: + shortness of breath with exertion or at rest.             + productive cough,  + non-productive cough,  No- coughing up of blood.              +change in color of mucus.  No- wheezing.   Skin: Per HPI GI:  No-   heartburn, indigestion, abdominal pain, nausea, vomiting,  GU:  MS:  No-   joint pain or swelling.   Neuro-     nothing  unusual Psych:  No- change in mood or affect. No depression or anxiety.  No memory loss.  OBJ General- Alert, Oriented, Affect-appropriate, Distress- none acute, + obese  Skin- no rash seen Lymphadenopathy-  none Head- atraumatic            Eyes- Gross vision intact, PERRLA, conjunctivae clear secretions.            Ears- Hearing grossly normal,             Nose- clear, no-Septal dev, polyps, erosion, perforation             Throat- Mallampati II , mucosa-clear , drainage- none, tonsils- atrophic, + own teeth Neck- flexible , trachea midline, no stridor , thyroid nl, carotid no bruit Chest - symmetrical excursion , unlabored           Heart/CV- RRR , no murmur , no gallop  , no rub, nl s1 s2                           - JVD- none , edema- none, stasis changes- none, varices- none           Lung-+  deep bronchitic cough, unlabored, dullness-none, rub- none,           Chest wall-  Abd-  Br/ Gen/ Rectal- Not done, not indicated Extrem- cyanosis- none, clubbing, none, atrophy- none, strength- nl Neuro- grossly intact to observation

## 2018-04-27 NOTE — Assessment & Plan Note (Signed)
Subacute bronchitis, likely viral initially.  No pneumonia by CXR yesterday and no leukocytosis.  She is dehydrated and weakness mostly from that. Plan-encourage fluids and rest.  Nebulizer treatment today with Xopenex, Depo-Medrol.  Prescribed home nebulizer with Xopenex through a DME company for Medicare support.

## 2018-04-27 NOTE — Telephone Encounter (Signed)
Spoke with patient. She stated that she is still not feeling better and would like to come in to see Dr. [name]. Patient has been scheduled with CY for 11am this morning. Patient verbalized understanding. Nothing further needed at time of call.

## 2018-04-28 ENCOUNTER — Ambulatory Visit (HOSPITAL_COMMUNITY): Payer: Medicare Other

## 2018-04-29 ENCOUNTER — Telehealth: Payer: Self-pay | Admitting: Internal Medicine

## 2018-04-29 MED ORDER — ALBUTEROL SULFATE (2.5 MG/3ML) 0.083% IN NEBU: 2.5000 mg | INHALATION_SOLUTION | Freq: Four times a day (QID) | RESPIRATORY_TRACT | 12 refills | Status: DC | PRN

## 2018-04-29 NOTE — Telephone Encounter (Signed)
Per CY-give Rx for albuterol neb.

## 2018-04-30 ENCOUNTER — Ambulatory Visit: Payer: Medicare Other | Admitting: Physical Therapy

## 2018-04-30 ENCOUNTER — Ambulatory Visit (HOSPITAL_COMMUNITY): Payer: Medicare Other

## 2018-05-03 ENCOUNTER — Telehealth: Payer: Self-pay | Admitting: Internal Medicine

## 2018-05-03 ENCOUNTER — Ambulatory Visit (HOSPITAL_COMMUNITY): Payer: Medicare Other

## 2018-05-03 NOTE — Telephone Encounter (Signed)
Spoke with pharmacist, she wanted to see if we could change the quantity to a 30 day supply. I advised her that this would be ok. Nothing further is needed. Hold time was over 15 mins.

## 2018-05-05 ENCOUNTER — Ambulatory Visit (HOSPITAL_COMMUNITY): Payer: Medicare Other

## 2018-05-05 ENCOUNTER — Ambulatory Visit (INDEPENDENT_AMBULATORY_CARE_PROVIDER_SITE_OTHER): Payer: Medicare Other | Admitting: Internal Medicine

## 2018-05-05 ENCOUNTER — Encounter: Payer: Self-pay | Admitting: Internal Medicine

## 2018-05-05 VITALS — BP 130/78 | HR 64 | Ht 66.0 in | Wt 165.0 lb

## 2018-05-05 DIAGNOSIS — J209 Acute bronchitis, unspecified: Secondary | ICD-10-CM

## 2018-05-05 DIAGNOSIS — J069 Acute upper respiratory infection, unspecified: Secondary | ICD-10-CM | POA: Diagnosis not present

## 2018-05-05 MED ORDER — METHYLPREDNISOLONE ACETATE 80 MG/ML IJ SUSP
80.0000 mg | Freq: Once | INTRAMUSCULAR | Status: AC
  Administered 2018-05-05: 80 mg via INTRAMUSCULAR

## 2018-05-05 MED ORDER — PROMETHAZINE-CODEINE 6.25-10 MG/5ML PO SYRP: 5.0000 mL | ORAL_SOLUTION | Freq: Four times a day (QID) | ORAL | 0 refills | Status: DC | PRN

## 2018-05-05 MED ORDER — LEVALBUTEROL HCL 0.63 MG/3ML IN NEBU
0.6300 mg | INHALATION_SOLUTION | Freq: Once | RESPIRATORY_TRACT | Status: AC
  Administered 2018-05-05: 0.63 mg via RESPIRATORY_TRACT

## 2018-05-05 NOTE — Assessment & Plan Note (Signed)
She is dealing with residual after a respiratory infection but I do not believe she has an active bacterial infection needing further antibiotics at this time.

## 2018-05-05 NOTE — Progress Notes (Signed)
HPI female never smoker followed for bronchitis, allergic rhinitis, chronic insomnia, "intolerance of all corn products", complicated by GERD and multiple medical problems, CAD/MI/ stent Seroquel made her too dizzy. Belsomra 15 mg made her muscles stiff and left her groggy but did help her sleep. Rozerem did not help with sleep but made her dizzy She again requests prescription for zolpidem 10 mg, insisting that nothing else has worked as well to help her manage chronic insomnia. She limits foods with magnesium which she says make her feel hot She tells me she is allergic to nickel, gold and polyester "all of which conduct electricity". Therefore she wants a letter for her to present to [employer] asking them to change her electric meter to a "noncommunicating electric meter", so that [employer] will stop sending radio waves through her home. She prefers a manual meter check. -----------------------------------------------------------------------------------------------------------------  04/26/2018- 72 year old female never smoker followed for bronchitis, allergic rhinitis, chronic insomnia, intolerance of all corn products, multiple idiosyncratic medication reactions-mostly nonallopathic, complicated by GERD, CAD/MI/stent -----Follows for: chronic cough, acute bronchitis. Started having trouble on Sept 12th, increased cough without much mucus production Office visit with NP here 04/15/2018 (Z-Pak and prednisone), ED visit for bronchitis 04/25/2018  Persistent bronchitis over the last couple of weeks.  Had taken Z-Pak and then Augmentin with 3 pills left.  Complains of feeling weak a little fuzzy in her thinking, cough is not better.  Mostly a rattling cough with some sputum.  No fever no blood, no GI upset.  Went to ER yesterday for this and CXR showed no acute process as discussed with her.  They gave her IV fluids but she says she still not urinating much.  They gave nystatin for thrush but felt her  primary condition was a viral bronchitis. She has not benefited much from inhalers in the past and I suggested we get her a nebulizer machine. CXR 04/25/2018 No acute cardiopulmonary abnormality.  05/05/2018- 72 year old female never smoker followed for bronchitis, allergic rhinitis, chronic insomnia, intolerance of all corn products, multiple idiosyncratic medication reactions-mostly nonallopathic, complicated by GERD, CAD/MI/stent -----Bronchitis: Pt continues to have increased  cough and wheezing-last several minutes. Pt has not heard from [facility] regarding her neb machine and albuterol nebulizer Rx. We had changed Rx from xopenex because [facility] doesn't carry xopenex for her nebulizer. Has finished Augmentin. Last saw cardiology late August. Rattling cough is nonproductive now with no fever.  ROS-see HPI  + = positive Constitutional:   No-   weight loss, night sweats, fevers, chills, fatigue, lassitude. HEENT:   No-  headaches, difficulty swallowing, tooth/dental problems, sore throat,       No- sneezing, no-itching, ear ache, nasal congestion, post nasal drip,  CV:  No-   chest pain, orthopnea, PND, swelling in lower extremities, anasarca,  dizziness, palpitations Resp: + shortness of breath with exertion or at rest.             + productive cough,  + non-productive cough,  No- coughing up of blood.              +change in color of mucus.  No- wheezing.   Skin: Per HPI GI:  No-   heartburn, indigestion, abdominal pain, nausea, vomiting,  GU:  MS:  No-   joint pain or swelling.   Neuro-     nothing unusual Psych:  No- change in mood or affect. No depression or anxiety.  No memory loss.  OBJ General- Alert, Oriented, Affect-appropriate, Distress- none acute, +  obese  Skin- no rash seen Lymphadenopathy- none Head- atraumatic            Eyes- Gross vision intact, PERRLA, conjunctivae clear secretions.            Ears- Hearing grossly normal,             Nose- clear, no-Septal dev,  polyps, erosion, perforation             Throat- Mallampati II , mucosa-clear , drainage- none, tonsils- atrophic, + own teeth Neck- flexible , trachea midline, no stridor , thyroid nl, carotid no bruit Chest - symmetrical excursion , unlabored           Heart/CV- RRR , no murmur , no gallop  , no rub, nl s1 s2                           - JVD- none , edema- none, stasis changes- none, varices- none           Lung-+  deep bronchitic cough, unlabored, dullness-none, rub- none,           Chest wall-  Abd-  Br/ Gen/ Rectal- Not done, not indicated Extrem- cyanosis- none, clubbing, none, atrophy- none, strength- nl Neuro- grossly intact to observation

## 2018-05-05 NOTE — Patient Instructions (Addendum)
Order- neb xop 0.63    Dx acute bronchitis               Depo 80   Script printed for cough syrup  Order-printed script for Flutter device    Blow through 4 times per set, 3 sets per day, to loosen mucus  Don't forget your flu shot

## 2018-05-05 NOTE — Assessment & Plan Note (Signed)
Slow to clear deep rattling cough but now nonproductive.  I do not think she needs more antibiotics since chest x-ray was clear recently.  Nebulizer should help and is on its way. We will watch for potential pulmonary effects of cardiac meds, potential for mild CHF to present his respiratory symptoms. Plan-nebulizer treatment Xopenex here, Depo-Medrol, cough syrup, flutter device

## 2018-05-07 ENCOUNTER — Ambulatory Visit (HOSPITAL_COMMUNITY): Payer: Medicare Other

## 2018-05-07 ENCOUNTER — Encounter: Payer: Self-pay | Admitting: Physician Assistant

## 2018-05-10 ENCOUNTER — Ambulatory Visit (HOSPITAL_COMMUNITY): Payer: Medicare Other

## 2018-05-12 ENCOUNTER — Ambulatory Visit (HOSPITAL_COMMUNITY): Payer: Medicare Other

## 2018-05-13 ENCOUNTER — Ambulatory Visit (INDEPENDENT_AMBULATORY_CARE_PROVIDER_SITE_OTHER): Payer: Medicare Other

## 2018-05-13 DIAGNOSIS — Z23 Encounter for immunization: Secondary | ICD-10-CM

## 2018-05-14 ENCOUNTER — Ambulatory Visit (HOSPITAL_COMMUNITY): Payer: Medicare Other

## 2018-05-15 ENCOUNTER — Other Ambulatory Visit: Payer: Self-pay | Admitting: Pulmonary Disease

## 2018-05-15 MED ORDER — AMOXICILLIN-POT CLAVULANATE 875-125 MG PO TABS: 1.0000 | ORAL_TABLET | Freq: Two times a day (BID) | ORAL | 0 refills | Status: DC

## 2018-05-15 MED ORDER — DEXAMETHASONE 4 MG PO TABS: 4.0000 mg | ORAL_TABLET | Freq: Every day | ORAL | 0 refills | Status: AC

## 2018-05-15 NOTE — Progress Notes (Signed)
Patient called complaining of increased sputum production and a change of color and sputum from clear to green.  Denies significant increase in dyspnea, chest pain, chills or fever.  Suspect exacerbation of COPD.  I have phoned in prescription for Augmentin at patient's request as well as a short course of dexamethasone.  Patient advised to call office on Monday to set up an appointment to proceed to the emergency department if her breathing does not improve.  Kipp Brood, MD [facility] ICU Physician [place]  Pager: [phone] Mobile: [phone] After hours: [phone].

## 2018-05-17 ENCOUNTER — Ambulatory Visit (HOSPITAL_COMMUNITY): Payer: Medicare Other

## 2018-05-19 ENCOUNTER — Ambulatory Visit (HOSPITAL_COMMUNITY): Payer: Medicare Other

## 2018-05-21 ENCOUNTER — Ambulatory Visit (HOSPITAL_COMMUNITY): Payer: Medicare Other

## 2018-05-24 ENCOUNTER — Ambulatory Visit (HOSPITAL_COMMUNITY): Payer: Medicare Other

## 2018-05-26 ENCOUNTER — Ambulatory Visit (HOSPITAL_COMMUNITY): Payer: Medicare Other

## 2018-05-28 ENCOUNTER — Ambulatory Visit (HOSPITAL_COMMUNITY): Payer: Medicare Other

## 2018-05-31 ENCOUNTER — Ambulatory Visit (HOSPITAL_COMMUNITY): Payer: Medicare Other

## 2018-06-01 ENCOUNTER — Other Ambulatory Visit: Payer: Medicare Other

## 2018-06-01 ENCOUNTER — Telehealth: Payer: Self-pay | Admitting: *Deleted

## 2018-06-01 DIAGNOSIS — E785 Hyperlipidemia, unspecified: Secondary | ICD-10-CM

## 2018-06-01 DIAGNOSIS — I25118 Atherosclerotic heart disease of native coronary artery with other forms of angina pectoris: Secondary | ICD-10-CM

## 2018-06-01 LAB — HEPATIC FUNCTION PANEL
ALBUMIN: 4 g/dL (ref 3.5–4.8)
ALK PHOS: 71 IU/L (ref 39–117)
ALT: 14 IU/L (ref 0–32)
AST: 14 IU/L (ref 0–40)
BILIRUBIN TOTAL: 0.4 mg/dL (ref 0.0–1.2)
BILIRUBIN, DIRECT: 0.14 mg/dL (ref 0.00–0.40)
TOTAL PROTEIN: 6.1 g/dL (ref 6.0–8.5)

## 2018-06-01 LAB — LIPID PANEL
Chol/HDL Ratio: 2.2 ratio (ref 0.0–4.4)
Cholesterol, Total: 140 mg/dL (ref 100–199)
HDL: 65 mg/dL (ref >39)
LDL Calculated: 49 mg/dL (ref 0–99)
Triglycerides: 128 mg/dL (ref 0–149)
VLDL Cholesterol Cal: 26 mg/dL (ref 5–40)

## 2018-06-01 NOTE — Telephone Encounter (Signed)
   Shortness of breath -   I will review with Dr. Saunders Revel to see if we can change Brilinta to Plavix.  If she is having chest pain, swelling, orthopnea - she needs earlier follow up or go to the ED   Bruising -   I will review with Dr. Saunders Revel to see if we can change Brilinta to Plavix.    Leg injury - Follow up with PCP   Arthralgias - Follow up with PCP   Fatigue - Follow up with PCP   Balance - follow up with PCP next week with planned appointment.    Richardson Dopp, PA-C    06/01/2018 5:01 PM

## 2018-06-01 NOTE — Telephone Encounter (Signed)
-----   Message from Liliane Shi, [place] sent at 06/01/2018  3:51 PM EDT ----- Lipids excellent (LDL is < 70; HDL is > 50).  LFTs normal.  Recommendations:  - Continue current medications and follow up as planned.  Richardson Dopp, PA-C    06/01/2018 3:50 PM

## 2018-06-01 NOTE — Telephone Encounter (Signed)
Pt has been notified of lab results by phone with verbal understanding. Pt does state to me though she has been having SOB when trying to just walk around her house. I did state since she is on Brilinta I will d/w Brynda Rim. PA for further advice. Pt states she bruises very easily and she was moving something heavy about 5 weeks ago and she still is having leg pain where the item hit her legs. I asked if the area was warm, hot to the touch, pt answered no, pt states swelling has resolved as well. Pt states she has pain sometime on other parts of her leg as well up in her arms or shoulders or neck. Pt denies chest pain. I did explain that the leg pain could be that she may have bruised the bone and the nerve endings are sensitive during the healing process. Pt does state she does fatigue easily since the heart attack. I explained to the pt that this is going top take some time for her body to heal after the heart attack. Pt states she has been having some balance issues 2-3 times a day. I did advise pt she should f/u with PCP in regards to balance. I advised the pt again though I am going to d/w PA for further recommendations an that I will call her back tomorrow. Pt thanked me for the call. Pt did say she has appt with PCP Tuesday next week.

## 2018-06-02 ENCOUNTER — Ambulatory Visit (HOSPITAL_COMMUNITY): Payer: Medicare Other

## 2018-06-02 NOTE — Telephone Encounter (Signed)
Reviewed with Dr. Saunders Revel and Jerilynn Mages. Supple, PharmD.  We recommend the patient check with her pharmacy to see if they are able to get a formulation of Plavix (Clopidogrel) that she can tolerate based upon her allergies/intolerances to corn filler.  If they are able to obtain such a product, we can switch her to Plavix.  If this is not possible, it would be safer for her to remain on Brilinta and ASA for 1 year post MI/PCI.  She will be 1 year out in 11/2018.  Richardson Dopp, PA-C    06/02/2018 4:35 PM

## 2018-06-04 ENCOUNTER — Ambulatory Visit (HOSPITAL_COMMUNITY): Payer: Medicare Other

## 2018-06-04 NOTE — Telephone Encounter (Signed)
Tried to reach pt today though there was no answer. Called to go over recommendations per Brynda Rim. PA in regards to Plavix as well as other recommendations to f/u w/PCP in regards to her leg injury, arthralgias, fatigue and balance issues.

## 2018-06-07 ENCOUNTER — Ambulatory Visit (HOSPITAL_COMMUNITY): Payer: Medicare Other

## 2018-06-09 ENCOUNTER — Ambulatory Visit (HOSPITAL_COMMUNITY): Payer: Medicare Other

## 2018-06-09 NOTE — Telephone Encounter (Signed)
Agree.  Donna Bailey needs to be dosed Twice daily.   She can likely stop Brilinta in April 2020 (she will be 1 year out from her MI at that point). I would prefer she remain on the Brilinta until then.   Richardson Dopp, PA-C    06/09/2018 4:29 PM

## 2018-06-09 NOTE — Telephone Encounter (Signed)
I was able to reach the pt today and went over recommendations per Richardson Dopp, PA in regards to Brilinta and Plavix. Advised pt if she could find a formulation of Plavix that does not contain corn filler we could send in an Rx for her. Pt states she did find a company that can do Plavix w/o corn filler, though it was very expensive per pt. She then asked could she take the Brilinta only once a day. I explained no as she recently had MI/PCI and the dose of Brilinta is to help keep the stent open and help lessen her risk of another MI. I explained to the pt she has appt with Richardson Dopp, PA 07/08/18 and she d/w him then if there are any other options. Advised otherwise it is safer for her to remain on the [place]. Pt also advised to f/u w/PCP in regards to balance issues, arthralgias, leg injury, fatigue. Pt thanked me for the call.

## 2018-06-11 ENCOUNTER — Ambulatory Visit (HOSPITAL_COMMUNITY): Payer: Medicare Other

## 2018-06-14 ENCOUNTER — Ambulatory Visit (HOSPITAL_COMMUNITY): Payer: Medicare Other

## 2018-06-16 ENCOUNTER — Ambulatory Visit (HOSPITAL_COMMUNITY): Payer: Medicare Other

## 2018-06-18 ENCOUNTER — Ambulatory Visit (HOSPITAL_COMMUNITY): Payer: Medicare Other

## 2018-06-21 ENCOUNTER — Ambulatory Visit (HOSPITAL_COMMUNITY): Payer: Medicare Other

## 2018-06-23 ENCOUNTER — Ambulatory Visit (HOSPITAL_COMMUNITY): Payer: Medicare Other

## 2018-06-25 ENCOUNTER — Ambulatory Visit (HOSPITAL_COMMUNITY): Payer: Medicare Other

## 2018-06-28 ENCOUNTER — Ambulatory Visit (HOSPITAL_COMMUNITY): Payer: Medicare Other

## 2018-06-30 ENCOUNTER — Ambulatory Visit (HOSPITAL_COMMUNITY): Payer: Medicare Other

## 2018-07-02 ENCOUNTER — Ambulatory Visit (HOSPITAL_COMMUNITY): Payer: Medicare Other

## 2018-07-05 ENCOUNTER — Ambulatory Visit (HOSPITAL_COMMUNITY): Payer: Medicare Other

## 2018-07-07 ENCOUNTER — Ambulatory Visit: Payer: Medicare Other | Admitting: Physician Assistant

## 2018-07-08 ENCOUNTER — Ambulatory Visit (INDEPENDENT_AMBULATORY_CARE_PROVIDER_SITE_OTHER): Payer: Medicare Other | Admitting: Physician Assistant

## 2018-07-08 ENCOUNTER — Encounter: Payer: Self-pay | Admitting: Physician Assistant

## 2018-07-08 VITALS — BP 130/50 | HR 74 | Ht 66.0 in | Wt 180.8 lb

## 2018-07-08 DIAGNOSIS — K9049 Malabsorption due to intolerance, not elsewhere classified: Secondary | ICD-10-CM

## 2018-07-08 DIAGNOSIS — E785 Hyperlipidemia, unspecified: Secondary | ICD-10-CM | POA: Diagnosis not present

## 2018-07-08 DIAGNOSIS — I25118 Atherosclerotic heart disease of native coronary artery with other forms of angina pectoris: Secondary | ICD-10-CM | POA: Diagnosis not present

## 2018-07-08 MED ORDER — RANOLAZINE ER 500 MG PO TB12: 500.0000 mg | ORAL_TABLET | Freq: Two times a day (BID) | ORAL | 3 refills | Status: DC

## 2018-07-08 NOTE — Progress Notes (Signed)
Cardiology Office Note:    Date:  07/08/2018   ID:  Donna Bailey, DOB 12/08/1945, MRN [id number]  PCP:  [name], MD  Cardiologist:  Ena Dawley, MD  Electrophysiologist:  None   Referring MD: [name], MD   Chief Complaint  Patient presents with  . Follow-up    CAD    History of Present Illness:    Donna Bailey is a 72 y.o. female with coronary artery disease with angina, ischemic cardiomyopathy acid reflux, Barrett's esophagus, hyperlipidemia, asthma, multiple medication intolerances/allergies.  She was admitted in April 2019 with ST elevation myocardial infarction.  Cardiac catheterization demonstrated 100% thrombotic occlusion of the proximal LAD treated with a drug-eluting stent.  There is also severe stenosis in the first diagonal and ostial LCx.  Cardiac catheterization was complicated by radial artery dissection and hematoma which was treated conservatively.  EF was 40-45% at the time of her MI but improved to normal by Echo in 5/19.  Repeat cardiac catheterization in June 2019 demonstrated a patent stent in the LAD with moderate to severe disease involving the branches of the first diagonal and moderate nonobstructive disease in the RCA.  The diagonal vessel is relatively small and not a good target for PCI.  There was ostial 60% stenosis in the LCx which was not hemodynamically significant by FFR.  Medical therapy was recommended.  She had intolerance to Repatha and was switched to Praluent.  Her anginal symptoms have been controlled on isosorbide and ranolazine.  She was last seen in August 2019.  Of note, she was previously followed by Dr. Wynonia Lawman and transition to Dr. Saunders Revel.  Given Dr. Darnelle Bos transition to [facility], the patient has requested follow-up in the future with Dr. Meda Coffee.    Since last seen, she called in with side effects of shortness of breath related to Brilinta.  I reviewed this with Dr. Saunders Revel.  Given her intolerance to corn filler, we  felt that continuing Brilinta is the best course.  Ms. Principato returns for follow-up.  She has occasional chest pains.  These have been intermittent since her heart attack.  She denies any symptoms reminiscent of her previous angina.  Her symptoms are overall stable.  She has shortness of breath with different types of activities.  However, she can ride her stationary bike without chest pain or shortness of breath.  She does have some difficulty with balance as well as dizziness.  She also notes "shakiness".  She had similar symptoms with Repatha and had to stop this medication.  She is currently on Praluent and wonders if she should stop it.  She denies orthopnea, PND or significant lower extremity swelling.   Prior CV studies:   The following studies were reviewed today:  Cardiac catheterization 01/22/2018 Conclusions: 1. Widely patent stent in mid LAD and stable moderate to severe disease involving branches of D1. 2. Moderate, non-obstructive mid RCA disease. 3. 60% ostial LCx stenosis with minimal luminal area of 5.1 cm^2 by IVUS and FFR 0.85.  Recommendations: 1. LCx stenosis is not severe by angiography and IVUS; FFR is not hemodynamically significant. Continue medical therapy for LCx and diagonal disease. I will increase ranolazine to 1000 mg twice daily. 2. Aggressive secondary prevention, including continuation of at least 12 months of DAPT following STEMI in 11/2017. If dyspnea remains problematic, the patient could be transitioned from ticagrelor to clopidogrel. 3. Continue Repatha for lipid therapy. 4.  Diagnostic Diagram    Echo 12/04/2017 No new wall motion  abnormalities, EF 65, no pericardial effusion, normal aortic root  Echo 11/14/17 EF 40-45, apical ant-sept, inf, inf-sept, apical HK c/w ischemia in the distribution of the LAD  Cardiac Catheterization 11/13/17 LM normal LAD prox 100, D1 80, Lat D1 50 LCx ost 90 RCA mid 25 PCI:  2.75 x 23 mm Sierra DES to LAD           Past Medical History:  Diagnosis Date  . Allergic rhinitis, cause unspecified   . Anemia   . Aneurysm of right conjunctiva    right eye   . Anxiety   . Asthma   . Barrett's esophagus   . CAD in native artery    a. 11/2017: STEMI s/p DES to prox-mid LAD; LCx stenosis managed medically. Case complicated by R forearm hematoma. // LHC 6/19: LAD stent patent, severe dz in branches of D1 (not amenable to PCI), mid RCA 40, LCx ostial 60 (neg by FFR) >> med Rx  . Constipation   . Cystocele   . Deviated nasal septum   . Diaphragmatic hernia without mention of obstruction or gangrene   . Esophageal reflux   . Fibromyalgia   . H/O hiatal hernia   . Insomnia, unspecified   . Ischemic cardiomyopathy    a. EF 40-45% by echo 11/2017. // Echo 5/19: No new wall motion abnormalities, EF 65, no pericardial effusion, normal aortic root  . Kidney stones    hx of pt see Dr. Risa Grill  . Migraine   . Myalgia and myositis, unspecified   . Nuclear stress tests    Cardiolite 2/18: no ischemia or scar, EF 78; Low Risk  . Osteoarthrosis, unspecified whether generalized or localized, unspecified site   . Peripheral neuropathy   . Pneumonia    hx of  . Pure hypercholesterolemia   . Rectocele   . Scoliosis (and kyphoscoliosis), idiopathic   . Statin intolerance   . Temporomandibular joint disorders, unspecified   . Wheat allergy    Surgical Hx: The patient  has a past surgical history that includes right heel repair; Ankle surgery; Tonsillectomy; Appendectomy; Knee arthroscopy (2011); Hand surgery; Temporomandibular joint surgery; Vaginal hysterectomy; Bladder suspension; Cystocele repair; endocele (11/2008); Eye surgery (12/2009); Nasal septum surgery; Inguinal hernia repair (10/08/2011); Upper gastrointestinal endoscopy; Colonoscopy w/ polypectomy; Dental surgery; Radiology with anesthesia (N/A, 04/07/2013); Colonoscopy; Polypectomy; ir generic historical (08/13/2016); ir generic historical (09/12/2016); ir  generic historical (09/30/2016); LEFT HEART CATH AND CORONARY ANGIOGRAPHY (N/A, 11/13/2017); CORONARY STENT INTERVENTION (N/A, 11/13/2017); Intravascular Ultrasound/IVUS (N/A, 01/22/2018); INTRAVASCULAR PRESSURE WIRE/FFR STUDY (N/A, 01/22/2018); and LEFT HEART CATH AND CORONARY ANGIOGRAPHY (N/A, 01/22/2018).   Current Medications: Current Meds  Medication Sig  . albuterol (PROVENTIL) (2.5 MG/3ML) 0.083% nebulizer solution Take 3 mLs (2.5 mg total) by nebulization every 6 (six) hours as needed for wheezing or shortness of breath.  . Alirocumab (PRALUENT) 75 MG/ML SOPN Inject 1 pen into the skin every 14 (fourteen) days.  Marland Kitchen aspirin 81 MG chewable tablet Chew 1 tablet (81 mg total) by mouth daily.  Marland Kitchen atorvastatin (LIPITOR) 10 MG tablet Take 10 mg by mouth daily.  . carvedilol (COREG) 6.25 MG tablet Take 6.25 mg by mouth 2 (two) times daily with a meal.  . Cyanocobalamin (VITAMIN B-12 IJ) Inject 1,000 mg as directed every 30 (thirty) days.  . diphenhydrAMINE (BENADRYL) 25 mg capsule Take 25 mg by mouth every 6 (six) hours as needed for itching or allergies.   Marland Kitchen esomeprazole (NEXIUM) 20 MG capsule Take 20 mg by mouth daily at  12 noon.   . isosorbide mononitrate (IMDUR) 30 MG 24 hr tablet Take 0.5 tablets (15 mg total) by mouth daily.  . nitroGLYCERIN (NITROSTAT) 0.4 MG SL tablet Place 1 tablet (0.4 mg total) under the tongue every 5 (five) minutes as needed for chest pain.  Marland Kitchen ondansetron (ZOFRAN) 4 MG tablet Take 1 tablet (4 mg total) by mouth every 8 (eight) hours as needed for nausea or vomiting.  . promethazine-codeine (PHENERGAN WITH CODEINE) 6.25-10 MG/5ML syrup Take 5 mLs by mouth every 6 (six) hours as needed for cough.  . ticagrelor (BRILINTA) 90 MG TABS tablet Take 1 tablet (90 mg total) by mouth 2 (two) times daily.  Marland Kitchen zolpidem (AMBIEN) 10 MG tablet Take 1 tablet (10 mg total) by mouth at bedtime.  . [DISCONTINUED] ranolazine (RANEXA) 1000 MG SR tablet Take 1 tablet (1,000 mg total) by mouth 2  (two) times daily.     Allergies:   Sulfa antibiotics; Sulfonamide derivatives; Penicillins; Allegra [fexofenadine]; Avelox [moxifloxacin]; Caffeine; Cephalexin; Ciprofloxacin; Corn-containing products; Crestor [rosuvastatin]; Doxycycline; Formoterol fumarate; Gold-containing drug products; Latex; Levofloxacin; Macrodantin [nitrofurantoin macrocrystal]; Neomycin sulfate [neomycin]; Ofloxacin; Other; Adhesive [tape]; Ibuprofen; Mold extract [trichophyton mentagrophyte]; Nickel; and Tylenol [acetaminophen]   Social History   Tobacco Use  . Smoking status: Never Smoker  . Smokeless tobacco: Never Used  Substance Use Topics  . Alcohol use: No    Alcohol/week: 0.0 standard drinks  . Drug use: No     Family Hx: The patient's family history includes Breast cancer in her sister; Cancer in her sister; Cancer (age of onset: 26) in her sister; Colitis in her mother; Colon polyps in her brother; Diverticulosis in her mother; Heart disease in her father and mother; Leukemia in her sister; Tuberculosis in her brother. There is no history of Colon cancer, Esophageal cancer, Rectal cancer, or Stomach cancer.  ROS:   Please see the history of present illness.    Review of Systems  Neurological: Positive for loss of balance.   All other systems reviewed and are negative.   EKGs/Labs/Other Test Reviewed:    EKG:  EKG is  ordered today.  The ekg ordered today demonstrates normal sinus rhythm, heart rate 67, normal axis, septal Q waves, QTC 420, no change from prior tracing  Recent Labs: 12/03/2017: B Natriuretic Peptide 370.5 04/25/2018: BUN 12; Creatinine, Ser 1.10; Hemoglobin 12.0; Platelets 296; Potassium 4.3; Sodium 138 06/01/2018: ALT 14   Recent Lipid Panel Lab Results  Component Value Date/Time   CHOL 140 06/01/2018 09:41 AM   TRIG 128 06/01/2018 09:41 AM   HDL 65 06/01/2018 09:41 AM   CHOLHDL 2.2 06/01/2018 09:41 AM   CHOLHDL 4.9 12/02/2017 09:33 PM   LDLCALC 49 06/01/2018 09:41 AM     Physical Exam:    VS:  BP (!) 130/50   Pulse 74   Ht 5\' 6"  (1.676 m)   Wt 180 lb 12.8 oz (82 kg)   SpO2 98%   BMI 29.18 kg/m     Wt Readings from Last 3 Encounters:  07/08/18 180 lb 12.8 oz (82 kg)  05/05/18 165 lb (74.8 kg)  04/27/18 174 lb 12.8 oz (79.3 kg)     Physical Exam  Constitutional: She is oriented to person, place, and time. She appears well-developed and well-nourished. No distress.  HENT:  Head: Normocephalic and atraumatic.  Eyes: No scleral icterus.  Neck: No JVD present. No thyromegaly present.  Cardiovascular: Normal rate and regular rhythm.  No murmur heard. Pulmonary/Chest: Effort normal. She has no  rales.  Abdominal: Soft.  Musculoskeletal: She exhibits no edema.  Lymphadenopathy:    She has no cervical adenopathy.  Neurological: She is alert and oriented to person, place, and time.  Skin: Skin is warm and dry.  Psychiatric: She has a normal mood and affect.    ASSESSMENT & PLAN:    Coronary artery disease of native artery of native heart with stable angina pectoris ([place]) History of ST elevation myocardial infarction in April 2019 treated with drug-eluting stent to the LAD.  She has significant disease and a very small diagonal branches not amenable to PCI.  She has moderate nonobstructive disease in the LCx.  Cardiac catheterization in June 2019 demonstrated no hemodynamic significance by FFR in the LCx.  She also has moderate nonobstructive disease in the RCA.    She currently has stable anginal symptoms.  She has intolerance to corn fillers and medication options are limited.  She seems to have side effects of shortness of breath from Brilinta.  However, options for changing to Plavix are limited due to her food intolerance.  Therefore, I have recommended that she remain on Brilinta.  She can likely stop this in April when she is 1 year out from her myocardial infarction.  She is having a lot of dizziness, imbalance and issues with "shakiness".   Question if she may be having side effects to Ranexa.  I have asked her to reduce the dose to see if this helps.  If not, we can try her off of Praluent to see if it helps.  -Continue aspirin, Brilinta, carvedilol, isosorbide  -Decrease Ranexa 500 mg twice daily  -Keep follow-up with Dr. Meda Coffee in January  Hyperlipidemia LDL goal <70 Recent lipids optimal.  She questions whether or not she may be having intolerances to Praluent.  If she does not have improved symptoms on lower dose Ranexa, she can hold her Praluent for 1 month.  If her symptoms clearly improve off of Praluent, we will need to send her back to the lipid clinic for further recommendations.  Food intolerance As noted, medication choices are limited due to her corn filler intolerance.  Currently, she is tolerating most of her medications.   Dispo:  Return in about 8 weeks (around 09/01/2018) for Routine Follow Up with Dr. Meda Coffee.   Medication Adjustments/Labs and Tests Ordered: Current medicines are reviewed at length with the patient today.  Concerns regarding medicines are outlined above.  Tests Ordered: Orders Placed This Encounter  Procedures  . EKG 12-Lead   Medication Changes: Meds ordered this encounter  Medications  . ranolazine (RANEXA) 500 MG 12 hr tablet    Sig: Take 1 tablet (500 mg total) by mouth 2 (two) times daily.    Dispense:  180 tablet    Refill:  3    DECREASE IN DOSE    Danton Sewer, PA-C  07/08/2018 12:05 PM    [address], [place], [place]  [address] Phone: [phone]; Fax: [phone]

## 2018-07-08 NOTE — Patient Instructions (Signed)
Medication Instructions:  1. DECREASE RANEXA 500 MG TWICE DAILY; HOWEVER DO NOT GET RID OF THE RANEXA 1000 MG TABLET JUST IN CASE WE DO GO GO BACK UP If you need a refill on your cardiac medications before your next appointment, please call your pharmacy.   Lab work: NONE [place] If you have labs (blood work) drawn today and your tests are completely normal, you will receive your results only by: Marland Kitchen MyChart Message (if you have MyChart) OR . A paper copy in the mail If you have any lab test that is abnormal or we need to change your treatment, we will call you to review the results.  Testing/Procedures: NONE ORDERED TODAY  Follow-Up: At [facility], you and your health needs are our priority.  As part of our continuing mission to provide you with exceptional heart care, we have created designated Provider Care Teams.  These Care Teams include your primary Cardiologist (physician) and Advanced Practice Providers (APPs -  Physician Assistants and Nurse Practitioners) who all work together to provide you with the care you need, when you need it. Marland Kitchen PLEASE KEEP YOUR APPT WITH DR. Meda Coffee 09/01/18  Any Other Special Instructions Will Be Listed Below (If Applicable). IF YOUR BALANCE ISSUES/DIZZINESS ARE NOT ANY BETTER AFTER THE DECREASE IN RANEXA  YOU WILL THEN HOLD PRALUENT FOR 1 MONTH AND DISCUSS WITH DR. Meda Coffee AT YOUR APPT.

## 2018-07-14 ENCOUNTER — Other Ambulatory Visit: Payer: Self-pay | Admitting: Internal Medicine

## 2018-07-14 NOTE — Telephone Encounter (Signed)
Refill Request.  

## 2018-08-03 ENCOUNTER — Encounter

## 2018-08-05 ENCOUNTER — Encounter: Payer: Self-pay | Admitting: Cardiology

## 2018-08-18 DIAGNOSIS — M25562 Pain in left knee: Secondary | ICD-10-CM | POA: Insufficient documentation

## 2018-08-20 ENCOUNTER — Telehealth: Payer: Self-pay | Admitting: Pharmacist

## 2018-08-20 NOTE — Telephone Encounter (Signed)
Attempted to call pt to let her know the PA for Praluent was approved through 08/04/2019. Left message for pt to return call

## 2018-08-25 ENCOUNTER — Telehealth: Payer: Self-pay | Admitting: Pharmacist

## 2018-08-25 NOTE — Telephone Encounter (Signed)
Patient states she is no longer on Praluent. Thinks she stopped around the end of December. States she was shaking just like with Repatha. Her PCP suggested stopping to see if it improves. She states it has gone away. Offered her an appointment in the lipid clinic to discuss other treatment options such Bempedoic acid which will be released soon. She is currently on low dose atorvastatin-tolerating fine. She has corn allergy which makes taking pills difficult because the filler has to be free of corn product.  Patient has an appointment with Dr. Lovena Le next week and she prefers to discuss with her first.  Higher doses of atorvastatin or zetia are still an option for patient. Would be happy to discuss with patient if Dr. Lovena Le wishes to refer back to clinic.

## 2018-09-01 ENCOUNTER — Encounter: Payer: Self-pay | Admitting: Cardiology

## 2018-09-01 ENCOUNTER — Encounter (INDEPENDENT_AMBULATORY_CARE_PROVIDER_SITE_OTHER): Payer: Self-pay

## 2018-09-01 ENCOUNTER — Encounter: Payer: Self-pay | Admitting: *Deleted

## 2018-09-01 ENCOUNTER — Ambulatory Visit (INDEPENDENT_AMBULATORY_CARE_PROVIDER_SITE_OTHER): Payer: Medicare Other | Admitting: Cardiology

## 2018-09-01 VITALS — BP 120/76 | HR 64 | Ht 66.0 in | Wt 180.4 lb

## 2018-09-01 DIAGNOSIS — R0609 Other forms of dyspnea: Secondary | ICD-10-CM

## 2018-09-01 DIAGNOSIS — E782 Mixed hyperlipidemia: Secondary | ICD-10-CM | POA: Diagnosis not present

## 2018-09-01 DIAGNOSIS — I10 Essential (primary) hypertension: Secondary | ICD-10-CM | POA: Diagnosis not present

## 2018-09-01 DIAGNOSIS — I25118 Atherosclerotic heart disease of native coronary artery with other forms of angina pectoris: Secondary | ICD-10-CM | POA: Diagnosis not present

## 2018-09-01 DIAGNOSIS — R072 Precordial pain: Secondary | ICD-10-CM

## 2018-09-01 DIAGNOSIS — E785 Hyperlipidemia, unspecified: Secondary | ICD-10-CM

## 2018-09-01 MED ORDER — CARVEDILOL 6.25 MG PO TABS: 6.2500 mg | ORAL_TABLET | Freq: Two times a day (BID) | ORAL | 2 refills | Status: DC

## 2018-09-01 MED ORDER — TICAGRELOR 90 MG PO TABS: 90.0000 mg | ORAL_TABLET | Freq: Two times a day (BID) | ORAL | 11 refills | Status: DC

## 2018-09-01 MED ORDER — ATORVASTATIN CALCIUM 10 MG PO TABS: 10.0000 mg | ORAL_TABLET | Freq: Every day | ORAL | 1 refills | Status: DC

## 2018-09-01 NOTE — Patient Instructions (Signed)
Medication Instructions:   STOP TAKING IMDUR (ISOSORBIDE MONONITRATE)  If you need a refill on your cardiac medications before your next appointment, please call your pharmacy.      Testing/Procedures:  Your physician has requested that you have a lexiscan myoview. For further information please visit [email or web address]. Please follow instruction sheet, as given.      Follow-Up:  4 MONTHS WITH AN EXTENDER ON DR NELSON'S TEAM

## 2018-09-01 NOTE — Progress Notes (Signed)
Cardiology Office Note:    Date:  09/01/2018   ID:  Donna Bailey, DOB 1945/09/01, MRN [id number]  PCP:  [name], MD  Cardiologist:  Ena Dawley, MD  Electrophysiologist:  None   Referring MD: [name], MD   Chief complaint: Shortness of breath  History of Present Illness:    Donna Bailey is a 73 y.o. female with coronary artery disease with angina, ischemic cardiomyopathy acid reflux, Barrett's esophagus, hyperlipidemia, asthma, multiple medication intolerances/allergies.  She was admitted in April 2019 with ST elevation myocardial infarction.  Cardiac catheterization demonstrated 100% thrombotic occlusion of the proximal LAD treated with a drug-eluting stent.  There is also severe stenosis in the first diagonal and ostial LCx.  Cardiac catheterization was complicated by radial artery dissection and hematoma which was treated conservatively.  EF was 40-45% at the time of her MI but improved to 60-65 by Echo in 5/19.  Repeat cardiac catheterization in June 2019 demonstrated a patent stent in the LAD with moderate to severe disease involving the branches of the first diagonal and moderate nonobstructive disease in the RCA.  The diagonal vessel is relatively small and not a good target for PCI.  There was ostial 60% stenosis in the LCx which was not hemodynamically significant by FFR.  Medical therapy was recommended.  She had intolerance to Repatha and was switched to Praluent.  Her anginal symptoms have been controlled on isosorbide and ranolazine.  She was last seen in August 2019.  Of note, she was previously followed by Dr. Wynonia Lawman and transition to Dr. Saunders Revel.  Given Dr. Darnelle Bos transition to [facility], the patient has requested follow-up in the future with Dr. Meda Coffee.   Since last seen, she called in with side effects of shortness of breath related to Brilinta.  I reviewed this with Dr. Saunders Revel.  Given her intolerance to corn filler, we felt that continuing Brilinta is  the best course.  09/01/2018 -she states that she has no energy and even with housework she has to break it into multiple sessions to be able to complete tasks such as sweeping the floor.  However she is absolutely minimally active, does not walk and states multiple reason why she cannot do so, does not exercise, sits around for most of the day.  She has been tolerating her medications.  She has no side effects.  She is also suffering from fibromyalgia and points to 12 points on her body that are currently painful but do not seem to be related to possible statin myalgia but rather old injuries and arthralgias.  Prior CV studies:   The following studies were reviewed today:  Cardiac catheterization 01/22/2018 Conclusions: 1. Widely patent stent in mid LAD and stable moderate to severe disease involving branches of D1. 2. Moderate, non-obstructive mid RCA disease. 3. 60% ostial LCx stenosis with minimal luminal area of 5.1 cm^2 by IVUS and FFR 0.85.  Recommendations: 1. LCx stenosis is not severe by angiography and IVUS; FFR is not hemodynamically significant. Continue medical therapy for LCx and diagonal disease. I will increase ranolazine to 1000 mg twice daily. 2. Aggressive secondary prevention, including continuation of at least 12 months of DAPT following STEMI in 11/2017. If dyspnea remains problematic, the patient could be transitioned from ticagrelor to clopidogrel. 3. Continue Repatha for lipid therapy. 4.  Diagnostic Diagram    Echo 12/04/2017 No new wall motion abnormalities, EF 65, no pericardial effusion, normal aortic root  Echo 11/14/17 EF 40-45, apical ant-sept, inf, inf-sept,  apical HK c/w ischemia in the distribution of the LAD  Cardiac Catheterization 11/13/17 LM normal LAD prox 100, D1 80, Lat D1 50 LCx ost 90 RCA mid 25 PCI:  2.75 x 23 mm Sierra DES to LAD         Past Medical History:  Diagnosis Date  . Allergic rhinitis, cause unspecified   . Anemia   .  Aneurysm of right conjunctiva    right eye   . Anxiety   . Asthma   . Barrett's esophagus   . CAD in native artery    a. 11/2017: STEMI s/p DES to prox-mid LAD; LCx stenosis managed medically. Case complicated by R forearm hematoma. // LHC 6/19: LAD stent patent, severe dz in branches of D1 (not amenable to PCI), mid RCA 40, LCx ostial 60 (neg by FFR) >> med Rx  . Constipation   . Cystocele   . Deviated nasal septum   . Diaphragmatic hernia without mention of obstruction or gangrene   . Esophageal reflux   . Fibromyalgia   . H/O hiatal hernia   . Insomnia, unspecified   . Ischemic cardiomyopathy    a. EF 40-45% by echo 11/2017. // Echo 5/19: No new wall motion abnormalities, EF 65, no pericardial effusion, normal aortic root  . Kidney stones    hx of pt see Dr. Risa Grill  . Migraine   . Myalgia and myositis, unspecified   . Nuclear stress tests    Cardiolite 2/18: no ischemia or scar, EF 78; Low Risk  . Osteoarthrosis, unspecified whether generalized or localized, unspecified site   . Peripheral neuropathy   . Pneumonia    hx of  . Pure hypercholesterolemia   . Rectocele   . Scoliosis (and kyphoscoliosis), idiopathic   . Statin intolerance   . Temporomandibular joint disorders, unspecified   . Wheat allergy    Surgical Hx: The patient  has a past surgical history that includes right heel repair; Ankle surgery; Tonsillectomy; Appendectomy; Knee arthroscopy (2011); Hand surgery; Temporomandibular joint surgery; Vaginal hysterectomy; Bladder suspension; Cystocele repair; endocele (11/2008); Eye surgery (12/2009); Nasal septum surgery; Inguinal hernia repair (10/08/2011); Upper gastrointestinal endoscopy; Colonoscopy w/ polypectomy; Dental surgery; Radiology with anesthesia (N/A, 04/07/2013); Colonoscopy; Polypectomy; ir generic historical (08/13/2016); ir generic historical (09/12/2016); ir generic historical (09/30/2016); LEFT HEART CATH AND CORONARY ANGIOGRAPHY (N/A, 11/13/2017); CORONARY STENT  INTERVENTION (N/A, 11/13/2017); Intravascular Ultrasound/IVUS (N/A, 01/22/2018); INTRAVASCULAR PRESSURE WIRE/FFR STUDY (N/A, 01/22/2018); and LEFT HEART CATH AND CORONARY ANGIOGRAPHY (N/A, 01/22/2018).   Current Medications: Current Meds  Medication Sig  . albuterol (PROVENTIL) (2.5 MG/3ML) 0.083% nebulizer solution Take 3 mLs (2.5 mg total) by nebulization every 6 (six) hours as needed for wheezing or shortness of breath.  Marland Kitchen aspirin 81 MG chewable tablet Chew 1 tablet (81 mg total) by mouth daily.  Marland Kitchen atorvastatin (LIPITOR) 10 MG tablet Take 1 tablet (10 mg total) by mouth daily.  . carvedilol (COREG) 6.25 MG tablet Take 1 tablet (6.25 mg total) by mouth 2 (two) times daily with a meal.  . Cyanocobalamin (VITAMIN B-12 IJ) Inject 1,000 mg as directed every 30 (thirty) days.  . diphenhydrAMINE (BENADRYL) 25 mg capsule Take 25 mg by mouth every 6 (six) hours as needed for itching or allergies.   Marland Kitchen esomeprazole (NEXIUM) 20 MG capsule Take 20 mg by mouth daily at 12 noon.   . nitroGLYCERIN (NITROSTAT) 0.4 MG SL tablet Place 1 tablet (0.4 mg total) under the tongue every 5 (five) minutes as needed for chest pain.  Marland Kitchen  ondansetron (ZOFRAN) 4 MG tablet Take 1 tablet (4 mg total) by mouth every 8 (eight) hours as needed for nausea or vomiting.  . promethazine-codeine (PHENERGAN WITH CODEINE) 6.25-10 MG/5ML syrup Take 5 mLs by mouth every 6 (six) hours as needed for cough.  . ranolazine (RANEXA) 500 MG 12 hr tablet Take 1 tablet (500 mg total) by mouth 2 (two) times daily.  . ticagrelor (BRILINTA) 90 MG TABS tablet Take 1 tablet (90 mg total) by mouth 2 (two) times daily.  Marland Kitchen zolpidem (AMBIEN) 10 MG tablet Take 1 tablet (10 mg total) by mouth at bedtime.  . [DISCONTINUED] atorvastatin (LIPITOR) 10 MG tablet Take 10 mg by mouth daily.  . [DISCONTINUED] carvedilol (COREG) 6.25 MG tablet Take 6.25 mg by mouth 2 (two) times daily with a meal.  . [DISCONTINUED] ticagrelor (BRILINTA) 90 MG TABS tablet Take 1 tablet (90  mg total) by mouth 2 (two) times daily.     Allergies:   Sulfa antibiotics; Sulfonamide derivatives; Penicillins; Allegra [fexofenadine]; Avelox [moxifloxacin]; Caffeine; Cephalexin; Ciprofloxacin; Corn-containing products; Crestor [rosuvastatin]; Doxycycline; Formoterol fumarate; Gold-containing drug products; Latex; Levofloxacin; Macrodantin [nitrofurantoin macrocrystal]; Neomycin sulfate [neomycin]; Ofloxacin; Other; Adhesive [tape]; Ibuprofen; Mold extract [trichophyton mentagrophyte]; Nickel; and Tylenol [acetaminophen]   Social History   Tobacco Use  . Smoking status: Never Smoker  . Smokeless tobacco: Never Used  Substance Use Topics  . Alcohol use: No    Alcohol/week: 0.0 standard drinks  . Drug use: No     Family Hx: The patient's family history includes Breast cancer in her sister; Cancer in her sister; Cancer (age of onset: 30) in her sister; Colitis in her mother; Colon polyps in her brother; Diverticulosis in her mother; Heart disease in her father and mother; Leukemia in her sister; Tuberculosis in her brother. There is no history of Colon cancer, Esophageal cancer, Rectal cancer, or Stomach cancer.  ROS:   Please see the history of present illness.    Review of Systems  Neurological: Positive for loss of balance.   All other systems reviewed and are negative.   EKGs/Labs/Other Test Reviewed:    EKG:  EKG is  ordered today.  The ekg ordered today demonstrates normal sinus rhythm, heart rate 67, normal axis, septal Q waves, QTC 420, no change from prior tracing  Recent Labs: 12/03/2017: B Natriuretic Peptide 370.5 04/25/2018: BUN 12; Creatinine, Ser 1.10; Hemoglobin 12.0; Platelets 296; Potassium 4.3; Sodium 138 06/01/2018: ALT 14   Recent Lipid Panel Lab Results  Component Value Date/Time   CHOL 140 06/01/2018 09:41 AM   TRIG 128 06/01/2018 09:41 AM   HDL 65 06/01/2018 09:41 AM   CHOLHDL 2.2 06/01/2018 09:41 AM   CHOLHDL 4.9 12/02/2017 09:33 PM   LDLCALC 49  06/01/2018 09:41 AM    Physical Exam:    VS:  BP 120/76   Pulse 64   Ht 5\' 6"  (1.676 m)   Wt 180 lb 6.4 oz (81.8 kg)   SpO2 98%   BMI 29.12 kg/m     Wt Readings from Last 3 Encounters:  09/01/18 180 lb 6.4 oz (81.8 kg)  07/08/18 180 lb 12.8 oz (82 kg)  05/05/18 165 lb (74.8 kg)     Physical Exam  Constitutional: She is oriented to person, place, and time. She appears well-developed and well-nourished. No distress.  HENT:  Head: Normocephalic and atraumatic.  Eyes: No scleral icterus.  Neck: No JVD present. No thyromegaly present.  Cardiovascular: Normal rate and regular rhythm.  No murmur heard. Pulmonary/Chest: Effort  normal. She has no rales.  Abdominal: Soft.  Musculoskeletal:        General: No edema.  Lymphadenopathy:    She has no cervical adenopathy.  Neurological: She is alert and oriented to person, place, and time.  Skin: Skin is warm and dry.  Psychiatric: She has a normal mood and affect.    ASSESSMENT & PLAN:    Coronary artery disease of native artery of native heart with stable angina pectoris ([place]) History of ST elevation myocardial infarction in April 2019 treated with drug-eluting stent to the LAD.  She has significant disease and a very small diagonal branches not amenable to PCI.  She has moderate nonobstructive disease in the LCx.  Cardiac catheterization in June 2019 demonstrated no hemodynamic significance by FFR in the LCx.  She also has moderate nonobstructive disease in the RCA.      She complains of significant fatigue, I will obtain the SPECT nuclear scan, however I am suspicious this is related to complete inactivity and deconditioning.   -We will continue Brilinta for now she seems to be tolerated better.   - Continue aspirin, Brilinta, carvedilol, isosorbide, Ranexa  Hyperlipidemia LDL goal <70 Lipids at goal  Hypertension At goal.  Food intolerance As noted, medication choices are limited due to her corn filler intolerance, this  includes Plavix.  Currently, she is tolerating most of her medications.   Dispo:  No follow-ups on file.   Medication Adjustments/Labs and Tests Ordered: Current medicines are reviewed at length with the patient today.  Concerns regarding medicines are outlined above.  Tests Ordered: Orders Placed This Encounter  Procedures  . Myocardial Perfusion Imaging   Medication Changes: Meds ordered this encounter  Medications  . ticagrelor (BRILINTA) 90 MG TABS tablet    Sig: Take 1 tablet (90 mg total) by mouth 2 (two) times daily.    Dispense:  60 tablet    Refill:  11  . carvedilol (COREG) 6.25 MG tablet    Sig: Take 1 tablet (6.25 mg total) by mouth 2 (two) times daily with a meal.    Dispense:  180 tablet    Refill:  2  . atorvastatin (LIPITOR) 10 MG tablet    Sig: Take 1 tablet (10 mg total) by mouth daily.    Dispense:  90 tablet    Refill:  1    Signed, Ena Dawley, MD  09/01/2018 12:41 PM    [address], [place], [place]  [address] Phone: [phone]; Fax: [phone]

## 2018-09-07 ENCOUNTER — Telehealth (HOSPITAL_COMMUNITY): Payer: Self-pay

## 2018-09-07 NOTE — Telephone Encounter (Signed)
Patient contacted and detailed instructions given, pt stated she understood and would be here for her test. S.Naftoli Penny EMTP

## 2018-09-09 ENCOUNTER — Ambulatory Visit (HOSPITAL_COMMUNITY): Payer: Medicare Other | Attending: Cardiology

## 2018-09-09 DIAGNOSIS — I10 Essential (primary) hypertension: Secondary | ICD-10-CM | POA: Diagnosis present

## 2018-09-09 DIAGNOSIS — R0609 Other forms of dyspnea: Secondary | ICD-10-CM | POA: Insufficient documentation

## 2018-09-09 DIAGNOSIS — E785 Hyperlipidemia, unspecified: Secondary | ICD-10-CM | POA: Insufficient documentation

## 2018-09-09 DIAGNOSIS — R072 Precordial pain: Secondary | ICD-10-CM | POA: Insufficient documentation

## 2018-09-09 DIAGNOSIS — I25118 Atherosclerotic heart disease of native coronary artery with other forms of angina pectoris: Secondary | ICD-10-CM | POA: Diagnosis not present

## 2018-09-09 DIAGNOSIS — E782 Mixed hyperlipidemia: Secondary | ICD-10-CM

## 2018-09-09 LAB — MYOCARDIAL PERFUSION IMAGING
LV dias vol: 61 mL (ref 46–106)
LV sys vol: 19 mL
Peak HR: 90 {beats}/min
Rest HR: 62 {beats}/min
SDS: 0
SRS: 0
SSS: 0
TID: 1.01

## 2018-09-09 MED ORDER — TECHNETIUM TC 99M TETROFOSMIN IV KIT
32.5000 | PACK | Freq: Once | INTRAVENOUS | Status: AC | PRN
  Administered 2018-09-09: 32.5 via INTRAVENOUS
  Filled 2018-09-09: qty 33

## 2018-09-09 MED ORDER — TECHNETIUM TC 99M TETROFOSMIN IV KIT
10.1000 | PACK | Freq: Once | INTRAVENOUS | Status: AC | PRN
  Administered 2018-09-09: 10.1 via INTRAVENOUS
  Filled 2018-09-09: qty 11

## 2018-09-09 MED ORDER — REGADENOSON 0.4 MG/5ML IV SOLN
0.4000 mg | Freq: Once | INTRAVENOUS | Status: AC
  Administered 2018-09-09: 0.4 mg via INTRAVENOUS

## 2018-09-10 DIAGNOSIS — M79672 Pain in left foot: Secondary | ICD-10-CM | POA: Insufficient documentation

## 2018-09-19 IMAGING — US US ABDOMEN COMPLETE
1 series · 13 of 25 positions shown · non-contrast
Comparison: Abdominal and pelvic CT scan June 11, 2017 and
abdominal ultrasound June 12, 2016.

CLINICAL DATA: Right upper quadrant abdominal pain associated with
nausea for the past month.

EXAM:
ABDOMEN ULTRASOUND COMPLETE

[Series 1: us abdomen complete · 0.15mm/px · 13 of 109 slices shown]
[im 1/109]
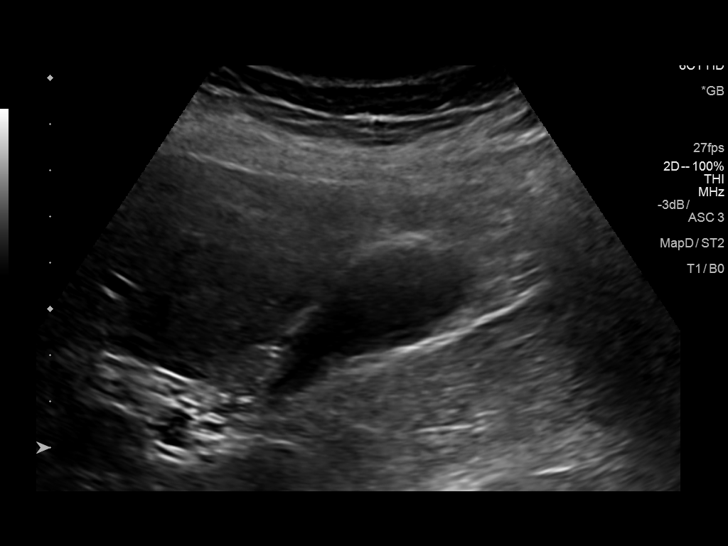
[im 10/109]
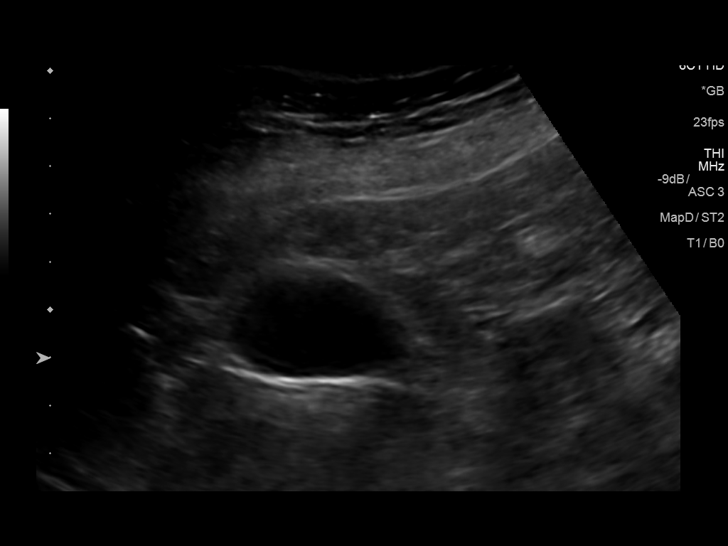
[im 19/109]
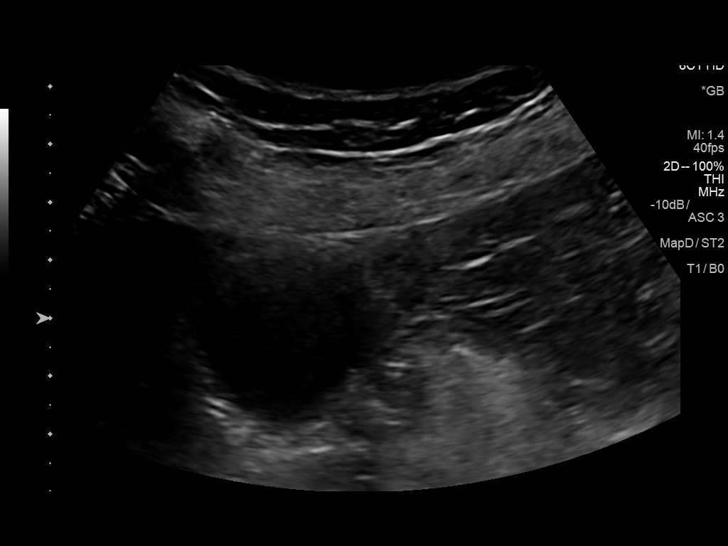
[im 28/109]
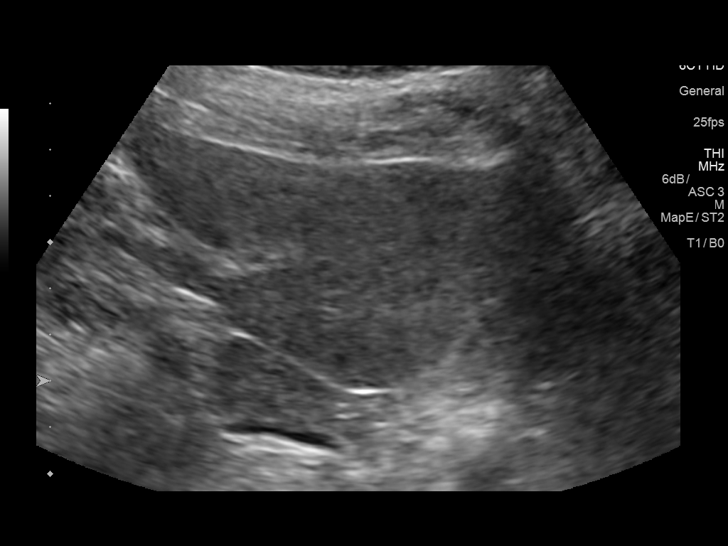
[im 37/109]
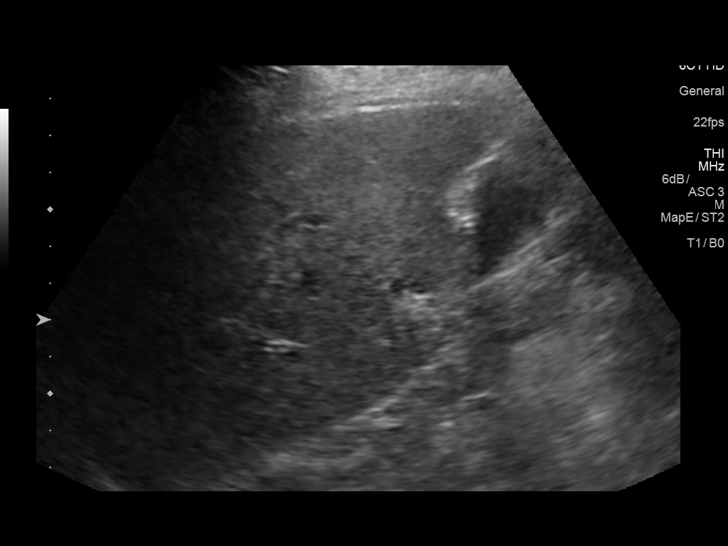
[im 46/109]
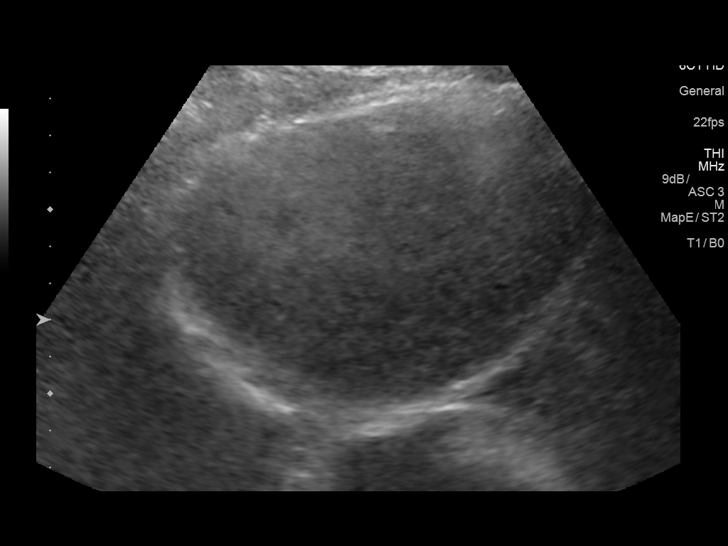
[im 55/109]
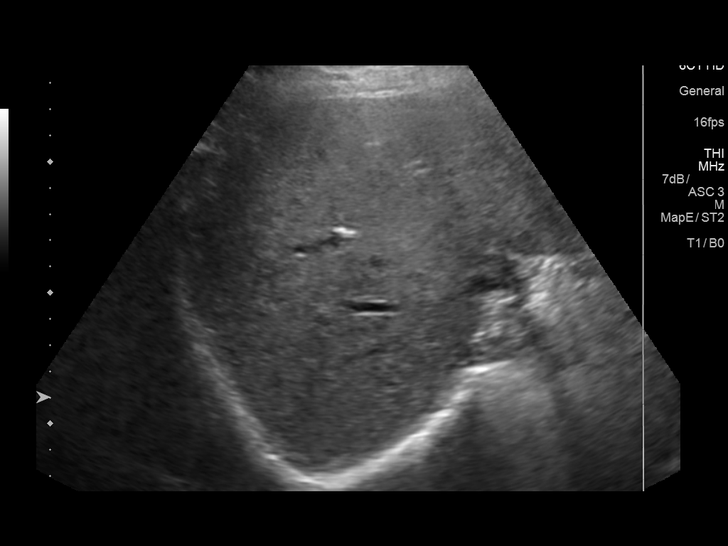
[im 64/109]
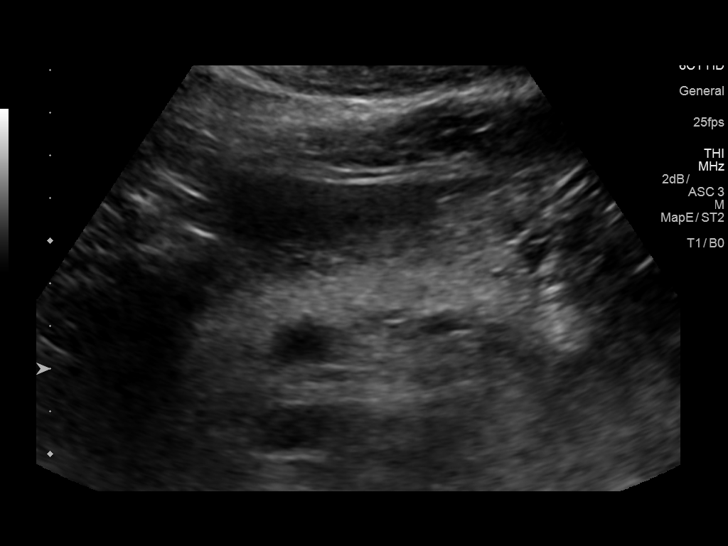
[im 73/109]
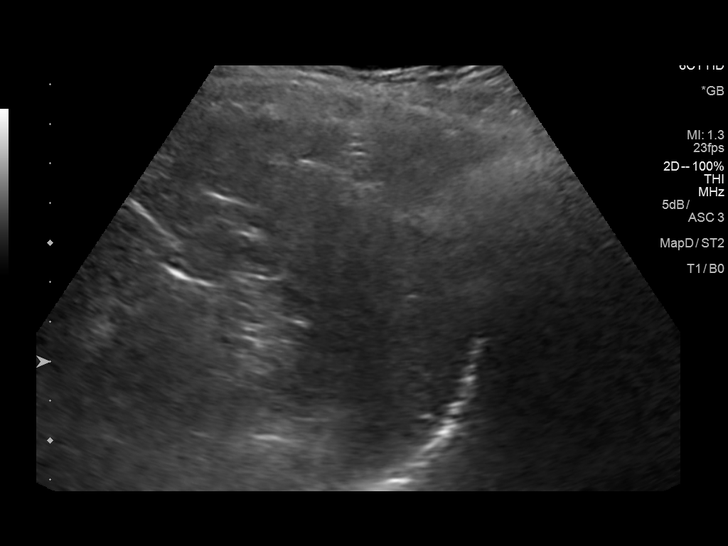
[im 82/109]
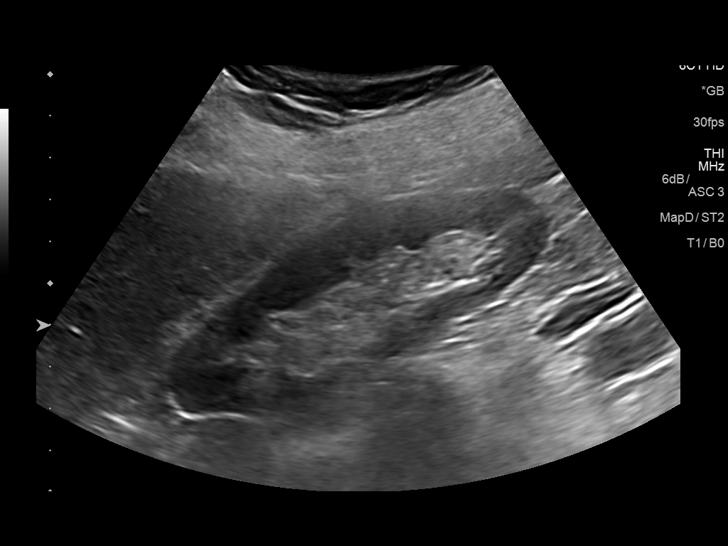
[im 91/109]
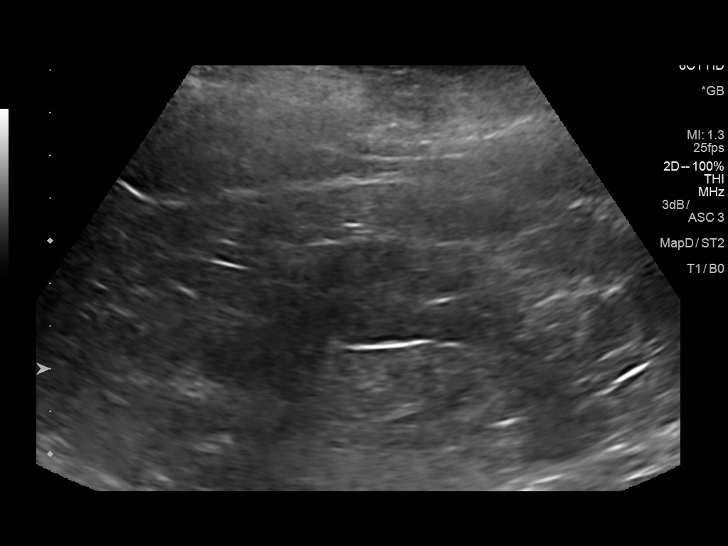
[im 100/109]
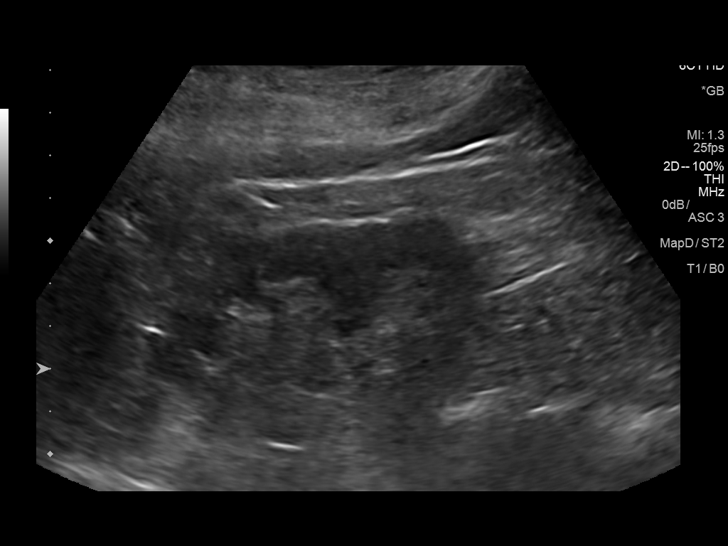
[im 109/109]
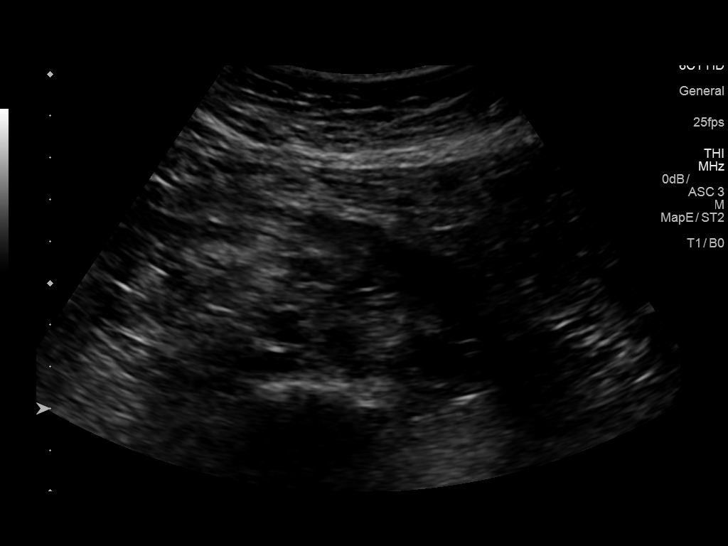

[13 of 25 positions shown; findings below may reference images not displayed]

FINDINGS: Gallbladder: No gallstones or wall thickening visualized. No
sonographic Murphy sign noted by sonographer.

Common bile duct: Diameter: 3.4 mm

Liver: No focal lesion identified. Within normal limits in
parenchymal echogenicity. Portal vein is patent on color Doppler
imaging with normal direction of blood flow towards the liver.

IVC: No abnormality visualized.

Pancreas: Visualized portion unremarkable.

Spleen: Size and appearance within normal limits.

Right Kidney: Length: 10 cm. Echogenicity within normal limits. No
mass or hydronephrosis visualized.

Left Kidney: Length: 10.7 cm. Echogenicity within normal limits. No
mass or hydronephrosis visualized.

Abdominal aorta: No aneurysm visualized.

Other findings: None.
IMPRESSION: Normal abdominal ultrasound examination. If there are clinical
concerns of chronic cholecystitis, a nuclear medicine hepatobiliary
scan with gallbladder ejection fraction determination may be useful.

## 2018-10-07 ENCOUNTER — Telehealth: Payer: Self-pay | Admitting: Cardiology

## 2018-10-07 MED ORDER — VALSARTAN 40 MG PO TABS: 20.0000 mg | ORAL_TABLET | Freq: Every day | ORAL | 0 refills | Status: DC

## 2018-10-07 NOTE — Telephone Encounter (Signed)
Pt calling in, as instructed by her Urologist at [facility], to arrange an appt with an APP or Dr Meda Coffee within the next week, for noted HTN in their office, and c/o CP on early Wed morning.  Pt states that around 2:15 am on Wednesday morning, she was asleep and woke up urgently with chest discomfort.  Pt states she took 1 nitro thereafter, and this was relieved.  Pt states that she went in to see her Urologist today and they took her BP several times, and both readings came back elevated.  Pt states her high BP could be the reason for her CP on Wednesday morning. Pt states that the Urologist noted her 2 readings this morning as 159/100 and 20 mins later 165/85.  Pt recently had a myoview done by Dr Meda Coffee on 09/09/18, and results came back completely normal, with no ischemia, normal LVEF, and was advised to start a regular exercise regimen.  Pt states she is being compliant with taking all her meds prescribed.   She is not exercising regularly.  Pt is not having active chest pain, sob, dizziness, blurred vision, doe, N/V, pre-syncopal or syncopal episodes at this time.  Pt states maybe her med regimen needs to be readjusted. Scheduled the pt a next available appt with Melina Copa PA-C on next Monday 10/11/18 at 0800.  Pt aware to arrive 15 mins prior to that appt.  Advised the pt that in the meantime, she should continue her current med regimen, maintain a very low sodium diet, walk daily if tolerated, and use nitro only as needed (pt education provided on how to use this med correctly).  Advised the pt that if she becomes symptomatic with acute CP, sob, doe, N/V, diaphoresis, dizziness, blurred vision, pre-syncopal or syncopal episodes, then she should report to the ER immediately or call 911.  Informed the pt that I will route this message to Dr Meda Coffee for further review and recommendation, and make her aware that she will be coming to see Dayna on next Monday. Informed the pt that I will follow-up with  her thereafter, once recommendations received. Pt verbalized understanding and agrees with this plan.

## 2018-10-07 NOTE — Telephone Encounter (Signed)
° °  Pt c/o BP issue: STAT if pt c/o blurred vision, one-sided weakness or slurred speech  1. What are your last 5 BP readings? 159/100, 165/85  2. Are you having any other symptoms (ex. Dizziness, headache, blurred vision, passed out)? Chest pain, like a stabbing feeling. Took a nitroglycerine at 2am Wednesday. Waited a day but is still in pain  3. What is your BP issue? [facility] is [facility], and they took her BP, and they said they thought her BP was elevated, and was advised to call her cardiologist.

## 2018-10-07 NOTE — Telephone Encounter (Signed)
Can we start her on valsartan 20 mg po daily?

## 2018-10-07 NOTE — Telephone Encounter (Signed)
Spoke with the pt and informed her that per Dr Meda Coffee, we will start her on Valsartan 20 mg po daily, and she should follow-up as planned with Dayna on next Monday 3/9.  Endorsed to the pt that we will not send in a full 90 day supply, to make sure she tolerates this appropriately.  Also endorsed to the pt that she will need to split these tablets in 1/2, for this comes in 40 mg tabs and we only want her to take 20 mg po daily. Informed the pt that I wrote a note to her pharmacy to split these for her, and if they don't she can ask and they will do it, or she can split them herself.  Advised the pt that if she has any issues with the med, she should call our office back to inform us of this, for she has known history of being sensitive to new med starts.  Pt verbalized understanding and agrees with this plan.

## 2018-10-09 ENCOUNTER — Encounter: Payer: Self-pay | Admitting: Physician Assistant
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
## 2018-10-11 ENCOUNTER — Ambulatory Visit (INDEPENDENT_AMBULATORY_CARE_PROVIDER_SITE_OTHER): Payer: Medicare Other | Admitting: Physician Assistant

## 2018-10-11 ENCOUNTER — Encounter: Payer: Self-pay | Admitting: Physician Assistant

## 2018-10-11 VITALS — BP 154/82 | HR 64 | Ht 66.0 in | Wt 190.4 lb

## 2018-10-11 DIAGNOSIS — I2510 Atherosclerotic heart disease of native coronary artery without angina pectoris: Secondary | ICD-10-CM | POA: Diagnosis not present

## 2018-10-11 DIAGNOSIS — R0789 Other chest pain: Secondary | ICD-10-CM | POA: Diagnosis not present

## 2018-10-11 DIAGNOSIS — R6884 Jaw pain: Secondary | ICD-10-CM

## 2018-10-11 DIAGNOSIS — I255 Ischemic cardiomyopathy: Secondary | ICD-10-CM | POA: Diagnosis not present

## 2018-10-11 DIAGNOSIS — I10 Essential (primary) hypertension: Secondary | ICD-10-CM

## 2018-10-11 DIAGNOSIS — E785 Hyperlipidemia, unspecified: Secondary | ICD-10-CM

## 2018-10-11 LAB — BASIC METABOLIC PANEL
BUN/Creatinine Ratio: 27 (ref 12–28)
BUN: 24 mg/dL (ref 8–27)
CO2: 22 mmol/L (ref 20–29)
Calcium: 9 mg/dL (ref 8.7–10.3)
Chloride: 104 mmol/L (ref 96–106)
Creatinine, Ser: 0.89 mg/dL (ref 0.57–1.00)
GFR calc Af Amer: 75 mL/min/{1.73_m2} (ref >59)
GFR calc non Af Amer: 65 mL/min/{1.73_m2} (ref >59)
GLUCOSE: 91 mg/dL (ref 65–99)
Potassium: 5.2 mmol/L (ref 3.5–5.2)
Sodium: 142 mmol/L (ref 134–144)

## 2018-10-11 LAB — CBC
Hematocrit: 40.9 % (ref 34.0–46.6)
Hemoglobin: 13.6 g/dL (ref 11.1–15.9)
MCH: 31.3 pg (ref 26.6–33.0)
MCHC: 33.3 g/dL (ref 31.5–35.7)
MCV: 94 fL (ref 79–97)
Platelets: 275 10*3/uL (ref 150–450)
RBC: 4.34 x10E6/uL (ref 3.77–5.28)
RDW: 12.9 % (ref 11.7–15.4)
WBC: 15.6 10*3/uL — AB (ref 3.4–10.8)

## 2018-10-11 LAB — TSH: TSH: 1.63 u[IU]/mL (ref 0.450–4.500)

## 2018-10-11 MED ORDER — FELODIPINE ER 5 MG PO TB24: 5.0000 mg | ORAL_TABLET | Freq: Every day | ORAL | 1 refills | Status: DC

## 2018-10-11 NOTE — Patient Instructions (Addendum)
Medication Instructions:  Your physician has recommended you make the following change in your medication:  1.  START Felodine 5 mg take 1 tablet daily   If you need a refill on your cardiac medications before your next appointment, please call your pharmacy.   Lab work: TODAY:  BMET, CBC, & TSH   If you have labs (blood work) drawn today and your tests are completely normal, you will receive your results only by: Marland Kitchen MyChart Message (if you have MyChart) OR . A paper copy in the mail If you have any lab test that is abnormal or we need to change your treatment, we will call you to review the results.  Testing/Procedures: None ordered  Follow-Up: At [facility], you and your health needs are our priority.  As part of our continuing mission to provide you with exceptional heart care, we have created designated Provider Care Teams.  These Care Teams include your primary Cardiologist (physician) and Advanced Practice Providers (APPs -  Physician Assistants and Nurse Practitioners) who all work together to provide you with the care you need, when you need it. You will need a follow up appointment in 10/25/2018 ARRIVE AT 11:15 TO SEE DAYNA DUNN, PA-C  Any Other Special Instructions Will Be Listed Below (If Applicable).

## 2018-10-25 ENCOUNTER — Ambulatory Visit: Payer: Medicare Other | Admitting: Physician Assistant

## 2018-11-05 ENCOUNTER — Telehealth: Payer: Self-pay | Admitting: Internal Medicine

## 2018-11-05 ENCOUNTER — Ambulatory Visit (INDEPENDENT_AMBULATORY_CARE_PROVIDER_SITE_OTHER): Payer: Medicare Other | Admitting: Internal Medicine

## 2018-11-05 ENCOUNTER — Encounter: Payer: Self-pay | Admitting: Internal Medicine

## 2018-11-05 ENCOUNTER — Other Ambulatory Visit: Payer: Self-pay

## 2018-11-05 DIAGNOSIS — Z20828 Contact with and (suspected) exposure to other viral communicable diseases: Secondary | ICD-10-CM | POA: Diagnosis not present

## 2018-11-05 DIAGNOSIS — R05 Cough: Secondary | ICD-10-CM | POA: Diagnosis not present

## 2018-11-05 DIAGNOSIS — R0602 Shortness of breath: Secondary | ICD-10-CM

## 2018-11-05 DIAGNOSIS — M7918 Myalgia, other site: Secondary | ICD-10-CM

## 2018-11-05 DIAGNOSIS — Z20822 Contact with and (suspected) exposure to covid-19: Secondary | ICD-10-CM | POA: Insufficient documentation

## 2018-11-05 MED ORDER — AMOXICILLIN-POT CLAVULANATE 500-125 MG PO TABS: ORAL_TABLET | ORAL | 0 refills | Status: DC

## 2018-11-05 MED ORDER — GUAIFENESIN-CODEINE 100-10 MG/5ML PO SOLN: 5.0000 mL | Freq: Three times a day (TID) | ORAL | 0 refills | Status: DC | PRN

## 2018-11-05 NOTE — Progress Notes (Signed)
Virtual Visit via Telephone Note  I connected with Donna Bailey on 11/05/18 at 11:30 AM EDT by telephone and verified that I am speaking with the correct person using two identifiers.   I discussed the limitations, risks, security and privacy concerns of performing an evaluation and management service by telephone and the availability of in person appointments. I also discussed with the patient that there may be a patient responsible charge related to this service. The patient expressed understanding and agreed to proceed.   History of Present Illness: 39 yoF known to me with hx of allergic rhinitis, chronic bronchitis, CAD/ MI and intolerance to corn products.  Acute illness- 1 week- cough with scant, clear mucus, body aches, some increased SOB.  About 3 weeks ago she sat and walked in church with a woman who has since been hosp with confirmed Covid  She can't see well enough to read her mercury thermometer. Possible 1 degree temp elevation. No chills or GI upset. She asks to have on hand an antibiotic and cough syrup she took last fall. Observations/Objective:  Vital Sxs not available. I note that her manner in conversation was calm and unhurried with unlabored breathing and no coughing heard.  Assessment and Plan: Her story is consistent with mild Covid-19 syndrome, but non-specific.  Advise- supportive care, self-quarantine x 2 weeks, go to ER if progressive. I agreed to let her have augmentin and Robitussin AC cough syrup on hand.   Follow Up Instructions:   I discussed the assessment and treatment plan with the patient. The patient was provided an opportunity to ask questions and all were answered. The patient agreed with the plan and demonstrated an understanding of the instructions.   The patient was advised to call back or seek an in-person evaluation if the symptoms worsen or if the condition fails to improve as anticipated.  I provided 30 minutes of non-face-to-face time  during this encounter.   Baird Lyons, MD

## 2018-11-05 NOTE — Assessment & Plan Note (Signed)
Her story is consistent with mild Covid-19 syndrome, but non-specific.  Advise- supportive care, self-quarantine x 2 weeks, go to ER if progressive. I agreed to let her have augmentin and Robitussin AC cough syrup on hand.

## 2018-11-05 NOTE — Patient Instructions (Signed)
Scripts sent for augmentin antibiotic and Robitussin AC cough syrup  We manage Covid-19 by quarantining at home for 2 weeks.  Stay well hydrated. If you get much sicker- more short of breath, worse cough, fatigue, aching, abdominal pain- then go to ER and tel them that you have had exposure to someone who was hospitalized with definite Covid-19.  We will make a follow-up visit in 4 months. Please call earlier as needed

## 2018-11-05 NOTE — Telephone Encounter (Signed)
Tele note done

## 2018-11-08 ENCOUNTER — Telehealth: Payer: Self-pay | Admitting: Internal Medicine

## 2018-11-08 NOTE — Telephone Encounter (Signed)
Called and spoke with patient regarding concerns about covid19 Donna Bailey over CY recommendations from televisit on 11/05/18 She stated she was outside this week, her right eye was little red Needing to know if this was a covid symptom or allergies. Advised the pollen count was elevated this weekend, could be allergies She is taking robitussin syrup and augmentin and feeling better No fever, no body aches, and no sob or wheezing Pt verbalized she is feeling well, and had no further concerns Nothing further needed at this time.

## 2018-11-10 ENCOUNTER — Ambulatory Visit: Payer: Medicare Other | Admitting: Pulmonary Disease

## 2018-11-10 ENCOUNTER — Other Ambulatory Visit: Payer: Self-pay | Admitting: Physician Assistant

## 2018-11-10 ENCOUNTER — Ambulatory Visit: Payer: Medicare Other | Admitting: Internal Medicine

## 2018-11-11 ENCOUNTER — Telehealth: Payer: Self-pay | Admitting: Internal Medicine

## 2018-11-11 MED ORDER — AZITHROMYCIN 250 MG PO TABS: ORAL_TABLET | ORAL | 0 refills | Status: DC

## 2018-11-11 NOTE — Telephone Encounter (Signed)
This may be pollen allergy, best treated with daily antihistamine like chlortrimeton, which may be more effective than claritin. If it is pollen, no antibiotic will help.  Ok to let her try script for Zpak  250 mg, # 6, 2 today then one daily

## 2018-11-11 NOTE — Telephone Encounter (Signed)
Called and spoke with pt. Pt is currently on augmetnin which was prescribed by Dr. Susa Day and she stated she has been on med since last Friday, 4/3. Pt stated she began coughing up yellow phlegm today, 4/9. Pt stated she will be finishing up the augmentin tonight and due to now coughing up yellow phlegm, pt is wanting to know if something else could be prescribed.  Pt stated she does have some tightness in chest, denies any wheezing, but stated she will become winded easier. Pt denies any fever.  Dr. [name], please advise on this for pt. Thanks!  Allergies  Allergen Reactions  . Sulfa Antibiotics Anaphylaxis  . Sulfonamide Derivatives Anaphylaxis  . Penicillins Rash    Underarms (both) Has patient had a PCN reaction causing immediate rash, facial/tongue/throat swelling, SOB or lightheadedness with hypotension: YES Has patient had a PCN reaction causing severe rash involving mucus membranes or skin necrosis: NO Has patient had a PCN reaction that required hospitalization NO Has patient had a PCN reaction occurring within the last 10 years: NO If all of the above answers are "NO", then may proceed with Cephalosporin use.  Delma Freeze [Fexofenadine]     Heart race  . Avelox [Moxifloxacin]     Doesn't remember  . Caffeine     Heart race  . Cephalexin Other (See Comments)    unknown  . Ciprofloxacin Other (See Comments)    unknown  . Corn-Containing Products Itching and Other (See Comments)    Can not walk if have a lot of it.  . Crestor [Rosuvastatin] Other (See Comments)    Lost all muscle mobility   . Doxycycline Diarrhea and Nausea And Vomiting  . Formoterol Fumarate Other (See Comments)    unknown  . Gold-Containing Drug Products     Skin tingle   . Latex     Gets red   . Levofloxacin     REACTION: HEART RACING  . Macrodantin [Nitrofurantoin Macrocrystal] Itching  . Neomycin Sulfate [Neomycin]     Doesn't remember - allergist said not to use it because it could add more  allergies   . Ofloxacin Itching  . Other     Corn fillers, corn by-products - causes severe itching Polyester-"pins sticking in her skin" Gold  . Adhesive [Tape] Rash  . Ibuprofen Rash  . Mold Extract [Trichophyton Mentagrophyte] Other (See Comments)    Bumps on back, stops up sinuses.  . Nickel Rash  . Tylenol [Acetaminophen] Rash    On face     Current Outpatient Medications:  .  amoxicillin-clavulanate (AUGMENTIN) 500-125 MG tablet, 1 twice daily, Disp: 14 tablet, Rfl: 0 .  aspirin 81 MG chewable tablet, Chew 1 tablet (81 mg total) by mouth daily., Disp: 30 tablet, Rfl: 11 .  atorvastatin (LIPITOR) 10 MG tablet, Take 1 tablet (10 mg total) by mouth daily., Disp: 90 tablet, Rfl: 1 .  carvedilol (COREG) 6.25 MG tablet, Take 1 tablet (6.25 mg total) by mouth 2 (two) times daily with a meal., Disp: 180 tablet, Rfl: 2 .  Cyanocobalamin (VITAMIN B-12 IJ), Inject 1,000 mg as directed every 30 (thirty) days., Disp: , Rfl:  .  diphenhydrAMINE (BENADRYL) 25 mg capsule, Take 25 mg by mouth every 6 (six) hours as needed for itching or allergies. , Disp: , Rfl:  .  esomeprazole (NEXIUM) 20 MG capsule, Take 20 mg by mouth daily at 12 noon. , Disp: , Rfl:  .  felodipine (PLENDIL) 5 MG 24 hr tablet, TAKE 1 TABLET  BY MOUTH EVERY DAY, Disp: 30 tablet, Rfl: 1 .  guaiFENesin-codeine (ROBITUSSIN AC) 100-10 MG/5ML syrup, codeine 10 mg-guaifenesin 100 mg/5 mL oral liquid  TAKE 1 TEASPOONFUL BY MOUTH EVERY 4 HOURS AS NEEDED, Disp: , Rfl:  .  guaiFENesin-codeine 100-10 MG/5ML syrup, Take 5 mLs by mouth 3 (three) times daily as needed for cough., Disp: 120 mL, Rfl: 0 .  nitroGLYCERIN (NITROSTAT) 0.4 MG SL tablet, Place 1 tablet (0.4 mg total) under the tongue every 5 (five) minutes as needed for chest pain., Disp: 25 tablet, Rfl: 2 .  ondansetron (ZOFRAN) 4 MG tablet, Take 1 tablet (4 mg total) by mouth every 8 (eight) hours as needed for nausea or vomiting., Disp: 4 tablet, Rfl: 0 .  promethazine-codeine  (PHENERGAN WITH CODEINE) 6.25-10 MG/5ML syrup, Take 5 mLs by mouth every 6 (six) hours as needed for cough., Disp: 180 mL, Rfl: 0 .  ranolazine (RANEXA) 500 MG 12 hr tablet, Take 1 tablet (500 mg total) by mouth 2 (two) times daily., Disp: 180 tablet, Rfl: 3 .  ticagrelor (BRILINTA) 90 MG TABS tablet, Take 1 tablet (90 mg total) by mouth 2 (two) times daily., Disp: 60 tablet, Rfl: 11 .  valsartan (DIOVAN) 40 MG tablet, Take 0.5 tablets (20 mg total) by mouth daily., Disp: 30 tablet, Rfl: 0 .  zolpidem (AMBIEN) 10 MG tablet, Take 1 tablet (10 mg total) by mouth at bedtime., Disp: 30 tablet, Rfl: 5

## 2018-11-11 NOTE — Telephone Encounter (Signed)
Called and spoke with pt stating to her the recs per CY. Pt expressed understanding and stated she would like to have zpak sent to pharmacy for her. Verified pt's preferred pharmacy and sent Rx in. Nothing further needed.

## 2018-11-16 ENCOUNTER — Telehealth: Payer: Self-pay | Admitting: Physician Assistant

## 2018-11-16 MED ORDER — FELODIPINE ER 10 MG PO TB24: 10.0000 mg | ORAL_TABLET | Freq: Every day | ORAL | 3 refills | Status: DC

## 2018-11-16 NOTE — Telephone Encounter (Signed)
Pt c/o medication issue:  1. Name of Medication:  valsartan (DIOVAN) 40 MG tablet Take 0.5 tablets (20 mg total) by mouth daily.     2. How are you currently taking this medication (dosage and times per day)?  1/2 a tablet a day   3. Are you having a reaction (difficulty breathing--STAT)? no  4. What is your medication issue? Getting pain in her head, and pain in her shoulder and side of her neck  Having trouble getting up feels like she is weighted down,since starting medication

## 2018-11-16 NOTE — Telephone Encounter (Addendum)
    Chart Scrub Note  Donna Bailey is currently scheduled for in-person visit on 11/25/18 (2 week BP follow-up). Due to the recent COVID-19 pandemic, we are transitioning in-person office visits to tele-medicine visits in an effort to decrease non-essential exposure to our patients, their families, and staff. This patient has been deemed a candidate to change their previously scheduled office visit to virtual visit.   Please call patient:  1. Inform them we will be conducting visit via tele-health, instead of coming to the office physically in person. 2. Please discuss doing telephone versus video visit. If the patient has a smartphone, video visit is preferred method. Note:  I will be using Doximity video dialer instead of Webex. There is no prep needed on the patient's side. This merely involves the patient receiving a text at the time of their visit to launch the video (on a smartphone). The workflow would be for my assist to call the patient on the phone 15 mins prior to visit to get vitals and meds, then I will send the patient a text via mobile phone to launch the video. If she decides to proceed with video visit, please let patient know the plan. 3. When contact is confirmed, please change appointment type to Virtual Office Visit and indicate appointment type in notes. 4. Can review consent at time of this phone call when contact is established or 2-3 days prior to visit (dotphrase is hcevisitprecall).  If the patient has any urgent concerns, please send me back any pertinent information and we will decide whether we need to transition them to the doctor-of-the-day's schedule in person.  Thank you!   Renner Sebald PA-C

## 2018-11-16 NOTE — Telephone Encounter (Signed)
Could try increasing her felodipine from 5mg  to 10mg  daily since she has been tolerating that medication well for BP. Ok to stop valsartan and have pt monitor BP at home so that she can let Dayna know how her BP is doing on the higher dose of felodipine next week during her virtual visit.

## 2018-11-16 NOTE — Telephone Encounter (Signed)
Spoke back with the pt nad endorsed to her that per Dr. Meda Coffee and our Pharmacist, they both recommend that we increase her Felodipine from 5 mg to 10 mg po daily, discontinue her Valsartan, and she should monitor her BP daily and write this down, to report Donna Dunn PA-C on 4/23, how she's doing with the increased dose of Felodipine.  Confirmed the pharmacy of choice with the pt. Pt verbalized understanding and agrees with this plan.

## 2018-11-16 NOTE — Telephone Encounter (Signed)
Pt is calling in to let Dr Meda Coffee know that ever since she started taking valsartan, she has had side effects as listed:  Heavy feeling to both shoulders, feeling like she has a ton of weights pushing down on her shoulders, neck pain, feeling sluggish.  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  Pt is scheduled for a virtual visit with Melina Copa PA-C next week, but wanted to get her med issue addressed prior to that. Pt states her BP has been great since being on Valsartan, but she can absolutely NOT tolerate the way it makes her feel.  Pt would like for Dr Meda Coffee and our Pharmacist to review and further advise on this issue.  Pt inquiring she would like to be on a different regimen, that's safe with her allergy history. Informed the pt that I will route this communication to Dr Meda Coffee and our Pharmacist for further review and recommendation, then follow-up with the pt thereafter.  Pt verbalized understanding and agrees with this plan.

## 2018-11-16 NOTE — Telephone Encounter (Signed)
Patient set up for MyChart?  Decline   Is patient using Smartphone/computer/tablet? none  Did audio/video work?n/a  Does patient need telephone visit telephone visit only , patient can not do video   Best phone number to use? [phone]  Special Instructions? The phone she has can only do phone calls , no text messages or videos    TELEPHONE CALL NOTE  This patient has been deemed a candidate for follow-up tele-health visit to limit community exposure during the Covid-19 pandemic. I spoke with the patient via phone to discuss instructions. This has been outlined on the patient's AVS (dotphrase: hcevisitinfo). The patient was advised to review the section on consent for treatment as well. The patient will receive a phone call 2-3 days prior to their E-Visit at which time consent will be verbally confirmed. A Virtual Office Visit appointment type has been scheduled for 11/25/18 with Melina Copa, with "VIDEO" or "TELEPHONE" in the appointment notes - patient prefers Telephone type.  I have either confirmed the patient is active in MyChart or offered to send sign-up link to phone/email via Mychart icon beside patient's photo. Patient declined   Howie Ill 11/16/2018 1:35 PM  Patient gave consent for telphone visit 11/16/2018

## 2018-11-16 NOTE — Telephone Encounter (Signed)
Thank you Megan.

## 2018-11-16 NOTE — Telephone Encounter (Signed)
Virtual Visit Pre-Appointment Phone Call  Steps For Call:  1. Confirm consent - "In the setting of the current Covid19 crisis, you are scheduled for a (phone or video) visit with your provider on (date) at (time).  Just as we do with many in-office visits, in order for you to participate in this visit, we must obtain consent.  If you'd like, I can send this to your mychart (if signed up) or email for you to review.  Otherwise, I can obtain your verbal consent now.  All virtual visits are billed to your insurance company just like a normal visit would be.  By agreeing to a virtual visit, we'd like you to understand that the technology does not allow for your provider to perform an examination, and thus may limit your provider's ability to fully assess your condition.  Finally, though the technology is pretty good, we cannot assure that it will always work on either your or our end, and in the setting of a video visit, we may have to convert it to a phone-only visit.  In either situation, we cannot ensure that we have a secure connection.  Are you willing to proceed?" STAFF: Did the patient verbally acknowledge consent to telehealth visit? yesxDocument YES/NOyes  2. Confirm the BEST phone number to call the day of the visit: phone on file in notes  3. Give patient instructions for WebEx/MyChart download to smartphone as below or Doximity/Doxy.me if video visit (depending on what platform provider is using)  4. Advise patient to be prepared with any vital sign or heart rhythm information, their current medicines, and a piece of paper and pen handy for any instructions they may receive the day of their visit  5. Inform patient they will receive a phone call 15 minutes prior to their appointment time (may be from unknown caller ID) so they should be prepared to answer  6. Confirm that appointment type is correct in Epic appointment notes (video vs telephone)     TELEPHONE CALL NOTE  ZAMANI CROCKER has been deemed a candidate for a follow-up tele-health visit to limit community exposure during the Covid-19 pandemic. I spoke with the patient via phone to ensure availability of phone/video source, confirm preferred email & phone number, and discuss instructions and expectations.  I reminded KALASIA CRAFTON to be prepared with any vital sign and/or heart rhythm information that could potentially be obtained via home monitoring, at the time of her visit. I reminded SUMAYA RIEDESEL to expect a phone call at the time of her visit if her visit.  Jeanann Lewandowsky, [place] 11/16/2018 1:41 PM   DOWNLOADING THE WEBEX APP TO SMARTPHONE  - If Apple, ask patient to go to App Store and type in WebEx in the search bar. [employer], the blue/green circle. If Android, go to [employer] and type in [employer] in the search bar. The app is free but as with any other app downloads, their phone may require them to verify saved payment information or Apple/Android password.  - The patient does NOT have to create an account. - On the day of the visit, the assist will walk the patient through joining the meeting with the meeting number/password.  DOWNLOADING THE MYCHART APP TO SMARTPHONE  - If Apple, go to [employer] and type in MyChart in the search bar and download the app. If Android, ask patient to go to [employer] and type in [place] in the  search bar and download the app. The app is free but as with any other app downloads, their phone may require them to verify saved payment information or Apple/Android password.  - The patient will need to then log into the app with their MyChart username and password, and select [facility] as their healthcare provider to link the account. When it is time for your visit, go to the MyChart app, find appointments, and click Begin Video Visit. Be sure to Select Allow for your device to access the Microphone and Camera for your visit. You will then  be connected, and your provider will be with you shortly.  **If they have any issues connecting, or need assistance please contact [facility] [phone])**  **If using a computer, in order to ensure the best quality for your visit they will need to use either of the following Internet Browsers: Microsoft [place], or Google Chrome**  [place]   I hereby voluntarily request, consent and authorize [facility] and its employed or contracted physicians, physician assistants, nurse practitioners or other licensed health care professionals (the Practitioner), to provide me with telemedicine health care services (the Services") as deemed necessary by the treating Practitioner. I acknowledge and consent to receive the Services by the Practitioner via telemedicine. I understand that the telemedicine visit will involve communicating with the Practitioner through live audiovisual communication technology and the disclosure of certain medical information by electronic transmission. I acknowledge that I have been given the opportunity to request an in-person assessment or other available alternative prior to the telemedicine visit and am voluntarily participating in the telemedicine visit.  I understand that I have the right to withhold or withdraw my consent to the use of telemedicine in the course of my care at any time, without affecting my right to future care or treatment, and that the Practitioner or I may terminate the telemedicine visit at any time. I understand that I have the right to inspect all information obtained and/or recorded in the course of the telemedicine visit and may receive copies of available information for a reasonable fee.  I understand that some of the potential risks of receiving the Services via telemedicine include:   Delay or interruption in medical evaluation due to technological equipment failure or disruption;  Information  transmitted may not be sufficient (e.g. poor resolution of images) to allow for appropriate medical decision making by the Practitioner; and/or   In rare instances, security protocols could fail, causing a breach of personal health information.  Furthermore, I acknowledge that it is my responsibility to provide information about my medical history, conditions and care that is complete and accurate to the best of my ability. I acknowledge that Practitioner's advice, recommendations, and/or decision may be based on factors not within their control, such as incomplete or inaccurate data provided by me or distortions of diagnostic images or specimens that may result from electronic transmissions. I understand that the practice of medicine is not an exact science and that Practitioner makes no warranties or guarantees regarding treatment outcomes. I acknowledge that I will receive a copy of this consent concurrently upon execution via email to the email address I last provided but may also request a printed copy by calling the office of [facility].    I understand that my insurance will be billed for this visit.   I have read or had this consent read to me.  I understand the contents of this consent, which  adequately explains the benefits and risks of the Services being provided via telemedicine.   I have been provided ample opportunity to ask questions regarding this consent and the Services and have had my questions answered to my satisfaction.  I give my informed consent for the services to be provided through the use of telemedicine in my medical care  By participating in this telemedicine visit I agree to the above.
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
## 2018-11-25 ENCOUNTER — Encounter (HOSPITAL_COMMUNITY): Payer: Self-pay | Admitting: Emergency Medicine

## 2018-11-25 ENCOUNTER — Emergency Department (HOSPITAL_COMMUNITY): Payer: Medicare Other

## 2018-11-25 ENCOUNTER — Other Ambulatory Visit: Payer: Self-pay

## 2018-11-25 ENCOUNTER — Encounter: Payer: Self-pay | Admitting: Physician Assistant

## 2018-11-25 ENCOUNTER — Telehealth (INDEPENDENT_AMBULATORY_CARE_PROVIDER_SITE_OTHER): Payer: Medicare Other | Admitting: Physician Assistant

## 2018-11-25 ENCOUNTER — Observation Stay (HOSPITAL_COMMUNITY)
Admission: EM | Admit: 2018-11-25 | Discharge: 2018-11-27 | Disposition: A | Discharge status: Home / Self Care | Payer: Medicare Other | Attending: Cardiology | Admitting: Cardiology

## 2018-11-25 ENCOUNTER — Other Ambulatory Visit: Payer: Self-pay | Admitting: Cardiology

## 2018-11-25 VITALS — BP 126/71 | HR 70 | Ht 66.0 in | Wt 180.0 lb

## 2018-11-25 DIAGNOSIS — I10 Essential (primary) hypertension: Secondary | ICD-10-CM

## 2018-11-25 DIAGNOSIS — D649 Anemia, unspecified: Secondary | ICD-10-CM | POA: Diagnosis not present

## 2018-11-25 DIAGNOSIS — I2510 Atherosclerotic heart disease of native coronary artery without angina pectoris: Secondary | ICD-10-CM

## 2018-11-25 DIAGNOSIS — I249 Acute ischemic heart disease, unspecified: Secondary | ICD-10-CM | POA: Diagnosis present

## 2018-11-25 DIAGNOSIS — I129 Hypertensive chronic kidney disease with stage 1 through stage 4 chronic kidney disease, or unspecified chronic kidney disease: Secondary | ICD-10-CM | POA: Insufficient documentation

## 2018-11-25 DIAGNOSIS — K449 Diaphragmatic hernia without obstruction or gangrene: Secondary | ICD-10-CM | POA: Diagnosis not present

## 2018-11-25 DIAGNOSIS — Z7902 Long term (current) use of antithrombotics/antiplatelets: Secondary | ICD-10-CM | POA: Insufficient documentation

## 2018-11-25 DIAGNOSIS — K2270 Barrett's esophagus without dysplasia: Secondary | ICD-10-CM | POA: Diagnosis not present

## 2018-11-25 DIAGNOSIS — N182 Chronic kidney disease, stage 2 (mild): Secondary | ICD-10-CM | POA: Diagnosis not present

## 2018-11-25 DIAGNOSIS — G629 Polyneuropathy, unspecified: Secondary | ICD-10-CM | POA: Diagnosis not present

## 2018-11-25 DIAGNOSIS — I252 Old myocardial infarction: Secondary | ICD-10-CM | POA: Diagnosis not present

## 2018-11-25 DIAGNOSIS — M25511 Pain in right shoulder: Secondary | ICD-10-CM | POA: Diagnosis present

## 2018-11-25 DIAGNOSIS — E785 Hyperlipidemia, unspecified: Secondary | ICD-10-CM | POA: Insufficient documentation

## 2018-11-25 DIAGNOSIS — J45909 Unspecified asthma, uncomplicated: Secondary | ICD-10-CM | POA: Diagnosis not present

## 2018-11-25 DIAGNOSIS — Z886 Allergy status to analgesic agent status: Secondary | ICD-10-CM | POA: Diagnosis not present

## 2018-11-25 DIAGNOSIS — I25110 Atherosclerotic heart disease of native coronary artery with unstable angina pectoris: Principal | ICD-10-CM | POA: Insufficient documentation

## 2018-11-25 DIAGNOSIS — Z880 Allergy status to penicillin: Secondary | ICD-10-CM | POA: Insufficient documentation

## 2018-11-25 DIAGNOSIS — Z888 Allergy status to other drugs, medicaments and biological substances status: Secondary | ICD-10-CM | POA: Insufficient documentation

## 2018-11-25 DIAGNOSIS — R0789 Other chest pain: Secondary | ICD-10-CM

## 2018-11-25 DIAGNOSIS — M797 Fibromyalgia: Secondary | ICD-10-CM | POA: Diagnosis not present

## 2018-11-25 DIAGNOSIS — Z7982 Long term (current) use of aspirin: Secondary | ICD-10-CM | POA: Diagnosis not present

## 2018-11-25 DIAGNOSIS — K219 Gastro-esophageal reflux disease without esophagitis: Secondary | ICD-10-CM | POA: Diagnosis not present

## 2018-11-25 DIAGNOSIS — M25512 Pain in left shoulder: Secondary | ICD-10-CM | POA: Insufficient documentation

## 2018-11-25 DIAGNOSIS — F419 Anxiety disorder, unspecified: Secondary | ICD-10-CM | POA: Insufficient documentation

## 2018-11-25 DIAGNOSIS — Z955 Presence of coronary angioplasty implant and graft: Secondary | ICD-10-CM | POA: Insufficient documentation

## 2018-11-25 DIAGNOSIS — E7800 Pure hypercholesterolemia, unspecified: Secondary | ICD-10-CM | POA: Insufficient documentation

## 2018-11-25 DIAGNOSIS — Z881 Allergy status to other antibiotic agents status: Secondary | ICD-10-CM | POA: Insufficient documentation

## 2018-11-25 DIAGNOSIS — I255 Ischemic cardiomyopathy: Secondary | ICD-10-CM | POA: Diagnosis not present

## 2018-11-25 DIAGNOSIS — Z79899 Other long term (current) drug therapy: Secondary | ICD-10-CM | POA: Diagnosis not present

## 2018-11-25 DIAGNOSIS — Z20828 Contact with and (suspected) exposure to other viral communicable diseases: Secondary | ICD-10-CM

## 2018-11-25 DIAGNOSIS — Z882 Allergy status to sulfonamides status: Secondary | ICD-10-CM | POA: Insufficient documentation

## 2018-11-25 DIAGNOSIS — I200 Unstable angina: Secondary | ICD-10-CM | POA: Diagnosis not present

## 2018-11-25 DIAGNOSIS — Z20822 Contact with and (suspected) exposure to covid-19: Secondary | ICD-10-CM

## 2018-11-25 HISTORY — DX: Essential (primary) hypertension: I10

## 2018-11-25 LAB — COMPREHENSIVE METABOLIC PANEL
ALT: 27 U/L (ref 0–44)
AST: 29 U/L (ref 15–41)
Albumin: 3.8 g/dL (ref 3.5–5.0)
Alkaline Phosphatase: 79 U/L (ref 38–126)
Anion gap: 12 (ref 5–15)
BUN: 15 mg/dL (ref 8–23)
CO2: 22 mmol/L (ref 22–32)
Calcium: 9.3 mg/dL (ref 8.9–10.3)
Chloride: 107 mmol/L (ref 98–111)
Creatinine, Ser: 1.09 mg/dL — ABNORMAL HIGH (ref 0.44–1.00)
GFR calc Af Amer: 59 mL/min — ABNORMAL LOW (ref >60)
GFR calc non Af Amer: 51 mL/min — ABNORMAL LOW (ref >60)
Glucose, Bld: 93 mg/dL (ref 70–99)
Potassium: 4.3 mmol/L (ref 3.5–5.1)
Sodium: 141 mmol/L (ref 135–145)
Total Bilirubin: 0.6 mg/dL (ref 0.3–1.2)
Total Protein: 7.1 g/dL (ref 6.5–8.1)

## 2018-11-25 LAB — CBC WITH DIFFERENTIAL/PLATELET
Abs Immature Granulocytes: 0.06 10*3/uL (ref 0.00–0.07)
Basophils Absolute: 0.1 10*3/uL (ref 0.0–0.1)
Basophils Relative: 1 %
Eosinophils Absolute: 0.4 10*3/uL (ref 0.0–0.5)
Eosinophils Relative: 5 %
HCT: 45.1 % (ref 36.0–46.0)
Hemoglobin: 14.7 g/dL (ref 12.0–15.0)
Immature Granulocytes: 1 %
Lymphocytes Relative: 27 %
Lymphs Abs: 2 10*3/uL (ref 0.7–4.0)
MCH: 30.7 pg (ref 26.0–34.0)
MCHC: 32.6 g/dL (ref 30.0–36.0)
MCV: 94.2 fL (ref 80.0–100.0)
Monocytes Absolute: 0.8 10*3/uL (ref 0.1–1.0)
Monocytes Relative: 11 %
Neutro Abs: 4.1 10*3/uL (ref 1.7–7.7)
Neutrophils Relative %: 55 %
Platelets: 320 10*3/uL (ref 150–400)
RBC: 4.79 MIL/uL (ref 3.87–5.11)
RDW: 14.6 % (ref 11.5–15.5)
WBC: 7.4 10*3/uL (ref 4.0–10.5)
nRBC: 0 % (ref 0.0–0.2)

## 2018-11-25 LAB — TROPONIN I
Troponin I: <0.03 ng/mL (ref <0.03)
Troponin I: <0.03 ng/mL (ref <0.03)

## 2018-11-25 MED ORDER — SODIUM CHLORIDE 0.9 % WEIGHT BASED INFUSION
1.0000 mL/kg/h | INTRAVENOUS | Status: DC
  Administered 2018-11-26: 06:00:00 1 mL/kg/h via INTRAVENOUS

## 2018-11-25 MED ORDER — SODIUM CHLORIDE 0.9% FLUSH
3.0000 mL | Freq: Two times a day (BID) | INTRAVENOUS | Status: DC
  Administered 2018-11-25: 3 mL via INTRAVENOUS

## 2018-11-25 MED ORDER — DIPHENHYDRAMINE HCL 25 MG PO CAPS: 25.0000 mg | ORAL_CAPSULE | Freq: Four times a day (QID) | ORAL | Status: DC | PRN

## 2018-11-25 MED ORDER — ZOLPIDEM TARTRATE 5 MG PO TABS
5.0000 mg | ORAL_TABLET | Freq: Every day | ORAL | Status: DC
  Administered 2018-11-25: 5 mg via ORAL
  Filled 2018-11-25: qty 1

## 2018-11-25 MED ORDER — ATORVASTATIN CALCIUM 10 MG PO TABS: 10.0000 mg | ORAL_TABLET | Freq: Every day | ORAL | Status: DC

## 2018-11-25 MED ORDER — RANOLAZINE ER 500 MG PO TB12
500.0000 mg | ORAL_TABLET | Freq: Two times a day (BID) | ORAL | Status: DC
  Administered 2018-11-25: 500 mg via ORAL
  Filled 2018-11-25: qty 1

## 2018-11-25 MED ORDER — HEPARIN BOLUS VIA INFUSION
4000.0000 [IU] | Freq: Once | INTRAVENOUS | Status: AC
  Administered 2018-11-25: 4000 [IU] via INTRAVENOUS
  Filled 2018-11-25: qty 4000

## 2018-11-25 MED ORDER — SODIUM CHLORIDE 0.9 % IV SOLN: 250.0000 mL | INTRAVENOUS | Status: DC | PRN

## 2018-11-25 MED ORDER — TICAGRELOR 90 MG PO TABS
90.0000 mg | ORAL_TABLET | Freq: Two times a day (BID) | ORAL | Status: DC
  Administered 2018-11-25: 23:00:00 90 mg via ORAL
  Filled 2018-11-25: qty 1

## 2018-11-25 MED ORDER — NITROGLYCERIN 0.4 MG SL SUBL
0.4000 mg | SUBLINGUAL_TABLET | SUBLINGUAL | Status: DC | PRN
  Administered 2018-11-26: 08:00:00 0.4 mg via SUBLINGUAL

## 2018-11-25 MED ORDER — FELODIPINE ER 10 MG PO TB24
10.0000 mg | ORAL_TABLET | Freq: Every day | ORAL | Status: DC
  Filled 2018-11-25 (×2): qty 1

## 2018-11-25 MED ORDER — ASPIRIN 81 MG PO CHEW
81.0000 mg | CHEWABLE_TABLET | Freq: Every day | ORAL | Status: DC
  Administered 2018-11-26: 09:00:00 81 mg via ORAL
  Filled 2018-11-25: qty 1

## 2018-11-25 MED ORDER — HYDROCORTISONE 1 % EX CREA
TOPICAL_CREAM | CUTANEOUS | Status: DC | PRN
  Administered 2018-11-25: 1 via TOPICAL
  Filled 2018-11-25: qty 28

## 2018-11-25 MED ORDER — CARVEDILOL 6.25 MG PO TABS
6.2500 mg | ORAL_TABLET | Freq: Two times a day (BID) | ORAL | Status: DC
  Administered 2018-11-26: 09:00:00 6.25 mg via ORAL
  Filled 2018-11-25: qty 1

## 2018-11-25 MED ORDER — ONDANSETRON HCL 4 MG/2ML IJ SOLN
4.0000 mg | Freq: Four times a day (QID) | INTRAMUSCULAR | Status: DC | PRN
  Administered 2018-11-26: 06:00:00 4 mg via INTRAVENOUS
  Filled 2018-11-25: qty 2

## 2018-11-25 MED ORDER — GUAIFENESIN-CODEINE 100-10 MG/5ML PO SOLN: 5.0000 mL | ORAL | Status: DC | PRN

## 2018-11-25 MED ORDER — SODIUM CHLORIDE 0.9% FLUSH: 3.0000 mL | INTRAVENOUS | Status: DC | PRN

## 2018-11-25 MED ORDER — ONDANSETRON HCL 4 MG PO TABS: 4.0000 mg | ORAL_TABLET | Freq: Three times a day (TID) | ORAL | Status: DC | PRN

## 2018-11-25 MED ORDER — ZOLPIDEM TARTRATE 5 MG PO TABS: 5.0000 mg | ORAL_TABLET | Freq: Every evening | ORAL | Status: DC | PRN

## 2018-11-25 MED ORDER — PANTOPRAZOLE SODIUM 40 MG PO TBEC: 80.0000 mg | DELAYED_RELEASE_TABLET | Freq: Every day | ORAL | Status: DC

## 2018-11-25 MED ORDER — ONDANSETRON 4 MG PO TBDP
4.0000 mg | ORAL_TABLET | Freq: Once | ORAL | Status: AC
  Administered 2018-11-25: 4 mg via ORAL

## 2018-11-25 MED ORDER — SODIUM CHLORIDE 0.9 % WEIGHT BASED INFUSION
3.0000 mL/kg/h | INTRAVENOUS | Status: DC
  Administered 2018-11-26: 05:00:00 3 mL/kg/h via INTRAVENOUS

## 2018-11-25 MED ORDER — HEPARIN (PORCINE) 25000 UT/250ML-% IV SOLN
850.0000 [IU]/h | INTRAVENOUS | Status: DC
  Administered 2018-11-25: 950 [IU]/h via INTRAVENOUS
  Filled 2018-11-25: qty 250

## 2018-11-25 MED ORDER — ASPIRIN 81 MG PO CHEW
324.0000 mg | CHEWABLE_TABLET | Freq: Once | ORAL | Status: DC
  Filled 2018-11-25: qty 4

## 2018-11-25 NOTE — ED Notes (Signed)
[place], Husband, please call with updates or questions

## 2018-11-25 NOTE — ED Notes (Signed)
Called to give report to receiving RN. Yet to read report states she will call back

## 2018-11-25 NOTE — ED Notes (Signed)
Walk patient to the bathroom patient did well

## 2018-11-25 NOTE — ED Notes (Signed)
ED Provider at bedside. 

## 2018-11-25 NOTE — ED Notes (Signed)
Patient is now back in bed with call bell in reach
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
## 2018-11-25 NOTE — ED Triage Notes (Signed)
Pt states she has had cp for 3 weeks. Pain goes into her back/neck and a headache for 3 weeks. Pt states she has a stent and heart attack last year. Pt states she is SOB. Denies swelling.

## 2018-11-25 NOTE — ED Provider Notes (Signed)
Physical Exam  BP (!) 121/57   Pulse 74   Temp 98.3 F (36.8 C) (Oral)   Resp 18   Ht 5\' 6"  (1.676 m)   Wt 81.6 kg   SpO2 95%   BMI 29.05 kg/m   Assumed care from Dr. Quintella Reichert at 1600. Briefly, the patient is a 73 y.o. female with PMHx of  has a past medical history of Allergic rhinitis, cause unspecified, Anemia, Aneurysm of right conjunctiva, Anxiety, Asthma, Barrett's esophagus, CAD in native artery, CKD (chronic kidney disease), stage II, Constipation, Cystocele, Deviated nasal septum, Diaphragmatic hernia without mention of obstruction or gangrene, Esophageal reflux, Fibromyalgia, H/O hiatal hernia, Hypertension, Insomnia, unspecified, Ischemic cardiomyopathy, Kidney stones, Migraine, Myalgia and myositis, unspecified, Nuclear stress tests, Osteoarthrosis, unspecified whether generalized or localized, unspecified site, Peripheral neuropathy, Pneumonia, Pure hypercholesterolemia, Rectocele, Scoliosis (and kyphoscoliosis), idiopathic, Statin intolerance, Temporomandibular joint disorders, unspecified, and Wheat allergy. here with a few weeks of bilateral shoulder pain rating from right to left feeling as a heaviness whenever she does activity.  Cardiology requested patient come to the emergency department as her symptoms were concerning for anginal equivalent.  Patient had DES to the LAD placed in April 2019 for STEMI.  She then had repeat coronary artery catheterization in June 2019 no further intervention, showed 60% ostial LCx stenosis.  She had a nonischemic nuclear stress test in February 2020.  Labs Reviewed  COMPREHENSIVE METABOLIC PANEL - Abnormal; Notable for the following components:      Result Value   Creatinine, Ser 1.09 (*)    GFR calc non Af Amer 51 (*)    GFR calc Af Amer 59 (*)    All other components within normal limits  CBC WITH DIFFERENTIAL/PLATELET  TROPONIN I  TROPONIN I    Course of Care:   Physical Exam Vitals signs and nursing note reviewed.   Constitutional:      General: She is not in acute distress.    Appearance: She is well-developed. She is not diaphoretic.     Comments: Lying comfortably in bed.  HENT:     Head: Normocephalic and atraumatic.  Eyes:     General:        Right eye: No discharge.        Left eye: No discharge.     Conjunctiva/sclera: Conjunctivae normal.     Comments: EOMs normal to gross examination.  Neck:     Musculoskeletal: Normal range of motion.  Cardiovascular:     Rate and Rhythm: Normal rate and regular rhythm.     Heart sounds: Normal heart sounds.  Pulmonary:     Effort: Pulmonary effort is normal.     Breath sounds: Normal breath sounds.     Comments: Patient converses comfortably.  No audible wheeze or stridor. Abdominal:     General: There is no distension.  Musculoskeletal: Normal range of motion.  Skin:    General: Skin is warm and dry.  Neurological:     Mental Status: She is alert.     Comments: Cranial nerves intact to gross observation. Patient moves extremities without difficulty.  Psychiatric:        Behavior: Behavior normal.        Thought Content: Thought content normal.        Judgment: Judgment normal.     ED Course/Procedures   Clinical Course as of Nov 25 2026  Thu Nov 25, 2018  1805 RN called provider to room to discuss hypotension. Resolved by the time provider  arrived. Pt reporting she is feeling the "heaviness" in shoulders and will obtain repeat EKG.    [AM]  1951 Spoke with Dr. Meda Coffee who will personally evaluate the patient to assist with disposition.  I appreciate her involvement.   [AM]    Clinical Course User Index [AM] Albesa Seen, PA-C    Procedures  MDM   Patient pending delta troponin at time of signout.  Initial troponin negative.  EKG appearing nonischemic, reviewed by me.  Patient initially resistant to any hospitalization, however does report that she would consider staying in the hospital cardiology recommends it.  Delta  troponin is negative.  Repeat EKG in the setting of patient's return of discomfort showed slight T wave flattening.   Cardiology consulted who evaluated the patient, and is admitting the patient.  I appreciate their involvement.      Tamala Julian 11/25/18 2038    Virgel Manifold, MD 11/26/18 2256
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
## 2018-11-25 NOTE — ED Notes (Signed)
Pt reports feeling nauseous and insists on taking home medication (Zofran ODT) due to allergies to fillers. Verified with ED Provider, Yetta Flock, [place].
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
## 2018-11-25 NOTE — Progress Notes (Signed)
ANTICOAGULATION CONSULT NOTE - Initial Consult  Pharmacy Consult for heparin Indication: chest pain/ACS  Allergies  Allergen Reactions  . Sulfa Antibiotics Anaphylaxis  . Sulfonamide Derivatives Anaphylaxis  . Penicillins Rash    Underarms (both) Has patient had a PCN reaction causing immediate rash, facial/tongue/throat swelling, SOB or lightheadedness with hypotension: YES Has patient had a PCN reaction causing severe rash involving mucus membranes or skin necrosis: NO Has patient had a PCN reaction that required hospitalization NO Has patient had a PCN reaction occurring within the last 10 years: NO If all of the above answers are "NO", then may proceed with Cephalosporin use.  Delma Freeze [Fexofenadine]     Heart race  . Avelox [Moxifloxacin]     Doesn't remember  . Caffeine     Heart race  . Cephalexin Other (See Comments)    unknown  . Ciprofloxacin Other (See Comments)    unknown  . Corn-Containing Products Itching and Other (See Comments)    Can not walk if have a lot of it.  . Crestor [Rosuvastatin] Other (See Comments)    Lost all muscle mobility   . Doxycycline Diarrhea and Nausea And Vomiting  . Formoterol Fumarate Other (See Comments)    unknown  . Gold-Containing Drug Products     Skin tingle   . Latex     Gets red   . Levofloxacin     REACTION: HEART RACING  . Macrodantin [Nitrofurantoin Macrocrystal] Itching  . Neomycin Sulfate [Neomycin]     Doesn't remember - allergist said not to use it because it could add more allergies   . Ofloxacin Itching  . Other     Corn fillers, corn by-products - causes severe itching Polyester-"pins sticking in her skin" Gold  . Valsartan Other (See Comments)    Pt reports this med causes her to feel sluggish and she feels heaviness on her shoulders.  . Adhesive [Tape] Rash  . Ibuprofen Rash  . Mold Extract [Trichophyton Mentagrophyte] Other (See Comments)    Bumps on back, stops up sinuses.  . Nickel Rash  . Tylenol  [Acetaminophen] Rash    On face    Patient Measurements: Height: 5\' 6"  (167.6 cm) Weight: 180 lb (81.6 kg) IBW/kg (Calculated) : 59.3 Heparin Dosing Weight: 78kg  Vital Signs: Temp: 98.3 F (36.8 C) (04/23 1324) Temp Source: Oral (04/23 1324) BP: 126/59 (04/23 2030) Pulse Rate: 71 (04/23 2030)  Labs: Recent Labs    11/25/18 1500 11/25/18 1808  HGB 14.7  --   HCT 45.1  --   PLT 320  --   CREATININE 1.09*  --   TROPONINI <0.03 <0.03    Estimated Creatinine Clearance: 51 mL/min (A) (by C-G formula based on SCr of 1.09 mg/dL (H)).   Medical History: Past Medical History:  Diagnosis Date  . Allergic rhinitis, cause unspecified   . Anemia   . Aneurysm of right conjunctiva    right eye   . Anxiety   . Asthma   . Barrett's esophagus   . CAD in native artery    a. 11/2017: STEMI s/p DES to prox-mid LAD; LCx stenosis managed medically. Case complicated by R forearm hematoma. // LHC 6/19: LAD stent patent, severe dz in branches of D1 (not amenable to PCI), mid RCA 40, LCx ostial 60 (neg by FFR) >> med Rx  . CKD (chronic kidney disease), stage II   . Constipation   . Cystocele   . Deviated nasal septum   . Diaphragmatic hernia  without mention of obstruction or gangrene   . Esophageal reflux   . Fibromyalgia   . H/O hiatal hernia   . Hypertension   . Insomnia, unspecified   . Ischemic cardiomyopathy    a. EF 40-45% by echo 11/2017. // Echo 5/19: No new wall motion abnormalities, EF 65, no pericardial effusion, normal aortic root  . Kidney stones    hx of pt see Dr. Risa Grill  . Migraine   . Myalgia and myositis, unspecified   . Nuclear stress tests    Cardiolite 2/18: no ischemia or scar, EF 78; Low Risk  . Osteoarthrosis, unspecified whether generalized or localized, unspecified site   . Peripheral neuropathy   . Pneumonia    hx of  . Pure hypercholesterolemia   . Rectocele   . Scoliosis (and kyphoscoliosis), idiopathic   . Statin intolerance   . Temporomandibular  joint disorders, unspecified   . Wheat allergy    Assessment: Donna Bailey presenting with bilateral shoulder pain, hx of PCI to LAD in 2019, no anticoagulation PTA, CBC wnl.    Goal of Therapy:  Heparin level 0.3-0.7 units/ml Monitor platelets by anticoagulation protocol: Yes   Plan:  Heparin 4000 units IV x1, and gtt at 950 units/hr F/u 8 hour heparin level  Donna Bailey, PharmD Clinical Pharmacist Please check AMION for all [place] numbers 11/25/2018 8:39 PM

## 2018-11-26 ENCOUNTER — Encounter (HOSPITAL_COMMUNITY)
Admission: EM | Disposition: A | Discharge status: Home / Self Care | Payer: Self-pay | Source: Home / Self Care | Attending: Emergency Medicine

## 2018-11-26 DIAGNOSIS — I255 Ischemic cardiomyopathy: Secondary | ICD-10-CM | POA: Diagnosis not present

## 2018-11-26 DIAGNOSIS — I10 Essential (primary) hypertension: Secondary | ICD-10-CM

## 2018-11-26 DIAGNOSIS — M797 Fibromyalgia: Secondary | ICD-10-CM | POA: Diagnosis not present

## 2018-11-26 DIAGNOSIS — I25110 Atherosclerotic heart disease of native coronary artery with unstable angina pectoris: Secondary | ICD-10-CM

## 2018-11-26 DIAGNOSIS — E782 Mixed hyperlipidemia: Secondary | ICD-10-CM

## 2018-11-26 DIAGNOSIS — E785 Hyperlipidemia, unspecified: Secondary | ICD-10-CM | POA: Diagnosis not present

## 2018-11-26 HISTORY — PX: LEFT HEART CATH AND CORONARY ANGIOGRAPHY: CATH118249

## 2018-11-26 HISTORY — PX: CORONARY PRESSURE/FFR STUDY: CATH118243

## 2018-11-26 LAB — POCT ACTIVATED CLOTTING TIME
Activated Clotting Time: 312 seconds
Activated Clotting Time: 268 seconds
Activated Clotting Time: 219 seconds
Activated Clotting Time: 191 seconds
Activated Clotting Time: 175 seconds

## 2018-11-26 LAB — CBC
HCT: 36 % (ref 36.0–46.0)
Hemoglobin: 12.1 g/dL (ref 12.0–15.0)
MCH: 31.3 pg (ref 26.0–34.0)
MCHC: 33.6 g/dL (ref 30.0–36.0)
MCV: 93 fL (ref 80.0–100.0)
Platelets: 257 10*3/uL (ref 150–400)
RBC: 3.87 MIL/uL (ref 3.87–5.11)
RDW: 14.5 % (ref 11.5–15.5)
WBC: 6.5 10*3/uL (ref 4.0–10.5)
nRBC: 0 % (ref 0.0–0.2)

## 2018-11-26 LAB — BASIC METABOLIC PANEL
Anion gap: 10 (ref 5–15)
BUN: 17 mg/dL (ref 8–23)
CO2: 19 mmol/L — ABNORMAL LOW (ref 22–32)
Calcium: 8.6 mg/dL — ABNORMAL LOW (ref 8.9–10.3)
Chloride: 112 mmol/L — ABNORMAL HIGH (ref 98–111)
Creatinine, Ser: 1.05 mg/dL — ABNORMAL HIGH (ref 0.44–1.00)
GFR calc Af Amer: >60 mL/min (ref >60)
GFR calc non Af Amer: 53 mL/min — ABNORMAL LOW (ref >60)
Glucose, Bld: 96 mg/dL (ref 70–99)
Potassium: 3.8 mmol/L (ref 3.5–5.1)
Sodium: 141 mmol/L (ref 135–145)

## 2018-11-26 LAB — PROTIME-INR
INR: 1 (ref 0.8–1.2)
Prothrombin Time: 13 seconds (ref 11.4–15.2)

## 2018-11-26 LAB — TROPONIN I
Troponin I: <0.03 ng/mL (ref <0.03)
Troponin I: <0.03 ng/mL (ref <0.03)
Troponin I: <0.03 ng/mL (ref <0.03)

## 2018-11-26 LAB — HEPARIN LEVEL (UNFRACTIONATED): Heparin Unfractionated: 0.73 IU/mL — ABNORMAL HIGH (ref 0.30–0.70)

## 2018-11-26 SURGERY — LEFT HEART CATH AND CORONARY ANGIOGRAPHY
Anesthesia: LOCAL

## 2018-11-26 MED ORDER — FENTANYL CITRATE (PF) 100 MCG/2ML IJ SOLN
INTRAMUSCULAR | Status: DC | PRN
  Administered 2018-11-26: 50 ug via INTRAVENOUS

## 2018-11-26 MED ORDER — SODIUM CHLORIDE 0.9 % IV SOLN: 250.0000 mL | INTRAVENOUS | Status: DC | PRN

## 2018-11-26 MED ORDER — LIDOCAINE HCL (PF) 1 % IJ SOLN
INTRAMUSCULAR | Status: AC
  Filled 2018-11-26: qty 30

## 2018-11-26 MED ORDER — HEPARIN (PORCINE) IN NACL 1000-0.9 UT/500ML-% IV SOLN
INTRAVENOUS | Status: DC | PRN
  Administered 2018-11-26 (×2): 500 mL

## 2018-11-26 MED ORDER — ADENOSINE 12 MG/4ML IV SOLN
INTRAVENOUS | Status: AC
  Filled 2018-11-26: qty 4

## 2018-11-26 MED ORDER — SODIUM CHLORIDE 0.9% FLUSH: 3.0000 mL | INTRAVENOUS | Status: DC | PRN

## 2018-11-26 MED ORDER — SODIUM CHLORIDE 0.9 % IV SOLN: INTRAVENOUS | Status: AC

## 2018-11-26 MED ORDER — HEPARIN SODIUM (PORCINE) 1000 UNIT/ML IJ SOLN
INTRAMUSCULAR | Status: DC | PRN
  Administered 2018-11-26: 10000 [IU] via INTRAVENOUS

## 2018-11-26 MED ORDER — HEPARIN SODIUM (PORCINE) 1000 UNIT/ML IJ SOLN
INTRAMUSCULAR | Status: AC
  Filled 2018-11-26: qty 1

## 2018-11-26 MED ORDER — HEPARIN (PORCINE) IN NACL 1000-0.9 UT/500ML-% IV SOLN
INTRAVENOUS | Status: AC
  Filled 2018-11-26: qty 1000

## 2018-11-26 MED ORDER — HYDRALAZINE HCL 20 MG/ML IJ SOLN: 10.0000 mg | INTRAMUSCULAR | Status: AC | PRN

## 2018-11-26 MED ORDER — MIDAZOLAM HCL 2 MG/2ML IJ SOLN
INTRAMUSCULAR | Status: AC
  Filled 2018-11-26: qty 2

## 2018-11-26 MED ORDER — SODIUM CHLORIDE 0.9% FLUSH: 3.0000 mL | Freq: Two times a day (BID) | INTRAVENOUS | Status: DC

## 2018-11-26 MED ORDER — ADENOSINE (DIAGNOSTIC) 140MCG/KG/MIN
INTRAVENOUS | Status: DC | PRN
  Administered 2018-11-26: 140 ug/kg/min via INTRAVENOUS

## 2018-11-26 MED ORDER — FENTANYL CITRATE (PF) 100 MCG/2ML IJ SOLN
INTRAMUSCULAR | Status: AC
  Filled 2018-11-26: qty 2

## 2018-11-26 MED ORDER — LABETALOL HCL 5 MG/ML IV SOLN: 10.0000 mg | INTRAVENOUS | Status: AC | PRN

## 2018-11-26 MED ORDER — IOHEXOL 350 MG/ML SOLN
INTRAVENOUS | Status: DC | PRN
  Administered 2018-11-26: 11:00:00 105 mL via INTRA_ARTERIAL

## 2018-11-26 MED ORDER — MIDAZOLAM HCL 2 MG/2ML IJ SOLN
INTRAMUSCULAR | Status: DC | PRN
  Administered 2018-11-26: 2 mg via INTRAVENOUS

## 2018-11-26 MED ORDER — LIDOCAINE HCL (PF) 1 % IJ SOLN
INTRAMUSCULAR | Status: DC | PRN
  Administered 2018-11-26: 18 mL

## 2018-11-26 SURGICAL SUPPLY — 15 items
CATH INFINITI 5FR MULTPACK ANG (CATHETERS) ×1 IMPLANT
CATH VISTA GUIDE 6FR XBLAD3.5 (CATHETERS) ×1 IMPLANT
COVER DOME SNAP 22 D (MISCELLANEOUS) ×1 IMPLANT
GUIDEWIRE PRESSURE COMET II (WIRE) ×1 IMPLANT
KIT ESSENTIALS PG (KITS) ×1 IMPLANT
KIT HEART LEFT (KITS) ×2 IMPLANT
KIT MICROPUNCTURE NIT STIFF (SHEATH) ×1 IMPLANT
PACK CARDIAC CATHETERIZATION (CUSTOM PROCEDURE TRAY) ×2 IMPLANT
SHEATH PINNACLE 5F 10CM (SHEATH) ×1 IMPLANT
SHEATH PINNACLE 6F 10CM (SHEATH) ×1 IMPLANT
SHEATH PROBE COVER 6X72 (BAG) ×1 IMPLANT
SYR MEDRAD MARK 7 150ML (SYRINGE) ×2 IMPLANT
TRANSDUCER W/STOPCOCK (MISCELLANEOUS) ×2 IMPLANT
TUBING CIL FLEX 10 FLL-RA (TUBING) ×2 IMPLANT
WIRE EMERALD 3MM-J .035X150CM (WIRE) ×1 IMPLANT

## 2018-11-26 NOTE — Discharge Summary (Addendum)
Discharge Summary    Patient ID: YOONA BALLOWE,  MRN: [id number], DOB/AGE: 1945-09-22 73 y.o.  Admit date: 11/25/2018 Discharge date: 11/27/2018  Primary Care Provider: Daisy Floro Primary Cardiologist: Tobias Alexander, MD  Discharge Diagnoses    Principal Problem:   Unstable angina [facility]) Active Problems:   Fibromyalgia   Hyperlipidemia LDL goal <70   Ischemic cardiomyopathy   Allergies Allergies  Allergen Reactions  . Sulfa Antibiotics Anaphylaxis  . Sulfonamide Derivatives Anaphylaxis  . Penicillins Rash    Underarms (both) Has patient had a PCN reaction causing immediate rash, facial/tongue/throat swelling, SOB or lightheadedness with hypotension: YES Has patient had a PCN reaction causing severe rash involving mucus membranes or skin necrosis: NO Has patient had a PCN reaction that required hospitalization NO Has patient had a PCN reaction occurring within the last 10 years: NO If all of the above answers are "NO", then may proceed with Cephalosporin use.  Joyce Copa [Fexofenadine]     Heart race  . Avelox [Moxifloxacin]     Doesn't remember  . Caffeine     Heart race  . Cephalexin Other (See Comments)    unknown  . Ciprofloxacin Other (See Comments)    unknown  . Corn-Containing Products Itching and Other (See Comments)    Can not walk if have a lot of it.  . Crestor [Rosuvastatin] Other (See Comments)    Lost all muscle mobility   . Doxycycline Diarrhea and Nausea And Vomiting  . Formoterol Fumarate Other (See Comments)    unknown  . Gold-Containing Drug Products     Skin tingle   . Latex     Gets red   . Levofloxacin     REACTION: HEART RACING  . Macrodantin [Nitrofurantoin Macrocrystal] Itching  . Neomycin Sulfate [Neomycin]     Doesn't remember - allergist said not to use it because it could add more allergies   . Ofloxacin Itching  . Other     Corn fillers, corn by-products - causes severe itching Polyester-"pins sticking in her  skin" Gold  . Valsartan Other (See Comments)    Pt reports this med causes her to feel sluggish and she feels heaviness on her shoulders.  . Adhesive [Tape] Rash  . Ibuprofen Rash  . Mold Extract [Trichophyton Mentagrophyte] Other (See Comments)    Bumps on back, stops up sinuses.  . Nickel Rash  . Tylenol [Acetaminophen] Rash    On face    Diagnostic Studies/Procedures    Left Heart Catheterization 11/26/2018:  1st Diag lesion is 60% stenosed.  Lat 1st Diag lesion is 50% stenosed.  Ost Cx to Prox Cx lesion is 60% stenosed.  Mid RCA lesion is 40% stenosed.  Previously placed Prox LAD to Mid LAD stent (unknown type) is widely patent.  The left ventricular systolic function is normal.  LV end diastolic pressure is normal.  The left ventricular ejection fraction is greater than 65% by visual estimate.  There is no mitral valve regurgitation.   1. Patent stent mid LAD with no restenosis 2. Moderate non-obstructive disease in the ostial Circumflex and mid Diagonal, unchanged angiographically from last cath in June 2019. FFR of the Circumflex suggested the stenosis is not flow limiting (FFR 0.94) 3. Mild non-obstructive disease in the RCA 4. Normal LV systolic function  Recommendations: Continue medical management of CAD. Consider other causes for chest pain.  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  She was previously followed by Dr. Donnie Aho then Dr. Okey Dupre but switched to Dr. Delton See after Dr. Okey Dupre went to [facility]. She was admitted in  April 2019 with ST elevation myocardial infarction. Cardiac catheterization demonstrated 100% thrombotic occlusion of the proximal LAD treated with a drug-eluting stent. There is also severe stenosis in the first diagonal and ostial LCx. Cardiac catheterization was complicated by radial artery dissection and hematoma which was treated conservatively. EF was 40-45% at the time of her MI but improved to 60-65%by Echo in 5/19. Repeat cardiac catheterization in June 2019 demonstrated a patent stent in the LAD with moderate to severe disease involving the branches of the first diagonal and moderate nonobstructive disease in the RCA. The diagonal vessel wasrelatively small and not a good target for PCI. There was ostial 60% stenosis in the LCx which was not hemodynamically significant by FFR. Medical therapy was recommended. There was previously a recommendation to change Brilinta to Plavix but this has been avoided due to prior intolerance and corn filler allergy. She had intolerance to Repatha and was switched to Praluent but was unable to take this due toreported side effect ofshaking. Her anginal symptoms have been controlled on isosorbide and ranolazine although Ranexa had to be reduced due to intolerance of higher dose. She is no longer on isosorbide for unclear reasons; she thinks she was taken off of it and denies any known reactions to it.At OV with Dr. Delton See 09/01/18 she was describing fatigue and pain in 12 points in her body. Nuclear stress test 2/6/20was performed and wasnormal. When I met her in 10/2017 she was experiencing higher blood pressure as well as numerous somatic complaints with ongoing knee pain,left eye pain for which she is seeing various specialists, history of elevated pressure in her spinal fluid which has impacted her sleep, and brief episodes of stabbing focal chest discomfort and focal jaw pain. Felodipine was addedfor blood pressure to optimize regimen(chosen becausethe patient  declined amlodipine due to corn filler ingredient).When contactedby the nurse on 11/16/18 to go over instructions for her telehealth visit,she reported numerous side effects to valsartan including heavy feeling to both shoulders, feeling like she has a ton of weights pushing down on her shoulders, neck pain, feeling sluggish which she attributed to small amount of corn in it. Therefore, valsartan was discontinued and felodipine was increased to 10mg  daily. Last labs 10/2018 showed normal TSH, WBC 15.6, K 5.2, Cr 0.89, 05/2018 showed LDL 49, normal LFTs, 04/2018 Hgb 12.0.  She was seen by telehealth 11/25/2018 and reported feeling terrible for the last 3 weeks with a generalized sensation of worsening pressure bilaterally in her shoulders. This has not resolved with discontinuation of valsartan. The quality reminds her of her prior MI, described as a heaviness. It is difficult to ascertain if it is worse with exertion as it also has been occurring while doing nothing at all. She even had it at the time of her telehealth visit, present since this morning - she could barely stand to scramble eggs this morning because it took everything she had and caused significant lightheadedness. She can only do 5 minutes on the stationary bike before feeling very fatigued. She also reports  significant headache. Her BP was 161/97 this morning while feeling lightheaded/bad, and down to 126/71 a short while later.   She was advised to come to the ER, while there she developed another episode of chest pain - ECG at that point showed flatened T waves - change to prior ECG. Troponin negative x 2.   Hospital Course     Consultants: None   1. Unstable angina in patient with known CAD s/p PCI/DES to pLAD 11/2017: patient was seen for a telehealth visit 11/25/2018 and felt to have symptoms c/f unstable angina. She was sent to the ED for further evaluation. Troponins were negative, however during an episode of CP she was noted to  have acute T-wave flattening on EKG. She underwent LHC 11/26/2018 which showed patent p-mLAD stent, and no significant change in her moderate non-obstructive disease in the osteal Cx and mid diagonal or mild RCA disease. She was recommended to continue current medical management and consider non-cardiac causes of chest pain. Possible fibromyalgia is contributing. - Continue aspirin, brilinta, and statin - Continue carvedilol, ranexa   2. Ischemic cardiomyopathy: improved on follow-up echocardiogram - Continue carvedilol  3. HLD: intolerant to high intensity statins and repatha - Continue atorvastatin   4. GERD:  - Continue nexium  5. Fibromyalgia: possible this is contributing to her chest/shoulder pain - Continue to follow-up with PCP for ongoing management.   _____________  Discharge Vitals Blood pressure (!) 144/70, pulse 70, temperature 98 F (36.7 C), temperature source Oral, resp. rate 10, height 5\' 6"  (1.676 m), weight 85.5 kg, SpO2 92 %.  Filed Weights   11/25/18 2137 11/26/18 0442 11/27/18 0418  Weight: 84.7 kg 84.7 kg 85.5 kg    Labs & Radiologic Studies    CBC Recent Labs    11/25/18 1500 11/26/18 0821 11/27/18 0337  WBC 7.4 6.5 6.6  NEUTROABS 4.1  --   --   HGB 14.7 12.1 12.0  HCT 45.1 36.0 35.5*  MCV 94.2 93.0 93.2  PLT 320 257 241   Basic Metabolic Panel Recent Labs    72/53/66 0821 11/27/18 0337  NA 141 140  K 3.8 3.9  CL 112* 111  CO2 19* 21*  GLUCOSE 96 107*  BUN 17 15  CREATININE 1.05* 1.01*  CALCIUM 8.6* 8.7*   Liver Function Tests Recent Labs    11/25/18 1500  AST 29  ALT 27  ALKPHOS 79  BILITOT 0.6  PROT 7.1  ALBUMIN 3.8   No results for input(s): LIPASE, AMYLASE in the last 72 hours. Cardiac Enzymes Recent Labs    11/25/18 2300 11/26/18 0239 11/26/18 0821  TROPONINI <0.03 <0.03 <0.03   BNP Invalid input(s): POCBNP D-Dimer No results for input(s): DDIMER in the last 72 hours. Hemoglobin A1C No results for input(s):  HGBA1C in the last 72 hours. Fasting Lipid Panel No results for input(s): CHOL, HDL, LDLCALC, TRIG, CHOLHDL, LDLDIRECT in the last 72 hours. Thyroid Function Tests No results for input(s): TSH, T4TOTAL, T3FREE, THYROIDAB in the last 72 hours.  Invalid input(s): FREET3 _____________  Dg Chest Port 1 View  Result Date: 11/25/2018 CLINICAL DATA:  Chest pain EXAM: PORTABLE CHEST 1 VIEW COMPARISON:  April 25, 2018. FINDINGS: There is no appreciable edema or consolidation. There is slight scarring in the right mid lung. Heart size and pulmonary vascularity are normal. No adenopathy. No pneumothorax. No bone lesions. IMPRESSION: Mild scarring right mid lung. No edema or consolidation. Stable cardiac silhouette. Electronically Signed   By: Bretta Bang III  M.D.   On: 11/25/2018 14:19   Disposition   Patient was seen and examined by Dr. Elberta Fortis who deemed patient as stable for discharge. Follow-up has been arranged. Discharge medications as listed below.   Follow-up Plans & Appointments    Follow-up Information    Beatrice Lecher, PA-C Follow up on 12/29/2018.   Specialties:  Cardiology, Physician Assistant Why:  You have been scheduled fo a virtual visit with Tereso Newcomer 12/29/2018 at 8:15am. You Donnis Phaneuf be contacted 2-3 days prior to your visit with additional information. Office Jaydan Chretien call you to set up for your outpatient cardiac monitor. Contact information: [address] [place] [address] [phone]        Daisy Floro, MD Follow up.   Specialty:  Family Medicine Why:  Please call to schedule an appointment with your PCP within 2 weeks of discharge or sooner if you continue to experience chest pain to discuss non-cardiac causes. Contact information: [address] [place] [address] [phone]          Discharge Instructions    Diet - low sodium heart healthy   Complete by:  As directed    Discharge instructions   Complete by:  As  directed    Groin Site Care Refer to this sheet in the next few weeks. These instructions provide you with information on caring for yourself after your procedure. Your caregiver may also give you more specific instructions. Your treatment has been planned according to current medical practices, but problems sometimes occur. Call your caregiver if you have any problems or questions after your procedure. HOME CARE INSTRUCTIONS You may shower 24 hours after the procedure. Remove the bandage (dressing) and gently wash the site with plain soap and water. Gently pat the site dry.  Do not apply powder or lotion to the site.  Do not sit in a bathtub, swimming pool, or whirlpool for 5 to 7 days.  No bending, squatting, or lifting anything over 10 pounds (4.5 kg) as directed by your caregiver.  Inspect the site at least twice daily.  Do not drive home if you are discharged the same day of the procedure. Have someone else drive you.  You may drive 24 hours after the procedure unless otherwise instructed by your caregiver.  What to expect: Any bruising Mally Gavina usually fade within 1 to 2 weeks.  Blood that collects in the tissue (hematoma) may be painful to the touch. It should usually decrease in size and tenderness within 1 to 2 weeks.  SEEK IMMEDIATE MEDICAL CARE IF: You have unusual pain at the groin site or down the affected leg.  You have redness, warmth, swelling, or pain at the groin site.  You have drainage (other than a small amount of blood on the dressing).  You have chills.  You have a fever or persistent symptoms for more than 72 hours.  You have a fever and your symptoms suddenly get worse.  Your leg becomes pale, cool, tingly, or numb.  You have heavy bleeding from the site. Hold pressure on the site. .   Increase activity slowly   Complete by:  As directed    MyChart COVID-19 home monitoring program   Complete by:  Nov 27, 2018    Is the patient willing to use a smartphone for remote  monitoring via the MyChart app?:  Yes   Temperature monitoring   Complete by:  Nov 27, 2018    After how many days  would you like to receive a notification of this patient's flowsheet entries?:  1      Discharge Medications   Allergies as of 11/27/2018      Reactions   Sulfa Antibiotics Anaphylaxis   Sulfonamide Derivatives Anaphylaxis   Penicillins Rash   Underarms (both) Has patient had a PCN reaction causing immediate rash, facial/tongue/throat swelling, SOB or lightheadedness with hypotension: YES Has patient had a PCN reaction causing severe rash involving mucus membranes or skin necrosis: NO Has patient had a PCN reaction that required hospitalization NO Has patient had a PCN reaction occurring within the last 10 years: NO If all of the above answers are "NO", then may proceed with Cephalosporin use.   Allegra [fexofenadine]    Heart race   Avelox [moxifloxacin]    Doesn't remember   Caffeine    Heart race   Cephalexin Other (See Comments)   unknown   Ciprofloxacin Other (See Comments)   unknown   Corn-containing Products Itching, Other (See Comments)   Can not walk if have a lot of it.   Crestor [rosuvastatin] Other (See Comments)   Lost all muscle mobility    Doxycycline Diarrhea, Nausea And Vomiting   Formoterol Fumarate Other (See Comments)   unknown   Gold-containing Drug Products    Skin tingle    Latex    Gets red    Levofloxacin    REACTION: HEART RACING   Macrodantin [nitrofurantoin Macrocrystal] Itching   Neomycin Sulfate [neomycin]    Doesn't remember - allergist said not to use it because it could add more allergies    Ofloxacin Itching   Other    Corn fillers, corn by-products - causes severe itching Polyester-"pins sticking in her skin" Gold   Valsartan Other (See Comments)   Pt reports this med causes her to feel sluggish and she feels heaviness on her shoulders.   Adhesive [tape] Rash   Ibuprofen Rash   Mold Extract [trichophyton  Mentagrophyte] Other (See Comments)   Bumps on back, stops up sinuses.   Nickel Rash   Tylenol [acetaminophen] Rash   On face      Medication List    TAKE these medications   aspirin 81 MG chewable tablet Chew 1 tablet (81 mg total) by mouth daily.   atorvastatin 10 MG tablet Commonly known as:  LIPITOR Take 1 tablet (10 mg total) by mouth daily.   carvedilol 6.25 MG tablet Commonly known as:  COREG Take 1 tablet (6.25 mg total) by mouth 2 (two) times daily with a meal.   diphenhydrAMINE 25 mg capsule Commonly known as:  BENADRYL Take 25 mg by mouth every 6 (six) hours as needed for itching or allergies.   esomeprazole 20 MG capsule Commonly known as:  NEXIUM Take 20 mg by mouth daily at 12 noon.   felodipine 10 MG 24 hr tablet Commonly known as:  PLENDIL Take 1 tablet (10 mg total) by mouth daily.   guaiFENesin-codeine 100-10 MG/5ML syrup Commonly known as:  ROBITUSSIN AC Take 5 mLs by mouth every 4 (four) hours as needed for cough or congestion.   nitroGLYCERIN 0.4 MG SL tablet Commonly known as:  NITROSTAT Place 1 tablet (0.4 mg total) under the tongue every 5 (five) minutes as needed for chest pain.   ondansetron 4 MG tablet Commonly known as:  ZOFRAN Take 1 tablet (4 mg total) by mouth every 8 (eight) hours as needed for nausea or vomiting.   ranolazine 500 MG 12 hr tablet Commonly  known as:  Ranexa Take 1 tablet (500 mg total) by mouth 2 (two) times daily.   ticagrelor 90 MG Tabs tablet Commonly known as:  BRILINTA Take 1 tablet (90 mg total) by mouth 2 (two) times daily.   VITAMIN B-12 IJ Inject 1,000 mg as directed every 30 (thirty) days.   zolpidem 10 MG tablet Commonly known as:  AMBIEN Take 1 tablet (10 mg total) by mouth at bedtime.        Outstanding Labs/Studies   Outpatient cardiac monitor.  Duration of Discharge Encounter   Greater than 30 minutes including physician time.  Signed, Laverda Page NP-C 11/27/2018, 10:35 AM     I have seen and examined this patient with Laverda Page.  Agree with above, note added to reflect my findings.  On exam, regular rhythm, no murmurs, lungs clear.  Patient admitted to the hospital with chest pain and fatigue.  Left heart catheterization showed stable coronary artery disease.  At this point, no further coronary work-up is needed.  She does have weakness and fatigue.  We Imre Vecchione plan for outpatient cardiac monitoring and follow-up with primary cardiology..    Myria Steenbergen M. Junior Huezo MD 11/27/2018 11:31 AM

## 2018-11-26 NOTE — Progress Notes (Signed)
Pt having 9/10 back pain and nausea, consistent symptoms with previous MI. Placed pt on 2L O2, gave 1 SL nitro, obtained EKG. Notified MD. Pt scheduled for heart cath later today. Will continue to monitor.

## 2018-11-26 NOTE — Discharge Instructions (Signed)
PLEASE REMEMBER TO BRING ALL OF YOUR MEDICATIONS TO EACH OF YOUR FOLLOW-UP OFFICE VISITS.  PLEASE ATTEND ALL SCHEDULED FOLLOW-UP APPOINTMENTS.   Activity: Increase activity slowly as tolerated. You may shower, but no soaking baths (or swimming) for 1 week. No driving for 24 hours. No lifting over 5 lbs for 1 week. No sexual activity for 1 week.   You May Return to Work: in 1 week (if applicable)  Wound Care: You may wash cath site gently with soap and water. Keep cath site clean and dry. If you notice pain, swelling, bleeding or pus at your cath site, please call [phone].  _____________________________________________________________________________________________________  YOUR CARDIOLOGY TEAM HAS ARRANGED FOR AN E-VISIT FOR YOUR APPOINTMENT - PLEASE REVIEW IMPORTANT INFORMATION BELOW SEVERAL DAYS PRIOR TO YOUR APPOINTMENT  Due to the recent COVID-19 pandemic, we are transitioning in-person office visits to tele-medicine visits in an effort to decrease unnecessary exposure to our patients, their families, and staff. These visits are billed to your insurance just like a normal visit is. We also encourage you to sign up for MyChart if you have not already done so. You will need a smartphone if possible. For patients that do not have this, we can still complete the visit using a regular telephone but do prefer a smartphone to enable video when possible. You may have a family member that lives with you that can help. If possible, we also ask that you have a blood pressure cuff and scale at home to measure your blood pressure, heart rate and weight prior to your scheduled appointment. Patients with clinical needs that need an in-person evaluation and testing will still be able to come to the office if absolutely necessary. If you have any questions, feel free to call our office.     YOUR PROVIDER WILL BE USING ONE OF THE FOLLOWING PLATFORMS TO COMPLETE YOUR VISIT:    IF USING MYCHART - How to  Download the MyChart App to Your SmartPhone   - If Apple, go to [employer] and type in MyChart in the search bar and download the app. If Android, ask patient to go to [employer] and type in [place] in the search bar and download the app. The app is free but as with any other app downloads, your phone may require you to verify saved payment information or Apple/Android password.  - You will need to then log into the app with your MyChart username and password, and select [facility] as your healthcare provider to link the account.  - When it is time for your visit, go to the MyChart app, find appointments, and click Begin Video Visit. Be sure to Select Allow for your device to access the Microphone and Camera for your visit. You will then be connected, and your provider will be with you shortly.  **If you have any issues connecting or need assistance, please contact MyChart service desk [phone])**  **If using a computer, in order to ensure the best quality for your visit, you will need to use either of the following Internet Browsers: Insurance underwriter or Microsoft Edge**   IF USING DOXIMITY or [email or web address].ME - The staff will give you instructions on receiving your link to join the meeting the day of your visit.      2-3 DAYS BEFORE YOUR APPOINTMENT  You will receive a telephone call from one of our [facility] team members - your caller ID may say "Unknown caller." If this is a video visit, we will  walk you through how to get the video launched on your phone. We will remind you check your blood pressure, heart rate and weight prior to your scheduled appointment. If you have an Apple Watch or Kardia, please upload any pertinent ECG strips the day before or morning of your appointment to [facility]. Our staff will also make sure you have reviewed the consent and agree to move forward with your scheduled tele-health visit.     THE DAY OF YOUR APPOINTMENT  Approximately 15 minutes prior  to your scheduled appointment, you will receive a telephone call from one of [facility] team - your caller ID may say "Unknown caller."  Our staff will confirm medications, vital signs for the day and any symptoms you may be experiencing. Please have this information available prior to the time of visit start. It may also be helpful for you to have a pad of paper and pen handy for any instructions given during your visit. They will also walk you through joining the smartphone meeting if this is a video visit.    CONSENT FOR TELE-HEALTH VISIT - PLEASE REVIEW  I hereby voluntarily request, consent and authorize [facility] and its employed or contracted physicians, physician assistants, nurse practitioners or other licensed health care professionals (the Practitioner), to provide me with telemedicine health care services (the Services") as deemed necessary by the treating Practitioner. I acknowledge and consent to receive the Services by the Practitioner via telemedicine. I understand that the telemedicine visit will involve communicating with the Practitioner through live audiovisual communication technology and the disclosure of certain medical information by electronic transmission. I acknowledge that I have been given the opportunity to request an in-person assessment or other available alternative prior to the telemedicine visit and am voluntarily participating in the telemedicine visit.  I understand that I have the right to withhold or withdraw my consent to the use of telemedicine in the course of my care at any time, without affecting my right to future care or treatment, and that the Practitioner or I may terminate the telemedicine visit at any time. I understand that I have the right to inspect all information obtained and/or recorded in the course of the telemedicine visit and may receive copies of available information for a reasonable fee.  I understand that some of the potential risks of  receiving the Services via telemedicine include:   Delay or interruption in medical evaluation due to technological equipment failure or disruption;  Information transmitted may not be sufficient (e.g. poor resolution of images) to allow for appropriate medical decision making by the Practitioner; and/or   In rare instances, security protocols could fail, causing a breach of personal health information.  Furthermore, I acknowledge that it is my responsibility to provide information about my medical history, conditions and care that is complete and accurate to the best of my ability. I acknowledge that Practitioner's advice, recommendations, and/or decision may be based on factors not within their control, such as incomplete or inaccurate data provided by me or distortions of diagnostic images or specimens that may result from electronic transmissions. I understand that the practice of medicine is not an exact science and that Practitioner makes no warranties or guarantees regarding treatment outcomes. I acknowledge that I will receive a copy of this consent concurrently upon execution via email to the email address I last provided but may also request a printed copy by calling the office of [facility].    I understand that my insurance will be  billed for this visit.   I have read or had this consent read to me.  I understand the contents of this consent, which adequately explains the benefits and risks of the Services being provided via telemedicine.   I have been provided ample opportunity to ask questions regarding this consent and the Services and have had my questions answered to my satisfaction.  I give my informed consent for the services to be provided through the use of telemedicine in my medical care  By participating in this telemedicine visit I agree to the above.    _________________________________________________________________________  Please follow-up with your primary  care provider to consider alternative explanations for your symptoms given unchanged cardiac catheterization.

## 2018-11-26 NOTE — CV Procedure (Signed)
     6 Fr R F/A sheath was was pulled mannualy, and pressure was held for 30 min, Small hematoma, 1x1, was formed around the sheath before the sheath pull.. There is a bruise in that area.the area is soft and non tender . Sterile gauze was applied at the site. Bed rest started at 1645 x 4 hr. Instructions were given about the bed rest . DP was palpable before and after the sheath pull. BP 143/89 HR 67 NSR sPO2 99% on R/A

## 2018-11-26 NOTE — Progress Notes (Signed)
Progress Note  Patient Name: Donna Bailey Date of Encounter: 11/26/2018  Primary Cardiologist: Ena Dawley, MD   Subjective   Donna Bailey is a 73 year old female with a history of coronary artery disease.  She is status post DES to the proximal left anterior descending artery.  She has some residual disease that was treated medically.  This was complicated by a radial artery dissection/hematoma.  She is previously followed by Dr. Wynonia Lawman.  And now has been seen by Dr. Meda Coffee. She was seen by telehealth medicine yesterday by Dr. Meda Coffee.  She reports feeling poorly.  She said generalized chest pressure between her shoulders. The chest pressure is worsened with exercise.  She is also noted some elevated blood pressure.  She presented to the emergency room yesterday.  She had some EKG changes and was admitted.  Troponin levels remain negative.  She is scheduled for heart catheterization today.  Inpatient Medications    Scheduled Meds: . aspirin  324 mg Oral Once  . aspirin  81 mg Oral Daily  . atorvastatin  10 mg Oral Daily  . carvedilol  6.25 mg Oral BID WC  . felodipine  10 mg Oral Daily  . pantoprazole  80 mg Oral Q1200  . ranolazine  500 mg Oral BID  . sodium chloride flush  3 mL Intravenous Q12H  . sodium chloride flush  3 mL Intravenous Q12H  . ticagrelor  90 mg Oral BID  . zolpidem  5 mg Oral QHS   Continuous Infusions: . sodium chloride    . sodium chloride    . sodium chloride 1 mL/kg/hr (11/26/18 0700)  . heparin 850 Units/hr (11/26/18 0815)   PRN Meds: sodium chloride, sodium chloride, diphenhydrAMINE, guaiFENesin-codeine, hydrocortisone cream, nitroGLYCERIN, ondansetron (ZOFRAN) IV, ondansetron, sodium chloride flush, sodium chloride flush, zolpidem   Vital Signs    Vitals:   11/25/18 2000 11/25/18 2030 11/25/18 2137 11/26/18 0442  BP: 121/74 (!) 126/59 137/81 130/66  Pulse: 72 71 75 87  Resp: 14 14 20 18   Temp:   98.1 F (36.7 C) 97.9 F (36.6 C)   TempSrc:   Oral Oral  SpO2: 95% 97% 99% 97%  Weight:   84.7 kg 84.7 kg  Height:   5\' 6"  (1.676 m)     Intake/Output Summary (Last 24 hours) at 11/26/2018 0830 Last data filed at 11/26/2018 0700 Gross per 24 hour  Intake 636.85 ml  Output 400 ml  Net 236.85 ml   Last 3 Weights 11/26/2018 11/25/2018 11/25/2018  Weight (lbs) 186 lb 11.7 oz 186 lb 11.7 oz 180 lb  Weight (kg) 84.7 kg 84.7 kg 81.647 kg      Telemetry     NSR  - Personally Reviewed  ECG     NSR  - Personally Reviewed  Physical Exam   GEN: No acute distress.   Neck: No JVD Cardiac: RRR, no murmurs, rubs, or gallops.  Respiratory: Clear to auscultation bilaterally. GI: Soft, nontender, non-distended  MS: No edema; No deformity. Neuro:  Nonfocal  Psych: Normal affect   Labs    Chemistry Recent Labs  Lab 11/25/18 1500  NA 141  K 4.3  CL 107  CO2 22  GLUCOSE 93  BUN 15  CREATININE 1.09*  CALCIUM 9.3  PROT 7.1  ALBUMIN 3.8  AST 29  ALT 27  ALKPHOS 79  BILITOT 0.6  GFRNONAA 51*  GFRAA 59*  ANIONGAP 12     Hematology Recent Labs  Lab 11/25/18 1500  WBC 7.4  RBC 4.79  HGB 14.7  HCT 45.1  MCV 94.2  MCH 30.7  MCHC 32.6  RDW 14.6  PLT 320    Cardiac Enzymes Recent Labs  Lab 11/25/18 1500 11/25/18 1808 11/25/18 2300 11/26/18 0239  TROPONINI <0.03 <0.03 <0.03 <0.03   No results for input(s): TROPIPOC in the last 168 hours.   BNPNo results for input(s): BNP, PROBNP in the last 168 hours.   DDimer No results for input(s): DDIMER in the last 168 hours.   Radiology    Dg Chest Port 1 View  Result Date: 11/25/2018 CLINICAL DATA:  Chest pain EXAM: PORTABLE CHEST 1 VIEW COMPARISON:  April 25, 2018. FINDINGS: There is no appreciable edema or consolidation. There is slight scarring in the right mid lung. Heart size and pulmonary vascularity are normal. No adenopathy. No pneumothorax. No bone lesions. IMPRESSION: Mild scarring right mid lung. No edema or consolidation. Stable cardiac  silhouette. Electronically Signed   By: Lowella Grip III M.D.   On: 11/25/2018 14:19    Cardiac Studies     Patient Profile     73 y.o. female with cad   Assessment & Plan    1.  Coronary artery disease/unstable angina: The patient has a history of a non-ST segment elevation myocardial infarction last year.  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  She is scheduled for heart catheterization today.  Her right femoral pulse is excellent.  She will need to be cath from the right femoral artery given the complications from her right radial artery.  I discussed the risks, benefits, options of heart catheterization.  She understands and agrees to proceed.  2.  Essential hypertension: Continue current medications.  3.  Hyperlipidemia: Her last LDL was 49.  Continue current medications.  For questions or updates, please contact [facility] Please consult [email or web address] for contact info under        Signed, Mertie Moores, MD  11/26/2018, 8:30 AM

## 2018-11-26 NOTE — Care Management Obs Status (Signed)
[facility] NOTIFICATION   Patient Details  Name: Donna Bailey MRN: [id number] Date of Birth: 1945/09/08   Medicare Observation Status Notification Given:       Laurena Slimmer, RN 11/26/2018, 5:36 PM

## 2018-11-26 NOTE — Progress Notes (Signed)
ANTICOAGULATION CONSULT NOTE -  Pharmacy Consult for heparin Indication: chest pain/ACS  Allergies  Allergen Reactions  . Sulfa Antibiotics Anaphylaxis  . Sulfonamide Derivatives Anaphylaxis  . Penicillins Rash    Underarms (both) Has patient had a PCN reaction causing immediate rash, facial/tongue/throat swelling, SOB or lightheadedness with hypotension: YES Has patient had a PCN reaction causing severe rash involving mucus membranes or skin necrosis: NO Has patient had a PCN reaction that required hospitalization NO Has patient had a PCN reaction occurring within the last 10 years: NO If all of the above answers are "NO", then may proceed with Cephalosporin use.  Delma Freeze [Fexofenadine]     Heart race  . Avelox [Moxifloxacin]     Doesn't remember  . Caffeine     Heart race  . Cephalexin Other (See Comments)    unknown  . Ciprofloxacin Other (See Comments)    unknown  . Corn-Containing Products Itching and Other (See Comments)    Can not walk if have a lot of it.  . Crestor [Rosuvastatin] Other (See Comments)    Lost all muscle mobility   . Doxycycline Diarrhea and Nausea And Vomiting  . Formoterol Fumarate Other (See Comments)    unknown  . Gold-Containing Drug Products     Skin tingle   . Latex     Gets red   . Levofloxacin     REACTION: HEART RACING  . Macrodantin [Nitrofurantoin Macrocrystal] Itching  . Neomycin Sulfate [Neomycin]     Doesn't remember - allergist said not to use it because it could add more allergies   . Ofloxacin Itching  . Other     Corn fillers, corn by-products - causes severe itching Polyester-"pins sticking in her skin" Gold  . Valsartan Other (See Comments)    Pt reports this med causes her to feel sluggish and she feels heaviness on her shoulders.  . Adhesive [Tape] Rash  . Ibuprofen Rash  . Mold Extract [Trichophyton Mentagrophyte] Other (See Comments)    Bumps on back, stops up sinuses.  . Nickel Rash  . Tylenol [Acetaminophen]  Rash    On face    Patient Measurements: Height: 5\' 6"  (167.6 cm) Weight: 186 lb 11.7 oz (84.7 kg) IBW/kg (Calculated) : 59.3 Heparin Dosing Weight: 78kg  Vital Signs: Temp: 97.9 F (36.6 C) (04/24 0442) Temp Source: Oral (04/24 0442) BP: 130/66 (04/24 0442) Pulse Rate: 87 (04/24 0442)  Labs: Recent Labs    11/25/18 1500 11/25/18 1808 11/25/18 2300 11/26/18 0239  HGB 14.7  --   --   --   HCT 45.1  --   --   --   PLT 320  --   --   --   LABPROT  --   --  13.0  --   INR  --   --  1.0  --   HEPARINUNFRC  --   --   --  0.73*  CREATININE 1.09*  --   --   --   TROPONINI <0.03 <0.03 <0.03 <0.03    Estimated Creatinine Clearance: 51.9 mL/min (A) (by C-G formula based on SCr of 1.09 mg/dL (H)).   Assessment: 71 YOF presenting with bilateral shoulder pain, hx of PCI to LAD in 2019, no anticoagulation PTA, CBC wnl.  IV heparin started for r/o ACS.    Heparin level this AM just slightly above goal range.  CBC WNL, not drawn yet today.   Goal of Therapy:  Heparin level 0.3-0.7 units/ml Monitor platelets by anticoagulation  protocol: Yes   Plan:  Decrease heparin to 850 units/hr Repeat heparin level in 8 hrs. Daily heparin level and CBC. F/u plans for anticoagulation after cath lab today.  Marguerite Olea, [facility] Clinical Pharmacist Phone [phone]  11/26/2018 8:21 AM

## 2018-11-26 NOTE — H&P (View-Only) (Signed)
Progress Note  Patient Name: Donna Bailey Date of Encounter: 11/26/2018  Primary Cardiologist: Ena Dawley, MD   Subjective   Donna Bailey is a 73 year old female with a history of coronary artery disease.  She is status post DES to the proximal left anterior descending artery.  She has some residual disease that was treated medically.  This was complicated by a radial artery dissection/hematoma.  She is previously followed by Dr. Wynonia Lawman.  And now has been seen by Dr. Meda Coffee. She was seen by telehealth medicine yesterday by Dr. Meda Coffee.  She reports feeling poorly.  She said generalized chest pressure between her shoulders. The chest pressure is worsened with exercise.  She is also noted some elevated blood pressure.  She presented to the emergency room yesterday.  She had some EKG changes and was admitted.  Troponin levels remain negative.  She is scheduled for heart catheterization today.  Inpatient Medications    Scheduled Meds: . aspirin  324 mg Oral Once  . aspirin  81 mg Oral Daily  . atorvastatin  10 mg Oral Daily  . carvedilol  6.25 mg Oral BID WC  . felodipine  10 mg Oral Daily  . pantoprazole  80 mg Oral Q1200  . ranolazine  500 mg Oral BID  . sodium chloride flush  3 mL Intravenous Q12H  . sodium chloride flush  3 mL Intravenous Q12H  . ticagrelor  90 mg Oral BID  . zolpidem  5 mg Oral QHS   Continuous Infusions: . sodium chloride    . sodium chloride    . sodium chloride 1 mL/kg/hr (11/26/18 0700)  . heparin 850 Units/hr (11/26/18 0815)   PRN Meds: sodium chloride, sodium chloride, diphenhydrAMINE, guaiFENesin-codeine, hydrocortisone cream, nitroGLYCERIN, ondansetron (ZOFRAN) IV, ondansetron, sodium chloride flush, sodium chloride flush, zolpidem   Vital Signs    Vitals:   11/25/18 2000 11/25/18 2030 11/25/18 2137 11/26/18 0442  BP: 121/74 (!) 126/59 137/81 130/66  Pulse: 72 71 75 87  Resp: 14 14 20 18   Temp:   98.1 F (36.7 C) 97.9 F (36.6 C)   TempSrc:   Oral Oral  SpO2: 95% 97% 99% 97%  Weight:   84.7 kg 84.7 kg  Height:   5\' 6"  (1.676 m)     Intake/Output Summary (Last 24 hours) at 11/26/2018 0830 Last data filed at 11/26/2018 0700 Gross per 24 hour  Intake 636.85 ml  Output 400 ml  Net 236.85 ml   Last 3 Weights 11/26/2018 11/25/2018 11/25/2018  Weight (lbs) 186 lb 11.7 oz 186 lb 11.7 oz 180 lb  Weight (kg) 84.7 kg 84.7 kg 81.647 kg      Telemetry     NSR  - Personally Reviewed  ECG     NSR  - Personally Reviewed  Physical Exam   GEN: No acute distress.   Neck: No JVD Cardiac: RRR, no murmurs, rubs, or gallops.  Respiratory: Clear to auscultation bilaterally. GI: Soft, nontender, non-distended  MS: No edema; No deformity. Neuro:  Nonfocal  Psych: Normal affect   Labs    Chemistry Recent Labs  Lab 11/25/18 1500  NA 141  K 4.3  CL 107  CO2 22  GLUCOSE 93  BUN 15  CREATININE 1.09*  CALCIUM 9.3  PROT 7.1  ALBUMIN 3.8  AST 29  ALT 27  ALKPHOS 79  BILITOT 0.6  GFRNONAA 51*  GFRAA 59*  ANIONGAP 12     Hematology Recent Labs  Lab 11/25/18 1500  WBC 7.4  RBC 4.79  HGB 14.7  HCT 45.1  MCV 94.2  MCH 30.7  MCHC 32.6  RDW 14.6  PLT 320    Cardiac Enzymes Recent Labs  Lab 11/25/18 1500 11/25/18 1808 11/25/18 2300 11/26/18 0239  TROPONINI <0.03 <0.03 <0.03 <0.03   No results for input(s): TROPIPOC in the last 168 hours.   BNPNo results for input(s): BNP, PROBNP in the last 168 hours.   DDimer No results for input(s): DDIMER in the last 168 hours.   Radiology    Dg Chest Port 1 View  Result Date: 11/25/2018 CLINICAL DATA:  Chest pain EXAM: PORTABLE CHEST 1 VIEW COMPARISON:  April 25, 2018. FINDINGS: There is no appreciable edema or consolidation. There is slight scarring in the right mid lung. Heart size and pulmonary vascularity are normal. No adenopathy. No pneumothorax. No bone lesions. IMPRESSION: Mild scarring right mid lung. No edema or consolidation. Stable cardiac  silhouette. Electronically Signed   By: Lowella Grip III M.D.   On: 11/25/2018 14:19    Cardiac Studies     Patient Profile     73 y.o. female with cad   Assessment & Plan    1.  Coronary artery disease/unstable angina: The patient has a history of a non-ST segment elevation myocardial infarction last year.  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  She is scheduled for heart catheterization today.  Her right femoral pulse is excellent.  She will need to be cath from the right femoral artery given the complications from her right radial artery.  I discussed the risks, benefits, options of heart catheterization.  She understands and agrees to proceed.  2.  Essential hypertension: Continue current medications.  3.  Hyperlipidemia: Her last LDL was 49.  Continue current medications.  For questions or updates, please contact [facility] Please consult [email or web address] for contact info under        Signed, Mertie Moores, MD  11/26/2018, 8:30 AM

## 2018-11-26 NOTE — Care Management CC44 (Signed)
Condition Code 44 Documentation Completed  Patient Details  Name: GRAYSEN DEPAULA MRN: [id number] Date of Birth: 1946-03-29   Condition Code 44 given:    Patient signature on Condition Code 44 notice:    Documentation of 2 MD's agreement:    Code 44 added to claim:       Laurena Slimmer, RN 11/26/2018, 5:31 PM

## 2018-11-26 NOTE — Interval H&P Note (Signed)
History and Physical Interval Note:  11/26/2018 9:42 AM  Donna Bailey  has presented today for cardiac cath with the diagnosis of unstable angina.  The various methods of treatment have been discussed with the patient and family. After consideration of risks, benefits and other options for treatment, the patient has consented to  Procedure(s): LEFT HEART CATH AND CORONARY ANGIOGRAPHY (N/A) as a surgical intervention.  The patient's history has been reviewed, patient examined, no change in status, stable for surgery.  I have reviewed the patient's chart and labs.  Questions were answered to the patient's satisfaction.    Cath Lab Visit (complete for each Cath Lab visit)  Clinical Evaluation Leading to the Procedure:   ACS: No.  Non-ACS:    Anginal Classification: CCS III  Anti-ischemic medical therapy: Maximal Therapy (2 or more classes of medications)  Non-Invasive Test Results: No non-invasive testing performed  Prior CABG: No previous CABG         Lauree Chandler

## 2018-11-27 ENCOUNTER — Other Ambulatory Visit: Payer: Self-pay | Admitting: Cardiology

## 2018-11-27 DIAGNOSIS — I255 Ischemic cardiomyopathy: Secondary | ICD-10-CM | POA: Diagnosis not present

## 2018-11-27 DIAGNOSIS — M797 Fibromyalgia: Secondary | ICD-10-CM | POA: Diagnosis not present

## 2018-11-27 DIAGNOSIS — E785 Hyperlipidemia, unspecified: Secondary | ICD-10-CM | POA: Diagnosis not present

## 2018-11-27 DIAGNOSIS — I200 Unstable angina: Secondary | ICD-10-CM | POA: Diagnosis not present

## 2018-11-27 DIAGNOSIS — R55 Syncope and collapse: Secondary | ICD-10-CM

## 2018-11-27 DIAGNOSIS — I25110 Atherosclerotic heart disease of native coronary artery with unstable angina pectoris: Secondary | ICD-10-CM | POA: Diagnosis not present

## 2018-11-27 LAB — BASIC METABOLIC PANEL
Anion gap: 8 (ref 5–15)
BUN: 15 mg/dL (ref 8–23)
CO2: 21 mmol/L — ABNORMAL LOW (ref 22–32)
Calcium: 8.7 mg/dL — ABNORMAL LOW (ref 8.9–10.3)
Chloride: 111 mmol/L (ref 98–111)
Creatinine, Ser: 1.01 mg/dL — ABNORMAL HIGH (ref 0.44–1.00)
GFR calc Af Amer: >60 mL/min (ref >60)
GFR calc non Af Amer: 56 mL/min — ABNORMAL LOW (ref >60)
Glucose, Bld: 107 mg/dL — ABNORMAL HIGH (ref 70–99)
Potassium: 3.9 mmol/L (ref 3.5–5.1)
Sodium: 140 mmol/L (ref 135–145)

## 2018-11-27 LAB — CBC
HCT: 35.5 % — ABNORMAL LOW (ref 36.0–46.0)
Hemoglobin: 12 g/dL (ref 12.0–15.0)
MCH: 31.5 pg (ref 26.0–34.0)
MCHC: 33.8 g/dL (ref 30.0–36.0)
MCV: 93.2 fL (ref 80.0–100.0)
Platelets: 241 10*3/uL (ref 150–400)
RBC: 3.81 MIL/uL — ABNORMAL LOW (ref 3.87–5.11)
RDW: 14.6 % (ref 11.5–15.5)
WBC: 6.6 10*3/uL (ref 4.0–10.5)
nRBC: 0 % (ref 0.0–0.2)

## 2018-11-27 NOTE — Progress Notes (Signed)
Discharge instruction reviewed with pt. Pt has no questions at this time. Pt dose not have a computer, iphone or my chart. Pt also dose not have a thermometer. Pt said she is trying to get one. Pt said she has not been around anyone who has had [place], since the beginning of March. MD aware of all the above. Pt states she is ready for d/c. She will call to schedule an appointment with her PCP. Husband is coming to pick up pt. IV D/C.

## 2018-11-27 NOTE — Progress Notes (Signed)
Progress Note  Patient Name: Donna Bailey Date of Encounter: 11/27/2018  Primary Cardiologist: Ena Dawley, MD   Subjective   Donna Bailey is a 73 year old female with a history of coronary artery disease.  She is status post DES to the proximal left anterior descending artery.  She has some residual disease that was treated medically.  This was complicated by a radial artery dissection/hematoma.  Left heart catheterization performed yesterday shows nonobstructive coronary artery disease.  FFR was performed of the circumflex and diagonal which was 0.94.  The patient continues to complain of shoulder pain.  She also says that she feels like she Donna Bailey pass out in the mornings after standing up for approximately 5 minutes.  She has to sit down when this occurs.  Is not associated with chest pain.  She does say that her blood pressure.  Inpatient Medications    Scheduled Meds: . aspirin  324 mg Oral Once  . aspirin  81 mg Oral Daily  . atorvastatin  10 mg Oral Daily  . carvedilol  6.25 mg Oral BID WC  . felodipine  10 mg Oral Daily  . pantoprazole  80 mg Oral Q1200  . ranolazine  500 mg Oral BID  . sodium chloride flush  3 mL Intravenous Q12H  . sodium chloride flush  3 mL Intravenous Q12H  . ticagrelor  90 mg Oral BID  . zolpidem  5 mg Oral QHS   Continuous Infusions: . sodium chloride    . sodium chloride     PRN Meds: sodium chloride, sodium chloride, diphenhydrAMINE, guaiFENesin-codeine, hydrocortisone cream, nitroGLYCERIN, ondansetron (ZOFRAN) IV, ondansetron, sodium chloride flush, sodium chloride flush, zolpidem   Vital Signs    Vitals:   11/26/18 1726 11/26/18 2122 11/27/18 0418 11/27/18 0742  BP: (!) 144/67 124/63 (!) 121/54 111/81  Pulse: 66 73 70   Resp: 18 18 15  (!) 22  Temp: 98.1 F (36.7 C) 98.1 F (36.7 C) 98 F (36.7 C)   TempSrc: Oral Oral Oral   SpO2: 98% 98% 92%   Weight:   85.5 kg   Height:        Intake/Output Summary (Last 24 hours) at  11/27/2018 0745 Last data filed at 11/26/2018 2230 Gross per 24 hour  Intake 100 ml  Output 750 ml  Net -650 ml   Last 3 Weights 11/27/2018 11/26/2018 11/25/2018  Weight (lbs) 188 lb 8 oz 186 lb 11.7 oz 186 lb 11.7 oz  Weight (kg) 85.503 kg 84.7 kg 84.7 kg      Telemetry    Sinus rhythm- Personally Reviewed  ECG    Sinus rhythm, rate 92- Personally Reviewed  Physical Exam   GEN: Well nourished, well developed, in no acute distress  HEENT: normal  Neck: no JVD, carotid bruits, or masses Cardiac: RRR; no murmurs, rubs, or gallops,no edema  Respiratory:  clear to auscultation bilaterally, normal work of breathing GI: soft, nontender, nondistended, + BS MS: no deformity or atrophy  Skin: warm and dry Neuro:  Strength and sensation are intact Psych: euthymic mood, full affect   Labs    Chemistry Recent Labs  Lab 11/25/18 1500 11/26/18 0821 11/27/18 0337  NA 141 141 140  K 4.3 3.8 3.9  CL 107 112* 111  CO2 22 19* 21*  GLUCOSE 93 96 107*  BUN 15 17 15   CREATININE 1.09* 1.05* 1.01*  CALCIUM 9.3 8.6* 8.7*  PROT 7.1  --   --   ALBUMIN 3.8  --   --  AST 29  --   --   ALT 27  --   --   ALKPHOS 79  --   --   BILITOT 0.6  --   --   GFRNONAA 51* 53* 56*  GFRAA 59* >60 >60  ANIONGAP 12 10 8      Hematology Recent Labs  Lab 11/25/18 1500 11/26/18 0821 11/27/18 0337  WBC 7.4 6.5 6.6  RBC 4.79 3.87 3.81*  HGB 14.7 12.1 12.0  HCT 45.1 36.0 35.5*  MCV 94.2 93.0 93.2  MCH 30.7 31.3 31.5  MCHC 32.6 33.6 33.8  RDW 14.6 14.5 14.6  PLT 320 257 241    Cardiac Enzymes Recent Labs  Lab 11/25/18 1808 11/25/18 2300 11/26/18 0239 11/26/18 0821  TROPONINI <0.03 <0.03 <0.03 <0.03   No results for input(s): TROPIPOC in the last 168 hours.   BNPNo results for input(s): BNP, PROBNP in the last 168 hours.   DDimer No results for input(s): DDIMER in the last 168 hours.   Radiology    Dg Chest Port 1 View  Result Date: 11/25/2018 CLINICAL DATA:  Chest pain EXAM:  PORTABLE CHEST 1 VIEW COMPARISON:  April 25, 2018. FINDINGS: There is no appreciable edema or consolidation. There is slight scarring in the right mid lung. Heart size and pulmonary vascularity are normal. No adenopathy. No pneumothorax. No bone lesions. IMPRESSION: Mild scarring right mid lung. No edema or consolidation. Stable cardiac silhouette. Electronically Signed   By: Lowella Grip III M.D.   On: 11/25/2018 14:19    Cardiac Studies   LHC 11/27/18  1st Diag lesion is 60% stenosed.  Lat 1st Diag lesion is 50% stenosed.  Ost Cx to Prox Cx lesion is 60% stenosed.  Mid RCA lesion is 40% stenosed.  Previously placed Prox LAD to Mid LAD stent (unknown type) is widely patent.  The left ventricular systolic function is normal.  LV end diastolic pressure is normal.  The left ventricular ejection fraction is greater than 65% by visual estimate.  There is no mitral valve regurgitation.   1. Patent stent mid LAD with no restenosis 2. Moderate non-obstructive disease in the ostial Circumflex and mid Diagonal, unchanged angiographically from last cath in June 2019. FFR of the Circumflex suggested the stenosis is not flow limiting (FFR 0.94) 3. Mild non-obstructive disease in the RCA 4. Normal LV systolic function   Patient Profile     73 y.o. female with cad   Assessment & Plan    1.  Coronary artery disease/unstable angina: Left heart catheterization showed moderate nonobstructive coronary artery disease with a negative FFR.  She continues to have shoulder pain, though I do not think that this is coronary related.  We Donna Bailey continue her current medications without changes.    2.  Essential hypertension: Currently well controlled.  Continue current medications.  3.  Hyperlipidemia: Most recent LDL 49.  Continue statins.  4.  Near syncope: Patient has not actually passed out.  We Donna Bailey walk her around the halls today.  If she does not have reproducible symptoms, Donna Bailey likely  go home with a cardiac monitor for follow-up with Dr. Meda Coffee in clinic.  For questions or updates, please contact Donna Bailey Please consult [email or web address] for contact info under        Signed, Janitza Revuelta Meredith Leeds, MD  11/27/2018, 7:45 AM

## 2018-11-27 NOTE — Care Management (Signed)
Spoke with patient at length (greater than 30 minutes) regarding observation status and changing from inpatient to observation.  Pt is upset that she will be charged for the medications she took by mouth last night.  Instructed patient to discuss with billing office and with her [facility] Medicare contact.

## 2018-11-27 NOTE — Progress Notes (Signed)
Pt ambulated the halls this morning twice.SBP after walking was in the 140's. Pt stated she did not feel SOB or dizzy. However, pt stated she did not walk long enough to reproduce symptoms. Pt was offered to walk again, however, pt said she was fine with going home and then getting setup for a heart monitor out pt.

## 2018-11-27 NOTE — Progress Notes (Signed)
Pt's right groin bruised.No hematoma noted. Pt denies any pain the leg.

## 2018-11-29 ENCOUNTER — Encounter (HOSPITAL_COMMUNITY): Payer: Self-pay | Admitting: Cardiovascular Disease

## 2018-11-30 ENCOUNTER — Telehealth (INDEPENDENT_AMBULATORY_CARE_PROVIDER_SITE_OTHER): Payer: Self-pay | Admitting: Family Medicine

## 2018-11-30 ENCOUNTER — Telehealth: Payer: Self-pay | Admitting: Radiology

## 2018-11-30 NOTE — Telephone Encounter (Signed)
Enrolled patient for a 30 day [employer] Event Monitor to be mailed due to covid-19. I went over brief instructions with the patient and she knows to expect the monitor to arrive in 2-3 days.

## 2018-11-30 NOTE — Telephone Encounter (Signed)
No answer cell #. Texted mychart app link to cell #. Pt was assigned [place] Monitoring via mobile app by the hospital. Please transfer to me if pt calls back regarding [place] [phone].

## 2018-12-03 ENCOUNTER — Ambulatory Visit (INDEPENDENT_AMBULATORY_CARE_PROVIDER_SITE_OTHER): Payer: Medicare Other

## 2018-12-03 DIAGNOSIS — R55 Syncope and collapse: Secondary | ICD-10-CM | POA: Diagnosis not present

## 2018-12-07 NOTE — Telephone Encounter (Signed)
LM for pt regarding MyChart Mobile Appt and MyChart COVID19 Home Monitoring questionnaire. Generated MyChart code and texted link to pt.

## 2018-12-07 NOTE — Telephone Encounter (Signed)
Pt called noting she only has a flip phone. She does not have smartphone, tablet, or computer. Pt had f/u with PCP yesterday who added a fluid pill. No other changes.

## 2018-12-23 DIAGNOSIS — S8001XA Contusion of right knee, initial encounter: Secondary | ICD-10-CM | POA: Insufficient documentation
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
## 2018-12-29 ENCOUNTER — Telehealth: Payer: Self-pay | Admitting: *Deleted

## 2018-12-29 ENCOUNTER — Telehealth (INDEPENDENT_AMBULATORY_CARE_PROVIDER_SITE_OTHER): Payer: Medicare Other | Admitting: Physician Assistant

## 2018-12-29 ENCOUNTER — Other Ambulatory Visit: Payer: Self-pay

## 2018-12-29 ENCOUNTER — Encounter: Payer: Self-pay | Admitting: Physician Assistant

## 2018-12-29 VITALS — Ht 66.0 in | Wt 190.0 lb

## 2018-12-29 DIAGNOSIS — I25118 Atherosclerotic heart disease of native coronary artery with other forms of angina pectoris: Secondary | ICD-10-CM | POA: Diagnosis not present

## 2018-12-29 DIAGNOSIS — E785 Hyperlipidemia, unspecified: Secondary | ICD-10-CM

## 2018-12-29 DIAGNOSIS — R55 Syncope and collapse: Secondary | ICD-10-CM

## 2018-12-29 DIAGNOSIS — Z7189 Other specified counseling: Secondary | ICD-10-CM

## 2018-12-29 DIAGNOSIS — I10 Essential (primary) hypertension: Secondary | ICD-10-CM

## 2018-12-29 NOTE — Patient Instructions (Addendum)
Medication Instructions:   Stop  Taking Ranexa (Ranolazine). If more chest pain or shoulder pain while off this medication,  should resume it and let provider know  If you need a refill on your cardiac medications before your next appointment, please call your pharmacy.   Lab work:  NONE ORDERED  TODAY    If you have labs (blood work) drawn today and your tests are completely normal, you will receive your results only by: Marland Kitchen MyChart Message (if you have MyChart) OR . A paper copy in the mail If you have any lab test that is abnormal or we need to change your treatment, we will call you to review the results.  Testing/Procedures:NONE ORDERED  TODAY  Follow-Up: At [facility], you and your health needs are our priority.  As part of our continuing mission to provide you with exceptional heart care, we have created designated Provider Care Teams.  These Care Teams include your primary Cardiologist (physician) and Advanced Practice Providers (APPs -  Physician Assistants and Nurse Practitioners) who all work together to provide you with the care you need, when you need it. You will need a follow up appointment in:  3 months.   You may see Ena Dawley, MD  or one of the following Advanced Practice Providers on your designated Care Team: Richardson Dopp, PA-C   Any Other Special Instructions Will Be Listed Below (If Applicable). Check  BP 1 time a day for a week and record those.  also check BP whenever feel dizzy.  should call in or mail the BP readings after 1 week

## 2018-12-29 NOTE — Telephone Encounter (Signed)

## 2018-12-31 ENCOUNTER — Telehealth: Payer: Self-pay | Admitting: Physician Assistant

## 2018-12-31 ENCOUNTER — Telehealth: Payer: Self-pay | Admitting: *Deleted

## 2018-12-31 NOTE — Telephone Encounter (Signed)
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 Do you think we should keep her on Brilinta 60 mg twice daily + ASA 81 mg once daily or should we stop her Brilinta and keep her on ASA only? Richardson Dopp, PA-C    12/31/2018 3:36 PM

## 2018-12-31 NOTE — Telephone Encounter (Signed)
SPOKE WITH PT FOR RECOMMENDATIONS  FOR  STOPPING BIRLINTA AND STARTING PLAVIX AND CONTINUE TAKING ASPIRIN DAILY   PT WAS REASSURED THE PHARM-D WAS ASKED ABOUT THE CORN FILLER ALLERGY THAT CAUSES  HER ITCHING REAL BAD THAT SHE SHOULD BE ABLE TO TOLERATE IT.  SHE STATED SHE'S TRIED IT ALREADY AND ITS DOESN'T  WORK WELL FOR HER  AND HAVE DONE RESEARCH THAT THERES CERTAIN MANU FACTORS THAN TAKE THE CORN FILLER OUT BUT IS REALLY EXPENSIVE.  SHE FEELS THAT BIRLINTA SHE CAN TOLERATE AND SHE'LL STILL TAKE HER ASPIRIN EVERYDAY UNTIL WE CAN FIND A SPECIAL TYPE OF PLAVIX FOR HER TO TAKE. SHE CONSTANTLY STRESSED SHE DONT WANT TO TAKE A RISK.

## 2018-12-31 NOTE — Telephone Encounter (Signed)
Please call the patient. I reviewed her case with Dr. Meda Coffee. She felt the patient could come off of Brilinta now, but wanted her to start on Plavix to replace the [place]. I did check with our pharmacist regarding her corn filler allergy.  She felt that Ms. Lyon should be able to tolerate the Plavix. PLAN:  1. Finish current bottle of Brilinta. 2. Then, start Plavix 75 mg once daily (please send Rx to her pharmacy). 3. Continue ASA 81 mg once daily  Richardson Dopp, PA-C    12/31/2018 1:03 PM

## 2019-01-04 ENCOUNTER — Other Ambulatory Visit: Payer: Self-pay

## 2019-01-06 ENCOUNTER — Telehealth: Payer: Self-pay

## 2019-01-06 MED ORDER — TICAGRELOR 60 MG PO TABS: 60.0000 mg | ORAL_TABLET | Freq: Two times a day (BID) | ORAL | 3 refills | Status: DC

## 2019-01-06 NOTE — Telephone Encounter (Signed)
See other telephone encounter (the following is copied from that note):  Donna Bailey, [place]   12/31/18 2:40 PM  Note    SPOKE WITH PT FOR RECOMMENDATIONS  FOR  STOPPING BIRLINTA AND STARTING PLAVIX AND CONTINUE TAKING ASPIRIN DAILY   PT WAS REASSURED THE PHARM-D WAS ASKED ABOUT THE CORN FILLER ALLERGY THAT CAUSES  HER ITCHING REAL BAD THAT SHE SHOULD BE ABLE TO TOLERATE IT.  SHE STATED SHE'S TRIED IT ALREADY AND ITS DOESN'T  WORK WELL FOR HER  AND HAVE DONE RESEARCH THAT THERES CERTAIN MANU FACTORS THAN TAKE THE CORN FILLER OUT BUT IS REALLY EXPENSIVE.  SHE FEELS THAT BIRLINTA SHE CAN TOLERATE AND SHE'LL STILL TAKE HER ASPIRIN EVERYDAY UNTIL WE CAN FIND A SPECIAL TYPE OF PLAVIX FOR HER TO TAKE. SHE CONSTANTLY STRESSED SHE DONT WANT TO TAKE A RISK.

## 2019-01-06 NOTE — Telephone Encounter (Signed)
-----   Message from Cheryln Manly, NP sent at 01/06/2019  1:41 PM EDT ----- Please inform patient her monitor showed no arrhythmias. Thx

## 2019-01-06 NOTE — Telephone Encounter (Signed)
I reviewed with Dr. Meda Coffee. Since the patient is going to remain on Brilinta and she is more than 1 year out from her heart attack, we can reduce the dose of Brilinta. PLAN:  1. Finish current bottle of Brilinta. 2. Then start Brilinta 60 mg twice daily (I have sent a Rx to [facility] on [address]) Richardson Dopp, PA-C    01/06/2019 8:42 AM

## 2019-01-06 NOTE — Telephone Encounter (Signed)
Notes recorded by Frederik Schmidt, RN on 01/06/2019 at 2:10 PM EDT The patient has been notified of the result and verbalized understanding. All questions (if any) were answered. Frederik Schmidt, RN 01/06/2019 2:10 PM

## 2019-01-06 NOTE — Addendum Note (Signed)
Addended byKathlen Mody, Nicki Reaper T on: 01/06/2019 08:44 AM   Modules accepted: Orders

## 2019-01-06 NOTE — Telephone Encounter (Signed)
Spoke with patient and patient aware of medication changes

## 2019-03-05 ENCOUNTER — Other Ambulatory Visit: Payer: Self-pay | Admitting: Internal Medicine

## 2019-03-07 NOTE — Telephone Encounter (Signed)
Is this appropriate for refill ? 

## 2019-03-07 NOTE — Telephone Encounter (Signed)
Cough syrup refill e-sent 

## 2019-03-15 ENCOUNTER — Ambulatory Visit: Payer: Medicare Other | Admitting: Internal Medicine

## 2019-04-01 DIAGNOSIS — K1379 Other lesions of oral mucosa: Secondary | ICD-10-CM | POA: Insufficient documentation

## 2019-04-05 ENCOUNTER — Other Ambulatory Visit: Payer: Self-pay | Admitting: Internal Medicine

## 2019-04-05 NOTE — Telephone Encounter (Signed)
Appropriate for refill

## 2019-04-07 NOTE — Telephone Encounter (Signed)
Refill request was sent to [facility] but the last refill was sent by [facility] on 8.3.2020 Guaifenesin-Codiene cough syrup  #157mL with 0 additional refills.  Will route to Dr [name] for authorization on refills.

## 2019-04-07 NOTE — Telephone Encounter (Signed)
Cough syrup refill e-sent 

## 2019-04-25 ENCOUNTER — Emergency Department (HOSPITAL_COMMUNITY): Payer: Medicare Other

## 2019-04-25 ENCOUNTER — Inpatient Hospital Stay (HOSPITAL_COMMUNITY)
Admission: EM | Admit: 2019-04-25 | Discharge: 2019-04-27 | DRG: 281 | Disposition: A | Discharge status: Home / Self Care | Payer: Medicare Other | Attending: Internal Medicine | Admitting: Internal Medicine

## 2019-04-25 ENCOUNTER — Other Ambulatory Visit: Payer: Self-pay

## 2019-04-25 ENCOUNTER — Telehealth: Payer: Self-pay | Admitting: Cardiology

## 2019-04-25 DIAGNOSIS — I129 Hypertensive chronic kidney disease with stage 1 through stage 4 chronic kidney disease, or unspecified chronic kidney disease: Secondary | ICD-10-CM | POA: Diagnosis present

## 2019-04-25 DIAGNOSIS — N182 Chronic kidney disease, stage 2 (mild): Secondary | ICD-10-CM | POA: Diagnosis present

## 2019-04-25 DIAGNOSIS — I959 Hypotension, unspecified: Secondary | ICD-10-CM | POA: Diagnosis present

## 2019-04-25 DIAGNOSIS — Z806 Family history of leukemia: Secondary | ICD-10-CM

## 2019-04-25 DIAGNOSIS — E669 Obesity, unspecified: Secondary | ICD-10-CM | POA: Diagnosis present

## 2019-04-25 DIAGNOSIS — I21A1 Myocardial infarction type 2: Secondary | ICD-10-CM | POA: Diagnosis present

## 2019-04-25 DIAGNOSIS — I214 Non-ST elevation (NSTEMI) myocardial infarction: Secondary | ICD-10-CM

## 2019-04-25 DIAGNOSIS — Z91040 Latex allergy status: Secondary | ICD-10-CM

## 2019-04-25 DIAGNOSIS — J45909 Unspecified asthma, uncomplicated: Secondary | ICD-10-CM | POA: Diagnosis present

## 2019-04-25 DIAGNOSIS — I25110 Atherosclerotic heart disease of native coronary artery with unstable angina pectoris: Secondary | ICD-10-CM | POA: Diagnosis not present

## 2019-04-25 DIAGNOSIS — I252 Old myocardial infarction: Secondary | ICD-10-CM

## 2019-04-25 DIAGNOSIS — I255 Ischemic cardiomyopathy: Secondary | ICD-10-CM | POA: Diagnosis present

## 2019-04-25 DIAGNOSIS — I200 Unstable angina: Secondary | ICD-10-CM | POA: Diagnosis present

## 2019-04-25 DIAGNOSIS — Z9114 Patient's other noncompliance with medication regimen: Secondary | ICD-10-CM

## 2019-04-25 DIAGNOSIS — E785 Hyperlipidemia, unspecified: Secondary | ICD-10-CM | POA: Diagnosis present

## 2019-04-25 DIAGNOSIS — Z8249 Family history of ischemic heart disease and other diseases of the circulatory system: Secondary | ICD-10-CM

## 2019-04-25 DIAGNOSIS — K2270 Barrett's esophagus without dysplasia: Secondary | ICD-10-CM | POA: Diagnosis present

## 2019-04-25 DIAGNOSIS — I2510 Atherosclerotic heart disease of native coronary artery without angina pectoris: Secondary | ICD-10-CM | POA: Diagnosis present

## 2019-04-25 DIAGNOSIS — R112 Nausea with vomiting, unspecified: Secondary | ICD-10-CM | POA: Diagnosis present

## 2019-04-25 DIAGNOSIS — Z7902 Long term (current) use of antithrombotics/antiplatelets: Secondary | ICD-10-CM

## 2019-04-25 DIAGNOSIS — M1990 Unspecified osteoarthritis, unspecified site: Secondary | ICD-10-CM | POA: Diagnosis present

## 2019-04-25 DIAGNOSIS — R197 Diarrhea, unspecified: Secondary | ICD-10-CM | POA: Diagnosis present

## 2019-04-25 DIAGNOSIS — Z803 Family history of malignant neoplasm of breast: Secondary | ICD-10-CM

## 2019-04-25 DIAGNOSIS — Z880 Allergy status to penicillin: Secondary | ICD-10-CM

## 2019-04-25 DIAGNOSIS — Z20828 Contact with and (suspected) exposure to other viral communicable diseases: Secondary | ICD-10-CM | POA: Diagnosis present

## 2019-04-25 DIAGNOSIS — Z7982 Long term (current) use of aspirin: Secondary | ICD-10-CM

## 2019-04-25 DIAGNOSIS — Z831 Family history of other infectious and parasitic diseases: Secondary | ICD-10-CM

## 2019-04-25 DIAGNOSIS — K219 Gastro-esophageal reflux disease without esophagitis: Secondary | ICD-10-CM | POA: Diagnosis present

## 2019-04-25 DIAGNOSIS — E7800 Pure hypercholesterolemia, unspecified: Secondary | ICD-10-CM | POA: Diagnosis not present

## 2019-04-25 DIAGNOSIS — Z888 Allergy status to other drugs, medicaments and biological substances status: Secondary | ICD-10-CM

## 2019-04-25 DIAGNOSIS — Z882 Allergy status to sulfonamides status: Secondary | ICD-10-CM

## 2019-04-25 DIAGNOSIS — Z6831 Body mass index (BMI) 31.0-31.9, adult: Secondary | ICD-10-CM

## 2019-04-25 DIAGNOSIS — Z8371 Family history of colonic polyps: Secondary | ICD-10-CM

## 2019-04-25 DIAGNOSIS — I249 Acute ischemic heart disease, unspecified: Secondary | ICD-10-CM | POA: Diagnosis present

## 2019-04-25 DIAGNOSIS — M79601 Pain in right arm: Secondary | ICD-10-CM | POA: Insufficient documentation

## 2019-04-25 DIAGNOSIS — Z881 Allergy status to other antibiotic agents status: Secondary | ICD-10-CM

## 2019-04-25 DIAGNOSIS — E872 Acidosis: Secondary | ICD-10-CM | POA: Diagnosis present

## 2019-04-25 DIAGNOSIS — G629 Polyneuropathy, unspecified: Secondary | ICD-10-CM | POA: Diagnosis present

## 2019-04-25 DIAGNOSIS — Z955 Presence of coronary angioplasty implant and graft: Secondary | ICD-10-CM

## 2019-04-25 DIAGNOSIS — F419 Anxiety disorder, unspecified: Secondary | ICD-10-CM | POA: Diagnosis present

## 2019-04-25 DIAGNOSIS — Z79899 Other long term (current) drug therapy: Secondary | ICD-10-CM

## 2019-04-25 LAB — BASIC METABOLIC PANEL
Anion gap: 10 (ref 5–15)
BUN: 9 mg/dL (ref 8–23)
CO2: 21 mmol/L — ABNORMAL LOW (ref 22–32)
Calcium: 9.1 mg/dL (ref 8.9–10.3)
Chloride: 109 mmol/L (ref 98–111)
Creatinine, Ser: 1 mg/dL (ref 0.44–1.00)
GFR calc Af Amer: >60 mL/min (ref >60)
GFR calc non Af Amer: 56 mL/min — ABNORMAL LOW (ref >60)
Glucose, Bld: 103 mg/dL — ABNORMAL HIGH (ref 70–99)
Potassium: 3.5 mmol/L (ref 3.5–5.1)
Sodium: 140 mmol/L (ref 135–145)

## 2019-04-25 LAB — CBC
HCT: 42.3 % (ref 36.0–46.0)
Hemoglobin: 14.2 g/dL (ref 12.0–15.0)
MCH: 29.8 pg (ref 26.0–34.0)
MCHC: 33.6 g/dL (ref 30.0–36.0)
MCV: 88.9 fL (ref 80.0–100.0)
Platelets: 290 10*3/uL (ref 150–400)
RBC: 4.76 MIL/uL (ref 3.87–5.11)
RDW: 15.6 % — ABNORMAL HIGH (ref 11.5–15.5)
WBC: 8.2 10*3/uL (ref 4.0–10.5)
nRBC: 0 % (ref 0.0–0.2)

## 2019-04-25 LAB — LACTIC ACID, PLASMA
Lactic Acid, Venous: 1.5 mmol/L (ref 0.5–1.9)
Lactic Acid, Venous: 1.7 mmol/L (ref 0.5–1.9)

## 2019-04-25 LAB — TROPONIN I (HIGH SENSITIVITY)
Troponin I (High Sensitivity): 269 ng/L (ref <18)
Troponin I (High Sensitivity): 793 ng/L (ref <18)
Troponin I (High Sensitivity): 3563 ng/L (ref <18)

## 2019-04-25 LAB — URINALYSIS, ROUTINE W REFLEX MICROSCOPIC
Bilirubin Urine: NEGATIVE
Glucose, UA: NEGATIVE mg/dL
Hgb urine dipstick: NEGATIVE
Ketones, ur: 5 mg/dL — AB
Leukocytes,Ua: NEGATIVE
Nitrite: NEGATIVE
Protein, ur: NEGATIVE mg/dL
Specific Gravity, Urine: 1.005 (ref 1.005–1.030)
pH: 7 (ref 5.0–8.0)

## 2019-04-25 LAB — HEPATIC FUNCTION PANEL
ALT: 20 U/L (ref 0–44)
AST: 30 U/L (ref 15–41)
Albumin: 3 g/dL — ABNORMAL LOW (ref 3.5–5.0)
Alkaline Phosphatase: 51 U/L (ref 38–126)
Bilirubin, Direct: <0.1 mg/dL (ref 0.0–0.2)
Total Bilirubin: 0.6 mg/dL (ref 0.3–1.2)
Total Protein: 5.7 g/dL — ABNORMAL LOW (ref 6.5–8.1)

## 2019-04-25 LAB — LIPASE, BLOOD: Lipase: 23 U/L (ref 11–51)

## 2019-04-25 LAB — MAGNESIUM: Magnesium: 1.7 mg/dL (ref 1.7–2.4)

## 2019-04-25 MED ORDER — SODIUM CHLORIDE 0.9 % IV BOLUS
1000.0000 mL | Freq: Once | INTRAVENOUS | Status: AC
  Administered 2019-04-25: 1000 mL via INTRAVENOUS

## 2019-04-25 MED ORDER — CARVEDILOL 3.125 MG PO TABS
3.1250 mg | ORAL_TABLET | Freq: Two times a day (BID) | ORAL | Status: DC
  Filled 2019-04-25 (×3): qty 1

## 2019-04-25 MED ORDER — ASPIRIN 81 MG PO CHEW
324.0000 mg | CHEWABLE_TABLET | Freq: Once | ORAL | Status: DC
  Filled 2019-04-25: qty 4

## 2019-04-25 MED ORDER — RANOLAZINE ER 500 MG PO TB12
500.0000 mg | ORAL_TABLET | Freq: Two times a day (BID) | ORAL | Status: DC
  Filled 2019-04-25 (×3): qty 1

## 2019-04-25 MED ORDER — HEPARIN BOLUS VIA INFUSION
4000.0000 [IU] | Freq: Once | INTRAVENOUS | Status: AC
  Administered 2019-04-25: 16:00:00 4000 [IU] via INTRAVENOUS
  Filled 2019-04-25: qty 4000

## 2019-04-25 MED ORDER — SODIUM CHLORIDE 0.9 % IV BOLUS
1000.0000 mL | Freq: Once | INTRAVENOUS | Status: AC
  Administered 2019-04-25: 14:00:00 1000 mL via INTRAVENOUS

## 2019-04-25 MED ORDER — SODIUM CHLORIDE 0.9% FLUSH: 3.0000 mL | Freq: Once | INTRAVENOUS | Status: DC

## 2019-04-25 MED ORDER — ONDANSETRON HCL 4 MG/2ML IJ SOLN
4.0000 mg | Freq: Once | INTRAMUSCULAR | Status: DC | PRN
  Filled 2019-04-25: qty 2

## 2019-04-25 MED ORDER — HEPARIN (PORCINE) 25000 UT/250ML-% IV SOLN
1100.0000 [IU]/h | INTRAVENOUS | Status: DC
  Administered 2019-04-25: 950 [IU]/h via INTRAVENOUS
  Filled 2019-04-25: qty 250

## 2019-04-25 NOTE — ED Notes (Signed)
Pt escorted to the bathroom to have bowel movement. She is cognitive enough to pull the call light. Sort staff aware of patients needs.

## 2019-04-25 NOTE — ED Triage Notes (Signed)
Pt sent by cardiologist. Pt reports pain to back radiating to L arm. Pt feels near-syncopal, BP 70/54.

## 2019-04-25 NOTE — ED Notes (Signed)
Pt ambulatory to bathroom without any problems 

## 2019-04-25 NOTE — Progress Notes (Signed)
ANTICOAGULATION CONSULT NOTE - Initial Consult  Pharmacy Consult for Heparin Indication: chest pain/ACS / STEMI  Allergies  Allergen Reactions  . Sulfa Antibiotics Anaphylaxis  . Sulfonamide Derivatives Anaphylaxis  . Penicillins Rash    Underarms (both) Has patient had a PCN reaction causing immediate rash, facial/tongue/throat swelling, SOB or lightheadedness with hypotension: YES Has patient had a PCN reaction causing severe rash involving mucus membranes or skin necrosis: NO Has patient had a PCN reaction that required hospitalization NO Has patient had a PCN reaction occurring within the last 10 years: NO If all of the above answers are "NO", then may proceed with Cephalosporin use.  Delma Freeze [Fexofenadine]     Heart race  . Avelox [Moxifloxacin]     Doesn't remember  . Caffeine     Heart race  . Cephalexin Other (See Comments)    unknown  . Ciprofloxacin Other (See Comments)    unknown  . Corn-Containing Products Itching and Other (See Comments)    Can not walk if have a lot of it.  . Crestor [Rosuvastatin] Other (See Comments)    Lost all muscle mobility   . Doxycycline Diarrhea and Nausea And Vomiting  . Formoterol Fumarate Other (See Comments)    unknown  . Gold-Containing Drug Products     Skin tingle   . Latex     Gets red   . Levofloxacin     REACTION: HEART RACING  . Macrodantin [Nitrofurantoin Macrocrystal] Itching  . Neomycin Sulfate [Neomycin]     Doesn't remember - allergist said not to use it because it could add more allergies   . Ofloxacin Itching  . Other     Corn fillers, corn by-products - causes severe itching Polyester-"pins sticking in her skin" Gold  . Valsartan Other (See Comments)    Pt reports this med causes her to feel sluggish and she feels heaviness on her shoulders.  . Adhesive [Tape] Rash  . Ibuprofen Rash  . Mold Extract [Trichophyton Mentagrophyte] Other (See Comments)    Bumps on back, stops up sinuses.  . Nickel Rash  .  Tylenol [Acetaminophen] Rash    On face    Patient Measurements: Height: 5\' 6"  (167.6 cm) Weight: 193 lb (87.5 kg) IBW/kg (Calculated) : 59.3 Heparin Dosing Weight: 78.2  Vital Signs: Temp: 97.9 F (36.6 C) (09/21 1256) Temp Source: Oral (09/21 1256) BP: 94/44 (09/21 1400) Pulse Rate: 68 (09/21 1400)  Labs: Recent Labs    04/25/19 1251  HGB 14.2  HCT 42.3  PLT 290  CREATININE 1.00  TROPONINIHS 269*    Estimated Creatinine Clearance: 56.7 mL/min (by C-G formula based on SCr of 1 mg/dL).   Medical History: Past Medical History:  Diagnosis Date  . Allergic rhinitis, cause unspecified   . Anemia   . Aneurysm of right conjunctiva    right eye   . Anxiety   . Asthma   . Barrett's esophagus   . CAD in native artery    a. 11/2017: STEMI s/p DES to prox-mid LAD; LCx stenosis managed medically. Case complicated by R forearm hematoma. // LHC 6/19: LAD stent patent, severe dz in branches of D1 (not amenable to PCI), mid RCA 40, LCx ostial 60 (neg by FFR) >> med Rx  . CKD (chronic kidney disease), stage II   . Constipation   . Cystocele   . Deviated nasal septum   . Diaphragmatic hernia without mention of obstruction or gangrene   . Esophageal reflux   . Fibromyalgia   .  H/O hiatal hernia   . Hypertension   . Insomnia, unspecified   . Ischemic cardiomyopathy    a. EF 40-45% by echo 11/2017. // Echo 5/19: No new wall motion abnormalities, EF 65, no pericardial effusion, normal aortic root  . Kidney stones    hx of pt see Dr. Risa Grill  . Migraine   . Myalgia and myositis, unspecified   . Nuclear stress tests    Cardiolite 2/18: no ischemia or scar, EF 78; Low Risk  . Osteoarthrosis, unspecified whether generalized or localized, unspecified site   . Peripheral neuropathy   . Pneumonia    hx of  . Pure hypercholesterolemia   . Rectocele   . Scoliosis (and kyphoscoliosis), idiopathic   . Statin intolerance   . Temporomandibular joint disorders, unspecified   . Wheat  allergy      Assessment: 73 year old female presents with back pain.  She states that this feels very similar to when she was here in April and had to have a heart catheterization.  She states this is equivalent to her cardiac pain.  Is in her bilateral posterior shoulders and radiates down her left arm.  Started this morning about 11 AM.  She took a nitroglycerin and came here.  Pain was previously 9, now is a 7. Patient with elevated troponin.  For [facility] patient was on aspirin and Brilinta at home.  Pharmacy consulted to dose heparin gtt for ACS/STEMI.   Goal of Therapy:  Heparin level 0.3-0.7 units/ml Monitor platelets by anticoagulation protocol: Yes   Plan:  Give 4000 units bolus x 1 Start heparin infusion at 950 units/hr Check anti-Xa level in 8 hours and daily while on heparin Continue to monitor H&H and platelets    Alanda Slim, PharmD, [facility] Clinical Pharmacist Please see AMION for all Pharmacists' Contact Phone Numbers 04/25/2019, 2:48 PM

## 2019-04-25 NOTE — ED Notes (Signed)
Pt st's she is unable to put a gown on because she is allergic to our gowns and sheets.  Pt st's she is also allergic to the room she is in and wishes to be moved up stairs.  I explained to pt that she is waiting for a room assignment.  Pt asked if she could be moved to the hall upstairs, I again explained to pt that she did not have a bed assignment at this time.  Pt requests to be moved back to room 17, explained to pt that another pt was now in that room.

## 2019-04-25 NOTE — Telephone Encounter (Signed)
New Message   Pt c/o of Chest Pain: STAT if CP now or developed within 24 hours  1. Are you having CP right now? no  2. Are you experiencing any other symptoms (ex. SOB, nausea, vomiting, sweating)? Early patient has sob, sweating and felt like she was going to vomit  3. How long have you been experiencing CP? Occurred this morning when she was trying to eat breakfast   4. Is your CP continuous or coming and going? Coming and going   5. Have you taken Nitroglycerin? Yes, as well as 4 81mg  of aspirin.   Patient also states that she has been fatigued along with some confusion. Please call.  ?
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
## 2019-04-25 NOTE — ED Notes (Signed)
Trop was drawn at 2234, next one needs drawn at [place]. thanks
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
## 2019-04-25 NOTE — ED Notes (Signed)
Date and time results received: 04/25/19 1427 (use smartphrase ".now" to insert current time)  Test: trop Critical Value: 269 Name of Provider Notified: goldston  Orders Received? Or Actions Taken?: .
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
## 2019-04-25 NOTE — Telephone Encounter (Signed)
I spoke with the patient who experienced 9/10 CP, SOB, arm aching and sweaty/nauseated feeling at 11 am this morning.  It has not relieved with NTG and 4 X ASA 81 mg.  I advised her to go to the ED for further evaluation.  She verbalized understanding.

## 2019-04-25 NOTE — ED Provider Notes (Signed)
Pt signed out to me by Dr. Regenia Skeeter. Awaiting cardiology recs in setting of NSTEMI.   NP Dorene Ar and Dr. Meda Coffee assessed pt and agreed w/ heparin, however recommended medicine admission due to V/D. Discussed w/ [facility] and pt admitted for further care. BP has improved during ED course.   Juris Gosnell, Wenda Overland, MD 04/25/19 1946

## 2019-04-25 NOTE — ED Notes (Signed)
Admitting MD paged ref. Troponin results

## 2019-04-26 ENCOUNTER — Encounter (HOSPITAL_COMMUNITY): Payer: Self-pay | Admitting: Cardiovascular Disease

## 2019-04-26 ENCOUNTER — Encounter (HOSPITAL_COMMUNITY)
Admission: EM | Disposition: A | Discharge status: Home / Self Care | Payer: Self-pay | Source: Home / Self Care | Attending: Internal Medicine

## 2019-04-26 DIAGNOSIS — I249 Acute ischemic heart disease, unspecified: Secondary | ICD-10-CM | POA: Diagnosis not present

## 2019-04-26 DIAGNOSIS — Z91040 Latex allergy status: Secondary | ICD-10-CM | POA: Diagnosis not present

## 2019-04-26 DIAGNOSIS — I200 Unstable angina: Secondary | ICD-10-CM | POA: Diagnosis not present

## 2019-04-26 DIAGNOSIS — Z6831 Body mass index (BMI) 31.0-31.9, adult: Secondary | ICD-10-CM | POA: Diagnosis not present

## 2019-04-26 DIAGNOSIS — I214 Non-ST elevation (NSTEMI) myocardial infarction: Secondary | ICD-10-CM | POA: Insufficient documentation

## 2019-04-26 DIAGNOSIS — Z882 Allergy status to sulfonamides status: Secondary | ICD-10-CM | POA: Diagnosis not present

## 2019-04-26 DIAGNOSIS — R112 Nausea with vomiting, unspecified: Secondary | ICD-10-CM | POA: Diagnosis present

## 2019-04-26 DIAGNOSIS — I959 Hypotension, unspecified: Secondary | ICD-10-CM | POA: Diagnosis present

## 2019-04-26 DIAGNOSIS — Z881 Allergy status to other antibiotic agents status: Secondary | ICD-10-CM | POA: Diagnosis not present

## 2019-04-26 DIAGNOSIS — I2510 Atherosclerotic heart disease of native coronary artery without angina pectoris: Secondary | ICD-10-CM | POA: Diagnosis present

## 2019-04-26 DIAGNOSIS — E872 Acidosis: Secondary | ICD-10-CM | POA: Diagnosis present

## 2019-04-26 DIAGNOSIS — E785 Hyperlipidemia, unspecified: Secondary | ICD-10-CM

## 2019-04-26 DIAGNOSIS — G629 Polyneuropathy, unspecified: Secondary | ICD-10-CM | POA: Diagnosis present

## 2019-04-26 DIAGNOSIS — E7800 Pure hypercholesterolemia, unspecified: Secondary | ICD-10-CM | POA: Diagnosis present

## 2019-04-26 DIAGNOSIS — F419 Anxiety disorder, unspecified: Secondary | ICD-10-CM | POA: Diagnosis present

## 2019-04-26 DIAGNOSIS — K219 Gastro-esophageal reflux disease without esophagitis: Secondary | ICD-10-CM | POA: Diagnosis present

## 2019-04-26 DIAGNOSIS — N182 Chronic kidney disease, stage 2 (mild): Secondary | ICD-10-CM | POA: Diagnosis present

## 2019-04-26 DIAGNOSIS — I21A1 Myocardial infarction type 2: Secondary | ICD-10-CM | POA: Diagnosis present

## 2019-04-26 DIAGNOSIS — I25110 Atherosclerotic heart disease of native coronary artery with unstable angina pectoris: Secondary | ICD-10-CM | POA: Diagnosis present

## 2019-04-26 DIAGNOSIS — I129 Hypertensive chronic kidney disease with stage 1 through stage 4 chronic kidney disease, or unspecified chronic kidney disease: Secondary | ICD-10-CM | POA: Diagnosis present

## 2019-04-26 DIAGNOSIS — R197 Diarrhea, unspecified: Secondary | ICD-10-CM | POA: Diagnosis present

## 2019-04-26 DIAGNOSIS — Z888 Allergy status to other drugs, medicaments and biological substances status: Secondary | ICD-10-CM | POA: Diagnosis not present

## 2019-04-26 DIAGNOSIS — E669 Obesity, unspecified: Secondary | ICD-10-CM | POA: Diagnosis present

## 2019-04-26 DIAGNOSIS — Z880 Allergy status to penicillin: Secondary | ICD-10-CM | POA: Diagnosis not present

## 2019-04-26 DIAGNOSIS — Z955 Presence of coronary angioplasty implant and graft: Secondary | ICD-10-CM | POA: Diagnosis not present

## 2019-04-26 DIAGNOSIS — I252 Old myocardial infarction: Secondary | ICD-10-CM | POA: Diagnosis not present

## 2019-04-26 DIAGNOSIS — Z20828 Contact with and (suspected) exposure to other viral communicable diseases: Secondary | ICD-10-CM | POA: Diagnosis present

## 2019-04-26 DIAGNOSIS — I255 Ischemic cardiomyopathy: Secondary | ICD-10-CM | POA: Diagnosis present

## 2019-04-26 HISTORY — PX: LEFT HEART CATH AND CORONARY ANGIOGRAPHY: CATH118249

## 2019-04-26 LAB — CBC
HCT: 37.7 % (ref 36.0–46.0)
Hemoglobin: 13 g/dL (ref 12.0–15.0)
MCH: 30.7 pg (ref 26.0–34.0)
MCHC: 34.5 g/dL (ref 30.0–36.0)
MCV: 88.9 fL (ref 80.0–100.0)
Platelets: 258 10*3/uL (ref 150–400)
RBC: 4.24 MIL/uL (ref 3.87–5.11)
RDW: 15.8 % — ABNORMAL HIGH (ref 11.5–15.5)
WBC: 7.9 10*3/uL (ref 4.0–10.5)
nRBC: 0 % (ref 0.0–0.2)

## 2019-04-26 LAB — PROTIME-INR
INR: 1.1 (ref 0.8–1.2)
Prothrombin Time: 13.7 seconds (ref 11.4–15.2)

## 2019-04-26 LAB — URINE CULTURE

## 2019-04-26 LAB — BASIC METABOLIC PANEL
Anion gap: 9 (ref 5–15)
BUN: 5 mg/dL — ABNORMAL LOW (ref 8–23)
CO2: 20 mmol/L — ABNORMAL LOW (ref 22–32)
Calcium: 8.7 mg/dL — ABNORMAL LOW (ref 8.9–10.3)
Chloride: 112 mmol/L — ABNORMAL HIGH (ref 98–111)
Creatinine, Ser: 0.95 mg/dL (ref 0.44–1.00)
GFR calc Af Amer: >60 mL/min (ref >60)
GFR calc non Af Amer: 60 mL/min — ABNORMAL LOW (ref >60)
Glucose, Bld: 103 mg/dL — ABNORMAL HIGH (ref 70–99)
Potassium: 3.6 mmol/L (ref 3.5–5.1)
Sodium: 141 mmol/L (ref 135–145)

## 2019-04-26 LAB — HEMOGLOBIN A1C
Hgb A1c MFr Bld: 5.5 % (ref 4.8–5.6)
Mean Plasma Glucose: 111.15 mg/dL

## 2019-04-26 LAB — HEPARIN LEVEL (UNFRACTIONATED): Heparin Unfractionated: 0.2 IU/mL — ABNORMAL LOW (ref 0.30–0.70)

## 2019-04-26 LAB — SARS CORONAVIRUS 2 (TAT 6-24 HRS): SARS Coronavirus 2: NEGATIVE

## 2019-04-26 LAB — TROPONIN I (HIGH SENSITIVITY): Troponin I (High Sensitivity): 1193 ng/L (ref <18)

## 2019-04-26 SURGERY — LEFT HEART CATH AND CORONARY ANGIOGRAPHY
Anesthesia: LOCAL

## 2019-04-26 MED ORDER — SODIUM CHLORIDE 0.9 % IV SOLN: 250.0000 mL | INTRAVENOUS | Status: DC | PRN

## 2019-04-26 MED ORDER — HEPARIN (PORCINE) IN NACL 1000-0.9 UT/500ML-% IV SOLN
INTRAVENOUS | Status: AC
  Filled 2019-04-26: qty 1000

## 2019-04-26 MED ORDER — ONDANSETRON HCL 4 MG PO TABS: 4.0000 mg | ORAL_TABLET | Freq: Four times a day (QID) | ORAL | Status: DC | PRN

## 2019-04-26 MED ORDER — ACETAMINOPHEN 650 MG RE SUPP: 650.0000 mg | Freq: Four times a day (QID) | RECTAL | Status: DC | PRN

## 2019-04-26 MED ORDER — FENTANYL CITRATE (PF) 100 MCG/2ML IJ SOLN
INTRAMUSCULAR | Status: DC | PRN
  Administered 2019-04-26: 50 ug via INTRAVENOUS

## 2019-04-26 MED ORDER — ACETAMINOPHEN 325 MG PO TABS: 650.0000 mg | ORAL_TABLET | Freq: Four times a day (QID) | ORAL | Status: DC | PRN

## 2019-04-26 MED ORDER — MIDAZOLAM HCL 2 MG/2ML IJ SOLN
INTRAMUSCULAR | Status: DC | PRN
  Administered 2019-04-26: 2 mg via INTRAVENOUS

## 2019-04-26 MED ORDER — HYDRALAZINE HCL 20 MG/ML IJ SOLN: 10.0000 mg | INTRAMUSCULAR | Status: AC | PRN

## 2019-04-26 MED ORDER — SODIUM CHLORIDE 0.9% FLUSH: 3.0000 mL | Freq: Two times a day (BID) | INTRAVENOUS | Status: DC

## 2019-04-26 MED ORDER — FENTANYL CITRATE (PF) 100 MCG/2ML IJ SOLN
INTRAMUSCULAR | Status: AC
  Filled 2019-04-26: qty 2

## 2019-04-26 MED ORDER — NITROGLYCERIN 0.4 MG SL SUBL: 0.4000 mg | SUBLINGUAL_TABLET | SUBLINGUAL | Status: DC | PRN

## 2019-04-26 MED ORDER — HEPARIN (PORCINE) IN NACL 1000-0.9 UT/500ML-% IV SOLN
INTRAVENOUS | Status: AC
  Filled 2019-04-26: qty 500

## 2019-04-26 MED ORDER — LIDOCAINE HCL (PF) 1 % IJ SOLN
INTRAMUSCULAR | Status: AC
  Filled 2019-04-26: qty 30

## 2019-04-26 MED ORDER — ASPIRIN 81 MG PO CHEW
81.0000 mg | CHEWABLE_TABLET | Freq: Every day | ORAL | Status: DC
  Filled 2019-04-26: qty 1

## 2019-04-26 MED ORDER — IOHEXOL 350 MG/ML SOLN
INTRAVENOUS | Status: DC | PRN
  Administered 2019-04-26: 11:00:00 75 mL

## 2019-04-26 MED ORDER — SODIUM CHLORIDE 0.9% FLUSH: 3.0000 mL | INTRAVENOUS | Status: DC | PRN

## 2019-04-26 MED ORDER — LIDOCAINE HCL (PF) 1 % IJ SOLN
INTRAMUSCULAR | Status: DC | PRN
  Administered 2019-04-26: 15 mL

## 2019-04-26 MED ORDER — ONDANSETRON HCL 4 MG/2ML IJ SOLN: 4.0000 mg | Freq: Four times a day (QID) | INTRAMUSCULAR | Status: DC | PRN

## 2019-04-26 MED ORDER — SODIUM CHLORIDE 0.9 % WEIGHT BASED INFUSION: 1.0000 mL/kg/h | INTRAVENOUS | Status: AC

## 2019-04-26 MED ORDER — SODIUM CHLORIDE 0.9 % WEIGHT BASED INFUSION: 1.0000 mL/kg/h | INTRAVENOUS | Status: DC

## 2019-04-26 MED ORDER — HEPARIN BOLUS VIA INFUSION
2000.0000 [IU] | Freq: Once | INTRAVENOUS | Status: AC
  Administered 2019-04-26: 05:00:00 2000 [IU] via INTRAVENOUS
  Filled 2019-04-26: qty 2000

## 2019-04-26 MED ORDER — POLYETHYLENE GLYCOL 3350 17 G PO PACK
17.0000 g | PACK | Freq: Every day | ORAL | Status: DC | PRN
  Administered 2019-04-27: 17 g via ORAL
  Filled 2019-04-26: qty 1

## 2019-04-26 MED ORDER — ONDANSETRON HCL 4 MG PO TABS: 4.0000 mg | ORAL_TABLET | Freq: Three times a day (TID) | ORAL | Status: DC | PRN

## 2019-04-26 MED ORDER — TICAGRELOR 60 MG PO TABS
60.0000 mg | ORAL_TABLET | Freq: Two times a day (BID) | ORAL | Status: DC
  Administered 2019-04-26: 13:00:00 60 mg via ORAL
  Filled 2019-04-26 (×4): qty 1

## 2019-04-26 MED ORDER — ASPIRIN 81 MG PO CHEW: 81.0000 mg | CHEWABLE_TABLET | ORAL | Status: DC

## 2019-04-26 MED ORDER — HEPARIN (PORCINE) IN NACL 1000-0.9 UT/500ML-% IV SOLN
INTRAVENOUS | Status: DC | PRN
  Administered 2019-04-26 (×3): 500 mL

## 2019-04-26 MED ORDER — SODIUM CHLORIDE 0.9 % WEIGHT BASED INFUSION: 3.0000 mL/kg/h | INTRAVENOUS | Status: AC

## 2019-04-26 MED ORDER — PANTOPRAZOLE SODIUM 40 MG PO TBEC
40.0000 mg | DELAYED_RELEASE_TABLET | Freq: Every day | ORAL | Status: DC
  Filled 2019-04-26: qty 1

## 2019-04-26 MED ORDER — LABETALOL HCL 5 MG/ML IV SOLN: 10.0000 mg | INTRAVENOUS | Status: AC | PRN

## 2019-04-26 MED ORDER — ZOLPIDEM TARTRATE 5 MG PO TABS: 5.0000 mg | ORAL_TABLET | Freq: Every day | ORAL | Status: DC

## 2019-04-26 MED ORDER — SODIUM CHLORIDE 0.9 % WEIGHT BASED INFUSION: 3.0000 mL/kg/h | INTRAVENOUS | Status: DC

## 2019-04-26 MED ORDER — ATORVASTATIN CALCIUM 10 MG PO TABS
10.0000 mg | ORAL_TABLET | Freq: Every day | ORAL | Status: DC
  Filled 2019-04-26: qty 1

## 2019-04-26 MED ORDER — MIDAZOLAM HCL 2 MG/2ML IJ SOLN
INTRAMUSCULAR | Status: AC
  Filled 2019-04-26: qty 2

## 2019-04-26 SURGICAL SUPPLY — 13 items
CATH INFINITI 5FR ANG PIGTAIL (CATHETERS) ×1 IMPLANT
CATH INFINITI 5FR JL4 (CATHETERS) ×1 IMPLANT
CATH INFINITI JR4 5F (CATHETERS) ×1 IMPLANT
CLOSURE MYNX CONTROL 5F (Vascular Products) ×1 IMPLANT
KIT HEART LEFT (KITS) ×2 IMPLANT
PACK CARDIAC CATHETERIZATION (CUSTOM PROCEDURE TRAY) ×2 IMPLANT
SHEATH PINNACLE 5F 10CM (SHEATH) ×1 IMPLANT
SHEATH PINNACLE 6F 10CM (SHEATH) IMPLANT
SHEATH PROBE COVER 6X72 (BAG) ×1 IMPLANT
SYR MEDRAD MARK 7 150ML (SYRINGE) ×2 IMPLANT
TRANSDUCER W/STOPCOCK (MISCELLANEOUS) ×2 IMPLANT
TUBING CIL FLEX 10 FLL-RA (TUBING) ×2 IMPLANT
WIRE EMERALD 3MM-J .035X150CM (WIRE) ×1 IMPLANT

## 2019-04-26 NOTE — ED Notes (Signed)
Catheterization consent signed and placed at bedside.

## 2019-04-26 NOTE — ED Notes (Signed)
Pt refusing to change into a gown at this time. Pt reports she is allergic to polyester and is unable to wear our hospital provided gowns.

## 2019-04-26 NOTE — Progress Notes (Signed)
Progress Note  Patient Name: Donna Bailey Date of Encounter: 04/26/2019  Primary Cardiologist: Ena Dawley, MD   Subjective   Feeling well.  No recurrent back, arm or chest pain.   Inpatient Medications    Scheduled Meds: . [START ON 04/27/2019] aspirin  81 mg Oral Daily  . atorvastatin  10 mg Oral Daily  . carvedilol  3.125 mg Oral BID WC  . pantoprazole  40 mg Oral Daily  . ranolazine  500 mg Oral BID  . sodium chloride flush  3 mL Intravenous Q12H  . sodium chloride flush  3 mL Intravenous Q12H  . sodium chloride flush  3 mL Intravenous Q12H  . ticagrelor  60 mg Oral BID  . zolpidem  5 mg Oral QHS   Continuous Infusions: . sodium chloride    . sodium chloride    . sodium chloride    . sodium chloride 1 mL/kg/hr (04/26/19 1143)   PRN Meds: sodium chloride, sodium chloride, acetaminophen **OR** acetaminophen, hydrALAZINE, labetalol, nitroGLYCERIN, ondansetron (ZOFRAN) IV, [DISCONTINUED] ondansetron **OR** ondansetron (ZOFRAN) IV, ondansetron, polyethylene glycol, sodium chloride flush, sodium chloride flush   Vital Signs    Vitals:   04/26/19 1310 04/26/19 1315 04/26/19 1320 04/26/19 1416  BP: (!) 145/53   (!) 145/77  Pulse: 60 (!) 58 63 68  Resp: 14 16 (!) 22 (!) 22  Temp:    97.7 F (36.5 C)  TempSrc:    Oral  SpO2: 100% 99% 100% 97%  Weight:      Height:        Intake/Output Summary (Last 24 hours) at 04/26/2019 1446 Last data filed at 04/26/2019 1400 Gross per 24 hour  Intake 2360 ml  Output -  Net 2360 ml   Last 3 Weights 04/25/2019 04/25/2019 12/29/2018  Weight (lbs) 193 lb 193 lb 190 lb  Weight (kg) 87.544 kg 87.544 kg 86.183 kg      Telemetry    Sinus rhythm. - Personally Reviewed  ECG    Sinus rhythm.  Rate 73 bpm.  - Personally Reviewed  Physical Exam   VS:  BP (!) 145/77 (BP Location: Left Arm)   Pulse 68   Temp 97.7 F (36.5 C) (Oral)   Resp (!) 22   Ht 5\' 6"  (1.676 m)   Wt 87.5 kg   SpO2 97%   BMI 31.15 kg/m  , BMI  Body mass index is 31.15 kg/m. GENERAL:  Well appearing HEENT: Pupils equal round and reactive, fundi not visualized, oral mucosa unremarkable NECK:  No jugular venous distention, waveform within normal limits, carotid upstroke brisk and symmetric, no bruits LUNGS:  Clear to auscultation bilaterally HEART:  RRR.  PMI not displaced or sustained,S1 and S2 within normal limits, no S3, no S4, no clicks, no rubs, no murmurs ABD:  Flat, positive bowel sounds normal in frequency in pitch, no bruits, no rebound, no guarding, no midline pulsatile mass, no hepatomegaly, no splenomegaly EXT:  2 plus pulses throughout, no edema, no cyanosis no clubbing SKIN:  No rashes no nodules NEURO:  Cranial nerves II through XII grossly intact, motor grossly intact throughout PSYCH:  Cognitively intact, oriented to person place and time   Labs    High Sensitivity Troponin:   Recent Labs  Lab 04/25/19 1251 04/25/19 1406 04/25/19 2234 04/26/19 0755  TROPONINIHS 269* 793* 3,563* 1,193*      Chemistry Recent Labs  Lab 04/25/19 1251 04/25/19 1406 04/26/19 0755  NA 140  --  141  K 3.5  --  3.6  CL 109  --  112*  CO2 21*  --  20*  GLUCOSE 103*  --  103*  BUN 9  --  5*  CREATININE 1.00  --  0.95  CALCIUM 9.1  --  8.7*  PROT  --  5.7*  --   ALBUMIN  --  3.0*  --   AST  --  30  --   ALT  --  20  --   ALKPHOS  --  51  --   BILITOT  --  0.6  --   GFRNONAA 56*  --  60*  GFRAA >60  --  >60  ANIONGAP 10  --  9     Hematology Recent Labs  Lab 04/25/19 1251 04/26/19 0259  WBC 8.2 7.9  RBC 4.76 4.24  HGB 14.2 13.0  HCT 42.3 37.7  MCV 88.9 88.9  MCH 29.8 30.7  MCHC 33.6 34.5  RDW 15.6* 15.8*  PLT 290 258    BNPNo results for input(s): BNP, PROBNP in the last 168 hours.   DDimer No results for input(s): DDIMER in the last 168 hours.   Radiology    Dg Chest Portable 1 View  Result Date: 04/25/2019 CLINICAL DATA:  Chest pain. EXAM: PORTABLE CHEST 1 VIEW COMPARISON:  Radiograph of November 25, 2018. FINDINGS: The heart size and mediastinal contours are within normal limits. Both lungs are clear. The visualized skeletal structures are unremarkable. IMPRESSION: No active disease. Electronically Signed   By: Marijo Conception M.D.   On: 04/25/2019 13:34    Cardiac Studies   LHC 04/26/19: 1. Continued patency of the LAD stent with minimal in-stent restenosis 2. Stable, moderate stenosis of the LCx ostium, mid-diagonal branch, and RCA, all lesions unchanged from previous cath studies 3. Normal LV function with normal LVEDP  Patient Profile     73 y.o. female with CAD s/p STEMI and LAD PCI 11/2017, hyperlipidemia, asthma and Barrett's esophagus here with nausea, vomiting and anginal pain.  Assessment & Plan    # NSTEMI: This was a type II NSTEMI.  She has known moderate CAD that is stable on cath.  She was profoundly hypotensive on admission in the setting of nausea, vomiting, continued diuretic use and taking sublingual nitro.  We discussed not taking diuretics when having diarrhea and checking BP if able before taking NTG.  No changes to current therapy are recommended.   [facility] will sign off.   Medication Recommendations:  Resume home meds Other recommendations (labs, testing, etc):  none Follow up as an outpatient:  We will arrange follow up  For questions or updates, please contact [place] Please consult [email or web address] for contact info under        Signed, Skeet Latch, MD  04/26/2019, 2:46 PM

## 2019-04-26 NOTE — ED Notes (Signed)
Pt reports that she took 324mg  of aspirin yesterday prior to coming to the ED.

## 2019-04-26 NOTE — ED Notes (Signed)
Spoke with Chaney Malling NP  No new orders recieved

## 2019-04-26 NOTE — ED Notes (Signed)
Pt now refuses to wear pulse ox, st's she is allergic to it

## 2019-04-26 NOTE — Progress Notes (Signed)
ANTICOAGULATION CONSULT NOTE - Follow Up Consult  Pharmacy Consult for heparin Indication: chest pain/ACS  Labs: Recent Labs    04/25/19 1251 04/25/19 1406 04/25/19 2234 04/26/19 0259  HGB 14.2  --   --  13.0  HCT 42.3  --   --  37.7  PLT 290  --   --  258  HEPARINUNFRC  --   --   --  0.20*  CREATININE 1.00  --   --   --   TROPONINIHS 269* 793* 3,563*  --     Assessment: 73yo female subtherapeutic on heparin with initial dosing for CP/ACS.  Goal of Therapy:  Heparin level 0.3-0.7 units/ml   Plan:  Will rebolus with heparin 2000 units and increase heparin gtt by 2 units/kg/hr to 1100 units/hr and check level in 6 hours.    Wynona Neat, PharmD, BCPS  04/26/2019,3:47 AM

## 2019-04-26 NOTE — ED Notes (Signed)
Pt took 81mg  of her own aspirin and 3.125mg  of her own Coreg this morning. Pt refusing to take any of the ED provided medications. Pt reports "those are too expensive I am taking my own."

## 2019-04-26 NOTE — Plan of Care (Signed)
  Problem: Activity: Goal: Ability to return to baseline activity level will improve Outcome: Completed/Met   Problem: Cardiovascular: Goal: Ability to achieve and maintain adequate cardiovascular perfusion will improve Outcome: Completed/Met Goal: Vascular access site(s) Level 0-1 will be maintained Outcome: Completed/Met   

## 2019-04-26 NOTE — Progress Notes (Signed)
Pt has home medications with her. Discussed it with the pt about the protocol of sending the Zolpidem (Ambien) tablet and the rest of the medications to the pharmacy per protocol but pt refused. Pt stated " I have my home meds with me and I"m not going to take the medications here because they charged me too much. My cardiologist Dr. Ena Dawley told me I can bring my own medications with me. " Charge nurse was notified. Will continue to monitor pt.

## 2019-04-26 NOTE — Progress Notes (Signed)
[facility] PROGRESS NOTE  MARRI Bailey [id number] DOB: September 12, 1945 DOA: 04/25/2019 PCP: [name], MD  Assessment/Plan:  #1. Unstable angina/possible ACS/chest and right arm pain. Hx CAD and STEMI 4/19 with stent. Cath 11/2018. Patient with some non-compliance with meds due to perceived allergies/intolerances. Evaluated by cards who opined symptoms atypical but do seem to be her anginal equivalent and troponin increased in pattern consistent with ACS and recommend LHC. No further pain during night -cath today per cards -heparin gtt -await further recommendations  #2. CAD with stent to LAD 11/2017. Home meds include lipitor, aspirin, brlinta, plendil -see #1.   #3. Hypotension. Yesterday BP quite soft. Provided with IV fluids and today BP low end of normal.  -continue low dose coreg  #4. Hyperlipidemia.  -continue statin  #5. Obesity. BMI 31.7  Code Status: full Family Communication: patient Disposition Plan: home when ready   Consultants:  [facility] cardiology  Procedures:  Cath 04/26/19  Antibiotics:  none  HPI/Subjective: Awake alert. Denies chest pain but complains of "ulcer on roof of my mount and im afraid it is cancer". Reports allergy to room, gown and intolerance to hospital med and states "I will take my own medications"  Objective: Vitals:   04/26/19 0530 04/26/19 0800  BP: 120/65 (!) 116/57  Pulse:  71  Resp: 19 16  Temp:    SpO2:  99%    Intake/Output Summary (Last 24 hours) at 04/26/2019 0958 Last data filed at 04/25/2019 1726 Gross per 24 hour  Intake 2000 ml  Output -  Net 2000 ml   Filed Weights   04/25/19 1411 04/25/19 1442  Weight: 87.5 kg 87.5 kg    Exam:   General:  Awake alert no acute distress. Preoccupied with ulcer on roof of mouth  Cardiovascular: rrr no mgr no LE edema  Respiratory: normal effort BS clear bilaterally no wheeze  Abdomen: obese soft +BS no guarding or rebounding  Musculoskeletal:  joints without swelling/erythema   Data Reviewed: Basic Metabolic Panel: Recent Labs  Lab 04/25/19 1251 04/25/19 2234 04/26/19 0755  NA 140  --  141  K 3.5  --  3.6  CL 109  --  112*  CO2 21*  --  20*  GLUCOSE 103*  --  103*  BUN 9  --  5*  CREATININE 1.00  --  0.95  CALCIUM 9.1  --  8.7*  MG  --  1.7  --    Liver Function Tests: Recent Labs  Lab 04/25/19 1406  AST 30  ALT 20  ALKPHOS 51  BILITOT 0.6  PROT 5.7*  ALBUMIN 3.0*   Recent Labs  Lab 04/25/19 1406  LIPASE 23   No results for input(s): AMMONIA in the last 168 hours. CBC: Recent Labs  Lab 04/25/19 1251 04/26/19 0259  WBC 8.2 7.9  HGB 14.2 13.0  HCT 42.3 37.7  MCV 88.9 88.9  PLT 290 258   Cardiac Enzymes: No results for input(s): CKTOTAL, CKMB, CKMBINDEX, TROPONINI in the last 168 hours. BNP (last 3 results) No results for input(s): BNP in the last 8760 hours.  ProBNP (last 3 results) No results for input(s): PROBNP in the last 8760 hours.  CBG: No results for input(s): GLUCAP in the last 168 hours.  Recent Results (from the past 240 hour(s))  Culture, blood (routine x 2)     Status: None (Preliminary result)   Collection Time: 04/25/19  2:30 PM   Specimen: BLOOD LEFT HAND  Result Value Ref Range Status  Specimen Description BLOOD LEFT HAND  Final   Special Requests   Final    BOTTLES DRAWN AEROBIC ONLY Blood Culture results may not be optimal due to an inadequate volume of blood received in culture bottles   Culture   Final    NO GROWTH < 24 HOURS Performed at [facility] [address], [place], [place] [address]    Report Status PENDING  Incomplete  Culture, blood (routine x 2)     Status: None (Preliminary result)   Collection Time: 04/25/19  4:57 PM   Specimen: BLOOD LEFT WRIST  Result Value Ref Range Status   Specimen Description BLOOD LEFT WRIST  Final   Special Requests   Final    BOTTLES DRAWN AEROBIC AND ANAEROBIC Blood Culture results may not be optimal due to an  inadequate volume of blood received in culture bottles   Culture   Final    NO GROWTH < 24 HOURS Performed at [facility], [place] [address], [place], [place] [address]    Report Status PENDING  Incomplete  SARS CORONAVIRUS 2 (TAT 6-24 HRS) Nasopharyngeal Nasopharyngeal Swab     Status: None   Collection Time: 04/25/19  9:44 PM   Specimen: Nasopharyngeal Swab  Result Value Ref Range Status   SARS Coronavirus 2 NEGATIVE NEGATIVE Final    Comment: (NOTE) SARS-CoV-2 target nucleic acids are NOT DETECTED. The SARS-CoV-2 RNA is generally detectable in upper and lower respiratory specimens during the acute phase of infection. Negative results do not preclude SARS-CoV-2 infection, do not rule out co-infections with other pathogens, and should not be used as the sole basis for treatment or other patient management decisions. Negative results must be combined with clinical observations, patient history, and epidemiological information. The expected result is Negative. Fact Sheet for Patients: SugarRoll.be Fact Sheet for Healthcare Providers: https://www.woods-mathews.com/ This test is not yet approved or cleared by the [place] FDA and  has been authorized for detection and/or diagnosis of SARS-CoV-2 by FDA under an Emergency Use Authorization (EUA). This EUA will remain  in effect (meaning this test can be used) for the duration of the COVID-19 declaration under Section 56 4(b)(1) of the Act, 21 U.S.C. section 360bbb-3(b)(1), unless the authorization is terminated or revoked sooner. Performed at [facility], [place] [address], [place],  [address]      Studies: Dg Chest Portable 1 View  Result Date: 04/25/2019 CLINICAL DATA:  Chest pain. EXAM: PORTABLE CHEST 1 VIEW COMPARISON:  Radiograph of November 25, 2018. FINDINGS: The heart size and mediastinal contours are within normal limits. Both lungs are clear. The visualized  skeletal structures are unremarkable. IMPRESSION: No active disease. Electronically Signed   By: Marijo Conception M.D.   On: 04/25/2019 13:34    Scheduled Meds: . aspirin  324 mg Oral Once  . [START ON 04/27/2019] aspirin  81 mg Oral Daily  . atorvastatin  10 mg Oral Daily  . carvedilol  3.125 mg Oral BID WC  . ranolazine  500 mg Oral BID  . sodium chloride flush  3 mL Intravenous Once  . ticagrelor  60 mg Oral BID   Continuous Infusions: . sodium chloride     Followed by  . sodium chloride    . 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

## 2019-04-26 NOTE — Interval H&P Note (Signed)
Cath Lab Visit (complete for each Cath Lab visit)  Clinical Evaluation Leading to the Procedure:   ACS: Yes.    Non-ACS:    Anginal Classification: CCS IV  Anti-ischemic medical therapy: Minimal Therapy (1 class of medications)  Non-Invasive Test Results: No non-invasive testing performed  Prior CABG: No previous CABG      History and Physical Interval Note:  04/26/2019 10:24 AM  Donna Bailey  has presented today for surgery, with the diagnosis of NSTEMI.  The various methods of treatment have been discussed with the patient and family. After consideration of risks, benefits and other options for treatment, the patient has consented to  Procedure(s): LEFT HEART CATH AND CORONARY ANGIOGRAPHY (N/A) as a surgical intervention.  The patient's history has been reviewed, patient examined, no change in status, stable for surgery.  I have reviewed the patient's chart and labs.  Questions were answered to the patient's satisfaction.     Sherren Mocha

## 2019-04-27 DIAGNOSIS — E669 Obesity, unspecified: Secondary | ICD-10-CM

## 2019-04-27 DIAGNOSIS — I200 Unstable angina: Secondary | ICD-10-CM

## 2019-04-27 LAB — CBC
HCT: 33.6 % — ABNORMAL LOW (ref 36.0–46.0)
Hemoglobin: 11.3 g/dL — ABNORMAL LOW (ref 12.0–15.0)
MCH: 29.7 pg (ref 26.0–34.0)
MCHC: 33.6 g/dL (ref 30.0–36.0)
MCV: 88.2 fL (ref 80.0–100.0)
Platelets: 244 10*3/uL (ref 150–400)
RBC: 3.81 MIL/uL — ABNORMAL LOW (ref 3.87–5.11)
RDW: 16.1 % — ABNORMAL HIGH (ref 11.5–15.5)
WBC: 6.5 10*3/uL (ref 4.0–10.5)
nRBC: 0 % (ref 0.0–0.2)

## 2019-04-27 LAB — BASIC METABOLIC PANEL
Anion gap: 9 (ref 5–15)
BUN: 8 mg/dL (ref 8–23)
CO2: 20 mmol/L — ABNORMAL LOW (ref 22–32)
Calcium: 8.4 mg/dL — ABNORMAL LOW (ref 8.9–10.3)
Chloride: 111 mmol/L (ref 98–111)
Creatinine, Ser: 1.1 mg/dL — ABNORMAL HIGH (ref 0.44–1.00)
GFR calc Af Amer: 58 mL/min — ABNORMAL LOW (ref >60)
GFR calc non Af Amer: 50 mL/min — ABNORMAL LOW (ref >60)
Glucose, Bld: 109 mg/dL — ABNORMAL HIGH (ref 70–99)
Potassium: 3.8 mmol/L (ref 3.5–5.1)
Sodium: 140 mmol/L (ref 135–145)

## 2019-04-27 MED ORDER — RANOLAZINE ER 500 MG PO TB12: 500.0000 mg | ORAL_TABLET | Freq: Two times a day (BID) | ORAL | 0 refills | Status: DC

## 2019-04-27 NOTE — TOC Transition Note (Signed)
Transition of Care [facility]) - CM/SW Discharge Note   Patient Details  Name: Donna Bailey MRN: [id number] Date of Birth: Jun 16, 1946  [facility] CM/SW Contact:  Marilu Favre, RN Phone Number: 04/27/2019, 12:00 PM   Clinical Narrative:     PT recommending home health PT. Discussed with patient. Patient voiced understanding but declines home health PT at this time. If she changes her mind she will contact her PCP to arrange.   Patient does want a 3 in1 , same ordered and called Zack with [employer].  Final next level of care: Home/Self Care Barriers to Discharge: No Barriers Identified   Patient Goals and CMS Choice Patient states their goals for this hospitalization and ongoing recovery are:: to go home CMS Medicare.gov Compare Post Acute Care list provided to:: Patient Choice offered to / list presented to : Patient  Discharge Placement                       Discharge Plan and Services   Discharge Planning Services: CM Consult Post Acute Care Choice: Home Health          DME Arranged: 3-N-1 DME Agency: [employer] Date DME Agency Contacted: 04/27/19 Time DME Agency Contacted: [id number] Representative spoke with at DME Agency: [place]: Refused [place]          Social Determinants of Health ([place]) Interventions     Readmission Risk Interventions No flowsheet data found.

## 2019-04-27 NOTE — Evaluation (Signed)
Physical Therapy Evaluation Patient Details Name: Donna Bailey MRN: [id number] DOB: Jun 14, 1946 Today's Date: 04/27/2019   History of Present Illness  73 y.o. female with medical history significant of past medical history of ischemic cardiomyopathy of EF of 65%, coronary artery disease and STEMI on 4/19 with a stent placed to the LAD during that event she had radial artery dissection with significant two-vessel coronary artery disease, GERD, Barrett's esophagus, HLD, asthma.  Admitted 9/21 for treatment of NSTEMI.  Clinical Impression  PTA pt living with husband in 2 story home with ramped entrance and bed and bath on 1st floor. Pt reports ambulation within homewithout AD, and use of RW for community ambulation. Pt is independent with ADLs, and iADLs but reports difficulty with cleaning and cooking and most of the time eats take out. Pt is currently limited in safe mobility by decreased balance, and generalized weakness. Pt is mod I for bed mobility, supervision for transfers, min guard for ambulation without AD and minA for ascent/descent of 2 steps. PT recommending HHPT for work on higher level balance and strengthening. PT will continue to follow acutely.    Follow Up Recommendations Home health PT    Equipment Recommendations  3in1 (PT)    Recommendations for Other Services       Precautions / Restrictions Precautions Precautions: Fall Precaution Comments: hx of falls on medication      Mobility  Bed Mobility Overal bed mobility: Modified Independent             General bed mobility comments: increased time and effort, use of bed rail to pull up  Transfers Overall transfer level: Needs assistance   Transfers: Sit to/from Stand Sit to Stand: Supervision         General transfer comment: supervision for safety, increased forward lean and increased time and effort to power up, able to self steady once in standing   Ambulation/Gait Ambulation/Gait assistance: Min  guard Gait Distance (Feet): 300 Feet Assistive device: None Gait Pattern/deviations: Step-through pattern;Decreased step length - right;Decreased step length - left Gait velocity: slowed Gait velocity interpretation: <1.31 ft/sec, indicative of household ambulator General Gait Details: min guard for safety with ambulation, mildly unsteady however no overt LoB  Stairs Stairs: Yes Stairs assistance: Min assist Stair Management: Sideways;One rail Right;Step to pattern Number of Stairs: 2 General stair comments: min A for support with power up for laterally stepping 2 steps, after stair training pt reports she can also use ramp to get into her home, encouraged pt to utilize ramp  Wheelchair Mobility    Modified Rankin (Stroke Patients Only)       Balance Overall balance assessment: Needs assistance Sitting-balance support: Feet supported;No upper extremity supported Sitting balance-Leahy Scale: Normal     Standing balance support: No upper extremity supported Standing balance-Leahy Scale: Good                               Pertinent Vitals/Pain Pain Assessment: 0-10 Pain Score: 3  Pain Location: neck, knee, R foot, back  Pain Descriptors / Indicators: Dull;Aching Pain Intervention(s): Limited activity within patient's tolerance;Monitored during session;Repositioned    Home Living Family/patient expects to be discharged to:: Private residence Living Arrangements: Spouse/significant other Available Help at Discharge: Family;Available 24 hours/day Type of Home: House Home Access: Stairs to enter;Ramped entrance Entrance Stairs-Rails: Can reach both Entrance Stairs-Number of Steps: 7 Home Layout: Two level;Able to live on main level with bedroom/bathroom Home  Equipment: Dan Humphreys - 2 wheels;Walker - standard;Toilet riser      Prior Function Level of Independence: Needs assistance   Gait / Transfers Assistance Needed: household ambulation without AD, uses RW for  community distances  ADL's / Homemaking Assistance Needed: able to do limited cooking and cleaning, mostly eats take out, independent in bathing and dressing        Hand Dominance        Extremity/Trunk Assessment   Upper Extremity Assessment Upper Extremity Assessment: RUE deficits/detail;LUE deficits/detail RUE Sensation: history of peripheral neuropathy    Lower Extremity Assessment Lower Extremity Assessment: RLE deficits/detail;LLE deficits/detail RLE Deficits / Details: R ankle sx, hip and knee ROM WFL, ankle lacking full dorsiflexion, strength grossly assessed at 4-/5  RLE Sensation: history of peripheral neuropathy RLE Coordination: decreased fine motor LLE Deficits / Details: hx knee injury, hip and foot ROM WFL, knee lacking full extension, strength grossly assessed at 4-/5 LLE Sensation: history of peripheral neuropathy LLE Coordination: decreased fine motor       Communication   Communication: No difficulties  Cognition Arousal/Alertness: Awake/alert Behavior During Therapy: WFL for tasks assessed/performed Overall Cognitive Status: Within Functional Limits for tasks assessed                                        General Comments General comments (skin integrity, edema, etc.): mild edema noted in bilateral calves pt reports as normal     Exercises     Assessment/Plan    PT Assessment Patient needs continued PT services  PT Problem List Decreased balance;Decreased mobility;Impaired sensation       PT Treatment Interventions DME instruction;Gait training;Stair training;Functional mobility training;Therapeutic activities;Balance training;Therapeutic exercise;Cognitive remediation;Patient/family education    PT Goals (Current goals can be found in the Care Plan section)  Acute Rehab PT Goals Patient Stated Goal: to get stair lift so that she can get to 2nd floor of her home PT Goal Formulation: With patient Time For Goal Achievement:  05/11/19 Potential to Achieve Goals: Good    Frequency Min 3X/week   Barriers to discharge        Co-evaluation               AM-PAC PT "6 Clicks" Mobility  Outcome Measure Help needed turning from your back to your side while in a flat bed without using bedrails?: None Help needed moving from lying on your back to sitting on the side of a flat bed without using bedrails?: None Help needed moving to and from a bed to a chair (including a wheelchair)?: None Help needed standing up from a chair using your arms (e.g., wheelchair or bedside chair)?: None Help needed to walk in hospital room?: None Help needed climbing 3-5 steps with a railing? : A Little 6 Click Score: 23    End of Session Equipment Utilized During Treatment: Gait belt Activity Tolerance: Patient tolerated treatment well Patient left: in bed;with call bell/phone within reach Nurse Communication: Mobility status PT Visit Diagnosis: Unsteadiness on feet (R26.81);Other abnormalities of gait and mobility (R26.89)    Time: 6962-9528 PT Time Calculation (min) (ACUTE ONLY): 23 min   Charges:   PT Evaluation $PT Eval Moderate Complexity: 1 Mod PT Treatments $Gait Training: 8-22 mins        Netanya Yazdani B. Beverely Risen PT, DPT Acute Rehabilitation Services Pager [phone] Office [phone]   [place]on Alas [facility] 04/27/2019,  10:19 AM

## 2019-04-28 NOTE — Discharge Summary (Addendum)
Physician Discharge Summary  Donna Bailey [id number] DOB: 10-31-1945 DOA: 04/25/2019  PCP: [name], MD  Admit date: 04/25/2019 Discharge date: 04/27/2019  Admitted From: Home.  Disposition:Home.   Recommendations for Outpatient Follow-up:  1. Follow up with PCP in 1-2 weeks 2. Please obtain BMP/CBC in one week Please follow up with cardiology as recommended.   Discharge Condition:stable.  CODE STATUS:full code.  Diet recommendation: Heart Healthy    Brief/Interim Summary: 73 y.o. female with medical history significant of past medical history of ischemic cardiomyopathy of EF of 65%, coronary artery disease and STEMI on 4/19 with a stent placed to the LAD during that event she had radial artery dissection with significant two-vessel coronary artery disease,  Underwent cath again on 01/2018 that showed no change, and then Again on April 2020 that was unchanged from previous cardiac cath presents with hypotension, and chest pain radiating to  between scapula.   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  Cardiology consulted, LHC done on 9/22 without any significant change.  Cardiology recommended to check BP prior to taking NTG.  No further recommendations other than to continue with medical management.  Chest pain and arm pain resolved.  PT eval recommended home health PT. PT is refusing to  Home health PT, RN, or outpatient PT at this time due to her allergies to polyester. Discussed at length regarding the importance of continued PT, but she still refuses .    Hyperlipidemia:  Resume statin on discharge.   Obesity  Outpatient follow up with PCP for weight loss.     Hypotension  Resolved.  Resume home meds on discharge.    Nausea, vomiting and diarrhea:  Resolved spontaneously.     Discharge Instructions  Discharge Instructions    Diet - low sodium heart healthy   Complete by: As directed    Discharge instructions   Complete by: As directed    Follow up with cardiology as recommended.     Allergies as of 04/27/2019      Reactions   Other Anaphylaxis, Itching, Rash, Other (See Comments)   Corn fillers, corn by-products - causes severe itching Polyester-"pins sticking in her skin   Sulfa Antibiotics Anaphylaxis   Sulfonamide Derivatives Anaphylaxis   Ciprofloxacin Rash, Other (See Comments)   Nitrofurantoin Diarrhea, Nausea And Vomiting, Other (See Comments)   Also, "convulsions/constant shaking"- per patient   Penicillins Rash   Underarms (both) Has patient had a PCN reaction causing immediate rash, facial/tongue/throat swelling, SOB or lightheadedness with hypotension: YES Has patient had a PCN reaction causing severe rash involving mucus membranes or skin necrosis: NO Has patient had a PCN reaction that required hospitalization NO Has patient had a PCN reaction occurring within the last 10 years: NO If all of the above answers are "NO", then may proceed with Cephalosporin use. Other reaction(s): Other (See Comments) Underarms (both) Other Reaction: "red hot skin" Underarms (both) Has patient had a PCN reaction causing immediate rash, facial/tongue/throat swelling, SOB or lightheadedness with hypotension: YES Has patient had a PCN reaction causing severe rash involving mucus membranes or skin necrosis: NO Has patient had a PCN reaction that required hospitalization NO Has  patient had a PCN reaction occurring within the last 10 years: NO If all of the above answers are "NO", then may proceed with Cephalosporin use.   Cephalexin Other (See Comments)   Reaction not recalled by the patient   Cephalosporins Itching   Other  reaction(s): Other (See Comments) unknown   Crestor [rosuvastatin] Other (See Comments)   Lost all muscle mobility    Doxycycline Diarrhea, Nausea And Vomiting   Other reaction(s): Other (See Comments)   Formoterol Other (See Comments)   Reaction not recalled   Formoterol Fumarate Other (See Comments)   Reaction not recalled   Gold Sodium Thiosulfate Other (See Comments)   Positive patch test   Gold-containing Drug Products Other (See Comments)   Skin tingles   Levofloxacin In D5w Other (See Comments)   Other Reaction: racing heart Other reaction(s): Other (See Comments) Other Reaction: racing heart   Macrodantin [nitrofurantoin Macrocrystal] Itching   Moxifloxacin Other (See Comments)   Caused hands to "shake"   Neomycin Other (See Comments)   Doesn't remember - allergist said not to use it because it could add more allergies- Positive patch test   Ofloxacin Itching   Valsartan Other (See Comments)   Pt reports this med causes her to feel sluggish and she feels heaviness on her shoulders.   Acetaminophen Rash, Other (See Comments)   Facial rash   Fexofenadine Palpitations, Other (See Comments)   Heart races   Ibuprofen Rash   Latex Rash, Other (See Comments)   Skin gets red   Levofloxacin Palpitations, Other (See Comments)   HEART RACING   Mold Extract [trichophyton Mentagrophyte] Other (See Comments)   Bumps on back, stops up sinuses.   Molds & Smuts Rash, Other (See Comments)   Bumps on back, stops up sinuses.   Nickel Rash   Tape Rash   Trichophyton Rash, Other (See Comments)   Bumps on back, stops up sinuses      Medication List    TAKE these medications   aspirin 81 MG chewable tablet Chew 1 tablet (81 mg total) by mouth daily.   atorvastatin 10 MG tablet Commonly known as: LIPITOR Take 1 tablet (10 mg total) by mouth daily.   carvedilol 6.25 MG tablet Commonly known as: COREG Take 1 tablet (6.25 mg total) by mouth 2 (two) times daily with a meal.    diphenhydrAMINE 25 mg capsule Commonly known as: BENADRYL Take 25 mg by mouth every 6 (six) hours as needed for itching or allergies.   esomeprazole 20 MG capsule Commonly known as: NEXIUM Take 20 mg by mouth daily before breakfast.   felodipine 10 MG 24 hr tablet Commonly known as: PLENDIL Take 1 tablet (10 mg total) by mouth daily.   furosemide 20 MG tablet Commonly known as: LASIX Take 20 mg by mouth daily.   guaiFENesin-codeine 100-10 MG/5ML syrup TAKE 5ML BY MOUTH 3 TIMES DAILY AS NEEDED FOR COUGH What changed: See the new instructions.   Monurol 3 g Pack Generic drug: fosfomycin Take 3 g by mouth See admin instructions. Mix 1 packet (3 grams) into 3-4 ounces of water drink once a day as needed for bladder or UTI infections- use as directed   nitroGLYCERIN 0.4 MG SL tablet Commonly known as: NITROSTAT Place 1 tablet (0.4 mg total) under the tongue every 5 (five) minutes as needed for chest pain.   ondansetron 4 MG tablet Commonly known as: ZOFRAN Take 1 tablet (4 mg total) by mouth every 8 (eight) hours as  needed for nausea or vomiting.   ranolazine 500 MG 12 hr tablet Commonly known as: RANEXA Take 1 tablet (500 mg total) by mouth 2 (two) times daily.   ticagrelor 60 MG Tabs tablet Commonly known as: Brilinta Take 1 tablet (60 mg total) by mouth 2 (two) times daily. What changed: when to take this   valACYclovir 1000 MG tablet Commonly known as: VALTREX Take 1,000 mg by mouth 3 (three) times daily. FOR 21 DAYS   VITAMIN B-12 IJ Inject 1,000 mg as directed every 30 (thirty) days.   zolpidem 10 MG tablet Commonly known as: AMBIEN Take 1 tablet (10 mg total) by mouth at bedtime.      Follow-up Information    Charlie Pitter, PA-C Follow up.   Specialties: Cardiology, Radiology Why: You have a follow-up visit scheduled for Friday 05/19/2019 at 10:45am with Melina Copa, one of Dr. Francesca Oman PAs. Please arrive 15 minutes early for check-in. If this date/time  does not work for you, please call our office to reschedule. Contact information: [address] [place] [address] [phone]        [name], MD. Schedule an appointment as soon as possible for a visit in 1 week(s).   Specialty: Family Medicine Contact information: [place] [address] [phone]        Dorothy Spark, MD .   Specialty: Cardiology Contact information: [place] [phone] [phone]          Allergies  Allergen Reactions  . Other Anaphylaxis, Itching, Rash and Other (See Comments)    Corn fillers, corn by-products - causes severe itching Polyester-"pins sticking in her skin   . Sulfa Antibiotics Anaphylaxis  . Sulfonamide Derivatives Anaphylaxis  . Ciprofloxacin Rash and Other (See Comments)  . Nitrofurantoin Diarrhea, Nausea And Vomiting and Other (See Comments)    Also, "convulsions/constant shaking"- per patient  . Penicillins Rash    Underarms (both) Has patient had a PCN reaction causing immediate rash, facial/tongue/throat swelling, SOB or lightheadedness with hypotension: YES Has patient had a PCN reaction causing severe rash involving mucus membranes or skin necrosis: NO Has patient had a PCN reaction that required hospitalization NO Has patient had a PCN reaction occurring within the last 10 years: NO If all of the above answers are "NO", then may proceed with Cephalosporin use. Other reaction(s): Other (See Comments) Underarms (both) Other Reaction: "red hot skin" Underarms (both) Has patient had a PCN reaction causing immediate rash, facial/tongue/throat swelling, SOB or lightheadedness with hypotension: YES Has patient had a PCN reaction causing severe rash involving mucus membranes or skin necrosis: NO Has patient had a PCN reaction that required hospitalization NO Has patient had a PCN reaction occurring within the last 10 years: NO If all  of the above answers are "NO", then may proceed with Cephalosporin use.  . Cephalexin Other (See Comments)    Reaction not recalled by the patient  . Cephalosporins Itching    Other reaction(s): Other (See Comments) unknown  . Crestor [Rosuvastatin] Other (See Comments)    Lost all muscle mobility   . Doxycycline Diarrhea and Nausea And Vomiting    Other reaction(s): Other (See Comments)  . Formoterol Other (See Comments)    Reaction not recalled  . Formoterol Fumarate Other (See Comments)    Reaction not recalled  . Gold Sodium Thiosulfate Other (See Comments)    Positive patch test  . Gold-Containing Drug Products  Other (See Comments)    Skin tingles  . Levofloxacin In D5w Other (See Comments)    Other Reaction: racing heart Other reaction(s): Other (See Comments) Other Reaction: racing heart  . Macrodantin [Nitrofurantoin Macrocrystal] Itching  . Moxifloxacin Other (See Comments)    Caused hands to "shake"  . Neomycin Other (See Comments)    Doesn't remember - allergist said not to use it because it could add more allergies- Positive patch test   . Ofloxacin Itching  . Valsartan Other (See Comments)    Pt reports this med causes her to feel sluggish and she feels heaviness on her shoulders.  . Acetaminophen Rash and Other (See Comments)    Facial rash  . Fexofenadine Palpitations and Other (See Comments)    Heart races  . Ibuprofen Rash  . Latex Rash and Other (See Comments)    Skin gets red   . Levofloxacin Palpitations and Other (See Comments)    HEART RACING  . Mold Extract [Trichophyton Mentagrophyte] Other (See Comments)    Bumps on back, stops up sinuses.  . Molds & Smuts Rash and Other (See Comments)    Bumps on back, stops up sinuses.  . Nickel Rash  . Tape Rash  . Trichophyton Rash and Other (See Comments)    Bumps on back, stops up sinuses    Consultations:  Cardiology.    Procedures/Studies: Dg Chest Portable 1 View  Result Date:  04/25/2019 CLINICAL DATA:  Chest pain. EXAM: PORTABLE CHEST 1 VIEW COMPARISON:  Radiograph of November 25, 2018. FINDINGS: The heart size and mediastinal contours are within normal limits. Both lungs are clear. The visualized skeletal structures are unremarkable. IMPRESSION: No active disease. Electronically Signed   By: Marijo Conception M.D.   On: 04/25/2019 13:34    LHC on 04/26/19  Subjective: No chest pain or sob, nausea or vomiting.   Discharge Exam: Vitals:   04/27/19 0435 04/27/19 0910  BP: (!) 136/59 135/68  Pulse: 77   Resp: 18   Temp: 97.9 F (36.6 C)   SpO2: 95%    Vitals:   04/26/19 2053 04/27/19 0435 04/27/19 0459 04/27/19 0910  BP: (!) 143/67 (!) 136/59  135/68  Pulse: 80 77    Resp: 20 18    Temp: 98.6 F (37 C) 97.9 F (36.6 C)    TempSrc: Oral Oral    SpO2: 98% 95%    Weight:   91.2 kg   Height:        General: Pt is alert, awake, not in acute distress Cardiovascular: RRR, S1/S2 +, no rubs, no gallops Respiratory: CTA bilaterally, no wheezing, no rhonchi Abdominal: Soft, NT, ND, bowel sounds + Extremities: no edema, no cyanosis    The results of significant diagnostics from this hospitalization (including imaging, microbiology, ancillary and laboratory) are listed below for reference.     Microbiology: Recent Results (from the past 240 hour(s))  Culture, blood (routine x 2)     Status: None (Preliminary result)   Collection Time: 04/25/19  2:30 PM   Specimen: BLOOD LEFT HAND  Result Value Ref Range Status   Specimen Description BLOOD LEFT HAND  Final   Special Requests   Final    BOTTLES DRAWN AEROBIC ONLY Blood Culture results may not be optimal due to an inadequate volume of blood received in culture bottles   Culture   Final    NO GROWTH 2 DAYS Performed at [facility], [place] [address], [place], [place] [address]  Report Status PENDING  Incomplete  Culture, blood (routine x 2)     Status: None (Preliminary result)   Collection Time:  04/25/19  4:57 PM   Specimen: BLOOD LEFT WRIST  Result Value Ref Range Status   Specimen Description BLOOD LEFT WRIST  Final   Special Requests   Final    BOTTLES DRAWN AEROBIC AND ANAEROBIC Blood Culture results may not be optimal due to an inadequate volume of blood received in culture bottles   Culture   Final    NO GROWTH 2 DAYS Performed at [facility], [place] [address], [place], [place] [address]    Report Status PENDING  Incomplete  Urine culture     Status: Abnormal   Collection Time: 04/25/19  7:04 PM   Specimen: Urine, Random  Result Value Ref Range Status   Specimen Description URINE, RANDOM  Final   Special Requests   Final    NONE Performed at [facility], [place] [address], [place], [place] [address]    Culture MULTIPLE SPECIES PRESENT, SUGGEST RECOLLECTION (A)  Final   Report Status 04/26/2019 FINAL  Final  SARS CORONAVIRUS 2 (TAT 6-24 HRS) Nasopharyngeal Nasopharyngeal Swab     Status: None   Collection Time: 04/25/19  9:44 PM   Specimen: Nasopharyngeal Swab  Result Value Ref Range Status   SARS Coronavirus 2 NEGATIVE NEGATIVE Final    Comment: (NOTE) SARS-CoV-2 target nucleic acids are NOT DETECTED. The SARS-CoV-2 RNA is generally detectable in upper and lower respiratory specimens during the acute phase of infection. Negative results do not preclude SARS-CoV-2 infection, do not rule out co-infections with other pathogens, and should not be used as the sole basis for treatment or other patient management decisions. Negative results must be combined with clinical observations, patient history, and epidemiological information. The expected result is Negative. Fact Sheet for Patients: SugarRoll.be Fact Sheet for Healthcare Providers: https://www.woods-mathews.com/ This test is not yet approved or cleared by the [place] FDA and  has been authorized for detection and/or diagnosis of SARS-CoV-2 by FDA under  an Emergency Use Authorization (EUA). This EUA will remain  in effect (meaning this test can be used) for the duration of the COVID-19 declaration under Section 56 4(b)(1) of the Act, 21 U.S.C. section 360bbb-3(b)(1), unless the authorization is terminated or revoked sooner. Performed at [facility], [place] [address] [place], [place] [address]      Labs: BNP (last 3 results) No results for input(s): BNP in the last 8760 hours. Basic Metabolic Panel: Recent Labs  Lab 04/25/19 1251 04/25/19 2234 04/26/19 0755 04/27/19 0307  NA 140  --  141 140  K 3.5  --  3.6 3.8  CL 109  --  112* 111  CO2 21*  --  20* 20*  GLUCOSE 103*  --  103* 109*  BUN 9  --  5* 8  CREATININE 1.00  --  0.95 1.10*  CALCIUM 9.1  --  8.7* 8.4*  MG  --  1.7  --   --    Liver Function Tests: Recent Labs  Lab 04/25/19 1406  AST 30  ALT 20  ALKPHOS 51  BILITOT 0.6  PROT 5.7*  ALBUMIN 3.0*   Recent Labs  Lab 04/25/19 1406  LIPASE 23   No results for input(s): AMMONIA in the last 168 hours. CBC: Recent Labs  Lab 04/25/19 1251 04/26/19 0259 04/27/19 0307  WBC 8.2 7.9 6.5  HGB 14.2 13.0 11.3*  HCT  42.3 37.7 33.6*  MCV 88.9 88.9 88.2  PLT 290 258 244   Cardiac Enzymes: No results for input(s): CKTOTAL, CKMB, CKMBINDEX, TROPONINI in the last 168 hours. BNP: Invalid input(s): POCBNP CBG: No results for input(s): GLUCAP in the last 168 hours. D-Dimer No results for input(s): DDIMER in the last 72 hours. Hgb A1c Recent Labs    04/26/19 0259  HGBA1C 5.5   Lipid Profile No results for input(s): CHOL, HDL, LDLCALC, TRIG, CHOLHDL, LDLDIRECT in the last 72 hours. Thyroid function studies No results for input(s): TSH, T4TOTAL, T3FREE, THYROIDAB in the last 72 hours.  Invalid input(s): FREET3 Anemia work up No results for input(s): VITAMINB12, FOLATE, FERRITIN, TIBC, IRON, RETICCTPCT in the last 72 hours. Urinalysis    Component Value Date/Time   COLORURINE COLORLESS (A)  04/25/2019 1900   APPEARANCEUR CLEAR 04/25/2019 1900   LABSPEC 1.005 04/25/2019 1900   PHURINE 7.0 04/25/2019 1900   GLUCOSEU NEGATIVE 04/25/2019 1900   HGBUR NEGATIVE 04/25/2019 1900   BILIRUBINUR NEGATIVE 04/25/2019 1900   KETONESUR 5 (A) 04/25/2019 1900   PROTEINUR NEGATIVE 04/25/2019 1900   NITRITE NEGATIVE 04/25/2019 1900   LEUKOCYTESUR NEGATIVE 04/25/2019 1900   Sepsis Labs Invalid input(s): PROCALCITONIN,  WBC,  LACTICIDVEN Microbiology Recent Results (from the past 240 hour(s))  Culture, blood (routine x 2)     Status: None (Preliminary result)   Collection Time: 04/25/19  2:30 PM   Specimen: BLOOD LEFT HAND  Result Value Ref Range Status   Specimen Description BLOOD LEFT HAND  Final   Special Requests   Final    BOTTLES DRAWN AEROBIC ONLY Blood Culture results may not be optimal due to an inadequate volume of blood received in culture bottles   Culture   Final    NO GROWTH 2 DAYS Performed at [facility], [place] [address], [place], [place] [address]    Report Status PENDING  Incomplete  Culture, blood (routine x 2)     Status: None (Preliminary result)   Collection Time: 04/25/19  4:57 PM   Specimen: BLOOD LEFT WRIST  Result Value Ref Range Status   Specimen Description BLOOD LEFT WRIST  Final   Special Requests   Final    BOTTLES DRAWN AEROBIC AND ANAEROBIC Blood Culture results may not be optimal due to an inadequate volume of blood received in culture bottles   Culture   Final    NO GROWTH 2 DAYS Performed at [facility], [place] [address], [place], [place] [address]    Report Status PENDING  Incomplete  Urine culture     Status: Abnormal   Collection Time: 04/25/19  7:04 PM   Specimen: Urine, Random  Result Value Ref Range Status   Specimen Description URINE, RANDOM  Final   Special Requests   Final    NONE Performed at [facility], [place] [address], [place],  [address]    Culture MULTIPLE SPECIES PRESENT, SUGGEST RECOLLECTION (A)   Final   Report Status 04/26/2019 FINAL  Final  SARS CORONAVIRUS 2 (TAT 6-24 HRS) Nasopharyngeal Nasopharyngeal Swab     Status: None   Collection Time: 04/25/19  9:44 PM   Specimen: Nasopharyngeal Swab  Result Value Ref Range Status   SARS Coronavirus 2 NEGATIVE NEGATIVE Final    Comment: (NOTE) SARS-CoV-2 target nucleic acids are NOT DETECTED. The SARS-CoV-2 RNA is generally detectable in upper and lower respiratory specimens during the acute phase of infection. Negative results do not preclude SARS-CoV-2 infection,  do not rule out co-infections with other pathogens, and should not be used as the sole basis for treatment or other patient management decisions. Negative results must be combined with clinical observations, patient history, and epidemiological information. The expected result is Negative. Fact Sheet for Patients: SugarRoll.be Fact Sheet for Healthcare Providers: https://www.woods-mathews.com/ This test is not yet approved or cleared by the [place] FDA and  has been authorized for detection and/or diagnosis of SARS-CoV-2 by FDA under an Emergency Use Authorization (EUA). This EUA will remain  in effect (meaning this test can be used) for the duration of the COVID-19 declaration under Section 56 4(b)(1) of the Act, 21 U.S.C. section 360bbb-3(b)(1), unless the authorization is terminated or revoked sooner. Performed at [facility], [place] [address] [place], [place] [address]      Time coordinating discharge: 32 minutes  SIGNED:   Hosie Poisson, MD  [facility] 04/28/2019, 8:04 AM Pager   If 7PM-7AM, please contact night-coverage [email or web address] Password [email or web address]

## 2019-04-30 LAB — CULTURE, BLOOD (ROUTINE X 2)
Culture: NO GROWTH
Culture: NO GROWTH
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
## 2019-05-17 ENCOUNTER — Emergency Department (HOSPITAL_COMMUNITY)
Admission: EM | Admit: 2019-05-17 | Discharge: 2019-05-17 | Disposition: A | Discharge status: Home / Self Care | Payer: Medicare Other | Source: Home / Self Care | Attending: Emergency Medicine | Admitting: Emergency Medicine

## 2019-05-17 ENCOUNTER — Telehealth: Payer: Self-pay | Admitting: Internal Medicine

## 2019-05-17 ENCOUNTER — Encounter: Payer: Self-pay | Admitting: Physician Assistant

## 2019-05-17 ENCOUNTER — Ambulatory Visit: Payer: Medicare Other | Admitting: Internal Medicine

## 2019-05-17 ENCOUNTER — Other Ambulatory Visit: Payer: Self-pay

## 2019-05-17 ENCOUNTER — Emergency Department (HOSPITAL_COMMUNITY)
Admission: EM | Admit: 2019-05-17 | Discharge: 2019-05-17 | Disposition: A | Discharge status: Home / Self Care | Payer: Medicare Other | Attending: Emergency Medicine | Admitting: Emergency Medicine

## 2019-05-17 DIAGNOSIS — T7840XA Allergy, unspecified, initial encounter: Secondary | ICD-10-CM

## 2019-05-17 DIAGNOSIS — Z789 Other specified health status: Secondary | ICD-10-CM | POA: Insufficient documentation

## 2019-05-17 DIAGNOSIS — Z7982 Long term (current) use of aspirin: Secondary | ICD-10-CM | POA: Insufficient documentation

## 2019-05-17 DIAGNOSIS — R202 Paresthesia of skin: Secondary | ICD-10-CM | POA: Diagnosis present

## 2019-05-17 DIAGNOSIS — Z79899 Other long term (current) drug therapy: Secondary | ICD-10-CM | POA: Insufficient documentation

## 2019-05-17 DIAGNOSIS — J45909 Unspecified asthma, uncomplicated: Secondary | ICD-10-CM | POA: Diagnosis not present

## 2019-05-17 DIAGNOSIS — I252 Old myocardial infarction: Secondary | ICD-10-CM | POA: Insufficient documentation

## 2019-05-17 DIAGNOSIS — N182 Chronic kidney disease, stage 2 (mild): Secondary | ICD-10-CM | POA: Insufficient documentation

## 2019-05-17 DIAGNOSIS — Z91040 Latex allergy status: Secondary | ICD-10-CM | POA: Insufficient documentation

## 2019-05-17 DIAGNOSIS — L299 Pruritus, unspecified: Secondary | ICD-10-CM | POA: Insufficient documentation

## 2019-05-17 DIAGNOSIS — Z20822 Contact with and (suspected) exposure to covid-19: Secondary | ICD-10-CM

## 2019-05-17 DIAGNOSIS — I129 Hypertensive chronic kidney disease with stage 1 through stage 4 chronic kidney disease, or unspecified chronic kidney disease: Secondary | ICD-10-CM | POA: Diagnosis not present

## 2019-05-17 DIAGNOSIS — I2510 Atherosclerotic heart disease of native coronary artery without angina pectoris: Secondary | ICD-10-CM | POA: Insufficient documentation

## 2019-05-17 DIAGNOSIS — Z03818 Encounter for observation for suspected exposure to other biological agents ruled out: Secondary | ICD-10-CM

## 2019-05-17 MED ORDER — METHYLPREDNISOLONE ACETATE 40 MG/ML IJ SUSP
10.0000 mg | Freq: Once | INTRAMUSCULAR | Status: AC
  Administered 2019-05-17: 09:00:00 10 mg via INTRAMUSCULAR
  Filled 2019-05-17: qty 1

## 2019-05-17 MED ORDER — HYDROXYZINE HCL 50 MG/ML IM SOLN
100.0000 mg | Freq: Once | INTRAMUSCULAR | Status: AC
  Administered 2019-05-17: 22:00:00 100 mg via INTRAMUSCULAR
  Filled 2019-05-17: qty 2

## 2019-05-17 NOTE — Telephone Encounter (Signed)
LMOMTCB x 1 
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
## 2019-05-17 NOTE — Telephone Encounter (Signed)
Pt returning call.  [phone].  Pt also now having diarrhea.  No test done in the ER.

## 2019-05-17 NOTE — ED Notes (Signed)
Pt refuses V/S due to having a reaction and feeling better. Pt does not want the reaction to come back and states she is allergic to latex.

## 2019-05-17 NOTE — Telephone Encounter (Signed)
Called the patient back and confirmed she was still in the ER and in room #43 waiting to be brought back (note from hospital has not been completed as of time stamp) and has had a similar reaction before to the corn filler in steroid. Patient stated she went to [facility] in clinic yesterday and issued a steroid due to exposure to poison ivy. Feels like a chemical burn in her throat and mouth.  I asked the patient if she contacted [facility] to make them aware of the reaction she had, patient stated no. I advised her that while she is in the ER they will assess her and give her the treatment needed in order for the reaction she is having to resolve. Before I could ask her any more questions the patient stated they were taking her back and hung up.  Patient has not been seen here in clinic since 11/05/18. No appointment made for any injections as she is currently in the ER receiving assessment and treatment.

## 2019-05-17 NOTE — ED Notes (Signed)
Patient verbalizes understanding of discharge instructions. Opportunity for questioning and answers were provided. Pt discharged from ED. 

## 2019-05-17 NOTE — ED Notes (Addendum)
Pt here after having allergic reaction to prednisone that she was given yesterday after having poison ivy. States that she cannot have anything "that has a corn filler in it" Pt states "I usually have to get a liquid injection" [facility]. Pt complains of itching to face and generalized rash. Pt states that she took liquid capsule of benadryl last night after "my throat started closing up so that I wouldn't die" Dr Jeanell Sparrow in room with pt and this RN for full body assessment. Called EMS this morning because she had ran out of benadryl. Last benadryl was last night around 1800.
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
## 2019-05-17 NOTE — Discharge Instructions (Addendum)
Please get your usual benadryl on the way home. Return if worsening difficult breathing Recheck with Dr. [name] this week

## 2019-05-17 NOTE — Telephone Encounter (Signed)
That is a primary care issue, needs to be addressed by provider who put her on steroid for poison ivy. The ER can provide her the care she needs.

## 2019-05-17 NOTE — Telephone Encounter (Signed)
Call returned to patient, she reports she was recently seen in ED for steroid adverse reaction. She was instructed to take benadryl and they gave her an injection in her arm. She reports while leaving she feels like she was having a reaction to the air coming out of the a/c system. She reports her face and neck is red and irritated. She is asking is there some type of blood test to find out what she is allergic too. She denies any respiratory distress. She reports her face and neck is burning. She reports her eyes are irritated. She reports the inside of her mouth and throat is not as bad but it is still burning. She said it feels like a chemical burn. She reports she does not understand what is going on with her and she is very concerned that she is taking something that she is allergic to. She is requesting recommendations from CY. When she was seen at [facility] clinic they gave her prednisone which triggered this adverse reaction. She reports in the past she had to get all her steroids from [facility] [address] because they have the prednisone without the corn filler.    CY please advise, patient requesting what else can be done to address her symptoms. Taking benadryl as directed by ED. Aware she has an appt 10/15 however the burning sensation continues and seemed to worsen as she was leaving the ED. Can access ED notes in epic. Thanks.

## 2019-05-17 NOTE — Telephone Encounter (Signed)
Please see other phone note dated for today for continued documentation.

## 2019-05-17 NOTE — Telephone Encounter (Signed)
Called and spoke with pt. Pt states she has taken a shower multiple times since she got home from the ER. She reports experiencing severe diarrhea about 1 hour ago and has not had a COVID test done. She further notes her spouse was sick with diarrhea 1 week ago that cleared up within three days. Pt expressed concern that she may have COVID and is requesting recommendations. I informed pt of her options to either get the COVID test done at the [place] or to return to the ER for COVID testing; however, instructed her to not take any action until we hear from [place]. Pt is aware to socially isolate, practice social distancing, hang hygiene, masking, etc.   CY, please advise with your recommendations. Pt is aware of the labs you recommended; however, we may need to order a COVID test first as a precautionary measure. Thank you.

## 2019-05-17 NOTE — Telephone Encounter (Signed)
Suggest Covid swab at [facility]

## 2019-05-17 NOTE — Telephone Encounter (Signed)
Message kept in triage as FYI in case patient calls back today or tomorrow with appointment request.

## 2019-05-17 NOTE — Discharge Instructions (Addendum)
Take your medicines as prescribed.  You can use Benadryl for itching.  Follow-up with your doctor

## 2019-05-17 NOTE — Telephone Encounter (Signed)
This may be an irritant exposure, rather than true allergy with antibodies, It might help to take a bath or shower to clear her skin. A drying antihistamine like diphenhydramine (Benadryl) or Chlortrimeton may help.  I don't have any way to test for this, except we can order outpatient lab for CBC w diff, IgE , ESR(sedimentation rate) and CRP (C-reactive protein) to see if her immune system has been activated. For dx of allergic reaction.

## 2019-05-17 NOTE — ED Triage Notes (Signed)
Per EMS, from home called out for allergic reactions.  Pt had poision ivy, went to [facility] and was prescribed prednisone.  She now says she is having a allergic reaction, no hives, wheezing or other symptoms other than itching.  Pt did stop taking all her medications two weeks ago b/c she was prescribed two antibiotics and did not want them to have a reaction.

## 2019-05-17 NOTE — ED Triage Notes (Signed)
PT was seen at [facility] earlier today for an allergic reaction. Pt was discharged and is back again for same. She stated that her skin is burning and she needs to put water on it to help. Pt also stated that she wants Morphine so she can "leave this world."

## 2019-05-17 NOTE — Telephone Encounter (Signed)
Spoke with patient Let her know to go get covid tested at [facility]. Patient asked about lab work.  I let her know she couldn't come have labs drawn until she has a negative result for covid.  I also let her know her visit on Thursday would be changed to a telephone visit, as she would be waiting results for her covid test. I instructed her if she received the results before OV to call us and we could still do in office visit and should could get lab work done.  Patient voiced understanding. Nothing further needed at this time

## 2019-05-17 NOTE — ED Notes (Signed)
EMS was told by pt that she "does not want to be here anymore." SHe wanted morphine so "she doesn't have to be here anymore. Husband called and confirmed that the pt wants to harm herself. MD notified.
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
## 2019-05-18 ENCOUNTER — Other Ambulatory Visit: Payer: Self-pay

## 2019-05-18 DIAGNOSIS — Z20822 Contact with and (suspected) exposure to covid-19: Secondary | ICD-10-CM

## 2019-05-19 ENCOUNTER — Encounter: Payer: Self-pay | Admitting: Internal Medicine

## 2019-05-19 ENCOUNTER — Other Ambulatory Visit: Payer: Self-pay

## 2019-05-19 ENCOUNTER — Telehealth (INDEPENDENT_AMBULATORY_CARE_PROVIDER_SITE_OTHER): Payer: Medicare Other | Admitting: Physician Assistant

## 2019-05-19 ENCOUNTER — Ambulatory Visit (INDEPENDENT_AMBULATORY_CARE_PROVIDER_SITE_OTHER): Payer: Medicare Other | Admitting: Internal Medicine

## 2019-05-19 ENCOUNTER — Encounter: Payer: Self-pay | Admitting: Physician Assistant

## 2019-05-19 VITALS — Ht 66.0 in | Wt 197.0 lb

## 2019-05-19 DIAGNOSIS — N182 Chronic kidney disease, stage 2 (mild): Secondary | ICD-10-CM

## 2019-05-19 DIAGNOSIS — I2584 Coronary atherosclerosis due to calcified coronary lesion: Secondary | ICD-10-CM | POA: Diagnosis not present

## 2019-05-19 DIAGNOSIS — I10 Essential (primary) hypertension: Secondary | ICD-10-CM

## 2019-05-19 DIAGNOSIS — I2510 Atherosclerotic heart disease of native coronary artery without angina pectoris: Secondary | ICD-10-CM

## 2019-05-19 DIAGNOSIS — K9049 Malabsorption due to intolerance, not elsewhere classified: Secondary | ICD-10-CM

## 2019-05-19 DIAGNOSIS — E785 Hyperlipidemia, unspecified: Secondary | ICD-10-CM

## 2019-05-19 DIAGNOSIS — I959 Hypotension, unspecified: Secondary | ICD-10-CM

## 2019-05-19 LAB — NOVEL CORONAVIRUS, NAA: SARS-CoV-2, NAA: NOT DETECTED

## 2019-05-19 MED ORDER — FELODIPINE ER 10 MG PO TB24: 10.0000 mg | ORAL_TABLET | Freq: Every day | ORAL | 3 refills | Status: DC

## 2019-05-19 NOTE — Progress Notes (Signed)
HPI female never smoker followed for bronchitis, allergic rhinitis, chronic insomnia, "intolerance of all corn products", complicated by GERD and multiple medical problems, CAD/MI/ stent Seroquel made her too dizzy. Belsomra 15 mg made her muscles stiff and left her groggy but did help her sleep. Rozerem did not help with sleep but made her dizzy She again requests prescription for zolpidem 10 mg, insisting that nothing else has worked as well to help her manage chronic insomnia. She limits foods with magnesium which she says make her feel hot She tells me she is allergic to nickel, gold and polyester "all of which conduct electricity". Therefore she wants a letter for her to present to [employer] asking them to change her electric meter to a "noncommunicating electric meter", so that [employer] will stop sending radio waves through her home. She prefers a manual meter check.  ------------------------------------------------------------------------------------------   11/05/2018- Virtual Visit via Telephone Note History of Present Illness: 77 yoF known to me with hx of allergic rhinitis, chronic bronchitis, CAD/ MI and intolerance to corn products.  Acute illness- 1 week- cough with scant, clear mucus, body aches, some increased SOB.  About 3 weeks ago she sat and walked in church with a woman who has since been hosp with confirmed Covid  She can't see well enough to read her mercury thermometer. Possible 1 degree temp elevation. No chills or GI upset. She asks to have on hand an antibiotic and cough syrup she took last fall. Observations/Objective: Vital Sxs not available. I note that her manner in conversation was calm and unhurried with unlabored breathing and no coughing heard. Assessment and Plan: Her story is consistent with mild Covid-19 syndrome, but non-specific.  Advise- supportive care, self-quarantine x 2 weeks, go to ER if progressive. I agreed to let her have augmentin and Robitussin  AC cough syrup on hand. Follow Up Instructions:    05/19/2019- Virtual Visit via Telephone Note  I connected with [name] on 05/19/19 at  2:30 PM EDT by telephone and verified that I am speaking with the correct person using two identifiers.  Location: Patient: H Provider: O   I discussed the limitations, risks, security and privacy concerns of performing an evaluation and management service by telephone and the availability of in person appointments. I also discussed with the patient that there may be a patient responsible charge related to this service. The patient expressed understanding and agreed to proceed.   History of Present Illness: 89 yoF known to me with hx of allergic rhinitis, chronic bronchitis, CAD/ MI/ stent and intolerance to corn products. Obesity, Hyperlipidemia Hosp admit  9/21-9/23- NSTEMI    Hx MI and stent in April. ED 10/13- c/o itching in legs, attributed to steroid injection. Rx'd with Vistaril She had concern about possible covid symptoms and was sent to testing yesterday, results pending. -----pt got tested for COVID yesterday 05/18/2019; states she is feeling better today She has been dx'd with 2 MI's this year, followed by cardiology. ER twice yesterday related to itching legs she blamed on itching after steroid injection on Monday. Steroid made her hyper. Vistaril inj helped a lot and she asked for po equivalent to have available.  Ok to get flu vax as planned.  Observations/Objective: IgE and CBC diff are peninding  Assessment and Plan: Allergy- suggest trying chlor-Trimeton if pharmacy can get her a liquid with no corn filler.  CAD- MI x 2- follow with cardiology Follow Up Instructions: 6 months   I discussed the  assessment and treatment plan with the patient. The patient was provided an opportunity to ask questions and all were answered. The patient agreed with the plan and demonstrated an understanding of the instructions.   The  patient was advised to call back or seek an in-person evaluation if the symptoms worsen or if the condition fails to improve as anticipated.  I provided 21 minutes of non-face-to-face time during this encounter.   Baird Lyons, MD

## 2019-05-19 NOTE — Assessment & Plan Note (Signed)
Multiple intolerances. She can't distinguish between common side effects and important intolerance/ allergy to wide variety of medications and environmental exposures. Probably some of this is anxiety related.  Plan- work with antihistamines like Chlor-trimeton. Avoid known triggers.  Check for stimulation of allergic response by getting eosinophil and IgE counts when feeling stable and away from steroids.

## 2019-05-19 NOTE — Patient Instructions (Signed)
Ok to come as planned for flu shot, unless your covid test comes back positive.  Wait a couple of weeks after the recent steroid shot and pills, before getting the blood tests we ordered looking at allergy markers. If these are elevated, they will let us inow that the allergy arm of your immune system is keyed up, but won't tell us specifically what you might bee reacting to.  We suggested you might ask your pharmacist if they have a Chlor-Trimeton antihistamine product in liquid form, sort of like the Benadryl, so it won't have fillers in it.  Please call if we can help.

## 2019-05-19 NOTE — Patient Instructions (Signed)
Medication Instructions:  Your physician has recommended you make the following change in your medication:  1.  START the Felodipine at 1/2 tablet for a few days then increase to a whole tablet if your blood pressure is still high.  *If you need a refill on your cardiac medications before your next appointment, please call your pharmacy*  Lab Work: None ordered  If you have labs (blood work) drawn today and your tests are completely normal, you will receive your results only by: Marland Kitchen MyChart Message (if you have MyChart) OR . A paper copy in the mail If you have any lab test that is abnormal or we need to change your treatment, we will call you to review the results.  Testing/Procedures: None ordered  Follow-Up: At [facility], you and your health needs are our priority.  As part of our continuing mission to provide you with exceptional heart care, we have created designated Provider Care Teams.  These Care Teams include your primary Cardiologist (physician) and Advanced Practice Providers (APPs -  Physician Assistants and Nurse Practitioners) who all work together to provide you with the care you need, when you need it.  Your next appointment:   4 WEEKS VIRTUAL   The format for your next appointment:   Virtual Visit VIA PHONE [place] [id number]  Provider:   Melina Copa, PA-C     TELEPHONE CALL NOTE  This patient has been deemed a candidate for follow-up tele-health visit to limit community exposure during the Covid-19 pandemic. I spoke with the patient via phone to discuss instructions. This has been outlined on the patient's AVS (dotphrase: [email or web address]). The patient was advised to review the section on consent for treatment as well. The patient will receive a phone call 2-3 days prior to their E-Visit at which time consent will be verbally confirmed.   A Virtual Office Visit appointment type has been scheduled for [employer] with Melina Copa, PA-C , with "VIDEO" or  "TELEPHONE" in the appointment notes - patient prefers PHONE type.  I have either confirmed the patient is active in MyChart or offered to send sign-up link to phone/email via Mychart icon beside patient's photo.PT DECLINES [facility] AND GAVE VERBAL PERMISSION FOR THE VIRTUAL APPOINTMENT  Jeanann Lewandowsky, [place] 05/19/2019 11:42 AM

## 2019-05-19 NOTE — Assessment & Plan Note (Signed)
MI x 2 this year.  Needs to continue very close f/u with cardiology

## 2019-05-20 ENCOUNTER — Other Ambulatory Visit: Payer: Self-pay | Admitting: Internal Medicine

## 2019-05-20 DIAGNOSIS — T7840XD Allergy, unspecified, subsequent encounter: Secondary | ICD-10-CM

## 2019-05-25 DIAGNOSIS — M79671 Pain in right foot: Secondary | ICD-10-CM | POA: Insufficient documentation

## 2019-05-26 ENCOUNTER — Ambulatory Visit (INDEPENDENT_AMBULATORY_CARE_PROVIDER_SITE_OTHER): Payer: Medicare Other

## 2019-05-26 ENCOUNTER — Ambulatory Visit: Payer: Medicare Other | Admitting: Internal Medicine

## 2019-05-26 ENCOUNTER — Other Ambulatory Visit (INDEPENDENT_AMBULATORY_CARE_PROVIDER_SITE_OTHER): Payer: Medicare Other

## 2019-05-26 ENCOUNTER — Other Ambulatory Visit: Payer: Self-pay

## 2019-05-26 DIAGNOSIS — Z23 Encounter for immunization: Secondary | ICD-10-CM

## 2019-05-26 DIAGNOSIS — T7840XD Allergy, unspecified, subsequent encounter: Secondary | ICD-10-CM

## 2019-05-26 LAB — SEDIMENTATION RATE: Sed Rate: 32 mm/hr — ABNORMAL HIGH (ref 0–30)

## 2019-05-26 LAB — C-REACTIVE PROTEIN: CRP: <1 mg/dL (ref 0.5–20.0)

## 2019-05-27 LAB — IGE: IgE (Immunoglobulin E), Serum: 6 kU/L (ref <=114)

## 2019-05-30 ENCOUNTER — Other Ambulatory Visit: Payer: Self-pay | Admitting: Internal Medicine

## 2019-05-30 ENCOUNTER — Encounter: Payer: Self-pay | Admitting: Internal Medicine

## 2019-05-30 NOTE — Telephone Encounter (Signed)
Cough syrup refill e-sent 

## 2019-05-30 NOTE — Telephone Encounter (Signed)
Rx Request for guaifenesin-codeine syrup. Pt last seen 05/19/2019 via telephone visit with CY. Guaifenesin-codeine 100-10 mg/33mL syrup last filled 04/07/2019 120 mL x0 refills.   Current Outpatient Medications on File Prior to Visit  Medication Sig Dispense Refill  . aspirin 81 MG chewable tablet Chew 1 tablet (81 mg total) by mouth daily. 30 tablet 11  . atorvastatin (LIPITOR) 10 MG tablet Take 1 tablet (10 mg total) by mouth daily. 90 tablet 1  . carvedilol (COREG) 6.25 MG tablet Take 1 tablet (6.25 mg total) by mouth 2 (two) times daily with a meal. 180 tablet 2  . Cyanocobalamin (VITAMIN B-12 IJ) Inject 1,000 mg as directed every 30 (thirty) days.    . diphenhydrAMINE (BENADRYL) 25 mg capsule Take 25 mg by mouth every 6 (six) hours as needed for itching or allergies.     Marland Kitchen esomeprazole (NEXIUM) 20 MG capsule Take 20 mg by mouth daily before breakfast.     . felodipine (PLENDIL) 10 MG 24 hr tablet Take 1 tablet (10 mg total) by mouth daily. 30 tablet 3  . furosemide (LASIX) 20 MG tablet Take 20 mg by mouth daily as needed for fluid.     Marland Kitchen guaiFENesin-codeine 100-10 MG/5ML syrup TAKE 5ML BY MOUTH 3 TIMES DAILY AS NEEDED FOR COUGH 120 mL 0  . MONUROL 3 g PACK Take 3 g by mouth See admin instructions. Mix 1 packet (3 grams) into 3-4 ounces of water drink once a day as needed for bladder or UTI infections- use as directed    . nitroGLYCERIN (NITROSTAT) 0.4 MG SL tablet Place 1 tablet (0.4 mg total) under the tongue every 5 (five) minutes as needed for chest pain. 25 tablet 2  . ondansetron (ZOFRAN) 4 MG tablet Take 1 tablet (4 mg total) by mouth every 8 (eight) hours as needed for nausea or vomiting. 4 tablet 0  . ticagrelor (BRILINTA) 60 MG TABS tablet Take 1 tablet (60 mg total) by mouth 2 (two) times daily. 180 tablet 3  . zolpidem (AMBIEN) 10 MG tablet Take 10 mg by mouth at bedtime as needed for sleep.     No current facility-administered medications on file prior to visit.    Allergies   Allergen Reactions  . Other Anaphylaxis, Itching, Rash and Other (See Comments)    Corn fillers, corn by-products - causes severe itching Polyester-"pins sticking in her skin   . Sulfa Antibiotics Anaphylaxis  . Sulfonamide Derivatives Anaphylaxis  . Ciprofloxacin Rash and Other (See Comments)  . Nitrofurantoin Diarrhea, Nausea And Vomiting and Other (See Comments)    Also, "convulsions/constant shaking"- per patient  . Penicillins Rash    Underarms (both) Has patient had a PCN reaction causing immediate rash, facial/tongue/throat swelling, SOB or lightheadedness with hypotension: YES Has patient had a PCN reaction causing severe rash involving mucus membranes or skin necrosis: NO Has patient had a PCN reaction that required hospitalization NO Has patient had a PCN reaction occurring within the last 10 years: NO If all of the above answers are "NO", then may proceed with Cephalosporin use. Other reaction(s): Other (See Comments) Underarms (both) Other Reaction: "red hot skin" Underarms (both) Has patient had a PCN reaction causing immediate rash, facial/tongue/throat swelling, SOB or lightheadedness with hypotension: YES Has patient had a PCN reaction causing severe rash involving mucus membranes or skin necrosis: NO Has patient had a PCN reaction that required hospitalization NO Has patient had a PCN reaction occurring within the last 10 years: NO If all of the  above answers are "NO", then may proceed with Cephalosporin use.  . Prednisone Itching    Pt states she cannot take prednisone with corn filler 05/19/2019.  . Cephalexin Other (See Comments)    Reaction not recalled by the patient  . Cephalosporins Itching    Other reaction(s): Other (See Comments) unknown  . Crestor [Rosuvastatin] Other (See Comments)    Lost all muscle mobility   . Doxycycline Diarrhea and Nausea And Vomiting    Other reaction(s): Other (See Comments)  . Formoterol Other (See Comments)    Reaction not  recalled  . Formoterol Fumarate Other (See Comments)    Reaction not recalled  . Gold Sodium Thiosulfate Other (See Comments)    Positive patch test  . Gold-Containing Drug Products Other (See Comments)    Skin tingles  . Levofloxacin In D5w Other (See Comments)    Other Reaction: racing heart Other reaction(s): Other (See Comments) Other Reaction: racing heart  . Macrodantin [Nitrofurantoin Macrocrystal] Itching  . Moxifloxacin Other (See Comments)    Caused hands to "shake"  . Neomycin Other (See Comments)    Doesn't remember - allergist said not to use it because it could add more allergies- Positive patch test   . Ofloxacin Itching  . Ranexa [Ranolazine Er]     itching  . Valsartan Other (See Comments)    Pt reports this med causes her to feel sluggish and she feels heaviness on her shoulders.  . Acetaminophen Rash and Other (See Comments)    Facial rash  . Fexofenadine Palpitations and Other (See Comments)    Heart races  . Ibuprofen Rash  . Latex Rash and Other (See Comments)    Skin gets red   . Levofloxacin Palpitations and Other (See Comments)    HEART RACING  . Mold Extract [Trichophyton Mentagrophyte] Other (See Comments)    Bumps on back, stops up sinuses.  . Molds & Smuts Rash and Other (See Comments)    Bumps on back, stops up sinuses.  . Nickel Rash  . Tape Rash  . Trichophyton Rash and Other (See Comments)    Bumps on back, stops up sinuses    CY, please advise on the refill of this medication. Thank you.

## 2019-06-02 IMAGING — CR DG CHEST 2V
2 series · 2 of 2 positions shown · non-contrast
Comparison: Chest radiograph 09/09/2017

CLINICAL DATA: Productive cough

EXAM:
CHEST  2 VIEW

[chest lat]
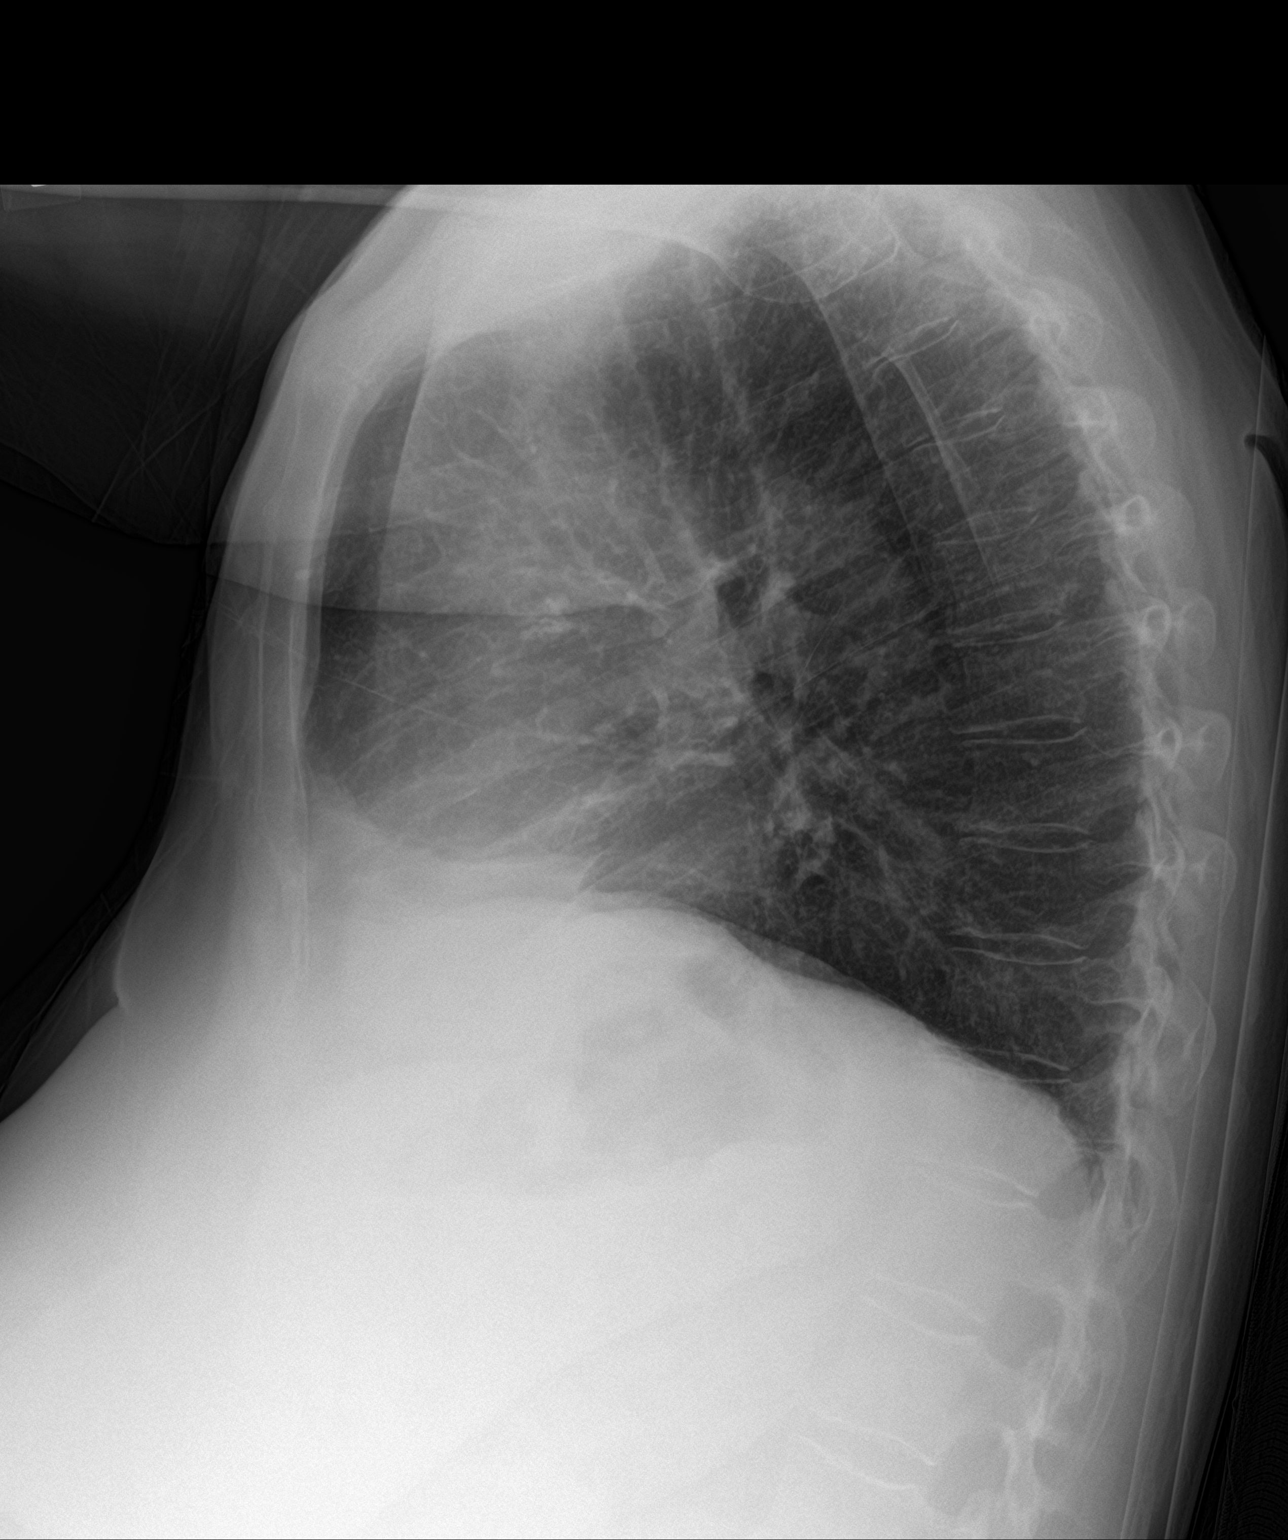

[chest ap]
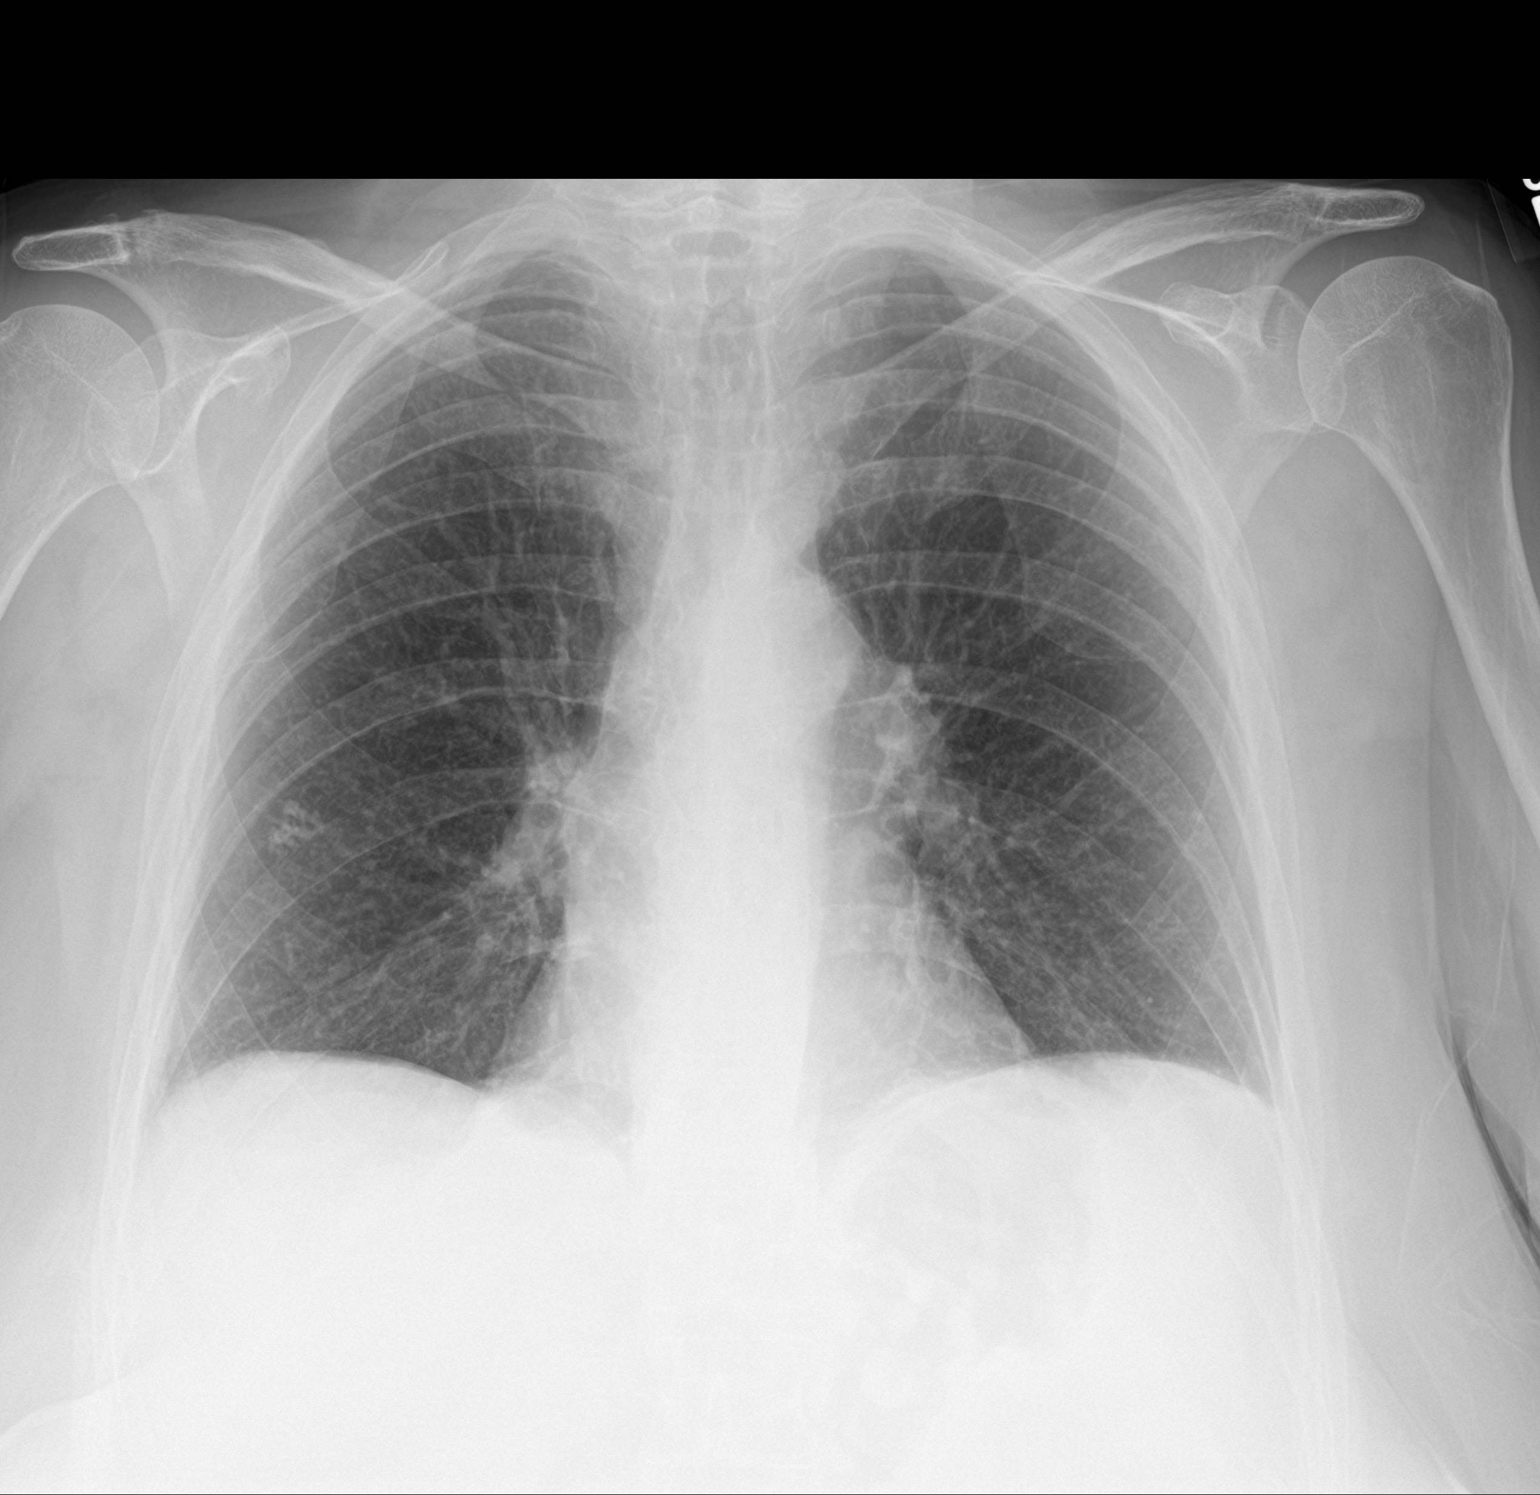

[2 of 2 positions shown; findings below may reference images not displayed]

FINDINGS: Unchanged right mid lung opacities. The area opacity in the lingula
has cleared. No focal airspace consolidation or pulmonary edema. No
pleural effusion or pneumothorax. Normal cardiomediastinal contours.
IMPRESSION: No active cardiopulmonary disease.

## 2019-06-03 ENCOUNTER — Ambulatory Visit: Payer: Medicare Other | Admitting: Physician Assistant

## 2019-06-06 ENCOUNTER — Telehealth: Payer: Self-pay | Admitting: Internal Medicine

## 2019-06-06 NOTE — Telephone Encounter (Signed)
Called spoke with patient who reports that she developed a fine red rash with itching from her neck to her trunk last week.  Then yesterday afternoon she devleoped some welps around her waist similar to a bee stings. She has taken benadryl, hydroxyzine, calamine lotion.  She has not been able to get the liquid chlor-trimeton.  She is unsure of the cause - "it's another unknown thing"  She would like to know if Donna Bailey would be kind enough to see if she can get in with someone at [facility] that is "really good" and to his "calliber."  Last seen 10.15.2020 by Donna Bailey: Assessment and Plan: Allergy- suggest trying chlor-Trimeton if pharmacy can get her a liquid with no corn filler.  CAD- MI x 2- follow with cardiology Follow Up Instructions: 6 months   Donna Bailey please advise, thank you.

## 2019-06-06 NOTE — Telephone Encounter (Signed)
Donna Bailey is not connected with the [employer] or [place] - they just have the same family name. She can call there and make an appointment with someone who can see her while she still has this rash. The alternative [facility] group in town is [facility], which IS part of the [facility]. They are on [facility] [address] [facility], right across from [facility].

## 2019-06-06 NOTE — Telephone Encounter (Signed)
Called spoke with patient and discussed Dr Janee Morn recommendations Patient would like to go with [facility] since they are within the [place]  Phone number for [facility] provided to patient Nothing further needed at this time; will sign off

## 2019-06-09 ENCOUNTER — Encounter: Payer: Self-pay | Admitting: Allergy

## 2019-06-09 ENCOUNTER — Ambulatory Visit: Payer: Self-pay | Admitting: Allergy & Immunology

## 2019-06-09 ENCOUNTER — Ambulatory Visit (INDEPENDENT_AMBULATORY_CARE_PROVIDER_SITE_OTHER): Payer: Medicare Other | Admitting: Allergy

## 2019-06-09 ENCOUNTER — Other Ambulatory Visit: Payer: Self-pay

## 2019-06-09 VITALS — BP 128/90 | HR 93 | Temp 97.0°F | Resp 18 | Ht 66.5 in | Wt 191.4 lb

## 2019-06-09 DIAGNOSIS — Z889 Allergy status to unspecified drugs, medicaments and biological substances status: Secondary | ICD-10-CM | POA: Diagnosis not present

## 2019-06-09 DIAGNOSIS — J310 Chronic rhinitis: Secondary | ICD-10-CM | POA: Insufficient documentation

## 2019-06-09 DIAGNOSIS — T781XXD Other adverse food reactions, not elsewhere classified, subsequent encounter: Secondary | ICD-10-CM | POA: Diagnosis not present

## 2019-06-09 DIAGNOSIS — T7840XA Allergy, unspecified, initial encounter: Secondary | ICD-10-CM | POA: Insufficient documentation

## 2019-06-09 DIAGNOSIS — T781XXA Other adverse food reactions, not elsewhere classified, initial encounter: Secondary | ICD-10-CM | POA: Insufficient documentation

## 2019-06-09 DIAGNOSIS — T7840XD Allergy, unspecified, subsequent encounter: Secondary | ICD-10-CM

## 2019-06-09 NOTE — Assessment & Plan Note (Signed)
Currently avoiding foods containing corn. She was seen by an integrative provider who apparently did some type of food testing and was told she should avoid the following: barley yeast, yogurt, whey and swiss cheese. She eats limited dairy and limited soy products.   Will check for basic food panel via bloodwork.   Continue to avoid foods that bother her.

## 2019-06-09 NOTE — Assessment & Plan Note (Signed)
Patient was on allergy immunotherapy in the past with no benefit.  Monitor symptoms. Will check environmental allergies via bloodwork.

## 2019-06-09 NOTE — Assessment & Plan Note (Signed)
   See assessment and plan as above.  Continue to avoid drugs that are bothersome.

## 2019-06-09 NOTE — Assessment & Plan Note (Signed)
Patient with a complicated medical history (obesity, asthma, allergic rhinitis, GERD, DJD, fibromyalgia, insomnia, hyperlipidemia, ischemic CMO, CAD) who has history of frequent "allergic reactions" in the form of rash and pruritis. These episodes seem to be triggered mainly by medications especially "corn fillers" and unknown chemicals. She is apparently very sensitive to metals and polyester. Most recently she broke out in rash on her abdominal area which is improving since she stopped taking Nexium and applying topical triamcinolone. She had flu vaccine on 05/26/2019 and ER visit on 05/17/2019 for allergic reaction. Denies any changes in diet, personal care products or recent infections. Since the 05/17/2019 episode, she stopped all her prescribed medications and only taking some OTC [facility] brand antacid. She was followed closely by Dr. [name] (pulmonology) for many years for her allergic reactions.   Discussed at length with patient that there is no FDA approved bloodwork which shows what drugs she may or may not be allergic to. There are some non-approved testing out there which she would have to pay out of pocket for and its utility for predicting clinical reactions is unknown at this time therefore, I do not recommend it.   However, based on her clinical history concerned for possible mast cell disorder and will get some bloodwork to further investigate this.   Some of her reactions may be more intolerances and side effects of medications rather than a true IgE mediated reaction. There are no tests to check for intolerances or side effects - these are based on clinical history.  . Get bloodwork and urine test.  . If you have another reaction, take pictures and let us know and get a tryptase level within 2-3 hours of reaction - this will let us know if the reaction is IgE mediated or not.   Continue to avoid drugs that bother you.  For mild symptoms you can take over the counter antihistamines such as  Benadryl and monitor symptoms closely. If symptoms worsen or if you have severe symptoms including breathing issues, throat closure, significant swelling, whole body hives, severe diarrhea and vomiting, lightheadedness then seek immediate medical care.  Bring in all the pills that you take or supposed to take at the next visit.   Please call your physicians about which medications you need to be on.   Will consider starting her on a daily antihistamine she can tolerate if still has persistent rash/pruritus.

## 2019-06-09 NOTE — Progress Notes (Signed)
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 reviewed.  The plan was reviewed with the patient/family, and all questions/concerned were addressed.  It was my pleasure to see Petronella today and participate in her care. Please feel free to contact me with any questions or concerns.  Sincerely,  Rexene Alberts, DO Allergy & Immunology  [facility] office: [phone] [facility] office: [place] office: [phone]

## 2019-06-09 NOTE — Patient Instructions (Addendum)
.   Get bloodwork and urine test.  o We are ordering labs, so please allow 1-2 weeks for the results to come back. o With the newly implemented Cures Act, the labs might be visible to you at the same time that they become visible to me. However, I will not address the results until all of the results are back, so please be patient.  o In the meantime, continue recommendations in your patient instructions, including avoidance measures (if applicable), until you hear from me.  If you have another reaction, take pictures and let us know. And get a tryptase level within 2-3 hours of reaction.  Continue to avoid drugs that bother you.  For mild symptoms you can take over the counter antihistamines such as Benadryl and monitor symptoms closely. If symptoms worsen or if you have severe symptoms including breathing issues, throat closure, significant swelling, whole body hives, severe diarrhea and vomiting, lightheadedness then seek immediate medical care.  Follow up in 4 weeks or sooner if needed.  Bring in all the pills that you take at the next visit.  Please call your physicians about which medications you need to be on.    Skin care recommendations  Bath time: . Always use lukewarm water. AVOID very hot or cold water. Marland Kitchen Keep bathing time to 5-10 minutes. . Do NOT use bubble bath. . Use a mild soap and use just enough to wash the dirty areas. . Do NOT scrub skin vigorously.  . After bathing, pat dry your skin with a towel. Do NOT rub or scrub the skin.  Moisturizers and prescriptions:  . ALWAYS apply moisturizers immediately after bathing (within 3 minutes). This helps to lock-in moisture. . Use the moisturizer several times a day over the whole body. Kermit Balo summer moisturizers include: Aveeno, CeraVe, Cetaphil. Kermit Balo winter moisturizers include: Aquaphor, Vaseline, Cerave, Cetaphil, Eucerin, Vanicream. . When using moisturizers along with medications, the moisturizer should be applied  about one hour after applying the medication to prevent diluting effect of the medication or moisturize around where you applied the medications. When not using medications, the moisturizer can be continued twice daily as maintenance.  Laundry and clothing: . Avoid laundry products with added color or perfumes. . Use unscented hypo-allergenic laundry products such as Tide free, Cheer free & gentle, and All free and clear.  . If the skin still seems dry or sensitive, you can try double-rinsing the clothes. . Avoid tight or scratchy clothing such as wool. . Do not use fabric softeners or dyer sheets.

## 2019-06-14 LAB — CBC WITH DIFFERENTIAL/PLATELET
Basophils Absolute: 0.1 10*3/uL (ref 0.0–0.2)
Basos: 1 %
EOS (ABSOLUTE): 0.1 10*3/uL (ref 0.0–0.4)
Eos: 1 %
Hematocrit: 39 % (ref 34.0–46.6)
Hemoglobin: 12.8 g/dL (ref 11.1–15.9)
Immature Grans (Abs): 0.1 10*3/uL (ref 0.0–0.1)
Immature Granulocytes: 1 %
Lymphocytes Absolute: 2.3 10*3/uL (ref 0.7–3.1)
Lymphs: 30 %
MCH: 28.7 pg (ref 26.6–33.0)
MCHC: 32.8 g/dL (ref 31.5–35.7)
MCV: 87 fL (ref 79–97)
Monocytes Absolute: 0.7 10*3/uL (ref 0.1–0.9)
Monocytes: 10 %
Neutrophils Absolute: 4.3 10*3/uL (ref 1.4–7.0)
Neutrophils: 57 %
Platelets: 353 10*3/uL (ref 150–450)
RBC: 4.46 x10E6/uL (ref 3.77–5.28)
RDW: 16.2 % — ABNORMAL HIGH (ref 11.7–15.4)
WBC: 7.5 10*3/uL (ref 3.4–10.8)

## 2019-06-15 ENCOUNTER — Telehealth: Payer: Self-pay | Admitting: Internal Medicine

## 2019-06-15 LAB — C074-IGE GELATIN: C074-IgE Gelatin: <0.1 kU/L

## 2019-06-15 MED ORDER — PANTOPRAZOLE SODIUM 40 MG PO TBEC: 40.0000 mg | DELAYED_RELEASE_TABLET | Freq: Two times a day (BID) | ORAL | 3 refills | Status: DC

## 2019-06-15 NOTE — Telephone Encounter (Signed)
Pantoprazole equivalent dose

## 2019-06-15 NOTE — Telephone Encounter (Signed)
Pt aware, script sent to pharmacy. 

## 2019-06-15 NOTE — Telephone Encounter (Signed)
Pt reported that she had an allergic reaction to Nexium and would like to discuss.

## 2019-06-15 NOTE — Telephone Encounter (Signed)
Pt reports she had some redness all over and broke out in hives. Pt states she saw her allergist and was told she is allergic to nexium. States she has been in the ER several times for allergic reactions to nexium, prednisone, was finally given an antihistamine that helped. Pt reports she is now having issues with feeling bloated and having epigastric pain. Pt states she is now taking gas-x and taking baking soda and water. Pt wants to know what [name] recommends since she cannot take nexium any more. Please advise.

## 2019-06-17 LAB — COMPREHENSIVE METABOLIC PANEL
ALT: 14 IU/L (ref 0–32)
AST: 20 IU/L (ref 0–40)
Albumin/Globulin Ratio: 1.6 (ref 1.2–2.2)
Albumin: 3.7 g/dL (ref 3.7–4.7)
Alkaline Phosphatase: 89 IU/L (ref 39–117)
BUN/Creatinine Ratio: 8 — ABNORMAL LOW (ref 12–28)
BUN: 9 mg/dL (ref 8–27)
Bilirubin Total: 0.3 mg/dL (ref 0.0–1.2)
CO2: 25 mmol/L (ref 20–29)
Calcium: 8.9 mg/dL (ref 8.7–10.3)
Chloride: 104 mmol/L (ref 96–106)
Creatinine, Ser: 1.1 mg/dL — ABNORMAL HIGH (ref 0.57–1.00)
GFR calc Af Amer: 58 mL/min/{1.73_m2} — ABNORMAL LOW (ref >59)
GFR calc non Af Amer: 50 mL/min/{1.73_m2} — ABNORMAL LOW (ref >59)
Globulin, Total: 2.3 g/dL (ref 1.5–4.5)
Glucose: 116 mg/dL — ABNORMAL HIGH (ref 65–99)
Potassium: 3.9 mmol/L (ref 3.5–5.2)
Sodium: 143 mmol/L (ref 134–144)
Total Protein: 6 g/dL (ref 6.0–8.5)

## 2019-06-17 LAB — IGE FOOD PROF W/COMPONENT RFLX
Allergen Corn, IgE: <0.1 kU/L
Clam IgE: <0.1 kU/L
Codfish IgE: <0.1 kU/L
F001-IgE Egg White: <0.1 kU/L
F002-IgE Milk: <0.1 kU/L
Peanut, IgE: <0.1 kU/L
Scallop IgE: <0.1 kU/L
Sesame Seed IgE: <0.1 kU/L
Shrimp IgE: <0.1 kU/L
Soybean IgE: <0.1 kU/L
Walnut IgE: <0.1 kU/L
Wheat IgE: <0.1 kU/L

## 2019-06-17 LAB — ALPHA-GAL PANEL
Alpha Gal IgE*: <0.1 kU/L (ref <0.10)
Beef (Bos spp) IgE: <0.1 kU/L (ref <0.35)
Class Interpretation: 0
Class Interpretation: 0
Class Interpretation: 0
Lamb/Mutton (Ovis spp) IgE: <0.1 kU/L (ref <0.35)
Pork (Sus spp) IgE: <0.1 kU/L (ref <0.35)

## 2019-06-17 LAB — ANA W/REFLEX: Anti Nuclear Antibody (ANA): NEGATIVE

## 2019-06-17 LAB — ALLERGENS W/TOTAL IGE AREA 2

## 2019-06-17 LAB — ALLERGEN, ANNATTO SEED
Annatto Seed IgE*: <0.35 kU/L (ref <0.35)
Class Interpretation: 0

## 2019-06-17 LAB — CHRONIC URTICARIA: cu index: <1 (ref <10)

## 2019-06-17 LAB — THYROID CASCADE PROFILE: TSH: 2.26 u[IU]/mL (ref 0.450–4.500)

## 2019-06-17 LAB — ALLERGEN, RED (CARMINE) DYE, RF340: F340-IgE Carmine Red Dye: <0.1 kU/L

## 2019-06-17 LAB — TRYPTASE: Tryptase: 7.2 ug/L (ref 2.2–13.2)

## 2019-06-20 ENCOUNTER — Telehealth: Payer: Medicare Other | Admitting: Physician Assistant

## 2019-06-22 ENCOUNTER — Telehealth: Payer: Self-pay | Admitting: Allergy

## 2019-06-22 NOTE — Telephone Encounter (Signed)
Patient is returning a call about her blood test results.

## 2019-06-22 NOTE — Telephone Encounter (Signed)
i'm waiting on her urine results. When did she drop off her urine?  And can we follow up with the lab where the urine results are?

## 2019-06-22 NOTE — Telephone Encounter (Signed)
Urine labs were sent to [facility] and takes 3-9 business days from the date it was sent out. Currently waiting for results.

## 2019-06-22 NOTE — Telephone Encounter (Signed)
Donna Bailey, can youplease ask Ernst Bowler to follow up with [facility] on this? Thank you.

## 2019-06-22 NOTE — Telephone Encounter (Signed)
Dr. Maudie Mercury can you advise on this. I see that Daisy Lazar and Lonn Georgia were the last ones reviewing this?

## 2019-06-24 NOTE — Telephone Encounter (Signed)
Please call patient.  I do not have the urine results back yet.  I reviewed bloodwork. Blood count, liver function, electrolytes, thyroid, autoimmune screener, inflammation markers and alpha gal (checks for red meat allergy), food panel, environmental allergy panel were all normal which is great. Kidney function called Creatinine level was slightly elevated. Nothing to do for this now. Looks like it was like this before.   Tryptase level which checks for mast cell disorders was normal.

## 2019-06-24 NOTE — Telephone Encounter (Signed)
Mail has already been picked up today. Labs will go out on Monday by mail.

## 2019-06-24 NOTE — Telephone Encounter (Signed)
Left message for patient to call back  

## 2019-06-24 NOTE — Telephone Encounter (Signed)
Candice can you send a copy of the lab results

## 2019-06-24 NOTE — Telephone Encounter (Signed)
Patient returned call to office.  Lab results given to patient per Dr. Maudie Mercury.  Patient informed that we were still waiting on urine results.  Patient voiced understanding and would like a copy of the labs mailed to her.

## 2019-06-24 NOTE — Telephone Encounter (Signed)
Thank you :)

## 2019-07-04 LAB — OTHER LAB TEST

## 2019-07-05 LAB — OTHER LAB TEST

## 2019-07-07 ENCOUNTER — Ambulatory Visit (INDEPENDENT_AMBULATORY_CARE_PROVIDER_SITE_OTHER): Payer: Medicare Other | Admitting: Allergy

## 2019-07-07 ENCOUNTER — Encounter: Payer: Self-pay | Admitting: Allergy

## 2019-07-07 ENCOUNTER — Other Ambulatory Visit: Payer: Self-pay

## 2019-07-07 VITALS — BP 136/88 | HR 80 | Temp 97.6°F | Resp 16 | Ht 66.0 in

## 2019-07-07 DIAGNOSIS — T781XXD Other adverse food reactions, not elsewhere classified, subsequent encounter: Secondary | ICD-10-CM

## 2019-07-07 DIAGNOSIS — J310 Chronic rhinitis: Secondary | ICD-10-CM

## 2019-07-07 DIAGNOSIS — T7840XD Allergy, unspecified, subsequent encounter: Secondary | ICD-10-CM | POA: Diagnosis not present

## 2019-07-07 DIAGNOSIS — Z889 Allergy status to unspecified drugs, medicaments and biological substances status: Secondary | ICD-10-CM

## 2019-07-07 DIAGNOSIS — L853 Xerosis cutis: Secondary | ICD-10-CM | POA: Insufficient documentation

## 2019-07-07 NOTE — Assessment & Plan Note (Signed)
Complaining of dry skin without pruritus or erythema.  Discussed proper skin care.

## 2019-07-07 NOTE — Assessment & Plan Note (Signed)
   See assessment and plan as above.  Continue to avoid drugs that are bothersome.

## 2019-07-07 NOTE — Assessment & Plan Note (Signed)
Past history - Currently avoiding foods containing corn. She was seen by an integrative provider who apparently did some type of food testing and was told she should avoid the following: barley yeast, yogurt, whey and swiss cheese. She eats limited dairy and limited soy products.  Interim history - food panel including corn, red dye was negative on bloodwork.  Continue to avoid foods that bother her.   She most likely has non-IgE mediated intolerances to the above foods.

## 2019-07-07 NOTE — Progress Notes (Signed)
Follow Up Note  RE: Donna Bailey MRN: [id number] DOB: May 15, 1946 Date of Office Visit: 07/07/2019  Referring provider: [name], MD Primary care provider: [name], MD  Chief Complaint: Urticaria  History of Present Illness: I had the pleasure of seeing Donna Bailey for a follow up visit at the [facility] on 07/07/2019. She is a 73 y.o. female, who is being followed for allergic reactions, multiple drug allergies, chronic rhinitis, adverse food reaction. Today she is here for regular follow up visit. Her previous allergy office visit was on 06/09/2019 with Dr. Maudie Mercury.   Allergic reaction Patient states that her skin is bumpy and changed since she had the reaction a few months ago.  Denies any associated pruritus or pain.  Patient used some type of cream prescribed by dermatologist which made the redness go away. She saw the dermatologist 2 weeks ago and recommended to use the cream as needed.   Not moisturizing daily.  Currently on Ambien, Coreg, Protonix daily. Taking lasix as needed.  Hydroxyzine as needed.  The other medications she is not taking anymore. She does have a cardiology visit coming up to address her heart medications.    Chronic rhinitis Asymptomatic.   Adverse food reaction Currently avoiding foods containing corn, barley yeast, yogurt, whey and swiss cheese. She eats limited dairy and limited soy products.   Assessment and Plan: Donna Bailey is a 73 y.o. female with: Allergic reaction Past history - Patient with a complicated medical history (obesity, asthma, allergic rhinitis, GERD, DJD, fibromyalgia, insomnia, hyperlipidemia, ischemic CMO, CAD) who has history of frequent "allergic reactions" in the form of rash and pruritis. These episodes seem to be triggered mainly by medications especially "corn fillers" and unknown chemicals. She is apparently very sensitive to metals and polyester. Most recently she broke  out in rash on her abdominal area which is improving since she stopped taking Nexium and applying topical triamcinolone. She had flu vaccine on 05/26/2019 and ER visit on 05/17/2019 for allergic reaction. Denies any changes in diet, personal care products or recent infections. Since the 05/17/2019 episode, she stopped all her prescribed medications and only taking some OTC [facility] brand antacid. She was followed closely by Dr. [name] (pulmonology) for many years for her allergic reactions.  Interim history - No additional reactions but now complaining of bumpy/dry skin.  Reviewed bloodwork - Blood count, liver function, electrolytes, thyroid, autoimmune screener, inflammation markers and alpha gal, food panel, environmental allergy panel, tryptase were all normal. Kidney function called Creatinine level was slightly elevated. 24 hour urine N-methylhistamine normal at 56mcg/g (ref 30-200) and 2,3 Dinor-11Beta-Prostaglandin F2 alpha normal at 1157pg/mg (ref <5205).   If you have another reaction, take pictures and let us know and get a tryptase level within 2-3 hours of reaction - this will let us know if the reaction is IgE mediated or not.   Continue to avoid drugs that bother you. ? For mild symptoms you can take over the counter antihistamines such as Benadryl and monitor symptoms closely. If symptoms worsen or if you have severe symptoms including breathing issues, throat closure, significant swelling, whole body hives, severe diarrhea and vomiting, lightheadedness then seek immediate medical care.  Follow up with PCP and cardiology regarding her medications she is supposed to be on.  Chronic rhinitis Past history - Patient was on allergy immunotherapy in the past with no benefit. Interim history - Negative environmental allergy panel and asymptomatic.   Monitor symptoms.  Adverse food reaction Past history - Currently avoiding foods containing corn. She was seen by an integrative provider who  apparently did some type of food testing and was told she should avoid the following: barley yeast, yogurt, whey and swiss cheese. She eats limited dairy and limited soy products.  Interim history - food panel including corn, red dye was negative on bloodwork.  Continue to avoid foods that bother her.   She most likely has non-IgE mediated intolerances to the above foods.   Multiple drug allergies  See assessment and plan as above.  Continue to avoid drugs that are bothersome.   Dry skin Complaining of dry skin without pruritus or erythema.  Discussed proper skin care.   Return in about 6 months (around 01/05/2020).  Diagnostics: None.   Medication List:  Current Outpatient Medications  Medication Sig Dispense Refill  . carvedilol (COREG) 6.25 MG tablet Take 1 tablet (6.25 mg total) by mouth 2 (two) times daily with a meal. 180 tablet 2  . Cyanocobalamin (VITAMIN B-12 IJ) Inject 1,000 mg as directed every 30 (thirty) days.    . diphenhydrAMINE (BENADRYL) 25 mg capsule Take 25 mg by mouth every 6 (six) hours as needed for itching or allergies.     . furosemide (LASIX) 20 MG tablet Take 20 mg by mouth daily as needed for fluid.     Marland Kitchen guaiFENesin-codeine 100-10 MG/5ML syrup TAKE 1 TEASPOONFUL BY MOUTH 3 TIMES A DAY AS NEEDED FOR COUGH 120 mL 0  . hydrOXYzine (VISTARIL) 25 MG capsule Take 50 mg by mouth every 8 (eight) hours as needed.    Marland Kitchen MONUROL 3 g PACK Take 3 g by mouth See admin instructions. Mix 1 packet (3 grams) into 3-4 ounces of water drink once a day as needed for bladder or UTI infections- use as directed    . ondansetron (ZOFRAN) 4 MG tablet Take 1 tablet (4 mg total) by mouth every 8 (eight) hours as needed for nausea or vomiting. 4 tablet 0  . pantoprazole (PROTONIX) 40 MG tablet Take 1 tablet (40 mg total) by mouth 2 (two) times daily. 60 tablet 3  . triamcinolone ointment (KENALOG) 0.1 % APPLY TO AFFECTED AREA TWICE A DAY *TAPER USE AS ABLE*    . zolpidem (AMBIEN) 10  MG tablet Take 10 mg by mouth at bedtime as needed for sleep.    Marland Kitchen aspirin 81 MG chewable tablet Chew 1 tablet (81 mg total) by mouth daily. (Patient not taking: Reported on 07/07/2019) 30 tablet 11  . atorvastatin (LIPITOR) 10 MG tablet Take 1 tablet (10 mg total) by mouth daily. (Patient not taking: Reported on 07/07/2019) 90 tablet 1  . felodipine (PLENDIL) 10 MG 24 hr tablet Take 1 tablet (10 mg total) by mouth daily. (Patient not taking: Reported on 07/07/2019) 30 tablet 3  . gabapentin (NEURONTIN) 300 MG capsule     . lansoprazole (PREVACID) 30 MG capsule lansoprazole 30 mg capsule,delayed release    . nitroGLYCERIN (NITROSTAT) 0.4 MG SL tablet Place 1 tablet (0.4 mg total) under the tongue every 5 (five) minutes as needed for chest pain. 25 tablet 2  . ticagrelor (BRILINTA) 60 MG TABS tablet Take 1 tablet (60 mg total) by mouth 2 (two) times daily. (Patient not taking: Reported on 07/07/2019) 180 tablet 3   No current facility-administered medications for this visit.    Allergies: Allergies  Allergen Reactions  . Other Anaphylaxis, Itching, Rash and Other (See Comments)    Corn fillers, corn by-products -  causes severe itching Polyester-"pins sticking in her skin   . Sulfa Antibiotics Anaphylaxis  . Sulfonamide Derivatives Anaphylaxis  . Ciprofloxacin Rash and Other (See Comments)  . Nitrofurantoin Diarrhea, Nausea And Vomiting and Other (See Comments)    Also, "convulsions/constant shaking"- per patient  . Penicillins Rash    Underarms (both) Has patient had a PCN reaction causing immediate rash, facial/tongue/throat swelling, SOB or lightheadedness with hypotension: YES Has patient had a PCN reaction causing severe rash involving mucus membranes or skin necrosis: NO Has patient had a PCN reaction that required hospitalization NO Has patient had a PCN reaction occurring within the last 10 years: NO If all of the above answers are "NO", then may proceed with Cephalosporin use. Other  reaction(s): Other (See Comments) Underarms (both) Other Reaction: "red hot skin" Underarms (both) Has patient had a PCN reaction causing immediate rash, facial/tongue/throat swelling, SOB or lightheadedness with hypotension: YES Has patient had a PCN reaction causing severe rash involving mucus membranes or skin necrosis: NO Has patient had a PCN reaction that required hospitalization NO Has patient had a PCN reaction occurring within the last 10 years: NO If all of the above answers are "NO", then may proceed with Cephalosporin use.  . Prednisone Itching    Pt states she cannot take prednisone with corn filler 05/19/2019.  . Cephalexin Other (See Comments)    Reaction not recalled by the patient  . Cephalosporins Itching    Other reaction(s): Other (See Comments) unknown  . Crestor [Rosuvastatin] Other (See Comments)    Lost all muscle mobility   . Doxycycline Diarrhea and Nausea And Vomiting    Other reaction(s): Other (See Comments)  . Formoterol Other (See Comments)    Reaction not recalled  . Formoterol Fumarate Other (See Comments)    Reaction not recalled  . Gold Sodium Thiosulfate Other (See Comments)    Positive patch test  . Gold-Containing Drug Products Other (See Comments)    Skin tingles  . Levofloxacin In D5w Other (See Comments)    Other Reaction: racing heart Other reaction(s): Other (See Comments) Other Reaction: racing heart  . Macrodantin [Nitrofurantoin Macrocrystal] Itching  . Moxifloxacin Other (See Comments)    Caused hands to "shake"  . Neomycin Other (See Comments)    Doesn't remember - allergist said not to use it because it could add more allergies- Positive patch test   . Ofloxacin Itching  . Ranexa [Ranolazine Er]     itching  . Valsartan Other (See Comments)    Pt reports this med causes her to feel sluggish and she feels heaviness on her shoulders.  . Acetaminophen Rash and Other (See Comments)    Facial rash  . Fexofenadine Palpitations and  Other (See Comments)    Heart races  . Ibuprofen Rash  . Latex Rash and Other (See Comments)    Skin gets red   . Levofloxacin Palpitations and Other (See Comments)    HEART RACING  . Mold Extract [Trichophyton Mentagrophyte] Other (See Comments)    Bumps on back, stops up sinuses.  . Molds & Smuts Rash and Other (See Comments)    Bumps on back, stops up sinuses.  . Nickel Rash  . Tape Rash  . Trichophyton Rash and Other (See Comments)    Bumps on back, stops up sinuses   I reviewed her past medical history, social history, family history, and environmental history and no significant changes have been reported from her previous visit.  Review of Systems  Constitutional: Negative for appetite change, chills, fever and unexpected weight change.  HENT: Negative for congestion and rhinorrhea.   Eyes: Positive for itching.  Respiratory: Negative for cough, chest tightness, shortness of breath and wheezing.   Cardiovascular: Negative for chest pain.  Gastrointestinal: Negative for abdominal pain.  Genitourinary: Negative for difficulty urinating.  Skin: Negative for rash.  Neurological: Negative for headaches.   Objective: BP 136/88   Pulse 80   Temp 97.6 F (36.4 C) (Temporal)   Resp 16   Ht 5\' 6"  (1.676 m)   SpO2 97%   BMI 30.89 kg/m  Body mass index is 30.89 kg/m. Physical Exam  Constitutional: She is oriented to person, place, and time. She appears well-developed and well-nourished.  HENT:  Head: Normocephalic and atraumatic.  Right Ear: External ear normal.  Left Ear: External ear normal.  Nose: Nose normal.  Mouth/Throat: Oropharynx is clear and moist.  Eyes: Conjunctivae and EOM are normal.  Neck: Neck supple.  Cardiovascular: Normal rate, regular rhythm and normal heart sounds. Exam reveals no gallop and no friction rub.  No murmur heard. Pulmonary/Chest: Effort normal and breath sounds normal. She has no wheezes. She has no rales.  Abdominal: Soft.   Neurological: She is alert and oriented to person, place, and time.  Skin: Skin is warm and dry. No rash noted.  Dry skin throughout  Psychiatric: She has a normal mood and affect. Her behavior is normal.  Nursing note and vitals reviewed.  Previous notes and tests were reviewed. The plan was reviewed with the patient/family, and all questions/concerned were addressed.  It was my pleasure to see Devie today and participate in her care. Please feel free to contact me with any questions or concerns.  Sincerely,  Rexene Alberts, DO Allergy & Immunology  [facility] office: [phone] [facility] office: [place] office: [phone]

## 2019-07-07 NOTE — Assessment & Plan Note (Signed)
Past history - Patient was on allergy immunotherapy in the past with no benefit. Interim history - Negative environmental allergy panel and asymptomatic.   Monitor symptoms.

## 2019-07-07 NOTE — Patient Instructions (Addendum)
Allergic reaction  If you have another reaction, take pictures and let us know and get a tryptase level within 2-3 hours of reaction - this will let us know if the reaction is IgE mediated or not.   Continue to avoid drugs that bother you. ? For mild symptoms you can take over the counter antihistamines such as Benadryl and monitor symptoms closely. If symptoms worsen or if you have severe symptoms including breathing issues, throat closure, significant swelling, whole body hives, severe diarrhea and vomiting, lightheadedness then seek immediate medical care.  Multiple drug allergies  Continue to avoid drugs that bother you.   Adverse food reaction  Continue to avoid foods that bother you.   Follow up with Dr. Harrington Challenger and your cardiologist as scheduled.  Follow up in 6 months or sooner if needed.   Skin care recommendations  Bath time: . Always use lukewarm water. AVOID very hot or cold water. Marland Kitchen Keep bathing time to 5-10 minutes. . Do NOT use bubble bath. . Use a mild soap and use just enough to wash the dirty areas. . Do NOT scrub skin vigorously.  . After bathing, pat dry your skin with a towel. Do NOT rub or scrub the skin.  Moisturizers and prescriptions:  . ALWAYS apply moisturizers immediately after bathing (within 3 minutes). This helps to lock-in moisture. . Use the moisturizer several times a day over the whole body. Kermit Balo summer moisturizers include: Aveeno, CeraVe, Cetaphil. Kermit Balo winter moisturizers include: Aquaphor, Vaseline, Cerave, Cetaphil, Eucerin, Vanicream, vaniply ointment. . When using moisturizers along with medications, the moisturizer should be applied about one hour after applying the medication to prevent diluting effect of the medication or moisturize around where you applied the medications. When not using medications, the moisturizer can be continued twice daily as maintenance.  Laundry and clothing: . Avoid laundry products with added color or  perfumes. . Use unscented hypo-allergenic laundry products such as Tide free, Cheer free & gentle, and All free and clear.  . If the skin still seems dry or sensitive, you can try double-rinsing the clothes. . Avoid tight or scratchy clothing such as wool. . Do not use fabric softeners or dyer sheets.

## 2019-07-07 NOTE — Assessment & Plan Note (Signed)
Past history - Patient with a complicated medical history (obesity, asthma, allergic rhinitis, GERD, DJD, fibromyalgia, insomnia, hyperlipidemia, ischemic CMO, CAD) who has history of frequent "allergic reactions" in the form of rash and pruritis. These episodes seem to be triggered mainly by medications especially "corn fillers" and unknown chemicals. She is apparently very sensitive to metals and polyester. Most recently she broke out in rash on her abdominal area which is improving since she stopped taking Nexium and applying topical triamcinolone. She had flu vaccine on 05/26/2019 and ER visit on 05/17/2019 for allergic reaction. Denies any changes in diet, personal care products or recent infections. Since the 05/17/2019 episode, she stopped all her prescribed medications and only taking some OTC [facility] brand antacid. She was followed closely by Dr. [name] (pulmonology) for many years for her allergic reactions.  Interim history - No additional reactions but now complaining of bumpy/dry skin.  Reviewed bloodwork - Blood count, liver function, electrolytes, thyroid, autoimmune screener, inflammation markers and alpha gal, food panel, environmental allergy panel, tryptase were all normal. Kidney function called Creatinine level was slightly elevated. 24 hour urine N-methylhistamine normal at 66mcg/g (ref 30-200) and 2,3 Dinor-11Beta-Prostaglandin F2 alpha normal at 1157pg/mg (ref <5205).   If you have another reaction, take pictures and let us know and get a tryptase level within 2-3 hours of reaction - this will let us know if the reaction is IgE mediated or not.   Continue to avoid drugs that bother you. ? For mild symptoms you can take over the counter antihistamines such as Benadryl and monitor symptoms closely. If symptoms worsen or if you have severe symptoms including breathing issues, throat closure, significant swelling, whole body hives, severe diarrhea and vomiting, lightheadedness then seek  immediate medical care.  Follow up with PCP and cardiology regarding her medications she is supposed to be on.
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
## 2019-07-12 ENCOUNTER — Encounter: Payer: Self-pay | Admitting: Physician Assistant

## 2019-07-12 ENCOUNTER — Other Ambulatory Visit: Payer: Self-pay

## 2019-07-12 ENCOUNTER — Telehealth: Payer: Self-pay

## 2019-07-12 ENCOUNTER — Telehealth (INDEPENDENT_AMBULATORY_CARE_PROVIDER_SITE_OTHER): Payer: Medicare Other | Admitting: Physician Assistant

## 2019-07-12 VITALS — BP 118/75 | HR 71 | Ht 66.0 in | Wt 180.0 lb

## 2019-07-12 DIAGNOSIS — Z789 Other specified health status: Secondary | ICD-10-CM

## 2019-07-12 DIAGNOSIS — I10 Essential (primary) hypertension: Secondary | ICD-10-CM

## 2019-07-12 DIAGNOSIS — I2510 Atherosclerotic heart disease of native coronary artery without angina pectoris: Secondary | ICD-10-CM | POA: Diagnosis not present

## 2019-07-12 DIAGNOSIS — E785 Hyperlipidemia, unspecified: Secondary | ICD-10-CM

## 2019-07-12 DIAGNOSIS — I255 Ischemic cardiomyopathy: Secondary | ICD-10-CM

## 2019-07-12 MED ORDER — ASPIRIN EC 81 MG PO TBEC: 81.0000 mg | DELAYED_RELEASE_TABLET | Freq: Every day | ORAL | 3 refills | Status: DC

## 2019-07-12 NOTE — Patient Instructions (Addendum)
Medication Instructions:  Your physician has recommended you make the following change in your medication:  Please restart aspirin 81mg . We would prefer you to take them every day if you are able, but if you cannot do so, please try at least every other day  *If you need a refill on your cardiac medications before your next appointment, please call your pharmacy*  Lab Work: None ordered  If you have labs (blood work) drawn today and your tests are completely normal, you will receive your results only by: Marland Kitchen MyChart Message (if you have MyChart) OR . A paper copy in the mail If you have any lab test that is abnormal or we need to change your treatment, we will call you to review the results.  Testing/Procedures: None ordered  Follow-Up: At [facility], you and your health needs are our priority.  As part of our continuing mission to provide you with exceptional heart care, we have created designated Provider Care Teams.  These Care Teams include your primary Cardiologist (physician) and Advanced Practice Providers (APPs -  Physician Assistants and Nurse Practitioners) who all work together to provide you with the care you need, when you need it.  Your next appointment:   6 month(s)  The format for your next appointment:   In Person  Provider:   Ena Dawley, MD  Other Instructions

## 2019-07-12 NOTE — Telephone Encounter (Signed)

## 2019-07-13 ENCOUNTER — Telehealth: Payer: Medicare Other | Admitting: Physician Assistant

## 2019-08-02 DIAGNOSIS — K047 Periapical abscess without sinus: Secondary | ICD-10-CM | POA: Insufficient documentation

## 2019-08-02 DIAGNOSIS — J0141 Acute recurrent pansinusitis: Secondary | ICD-10-CM | POA: Insufficient documentation

## 2019-08-14 IMAGING — CR DG FOREARM 2V*R*
2 series · 2 of 2 positions shown · non-contrast
Comparison: None.

CLINICAL DATA: Swelling.  Stent placement last week.  Rule out gas.

EXAM:
RIGHT FOREARM - 2 VIEW

[forearm ap]
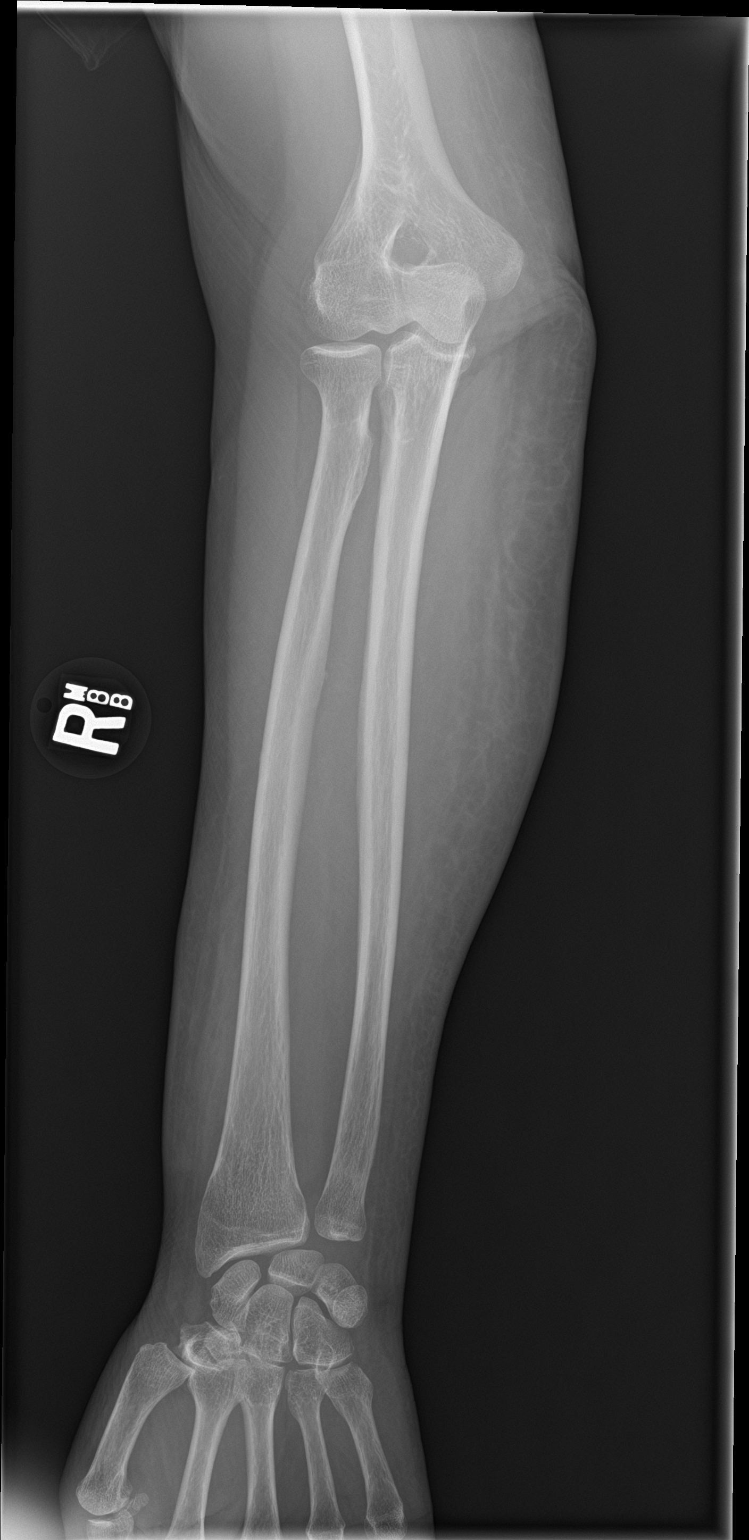

[forearm lat]
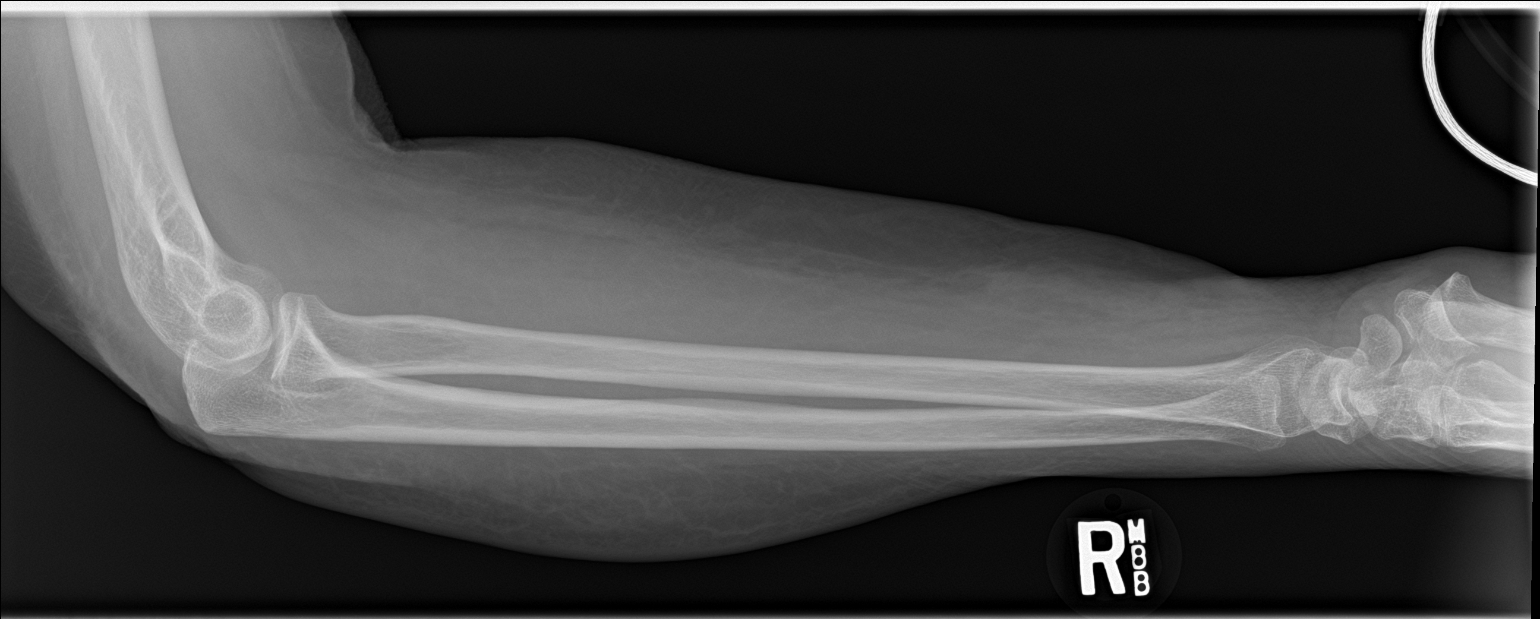

[2 of 2 positions shown; findings below may reference images not displayed]

FINDINGS: Moderate soft tissue swelling about the forearm, primarily
posteriorly and medially. No soft tissue gas. No radiopaque foreign
object. No osseous destruction. No elbow joint effusion.
IMPRESSION: Soft tissue swelling, likely representing cellulitis.

## 2019-08-16 ENCOUNTER — Encounter: Payer: Self-pay | Admitting: Allergy

## 2019-10-18 ENCOUNTER — Other Ambulatory Visit: Payer: Self-pay | Admitting: Cardiology

## 2019-10-19 NOTE — Telephone Encounter (Signed)
Pt's pharmacy is requesting a refill on atorvastatin. This medication was D/C off of pt's medication list. I do not see where the doctor D/C this medication. Please clarify if pt is supposed to still be taking this medication. Thanks

## 2019-11-14 ENCOUNTER — Telehealth: Payer: Self-pay | Admitting: Physician Assistant

## 2019-11-14 NOTE — Telephone Encounter (Signed)
Just an FYI, I will keep an eye for notes.

## 2019-11-14 NOTE — Telephone Encounter (Signed)
Patient states her PCP Dr. Harrington Challenger from [facility] told her to make an appointment, because of her lab work. She states his office is faxing the results. Her appointment is 11/18/2019.

## 2019-11-17 ENCOUNTER — Ambulatory Visit: Payer: Medicare Other | Admitting: Internal Medicine

## 2019-11-17 NOTE — Telephone Encounter (Signed)
Labs received from patients PCP

## 2019-11-17 NOTE — Telephone Encounter (Signed)
Have you seen any labs come in yet? Richardson Dopp, PA-C    11/17/2019 9:26 AM
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
## 2019-11-18 ENCOUNTER — Telehealth (INDEPENDENT_AMBULATORY_CARE_PROVIDER_SITE_OTHER): Payer: Medicare Other | Admitting: Physician Assistant

## 2019-11-18 ENCOUNTER — Encounter: Payer: Self-pay | Admitting: Physician Assistant

## 2019-11-18 ENCOUNTER — Other Ambulatory Visit: Payer: Self-pay

## 2019-11-18 VITALS — BP 119/80 | HR 61 | Ht 66.0 in | Wt 187.0 lb

## 2019-11-18 DIAGNOSIS — I25119 Atherosclerotic heart disease of native coronary artery with unspecified angina pectoris: Secondary | ICD-10-CM

## 2019-11-18 DIAGNOSIS — E785 Hyperlipidemia, unspecified: Secondary | ICD-10-CM | POA: Diagnosis not present

## 2019-11-18 DIAGNOSIS — I10 Essential (primary) hypertension: Secondary | ICD-10-CM

## 2019-11-18 NOTE — Patient Instructions (Addendum)
Medication Instructions:   Your physician has recommended you make the following change in your medication:   1) Restart Aspirin 81 mg, 1 tablet by mouth once a day  *If you need a refill on your cardiac medications before your next appointment, please call your pharmacy*  Lab Work:  Your physician recommends that you return for lab work in 6 weeks on   If you have labs (blood work) drawn today and your tests are completely normal, you will receive your results only by: Marland Kitchen MyChart Message (if you have MyChart) OR . A paper copy in the mail If you have any lab test that is abnormal or we need to change your treatment, we will call you to review the results.  Testing/Procedures:  Your physician has requested that you have a lexiscan myoview. For further information please visit [email or web address]. Please follow instruction sheet, as given.  Follow-Up: At [facility], you and your health needs are our priority.  As part of our continuing mission to provide you with exceptional heart care, we have created designated Provider Care Teams.  These Care Teams include your primary Cardiologist (physician) and Advanced Practice Providers (APPs -  Physician Assistants and Nurse Practitioners) who all work together to provide you with the care you need, when you need it.  We recommend signing up for the patient portal called "MyChart".  Sign up information is provided on this After Visit Summary.  MyChart is used to connect with patients for Virtual Visits (Telemedicine).  Patients are able to view lab/test results, encounter notes, upcoming appointments, etc.  Non-urgent messages can be sent to your provider as well.   To learn more about what you can do with MyChart, go to [email or web address].    Your next appointment:    With lipid clinic in 8 weeks and Dr. Meda Coffee in 3-4 weeks

## 2019-11-21 ENCOUNTER — Telehealth (HOSPITAL_COMMUNITY): Payer: Self-pay | Admitting: *Deleted

## 2019-11-21 NOTE — Telephone Encounter (Signed)
Left message on voicemail per DPR in reference to upcoming appointment scheduled on 11/23/19 at 7:15 with detailed instructions given per Myocardial Perfusion Study Information Sheet for the test. LM to arrive 15 minutes early, and that it is imperative to arrive on time for appointment to keep from having the test rescheduled. If you need to cancel or reschedule your appointment, please call the office within 24 hours of your appointment. Failure to do so may result in a cancellation of your appointment, and a $50 no show fee. Phone number given for call back for any questions.

## 2019-11-22 ENCOUNTER — Telehealth: Payer: Self-pay

## 2019-11-22 ENCOUNTER — Telehealth: Payer: Self-pay | Admitting: Cardiology

## 2019-11-22 DIAGNOSIS — E785 Hyperlipidemia, unspecified: Secondary | ICD-10-CM

## 2019-11-22 DIAGNOSIS — Z789 Other specified health status: Secondary | ICD-10-CM

## 2019-11-22 DIAGNOSIS — I25119 Atherosclerotic heart disease of native coronary artery with unspecified angina pectoris: Secondary | ICD-10-CM

## 2019-11-22 NOTE — Telephone Encounter (Signed)
Referral to Dr. Debara Pickett started.

## 2019-11-22 NOTE — Telephone Encounter (Signed)
   Went to pt's chart to check who's calling her, transferred to [facility] to schedule lipid clinic

## 2019-11-22 NOTE — Telephone Encounter (Signed)
-----   Message from Liliane Shi, [place] sent at 11/21/2019  3:45 PM EDT ----- Thanks Jinny Blossom.  Raquel Sarna - Can you make sure she is referred to Dr. Debara Pickett for [facility]? Thanks, Event organiser ----- Message ----- From: Leeroy Bock, RPH-CPP Sent: 11/19/2019   8:46 AM EDT To: Liliane Shi, PA-C  I am totally fine with sending her to Dr Debara Pickett - she is a tough patient with her corn allergy and intolerances to multiple medications. He may have some additional insight.  Thanks!Megan ----- Message ----- From: Sharmon Revere Sent: 11/18/2019  10:57 AM EDT To: Leeroy Bock, RPH  Megan You have seen this patient before. Her LDL is 198. She was off Atorva at the time labs were drawn. She seems to be able to tolerate it; maybe.  I spoke to her about Bempedoic Acid and she is interested in trying it. I have her getting a f/u lipid panel in 6 weeks and then f/u with you afterward. If you think having her see Dr. Debara Pickett is better, let me know and we can change it. Scott

## 2019-11-23 ENCOUNTER — Encounter: Payer: Self-pay | Admitting: Physician Assistant

## 2019-11-23 ENCOUNTER — Ambulatory Visit (HOSPITAL_COMMUNITY): Payer: Medicare Other | Attending: Cardiovascular Disease

## 2019-11-23 ENCOUNTER — Other Ambulatory Visit: Payer: Self-pay

## 2019-11-23 DIAGNOSIS — I25119 Atherosclerotic heart disease of native coronary artery with unspecified angina pectoris: Secondary | ICD-10-CM | POA: Diagnosis not present

## 2019-11-23 LAB — MYOCARDIAL PERFUSION IMAGING
LV dias vol: 55 mL (ref 46–106)
LV sys vol: 21 mL
Peak HR: 90 {beats}/min
Rest HR: 59 {beats}/min
SDS: 2
SRS: 0
SSS: 2
TID: 1.17

## 2019-11-23 MED ORDER — TECHNETIUM TC 99M TETROFOSMIN IV KIT
31.7000 | PACK | Freq: Once | INTRAVENOUS | Status: AC | PRN
  Administered 2019-11-23: 31.7 via INTRAVENOUS
  Filled 2019-11-23: qty 32

## 2019-11-23 MED ORDER — TECHNETIUM TC 99M TETROFOSMIN IV KIT
10.1000 | PACK | Freq: Once | INTRAVENOUS | Status: AC | PRN
  Administered 2019-11-23: 10.1 via INTRAVENOUS
  Filled 2019-11-23: qty 11

## 2019-11-23 MED ORDER — REGADENOSON 0.4 MG/5ML IV SOLN
0.4000 mg | Freq: Once | INTRAVENOUS | Status: AC
  Administered 2019-11-23: 0.4 mg via INTRAVENOUS

## 2019-11-23 MED ORDER — AMINOPHYLLINE 25 MG/ML IV SOLN
150.0000 mg | Freq: Once | INTRAVENOUS | Status: AC
  Administered 2019-11-23: 150 mg via INTRAVENOUS

## 2019-12-01 ENCOUNTER — Telehealth: Payer: Self-pay | Admitting: Internal Medicine

## 2019-12-01 ENCOUNTER — Telehealth (INDEPENDENT_AMBULATORY_CARE_PROVIDER_SITE_OTHER): Payer: Medicare Other | Admitting: Internal Medicine

## 2019-12-01 ENCOUNTER — Encounter: Payer: Self-pay | Admitting: Internal Medicine

## 2019-12-01 VITALS — BP 137/82 | HR 63 | Temp 97.2°F | Wt 186.0 lb

## 2019-12-01 DIAGNOSIS — Z789 Other specified health status: Secondary | ICD-10-CM | POA: Diagnosis not present

## 2019-12-01 DIAGNOSIS — T466X5A Adverse effect of antihyperlipidemic and antiarteriosclerotic drugs, initial encounter: Secondary | ICD-10-CM

## 2019-12-01 DIAGNOSIS — E785 Hyperlipidemia, unspecified: Secondary | ICD-10-CM | POA: Diagnosis not present

## 2019-12-01 DIAGNOSIS — M791 Myalgia, unspecified site: Secondary | ICD-10-CM | POA: Diagnosis not present

## 2019-12-01 DIAGNOSIS — I2510 Atherosclerotic heart disease of native coronary artery without angina pectoris: Secondary | ICD-10-CM

## 2019-12-01 NOTE — Telephone Encounter (Signed)
Medication samples have been provided to the patient.  Drug name: nexletol 180mg   Qty: 14 tablets  LOT: [id number]  Exp.Date: 12/2020  Samples left at front desk for patient pick-up. Patient notified.  Fidel Levy 9:33 AM 12/01/2019   Patient aware that MD suggested she try Nexletol 180mg  daily. She will pick up samples today. Asked that she call our office to update on how she is tolerating medication and if she does OK, will submit for PA approval w/insurance, have her complete labs in 3-4 months and follow up after. She voiced understanding.

## 2019-12-01 NOTE — Patient Instructions (Signed)
Medication Instructions:  Dr. Debara Pickett has suggested that you try NEXLETOL 180mg  daily If you tolerate the medication, we will work on getting it approved with your insurance company Please come to Dr. Lysbeth Penner office @ [address] to pick up samples  *If you need a refill on your cardiac medications before your next appointment, please call your pharmacy*   Lab Work: NONE needed at this time If you start Nexletol therapy, we will have to you check cholesterol in 3-4 months  If you have labs (blood work) drawn today and your tests are completely normal, you will receive your results only by: Marland Kitchen MyChart Message (if you have MyChart) OR . A paper copy in the mail If you have any lab test that is abnormal or we need to change your treatment, we will call you to review the results.   Testing/Procedures: NONE   Follow-Up: At [facility], you and your health needs are our priority.  As part of our continuing mission to provide you with exceptional heart care, we have created designated Provider Care Teams.  These Care Teams include your primary Cardiologist (physician) and Advanced Practice Providers (APPs -  Physician Assistants and Nurse Practitioners) who all work together to provide you with the care you need, when you need it.  We recommend signing up for the patient portal called "MyChart".  Sign up information is provided on this After Visit Summary.  MyChart is used to connect with patients for Virtual Visits (Telemedicine).  Patients are able to view lab/test results, encounter notes, upcoming appointments, etc.  Non-urgent messages can be sent to your provider as well.   To learn more about what you can do with MyChart, go to [email or web address].    Your next appointment:   3-4 month(s) - lipid clinic  The format for your next appointment:   Either In Person or Virtual  Provider:   K. Mali Hilty, MD
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
## 2019-12-23 ENCOUNTER — Other Ambulatory Visit: Payer: Medicare Other | Admitting: *Deleted

## 2019-12-23 ENCOUNTER — Other Ambulatory Visit: Payer: Self-pay

## 2019-12-23 ENCOUNTER — Other Ambulatory Visit: Payer: Medicare Other

## 2019-12-23 DIAGNOSIS — E785 Hyperlipidemia, unspecified: Secondary | ICD-10-CM

## 2019-12-23 DIAGNOSIS — I10 Essential (primary) hypertension: Secondary | ICD-10-CM

## 2019-12-23 DIAGNOSIS — I25119 Atherosclerotic heart disease of native coronary artery with unspecified angina pectoris: Secondary | ICD-10-CM

## 2019-12-23 LAB — LIPID PANEL
Chol/HDL Ratio: 4.1 ratio (ref 0.0–4.4)
Cholesterol, Total: 210 mg/dL — ABNORMAL HIGH (ref 100–199)
HDL: 51 mg/dL (ref >39)
LDL Chol Calc (NIH): 144 mg/dL — ABNORMAL HIGH (ref 0–99)
Triglycerides: 83 mg/dL (ref 0–149)
VLDL Cholesterol Cal: 15 mg/dL (ref 5–40)

## 2019-12-23 LAB — HEPATIC FUNCTION PANEL
ALT: 11 IU/L (ref 0–32)
AST: 15 IU/L (ref 0–40)
Albumin: 4.1 g/dL (ref 3.7–4.7)
Alkaline Phosphatase: 80 IU/L (ref 48–121)
Bilirubin Total: <0.2 mg/dL (ref 0.0–1.2)
Bilirubin, Direct: 0.07 mg/dL (ref 0.00–0.40)
Total Protein: 6.7 g/dL (ref 6.0–8.5)

## 2019-12-27 ENCOUNTER — Other Ambulatory Visit: Payer: Medicare Other

## 2020-01-05 ENCOUNTER — Ambulatory Visit: Payer: Medicare Other | Admitting: Allergy

## 2020-01-08 ENCOUNTER — Other Ambulatory Visit: Payer: Self-pay | Admitting: Internal Medicine

## 2020-01-09 NOTE — Progress Notes (Deleted)
Follow Up Note  RE: Donna Bailey MRN: [id number] DOB: 16-Feb-1946 Date of Office Visit: 01/10/2020  Referring provider: [name], MD Primary care provider: [name], MD  Chief Complaint: No chief complaint on file.  History of Present Illness: I had the pleasure of seeing Donna Bailey for a follow up visit at the [facility] on 01/09/2020. She is a 74 y.o. female, who is being followed for allergic reaction, chronic rhinitis, adverse food reaction, multiple drug allergies. Her previous allergy office visit was on 07/07/2019 with Dr. Maudie Mercury. Today is a regular follow up visit.  Allergic reaction Past history - Patient with a complicated medical history (obesity, asthma, allergic rhinitis, GERD, DJD, fibromyalgia, insomnia, hyperlipidemia, ischemic CMO, CAD) who has history of frequent "allergic reactions" in the form of rash and pruritis. These episodes seem to be triggered mainly by medications especially "corn fillers" and unknown chemicals. She is apparently very sensitive to metals and polyester. Most recently she broke out in rash on her abdominal area which is improving since she stopped taking Nexium and applying topical triamcinolone. She had flu vaccine on 05/26/2019 and ER visit on 05/17/2019 for allergic reaction. Denies any changes in diet, personal care products or recent infections. Since the 05/17/2019 episode, she stopped all her prescribed medications and only taking some OTC [facility] brand antacid. She was followed closely by Dr. [name] (pulmonology) for many years for her allergic reactions.  Interim history - No additional reactions but now complaining of bumpy/dry skin.  Reviewed bloodwork - Blood count, liver function, electrolytes, thyroid, autoimmune screener, inflammation markers and alpha gal, food panel, environmental allergy panel, tryptase were all normal. Kidney function called Creatinine level was slightly elevated. 24 hour urine  N-methylhistamine normal at 92mcg/g (ref 30-200) and 2,3 Dinor-11Beta-Prostaglandin F2 alpha normal at 1157pg/mg (ref <5205).   If you have another reaction, take pictures and let us knowandget a tryptase level within 2-3 hours of reaction - this will let us know if the reaction is IgE mediated or not.  Continue to avoid drugs that bother you. ? For mild symptoms you can take over the counter antihistamines such as Benadryl and monitor symptoms closely. If symptoms worsen or if you have severe symptoms including breathing issues, throat closure, significant swelling, whole body hives, severe diarrhea and vomiting, lightheadedness then seek immediate medical care.  Follow up with PCP and cardiology regarding her medications she is supposed to be on.  Chronic rhinitis Past history - Patient was on allergy immunotherapy in the past with no benefit. Interim history - Negative environmental allergy panel and asymptomatic.   Monitor symptoms.   Adverse food reaction Past history - Currently avoiding foods containing corn. She was seen by an integrative provider who apparently did some type of food testing and was told she should avoid the following: barley yeast, yogurt, whey and swiss cheese. She eats limited dairy and limited soy products.  Interim history - food panel including corn, red dye was negative on bloodwork.  Continue to avoid foods that bother her.   She most likely has non-IgE mediated intolerances to the above foods.   Multiple drug allergies  See assessment and plan as above.  Continue to avoid drugs that are bothersome.   Dry skin Complaining of dry skin without pruritus or erythema.  Discussed proper skin care.   Return in about 6 months (around 01/05/2020).  Assessment and Plan: Donna Bailey is a 74 y.o. female with: No problem-specific Assessment &  Plan notes found for this encounter.  No follow-ups on file.  No orders of the defined types were placed in  this encounter.  Lab Orders  No laboratory test(s) ordered today    Diagnostics: Spirometry:  Tracings reviewed. Her effort: {Blank single:19197::"Good reproducible efforts.","It was hard to get consistent efforts and there is a question as to whether this reflects a maximal maneuver.","Poor effort, data can not be interpreted."} FVC: ***L FEV1: ***L, ***% predicted FEV1/FVC ratio: ***% Interpretation: {Blank single:19197::"Spirometry consistent with mild obstructive disease","Spirometry consistent with moderate obstructive disease","Spirometry consistent with severe obstructive disease","Spirometry consistent with possible restrictive disease","Spirometry consistent with mixed obstructive and restrictive disease","Spirometry uninterpretable due to technique","Spirometry consistent with normal pattern","No overt abnormalities noted given today's efforts"}.  Please see scanned spirometry results for details.  Skin Testing: {Blank single:19197::"Select foods","Environmental allergy panel","Environmental allergy panel and select foods","Food allergy panel","None","Deferred due to recent antihistamines use"}. Positive test to: ***. Negative test to: ***.  Results discussed with patient/family.   Medication List:  Current Outpatient Medications  Medication Sig Dispense Refill  . aspirin EC 81 MG tablet Take 81 mg by mouth daily.    Marland Kitchen atorvastatin (LIPITOR) 10 MG tablet TAKE 1 TABLET BY MOUTH EVERY DAY 90 tablet 1  . carvedilol (COREG) 6.25 MG tablet TAKE 1 TABLET TWICE A DAY WITH A MEAL 180 tablet 2  . Cyanocobalamin (VITAMIN B-12 IJ) Inject 1,000 mg as directed every 30 (thirty) days.    . diphenhydrAMINE (BENADRYL) 25 mg capsule Take 25 mg by mouth every 6 (six) hours as needed for itching or allergies.     . furosemide (LASIX) 20 MG tablet Take 20 mg by mouth daily as needed for fluid.     . hydrOXYzine (VISTARIL) 25 MG capsule Take 50 mg by mouth every 8 (eight) hours as needed.    .  nitroGLYCERIN (NITROSTAT) 0.4 MG SL tablet Place 1 tablet (0.4 mg total) under the tongue every 5 (five) minutes as needed for chest pain. 25 tablet 2  . ondansetron (ZOFRAN) 4 MG tablet Take 1 tablet (4 mg total) by mouth every 8 (eight) hours as needed for nausea or vomiting. 4 tablet 0  . pantoprazole (PROTONIX) 40 MG tablet Take 1 tablet (40 mg total) by mouth 2 (two) times daily. Office visit for further refills 60 tablet 1   No current facility-administered medications for this visit.   Allergies: Allergies  Allergen Reactions  . Other Anaphylaxis, Itching, Rash and Other (See Comments)    Corn fillers, corn by-products - causes severe itching Polyester-"pins sticking in her skin   . Sulfa Antibiotics Anaphylaxis  . Sulfonamide Derivatives Anaphylaxis  . Ciprofloxacin Rash and Other (See Comments)  . Nitrofurantoin Diarrhea, Nausea And Vomiting and Other (See Comments)    Also, "convulsions/constant shaking"- per patient  . Penicillins Rash    Underarms (both) Has patient had a PCN reaction causing immediate rash, facial/tongue/throat swelling, SOB or lightheadedness with hypotension: YES Has patient had a PCN reaction causing severe rash involving mucus membranes or skin necrosis: NO Has patient had a PCN reaction that required hospitalization NO Has patient had a PCN reaction occurring within the last 10 years: NO If all of the above answers are "NO", then may proceed with Cephalosporin use. Other reaction(s): Other (See Comments) Underarms (both) Other Reaction: "red hot skin" Underarms (both) Has patient had a PCN reaction causing immediate rash, facial/tongue/throat swelling, SOB or lightheadedness with hypotension: YES Has patient had a PCN reaction causing severe rash involving mucus membranes or  skin necrosis: NO Has patient had a PCN reaction that required hospitalization NO Has patient had a PCN reaction occurring within the last 10 years: NO If all of the above answers  are "NO", then may proceed with Cephalosporin use.  . Prednisone Itching    Pt states she cannot take prednisone with corn filler 05/19/2019.  Marland Kitchen Brilinta [Ticagrelor]     headache  . Cephalexin Other (See Comments)    Reaction not recalled by the patient  . Cephalosporins Itching    Other reaction(s): Other (See Comments) unknown  . Crestor [Rosuvastatin] Other (See Comments)    Lost all muscle mobility   . Doxycycline Diarrhea and Nausea And Vomiting    Other reaction(s): Other (See Comments)  . Formoterol Other (See Comments)    Reaction not recalled  . Formoterol Fumarate Other (See Comments)    Reaction not recalled  . Gold Sodium Thiosulfate Other (See Comments)    Positive patch test  . Gold-Containing Drug Products Other (See Comments)    Skin tingles  . Levofloxacin In D5w Other (See Comments)    Other Reaction: racing heart Other reaction(s): Other (See Comments) Other Reaction: racing heart  . Macrodantin [Nitrofurantoin Macrocrystal] Itching  . Moxifloxacin Other (See Comments)    Caused hands to "shake"  . Neomycin Other (See Comments)    Doesn't remember - allergist said not to use it because it could add more allergies- Positive patch test   . Ofloxacin Itching  . Praluent [Alirocumab]     shaking  . Ranexa [Ranolazine Er]     itching  . Repatha [Evolocumab]     shaking  . Valsartan Other (See Comments)    Pt reports this med causes her to feel sluggish and she feels heaviness on her shoulders.  Earnestine Mealing [Colesevelam]   . Zetia [Ezetimibe]   . Acetaminophen Rash and Other (See Comments)    Facial rash  . Fexofenadine Palpitations and Other (See Comments)    Heart races  . Ibuprofen Rash  . Latex Rash and Other (See Comments)    Skin gets red   . Levofloxacin Palpitations and Other (See Comments)    HEART RACING  . Mold Extract [Trichophyton Mentagrophyte] Other (See Comments)    Bumps on back, stops up sinuses.  . Molds & Smuts Rash and Other (See  Comments)    Bumps on back, stops up sinuses.  . Nickel Rash  . Tape Rash  . Trichophyton Rash and Other (See Comments)    Bumps on back, stops up sinuses   I reviewed her past medical history, social history, family history, and environmental history and no significant changes have been reported from her previous visit.  Review of Systems  Constitutional: Negative for appetite change, chills, fever and unexpected weight change.  HENT: Negative for congestion and rhinorrhea.   Eyes: Positive for itching.  Respiratory: Negative for cough, chest tightness, shortness of breath and wheezing.   Cardiovascular: Negative for chest pain.  Gastrointestinal: Negative for abdominal pain.  Genitourinary: Negative for difficulty urinating.  Skin: Negative for rash.  Neurological: Negative for headaches.   Objective: There were no vitals taken for this visit. There is no height or weight on file to calculate BMI. Physical Exam  Constitutional: She is oriented to person, place, and time. She appears well-developed and well-nourished.  HENT:  Head: Normocephalic and atraumatic.  Right Ear: External ear normal.  Left Ear: External ear normal.  Nose: Nose normal.  Mouth/Throat: Oropharynx is clear  and moist.  Eyes: Conjunctivae and EOM are normal.  Cardiovascular: Normal rate, regular rhythm and normal heart sounds. Exam reveals no gallop and no friction rub.  No murmur heard. Pulmonary/Chest: Effort normal and breath sounds normal. She has no wheezes. She has no rales.  Abdominal: Soft.  Musculoskeletal:     Cervical back: Neck supple.  Neurological: She is alert and oriented to person, place, and time.  Skin: Skin is warm and dry. No rash noted.  Dry skin throughout  Psychiatric: She has a normal mood and affect. Her behavior is normal.  Nursing note and vitals reviewed.  Previous notes and tests were reviewed. The plan was reviewed with the patient/family, and all questions/concerned  were addressed.  It was my pleasure to see Donna Bailey today and participate in her care. Please feel free to contact me with any questions or concerns.  Sincerely,  Rexene Alberts, DO Allergy & Immunology  [facility] office: [phone] [facility] office: [place] office: [phone]

## 2020-01-10 ENCOUNTER — Ambulatory Visit: Payer: Medicare Other | Admitting: Allergy

## 2020-01-11 ENCOUNTER — Ambulatory Visit: Payer: Medicare Other | Admitting: Cardiology

## 2020-01-12 NOTE — Telephone Encounter (Signed)
Pt is scheduled to see Richardson Dopp PA-C on 7/6 at 1115, made by Scott's CMA.

## 2020-02-06 NOTE — Progress Notes (Signed)
Cardiology Office Note:    Date:  02/07/2020   ID:  Donna Bailey, DOB 02/21/1946, MRN [id number]  PCP:  [name], MD  Cardiologist:  Ena Dawley, MD   Electrophysiologist:  None   Referring MD: [name], MD   Chief Complaint:  Follow-up (CAD)    Patient Profile:    Donna Bailey is a 74 y.o. female with:   Coronary artery disease ? S/p Anterior STEMI 4/19 >> s/p DES to LAD  C/b radial artery dissection and hematoma  ? Cath 6/19: LAD stent patent, D1 branches w mod to severe dz (not amenable to PCI), mod RCA dz, oLCx 60 (neg FFR) >> Med Rx ? Myoview 09/2018: low risk ? Cath 11/2018: LAD stent patent, no change since 6/19 ? NSTEMI 04/2019 in setting of profound hypotension due to n/v/d, diuretic use, SL NTG use >> Cath w/ patent LAD stent, mod oLCx, mDx branch, RCA stenosis (unchanged) >> Med Rx   Ischemic CM ? EF returned to normal [40-45 (4/19) >> 65 (5/19)]  Chronic kidney disease stage 2  GERD  Barrett's Esophagus  Hyperlipidemia  ? Intol of statins, PCSK9 inhibitors  Asthma   Multiple Med Intolerances/Allergies  ? Unable to take Plavix, Amlodipine due to allergy to corn filler ? DC'd Praluent >> shaking ? DC'd Ranolazine >> Itching ? DC'd Valsartan >> multiple symptoms ? Seen by allergist; Food panel w/ corn, red dye negative   Prior CV studies: Myoview 11/23/2019 EF 61, normal perfusion, low risk   Cardiac catheterization 04/26/2019 LM normal  LAD prox stent patent w 25 ISR; D1 60; Lat D1 50 LCx 40 RCA mid 40 EF 55-65  Event Monitor 01/2019 Normal sinus rhythm, sinus tachycardia No arrhythmias, pauses  Cardiac Catheterization4/24/2020 LM normal LAD prox stent patent; D1 60; Lat D1 50 LCx ost 60 RCA mid 40 EF > 65  Myoview 09/09/2018 Normal stress nuclear study with no ischemia or infarction; EF 69 with normal wall motion.  Cardiac catheterization 01/22/2018 Widely patent stent in mid LAD and stable moderate  to severe disease involving branches of D1. Moderate, non-obstructive mid RCA disease. 60% ostial LCx stenosis with minimal luminal area of 5.1 cm^2 by IVUS and FFR 0.85.  Echo 12/04/2017 No new wall motion abnormalities, EF 65, no pericardial effusion, normal aortic root  Echo 11/14/17 EF 40-45, apical ant-sept, inf, inf-sept, apical HK c/w ischemia in the distribution of the LAD  Cardiac Catheterization4/12/19 LM normal LAD prox 100, D1 80, Lat D1 50 LCx ost 90 RCA mid 25 PCI: 2.75 x 23 mm Sierra DES to LAD  History of Present Illness:    Donna Bailey was last seen via Telemedicine in 11/2019.  She was [facility] to help with her cholesterol management in the setting of multiple drug intolerances. She also had symptoms of bilateral arm heaviness.  A Myoview was obtained and this was low risk with normal perfusion.  She returns for follow up.  She is here alone. She notes that her husband has passed out again. He has issues with orthostasis. She has been doing well without chest discomfort, significant shortness of breath or syncope. She has not had significant leg swelling. She did not try Nexletol due to prior history of tendinitis.  Past Medical History:  Diagnosis Date  . Allergic rhinitis, cause unspecified   . Anemia   . Aneurysm of right conjunctiva    right eye   . Anxiety   . Asthma   .  Barrett's esophagus   . CAD in native artery    a. 11/2017: STEMI s/p DES to prox-mid LAD; LCx stenosis managed medically. Case complicated by R forearm hematoma. b. Several subsequent caths, last in 04/2019 with stable disease // Myoview 11/2019: EF 61, normal perfusion; low risk   . CKD (chronic kidney disease), stage II   . Constipation   . Cystocele   . Deviated nasal septum   . Diaphragmatic hernia without mention of obstruction or gangrene   . Esophageal reflux   . Fibromyalgia   . H/O hiatal hernia   . Hypertension   . Insomnia, unspecified   . Ischemic  cardiomyopathy    a. EF 40-45% by echo 11/2017. // Echo 5/19: No new wall motion abnormalities, EF 65, no pericardial effusion, normal aortic root  . Kidney stones    hx of pt see Dr. Risa Grill  . Medication intolerance    numerous  . Migraine   . Myalgia and myositis, unspecified   . Nuclear stress tests    Cardiolite 2/18: no ischemia or scar, EF 78; Low Risk  . Osteoarthrosis, unspecified whether generalized or localized, unspecified site   . Peripheral neuropathy   . Pneumonia    hx of  . Pure hypercholesterolemia   . Rectocele   . Scoliosis (and kyphoscoliosis), idiopathic   . Statin intolerance   . Temporomandibular joint disorders, unspecified   . Urticaria   . Wheat allergy     Current Medications: Current Meds  Medication Sig  . atorvastatin (LIPITOR) 10 MG tablet TAKE 1 TABLET BY MOUTH EVERY DAY  . carvedilol (COREG) 6.25 MG tablet TAKE 1 TABLET TWICE A DAY WITH A MEAL  . Cyanocobalamin (VITAMIN B-12 IJ) Inject 1,000 mg as directed every 30 (thirty) days.  . diphenhydrAMINE (BENADRYL) 25 mg capsule Take 25 mg by mouth every 6 (six) hours as needed for itching or allergies.   Marland Kitchen esomeprazole (NEXIUM) 40 MG capsule Take 40 mg by mouth daily at 12 noon.  . furosemide (LASIX) 20 MG tablet Take 20 mg by mouth daily as needed for fluid.   . Gabapentin 10 % CREA Apply 1-2 application topically at bedtime.  . hydrOXYzine (VISTARIL) 25 MG capsule Take 50 mg by mouth every 8 (eight) hours as needed for anxiety.   . lidocaine (LIDODERM) 5 % SMARTSIG:0-3 Patch(s) Topical As Directed  . nitroGLYCERIN (NITROSTAT) 0.4 MG SL tablet Place 1 tablet (0.4 mg total) under the tongue every 5 (five) minutes as needed for chest pain.  Marland Kitchen ondansetron (ZOFRAN) 4 MG tablet Take 1 tablet (4 mg total) by mouth every 8 (eight) hours as needed for nausea or vomiting.     Allergies:   Cephalosporins, Crestor [rosuvastatin], Doxycycline, Formoterol, Other, Sulfa antibiotics, Sulfonamide derivatives,  Ciprofloxacin, Nitrofurantoin, Penicillins, Prednisone, Brilinta [ticagrelor], Cephalexin, Formoterol fumarate, Gold sodium thiosulfate, Gold-containing drug products, Levofloxacin in d5w, Macrodantin [nitrofurantoin macrocrystal], Moxifloxacin, Neomycin, Ofloxacin, Praluent [alirocumab], Ranexa [ranolazine er], Repatha [evolocumab], Valsartan, Welchol [colesevelam], Zetia [ezetimibe], Acetaminophen, Fexofenadine, Ibuprofen, Latex, Levofloxacin, Mold extract [trichophyton mentagrophyte], Molds & smuts, Nickel, Tape, and Trichophyton   Social History   Tobacco Use  . Smoking status: Never Smoker  . Smokeless tobacco: Never Used  Vaping Use  . Vaping Use: Never used  Substance Use Topics  . Alcohol use: No    Alcohol/week: 0.0 standard drinks  . Drug use: No     Family Hx: The patient's family history includes Breast cancer in her sister; Cancer in her sister; Cancer (age of onset:  64) in her sister; Colitis in her mother; Colon polyps in her brother; Diverticulosis in her mother; Heart disease in her father and mother; Leukemia in her sister; Tuberculosis in her brother. There is no history of Colon cancer, Esophageal cancer, Rectal cancer, or Stomach cancer.  ROS   EKGs/Labs/Other Test Reviewed:    EKG:  EKG is   ordered today.  The ekg ordered today demonstrates normal sinus rhythm, heart rate 82, normal axis, QTC 415, no change from prior tracing  Recent Labs: 04/25/2019: Magnesium 1.7 06/13/2019: BUN 9; Creatinine, Ser 1.10; Hemoglobin 12.8; Platelets 353; Potassium 3.9; Sodium 143; TSH 2.260 12/23/2019: ALT 11   Recent Lipid Panel Lab Results  Component Value Date/Time   CHOL 210 (H) 12/23/2019 08:02 AM   TRIG 83 12/23/2019 08:02 AM   HDL 51 12/23/2019 08:02 AM   CHOLHDL 4.1 12/23/2019 08:02 AM   CHOLHDL 4.9 12/02/2017 09:33 PM   LDLCALC 144 (H) 12/23/2019 08:02 AM    Physical Exam:    VS:  BP 122/76   Pulse 82   Ht 5\' 6"  (1.676 m)   Wt 189 lb 3.2 oz (85.8 kg)   SpO2  97%   BMI 30.54 kg/m     Wt Readings from Last 3 Encounters:  02/07/20 189 lb 3.2 oz (85.8 kg)  12/01/19 186 lb (84.4 kg)  11/23/19 187 lb (84.8 kg)     Constitutional:      Appearance: Healthy appearance. Not in distress.  Neck:     Thyroid: No thyromegaly.     Vascular: JVD normal.  Pulmonary:     Effort: Pulmonary effort is normal.     Breath sounds: No wheezing. No rales.  Cardiovascular:     Normal rate. Regular rhythm. Normal S1. Normal S2.     Murmurs: There is no murmur.  Edema:    Peripheral edema absent.  Abdominal:     Palpations: Abdomen is soft. There is no hepatomegaly.  Skin:    General: Skin is warm and dry.  Neurological:     Mental Status: Alert and oriented to person, place and time.     Cranial Nerves: Cranial nerves are intact.       ASSESSMENT & PLAN:    1. Coronary artery disease involving native coronary artery of native heart with angina pectoris [facility]) She has a history of anterior ST elevation myocardial infarction in April 2019 treated with a drug-eluting stent to the LAD.  She has had multiple heart catheterizations since that time.  As noted, she also has multiple drug intolerances.  She underwent cardiac catheterization in June 2019 as well as April 2020.  Her LAD stent was patent and she had moderate disease elsewhere.  She does have LCx stenosis that has been evaluated by FFR.  This was not felt to be hemodynamically significant.  She had elevated enzymes in September 2020 in the setting of hypotension, nausea, vomiting, diarrhea in the setting of diuretic therapy.  Cardiac catheterization again demonstrated patent LAD stent and stable disease elsewhere. Nuclear stress test in April 2021 demonstrated no ischemia. She is doing well without anginal symptoms. Continue aspirin, atorvastatin, carvedilol.  2. Essential hypertension The patient's blood pressure is controlled on her current regimen.  Continue current therapy.   3. Hyperlipidemia LDL  goal <70 She has multiple drug intolerances. She did tolerate PCSK9 inhibitors for short period of time but then started having tremors. I will check with our pharmacist to see if she could possibly take a PCSK9  inhibitor once a month or once every other month. Continue current dose of atorvastatin for now. Refer to nutritionist.   Dispo:  Return in about 6 months (around 08/09/2020) for Routine Follow Up with Dr. Meda Coffee, or Richardson Dopp, PA-C, in person.   Medication Adjustments/Labs and Tests Ordered: Current medicines are reviewed at length with the patient today.  Concerns regarding medicines are outlined above.  Tests Ordered: Orders Placed This Encounter  Procedures  . Ambulatory referral to Nutrition and Diabetic Education  . EKG 12-Lead   Medication Changes: No orders of the defined types were placed in this encounter.   Signed, Richardson Dopp, PA-C  02/07/2020 5:13 PM    [facility] [place], [place], [place]  [address] Phone: [phone]; Fax: [phone]

## 2020-02-07 ENCOUNTER — Encounter: Payer: Self-pay | Admitting: Physician Assistant

## 2020-02-07 ENCOUNTER — Other Ambulatory Visit: Payer: Self-pay

## 2020-02-07 ENCOUNTER — Ambulatory Visit (INDEPENDENT_AMBULATORY_CARE_PROVIDER_SITE_OTHER): Payer: Medicare Other | Admitting: Physician Assistant

## 2020-02-07 VITALS — BP 122/76 | HR 82 | Ht 66.0 in | Wt 189.2 lb

## 2020-02-07 DIAGNOSIS — I10 Essential (primary) hypertension: Secondary | ICD-10-CM

## 2020-02-07 DIAGNOSIS — E785 Hyperlipidemia, unspecified: Secondary | ICD-10-CM

## 2020-02-07 DIAGNOSIS — I25119 Atherosclerotic heart disease of native coronary artery with unspecified angina pectoris: Secondary | ICD-10-CM | POA: Diagnosis not present

## 2020-02-07 NOTE — Patient Instructions (Signed)
Medication Instructions:   Your physician recommends that you continue on your current medications as directed. Please refer to the Current Medication list given to you today.  *If you need a refill on your cardiac medications before your next appointment, please call your pharmacy*  Lab Work:  None ordered today  Testing/Procedures:  None ordered today  Follow-Up: At [facility], you and your health needs are our priority.  As part of our continuing mission to provide you with exceptional heart care, we have created designated Provider Care Teams.  These Care Teams include your primary Cardiologist (physician) and Advanced Practice Providers (APPs -  Physician Assistants and Nurse Practitioners) who all work together to provide you with the care you need, when you need it.  We recommend signing up for the patient portal called "MyChart".  Sign up information is provided on this After Visit Summary.  MyChart is used to connect with patients for Virtual Visits (Telemedicine).  Patients are able to view lab/test results, encounter notes, upcoming appointments, etc.  Non-urgent messages can be sent to your provider as well.   To learn more about what you can do with MyChart, go to [email or web address].    Your next appointment:   6 month(s)  The format for your next appointment:   In Person  Provider:   You may see Ena Dawley, MD or Richardson Dopp, PA-C

## 2020-02-09 ENCOUNTER — Telehealth: Payer: Self-pay | Admitting: Pharmacist

## 2020-02-09 MED ORDER — PRALUENT 75 MG/ML ~~LOC~~ SOAJ: SUBCUTANEOUS | 11 refills | Status: DC

## 2020-02-09 NOTE — Telephone Encounter (Signed)
Spoke with patient. We will start with Praluent 75mg  once a month. Will increase to twice a month if patient feels comfortable doing so.

## 2020-02-09 NOTE — Addendum Note (Signed)
Addended by: Marcelle Overlie D on: 02/09/2020 04:47 PM   Modules accepted: Orders

## 2020-02-09 NOTE — Telephone Encounter (Signed)
Richardson Dopp, PA requested that we get patient back on Praluent. Patient willing to retry. Will try dosing just once a month to see if tolerated better.  Prior Josem Kaufmann is already on file through 08/03/20 Called patient and LVM to discuss

## 2020-02-29 ENCOUNTER — Other Ambulatory Visit (HOSPITAL_COMMUNITY): Payer: Self-pay | Admitting: Family Medicine

## 2020-02-29 ENCOUNTER — Other Ambulatory Visit: Payer: Self-pay | Admitting: Family Medicine

## 2020-02-29 DIAGNOSIS — R1011 Right upper quadrant pain: Secondary | ICD-10-CM

## 2020-03-01 ENCOUNTER — Ambulatory Visit (HOSPITAL_COMMUNITY)
Admission: RE | Admit: 2020-03-01 | Discharge: 2020-03-01 | Disposition: A | Discharge status: Home / Self Care | Payer: Medicare Other | Source: Ambulatory Visit | Attending: Family Medicine | Admitting: Family Medicine

## 2020-03-01 DIAGNOSIS — R1011 Right upper quadrant pain: Secondary | ICD-10-CM | POA: Diagnosis present

## 2020-03-15 ENCOUNTER — Encounter: Payer: Medicare Other | Attending: Physician Assistant | Admitting: Dietician

## 2020-03-15 ENCOUNTER — Other Ambulatory Visit: Payer: Self-pay

## 2020-03-15 ENCOUNTER — Encounter: Payer: Self-pay | Admitting: Dietician

## 2020-03-15 DIAGNOSIS — N1831 Chronic kidney disease, stage 3a: Secondary | ICD-10-CM

## 2020-03-15 DIAGNOSIS — E785 Hyperlipidemia, unspecified: Secondary | ICD-10-CM | POA: Insufficient documentation

## 2020-03-15 NOTE — Patient Instructions (Addendum)
Senior Line:  [phone] call to ask about Meals on Wheels  Your meals should consist of vegetables (particulaly greens) , fruits, simple lean meats. Breakfast:  Oatmeal, fruit OR toast, egg, fruit  Limit milk and choose 1%  See the Mediterranean Diet Handout

## 2020-03-15 NOTE — Progress Notes (Signed)
  Medical Nutrition Therapy:  Appt start time: 2122 end time:  4825.   Assessment:  Primary concerns today: Patient is here today with her husband.  I saw her husband in the past for type 2 diabetes and CKD stage 4. She was referred for CAD and HLD.  She states that she does not tolerate any of the medications well.  History includes: HTN, HLD, CAD, CKD, neuropathy Food allergies include:  Corn, black pepper, lentils  Patient lives with her husband.  They share shopping and buy very easy preprepared foods.  She states that she does not have energy to cook and if she its anything with black pepper it makes her sick (vomitting and diarrhea).  Her husband will not eat leftovers and often eats out.   She is a retired Art therapist.  Preferred Learning Style:   No preference indicated   Learning Readiness:   Contemplating   MEDICATIONS: see list   DIETARY INTAKE:  24-hr recall:  B ( AM): egg, juice or milk, occasional toast OR instant oatmeal, juice or milk  Snk ( AM): none  L ( PM): TV dinner (Banquet or other) or Take out  Snk ( PM): none D ( PM): TV dinner or Take out with or without salad Snk ( PM): none Beverages: juice, 2% milk, water, regular lemonade  Usual physical activity: ADL's  Progress Towards Goal(s):  In progress.   Nutritional Diagnosis:  NB-1.1 Food and nutrition-related knowledge deficit As related to diet and CKD and HLD.  As evidenced by diet hx and patient report.    Intervention:  Nutrition counseling related to CKD and HLD. Discussed foods high in phosphorous and sodium.  Discussed types of fats and impact on cholesterol.  Discussed eating more vegetables (particularly greens), whole grains, and fruit and that any meat should be lean.  Patient does not think that she can eat a plant based meal and be satisfied.  Discussed Higher education careers adviser options such as meals on wheals.  Plan: Senior Line:  [phone] call to ask about Meals on Wheels  Your meals  should consist of vegetables (particulaly greens) , fruits, simple lean meats. Breakfast:  Oatmeal, fruit OR toast, egg, fruit Limit milk and choose 1% See the Mediterranean Diet Handout  Teaching Method Utilized:  Auditory  Handouts given during visit include:  Top low phosphorous foods article from [employer]  NKD National Kidney diet placemat  Types of fat  Mediterranean diet   Barriers to learning/adherence to lifestyle change: physical condition, meal preferences, lack of support, food allergies  Demonstrated degree of understanding via:  Teach Back   Monitoring/Evaluation:  Dietary intake, exercise, label reading, and body weight prn.

## 2020-03-22 ENCOUNTER — Ambulatory Visit (INDEPENDENT_AMBULATORY_CARE_PROVIDER_SITE_OTHER): Payer: Medicare Other | Admitting: Internal Medicine

## 2020-03-22 ENCOUNTER — Encounter: Payer: Self-pay | Admitting: Internal Medicine

## 2020-03-22 ENCOUNTER — Other Ambulatory Visit: Payer: Self-pay

## 2020-03-22 VITALS — BP 120/70 | HR 74 | Ht 66.0 in | Wt 185.6 lb

## 2020-03-22 DIAGNOSIS — M791 Myalgia, unspecified site: Secondary | ICD-10-CM | POA: Diagnosis not present

## 2020-03-22 DIAGNOSIS — I25119 Atherosclerotic heart disease of native coronary artery with unspecified angina pectoris: Secondary | ICD-10-CM | POA: Diagnosis not present

## 2020-03-22 DIAGNOSIS — T466X5A Adverse effect of antihyperlipidemic and antiarteriosclerotic drugs, initial encounter: Secondary | ICD-10-CM | POA: Diagnosis not present

## 2020-03-22 DIAGNOSIS — E785 Hyperlipidemia, unspecified: Secondary | ICD-10-CM
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
## 2020-03-22 NOTE — Patient Instructions (Signed)
Medication Instructions:  TAKE Prlauent once every 14 days Continue all other current medications   *If you need a refill on your cardiac medications before your next appointment, please call your pharmacy*   Lab Work: FASTING lipid panel in 1 month  FASTING lipid panel in 6 months - due about 1 week before your next visit  If you have labs (blood work) drawn today and your tests are completely normal, you will receive your results only by:  [place] (if you have MyChart) OR  A paper copy in the mail If you have any lab test that is abnormal or we need to change your treatment, we will call you to review the results.   Testing/Procedures: NONE   Follow-Up: At [facility], you and your health needs are our priority.  As part of our continuing mission to provide you with exceptional heart care, we have created designated Provider Care Teams.  These Care Teams include your primary Cardiologist (physician) and Advanced Practice Providers (APPs -  Physician Assistants and Nurse Practitioners) who all work together to provide you with the care you need, when you need it.  We recommend signing up for the patient portal called "MyChart".  Sign up information is provided on this After Visit Summary.  MyChart is used to connect with patients for Virtual Visits (Telemedicine).  Patients are able to view lab/test results, encounter notes, upcoming appointments, etc.  Non-urgent messages can be sent to your provider as well.   To learn more about what you can do with MyChart, go to [email or web address].    Your next appointment:   6 month(s)  The format for your next appointment:   In Person or Virtual  Provider:   Raliegh Ip Mali Hilty, MD   Other Instructions  If you need co-pay assistance grant, please look into the program at healthwellfoundation.org >> disease funds >> hypercholesterolemia. This is an online application or you can call to complete. Once approved, you will  provide the "pharmacy card" information to your pharmacy and they will deduct the co-pays from this grant.

## 2020-04-08 ENCOUNTER — Other Ambulatory Visit: Payer: Self-pay | Admitting: Cardiology

## 2020-04-12 ENCOUNTER — Other Ambulatory Visit: Payer: Self-pay | Admitting: Internal Medicine

## 2020-04-12 MED ORDER — ZOLPIDEM TARTRATE 10 MG PO TABS
10.0000 mg | ORAL_TABLET | Freq: Every evening | ORAL | 5 refills | Status: DC | PRN
Start: 2020-04-12 — End: 2021-09-03

## 2020-04-18 ENCOUNTER — Other Ambulatory Visit: Payer: Self-pay

## 2020-04-18 ENCOUNTER — Emergency Department (HOSPITAL_COMMUNITY)
Admission: EM | Admit: 2020-04-18 | Discharge: 2020-04-18 | Disposition: A | Discharge status: Home / Self Care | Payer: Medicare Other | Attending: Emergency Medicine | Admitting: Emergency Medicine

## 2020-04-18 ENCOUNTER — Emergency Department (HOSPITAL_COMMUNITY): Payer: Medicare Other

## 2020-04-18 DIAGNOSIS — S0101XA Laceration without foreign body of scalp, initial encounter: Secondary | ICD-10-CM

## 2020-04-18 DIAGNOSIS — S0990XA Unspecified injury of head, initial encounter: Secondary | ICD-10-CM | POA: Diagnosis present

## 2020-04-18 DIAGNOSIS — W08XXXA Fall from other furniture, initial encounter: Secondary | ICD-10-CM | POA: Insufficient documentation

## 2020-04-18 DIAGNOSIS — M542 Cervicalgia: Secondary | ICD-10-CM | POA: Diagnosis not present

## 2020-04-18 DIAGNOSIS — W19XXXA Unspecified fall, initial encounter: Secondary | ICD-10-CM

## 2020-04-18 DIAGNOSIS — S0181XA Laceration without foreign body of other part of head, initial encounter: Secondary | ICD-10-CM | POA: Insufficient documentation

## 2020-04-18 DIAGNOSIS — Z23 Encounter for immunization: Secondary | ICD-10-CM | POA: Insufficient documentation

## 2020-04-18 DIAGNOSIS — M546 Pain in thoracic spine: Secondary | ICD-10-CM | POA: Diagnosis not present

## 2020-04-18 MED ORDER — FENTANYL CITRATE (PF) 100 MCG/2ML IJ SOLN
50.0000 ug | Freq: Once | INTRAMUSCULAR | Status: AC
  Administered 2020-04-18: 50 ug via INTRAMUSCULAR
  Filled 2020-04-18: qty 2

## 2020-04-18 MED ORDER — TETANUS-DIPHTH-ACELL PERTUSSIS 5-2.5-18.5 LF-MCG/0.5 IM SUSP
0.5000 mL | Freq: Once | INTRAMUSCULAR | Status: AC
  Administered 2020-04-18: 0.5 mL via INTRAMUSCULAR
  Filled 2020-04-18: qty 0.5

## 2020-04-18 MED ORDER — LIDOCAINE HCL 2 % IJ SOLN
10.0000 mL | Freq: Once | INTRAMUSCULAR | Status: AC
  Administered 2020-04-18: 200 mg via INTRADERMAL
  Filled 2020-04-18: qty 20

## 2020-04-18 NOTE — ED Provider Notes (Signed)
[facility] EMERGENCY DEPARTMENT Provider Note   CSN: [id number] Arrival date & time: 04/18/20  1205     History Chief Complaint  Patient presents with  . Fall    Donna Bailey is a 74 y.o. female.  The history is provided by the patient, medical records and the EMS personnel. No language interpreter was used.  Fall This is a new problem. The current episode started less than 1 hour ago. The problem occurs constantly. The problem has not changed since onset.Associated symptoms include headaches. Pertinent negatives include no chest pain, no abdominal pain and no shortness of breath. Nothing aggravates the symptoms. Nothing relieves the symptoms. She has tried nothing for the symptoms. The treatment provided no relief.       No past medical history on file.  There are no problems to display for this patient.    OB History   No obstetric history on file.     No family history on file.  Social History   Tobacco Use  . Smoking status: Not on file  Substance Use Topics  . Alcohol use: Not on file  . Drug use: Not on file    Home Medications Prior to Admission medications   Not on File    Allergies    Patient has no allergy information on record.  Review of Systems   Review of Systems  Constitutional: Negative for chills, diaphoresis, fatigue and fever.  HENT: Negative for congestion.   Eyes: Negative for visual disturbance.  Respiratory: Negative for chest tightness, shortness of breath, wheezing and stridor.   Cardiovascular: Negative for chest pain.  Gastrointestinal: Negative for abdominal pain, constipation, diarrhea, nausea and rectal pain.  Genitourinary: Negative for flank pain.  Musculoskeletal: Positive for neck pain. Negative for gait problem and neck stiffness.  Neurological: Positive for headaches. Negative for dizziness, seizures, syncope, weakness and light-headedness.  Psychiatric/Behavioral: Negative for agitation.    All other systems reviewed and are negative.   Physical Exam Updated Vital Signs BP (!) 155/62 (BP Location: Right Arm)   Pulse 66   Temp (!) 97.4 F (36.3 C)   Resp 16   SpO2 99%   Physical Exam Vitals and nursing note reviewed.  Constitutional:      General: She is not in acute distress.    Appearance: She is well-developed. She is not ill-appearing, toxic-appearing or diaphoretic.  HENT:     Head: Laceration present.      Comments: Extraocular movements intact.  No other facial tenderness other than near the laceration.    Nose: No congestion or rhinorrhea.     Right Nostril: No septal hematoma.     Left Nostril: No septal hematoma.     Mouth/Throat:     Mouth: Mucous membranes are moist.     Pharynx: No oropharyngeal exudate or posterior oropharyngeal erythema.  Eyes:     Extraocular Movements: Extraocular movements intact.     Conjunctiva/sclera: Conjunctivae normal.     Pupils: Pupils are equal, round, and reactive to light.  Cardiovascular:     Rate and Rhythm: Normal rate and regular rhythm.     Pulses: Normal pulses.     Heart sounds: No murmur heard.   Pulmonary:     Effort: Pulmonary effort is normal. No respiratory distress.     Breath sounds: Normal breath sounds. No wheezing, rhonchi or rales.  Chest:     Chest wall: No tenderness.  Abdominal:     General: Abdomen is flat.  Palpations: Abdomen is soft.     Tenderness: There is no abdominal tenderness. There is no guarding or rebound.  Musculoskeletal:        General: Tenderness present.     Cervical back: Neck supple. Tenderness present.  Skin:    General: Skin is warm and dry.     Capillary Refill: Capillary refill takes less than 2 seconds.     Findings: No erythema.  Neurological:     General: No focal deficit present.     Mental Status: She is alert.  Psychiatric:        Mood and Affect: Mood normal.     ED Results / Procedures / Treatments   Labs (all labs ordered are listed, but  only abnormal results are displayed) Labs Reviewed - No data to display  EKG None  Radiology CT Head Wo Contrast  Result Date: 04/18/2020 CLINICAL DATA:  Fall with head trauma. EXAM: CT HEAD WITHOUT CONTRAST TECHNIQUE: Contiguous axial images were obtained from the base of the skull through the vertex without intravenous contrast. COMPARISON:  03/20/2018 FINDINGS: Brain: No acute intracranial finding. Age related atrophy. Chronic physiologic calcification within the left cerebellum and both globus palladi, unchanged since 2019. No evidence of acute infarction, mass lesion, hemorrhage, hydrocephalus or extra-axial collection. Vascular: There is atherosclerotic calcification of the major vessels at the base of the brain. Skull: No skull fracture. Sinuses/Orbits: Clear/normal Other: Right scalp injury. IMPRESSION: Right scalp injury. No underlying skull fracture. No acute intracranial finding. Age related atrophy. Chronic physiologic calcification within the left cerebellum and both globus palladi, unchanged since 2019. Electronically Signed   By: Nelson Chimes M.D.   On: 04/18/2020 13:33   CT Cervical Spine Wo Contrast  Result Date: 04/18/2020 CLINICAL DATA:  Fall.  Trauma to the neck. EXAM: CT CERVICAL SPINE WITHOUT CONTRAST TECHNIQUE: Multidetector CT imaging of the cervical spine was performed without intravenous contrast. Multiplanar CT image reconstructions were also generated. COMPARISON:  08/15/2007 FINDINGS: Alignment: No traumatic malalignment. Skull base and vertebrae: No cervical region fracture or focal bone lesion. Soft tissues and spinal canal: Negative Disc levels: Chronic bridging osteophytes anteriorly at C3-4. Mild spondylosis at C4-5, C5-6 and C6-7 with disc space narrowing and endplate osteophytes. Bony foraminal narrowing on the left at C6-7. Upper chest: Negative Other: None IMPRESSION: No acute or traumatic finding. Chronic degenerative spondylosis as outlined above. Electronically  Signed   By: Nelson Chimes M.D.   On: 04/18/2020 13:35   CT Thoracic Spine Wo Contrast  Result Date: 04/18/2020 CLINICAL DATA:  Fall with thoracic region back pain. EXAM: CT THORACIC SPINE WITHOUT CONTRAST TECHNIQUE: Multidetector CT images of the thoracic were obtained using the standard protocol without intravenous contrast. COMPARISON:  12/03/2017 FINDINGS: Alignment: Normal Vertebrae: No thoracic region acute injury. Old spinous process fracture at T2, visible on the study of 2019. Posteromedial ribs are negative. Paraspinal and other soft tissues: Negative Disc levels: Ordinary thoracic degenerative disc disease and degenerative facet disease, similar to the study of 2019. No evidence of spinal canal or neural foraminal stenosis. IMPRESSION: No acute or traumatic finding in the thoracic region. Old spinous process fracture at T2. Ordinary thoracic degenerative disc disease and degenerative facet disease, similar to the study of 2019. Electronically Signed   By: Nelson Chimes M.D.   On: 04/18/2020 13:32    Procedures Procedures (including critical care time)  Medications Ordered in ED Medications  Tdap (BOOSTRIX) injection 0.5 mL (0.5 mLs Intramuscular Given 04/18/20 1403)  fentaNYL (SUBLIMAZE) injection  50 mcg (50 mcg Intramuscular Given 04/18/20 1408)  lidocaine (XYLOCAINE) 2 % (with pres) injection 200 mg (200 mg Intradermal Given 04/18/20 1500)    ED Course  I have reviewed the triage vital signs and the nursing notes.  Pertinent labs & imaging results that were available during my care of the patient were reviewed by me and considered in my medical decision making (see chart for details).    MDM Rules/Calculators/A&P                          Donna Bailey is a 74 y.o. female with a past medical history significant for CAD status post PCI, migraines, and prior serial aneurysm who presents for a mechanical fall.  According to patient, she had a fall today falling over some furniture.   She was made a level 2 trauma on arrival due to fall on antiplatelet medications of aspirin and Brilinta.  She denied loss of consciousness.  She is on aspirin and Brilinta.  She is complaining of some pain in her head and neck and upper back.  She denies any chest pain, shortness breath, abdominal pain.  She denies any neurologic deficits.  No vision changes nausea, vomiting.  On exam, patient has a CC of laceration on her forehead.  It is hemostatic but is large with a flap.  Lungs clear and chest nontender.  Some mild tenderness on her right neck as well as her right upper back.  Exam otherwise unremarkable.  Patient had CT scans of her head, neck, and thoracic spine.  They did not show any acute injuries.  No skull fracture or internal bleeding.  Laceration repaired by emergency PA team.  It was repaired without difficulty.  Patient will follow up with PCP for suture removal and understood return precautions and follow-up instructions.  She no questions or concerns and was discharged in good condition.   Final Clinical Impression(s) / ED Diagnoses Final diagnoses:  Fall, initial encounter  Laceration of scalp, initial encounter    Rx / DC Orders ED Discharge Orders    None     Clinical Impression: 1. Fall, initial encounter   2. Laceration of scalp, initial encounter     Disposition: Discharge  Condition: Good  I have discussed the results, Dx and Tx plan with the pt(& family if present). He/she/they expressed understanding and agree(s) with the plan. Discharge instructions discussed at great length. Strict return precautions discussed and pt &/or family have verbalized understanding of the instructions. No further questions at time of discharge.    New Prescriptions   No medications on file    Follow Up: [name], MD [place] [address] [place] [address] [id number] mc [place]       Dafina Suk, Gwenyth Allegra, MD 04/18/20 [phone]

## 2020-04-18 NOTE — Discharge Instructions (Signed)
Your imaging today did not show any evidence of bleeding inside the head or skull fracture.  No other acute injury seen.  The laceration was repaired without difficulty and is well-appearing.  Please keep the dressing in place.  Please follow-up with your primary doctor in 10 days to 2 weeks for reevaluation and likely suture removal.  If any symptoms change or worsen, please return emergency department.

## 2020-04-18 NOTE — ED Provider Notes (Signed)
LACERATION REPAIR Performed by: Delia Heady Authorized by: Delia Heady Consent: Verbal consent obtained. Risks and benefits: risks, benefits and alternatives were discussed Consent given by: patient Patient identity confirmed: provided demographic data Prepped and Draped in normal sterile fashion Wound explored  Laceration Location: Forehead  Laceration Length: 4cm  No Foreign Bodies seen or palpated  Anesthesia: local infiltration  Local anesthetic: lidocaine 2% without epinephrine  Anesthetic total: 5 ml  Irrigation method: syringe Amount of cleaning: extensive  Skin closure: 3-0 Ethilon  Number of sutures: 9  Technique: Simple interrupted  Patient tolerance: Patient tolerated the procedure well with no immediate complications.    Delia Heady, PA-C 04/18/20 1629    Tegeler, Gwenyth Allegra, MD 04/18/20 [phone]

## 2020-04-18 NOTE — ED Triage Notes (Signed)
BIB by [facility] after pt reported falling over furniture at home. PT denies LOC. PT is on blood thinners- aspirin and Brilinta. PT c/o neck and head pain and has C-shaped laceration on head. PT refused C-collar due to Polyester allergy. PT has hx of cardiac stint placement and HTN.  Vitals:  98.0 Temp Oral B/P 161/61 99% RA HR 73 RR 13

## 2020-04-18 NOTE — ED Notes (Signed)
Pt requesting pain meds- states unable to take any PO meds- due to allergy to CORN FILLER. Requesting IV fluids because "I have lost so much blood I feel dehydrated"

## 2020-04-18 NOTE — Progress Notes (Signed)
Orthopedic Tech Progress Note Patient Details:  Donna Bailey [birth year] [id number] Level 2 trauma Patient ID: Donna Bailey, female   DOB: July 19, 1946, 74 y.o.   MRN: [id number]   Donna Bailey 04/18/2020, 12:43 PM

## 2020-05-02 ENCOUNTER — Other Ambulatory Visit: Payer: Self-pay

## 2020-05-02 ENCOUNTER — Ambulatory Visit (INDEPENDENT_AMBULATORY_CARE_PROVIDER_SITE_OTHER): Payer: Medicare Other | Admitting: Internal Medicine

## 2020-05-02 ENCOUNTER — Telehealth: Payer: Self-pay | Admitting: Internal Medicine

## 2020-05-02 ENCOUNTER — Encounter: Payer: Self-pay | Admitting: Internal Medicine

## 2020-05-02 VITALS — BP 118/70 | HR 72 | Temp 97.3°F | Ht 66.0 in | Wt 186.2 lb

## 2020-05-02 DIAGNOSIS — J45909 Unspecified asthma, uncomplicated: Secondary | ICD-10-CM

## 2020-05-02 DIAGNOSIS — Z23 Encounter for immunization: Secondary | ICD-10-CM | POA: Diagnosis not present

## 2020-05-02 DIAGNOSIS — I25119 Atherosclerotic heart disease of native coronary artery with unspecified angina pectoris: Secondary | ICD-10-CM | POA: Diagnosis not present

## 2020-05-02 MED ORDER — GUAIFENESIN-CODEINE 100-10 MG/5ML PO SOLN: ORAL | 0 refills | Status: DC

## 2020-05-02 NOTE — Telephone Encounter (Signed)
Pt is requesting a call back from a nurse to discuss if she could possibly have the EGD and Colonoscopy done at the same time.

## 2020-05-02 NOTE — Telephone Encounter (Signed)
Pt is due for colon in November. She is asking if she can go ahead and have EGD done as well, recall for EGD is 10/23. States she has problems with IV's and would like to have them both done. Please advise.

## 2020-05-02 NOTE — Patient Instructions (Signed)
Cough syrup refilled- use sparingly  Ok to continue [employer]- be careful with night time unsteadiness.  Order- flu vax- senior  Please call if we can help

## 2020-05-02 NOTE — Assessment & Plan Note (Signed)
Significant problems last year, but stable by report, pending cardiology f/u

## 2020-05-02 NOTE — Assessment & Plan Note (Signed)
Mild intermittent uncomplicated. Doesn't need inhalers now. Agreed to refill codeine cough syrup with some discussion.

## 2020-05-02 NOTE — Progress Notes (Signed)
HPI female never smoker followed for bronchitis, allergic rhinitis, chronic insomnia, "intolerance of all corn products", complicated by GERD and multiple medical problems, CAD/MI/ stent Seroquel made her too dizzy. Belsomra 15 mg made her muscles stiff and left her groggy but did help her sleep. Rozerem did not help with sleep but made her dizzy She again requests prescription for zolpidem 10 mg, insisting that nothing else has worked as well to help her manage chronic insomnia. She limits foods with magnesium which she says make her feel hot She tells me she is allergic to nickel, gold and polyester "all of which conduct electricity". Therefore she wants a letter for her to present to [place] asking them to change her electric meter to a "noncommunicating electric meter", so that [employer] will stop sending radio waves through her home. She prefers a manual meter check.  ------------------------------------------------------------------------------------------  05/19/2019- Virtual Visit via Telephone Note  History of Present Illness: 52 yoF known to me with hx of allergic rhinitis, chronic bronchitis, CAD/ MI/ stent and intolerance to corn products. Obesity, Hyperlipidemia Hosp admit  9/21-9/23- NSTEMI    Hx MI and stent in April. ED 10/13- c/o itching in legs, attributed to steroid injection. Rx'd with Vistaril She had concern about possible covid symptoms and was sent to testing yesterday, results pending. -----pt got tested for COVID yesterday 05/18/2019; states she is feeling better today She has been dx'd with 2 MI's this year, followed by cardiology. ER twice yesterday related to itching legs she blamed on itching after steroid injection on Monday. Steroid made her hyper. Vistaril inj helped a lot and she asked for po equivalent to have available.  Ok to get flu vax as planned.  Observations/Objective: IgE and CBC diff are peninding  Assessment and Plan: Allergy- suggest trying  chlor-Trimeton if pharmacy can get her a liquid with no corn filler.  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 Obesity, Hyperlipidemia, Migraine, Hiatal Hernia, GERD/ Barretts,  Covid vax- 2 Phizer Flu vax-- today senior ED visit last week for laceration after falling over furniture. Asks refill cough syrup, emphasizing she uses it very sparingly. No acute respsiratory issues, little cough currently but sensitive to weather changes. Sensitive to smoke, burning leaves- eyes water, skin gets red. She has a long history of unusual reactions/ sensitivity, or unusual interpretation of response to environmental triggers.  No recent cardiac events  CXR 1V 04/25/2019-  FINDINGS: The heart size and mediastinal contours are within normal limits. Both lungs are clear. The visualized skeletal structures are unremarkable. IMPRESSION: No active disease.  ROS-see HPI   + = positive Constitutional:    weight loss, night sweats, fevers, chills, fatigue, lassitude. HEENT:    headaches, difficulty swallowing, tooth/dental problems, sore throat,       sneezing, itching, ear ache, nasal congestion, post nasal drip, snoring CV:    chest pain, orthopnea, PND, swelling in lower extremities, anasarca,                                  dizziness, palpitations Resp:   shortness of breath with exertion or at rest.                productive cough,   non-productive cough, coughing up of blood.  change in color of mucus.  wheezing.   Skin:    rash or lesions. GI:  No-   heartburn, indigestion, abdominal pain, nausea, vomiting, diarrhea,                 change in bowel habits, loss of appetite GU: dysuria, change in color of urine, no urgency or frequency.   flank pain. MS:   joint pain, stiffness, decreased range of motion, back pain. Neuro-      nothing unusual Psych:  change in mood or affect.  depression or anxiety.   memory loss.  OBJ- Physical Exam General- Alert, Oriented, Affect-appropriate, Distress- none acute Skin- rash-none, lesions- none, excoriation- none Lymphadenopathy- none Head- + sutured laceration R forehead            Eyes- Gross vision intact, PERRLA, conjunctivae and secretions clear            Ears- Hearing, canals-normal            Nose- Clear, no-Septal dev, mucus, polyps, erosion, perforation             Throat- Mallampati II , mucosa clear , drainage- none, tonsils- atrophic Neck- flexible , trachea midline, no stridor , thyroid nl, carotid no bruit Chest - symmetrical excursion , unlabored           Heart/CV- RRR , no murmur , no gallop  , no rub, nl s1 s2                           - JVD- none , edema- none, stasis changes- none, varices- none           Lung- clear to P&A, wheeze- none, cough- none , dullness-none, rub- none           Chest wall-  Abd-  Br/ Gen/ Rectal- Not done, not indicated Extrem- cyanosis- none, clubbing, none, atrophy- none, strength- nl Neuro- grossly intact to observation

## 2020-05-03 NOTE — Telephone Encounter (Signed)
Pt ok for EGD and colon, see note below. Please schedule.

## 2020-05-03 NOTE — Telephone Encounter (Signed)
Chart reviewed.  Okay to add on EGD for Barrett's and GERD

## 2020-05-30 NOTE — Telephone Encounter (Signed)
Spoke with pt to schedule, pt would like to schedule in January, pt will call back

## 2020-06-08 ENCOUNTER — Ambulatory Visit: Payer: Medicare Other | Attending: Internal Medicine

## 2020-06-08 DIAGNOSIS — Z23 Encounter for immunization: Secondary | ICD-10-CM

## 2020-06-08 NOTE — Progress Notes (Signed)
   Covid-19 Vaccination Clinic  Name:  Donna Bailey    MRN: [id number] DOB: 11/16/1945  06/08/2020  [name] was observed post Covid-19 immunization for 15 minutes without incident. She was provided with Vaccine Information Sheet and instruction to access the V-Safe system.   [name] was instructed to call 911 with any severe reactions post vaccine: Marland Kitchen Difficulty breathing  . Swelling of face and throat  . A fast heartbeat  . A bad rash all over body  . Dizziness and weakness

## 2020-07-31 ENCOUNTER — Other Ambulatory Visit: Payer: Self-pay

## 2020-07-31 ENCOUNTER — Other Ambulatory Visit: Payer: Self-pay | Admitting: Internal Medicine

## 2020-07-31 NOTE — Telephone Encounter (Signed)
Couh syrup refilled

## 2020-08-01 MED ORDER — GUAIFENESIN-CODEINE 100-10 MG/5ML PO SOLN: ORAL | 0 refills | Status: DC

## 2020-08-01 NOTE — Telephone Encounter (Signed)
Cough syrup script sent to [facility]

## 2020-08-01 NOTE — Telephone Encounter (Signed)
Dr. Maple Hudson please advise on requested cough syrup refill.  Thanks!

## 2020-09-03 ENCOUNTER — Telehealth: Payer: Self-pay | Admitting: Internal Medicine

## 2020-09-03 MED ORDER — PROMETHAZINE-CODEINE 6.25-10 MG/5ML PO SYRP: 5.0000 mL | ORAL_SOLUTION | Freq: Four times a day (QID) | ORAL | 0 refills | Status: DC | PRN

## 2020-09-03 NOTE — Telephone Encounter (Signed)
I sent promethazine codeine cough syrup to [facility]

## 2020-09-03 NOTE — Telephone Encounter (Signed)
Called and spoke with Patient.  Patient stated she is taking Clindamycin for recent oral surgery.  Patient stated she has 3 days left of Clindamycin. Patient stated she had oral surgery 5 days ago, and was placed on pain meds for two days.   Patient stated she is having sinus drainage and a productive cough, with yellow sputum. Patient is requesting cough medication that would suppress her cough and be soothing for her throat, and mouth. Patient request prescription to be sent to [facility].  Allergies  Allergen Reactions  . Cephalosporins Itching    Other reaction(s): Other (See Comments) unknown  . Crestor [Rosuvastatin] Other (See Comments)    Lost all muscle mobility   . Doxycycline Diarrhea and Nausea And Vomiting    Other reaction(s): Other (See Comments)  . Formoterol Other (See Comments)    Reaction not recalled  . Other Anaphylaxis, Itching, Rash and Other (See Comments)    Corn fillers, corn by-products - causes severe itching Polyester-"pins sticking in her skin   . Sulfa Antibiotics Anaphylaxis  . Sulfonamide Derivatives Anaphylaxis  . Ciprofloxacin Rash and Other (See Comments)  . Nitrofurantoin Diarrhea, Nausea And Vomiting and Other (See Comments)    Also, "convulsions/constant shaking"- per patient  . Penicillins Rash    Underarms (both) Has patient had a PCN reaction causing immediate rash, facial/tongue/throat swelling, SOB or lightheadedness with hypotension: YES Has patient had a PCN reaction causing severe rash involving mucus membranes or skin necrosis: NO Has patient had a PCN reaction that required hospitalization NO Has patient had a PCN reaction occurring within the last 10 years: NO If all of the above answers are "NO", then may proceed with Cephalosporin use. Other reaction(s): Other (See Comments) Underarms (both) Other Reaction: "red hot skin" Underarms (both) Has patient had a PCN reaction causing immediate rash, facial/tongue/throat swelling,  SOB or lightheadedness with hypotension: YES Has patient had a PCN reaction causing severe rash involving mucus membranes or skin necrosis: NO Has patient had a PCN reaction that required hospitalization NO Has patient had a PCN reaction occurring within the last 10 years: NO If all of the above answers are "NO", then may proceed with Cephalosporin use.  . Prednisone Itching    Pt states she cannot take prednisone with corn filler 05/19/2019.  Marland Kitchen Brilinta [Ticagrelor]     headache  . Cephalexin Other (See Comments)    Reaction not recalled by the patient  . Formoterol Fumarate Other (See Comments)    Reaction not recalled  . Gold Sodium Thiosulfate Other (See Comments)    Positive patch test  . Gold-Containing Drug Products Other (See Comments)    Skin tingles  . Levofloxacin In D5w Other (See Comments)    Other Reaction: racing heart Other reaction(s): Other (See Comments) Other Reaction: racing heart  . Macrodantin [Nitrofurantoin Macrocrystal] Itching  . Moxifloxacin Other (See Comments)    Caused hands to "shake"  . Neomycin Other (See Comments)    Doesn't remember - allergist said not to use it because it could add more allergies- Positive patch test   . Ofloxacin Itching  . Praluent [Alirocumab]     shaking  . Ranexa [Ranolazine Er]     itching  . Repatha [Evolocumab]     shaking  . Valsartan Other (See Comments)    Pt reports this med causes her to feel sluggish and she feels heaviness on her shoulders.  Earnestine Mealing [Colesevelam]   . Zetia [Ezetimibe]   . Acetaminophen Rash and  Other (See Comments)    Facial rash  . Fexofenadine Palpitations and Other (See Comments)    Heart races  . Ibuprofen Rash  . Latex Rash and Other (See Comments)    Skin gets red   . Levofloxacin Palpitations and Other (See Comments)    HEART RACING  . Mold Extract [Trichophyton Mentagrophyte] Other (See Comments)    Bumps on back, stops up sinuses.  . Molds & Smuts Rash and Other (See  Comments)    Bumps on back, stops up sinuses.  . Nickel Rash  . Tape Rash  . Trichophyton Rash and Other (See Comments)    Bumps on back, stops up sinuses   Current Outpatient Medications on File Prior to Visit  Medication Sig Dispense Refill  . Alirocumab (PRALUENT) 75 MG/ML SOAJ Inject 1 pen every 14-28 days as tolerated (Patient not taking: Reported on 05/02/2020) 2 pen 11  . Ascorbic Acid (VITAMIN C) 100 MG tablet Take 100 mg by mouth daily.    Marland Kitchen aspirin EC 81 MG tablet Take 81 mg by mouth daily.     Marland Kitchen atorvastatin (LIPITOR) 10 MG tablet TAKE 1 TABLET BY MOUTH EVERY DAY 90 tablet 1  . carvedilol (COREG) 6.25 MG tablet TAKE 1 TABLET TWICE A DAY WITH A MEAL 180 tablet 2  . Cyanocobalamin (VITAMIN B-12 IJ) Inject 1,000 mg as directed every 30 (thirty) days.    . diphenhydrAMINE (BENADRYL) 25 mg capsule Take 25 mg by mouth every 6 (six) hours as needed for itching or allergies.     Marland Kitchen esomeprazole (NEXIUM) 40 MG capsule Take 40 mg by mouth daily at 12 noon.    . furosemide (LASIX) 20 MG tablet Take 1 tablet (20 mg total) by mouth daily as needed for fluid. 90 tablet 3  . Gabapentin 10 % CREA Apply 1-2 application topically at bedtime.    Marland Kitchen guaiFENesin-codeine 100-10 MG/5ML syrup TAKE 1 TEASPOONFUL 3 TIMES DAILY AS NEEDED FOR COUGH 180 mL 0  . hydrOXYzine (VISTARIL) 25 MG capsule Take 50 mg by mouth every 8 (eight) hours as needed for anxiety.     . lidocaine (LIDODERM) 5 % SMARTSIG:0-3 Patch(s) Topical As Directed    . nitroGLYCERIN (NITROSTAT) 0.4 MG SL tablet Place 1 tablet (0.4 mg total) under the tongue every 5 (five) minutes as needed for chest pain. 25 tablet 2  . ondansetron (ZOFRAN) 4 MG tablet Take 1 tablet (4 mg total) by mouth every 8 (eight) hours as needed for nausea or vomiting. 4 tablet 0  . TYROSINE PO Take by mouth.    . zolpidem (AMBIEN) 10 MG tablet Take 1 tablet (10 mg total) by mouth at bedtime as needed for sleep. 30 tablet 5   No current facility-administered  medications on file prior to visit.   Message routed to Dr. [name] to advise

## 2020-09-03 NOTE — Telephone Encounter (Signed)
Attempted to call pt but unable to reach. Left detailed message for pt letting her know that CY sent med to pharmacy for pt. Nothing further needed.

## 2020-09-04 ENCOUNTER — Encounter: Payer: Self-pay | Admitting: Internal Medicine

## 2020-09-11 ENCOUNTER — Other Ambulatory Visit: Payer: Self-pay

## 2020-09-11 ENCOUNTER — Ambulatory Visit (INDEPENDENT_AMBULATORY_CARE_PROVIDER_SITE_OTHER): Payer: Medicare Other | Admitting: Cardiology

## 2020-09-11 ENCOUNTER — Encounter: Payer: Self-pay | Admitting: Cardiology

## 2020-09-11 ENCOUNTER — Encounter: Payer: Self-pay | Admitting: *Deleted

## 2020-09-11 VITALS — BP 132/74 | HR 74 | Ht 66.0 in | Wt 186.0 lb

## 2020-09-11 DIAGNOSIS — E785 Hyperlipidemia, unspecified: Secondary | ICD-10-CM | POA: Diagnosis not present

## 2020-09-11 DIAGNOSIS — I25119 Atherosclerotic heart disease of native coronary artery with unspecified angina pectoris: Secondary | ICD-10-CM | POA: Diagnosis not present

## 2020-09-11 DIAGNOSIS — I2510 Atherosclerotic heart disease of native coronary artery without angina pectoris: Secondary | ICD-10-CM

## 2020-09-11 DIAGNOSIS — I10 Essential (primary) hypertension: Secondary | ICD-10-CM | POA: Diagnosis not present

## 2020-09-11 DIAGNOSIS — R079 Chest pain, unspecified: Secondary | ICD-10-CM

## 2020-09-11 MED ORDER — RED YEAST RICE 600 MG PO TABS: 600.0000 mg | ORAL_TABLET | Freq: Every day | ORAL | 1 refills | Status: DC

## 2020-09-11 MED ORDER — HYDRALAZINE HCL 25 MG PO TABS
25.0000 mg | ORAL_TABLET | Freq: Every day | ORAL | 3 refills | Status: DC | PRN
Start: 2020-09-11 — End: 2023-06-07

## 2020-09-11 MED ORDER — FISH OIL 1000 MG PO CAPS: 1000.0000 mg | ORAL_CAPSULE | Freq: Every day | ORAL | 1 refills | Status: DC

## 2020-09-11 NOTE — Progress Notes (Signed)
Cardiology Office Note:    Date:  09/11/2020   ID:  Donna Bailey, DOB [birth year], MRN [id number]  PCP:  [name], MD  Cardiologist:  Ena Dawley, MD   Electrophysiologist:  None   Referring MD: [name], MD   Chief Complaint: chest pain, intolerance to Praluent  Patient Profile:    Donna Bailey is a 75 y.o. female with:   Coronary artery disease ? S/p Anterior STEMI 4/19 >> s/p DES to LAD  C/b radial artery dissection and hematoma  ? Cath 6/19: LAD stent patent, D1 branches w mod to severe dz (not amenable to PCI), mod RCA dz, oLCx 60 (neg FFR) >> Med Rx ? Myoview 09/2018: low risk ? Cath 11/2018: LAD stent patent, no change since 6/19 ? NSTEMI 04/2019 in setting of profound hypotension due to n/v/d, diuretic use, SL NTG use >> Cath w/ patent LAD stent, mod oLCx, mDx branch, RCA stenosis (unchanged) >> Med Rx   Ischemic CM ? EF returned to normal [40-45 (4/19) >> 65 (5/19)]  Chronic kidney disease stage 2  GERD  Barrett's Esophagus  Hyperlipidemia  ? Intol of statins, PCSK9 inhibitors  Asthma   Multiple Med Intolerances/Allergies  ? Unable to take Plavix, Amlodipine due to allergy to corn filler ? DC'd Praluent >> shaking ? DC'd Ranolazine >> Itching ? DC'd Valsartan >> multiple symptoms ? Seen by allergist; Food panel w/ corn, red dye negative   Prior CV studies: Myoview 11/23/2019 EF 61, normal perfusion, low risk   Cardiac catheterization 04/26/2019 LM normal  LAD prox stent patent w 25 ISR; D1 60; Lat D1 50 LCx 40 RCA mid 40 EF 55-65  Event Monitor 01/2019 Normal sinus rhythm, sinus tachycardia No arrhythmias, pauses  Cardiac Catheterization4/24/2020 LM normal LAD prox stent patent; D1 60; Lat D1 50 LCx ost 60 RCA mid 40 EF > 65  Myoview 09/09/2018 Normal stress nuclear study with no ischemia or infarction; EF 69 with normal wall motion.  Cardiac catheterization 01/22/2018 Widely patent stent in mid LAD and  stable moderate to severe disease involving branches of D1. Moderate, non-obstructive mid RCA disease. 60% ostial LCx stenosis with minimal luminal area of 5.1 cm^2 by IVUS and FFR 0.85.  Echo 12/04/2017 No new wall motion abnormalities, EF 65, no pericardial effusion, normal aortic root  Echo 11/14/17 EF 40-45, apical ant-sept, inf, inf-sept, apical HK c/w ischemia in the distribution of the LAD  Cardiac Catheterization4/12/19 LM normal LAD prox 100, D1 80, Lat D1 50 LCx ost 90 RCA mid 25 PCI: 2.75 x 23 mm Sierra DES to LAD  History of Present Illness:    Donna Bailey was last seen via Telemedicine in 11/2019.  She was [facility] to [facility] to help with her cholesterol management in the setting of multiple drug intolerances. She also had symptoms of bilateral arm heaviness.  A Myoview was obtained and this was low risk with normal perfusion.  She returns for follow up.  She is here alone. She notes that her husband has passed out again. He has issues with orthostasis. She has been doing well without chest discomfort, significant shortness of breath or syncope. She has not had significant leg swelling. She did not try Nexletol due to prior history of tendinitis.  The patient is coming after 6 months, she is intolerant to 37 medications including goal of statins and now Praluent that gave her tremor, she has been experiencing minutes lasting chest pressure similar to her pain prior  to her intervention.  This is sometimes related to hypertension.  She does not take blood pressure medication a regular basis as her blood pressure fluctuates.  No dizziness no falls no lower extremity edema.  Past Medical History:  Diagnosis Date  . Allergic rhinitis, cause unspecified   . Anemia   . Aneurysm of right conjunctiva    right eye   . Anxiety   . Asthma   . Barrett's esophagus   . CAD in native artery    a. 11/2017: STEMI s/p DES to prox-mid LAD; LCx stenosis managed medically. Case  complicated by R forearm hematoma. b. Several subsequent caths, last in 04/2019 with stable disease // Myoview 11/2019: EF 61, normal perfusion; low risk   . CKD (chronic kidney disease), stage II   . Constipation   . Cystocele   . Deviated nasal septum   . Diaphragmatic hernia without mention of obstruction or gangrene   . Esophageal reflux   . Fibromyalgia   . H/O hiatal hernia   . Hypertension   . Insomnia, unspecified   . Ischemic cardiomyopathy    a. EF 40-45% by echo 11/2017. // Echo 5/19: No new wall motion abnormalities, EF 65, no pericardial effusion, normal aortic root  . Kidney stones    hx of pt see Dr. Risa Grill  . Medication intolerance    numerous  . Migraine   . Myalgia and myositis, unspecified   . Nuclear stress tests    Cardiolite 2/18: no ischemia or scar, EF 78; Low Risk  . Osteoarthrosis, unspecified whether generalized or localized, unspecified site   . Peripheral neuropathy   . Pneumonia    hx of  . Pure hypercholesterolemia   . Rectocele   . Scoliosis (and kyphoscoliosis), idiopathic   . Statin intolerance   . Temporomandibular joint disorders, unspecified   . Urticaria   . Wheat allergy     Current Medications: Current Meds  Medication Sig  . Ascorbic Acid (VITAMIN C) 100 MG tablet Take 100 mg by mouth daily.  . Cyanocobalamin (VITAMIN B-12 IJ) Inject 1,000 mg as directed every 30 (thirty) days.  . diphenhydrAMINE (BENADRYL) 25 mg capsule Take 25 mg by mouth every 6 (six) hours as needed for itching or allergies.   Marland Kitchen esomeprazole (NEXIUM) 40 MG capsule Take 40 mg by mouth daily at 12 noon.  . furosemide (LASIX) 20 MG tablet Take 1 tablet (20 mg total) by mouth daily as needed for fluid.  . Gabapentin 10 % CREA Apply 1-2 application topically at bedtime.  Marland Kitchen guaiFENesin-codeine 100-10 MG/5ML syrup TAKE 1 TEASPOONFUL 3 TIMES DAILY AS NEEDED FOR COUGH  . hydrALAZINE (APRESOLINE) 25 MG tablet Take 1 tablet (25 mg total) by mouth daily as needed (for BP  greater than 425 systolic).  . hydrOXYzine (VISTARIL) 25 MG capsule Take 50 mg by mouth every 8 (eight) hours as needed for anxiety.   . nitroGLYCERIN (NITROSTAT) 0.4 MG SL tablet Place 1 tablet (0.4 mg total) under the tongue every 5 (five) minutes as needed for chest pain.  . Omega-3 Fatty Acids (FISH OIL) 1000 MG CAPS Take 1 capsule (1,000 mg total) by mouth daily.  . ondansetron (ZOFRAN) 4 MG tablet Take 1 tablet (4 mg total) by mouth every 8 (eight) hours as needed for nausea or vomiting.  . promethazine-codeine (PHENERGAN WITH CODEINE) 6.25-10 MG/5ML syrup Take 5 mLs by mouth every 6 (six) hours as needed for cough.  . Red Yeast Rice 600 MG TABS Take 1 tablet (  600 mg total) by mouth daily.  . TYROSINE PO Take by mouth.     Allergies:   Cephalosporins, Crestor [rosuvastatin], Doxycycline, Formoterol, Other, Sulfa antibiotics, Sulfonamide derivatives, Ciprofloxacin, Nitrofurantoin, Penicillins, Prednisone, Brilinta [ticagrelor], Cephalexin, Formoterol fumarate, Gold sodium thiosulfate, Gold-containing drug products, Levofloxacin in d5w, Macrodantin [nitrofurantoin macrocrystal], Moxifloxacin, Neomycin, Ofloxacin, Praluent [alirocumab], Ranexa [ranolazine er], Repatha [evolocumab], Valsartan, Welchol [colesevelam], Zetia [ezetimibe], Acetaminophen, Fexofenadine, Ibuprofen, Latex, Levofloxacin, Mold extract [trichophyton mentagrophyte], Molds & smuts, Nickel, Tape, and Trichophyton   Social History   Tobacco Use  . Smoking status: Never Smoker  . Smokeless tobacco: Never Used  Vaping Use  . Vaping Use: Never used  Substance Use Topics  . Alcohol use: No    Alcohol/week: 0.0 standard drinks  . Drug use: No     Family Hx: The patient's family history includes Breast cancer in her sister; Cancer in her sister; Cancer (age of onset: 63) in her sister; Colitis in her mother; Colon polyps in her brother; Diverticulosis in her mother; Heart disease in her father and mother; Leukemia in her  sister; Tuberculosis in her brother. There is no history of Colon cancer, Esophageal cancer, Rectal cancer, or Stomach cancer.  ROS   EKGs/Labs/Other Test Reviewed:    EKG:  EKG is   ordered today.  The ekg ordered today demonstrates normal sinus rhythm, 74 bpm, normal EKG unchanged from prior.  This was personally reviewed. Recent Labs: 12/23/2019: ALT 11   Recent Lipid Panel Lab Results  Component Value Date/Time   CHOL 210 (H) 12/23/2019 08:02 AM   TRIG 83 12/23/2019 08:02 AM   HDL 51 12/23/2019 08:02 AM   CHOLHDL 4.1 12/23/2019 08:02 AM   CHOLHDL 4.9 12/02/2017 09:33 PM   LDLCALC 144 (H) 12/23/2019 08:02 AM    Physical Exam:    VS:  BP 132/74   Pulse 74   Ht 5\' 6"  (1.676 m)   Wt 186 lb (84.4 kg)   SpO2 96%   BMI 30.02 kg/m     Wt Readings from Last 3 Encounters:  09/11/20 186 lb (84.4 kg)  05/02/20 186 lb 3.2 oz (84.5 kg)  03/22/20 185 lb 9.6 oz (84.2 kg)     Constitutional:      Appearance: Healthy appearance. Not in distress.  Neck:     Thyroid: No thyromegaly.     Vascular: JVD normal.  Pulmonary:     Effort: Pulmonary effort is normal.     Breath sounds: No wheezing. No rales.  Cardiovascular:     Normal rate. Regular rhythm. Normal S1. Normal S2.     Murmurs: There is no murmur.  Edema:    Peripheral edema absent.  Abdominal:     Palpations: Abdomen is soft. There is no hepatomegaly.  Skin:    General: Skin is warm and dry.  Neurological:     Mental Status: Alert and oriented to person, place and time.     Cranial Nerves: Cranial nerves are intact.       ASSESSMENT & PLAN:    1. Coronary artery disease involving native coronary artery of native heart with angina pectoris [facility]) She has a history of anterior ST elevation myocardial infarction in April 2019 treated with a drug-eluting stent to the LAD.  She has had multiple heart catheterizations since that time.  As noted, she also has multiple drug intolerances.  She underwent cardiac  catheterization in June 2019 as well as April 2020.  Her LAD stent was patent and she had moderate disease  elsewhere.  She does have LCx stenosis that has been evaluated by FFR.  This was not felt to be hemodynamically significant.   She is now experiencing recurrent chest pain and would like to be evaluated by stress test.  We will arrange for 1, she is willing to try red yeast rice and fish oil over-the-counter again.  She was intolerant to Praluent.  2. Essential hypertension Her blood pressure fluctuates, she gets low blood pressures but also systolic blood pressure in 160s to 190s associated with chest pain.  We will prescribe her hydralazine 25 mg p.o. daily as needed.  3. Hyperlipidemia LDL goal <70 She has multiple drug intolerances eluding all the statins and Praluent and Zetia, she is willing to try red yeast rice and fish oil again.   Dispo:  No follow-ups on file.   Medication Adjustments/Labs and Tests Ordered: Current medicines are reviewed at length with the patient today.  Concerns regarding medicines are outlined above.  Tests Ordered: Orders Placed This Encounter  Procedures  . MYOCARDIAL PERFUSION IMAGING  . EKG 12-Lead   Medication Changes: Meds ordered this encounter  Medications  . Red Yeast Rice 600 MG TABS    Sig: Take 1 tablet (600 mg total) by mouth daily.    Dispense:  90 tablet    Refill:  1  . Omega-3 Fatty Acids (FISH OIL) 1000 MG CAPS    Sig: Take 1 capsule (1,000 mg total) by mouth daily.    Dispense:  90 capsule    Refill:  1  . hydrALAZINE (APRESOLINE) 25 MG tablet    Sig: Take 1 tablet (25 mg total) by mouth daily as needed (for BP greater than 410 systolic).    Dispense:  30 tablet    Refill:  3    Signed, Ena Dawley, MD  09/11/2020 8:51 AM    [facility] [address], [place], [place]  [address] Phone: [phone]; Fax: [phone]

## 2020-09-11 NOTE — Patient Instructions (Signed)
Medication Instructions:   START TAKING RED YEAST RICE 600 MG BY MOUTH DAILY--YOU CAN BUY THIS OVER-THE-COUNTER  START TAKING FISH OIL 1,000 MG BY MOUTH DAILY--YOU CAN OBTAIN THIS OVER-THE-COUNTER  START TAKING HYDRALAZINE 25 MG BY MOUTH DAILY ONLY AS NEEDED FOR BLOOD PRESSURE GREATER THAN 830 SYSTOLIC (TOP NUMBER IN BP)   *If you need a refill on your cardiac medications before your next appointment, please call your pharmacy*   Testing/Procedures:  Your physician has requested that you have a lexiscan myoview. For further information please visit [email or web address]. Please follow instruction sheet, as given.   Follow-Up: At [facility], you and your health needs are our priority.  As part of our continuing mission to provide you with exceptional heart care, we have created designated Provider Care Teams.  These Care Teams include your primary Cardiologist (physician) and Advanced Practice Providers (APPs -  Physician Assistants and Nurse Practitioners) who all work together to provide you with the care you need, when you need it.  We recommend signing up for the patient portal called "MyChart".  Sign up information is provided on this After Visit Summary.  MyChart is used to connect with patients for Virtual Visits (Telemedicine).  Patients are able to view lab/test results, encounter notes, upcoming appointments, etc.  Non-urgent messages can be sent to your provider as well.   To learn more about what you can do with MyChart, go to [email or web address].    Your next appointment:   6 month(s)  The format for your next appointment:   In Person  Provider:   You may see Dr. Gasper Sells or care team or one of the following Advanced Practice Providers on your designated Care Team:

## 2020-09-12 ENCOUNTER — Telehealth (HOSPITAL_COMMUNITY): Payer: Self-pay | Admitting: *Deleted

## 2020-09-12 NOTE — Telephone Encounter (Signed)
Patient given detailed instructions per Myocardial Perfusion Study Information Sheet for the test on  09/17/20. Patient notified to arrive 15 minutes early and that it is imperative to arrive on time for appointment to keep from having the test rescheduled.  If you need to cancel or reschedule your appointment, please call the office within 24 hours of your appointment. . Patient verbalized understanding. Donna Bailey

## 2020-09-14 ENCOUNTER — Ambulatory Visit (HOSPITAL_COMMUNITY): Payer: Medicare Other | Attending: Cardiovascular Disease

## 2020-09-14 ENCOUNTER — Other Ambulatory Visit: Payer: Self-pay

## 2020-09-14 DIAGNOSIS — R079 Chest pain, unspecified: Secondary | ICD-10-CM | POA: Diagnosis not present

## 2020-09-14 DIAGNOSIS — I25119 Atherosclerotic heart disease of native coronary artery with unspecified angina pectoris: Secondary | ICD-10-CM | POA: Diagnosis present

## 2020-09-14 DIAGNOSIS — I10 Essential (primary) hypertension: Secondary | ICD-10-CM | POA: Diagnosis present

## 2020-09-14 DIAGNOSIS — I2510 Atherosclerotic heart disease of native coronary artery without angina pectoris: Secondary | ICD-10-CM | POA: Diagnosis present

## 2020-09-14 LAB — MYOCARDIAL PERFUSION IMAGING
LV dias vol: 51 mL (ref 46–106)
LV sys vol: 18 mL
Peak HR: 93 {beats}/min
Rest HR: 65 {beats}/min
SDS: 3
SRS: 0
SSS: 3
TID: 1.01

## 2020-09-14 MED ORDER — REGADENOSON 0.4 MG/5ML IV SOLN
0.4000 mg | Freq: Once | INTRAVENOUS | Status: AC
Start: 2020-09-14 — End: 2020-09-14
  Administered 2020-09-14: 0.4 mg via INTRAVENOUS

## 2020-09-14 MED ORDER — TECHNETIUM TC 99M TETROFOSMIN IV KIT
10.8000 | PACK | Freq: Once | INTRAVENOUS | Status: AC | PRN
  Administered 2020-09-14: 10.8 via INTRAVENOUS
  Filled 2020-09-14: qty 11

## 2020-09-14 MED ORDER — TECHNETIUM TC 99M TETROFOSMIN IV KIT
32.3000 | PACK | Freq: Once | INTRAVENOUS | Status: AC | PRN
  Administered 2020-09-14: 32.3 via INTRAVENOUS
  Filled 2020-09-14: qty 33

## 2020-09-17 ENCOUNTER — Encounter (HOSPITAL_COMMUNITY): Payer: Medicare Other

## 2020-09-26 ENCOUNTER — Encounter (HOSPITAL_COMMUNITY): Payer: Medicare Other

## 2020-09-28 ENCOUNTER — Ambulatory Visit: Payer: Medicare Other | Admitting: Internal Medicine

## 2020-10-01 ENCOUNTER — Other Ambulatory Visit: Payer: Self-pay | Admitting: Internal Medicine

## 2020-10-02 LAB — LIPID PANEL
Chol/HDL Ratio: 4.8 ratio — ABNORMAL HIGH (ref 0.0–4.4)
Cholesterol, Total: 257 mg/dL — ABNORMAL HIGH (ref 100–199)
HDL: 53 mg/dL (ref >39)
LDL Chol Calc (NIH): 184 mg/dL — ABNORMAL HIGH (ref 0–99)
Triglycerides: 113 mg/dL (ref 0–149)
VLDL Cholesterol Cal: 20 mg/dL (ref 5–40)

## 2020-10-03 ENCOUNTER — Ambulatory Visit (INDEPENDENT_AMBULATORY_CARE_PROVIDER_SITE_OTHER): Payer: Medicare Other | Admitting: Internal Medicine

## 2020-10-03 ENCOUNTER — Encounter: Payer: Self-pay | Admitting: Internal Medicine

## 2020-10-03 ENCOUNTER — Other Ambulatory Visit: Payer: Self-pay

## 2020-10-03 VITALS — BP 144/82 | HR 76 | Ht 66.0 in | Wt 189.0 lb

## 2020-10-03 DIAGNOSIS — I2510 Atherosclerotic heart disease of native coronary artery without angina pectoris: Secondary | ICD-10-CM

## 2020-10-03 DIAGNOSIS — T466X5D Adverse effect of antihyperlipidemic and antiarteriosclerotic drugs, subsequent encounter: Secondary | ICD-10-CM | POA: Diagnosis not present

## 2020-10-03 DIAGNOSIS — T466X5A Adverse effect of antihyperlipidemic and antiarteriosclerotic drugs, initial encounter: Secondary | ICD-10-CM

## 2020-10-03 DIAGNOSIS — E785 Hyperlipidemia, unspecified: Secondary | ICD-10-CM

## 2020-10-03 DIAGNOSIS — M791 Myalgia, unspecified site: Secondary | ICD-10-CM
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
## 2020-10-03 NOTE — Patient Instructions (Signed)
Medication Instructions:  Dr. Debara Pickett has recommended [name])  Your Leqvio injection is scheduled on ________ at _______. Please arrive at [facility] and enter the hospital through [place] (the [employer]) that's located at [employer] 15 minutes before your scheduled injection time. Walk in to the registration desk and let them know that you are scheduled for an injection in the infusion center. They will register you and take you to your appointment.   Donna Bailey is a subcutaneous injection that will lower your LDL cholesterol by 50%. If this is your first injection, your next injection will be due in 3 months. If this is NOT your first injection, your next injection will be due in 6 months. If you have not heard from [facility] to schedule your next appointment within 2 weeks of your next anticipated injection, please call [facility] to schedule this at:                [phone] - if you are seen at the [facility] office               [phone] - if you are seen at the [facility] office   *If you need a refill on your cardiac medications before your next appointment, please call your pharmacy*   Lab Work: FASTING lipid panel in about 4 months to check cholesterol   If you have labs (blood work) drawn today and your tests are completely normal, you will receive your results only by: Marland Kitchen MyChart Message (if you have MyChart) OR . A paper copy in the mail If you have any lab test that is abnormal or we need to change your treatment, we will call you to review the results.   Testing/Procedures: NONE   Follow-Up: At [facility], you and your health needs are our priority.  As part of our continuing mission to provide you with exceptional heart care, we have created designated Provider Care Teams.  These Care Teams include your primary Cardiologist (physician) and Advanced Practice Providers (APPs -  Physician Assistants and Nurse  Practitioners) who all work together to provide you with the care you need, when you need it.  We recommend signing up for the patient portal called "MyChart".  Sign up information is provided on this After Visit Summary.  MyChart is used to connect with patients for Virtual Visits (Telemedicine).  Patients are able to view lab/test results, encounter notes, upcoming appointments, etc.  Non-urgent messages can be sent to your provider as well.   To learn more about what you can do with MyChart, go to [email or web address].    Your next appointment:   4-5 month(s) - lipid clinic  The format for your next appointment:   In Person  Provider:   K. Mali Hilty, MD   Other Instructions

## 2020-10-05 ENCOUNTER — Telehealth: Payer: Self-pay | Admitting: Internal Medicine

## 2020-10-05 NOTE — Telephone Encounter (Signed)
Patient is returning call. She states the call she received this morning may have been from the benefits investigation company. She states she is no longer able to access the phone number that was left with the voicemail. She would like to know if we have a phone number for the [place]. Please advise.

## 2020-10-05 NOTE — Telephone Encounter (Signed)
Patient states she is returning a call to schedule a leqvio injection.

## 2020-10-05 NOTE — Telephone Encounter (Signed)
Left message for patient that her application has been submitted for benefits investigation for Leqvio. This is in progress per portal

## 2020-10-05 NOTE — Telephone Encounter (Signed)
LEQVIO (inclisiran) [place] Complete Enrollment Notification Call: [phone]   Left message for patient with this info

## 2020-10-08 NOTE — Telephone Encounter (Signed)
Spoke with the patient. She stated that she feels like she missed a call to get the injection scheduled. She was calling the office to see if we knew anything getting this scheduled.

## 2020-10-08 NOTE — Telephone Encounter (Signed)
Patient calling to see if she needs to schedule her injection.

## 2020-10-09 NOTE — Telephone Encounter (Signed)
Spoke with patient re: Donna Bailey  Explained that I spoke with service center for benefits investigation this morning to confirm her insurance info  Explained that we are unable to arrange an appointment at this time, as benefits is not squared away

## 2020-10-17 ENCOUNTER — Telehealth: Payer: Self-pay | Admitting: Internal Medicine

## 2020-10-17 NOTE — Telephone Encounter (Signed)
Courtney with [employer] is calling to see if the authorization for the patients Donna Bailey has been initiated yet. Aware Eliezer Lofts is off today and will call tomorrow.  Contact info: [phone] ext [phone]

## 2020-10-19 ENCOUNTER — Other Ambulatory Visit: Payer: Self-pay | Admitting: *Deleted

## 2020-10-19 DIAGNOSIS — M25561 Pain in right knee: Secondary | ICD-10-CM

## 2020-10-23 ENCOUNTER — Ambulatory Visit (INDEPENDENT_AMBULATORY_CARE_PROVIDER_SITE_OTHER): Payer: Medicare Other | Admitting: Vascular Surgery

## 2020-10-23 ENCOUNTER — Encounter: Payer: Self-pay | Admitting: Vascular Surgery

## 2020-10-23 ENCOUNTER — Other Ambulatory Visit: Payer: Self-pay

## 2020-10-23 ENCOUNTER — Ambulatory Visit (HOSPITAL_COMMUNITY)
Admission: RE | Admit: 2020-10-23 | Discharge: 2020-10-23 | Disposition: A | Discharge status: Home / Self Care | Payer: Medicare Other | Source: Ambulatory Visit | Attending: Vascular Surgery | Admitting: Vascular Surgery

## 2020-10-23 DIAGNOSIS — M25562 Pain in left knee: Secondary | ICD-10-CM | POA: Insufficient documentation

## 2020-10-23 DIAGNOSIS — I872 Venous insufficiency (chronic) (peripheral): Secondary | ICD-10-CM | POA: Insufficient documentation

## 2020-10-23 DIAGNOSIS — M25561 Pain in right knee: Secondary | ICD-10-CM | POA: Diagnosis present
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
## 2020-11-02 NOTE — Telephone Encounter (Signed)
Spoke with [facility] (phon: [phone]) and submitted PA request via phone Necessary info faxed to [phone] (med list, MD note, lab results) Pending case: [id number]

## 2020-11-15 ENCOUNTER — Telehealth: Payer: Self-pay

## 2020-11-15 ENCOUNTER — Telehealth: Payer: Self-pay | Admitting: Cardiology

## 2020-11-15 NOTE — Telephone Encounter (Signed)
Pt c/o of Chest Pain: STAT if CP now or developed within 24 hours  1. Are you having CP right now? No as soon as pt took Nitro it went away  2. Are you experiencing any other symptoms (ex. SOB, nausea, vomiting, sweating)? No   3. How long have you been experiencing CP? Started 9am this morning  4. Is your CP continuous or coming and going? Coming and going  5. Have you taken Nitroglycerin? Yes took 1 Nitro, pt also took 3 baby aspirin  Pt was seen by Dr. Carlis Abbott at [facility] 3 weeks ago and was told she had a Blood Clot in her right leg   ?

## 2020-11-15 NOTE — Telephone Encounter (Signed)
Patient called to report pain in the back of her left leg about 5-6 inches above the knee. She was seen on 3/22 and a small clot was seen in the small saphenous. She then reports two episodes of chest pain this morning that immediately went away with ASA and nitro. Advised her to call cardiology. Discussed leg symptoms with provider - she is to continue compression, elevation, and she says that a heating pad is also helping. If symptoms progress or worsen - she is to call back go to ED.

## 2020-11-15 NOTE — Telephone Encounter (Signed)
Reviewed with Dr. Johney Frame. She asked me to reassure pt that what [facility] told her - that her clot was in a superficial vein and that there is very little likelihood that it could travel.  As well, she just had a nuc stress test that was very reassuring.  Noted pt is not on an aspirin regularly.  The patient states that the aspirin causes her stomach to bleed so she takes it as infrequently as possible.     She wonders if she should restart Brilinta.  I adv her do not restart this medication unless instructed by her physician.  She said [facility] has not asked her to do so.    The pt is pleased with this information and will call back if episodes continue.  Per Dr. Johney Frame she may start patient on low dose IMDUR if episodes continue.

## 2020-11-15 NOTE — Telephone Encounter (Signed)
Call transferred into triage, per operator for pt having current chest pain.  Spoke with the patient. She was last seen by Dr.Nelson 09/2020, new to Dr.Pemberton. History of CAD, stent in 2019, ischemic cardiomyopathy.  This morning at rest felt left sided chest pressure that "stops you and took my breath".  "Intense pressure".  This lasted approx 30 seconds and it happened twice in about two minutes.  Even though the pain subsided, she took 4 aspirins and a ntg because she was dx with a "small blood clot in my leg 3 weeks ago by Dr. Clark" and was worried it had travelled to her heart. She is using heat and compression for the leg.  No blood thinners. While reviewing chart pt received a return call from Dr. Clark's office and took this.   ' Called patient back.  She is now at her PCP office, went there to pick up a stool sample kit and has told the nurse about the chest pain.    She is aware I will discuss with Dr. Pemberton and if there are any new recommendations we will call her back.  

## 2020-11-28 ENCOUNTER — Encounter: Payer: Self-pay | Admitting: Gastroenterology

## 2020-11-28 ENCOUNTER — Ambulatory Visit (INDEPENDENT_AMBULATORY_CARE_PROVIDER_SITE_OTHER): Payer: Medicare Other | Admitting: Gastroenterology

## 2020-11-28 VITALS — BP 126/74 | HR 84 | Wt 197.2 lb

## 2020-11-28 DIAGNOSIS — Z86010 Personal history of colonic polyps: Secondary | ICD-10-CM

## 2020-11-28 DIAGNOSIS — R194 Change in bowel habit: Secondary | ICD-10-CM | POA: Insufficient documentation

## 2020-11-28 DIAGNOSIS — K219 Gastro-esophageal reflux disease without esophagitis: Secondary | ICD-10-CM

## 2020-11-28 DIAGNOSIS — R1013 Epigastric pain: Secondary | ICD-10-CM

## 2020-11-28 DIAGNOSIS — K2270 Barrett's esophagus without dysplasia: Secondary | ICD-10-CM

## 2020-11-28 MED ORDER — SUTAB 1479-225-188 MG PO TABS: 1.0000 | ORAL_TABLET | Freq: Once | ORAL | 0 refills | Status: AC

## 2020-11-28 MED ORDER — SUCRALFATE 1 G PO TABS: 1.0000 g | ORAL_TABLET | Freq: Three times a day (TID) | ORAL | 1 refills | Status: DC

## 2020-11-28 MED ORDER — ONDANSETRON HCL 4 MG PO TABS: 4.0000 mg | ORAL_TABLET | Freq: Three times a day (TID) | ORAL | 1 refills | Status: DC | PRN

## 2020-11-28 MED ORDER — ESOMEPRAZOLE MAGNESIUM 40 MG PO CPDR: 40.0000 mg | DELAYED_RELEASE_CAPSULE | Freq: Two times a day (BID) | ORAL | 2 refills | Status: DC

## 2020-11-28 NOTE — Patient Instructions (Addendum)
If you are age 74 or older, your body mass index should be between 23-30. Your Body mass index is 31.83 kg/m. If this is out of the aforementioned range listed, please consider follow up with your Primary Care Provider.  If you are age 16 or younger, your body mass index should be between 19-25. Your Body mass index is 31.83 kg/m. If this is out of the aformentioned range listed, please consider follow up with your Primary Care Provider.   We have sent the following medications to your pharmacy for you to pick up at your convenience: Zofran 4 mg, Nexium 40 mg , and Carafate.  Increase Nexium 40 mg to twice daily.   You have been scheduled for an endoscopy and colonoscopy. Please follow the written instructions given to you at your visit today. Please pick up your prep supplies at the pharmacy within the next 1-3 days. If you use inhalers (even only as needed), please bring them with you on the day of your procedure.  Thank you for choosing me and [facility].  Alonza Bogus, PA-C

## 2020-11-28 NOTE — Progress Notes (Signed)
Noted  
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
## 2020-12-28 ENCOUNTER — Telehealth: Payer: Self-pay | Admitting: Cardiology

## 2020-12-28 NOTE — Telephone Encounter (Signed)
New Message:     Pt called and said she had to take a Nitroglycerin this morning, it was not for chest pains.  She had pain in her head, neck, shoulder, calf, foot all on her left side. She said it feels like a cramp pain feeling.eShe wants to know if there anything else she can do? She also  took Amitrpalene and Lidocaine patch.     Said  Her blood pressure dropped after she took the Nitroglycerin.   Pt c/o BP issue: STAT if pt c/o blurred vision, one-sided weakness or slurred speech  1. What are your last 5 BP readings? 137/86 and heart rate 71  2. Are you having any other symptoms (ex. Dizziness, headache, blurred vision, passed out)? Headache and real tired  3. What is your BP issue? Blood pressure dropped after she took the Notroglycerin

## 2020-12-28 NOTE — Telephone Encounter (Signed)
Returned call to Pt.  Per Pt she is calling to report that she took a nitro tab.  She did not take it for chest pain.  She states she had a headache, neck pain, leg and foot pain "feels like a cramp on only my left side".  Pt states she also take amitriptyline (which is not her med list).  She also put lidocaine patches on her legs.  Advised Pt NOT to take nitro for any pain other than chest pain.  Advised to take tylenol for pain.  Pt states she is allergic.  Advised normal for blood pressure to drop after taking nitroglycerin.    No further action needed.

## 2021-01-09 ENCOUNTER — Encounter: Payer: Self-pay | Admitting: Internal Medicine

## 2021-01-14 ENCOUNTER — Telehealth: Payer: Self-pay

## 2021-01-14 NOTE — Telephone Encounter (Signed)
call from pt. Sent upstairs from [place] 3rd floor.  Pt reports she is allergic to polyester.  Pt was told our sheets, chuck and gowns all have a percentage of polyester.  Pt said she would bring her own top sheet, bottom sheet, pillow case and t-shirt.  I advised her I did not know about towels or washcloths.  Pt replied she would bring her own.  Kristen sent a note to [place], [place].  Jess Barters, RN was also here and was advised of pt's LLERGY.  sHE IS WORKNG IN ADMITTING IN THE AM AND WILL FOLLOW UP WITH PT. MAW,rn

## 2021-01-15 ENCOUNTER — Encounter: Payer: Self-pay | Admitting: Internal Medicine

## 2021-01-15 ENCOUNTER — Ambulatory Visit (AMBULATORY_SURGERY_CENTER): Payer: Medicare Other | Admitting: Internal Medicine

## 2021-01-15 ENCOUNTER — Other Ambulatory Visit: Payer: Self-pay

## 2021-01-15 VITALS — BP 138/64 | HR 69 | Temp 97.3°F | Resp 12 | Wt 197.0 lb

## 2021-01-15 DIAGNOSIS — K621 Rectal polyp: Secondary | ICD-10-CM

## 2021-01-15 DIAGNOSIS — K635 Polyp of colon: Secondary | ICD-10-CM

## 2021-01-15 DIAGNOSIS — R197 Diarrhea, unspecified: Secondary | ICD-10-CM | POA: Diagnosis not present

## 2021-01-15 DIAGNOSIS — K2270 Barrett's esophagus without dysplasia: Secondary | ICD-10-CM | POA: Diagnosis not present

## 2021-01-15 DIAGNOSIS — Z86010 Personal history of colonic polyps: Secondary | ICD-10-CM

## 2021-01-15 DIAGNOSIS — R194 Change in bowel habit: Secondary | ICD-10-CM | POA: Diagnosis not present

## 2021-01-15 DIAGNOSIS — R1013 Epigastric pain: Secondary | ICD-10-CM

## 2021-01-15 DIAGNOSIS — D128 Benign neoplasm of rectum: Secondary | ICD-10-CM

## 2021-01-15 DIAGNOSIS — K219 Gastro-esophageal reflux disease without esophagitis: Secondary | ICD-10-CM

## 2021-01-15 DIAGNOSIS — D124 Benign neoplasm of descending colon: Secondary | ICD-10-CM

## 2021-01-15 MED ORDER — SODIUM CHLORIDE 0.9 % IV SOLN: 500.0000 mL | Freq: Once | INTRAVENOUS | Status: DC

## 2021-01-15 NOTE — Progress Notes (Signed)
PT taken to PACU. Monitors in place. VSS. Report given to RN. 

## 2021-01-15 NOTE — Op Note (Signed)
[facility] Patient Name: Donna Bailey Procedure Date: 01/15/2021 7:25 AM MRN: [id number] Endoscopist: Wilhemina Bonito. Marina Goodell , MD Age: 75 Referring MD:  Date of Birth: [birth year] Gender: Female Account #: [id number] Procedure:                Upper GI endoscopy with biopsies Indications:              Epigastric abdominal pain, Esophageal reflux,                            Barrett's esophagus Medicines:                Monitored Anesthesia Care Procedure:                Pre-Anesthesia Assessment:                           - Prior to the procedure, a History and Physical                            was performed, and patient medications and                            allergies were reviewed. The patient's tolerance of                            previous anesthesia was also reviewed. The risks                            and benefits of the procedure and the sedation                            options and risks were discussed with the patient.                            All questions were answered, and informed consent                            was obtained. Prior Anticoagulants: The patient has                            taken no previous anticoagulant or antiplatelet                            agents. ASA Grade Assessment: II - A patient with                            mild systemic disease. After reviewing the risks                            and benefits, the patient was deemed in                            satisfactory condition to undergo the procedure.  After obtaining informed consent, the endoscope was                            passed under direct vision. Throughout the                            procedure, the patient's blood pressure, pulse, and                            oxygen saturations were monitored continuously. The                            Endoscope was introduced through the mouth, and                            advanced to the second  part of duodenum. The upper                            GI endoscopy was accomplished without difficulty.                            The patient tolerated the procedure well. Scope In: Scope Out: Findings:                 The esophagus revealed a 1.5 cm segment of distal                            Barrett's esophagus type mucosa. There was some                            inflammation. No nodularity. Biopsies were taken                            with a cold forceps for histology.                           The stomach revealed a moderate sized sliding                            hiatal hernia. In addition multiple benign fundic                            gland type polyps.                           The examined duodenum was normal.                           The cardia and gastric fundus were otherwise normal                            on retroflexion. Complications:            No immediate complications. Estimated Blood Loss:     Estimated blood loss: none. Impression:  1. Short segment Barrett's esophagus status post                            biopsies                           2. Mild esophagitis                           3. Benign fundic gland polyps                           4. Moderate hiatal hernia                           5. Otherwise normal EGD. Recommendation:           - Patient has a contact number available for                            emergencies. The signs and symptoms of potential                            delayed complications were discussed with the                            patient. Return to normal activities tomorrow.                            Written discharge instructions were provided to the                            patient.                           - Resume previous diet.                           - Continue present medications.                           - Await pathology results.                           - Repeat EGD in 5 years if no  dysplasia on biopsies Amol Domanski N. Marina Goodell, MD 01/15/2021 8:55:00 AM This report has been signed electronically.

## 2021-01-15 NOTE — Op Note (Signed)
[place] Patient Name: Donna Bailey Procedure Date: 01/15/2021 7:24 AM MRN: [id number] Endoscopist: Docia Chuck. Henrene Pastor , MD Age: 75 Referring MD:  Date of Birth: July 21, 1946 Gender: Female Account #: [id number] Procedure:                Colonoscopy with cold snare polypectomy x 2; with                            biopsies Indications:              High risk colon cancer surveillance: Personal                            history of multiple (3 or more) adenomas. Multiple                            prior exams. Most recent examinations 2011, 2016.                            Complains of alternating bowel habits with diarrhea Medicines:                Monitored Anesthesia Care Procedure:                Pre-Anesthesia Assessment:                           - Prior to the procedure, a History and Physical                            was performed, and patient medications and                            allergies were reviewed. The patient's tolerance of                            previous anesthesia was also reviewed. The risks                            and benefits of the procedure and the sedation                            options and risks were discussed with the patient.                            All questions were answered, and informed consent                            was obtained. Prior Anticoagulants: The patient has                            taken no previous anticoagulant or antiplatelet                            agents. ASA Grade Assessment: II - A patient with  mild systemic disease. After reviewing the risks                            and benefits, the patient was deemed in                            satisfactory condition to undergo the procedure.                           After obtaining informed consent, the colonoscope                            was passed under direct vision. Throughout the                            procedure, the  patient's blood pressure, pulse, and                            oxygen saturations were monitored continuously. The                            Olympus CF-HQ190 [id number]) Colonoscope was                            introduced through the anus and advanced to the the                            cecum, identified by appendiceal orifice and                            ileocecal valve. The ileocecal valve, appendiceal                            orifice, and rectum were photographed. The quality                            of the bowel preparation was excellent. The                            colonoscopy was performed without difficulty. The                            patient tolerated the procedure well. The bowel                            preparation used was SUPREP via split dose                            instruction. Scope In: 8:10:53 AM Scope Out: 8:27:35 AM Scope Withdrawal Time: 0 hours 14 minutes 25 seconds  Total Procedure Duration: 0 hours 16 minutes 42 seconds  Findings:                 Two polyps were found in the rectum and descending  colon. The polyps were 2 to 3 mm in size. These                            polyps were removed with a cold snare. Resection                            and retrieval were complete.                           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 The                            entire examined colon appeared otherwise normal on                            direct and retroflexion views. Biopsies for                            histology were taken with a cold forceps from the                            entire colon for evaluation of microscopic colitis. Complications:            No immediate complications. Estimated blood loss:                            None. Estimated Blood Loss:     Estimated blood loss: none. Impression:               - Two 2 to 3 mm polyps in the rectum and in the                            descending  colon, removed with a cold snare.                            Resected and retrieved.                           - Cecal diverticulum                           - The entire examined colon is otherwise normal on                            direct and retroflexion views. Recommendation:           - Repeat colonoscopy in 5 years for surveillance.                           - Patient has a contact number available for                            emergencies. The signs and symptoms of potential  delayed complications were discussed with the                            patient. Return to normal activities tomorrow.                            Written discharge instructions were provided to the                            patient.                           - Resume previous diet.                           - Continue present medications.                           - Await pathology results. Docia Chuck. Henrene Pastor, MD 01/15/2021 8:50:28 AM This report has been signed electronically.

## 2021-01-15 NOTE — Progress Notes (Signed)
Called to room to assist during endoscopic procedure.  Patient ID and intended procedure confirmed with present staff. Received instructions for my participation in the procedure from the performing physician.  

## 2021-01-15 NOTE — Patient Instructions (Signed)
Handouts given for polyps, Hiatal hernia and esophagitis.   YOU HAD AN ENDOSCOPIC PROCEDURE TODAY AT [facility]:   Refer to the procedure report that was given to you for any specific questions about what was found during the examination.  If the procedure report does not answer your questions, please call your gastroenterologist to clarify.  If you requested that your care partner not be given the details of your procedure findings, then the procedure report has been included in a sealed envelope for you to review at your convenience later.  YOU SHOULD EXPECT: Some feelings of bloating in the abdomen. Passage of more gas than usual.  Walking can help get rid of the air that was put into your GI tract during the procedure and reduce the bloating. If you had a lower endoscopy (such as a colonoscopy or flexible sigmoidoscopy) you may notice spotting of blood in your stool or on the toilet paper. If you underwent a bowel prep for your procedure, you may not have a normal bowel movement for a few days.  Please Note:  You might notice some irritation and congestion in your nose or some drainage.  This is from the oxygen used during your procedure.  There is no need for concern and it should clear up in a day or so.  SYMPTOMS TO REPORT IMMEDIATELY:  Following lower endoscopy (colonoscopy or flexible sigmoidoscopy):  Excessive amounts of blood in the stool  Significant tenderness or worsening of abdominal pains  Swelling of the abdomen that is new, acute  Fever of 100F or higher  Following upper endoscopy (EGD)  Vomiting of blood or coffee ground material  New chest pain or pain under the shoulder blades  Painful or persistently difficult swallowing  New shortness of breath  Fever of 100F or higher  Black, tarry-looking stools  For urgent or emergent issues, a gastroenterologist can be reached at any hour by calling [phone]. Do not use MyChart messaging for urgent  concerns.    DIET:  We do recommend a small meal at first, but then you may proceed to your regular diet.  Drink plenty of fluids but you should avoid alcoholic beverages for 24 hours.  ACTIVITY:  You should plan to take it easy for the rest of today and you should NOT DRIVE or use heavy machinery until tomorrow (because of the sedation medicines used during the test).    FOLLOW UP: Our staff will call the number listed on your records 48-72 hours following your procedure to check on you and address any questions or concerns that you may have regarding the information given to you following your procedure. If we do not reach you, we will leave a message.  We will attempt to reach you two times.  During this call, we will ask if you have developed any symptoms of COVID 19. If you develop any symptoms (ie: fever, flu-like symptoms, shortness of breath, cough etc.) before then, please call [phone].  If you test positive for Covid 19 in the 2 weeks post procedure, please call and report this information to [place].    If any biopsies were taken you will be contacted by phone or by letter within the next 1-3 weeks.  Please call us at [phone] if you have not heard about the biopsies in 3 weeks.    SIGNATURES/CONFIDENTIALITY: You and/or your care partner have signed paperwork which will be entered into your electronic medical record.  These signatures attest to  the fact that that the information above on your After Visit Summary has been reviewed and is understood.  Full responsibility of the confidentiality of this discharge information lies with you and/or your care-partner.

## 2021-01-17 ENCOUNTER — Telehealth: Payer: Self-pay

## 2021-01-17 NOTE — Telephone Encounter (Signed)
LVM

## 2021-01-17 NOTE — Telephone Encounter (Signed)
  Follow up Call-  Call back number 01/15/2021  Post procedure Call Back phone  # [phone]  Permission to leave phone message Yes  Some recent data might be hidden     1st follow up call made. NALM

## 2021-01-23 ENCOUNTER — Encounter: Payer: Self-pay | Admitting: Internal Medicine

## 2021-02-06 ENCOUNTER — Telehealth: Payer: Self-pay | Admitting: Internal Medicine

## 2021-02-06 NOTE — Telephone Encounter (Signed)
NMssage:     Pt have an appt with Dr Debara Pickett tomorrow for [facility]. She said she have not had her Lipid checked since April at her primary doctor, who is sending the results. Can she use that lab work or will she need to reschedule that appt for tomorrow?

## 2021-02-06 NOTE — Telephone Encounter (Signed)
LVM for pt letting her know she can come for her appointment tomorrow. If she needs follow up lab work, they may draw it at that time.

## 2021-02-07 ENCOUNTER — Encounter: Payer: Self-pay | Admitting: Internal Medicine

## 2021-02-07 ENCOUNTER — Ambulatory Visit (INDEPENDENT_AMBULATORY_CARE_PROVIDER_SITE_OTHER): Payer: Medicare Other | Admitting: Internal Medicine

## 2021-02-07 ENCOUNTER — Other Ambulatory Visit: Payer: Self-pay

## 2021-02-07 VITALS — BP 124/72 | HR 71 | Ht 66.0 in | Wt 194.8 lb

## 2021-02-07 DIAGNOSIS — T466X5A Adverse effect of antihyperlipidemic and antiarteriosclerotic drugs, initial encounter: Secondary | ICD-10-CM | POA: Diagnosis not present

## 2021-02-07 DIAGNOSIS — M791 Myalgia, unspecified site: Secondary | ICD-10-CM

## 2021-02-07 DIAGNOSIS — I2510 Atherosclerotic heart disease of native coronary artery without angina pectoris: Secondary | ICD-10-CM

## 2021-02-07 DIAGNOSIS — E7849 Other hyperlipidemia: Secondary | ICD-10-CM
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
## 2021-02-07 NOTE — Patient Instructions (Signed)
Medication Instructions:  We will let you know when Leqvio injections can be scheduled   *If you need a refill on your cardiac medications before your next appointment, please call your pharmacy*   Lab Work: FASTING lab work in 6 months -- complete before your next visit  If you have labs (blood work) drawn today and your tests are completely normal, you will receive your results only by: [place] (if you have MyChart) OR A paper copy in the mail If you have any lab test that is abnormal or we need to change your treatment, we will call you to review the results.   Testing/Procedures: NONE   Follow-Up: At [facility], you and your health needs are our priority.  As part of our continuing mission to provide you with exceptional heart care, we have created designated Provider Care Teams.  These Care Teams include your primary Cardiologist (physician) and Advanced Practice Providers (APPs -  Physician Assistants and Nurse Practitioners) who all work together to provide you with the care you need, when you need it.  We recommend signing up for the patient portal called "MyChart".  Sign up information is provided on this After Visit Summary.  MyChart is used to connect with patients for Virtual Visits (Telemedicine).  Patients are able to view lab/test results, encounter notes, upcoming appointments, etc.  Non-urgent messages can be sent to your provider as well.   To learn more about what you can do with MyChart, go to [email or web address].    Your next appointment:   6 month(s) - lipid clinic  The format for your next appointment:   In Person  Provider:   K. Mali Hilty, MD   Other Instructions

## 2021-02-20 ENCOUNTER — Other Ambulatory Visit: Payer: Self-pay

## 2021-02-20 ENCOUNTER — Encounter: Payer: Self-pay | Admitting: Neurology

## 2021-02-20 ENCOUNTER — Ambulatory Visit (INDEPENDENT_AMBULATORY_CARE_PROVIDER_SITE_OTHER): Payer: Medicare Other | Admitting: Neurology

## 2021-02-20 VITALS — BP 160/75 | HR 68 | Ht 66.0 in | Wt 195.0 lb

## 2021-02-20 DIAGNOSIS — R413 Other amnesia: Secondary | ICD-10-CM

## 2021-02-20 NOTE — Patient Instructions (Signed)
Schedule open MRI brain with and without contrast and MRA head without contrast  2. Schedule Neurocognitive testing  3. Follow-up after tests, call for any changes    RECOMMENDATIONS FOR ALL PATIENTS WITH MEMORY PROBLEMS: 1. Continue to exercise (Recommend 30 minutes of walking everyday, or 3 hours every week) 2. Increase social interactions - continue going to [place] and enjoy social gatherings with friends and family 3. Eat healthy, avoid fried foods and eat more fruits and vegetables 4. Maintain adequate blood pressure, blood sugar, and blood cholesterol level. Reducing the risk of stroke and cardiovascular disease also helps promoting better memory. 5. Avoid stressful situations. Live a simple life and avoid aggravations. Organize your time and prepare for the next day in anticipation. 6. Sleep well, avoid any interruptions of sleep and avoid any distractions in the bedroom that may interfere with adequate sleep quality 7. Avoid sugar, avoid sweets as there is a strong link between excessive sugar intake, diabetes, and cognitive impairment The Mediterranean diet has been shown to help patients reduce the risk of progressive memory disorders and reduces cardiovascular risk. This includes eating fish, eat fruits and green leafy vegetables, nuts like almonds and hazelnuts, walnuts, and also use olive oil. Avoid fast foods and fried foods as much as possible. Avoid sweets and sugar as sugar use has been linked to worsening of memory function.
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
## 2021-02-22 NOTE — Progress Notes (Signed)
No PA is required for pt,

## 2021-03-06 ENCOUNTER — Telehealth: Payer: Self-pay | Admitting: Internal Medicine

## 2021-03-06 MED ORDER — PROMETHAZINE-CODEINE 6.25-10 MG/5ML PO SYRP: 5.0000 mL | ORAL_SOLUTION | Freq: Four times a day (QID) | ORAL | 0 refills | Status: DC | PRN

## 2021-03-06 MED ORDER — AZITHROMYCIN 250 MG PO TABS: ORAL_TABLET | ORAL | 0 refills | Status: DC

## 2021-03-06 NOTE — Addendum Note (Signed)
Addended by: Valerie Salts on: 03/06/2021 10:52 AM   Modules accepted: Orders

## 2021-03-06 NOTE — Telephone Encounter (Signed)
Pt calling and has developed a head cold.Wanting to get a medication to help the cough before it gets into her chest.Pt has used Guaifensin codeine and promethazine codeine not sure which helps best.Please advise [phone] Pharmacy[facility] [address]

## 2021-03-06 NOTE — Telephone Encounter (Signed)
Called and spoke with patient. She stated that she can tolerate a zpak. She verbalized understanding of the instructions.   Nothing further needed at time of call.

## 2021-03-06 NOTE — Telephone Encounter (Signed)
[facility]. It sounds as if she may be developing a sinus infection.  She can try saline nasal rinse, like Neti pot or NeilMed. If she needs an antibiotic, I think she can take doxycycline 100 mg, # 8, 2 today then one daily

## 2021-03-06 NOTE — Telephone Encounter (Signed)
Called and spoke with patient. She verbalized understanding of recommendations. She stated that she would like to go ahead and start the Doxy just in case this is a sinus infection. [facility] [address].   Nothing further needed at time of call.   I attempted to send in doxy but I received a message stating she has an allergy to it. It causes diarrhea, nausea and vomiting. I called her to confirm this and she confirmed it does cause these side effects.   Dr. [name], can you suggest another antibiotic? Thanks!

## 2021-03-06 NOTE — Telephone Encounter (Signed)
Can she tolerate a Zpak 250 mg, # 6, 2 today then one daily? If not, then she should use the nasal rinse and try to get by without an antibiotic

## 2021-03-06 NOTE — Telephone Encounter (Signed)
Called and spoke with patient. She stated that she had developed a head cough a few days ago. She started out with just post nasal drip. Now she has developed facial pain below both of her eyes, both ears full like they are clogged. She also stated that she has a cough but it has been non productive. She denied any fevers.   She found an old bottles of guaifenesin-codeine and promethazine-codeine. Both bottles had about a tablespoon left in them. She used both of them (at separate times) and noticed that they did help her cough.   She is requesting a refill on one of the cough syrups. She is not sure which one helped the best.   [facility] [address].   CY, can you please advise? Thanks!

## 2021-03-11 ENCOUNTER — Telehealth: Payer: Self-pay

## 2021-03-11 NOTE — Telephone Encounter (Signed)
-----   Message from Cameron Sprang, MD sent at 03/11/2021 11:11 AM EDT ----- Regarding: MRI/MRA results Pls let her know the brain MRI looked fine, no evidence of tumor, stroke, or bleed. Blood vessels looked good. Proceed with memory testing as scheduled. Thanks  ----- Message ----- From: Virl Son Sent: 03/11/2021  10:50 AM EDT To: Cameron Sprang, MD

## 2021-03-11 NOTE — Telephone Encounter (Signed)
Patient advised of MRI results, voiced understanding of results.

## 2021-04-17 ENCOUNTER — Other Ambulatory Visit: Payer: Self-pay

## 2021-04-17 ENCOUNTER — Encounter: Payer: Self-pay | Admitting: Counselor

## 2021-04-17 ENCOUNTER — Ambulatory Visit (INDEPENDENT_AMBULATORY_CARE_PROVIDER_SITE_OTHER): Payer: Medicare Other | Admitting: Counselor

## 2021-04-17 ENCOUNTER — Ambulatory Visit: Payer: Medicare Other | Admitting: Psychology

## 2021-04-17 DIAGNOSIS — G3184 Mild cognitive impairment, so stated: Secondary | ICD-10-CM | POA: Diagnosis not present

## 2021-04-17 DIAGNOSIS — R413 Other amnesia: Secondary | ICD-10-CM

## 2021-04-17 DIAGNOSIS — F09 Unspecified mental disorder due to known physiological condition: Secondary | ICD-10-CM
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
## 2021-04-17 NOTE — Progress Notes (Signed)
   Psychometrist Note   Cognitive testing was administered to Donna Bailey by Milana Kidney, B.S. (Technician) under the supervision of Alphonzo Severance, Psy.D., ABN. Donna Bailey was able to tolerate all test procedures. Dr. Nicole Kindred met with the patient as needed to manage any emotional reactions to the testing procedures. Rest breaks were offered.    The battery of tests administered was selected by Dr. Nicole Kindred with consideration to the patient's current level of functioning, the nature of her symptoms, emotional and behavioral responses during the interview, level of literacy, observed level of motivation/effort, and the nature of the referral question. This battery was communicated to the psychometrist. Communication between Dr. Nicole Kindred and the psychometrist was ongoing throughout the evaluation and Dr. Nicole Kindred was immediately accessible at all times. Dr. Nicole Kindred provided supervision to the technician on the date of this service, to the extent necessary to assure the quality of all services provided.    Donna Bailey will return in approximately one week for an interactive feedback session with Dr. Nicole Kindred, at which time test performance, clinical impressions, and treatment recommendations will be reviewed in detail. The patient understands she can contact our office should she require our assistance before this time.   A total of 95 minutes of billable time were spent with Donna Bailey by the technician, including test administration and scoring time. Billing for these services is reflected in Dr. Les Pou note.   This note reflects time spent with the psychometrician and does not include test scores, clinical history, or any interpretations made by Dr. Nicole Kindred. The full report will follow in a separate note.

## 2021-04-18 NOTE — Progress Notes (Signed)
NEUROPSYCHOLOGICAL TEST SCORES [facility]  Patient Name: Donna Bailey MRN: [id number] Date of Birth: 03/22/1946 Age: 75 y.o. Education: 16 years  Measurement properties of test scores: IQ, Index, and Standard Scores (SS): Mean = 100; Standard Deviation = 15 Scaled Scores (Ss): Mean = 10; Standard Deviation = 3 Z scores (Z): Mean = 0; Standard Deviation = 1 T scores (T); Mean = 50; Standard Deviation = 10  TEST SCORES:    Note: This summary of test scores accompanies the interpretive report and should not be interpreted by unqualified individuals or in isolation without reference to the report. Test scores are relative to age, gender, and educational history as available and appropriate.   Performance Validity        "A" Random Letter Test Raw  Descriptor      Errors 0 Within Expectation  The Dot Counting Test: 10 Within Expectation  NAB Effort Index 0 Within Expectation      Mental Status Screening     Total Score Descriptor  MoCA 26 Normal      Expected Functioning        Wide Range Achievement Test: Standard/Scaled Score Percentile      Word Reading 102 55      Reynolds Intellectual Screening Test Standard/T-score Percentile      Guess What 51 54      Odd Item Out 59 82  RIST Index 109 73      Attention/Processing Speed        Neuropsychological Assessment Battery (Attention Module, Form 1): T-score Percentile      Digits Forward 41 18      Digits Backwards 50 50      Repeatable Battery for the Assessment of Neuropsychological Status (Form A): Scaled Score Percentile      Coding 8 25      Language        Neuropsychological Assessment Battery (Language Module, Form 1): T-score Percentile      Naming   (30) 57 75      Verbal Fluency: T-score Percentile      Controlled Oral Word Association (F-A-S) 43 25      Semantic Fluency (Animals) 41 18      Memory:        Neuropsychological Assessment Battery (Memory Module, Form 1): T-score Percentile       List Learning           List A Immediate Recall   (6, 7, 7) 41 18         List B Immediate Recall   (6) 56 73         List A Short Delayed Recall   (5) 36 8         List A Long Delayed Recall   (6) 41 18         List A Percent Retention   (120 %) --- 79         List A Long Delayed Yes/No Recognition Hits   (12) --- 88         List A Long Delayed Yes/No Recognition False Alarms   (0) --- 88         List A Recognition Discriminability Index --- 93      Story Learning           Immediate Recall   (21, 33) 38 12         Delayed Recall   (32) 45 31  Percent Retention   (97 %) --- 58      Daily Living Memory            Immediate Recall   (27, 19) 55 69          Delayed Recall   (9, 7) 60 84          Percent Retention (94 %) --- 73          Recognition Hits    (9) --- 62      Repeatable Battery for the Assessment of Neuropsychological Status (Form A): Scaled Score Percentile         Figure Recall   (16) 13 84      Visuospatial/Constructional Functioning        Repeatable Battery for the Assessment of Neuropsychological Status (Form A): Standard/Scaled Score Percentile     Visuospatial/Constructional Index 126 96         Figure Copy   (20) 14 91         Judgment of Line Orientation   (19) --- >75      Executive Functioning        Modified [place] Card Sorting Test (MWCST): Standard/T-Score Percentile      Number of Categories Correct 55 69      Number of Perseverative Errors 62 88      Number of Total Errors 60 84      Percent Perseverative Errors 62 88  Executive Function Composite 114 82      Trail Making Test: T-Score Percentile      Part A 56 73      Part B 49 46      [place] Diagnostic Aphasia Exam: Raw Score Scaled Score      Complex Ideational Material 9 5      Rating Scales        Clinical Dementia Rating Raw Score Descriptor      Sum of Boxes 0.0 Normal      Global Score 0.0 Normal      Quick Dementia Rating System Raw Score Descriptor      Sum of  Boxes 3 Very Mild Dementia      Total Score 5.5 MCI  Geriatric Depression Scale - Short Form 2 Negative   Alayzia Pavlock V. Roseanne Reno PsyD, ABN Clinical Neuropsychologist

## 2021-04-22 NOTE — Progress Notes (Signed)
[facility]  Patient Name: Donna Bailey MRN: [id number] Date of Birth: Jun 10, 1946 Age: 75 y.o. 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 She reported fairly minimal symptoms, mainly word finding problems and forgetting the reason for which she has entered a room. She also reports moments of "blanking" out although my impression is that she is attempting to describe momentary lapses in memory retrieval and/or task performance as opposed to any actual alteration of consciousness. She feels that her difficulties started in 2019 and then worsened after she hit her head.   Neuropsychological testing is within broad limits of normal in all areas. While there were a couple of marginal immediate recall scores, the number of low memory subtest scores is not unusual and she retained information very well over time. She also had very good performance on certain complex measures of executive function, visuospatial function, and she performed errorlessly on visual object confrontation naming (a measure viewed as particularly specific for Alzheimer's). Her husband rates her as functioning almost at a mild dementia level, although interestingly enough, much of that score is related to his perception that she is not doing much at all around the house, which may be related to physical and/or motivational (as opposed to cognitive) factors. She screened negative for the presence of depression.   Donna Bailey is demonstrating normal cognitive performance. The findings are not concerning for objective cognitive impairment and she will be reassured. She may also benefit from avoiding overfocus on cognition, maintaining realistic expectations, and the  like. Would also encourage exercise and healthy lifestyle behaviors as tolerated to improve stamina, under the supervision of her medical treatment providers/PCP.   Diagnostic Impressions: Cognitive complaints with normal neuropsychological exam  Recommendations to be discussed with patient  Your performance and presentation on assessment today were consistent with normal cognitive performance. That is, you performed within the expected ranges on tasks of various abilities including measures of memory and language that are particularly sensitive/specific for Alzheimer's disease. Your cognitive problems are thus subjective, because you do not appear to have measurable deficits in any area.  Many of the symptoms that you reported on interview are things to happen with a high frequency in the normal healthy population. Things like forgetting why you walked into a room and occasional word finding problems are common and unfortunately, these things become increasingly common as you age. My best advice would be to not over focus on cognitive performance, be patient with yourself, and engage in healthy lifestyle behaviors including eating a healthy diet, getting regular exercise as deemed safe by your medical care providers, and making sure you are getting plenty of social interaction and mental stimulation.  Avoid overfocusing on cognitive performance. Memory and cognition are notoriously fallible and if you are looking for cognitive problems, you are bound to find them. Once someone gets worried about their memory and thinking, they may overfocus on how they are doing day-to-day, and then when normal day-to-day cognitive errors are made, this becomes a cause for more concern. This concern and anxiety then decreases focus from the task at hand, reducing concentration, causing more cognitive problems, and creating a vicious cycle. Rather than critiquing your performance, I would encourage you to remain present  minded and focus on the task at hand. Perhaps most  importantly, have reasonable expectations for yourself. Healthy people forget things, lose focus, and do not perform 100% correctly all the time. Some cognitive errors are normal and are not necessarily a sign that there is something wrong with your brain.   Difficulties of the sort that you describe are often responsive to mindfulness. Most fundamentally, mindfulness involves taking a non-judgmental stance to the unfolding of the present moment and simply noticing your experience. When you are engaged in a task, this includes dedicating yourself to the task fully, without judgment, and simply redirecting yourself back to the task if you find your mind wandering. Various mindfulness practices including mindfulness based stress reduction, mindfulness meditation, and the like have been shown to create subjective and objective improvements in cognitive function. You might consider picking up a copy of "Falling Awake: How to Practice Mindfulness in Everyday Life," by Lorelee Cover, Ph.D., who is one of the best known and most respected authors on the subject.  Test Findings  Test scores are summarized in additional documentation associated with this encounter. Test scores are relative to age, gender, and educational history as available and appropriate. There were no concerns about performance validity as all findings fell within normal expectations.   General Intellectual Functioning/Achievement:  Performance on single word reading was average. Performance on the RIST index was toward the upper most extreme of the average range, with average performance on the verbally mediated subtest and high average performance on the more visually oriented subtest. These findings suggest average to high average as an appropriate standard of comparison for the patient's cognitive test data.  Attention and Processing Efficiency: Performance on indicators of attention was  reasonable with a low average digit repetition forward and average digit repetition backward. Timed number-symbol coding was average.  Language: Visual object confrontation naming was nearly errorless. Generation of words in response to phonemic and category prompts was low average.  Visuospatial Function: Performance on visuospatial/constructional indicators was very good, with an overall score in the superior range. Figure copy was errorless where his judgment of angular line orientations was high average (near errorless).  Learning and Memory: Measures of learning and memory showed reasonable performance and adequate acquisition and retention of information over time. There was some minimal indication of susceptibility to retroactive interference and somewhat weak memory encoding that may be picking up on some of the patient's day-to-day complaints, but the number of low subtest scores is not unusual.  In the verbal realm, Donna Bailey learned 6, 7, and 7 words of a 12 item word list across 3 learning trials, which is low average. Her memory for a distractor alternate list of words was average of 6 words. Her short delayed recall for the target word list was unusually low, suggesting some susceptibility to retroactive interference, but her long delayed recall was low average was 6 words. Recognition discriminability for the words from the list versus foils was errorless, which is very impressive. Memory for short story was low average on immediate recall with very good repetition and average delayed recall. Memory for brief daily-living type information was average on immediate recall and high average on long delayed recall. Recognition discrimination for the information contained amongst false choices was average.  In the visual realm, delayed recall for modestly complex figure was high average.  Executive Functions: Performance on executive indicators showed mainly good scores, with a high average  score on the executive function composition of the modified [place] card sorting test. She achieved an average categories score  and high average (almost superior) perseverative errors score.  Alternating sequencing of numbers and letters of the alphabet was average. Generation of words in response to the letters F-A-S was low average. She did have an isolated low score when reasoning with verbal information on the complex ideational material, which was unusually low, a finding of unclear significance in the midst of otherwise normal test data.  Rating Scale(s): Donna Bailey screened negative for the presence of depression on self rating. Her husband characterized her as functioning at nearly a dementia level, which is interesting given the report of only minimal problems on interview. It seems that a large part of this is related to reduced function at home and with hobbies, which may reflect motivational and or physical factors as opposed to functional impairment on the basis of cognitive decline.  Viviano Simas Nicole Kindred, PsyD, ABN Clinical Neuropsychologist  Coding and Compliance  Billing below reflects technician time, my direct face-to-face time with the patient, time spent in test administration, and time spent in professional activities including but not limited to: neuropsychological test interpretation, integration of neuropsychological test data with clinical history, report preparation, treatment planning, care coordination, and review of diagnostically pertinent medical history or studies.   Services associated with this encounter: Clinical Interview [phone]) plus 155 minutes ([id number]; Neuropsychological Evaluation by Professional)  95 minutes ([id number]; Neuropsychological Testing by Technician)

## 2021-04-23 ENCOUNTER — Encounter: Payer: Self-pay | Admitting: Counselor

## 2021-04-23 ENCOUNTER — Other Ambulatory Visit: Payer: Self-pay

## 2021-04-23 ENCOUNTER — Ambulatory Visit (INDEPENDENT_AMBULATORY_CARE_PROVIDER_SITE_OTHER): Payer: Medicare Other | Admitting: Counselor

## 2021-04-23 DIAGNOSIS — R419 Unspecified symptoms and signs involving cognitive functions and awareness: Secondary | ICD-10-CM | POA: Diagnosis not present

## 2021-04-23 NOTE — Progress Notes (Signed)
[facility]  Feedback Note: I met with Donna Bailey to review the findings resulting from her neuropsychological evaluation. Since the last appointment, she has been about the same. Time was spent reviewing the impressions and recommendations that are detailed in the evaluation report. We discussed impression of normal cognitive performance, as reflected in the patient instructions. I provided psychoeducation regarding mechanisms of injury in heart attack and head injury that can result in cognitive impairment and reassured Donna Bailey that it does not look like she needs to worry about those things. I took time to explain the findings and answer all the patient's questions. I encouraged Donna Bailey to contact me should she have any further questions or if further follow up is desired.   Current Medications and Medical History   Current Outpatient Medications  Medication Sig Dispense Refill   amLODipine (NORVASC) 2.5 MG tablet Take 2.5 mg by mouth daily. (Patient not taking: Reported on 02/20/2021)     Ascorbic Acid (VITAMIN C) 100 MG tablet Take 100 mg by mouth daily. (Patient not taking: Reported on 02/20/2021)     azithromycin (ZITHROMAX) 250 MG tablet Take 2 tablets on first day, then 1 tablet daily until finished. 6 tablet 0   betamethasone dipropionate (DIPROLENE) 0.05 % ointment Apply topically.     Cyanocobalamin (VITAMIN B-12 IJ) Inject 1,000 mg as directed every 30 (thirty) days.     diphenhydrAMINE (BENADRYL) 25 mg capsule Take 25 mg by mouth every 6 (six) hours as needed for itching or allergies.      esomeprazole (NEXIUM) 40 MG capsule Take 1 capsule (40 mg total) by mouth 2 (two) times daily. 60 capsule 2   furosemide (LASIX) 20 MG tablet Take 1 tablet (20 mg total) by mouth daily as needed for fluid. 90 tablet 3   Gabapentin 10 % CREA Apply 1-2 application topically at bedtime.     guaiFENesin-codeine 100-10 MG/5ML syrup TAKE 1 TEASPOONFUL  3 TIMES DAILY AS NEEDED FOR COUGH 180 mL 0   hydrALAZINE (APRESOLINE) 25 MG tablet Take 1 tablet (25 mg total) by mouth daily as needed (for BP greater than 789 systolic). 30 tablet 3   hydrOXYzine (VISTARIL) 25 MG capsule Take 50 mg by mouth every 8 (eight) hours as needed for anxiety.      inclisiran (LEQVIO) 284 MG/1.5ML SOSY injection 284 mg. Administered at day one of treatment, then at 3 months and then every 6 months thereafter. (Patient not taking: Reported on 02/20/2021)     lidocaine (LIDODERM) 5 % SMARTSIG:0-3 Patch(s) Topical As Directed     nitroGLYCERIN (NITROSTAT) 0.4 MG SL tablet Place 1 tablet (0.4 mg total) under the tongue every 5 (five) minutes as needed for chest pain. 25 tablet 2   nystatin (MYCOSTATIN) 100000 UNIT/ML suspension SMARTSIG:4 Milliliter(s) By Mouth 4 Times Daily     omega-3 acid ethyl esters (LOVAZA) 1 g capsule Take 1 capsule by mouth at bedtime. (Patient not taking: Reported on 02/20/2021)     Omega-3 Fatty Acids (FISH OIL) 1000 MG CAPS Take 1 capsule (1,000 mg total) by mouth daily. (Patient not taking: Reported on 02/20/2021) 90 capsule 1   ondansetron (ZOFRAN) 4 MG tablet Take 1 tablet (4 mg total) by mouth every 8 (eight) hours as needed for nausea or vomiting. 60 tablet 1   promethazine-codeine (PHENERGAN WITH CODEINE) 6.25-10 MG/5ML syrup Take 5 mLs by mouth every 6 (six) hours as needed for cough. 200 mL 0   Red Yeast Rice  600 MG TABS Take 1 tablet (600 mg total) by mouth daily. (Patient not taking: Reported on 02/20/2021) 90 tablet 1   sucralfate (CARAFATE) 1 g tablet Take 1 tablet (1 g total) by mouth 4 (four) times daily -  with meals and at bedtime. (Patient not taking: Reported on 02/20/2021) 120 tablet 1   triamcinolone ointment (KENALOG) 0.1 % Apply topically.     TYROSINE PO Take by mouth. (Patient not taking: Reported on 02/20/2021)     zolpidem (AMBIEN) 10 MG tablet Take 1 tablet (10 mg total) by mouth at bedtime as needed for sleep. 30 tablet 5   No  current facility-administered medications for this visit.    Patient Active Problem List   Diagnosis Date Noted   Abdominal pain, epigastric 11/28/2020   Altered bowel habits 11/28/2020   Chronic venous insufficiency 10/23/2020   Dry skin 07/07/2019   Allergic reaction 06/09/2019   Chronic rhinitis 06/09/2019   Adverse food reaction 06/09/2019   Multiple drug allergies 06/09/2019   CAD (coronary artery disease) 04/26/2019   Pure hypercholesterolemia    Right arm pain 04/25/2019   Unstable angina ([place]) 04/25/2019   Possible ACS (acute coronary syndrome) ([place]) 11/25/2018   Close exposure to COVID-19 virus 11/05/2018   Bronchitis, acute 04/15/2018   Upper respiratory infection, acute 04/15/2018   Coronary artery disease with stable angina pectoris ([place]) 01/22/2018   Hematoma of arm, right, sequela 01/05/2018   Ischemic cardiomyopathy 01/05/2018   Migraine 11/13/2017   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 11/13/2017   Myocardial infarct ([place]) 11/13/2017   Cerebral aneurysm 10/31/2015   Obesity (BMI 30-39.9)    Hyperlipidemia LDL goal <70 08/26/2011   Tinea 08/26/2011   Food intolerance 03/25/2011   Personal history of colonic polyps 05/15/2010   Asthma with bronchitis 03/28/2010   Seasonal and perennial allergic rhinitis 11/29/2007   TMJ SYNDROME 11/29/2007   GERD 11/29/2007   HIATAL HERNIA 11/29/2007   DEGENERATIVE JOINT DISEASE 11/29/2007   Fibromyalgia 11/29/2007   SCOLIOSIS 11/29/2007   Insomnia 11/29/2007   Barrett esophagus     Mental Status and Behavioral Observations  Donna Bailey presented on time to the present encounter and was alert and generally oriented. Speech was normal in rate, rhythm, volume, and prosody. Self-reported mood was "good" and affect was euthymic. Thought process was logical and goal oriented and thought content was appropriate to the topics discussed. There were no safety concerns identified at today's encounter, such as thoughts  of harming self or others.   Plan  Feedback provided regarding the patient's neuropsychological evaluation. She was reassured that her cognitive test data is normal, discussed the importance of caring for general health status via healthy lifestyle behaviors and regular medical appointments to optimize cognitive performance. She continued to ask about supplements and alternative treatments that may enhance memory, so I am not sure that she found the findings particularly reassuring. I encouraged her to avoid things such as Prevagen or Neuriva, which are of unproven clinical benefit at best.  [name] was encouraged to contact me if any questions arise or if further follow up is desired.   Viviano Simas Nicole Kindred, PsyD, ABN Clinical Neuropsychologist  Service(s) Provided at This Encounter: 35 minutes [phone]; Conjoint therapy with patient present)

## 2021-04-23 NOTE — Patient Instructions (Signed)
Your performance and presentation on assessment today were consistent with normal cognitive performance. That is, you performed within the expected ranges on tasks of various abilities including measures of memory and language that are particularly sensitive/specific for Alzheimer's disease. Your cognitive problems are thus subjective, because you do not appear to have measurable deficits in any area.  Many of the symptoms that you reported on interview are things to happen with a high frequency in the normal healthy population. Things like forgetting why you walked into a room and occasional word finding problems are common and unfortunately, these things become increasingly common as you age. My best advice would be to not over focus on cognitive performance, be patient with yourself, and engage in healthy lifestyle behaviors including eating a healthy diet, getting regular exercise as deemed safe by your medical care providers, and making sure you are getting plenty of social interaction and mental stimulation.  Avoid overfocusing on cognitive performance. Memory and cognition are notoriously fallible and if you are looking for cognitive problems, you are bound to find them. Once someone gets worried about their memory and thinking, they may overfocus on how they are doing day-to-day, and then when normal day-to-day cognitive errors are made, this becomes a cause for more concern. This concern and anxiety then decreases focus from the task at hand, reducing concentration, causing more cognitive problems, and creating a vicious cycle. Rather than critiquing your performance, I would encourage you to remain present minded and focus on the task at hand. Perhaps most importantly, have reasonable expectations for yourself. Healthy people forget things, lose focus, and do not perform 100% correctly all the time. Some cognitive errors are normal and are not necessarily a sign that there is something wrong with your  brain.    Difficulties of the sort that you describe are often responsive to mindfulness. Most fundamentally, mindfulness involves taking a non-judgmental stance to the unfolding of the present moment and simply noticing your experience. When you are engaged in a task, this includes dedicating yourself to the task fully, without judgment, and simply redirecting yourself back to the task if you find your mind wandering. Various mindfulness practices including mindfulness based stress reduction, mindfulness meditation, and the like have been shown to create subjective and objective improvements in cognitive function. You might consider picking up a copy of "Falling Awake: How to Practice Mindfulness in Everyday Life," by Lorelee Cover, Ph.D., who is one of the best known and most respected authors on the subject.

## 2021-04-26 ENCOUNTER — Encounter: Payer: Medicare Other | Admitting: Counselor

## 2021-04-29 NOTE — Telephone Encounter (Signed)
Left message to call back to discuss Leqvio, per patient request

## 2021-05-01 NOTE — Progress Notes (Signed)
HPI female never smoker followed for bronchitis, allergic rhinitis, chronic insomnia, "intolerance of all corn products", complicated by GERD and multiple medical problems, CAD/MI/ stent Seroquel made her too dizzy. Belsomra 15 mg made her muscles stiff and left her groggy but did help her sleep. Rozerem did not help with sleep but made her dizzy She again requests prescription for zolpidem 10 mg, insisting that nothing else has worked as well to help her manage chronic insomnia. She limits foods with magnesium which she says make her feel hot She tells me she is allergic to nickel, gold and polyester "all of which conduct electricity". Therefore she wants a letter for her to present to [place] asking them to change her electric meter to a "noncommunicating electric meter", so that [employer] will stop sending radio waves through her home. She prefers a manual meter check.  ------------------------------------------------------------------------------------------   05/02/20- 74 yoF known to me with hx of allergic Rhinitis, chronic Bronchitis, CAD/ MI/ stent/CM,  and intolerance to corn products. Obesity, Hyperlipidemia, Migraine, Hiatal Hernia, GERD/ Barretts,  Covid vax- 2 Phizer Flu vax-- today senior ED visit last week for laceration after falling over furniture. Asks refill cough syrup, emphasizing she uses it very sparingly. No acute respsiratory issues, little cough currently but sensitive to weather changes. Sensitive to smoke, burning leaves- eyes water, skin gets red. She has a long history of unusual reactions/ sensitivity, or unusual interpretation of response to environmental triggers.  No recent cardiac events  CXR 1V 04/25/2019-  FINDINGS: The heart size and mediastinal contours are within normal limits. Both lungs are clear. The visualized skeletal structures are unremarkable. IMPRESSION: No active disease.  05/02/21- 70 yoF followed for hx of allergic Rhinitis, chronic  Bronchitis,  complicated by CAD/ MI/ stent/CM,  and intolerance to corn products. Obesity, Hyperlipidemia, Migraine, Hiatal Hernia, GERD/ Barretts,  Covid vax- 3 Phizer    We discussed updated booster. Flu vax- today senior Notes occasional, intermittent mild cough with scant watery phlegm and asks if this is "cardiac cough". Not positional. Asks to keep codeine cough syrup on hand- also uses a little at times to help sleep- dicussed.  No longer takes [place].  Feels tired, snores, wakes with sore, dry mouth. We discussed possible OSA and she agrees to sleep study. She may want oral appliance if appropriate. Denies recent reflux events. Pending cardiology f/u.  ROS-see HPI   + = positive Constitutional:    weight loss, night sweats, fevers, chills, +fatigue, lassitude. HEENT:    headaches, difficulty swallowing, tooth/dental problems, sore throat,       sneezing, itching, ear ache, nasal congestion, post nasal drip, snoring CV:    chest pain, orthopnea, PND, swelling in lower extremities, anasarca,                          dizziness, palpitations Resp:   shortness of breath with exertion or at rest.                +productive cough,   non-productive cough, coughing up of blood.              change in color of mucus.  wheezing.   Skin:    rash or lesions. GI:  No-   heartburn, indigestion, abdominal pain, nausea, vomiting, diarrhea,                 change in bowel habits, loss of appetite GU: dysuria, change in color of urine,  no urgency or frequency.   flank pain. MS:   joint pain, stiffness, decreased range of motion, back pain. Neuro-     nothing unusual Psych:  change in mood or affect.  depression or anxiety.   memory loss.  OBJ- Physical Exam General- Alert, Oriented, Affect-appropriate, Distress- none acute, + obese Skin- rash-none, lesions- none, excoriation- none Lymphadenopathy- none Head-             Eyes- Gross vision intact, PERRLA, conjunctivae and secretions clear             Ears- Hearing, canals-normal            Nose- Clear, no-Septal dev, mucus, polyps, erosion, perforation             Throat- Mallampati II , mucosa clear , drainage- none, tonsils- atrophic, + teeth Neck- flexible , trachea midline, no stridor , thyroid nl, carotid no bruit Chest - symmetrical excursion , unlabored           Heart/CV- RRR , no murmur , no gallop  , no rub, nl s1 s2                           - JVD- none , edema- none, stasis changes- none, varices- none           Lung- clear to P&A, wheeze- none, cough- none , dullness-none, rub- none           Chest wall-  Abd-  Br/ Gen/ Rectal- Not done, not indicated Extrem- cyanosis- none, clubbing, none, atrophy- none, strength- nl Neuro- grossly intact to observation

## 2021-05-02 ENCOUNTER — Ambulatory Visit (INDEPENDENT_AMBULATORY_CARE_PROVIDER_SITE_OTHER): Payer: Medicare Other | Admitting: Internal Medicine

## 2021-05-02 ENCOUNTER — Encounter: Payer: Self-pay | Admitting: Internal Medicine

## 2021-05-02 ENCOUNTER — Ambulatory Visit (INDEPENDENT_AMBULATORY_CARE_PROVIDER_SITE_OTHER): Payer: Medicare Other

## 2021-05-02 ENCOUNTER — Telehealth: Payer: Self-pay | Admitting: Internal Medicine

## 2021-05-02 ENCOUNTER — Other Ambulatory Visit: Payer: Self-pay

## 2021-05-02 VITALS — BP 140/90 | HR 69 | Temp 97.6°F | Ht 66.0 in | Wt 198.2 lb

## 2021-05-02 DIAGNOSIS — J45909 Unspecified asthma, uncomplicated: Secondary | ICD-10-CM

## 2021-05-02 DIAGNOSIS — R0683 Snoring: Secondary | ICD-10-CM

## 2021-05-02 DIAGNOSIS — R059 Cough, unspecified: Secondary | ICD-10-CM | POA: Diagnosis not present

## 2021-05-02 DIAGNOSIS — Z23 Encounter for immunization: Secondary | ICD-10-CM | POA: Diagnosis not present

## 2021-05-02 MED ORDER — PROMETHAZINE-CODEINE 6.25-10 MG/5ML PO SYRP: 5.0000 mL | ORAL_SOLUTION | Freq: Four times a day (QID) | ORAL | 0 refills | Status: DC | PRN

## 2021-05-02 NOTE — Assessment & Plan Note (Signed)
Mild cough with little wheeze- she questions heart, Plan- CXR. Refill cough syrup with discussion. Senior flu vax.

## 2021-05-02 NOTE — Assessment & Plan Note (Signed)
Discussed possible OSA pertinent to her cardiac status Plan- sleep study. She may prefer oral appliance

## 2021-05-02 NOTE — Patient Instructions (Addendum)
Order- flu ax- senior  Order- CXR   dx cough  Order- schedule home sleep test    dx snoring  Please call us about 2 weeks after your sleep test for results and recommendation.

## 2021-05-03 ENCOUNTER — Encounter: Payer: Medicare Other | Admitting: Counselor

## 2021-05-03 ENCOUNTER — Telehealth: Payer: Self-pay | Admitting: Internal Medicine

## 2021-05-03 MED ORDER — HYDROCODONE BIT-HOMATROP MBR 5-1.5 MG/5ML PO SOLN: 5.0000 mL | Freq: Four times a day (QID) | ORAL | 0 refills | Status: DC | PRN

## 2021-05-03 NOTE — Progress Notes (Signed)
Spoke with pt and she voices understanding. Pt did not have any further questions.

## 2021-05-03 NOTE — Telephone Encounter (Signed)
I have called the pt and she stated that she has checked with her pharmacy and none of the [facility] locally carry this cough med.  She did check with [facility] and they have not carried the promethazine-codeine cough med in years.  Pt is wanting to see if CY would call another cough med into her pharmacy that will be the same or at least have the codeine in it.  CY please advise. Thanks

## 2021-05-03 NOTE — Telephone Encounter (Signed)
Initial Leqvio approval exp 05/04/2021 -- not initiated due to delay in billing processes with health system [facility] company @ [phone] Pending authorization # [id number]  Total time: 30 mins

## 2021-05-03 NOTE — Telephone Encounter (Signed)
I have sent script for hycodan cough syrup to [facility]. It should be similar to the codeine cough syrup.

## 2021-05-06 NOTE — Telephone Encounter (Signed)
I have called and LM on VM to make the pt aware.

## 2021-05-10 ENCOUNTER — Ambulatory Visit (INDEPENDENT_AMBULATORY_CARE_PROVIDER_SITE_OTHER): Payer: Medicare Other | Admitting: Physician Assistant

## 2021-05-10 DIAGNOSIS — R419 Unspecified symptoms and signs involving cognitive functions and awareness: Secondary | ICD-10-CM
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
## 2021-05-13 ENCOUNTER — Other Ambulatory Visit: Payer: Self-pay

## 2021-05-13 ENCOUNTER — Ambulatory Visit (INDEPENDENT_AMBULATORY_CARE_PROVIDER_SITE_OTHER): Payer: Medicare Other | Admitting: Physician Assistant

## 2021-05-13 ENCOUNTER — Encounter: Payer: Self-pay | Admitting: Physician Assistant

## 2021-05-13 VITALS — BP 138/60 | HR 84 | Resp 20 | Ht 66.0 in | Wt 196.0 lb

## 2021-05-13 DIAGNOSIS — R419 Unspecified symptoms and signs involving cognitive functions and awareness: Secondary | ICD-10-CM | POA: Diagnosis not present

## 2021-05-13 NOTE — Patient Instructions (Signed)
It was a pleasure to see you today at our office.   Recommendations:  Follow up as needed   RECOMMENDATIONS FOR ALL PATIENTS WITH MEMORY PROBLEMS: 1. Continue to exercise (Recommend 30 minutes of walking everyday, or 3 hours every week) 2. Increase social interactions - continue going to [place] and enjoy social gatherings with friends and family 3. Eat healthy, avoid fried foods and eat more fruits and vegetables 4. Maintain adequate blood pressure, blood sugar, and blood cholesterol level. Reducing the risk of stroke and cardiovascular disease also helps promoting better memory. 5. Avoid stressful situations. Live a simple life and avoid aggravations. Organize your time and prepare for the next day in anticipation. 6. Sleep well, avoid any interruptions of sleep and avoid any distractions in the bedroom that may interfere with adequate sleep quality 7. Avoid sugar, avoid sweets as there is a strong link between excessive sugar intake, diabetes, and cognitive impairment We discussed the Mediterranean diet, which has been shown to help patients reduce the risk of progressive memory disorders and reduces cardiovascular risk. This includes eating fish, eat fruits and green leafy vegetables, nuts like almonds and hazelnuts, walnuts, and also use olive oil. Avoid fast foods and fried foods as much as possible. Avoid sweets and sugar as sugar use has been linked to worsening of memory function.  There is always a concern of gradual progression of memory problems. If this is the case, then we may need to adjust level of care according to patient needs. Support, both to the patient and caregiver, should then be put into place.    FALL PRECAUTIONS: Be cautious when walking. Scan the area for obstacles that may increase the risk of trips and falls. When getting up in the mornings, sit up at the edge of the bed for a few minutes before getting out of bed. Consider elevating the bed at the head end to avoid  drop of blood pressure when getting up. Walk always in a well-lit room (use night lights in the walls). Avoid area rugs or power cords from appliances in the middle of the walkways. Use a walker or a cane if necessary and consider physical therapy for balance exercise. Get your eyesight checked regularly.  FINANCIAL OVERSIGHT: Supervision, especially oversight when making financial decisions or transactions is also recommended.  HOME SAFETY: Consider the safety of the kitchen when operating appliances like stoves, microwave oven, and blender. Consider having supervision and share cooking responsibilities until no longer able to participate in those. Accidents with firearms and other hazards in the house should be identified and addressed as well.   ABILITY TO BE LEFT ALONE: If patient is unable to contact 911 operator, consider using LifeLine, or when the need is there, arrange for someone to stay with patients. Smoking is a fire hazard, consider supervision or cessation. Risk of wandering should be assessed by caregiver and if detected at any point, supervision and safe proof recommendations should be instituted.  MEDICATION SUPERVISION: Inability to self-administer medication needs to be constantly addressed. Implement a mechanism to ensure safe administration of the medications.   DRIVING: Regarding driving, in patients with progressive memory problems, driving will be impaired. We advise to have someone else do the driving if trouble finding directions or if minor accidents are reported. Independent driving assessment is available to determine safety of driving.   If you are interested in the driving assessment, you can contact the following:  [employer] in [place]  [facility] [phone]  [facility] [phone] or [phone]

## 2021-05-14 ENCOUNTER — Other Ambulatory Visit: Payer: Self-pay | Admitting: Gastroenterology

## 2021-05-14 ENCOUNTER — Ambulatory Visit: Payer: Medicare Other | Admitting: Physician Assistant

## 2021-05-15 ENCOUNTER — Encounter: Payer: Self-pay | Admitting: Gastroenterology

## 2021-05-15 ENCOUNTER — Ambulatory Visit (INDEPENDENT_AMBULATORY_CARE_PROVIDER_SITE_OTHER): Payer: Medicare Other | Admitting: Gastroenterology

## 2021-05-15 VITALS — BP 108/70 | HR 72 | Ht 66.0 in | Wt 193.4 lb

## 2021-05-15 DIAGNOSIS — R159 Full incontinence of feces: Secondary | ICD-10-CM

## 2021-05-15 DIAGNOSIS — R194 Change in bowel habit: Secondary | ICD-10-CM

## 2021-05-15 DIAGNOSIS — N8189 Other female genital prolapse: Secondary | ICD-10-CM | POA: Diagnosis not present

## 2021-05-15 NOTE — Patient Instructions (Addendum)
Start Benefiber 2 teaspoons in 8 ounces of liquid daily.  Start Miralax 1 capful daily in 8 ounces of liquid.  If you are age 74 or older, your body mass index should be between 23-30. Your Body mass index is 31.22 kg/m. If this is out of the aforementioned range listed, please consider follow up with your Primary Care Provider.  If you are age 73 or younger, your body mass index should be between 19-25. Your Body mass index is 31.22 kg/m. If this is out of the aformentioned range listed, please consider follow up with your Primary Care Provider.   __________________________________________________________  The [facility] providers would like to encourage you to use [facility] to communicate with providers for non-urgent requests or questions.  Due to long hold times on the telephone, sending your provider a message by [facility] may be a faster and more efficient way to get a response.  Please allow 48 business hours for a response.  Please remember that this is for non-urgent requests.
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
## 2021-05-15 NOTE — Progress Notes (Signed)
Assessment and plans noted ?

## 2021-05-17 ENCOUNTER — Telehealth: Payer: Self-pay | Admitting: Cardiology

## 2021-05-17 NOTE — Telephone Encounter (Signed)
Pt c/o of Chest Pain: STAT if CP now or developed within 24 hours  1. Are you having CP right now? No pain in back or neck now has been using lidocaine patches   2. Are you experiencing any other symptoms (ex. SOB, nausea, vomiting, sweating)? Severe pain in neck and back, trouble sleeping, lack of appetite, severe fatigue, nausea, BP dropping and constipation    3. How long have you been experiencing CP? Back pain started two weeks ago, severe neck pain where she couldn't move it started on Monday   4. Is your CP continuous or coming and going? Continuous, neck and back pain so bad she was having trouble walking   5. Have you taken Nitroglycerin? No, didn't feel she needed to  States she feels she has pulled through. Requested an appt with Richardson Dopp in regards to this earlier this week, but was added to the wait list due to no availability.  ?

## 2021-05-17 NOTE — Telephone Encounter (Signed)
Pt is calling in to make a sooner appt with Dr. Johney Frame (former Dr. Meda Coffee pt has not seen Dr. Johney Frame yet) before her appt in November, due to a multitude of complaints she has been experiencing.  Pt mostly see's Richardson Dopp PA-C in the clinic for her cardiac needs, with the exception of Dr. Debara Pickett treating her in advanced lipid clinic.  Pt states over the last few week, and especially this week, she has had neck pain, back pain, low appetite, trouble sleeping, nausea, constipation, intermittent chest discomfort, and feeling fatigued. She states "I overall feel bad."  Pt states her BP's are running all over the place.  Pt states over the last week or so, she gets mild discomfort in her chest, which is relieved with what she reports "herbal medicine for gas." She states she's very gassy, has GERD, and constipated.  She states she gets nauseated with her constipation and "gas issues." She states her appetite is low and she overall feels tired most of the time.   Pt states her neck and back have been bothering her a lot, and uses lidocaine patches to relieve the pain. Pt states she has trouble sleeping and getting comfortable.  She states her blood pressures have been running lower for her at times, with last couple readings she reported to me as so: 124/71, 106/65, and HR's in the 70s.  She states when she feels her pressures dropping, she drinks a glass of water and gets on her stationary bike, and this usually helps with this.   Pt denies any complaints of sob, sob on exertion, palpitations, fluttering, dizziness, lightheadedness, pre-syncopal or syncopal episodes. She reports she has not had to utilize her nitroglycerin during complaints mentioned.   Pt would like an appt sooner than 11/22 with Dr. Johney Frame, but scheduling advised her that there are no availabilities to meet her needs, with either Dr. Johney Frame or an APP in our offices.   She states she has not reached out to her PCP about  complaints yet.   Pt has known CAD with history of anterior ST elevation MI in 11/2017, treated with drug eluting stent to the LAD. She has had multiple cardiac caths in the past.  Pt had recent stress testing done back in Feb 2022, showing normal LVEF and normal stress test.   Pt has a multitude of intolerances and allergies to medications.  Pt would like further advisement on her complaints.  Informed the pt that Dr. Johney Frame is on maternity leave at this time, but I will route her concerns to her in-basket for her covering to further review and advise on this matter. Informed the pt that I will follow-up with her accordingly thereafter,  on next business day when recommendations received.   ED precautions provided to the pt if symptoms were to persist or worsen over the weekend.  Advised her of alarming BP/HR parameters to look out for, as well as educated her on S/S that meet immediate medical attention.   Did advise the pt that being she is having complaints with multiple systems involved, it would be a good start in reaching out to her PCP for an appt, for further evaluation of this on Monday.    Did advise the pt to increase her po water intake, continue with her current regimen, and monitoring her BP/HR.  Advised her she can safely use OTC miralax PRN for constipation, but increasing her water intake will aid with this as well.    Was unable  to find an appt with our office or one of our sister offices for the rest of the month.  Pt states she will touch base with our office on Monday, to report when her PCP can see her.    Pt verbalized understanding and agrees with this plan.

## 2021-05-20 NOTE — Telephone Encounter (Signed)
Spoke with the Donna Bailey and endorsed to her recommendations per Dr. Gasper Sells, covering for Dr. Johney Frame. Donna Bailey states she would like to be seen before starting Imdur. Donna Bailey agreed to come in and see DOD Dr. Gasper Sells on 10/19 at 0930, for complaints mentioned.  She is aware to arrive 15 mins prior to this appt. Donna Bailey states she was able to get an appt with her PCP Dr. Harrington Challenger today for all her other non-cardiac related complaints.  She states she will see her PCP this afternoon. Donna Bailey verbalized understanding and agrees with this plan. Donna Bailey was more than gracious for all the assistance provided.

## 2021-05-20 NOTE — Telephone Encounter (Signed)
Left the pt a message to call the office back to endorse recommendations per Dr. Gasper Sells, covering for Dr. Johney Frame at time the pt called the office.  Will go ahead and hold 48 hr acute slot on Dr. Oralia Rud DOD schedule for this Wednesday 10/19 at 0930.  Will await pt call back to offer and schedule appt.       Werner Lean, MD   Caller: Unspecified (3 days ago,  4:22 PM) History of CAD with multi-systems concerns.  We can offer a trial of Imdur and she can take a DOD spot of next available.   Should see PCP.  If red flag symptoms, she can see ED care as appropriate.   Thanks,  MAC
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
## 2021-05-22 ENCOUNTER — Telehealth: Payer: Self-pay

## 2021-05-22 ENCOUNTER — Other Ambulatory Visit: Payer: Self-pay

## 2021-05-22 ENCOUNTER — Encounter: Payer: Self-pay | Admitting: Internal Medicine

## 2021-05-22 ENCOUNTER — Ambulatory Visit (INDEPENDENT_AMBULATORY_CARE_PROVIDER_SITE_OTHER): Payer: Medicare Other | Admitting: Internal Medicine

## 2021-05-22 VITALS — BP 132/94 | HR 80 | Ht 66.0 in | Wt 192.4 lb

## 2021-05-22 DIAGNOSIS — R5383 Other fatigue: Secondary | ICD-10-CM

## 2021-05-22 DIAGNOSIS — E7800 Pure hypercholesterolemia, unspecified: Secondary | ICD-10-CM

## 2021-05-22 DIAGNOSIS — I2510 Atherosclerotic heart disease of native coronary artery without angina pectoris: Secondary | ICD-10-CM

## 2021-05-22 LAB — CBC
Hematocrit: 41.5 % (ref 34.0–46.6)
Hemoglobin: 13.4 g/dL (ref 11.1–15.9)
MCH: 27.2 pg (ref 26.6–33.0)
MCHC: 32.3 g/dL (ref 31.5–35.7)
MCV: 84 fL (ref 79–97)
Platelets: 384 10*3/uL (ref 150–450)
RBC: 4.92 x10E6/uL (ref 3.77–5.28)
RDW: 14.1 % (ref 11.7–15.4)
WBC: 11.3 10*3/uL — ABNORMAL HIGH (ref 3.4–10.8)

## 2021-05-22 NOTE — Telephone Encounter (Signed)
Called insurance company to check on status of pending PA I was notified that Donna Bailey is approved 05/05/2021 - 05/05/2022

## 2021-05-22 NOTE — Patient Instructions (Addendum)
Medication Instructions:  Your physician has recommended you make the following change in your medication:  We have removed amlodipine (Norvasc) from your medication list.    We have sent a message to our pharmacist to review your allergies and ability to take Aspirin.  We will contact you with results.   *If you need a refill on your cardiac medications before your next appointment, please call your pharmacy*   Lab Work: TODAY: CBC If you have labs (blood work) drawn today and your tests are completely normal, you will receive your results only by: [place] (if you have MyChart) OR A paper copy in the mail If you have any lab test that is abnormal or we need to change your treatment, we will call you to review the results.   Testing/Procedures: NONE   Follow-Up: At [facility], you and your health needs are our priority.  As part of our continuing mission to provide you with exceptional heart care, we have created designated Provider Care Teams.  These Care Teams include your primary Cardiologist (physician) and Advanced Practice Providers (APPs -  Physician Assistants and Nurse Practitioners) who all work together to provide you with the care you need, when you need it.   Your next appointment:   3 month(s)  The format for your next appointment:   In Person  Provider:   You may see Freada Bergeron, MD or one of the following Advanced Practice Providers on your designated Care Team:   Melina Copa, PA-C Ermalinda Barrios, PA-C

## 2021-05-22 NOTE — Telephone Encounter (Signed)
Update on med approval, benefits sent via [place]

## 2021-05-22 NOTE — Addendum Note (Signed)
Addended by: Rudean Haskell A on: 05/22/2021 12:05 PM   Modules accepted: Level of Service

## 2021-05-22 NOTE — Telephone Encounter (Addendum)
Called pt to review MD recommendation for clopidogrel (Plavix) 75 mg.  Pt reports that the only way she can tolerate plavix is if someone calls the manufacture and ask if they use corn or any corn derivative in the medication.  She says that she can tolerate sublingual aspirin.  Pt also reports that her BP is low 89/56-85.  I advised her to increase fluid intake and eat a salty snack.  She denies lightheadedness and dizziness.  Pt is expressing that she is having symptoms similar to previous heart attack.  Pt is requesting to have a heart cath.  I advised pt to go to the ED for evaluation.  She then expresses that if she has dizziness or arm pain then she will go to the ED.  She has her suitcase ready to go.  She is requesting HBO sheets for when she goes to the hospital for heart cath.   She has an allergy to certain sheets.  I again advised her to report to the ED if she is having symptoms similar to what she had when she had previous heart attack.   Pt continues to express that it takes 2 weeks to get HBO sheets.  I again advised her to go to ED for evaluation.  Pt is again requesting to have a scheduled heart cath so she can have special sheets in place.   I have spent 20 plus minutes on the phone with patient.

## 2021-05-23 ENCOUNTER — Other Ambulatory Visit: Payer: Self-pay

## 2021-05-23 ENCOUNTER — Encounter (HOSPITAL_COMMUNITY): Payer: Self-pay | Admitting: Emergency Medicine

## 2021-05-23 ENCOUNTER — Observation Stay (HOSPITAL_COMMUNITY)
Admission: EM | Admit: 2021-05-23 | Discharge: 2021-05-27 | Disposition: A | Discharge status: Home / Self Care | Payer: Medicare Other | Attending: Internal Medicine | Admitting: Internal Medicine

## 2021-05-23 ENCOUNTER — Emergency Department (HOSPITAL_COMMUNITY): Payer: Medicare Other

## 2021-05-23 DIAGNOSIS — R0789 Other chest pain: Secondary | ICD-10-CM | POA: Diagnosis not present

## 2021-05-23 DIAGNOSIS — R079 Chest pain, unspecified: Secondary | ICD-10-CM

## 2021-05-23 DIAGNOSIS — Z20822 Contact with and (suspected) exposure to covid-19: Secondary | ICD-10-CM | POA: Diagnosis not present

## 2021-05-23 DIAGNOSIS — Z79899 Other long term (current) drug therapy: Secondary | ICD-10-CM | POA: Insufficient documentation

## 2021-05-23 DIAGNOSIS — N182 Chronic kidney disease, stage 2 (mild): Secondary | ICD-10-CM | POA: Diagnosis not present

## 2021-05-23 DIAGNOSIS — N179 Acute kidney failure, unspecified: Secondary | ICD-10-CM

## 2021-05-23 DIAGNOSIS — Z91040 Latex allergy status: Secondary | ICD-10-CM | POA: Diagnosis not present

## 2021-05-23 DIAGNOSIS — I10 Essential (primary) hypertension: Secondary | ICD-10-CM

## 2021-05-23 DIAGNOSIS — J45909 Unspecified asthma, uncomplicated: Secondary | ICD-10-CM | POA: Insufficient documentation

## 2021-05-23 DIAGNOSIS — I200 Unstable angina: Secondary | ICD-10-CM

## 2021-05-23 DIAGNOSIS — I129 Hypertensive chronic kidney disease with stage 1 through stage 4 chronic kidney disease, or unspecified chronic kidney disease: Secondary | ICD-10-CM | POA: Diagnosis not present

## 2021-05-23 DIAGNOSIS — I2510 Atherosclerotic heart disease of native coronary artery without angina pectoris: Principal | ICD-10-CM | POA: Insufficient documentation

## 2021-05-23 LAB — BASIC METABOLIC PANEL
Anion gap: 11 (ref 5–15)
BUN: 12 mg/dL (ref 8–23)
CO2: 22 mmol/L (ref 22–32)
Calcium: 9.2 mg/dL (ref 8.9–10.3)
Chloride: 106 mmol/L (ref 98–111)
Creatinine, Ser: 1.16 mg/dL — ABNORMAL HIGH (ref 0.44–1.00)
GFR, Estimated: 49 mL/min — ABNORMAL LOW (ref >60)
Glucose, Bld: 107 mg/dL — ABNORMAL HIGH (ref 70–99)
Potassium: 4 mmol/L (ref 3.5–5.1)
Sodium: 139 mmol/L (ref 135–145)

## 2021-05-23 LAB — CBC
HCT: 46.5 % — ABNORMAL HIGH (ref 36.0–46.0)
Hemoglobin: 14.2 g/dL (ref 12.0–15.0)
MCH: 27.1 pg (ref 26.0–34.0)
MCHC: 30.5 g/dL (ref 30.0–36.0)
MCV: 88.7 fL (ref 80.0–100.0)
Platelets: 361 10*3/uL (ref 150–400)
RBC: 5.24 MIL/uL — ABNORMAL HIGH (ref 3.87–5.11)
RDW: 15.3 % (ref 11.5–15.5)
WBC: 12.4 10*3/uL — ABNORMAL HIGH (ref 4.0–10.5)
nRBC: 0 % (ref 0.0–0.2)

## 2021-05-23 LAB — TROPONIN I (HIGH SENSITIVITY)
Troponin I (High Sensitivity): 6 ng/L (ref <18)
Troponin I (High Sensitivity): 5 ng/L (ref <18)

## 2021-05-23 MED ORDER — ONDANSETRON HCL 4 MG/2ML IJ SOLN
4.0000 mg | Freq: Four times a day (QID) | INTRAMUSCULAR | Status: DC | PRN
  Administered 2021-05-24: 4 mg via INTRAVENOUS
  Filled 2021-05-23 (×3): qty 2

## 2021-05-23 MED ORDER — SODIUM CHLORIDE 0.9 % IV BOLUS
500.0000 mL | Freq: Once | INTRAVENOUS | Status: AC
  Administered 2021-05-24: 500 mL via INTRAVENOUS

## 2021-05-23 MED ORDER — NITROGLYCERIN 0.4 MG SL SUBL
0.4000 mg | SUBLINGUAL_TABLET | SUBLINGUAL | Status: DC | PRN
  Filled 2021-05-23: qty 1

## 2021-05-23 NOTE — ED Triage Notes (Signed)
Pt BIB [facility] from home, c/o neck, back and arm pain, as well as intermittent chest pain. Also states she checks her blood pressure at home, and it is occasionally low.   Pt also brought her own linens and requests for those to be used on ED stretcher due to a polyester allergy.
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
## 2021-05-23 NOTE — Telephone Encounter (Signed)
Werner Lean, MD to Precious Gilding, RN  Me  Supple, Harlon Flor, RPH-CPP   [facility]   9:18 PM  Thanks for everyone's hard work today.   Because we have all been working as part of her care team this week, in case she is calls back and I'm not available:  - Patient notes that she is confident that this her her heart plan and that she needs a LCX intervention, despite R radial dissection and hematoma, she is amenable to [place] by a R femoral approach  - we advised that her to hold her norvasc; her follow up blood pressure is still fairly low, she also has abdominal pain, back pain, closing up of her throat, dysphagia, and constipation.  I worry she is heading to a [facility] prolonged admission to work through these myriad of issues  - She has had symptomatic hypotension, for which she uses a wrist cuff and saves her Bps, last BP this PM was 89/55 with profound fatigue and without her anti-hypertensives, we advised BP eval  - Hgb was ok, if she tolerates her return to her ASA  without issue, she may end up needing a LHC for definitive evaluation of her arteries.    - she has follow up with Kiran Lapine and Dr. Johney Frame 06/25/21.   Thanks so much for all of your help,   MAC
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
## 2021-05-23 NOTE — ED Provider Notes (Signed)
Emergency Medicine Provider Triage Evaluation Note  Donna Bailey , a 75 y.o. female  was evaluated in triage.  Pt complains of intermittent chest pain and intermittent blood pressure changes.  She is a home blood pressure cuff which has been reading hypotensive at home.  She is also been having intermittent chest pain, history of cardiac stent placement.  Her most recent echo was in February did not show any changes in her ejection fracture.  Patient was seen yesterday by her cardiologist and advised to return to the ED if she has low blood pressure.  Review of Systems  Positive: Chest pain, low blood pressure Negative: Syncope, nausea, vomiting  Physical Exam  BP (!) 155/77 (BP Location: Right Arm)   Pulse 76   Temp 98.7 F (37.1 C) (Oral)   Resp 14   SpO2 98%  Gen:   Awake, no distress   Resp:  Normal effort  MSK:   Moves extremities without difficulty  Other:    Medical Decision Making  Medically screening exam initiated at 5:31 PM.  Appropriate orders placed.  MIEKO KNEEBONE was informed that the remainder of the evaluation will be completed by another provider, this initial triage assessment does not replace that evaluation, and the importance of remaining in the ED until their evaluation is complete.  Chest pain work-up.   Sherrill Raring, PA-C 05/23/21 [place], [place], DO 05/24/21 2000

## 2021-05-24 ENCOUNTER — Observation Stay (HOSPITAL_COMMUNITY): Payer: Medicare Other

## 2021-05-24 ENCOUNTER — Encounter (HOSPITAL_COMMUNITY): Payer: Self-pay | Admitting: Family Medicine

## 2021-05-24 DIAGNOSIS — I2510 Atherosclerotic heart disease of native coronary artery without angina pectoris: Secondary | ICD-10-CM | POA: Diagnosis not present

## 2021-05-24 DIAGNOSIS — R079 Chest pain, unspecified: Secondary | ICD-10-CM | POA: Diagnosis not present

## 2021-05-24 LAB — GLUCOSE, CAPILLARY
Glucose-Capillary: 108 mg/dL — ABNORMAL HIGH (ref 70–99)
Glucose-Capillary: 92 mg/dL (ref 70–99)

## 2021-05-24 LAB — RESP PANEL BY RT-PCR (FLU A&B, COVID) ARPGX2
Influenza A by PCR: NEGATIVE
Influenza B by PCR: NEGATIVE
SARS Coronavirus 2 by RT PCR: NEGATIVE

## 2021-05-24 MED ORDER — HYDRALAZINE HCL 20 MG/ML IJ SOLN
10.0000 mg | Freq: Four times a day (QID) | INTRAMUSCULAR | Status: DC | PRN
  Administered 2021-05-27: 10 mg via INTRAVENOUS

## 2021-05-24 MED ORDER — NITROGLYCERIN 0.4 MG SL SUBL
SUBLINGUAL_TABLET | SUBLINGUAL | Status: AC
  Filled 2021-05-24: qty 2

## 2021-05-24 MED ORDER — NITROGLYCERIN 0.4 MG SL SUBL
0.8000 mg | SUBLINGUAL_TABLET | Freq: Once | SUBLINGUAL | Status: DC
Start: 2021-05-24 — End: 2021-05-27

## 2021-05-24 MED ORDER — IOHEXOL 350 MG/ML SOLN
100.0000 mL | Freq: Once | INTRAVENOUS | Status: AC | PRN
  Administered 2021-05-24: 100 mL via INTRAVENOUS

## 2021-05-24 MED ORDER — METOPROLOL TARTRATE 50 MG PO TABS
50.0000 mg | ORAL_TABLET | Freq: Once | ORAL | Status: AC | PRN
  Administered 2021-05-24: 50 mg via ORAL
  Filled 2021-05-24: qty 1

## 2021-05-24 MED ORDER — ASPIRIN 81 MG PO CHEW: 81.0000 mg | CHEWABLE_TABLET | Freq: Every day | ORAL | Status: DC

## 2021-05-24 MED ORDER — ASPIRIN 81 MG PO CHEW
81.0000 mg | CHEWABLE_TABLET | Freq: Every day | ORAL | Status: DC
  Administered 2021-05-24 – 2021-05-26 (×3): 81 mg via ORAL
  Filled 2021-05-24 (×3): qty 1

## 2021-05-24 MED ORDER — ALUM & MAG HYDROXIDE-SIMETH 200-200-20 MG/5ML PO SUSP
15.0000 mL | Freq: Four times a day (QID) | ORAL | Status: DC | PRN
  Filled 2021-05-24: qty 30

## 2021-05-24 MED ORDER — PANTOPRAZOLE SODIUM 40 MG PO TBEC
40.0000 mg | DELAYED_RELEASE_TABLET | Freq: Every day | ORAL | Status: DC
  Administered 2021-05-24 – 2021-05-27 (×4): 40 mg via ORAL
  Filled 2021-05-24 (×4): qty 1

## 2021-05-24 NOTE — ED Notes (Signed)
Pt is Donna Bailey, ambulatory, denies CP

## 2021-05-24 NOTE — Progress Notes (Addendum)
Progress Note  Patient Name: Donna Bailey Date of Encounter: 05/24/2021  [facility] Cardiologist: Donna Bergeron, MD   Subjective   Intermittent left sided chest pain since September. Also progressive fatigue. Seen by Dr. Dennison Bailey 2 days ago    Inpatient Medications    Scheduled Meds:  aspirin  81 mg Oral Daily   Continuous Infusions:  PRN Meds: nitroGLYCERIN, ondansetron (ZOFRAN) IV   Vital Signs    Vitals:   05/24/21 0130 05/24/21 0215 05/24/21 0250 05/24/21 0316  BP: (!) 126/49  (!) 155/72   Pulse: 80 95 97   Resp:  17 18   Temp:      TempSrc:   Oral   SpO2:  95% 97%   Weight:    87.7 kg  Height:    5\' 6"  (1.676 m)   No intake or output data in the 24 hours ending 05/24/21 0758 Last 3 Weights 05/24/2021 05/22/2021 05/15/2021  Weight (lbs) 193 lb 6.4 oz 192 lb 6.4 oz 193 lb 6.4 oz  Weight (kg) 87.726 kg 87.272 kg 87.726 kg      Telemetry    NSR without significant ventricular ectopy - Personally Reviewed  ECG    NSR without significant ST-T wave changes - Personally Reviewed  Physical Exam   GEN: No acute distress.   Neck: No JVD Cardiac: RRR, no murmurs, rubs, or gallops.  Respiratory: Clear to auscultation bilaterally. GI: Soft, nontender, non-distended  MS: No edema; No deformity. Neuro:  Nonfocal  Psych: Normal affect   Labs    High Sensitivity Troponin:   Recent Labs  Lab 05/23/21 1732 05/23/21 1932  TROPONINIHS 6 5     Chemistry Recent Labs  Lab 05/23/21 1732  NA 139  K 4.0  CL 106  CO2 22  GLUCOSE 107*  BUN 12  CREATININE 1.16*  CALCIUM 9.2  GFRNONAA 49*  ANIONGAP 11    Lipids No results for input(s): CHOL, TRIG, HDL, LABVLDL, LDLCALC, CHOLHDL in the last 168 hours.  Hematology Recent Labs  Lab 05/22/21 1113 05/23/21 1732  WBC 11.3* 12.4*  RBC 4.92 5.24*  HGB 13.4 14.2  HCT 41.5 46.5*  MCV 84 88.7  MCH 27.2 27.1  MCHC 32.3 30.5  RDW 14.1 15.3  PLT 384 361   Thyroid No results for  input(s): TSH, FREET4 in the last 168 hours.  BNPNo results for input(s): BNP, PROBNP in the last 168 hours.  DDimer No results for input(s): DDIMER in the last 168 hours.   Radiology    DG Chest 2 View  Result Date: 05/23/2021 CLINICAL DATA:  Chest pain, hypertension EXAM: CHEST - 2 VIEW COMPARISON:  05/02/2021 FINDINGS: Frontal and lateral views of the chest demonstrate an unremarkable cardiac silhouette. No airspace disease, effusion, or pneumothorax. Minimal subsegmental atelectasis at the lung bases. No acute bony abnormality. IMPRESSION: 1. No acute intrathoracic process. Electronically Signed   By: Randa Ngo M.D.   On: 05/23/2021 19:13    Cardiac Studies   Cath 04/26/2019 1. Continued patency of the LAD stent with minimal in-stent restenosis 2. Stable, moderate stenosis of the LCx ostium, mid-diagonal branch, and RCA, all lesions unchanged from previous cath studies 3. Normal LV function with normal LVEDP   Recommend: continued medical therapy. Unclear etiology of cardiac enzyme rise  Patient Profile     75 y.o. female with PMH of CAD s/p 2019 radial artery dissection and hematoma, DES to LAD for STEMI, moderate LVc disease, ICM, CKD stage II presented  for chest pain.   Assessment & Plan    Chest pain  - intermittent chest pain since September. Seen by Dr. Alease Bailey on 10/19 who was considering myoview to test for LCx ischemia if she can tolerate antiplatelet medication for 2 weeks  - since then, she was admitted for the same symptom  - I spoke with Donna Bailey regarding myoview today, however she is more interested in cardiac catheterization for definitive evaluation given concern of her symptom being similar to the previous angina.   CAD: DES to LAD on 11/13/2017. Repeat cath on 01/22/2018, 11/26/2018, 04/26/2019, all showing patent stent with moderate LCx disease  ICM with improved EF: EF 40-45% at the time of MI in Apr 2019, improved to 65% by May 2019.   CKD stage  II      For questions or updates, please contact [place] Please consult [email or web address] for contact info under        Signed, Donna Bailey, [place]  05/24/2021, 7:58 AM    Attending Note:   The patient was seen and examined.  Agree with assessment and plan as noted above.  Changes made to the above note as needed.  Patient seen and independently examined with Donna Deforest, PA .   We discussed all aspects of the encounter. I agree with the assessment and plan as stated above.     Cp.  Has a hx of CAD and stenting of the prox LAD .  Presents with fatigue. Troponins are negative  I have talked with her about Coronary CT angio.  Given her hx of radial artery dissection, I think limiting her invasive procedures as much as possible is wise.   She agrees to have the coronary CT angio.  Ok to DC home if the Coronary CT angio looks ok     I have spent a total of 40 minutes with patient reviewing hospital  notes , telemetry, EKGs, labs and examining patient as well as establishing an assessment and plan that was discussed with the patient.  > 50% of time was spent in direct patient care.    Donna Bailey, Donna Bailey., MD, [facility] 05/24/2021, 10:44 AM [address],  [place] Pager [phone]

## 2021-05-24 NOTE — Telephone Encounter (Signed)
Pt is currently in patient d/t unstable angina.

## 2021-05-24 NOTE — Progress Notes (Signed)
PROGRESS NOTE  DEANNDRA KIRLEY  DOB: 06-Aug-1945  PCP: [name], MD EVO:[id number]  DOA: 05/23/2021  LOS: 0 days  Hospital Day: 2   Chief Complaint  Patient presents with   Chest Pain    Brief narrative: CARROLYN HILMES is a 75 y.o. 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 Patient presented to the ED on 10/20 with complaint of chest pain intermittent since September with progressive fatigue. Seen by cardiology Dr. Gasper Sells in the office today prior, started on nitro sublingual and was recommended to come to the ED for any recurrence of chest pain.  In the ED, patient was afebrile, mildly hypertensive with systolic blood pressure 299B, Labs with WBC count 12.4, hemoglobin 14.2, creatinine 1.16,  Troponin normal at 6 and 5 EKG normal sinus rhythm Chest x-ray unremarkable Kept under observation and hospitalist service Cardiology consult called. See below for details  Subjective: Patient was seen and examined this morning.  Pleasant elderly Caucasian female.  Lying on bed.  Not in distress at this time. Mentions a lot of allergies including allergy to corn which limits her from taking a lot of medicines including Plavix. Overnight blood pressure elevated to 150s  Assessment/Plan: Chest pain History of CAD status post stent -Presented with intermittent chest pain in the setting of prior stents  -Cardiology consult obtained.   -Stress test versus cardiac cath, deferred to cardiology  -Sublingual nitro for chest pain -Noted a plan to do coronary CT scan today.  CAD s/p DES to LAD on 11/13/2017.  -Per cardiology note, repeat cath on 01/22/2018, 11/26/2018, 04/26/2019, all showed patent stent with moderate LCx disease   ICM -EF 40-45% at the time of MI in Apr 2019, improved to 65% by May 2019. -On Lasix 20 mg daily as needed at  home.  Essential hypertension -At home on Lasix as needed, hydralazine as needed -Continue the same plan.   CKD stage II -Creatinine slightly elevated than baseline but I do not think it is enough to call AKI at this time. Recent Labs    05/23/21 1732  BUN 12  CREATININE 1.16*   Mobility: Encourage ambulation Code Status:   Code Status: Full Code  Nutritional status: Body mass index is 31.22 kg/m.     Diet:  Diet Order             Diet Heart Room service appropriate? Yes; Fluid consistency: Thin  Diet effective now           Diet - low sodium heart healthy                  DVT prophylaxis:  SCDs Start: 05/23/21 2317   Antimicrobials: None Fluid: None Consultants: Cardiology Family Communication: None at bedside  Status is: Observation  Remains inpatient appropriate because: Pending cardiac work-up  Dispo: The patient is from: Home              Anticipated d/c is to: Home this afternoon versus tomorrow              Patient currently is not medically stable to d/c.   Difficult to place patient No     Infusions:    Scheduled Meds:  aspirin  81 mg Oral Daily   nitroGLYCERIN  0.8 mg Sublingual Once    Antimicrobials: Anti-infectives (From admission, onward)    None  PRN meds: hydrALAZINE, nitroGLYCERIN, ondansetron (ZOFRAN) IV   Objective: Vitals:   05/24/21 0810 05/24/21 1355  BP: (!) 171/63 (!) 162/57  Pulse: 70 (!) 59  Resp: 18 18  Temp: 98.2 F (36.8 C) 98.6 F (37 C)  SpO2: 95% 98%   No intake or output data in the 24 hours ending 05/24/21 1828 Filed Weights   05/24/21 0316  Weight: 87.7 kg   Weight change:  Body mass index is 31.22 kg/m.   Physical Exam: General exam: Pleasant, elderly Caucasian female.  Not in physical distress at this time Skin: No rashes, lesions or ulcers. HEENT: Atraumatic, normocephalic, no obvious bleeding Lungs: Clear to auscultation bilaterally CVS: Regular rate and rhythm, no  murmur GI/Abd soft, nontender, nondistended, bowel sound present CNS: Alert, awake, oriented x3 Psychiatry: Mood appropriate Extremities: No pedal edema, no calf tenderness  Data Review: I have personally reviewed the laboratory data and studies available.  Recent Labs  Lab 05/22/21 1113 05/23/21 1732  WBC 11.3* 12.4*  HGB 13.4 14.2  HCT 41.5 46.5*  MCV 84 88.7  PLT 384 361   Recent Labs  Lab 05/23/21 1732  NA 139  K 4.0  CL 106  CO2 22  GLUCOSE 107*  BUN 12  CREATININE 1.16*  CALCIUM 9.2    F/u labs ordered Unresulted Labs (From admission, onward)    None       Signed, Terrilee Croak, MD [facility] 05/24/2021

## 2021-05-25 DIAGNOSIS — R079 Chest pain, unspecified: Secondary | ICD-10-CM | POA: Diagnosis not present

## 2021-05-25 MED ORDER — ONDANSETRON HCL 4 MG PO TABS
4.0000 mg | ORAL_TABLET | Freq: Three times a day (TID) | ORAL | Status: DC | PRN
  Administered 2021-05-25: 4 mg via ORAL
  Filled 2021-05-25: qty 1

## 2021-05-25 NOTE — Progress Notes (Signed)
Progress Note  Patient Name: Donna Bailey Date of Encounter: 05/25/2021  [facility] Cardiologist: Freada Bergeron, MD   Subjective   CT angio yesterday with multiple blown IVs. Still having intermittent chest pain this morning.   Inpatient Medications    Scheduled Meds:  aspirin  81 mg Oral Daily   nitroGLYCERIN  0.8 mg Sublingual Once   pantoprazole  40 mg Oral Daily   Continuous Infusions:  PRN Meds: hydrALAZINE, nitroGLYCERIN, ondansetron   Vital Signs    Vitals:   05/24/21 2106 05/25/21 0102 05/25/21 0635 05/25/21 0758  BP:  (!) 126/56 (!) 157/72 (!) 123/58  Pulse:   60 60  Resp: 18 18 18 18   Temp: 98 F (36.7 C) 98.2 F (36.8 C) 98.5 F (36.9 C) 98.1 F (36.7 C)  TempSrc:   Oral Oral  SpO2:  96% 96% 96%  Weight:      Height:       No intake or output data in the 24 hours ending 05/25/21 1032 Last 3 Weights 05/24/2021 05/22/2021 05/15/2021  Weight (lbs) 193 lb 6.4 oz 192 lb 6.4 oz 193 lb 6.4 oz  Weight (kg) 87.726 kg 87.272 kg 87.726 kg      Telemetry    Personally Reviewed  ECG    Personally Reviewed  Physical Exam   GEN: No acute distress.   Neck: No JVD Cardiac: RRR, no murmurs, rubs, or gallops.  Respiratory: Clear to auscultation bilaterally. GI: Soft, nontender, non-distended  MS: No edema; No deformity. Neuro:  Nonfocal  Psych: Normal affect   Labs    High Sensitivity Troponin:   Recent Labs  Lab 05/23/21 1732 05/23/21 1932  TROPONINIHS 6 5     Chemistry Recent Labs  Lab 05/23/21 1732  NA 139  K 4.0  CL 106  CO2 22  GLUCOSE 107*  BUN 12  CREATININE 1.16*  CALCIUM 9.2  GFRNONAA 49*  ANIONGAP 11    Lipids No results for input(s): CHOL, TRIG, HDL, LABVLDL, LDLCALC, CHOLHDL in the last 168 hours.  Hematology Recent Labs  Lab 05/22/21 1113 05/23/21 1732  WBC 11.3* 12.4*  RBC 4.92 5.24*  HGB 13.4 14.2  HCT 41.5 46.5*  MCV 84 88.7  MCH 27.2 27.1  MCHC 32.3 30.5  RDW 14.1 15.3  PLT 384 361    Thyroid No results for input(s): TSH, FREET4 in the last 168 hours.  BNPNo results for input(s): BNP, PROBNP in the last 168 hours.  DDimer No results for input(s): DDIMER in the last 168 hours.   Radiology    DG Chest 2 View  Result Date: 05/23/2021 CLINICAL DATA:  Chest pain, hypertension EXAM: CHEST - 2 VIEW COMPARISON:  05/02/2021 FINDINGS: Frontal and lateral views of the chest demonstrate an unremarkable cardiac silhouette. No airspace disease, effusion, or pneumothorax. Minimal subsegmental atelectasis at the lung bases. No acute bony abnormality. IMPRESSION: 1. No acute intrathoracic process. Electronically Signed   By: Randa Ngo M.D.   On: 05/23/2021 19:13    Cardiac Studies   No new  Patient Profile     75 y.o. female with PMH of CAD s/p 2019 radial artery dissection and hematoma, DES to LAD for STEMI, moderate LVc disease, ICM, CKD stage II presented for chest pain.   Assessment & Plan    #Chest pain Still having intermittent chest pain.  Unfortunately, CT angiogram yesterday not performed because of multiple blown IVs.  I do think evaluation of her coronaries is still warranted.  Discussed using left heart catheterization to better evaluate the coronaries.  This will need to be done on Monday.  Please keep n.p.o. after midnight on Sunday.  Given problems with radial access in the past, would favor femoral arterial access.  #Ischemic cardiomyopathy with improved ejection fraction NYHA class II-III.  EF last measured as 65%.    For questions or updates, please contact [place] Please consult [email or web address] for contact info under        Signed, Vickie Epley, MD  05/25/2021, 10:32 AM

## 2021-05-25 NOTE — Progress Notes (Signed)
PROGRESS NOTE  Donna Bailey  DOB: 03/15/1946  PCP: [name], MD WPY:[id number]  DOA: 05/23/2021  LOS: 0 days  Hospital Day: 3   Chief Complaint  Patient presents with   Chest Pain    Brief narrative: Donna Bailey is a 75 y.o. 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 Patient presented to the ED on 10/20 with complaint of chest pain intermittent since September with progressive fatigue. Seen by cardiology Dr. Gasper Sells in the office today prior, started on nitro sublingual and was recommended to come to the ED for any recurrence of chest pain.  In the ED, patient was afebrile, mildly hypertensive with systolic blood pressure 053Z, Labs with WBC count 12.4, hemoglobin 14.2, creatinine 1.16,  Troponin normal at 6 and 5 EKG normal sinus rhythm Chest x-ray unremarkable Kept under observation and hospitalist service Cardiology consult called. See below for details  Subjective: Patient was seen and examined this morning.  Lying on bed.  Continues to have intermittent chest pain.  She was planned for coronary CT yesterday but could not get it done because of multiple blown IV sites.   Assessment/Plan: Chest pain History of CAD status post stent -Presented with intermittent chest pain in the setting of prior stents  -Cardiology consult obtained.   -Coronary CT could not be done because of multiple blown IVs.  Noted a plan from cardiology for left heart catheter Monday. -Sublingual nitro for chest pain  CAD s/p DES to LAD on 11/13/2017.  -Per cardiology note, repeat cath on 01/22/2018, 11/26/2018, 04/26/2019, all showed patent stent with moderate LCx disease   ICM -EF 40-45% at the time of MI in Apr 2019, improved to 65% by May 2019. -On Lasix 20 mg daily as needed at home.  Essential hypertension -At home on Lasix as  needed, hydralazine as needed -Continue the same plan.   CKD stage II -Creatinine slightly elevated than baseline but I do not think it is enough to call AKI at this time. -Repeat labs tomorrow. Recent Labs    05/23/21 1732  BUN 12  CREATININE 1.16*   Multiple allergies -Patient has 37 different listed allergies.  Not clear if all of them are true.  Patient states he got a nausea medicine this morning and started itching her eyes.  She believes she is allergic to that and would not like to take it again.  In chart review I noted that she got Zofran which she has always been taking at home.  Mobility: Encourage ambulation Code Status:   Code Status: Full Code  Nutritional status: Body mass index is 31.22 kg/m.     Diet:  Diet Order             Diet Heart Room service appropriate? Yes; Fluid consistency: Thin  Diet effective now           Diet - low sodium heart healthy                  DVT prophylaxis:  SCDs Start: 05/23/21 2317   Antimicrobials: None Fluid: None Consultants: Cardiology Family Communication: None at bedside  Status is: Observation  Remains inpatient appropriate because: Pending cardiac work-up  Dispo: The patient is from: Home              Anticipated d/c is to: [facility] on Monday  Patient currently is not medically stable to d/c.   Difficult to place patient No     Infusions:    Scheduled Meds:  aspirin  81 mg Oral Daily   nitroGLYCERIN  0.8 mg Sublingual Once   pantoprazole  40 mg Oral Daily    Antimicrobials: Anti-infectives (From admission, onward)    None       PRN meds: hydrALAZINE, nitroGLYCERIN, ondansetron   Objective: Vitals:   05/25/21 0635 05/25/21 0758  BP: (!) 157/72 (!) 123/58  Pulse: 60 60  Resp: 18 18  Temp: 98.5 F (36.9 C) 98.1 F (36.7 C)  SpO2: 96% 96%   No intake or output data in the 24 hours ending 05/25/21 1052 Filed Weights   05/24/21 0316  Weight: 87.7 kg   Weight change:   Body mass index is 31.22 kg/m.   Physical Exam: General exam: Pleasant, elderly Caucasian female.  Not in physical distress at this time Skin: No rashes, lesions or ulcers. HEENT: Atraumatic, normocephalic, no obvious bleeding Lungs: Clear to auscultation bilaterally CVS: Regular rate and rhythm, no murmur GI/Abd soft, nontender, nondistended, bowel sound present CNS: Alert, awake, oriented x3 Psychiatry: Mood appropriate Extremities: No pedal edema, no calf tenderness  Data Review: I have personally reviewed the laboratory data and studies available.  Recent Labs  Lab 05/22/21 1113 05/23/21 1732  WBC 11.3* 12.4*  HGB 13.4 14.2  HCT 41.5 46.5*  MCV 84 88.7  PLT 384 361   Recent Labs  Lab 05/23/21 1732  NA 139  K 4.0  CL 106  CO2 22  GLUCOSE 107*  BUN 12  CREATININE 1.16*  CALCIUM 9.2    F/u labs ordered Unresulted Labs (From admission, onward)    None       Signed, Terrilee Croak, MD [facility] 05/25/2021

## 2021-05-25 NOTE — Plan of Care (Signed)
  Problem: Clinical Measurements: Goal: Respiratory complications will improve Outcome: Progressing   Problem: Activity: Goal: Risk for activity intolerance will decrease Outcome: Progressing   

## 2021-05-26 DIAGNOSIS — R079 Chest pain, unspecified: Secondary | ICD-10-CM | POA: Diagnosis not present

## 2021-05-26 DIAGNOSIS — N179 Acute kidney failure, unspecified: Secondary | ICD-10-CM | POA: Diagnosis not present

## 2021-05-26 LAB — CBC WITH DIFFERENTIAL/PLATELET
Abs Immature Granulocytes: 0.04 10*3/uL (ref 0.00–0.07)
Basophils Absolute: 0.1 10*3/uL (ref 0.0–0.1)
Basophils Relative: 1 %
Eosinophils Absolute: 0.5 10*3/uL (ref 0.0–0.5)
Eosinophils Relative: 6 %
HCT: 37.9 % (ref 36.0–46.0)
Hemoglobin: 12.2 g/dL (ref 12.0–15.0)
Immature Granulocytes: 1 %
Lymphocytes Relative: 34 %
Lymphs Abs: 2.9 10*3/uL (ref 0.7–4.0)
MCH: 27.4 pg (ref 26.0–34.0)
MCHC: 32.2 g/dL (ref 30.0–36.0)
MCV: 85.2 fL (ref 80.0–100.0)
Monocytes Absolute: 1 10*3/uL (ref 0.1–1.0)
Monocytes Relative: 11 %
Neutro Abs: 4.1 10*3/uL (ref 1.7–7.7)
Neutrophils Relative %: 47 %
Platelets: 336 10*3/uL (ref 150–400)
RBC: 4.45 MIL/uL (ref 3.87–5.11)
RDW: 15.6 % — ABNORMAL HIGH (ref 11.5–15.5)
WBC: 8.6 10*3/uL (ref 4.0–10.5)
nRBC: 0 % (ref 0.0–0.2)

## 2021-05-26 LAB — BASIC METABOLIC PANEL
Anion gap: 8 (ref 5–15)
BUN: 11 mg/dL (ref 8–23)
CO2: 23 mmol/L (ref 22–32)
Calcium: 8.6 mg/dL — ABNORMAL LOW (ref 8.9–10.3)
Chloride: 107 mmol/L (ref 98–111)
Creatinine, Ser: 1.07 mg/dL — ABNORMAL HIGH (ref 0.44–1.00)
GFR, Estimated: 55 mL/min — ABNORMAL LOW (ref >60)
Glucose, Bld: 111 mg/dL — ABNORMAL HIGH (ref 70–99)
Potassium: 4 mmol/L (ref 3.5–5.1)
Sodium: 138 mmol/L (ref 135–145)

## 2021-05-26 LAB — GLUCOSE, CAPILLARY: Glucose-Capillary: 148 mg/dL — ABNORMAL HIGH (ref 70–99)

## 2021-05-26 MED ORDER — SODIUM CHLORIDE 0.9% FLUSH
3.0000 mL | Freq: Two times a day (BID) | INTRAVENOUS | Status: DC
  Administered 2021-05-27: 3 mL via INTRAVENOUS

## 2021-05-26 MED ORDER — SODIUM CHLORIDE 0.9 % WEIGHT BASED INFUSION
3.0000 mL/kg/h | INTRAVENOUS | Status: DC
  Administered 2021-05-27: 3 mL/kg/h via INTRAVENOUS

## 2021-05-26 MED ORDER — SODIUM CHLORIDE 0.9 % IV SOLN: 250.0000 mL | INTRAVENOUS | Status: DC | PRN

## 2021-05-26 MED ORDER — SODIUM CHLORIDE 0.9 % WEIGHT BASED INFUSION: 1.0000 mL/kg/h | INTRAVENOUS | Status: DC

## 2021-05-26 MED ORDER — ASPIRIN 81 MG PO CHEW
81.0000 mg | CHEWABLE_TABLET | ORAL | Status: AC
  Administered 2021-05-27: 81 mg via ORAL
  Filled 2021-05-26: qty 1

## 2021-05-26 MED ORDER — SODIUM CHLORIDE 0.9% FLUSH: 3.0000 mL | INTRAVENOUS | Status: DC | PRN

## 2021-05-26 MED ORDER — ASPIRIN 81 MG PO CHEW: 81.0000 mg | CHEWABLE_TABLET | Freq: Every day | ORAL | Status: DC

## 2021-05-26 NOTE — Progress Notes (Signed)
Progress Note  Patient Name: Donna Bailey Date of Encounter: 05/26/2021  [facility] Cardiologist: Freada Bergeron, MD   Subjective   NAEO. Planning for [facility] tomorrow.  Inpatient Medications    Scheduled Meds:  aspirin  81 mg Oral Daily   [START ON 05/27/2021] aspirin  81 mg Oral Pre-Cath   nitroGLYCERIN  0.8 mg Sublingual Once   pantoprazole  40 mg Oral Daily   sodium chloride flush  3 mL Intravenous Q12H   Continuous Infusions:  sodium chloride     [START ON 05/27/2021] sodium chloride     Followed by   Derrill Memo ON 05/27/2021] sodium chloride     PRN Meds: sodium chloride, hydrALAZINE, nitroGLYCERIN, ondansetron, sodium chloride flush   Vital Signs    Vitals:   05/25/21 0635 05/25/21 0758 05/25/21 1415 05/26/21 0844  BP: (!) 157/72 (!) 123/58 (!) 137/58 137/69  Pulse: 60 60 68 73  Resp: 18 18 17 18   Temp: 98.5 F (36.9 C) 98.1 F (36.7 C) 98.4 F (36.9 C)   TempSrc: Oral Oral Oral   SpO2: 96% 96% 98% 94%  Weight:      Height:       No intake or output data in the 24 hours ending 05/26/21 0915 Last 3 Weights 05/24/2021 05/22/2021 05/15/2021  Weight (lbs) 193 lb 6.4 oz 192 lb 6.4 oz 193 lb 6.4 oz  Weight (kg) 87.726 kg 87.272 kg 87.726 kg      Telemetry    Personally Reviewed  ECG    Personally Reviewed  Physical Exam   GEN: No acute distress.   Neck: No JVD Cardiac: RRR, no murmurs, rubs, or gallops.  Respiratory: Clear to auscultation bilaterally. GI: Soft, nontender, non-distended  MS: No edema; No deformity. Neuro:  Nonfocal  Psych: Normal affect   Labs    High Sensitivity Troponin:   Recent Labs  Lab 05/23/21 1732 05/23/21 1932  TROPONINIHS 6 5      Chemistry Recent Labs  Lab 05/23/21 1732 05/26/21 0210  NA 139 138  K 4.0 4.0  CL 106 107  CO2 22 23  GLUCOSE 107* 111*  BUN 12 11  CREATININE 1.16* 1.07*  CALCIUM 9.2 8.6*  GFRNONAA 49* 55*  ANIONGAP 11 8     Lipids No results for input(s): CHOL, TRIG,  HDL, LABVLDL, LDLCALC, CHOLHDL in the last 168 hours.  Hematology Recent Labs  Lab 05/22/21 1113 05/23/21 1732 05/26/21 0210  WBC 11.3* 12.4* 8.6  RBC 4.92 5.24* 4.45  HGB 13.4 14.2 12.2  HCT 41.5 46.5* 37.9  MCV 84 88.7 85.2  MCH 27.2 27.1 27.4  MCHC 32.3 30.5 32.2  RDW 14.1 15.3 15.6*  PLT 384 361 336    Thyroid No results for input(s): TSH, FREET4 in the last 168 hours.  BNPNo results for input(s): BNP, PROBNP in the last 168 hours.  DDimer No results for input(s): DDIMER in the last 168 hours.   Radiology    No results found.  Cardiac Studies   No new  Patient Profile     75 y.o. female with PMH of CAD s/p 2019 radial artery dissection and hematoma, DES to LAD for STEMI, moderate LVc disease, ICM, CKD stage II presented for chest pain.   Assessment & Plan    #Chest pain Still having intermittent chest pain.  Unfortunately, CT angiogram not performed because of multiple blown IVs.  I do think evaluation of her coronaries is still warranted.  Discussed using left heart catheterization  to better evaluate the coronaries.  This will need to be done on Monday.  Please keep n.p.o. after midnight on Sunday.  #Ischemic cardiomyopathy with improved ejection fraction NYHA class II-III.  EF last measured as 65%.    For questions or updates, please contact Donna Bailey Please consult [email or web address] for contact info under        Signed, Vickie Epley, MD  05/26/2021, 9:15 AM

## 2021-05-26 NOTE — Progress Notes (Signed)
PROGRESS NOTE  Donna Bailey  DOB: [birth year]  PCP: [name], MD HKV:[id number]  DOA: 05/23/2021  LOS: 0 days  Hospital Day: 4   Chief Complaint  Patient presents with   Chest Pain    Brief narrative: Donna Bailey is a 75 y.o. 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 Patient presented to the ED on 10/20 with complaint of chest pain intermittent since September with progressive fatigue. Seen by cardiology Dr. Gasper Sells in the office today prior, started on nitro sublingual and was recommended to come to the ED for any recurrence of chest pain.  In the ED, patient was afebrile, mildly hypertensive with systolic blood pressure 564P, Labs with WBC count 12.4, hemoglobin 14.2, creatinine 1.16,  Troponin normal at 6 and 5 EKG normal sinus rhythm Chest x-ray unremarkable Kept under observation and hospitalist service Cardiology consult called. See below for details  Subjective: Patient was seen and examined this morning.  Sitting up at the edge of the bed.  Not in distress.  Still having intermittent chest pain.  Assessment/Plan: Chest pain History of CAD status post stent -Presented with intermittent chest pain in the setting of prior stents  -Cardiology consult obtained.   -Coronary CT could not be done because of multiple blown IVs.  Noted a plan from cardiology for left heart catheter Monday. -Sublingual nitro for chest pain  CAD s/p DES to LAD on 11/13/2017.  -Per cardiology note, repeat cath on 01/22/2018, 11/26/2018, 04/26/2019, all showed patent stent with moderate LCx disease   ICM -EF 40-45% at the time of MI in Apr 2019, improved to 65% by May 2019. -On Lasix 20 mg daily as needed at home.  Essential hypertension -At home on Lasix as needed, hydralazine as needed -Continue the same plan.   CKD stage  II -Creatinine slightly elevated than baseline but I do not think it is enough to call AKI at this time. -Repeat labs tomorrow. Recent Labs    05/23/21 1732 05/26/21 0210  BUN 12 11  CREATININE 1.16* 1.07*    Multiple allergies -Patient has 37 different listed allergies.  Not clear if all of them are true.  -Patient brought her own brand of Zofran from home for nausea.  Mobility: Encourage ambulation Code Status:   Code Status: Full Code  Nutritional status: Body mass index is 31.22 kg/m.     Diet:  Diet Order             Diet NPO time specified Except for: Sips with Meds  Diet effective midnight           Diet Heart Room service appropriate? Yes; Fluid consistency: Thin  Diet effective now           Diet - low sodium heart healthy                  DVT prophylaxis:  SCDs Start: 05/23/21 2317   Antimicrobials: None Fluid: None Consultants: Cardiology Family Communication: None at bedside  Status is: Observation  Remains inpatient appropriate because: Pending cardiac cath tomorrow  Dispo: The patient is from: Home              Anticipated d/c is to: Most likely for discharge after cath tomorrow.              Patient currently is not medically stable to d/c.  Difficult to place patient No     Infusions:   sodium chloride     [START ON 05/27/2021] sodium chloride     Followed by   Derrill Memo ON 05/27/2021] sodium chloride      Scheduled Meds:  [START ON 05/27/2021] aspirin  81 mg Oral Pre-Cath   [START ON 05/28/2021] aspirin  81 mg Oral Daily   nitroGLYCERIN  0.8 mg Sublingual Once   pantoprazole  40 mg Oral Daily   sodium chloride flush  3 mL Intravenous Q12H    Antimicrobials: Anti-infectives (From admission, onward)    None       PRN meds: sodium chloride, hydrALAZINE, nitroGLYCERIN, ondansetron, sodium chloride flush   Objective: Vitals:   05/25/21 1415 05/26/21 0844  BP: (!) 137/58 137/69  Pulse: 68 73  Resp: 17 18  Temp: 98.4 F  (36.9 C)   SpO2: 98% 94%   No intake or output data in the 24 hours ending 05/26/21 1142 Filed Weights   05/24/21 0316  Weight: 87.7 kg   Weight change:  Body mass index is 31.22 kg/m.   Physical Exam: General exam: Pleasant, elderly Caucasian female.  Not in distress. Skin: No rashes, lesions or ulcers. HEENT: Atraumatic, normocephalic, no obvious bleeding Lungs: Clear to auscultation bilaterally CVS: Regular rate and rhythm, no murmur GI/Abd soft, nontender, nondistended, bowel sound present CNS: Alert, awake, oriented x3 Psychiatry: Mood appropriate Extremities: No pedal edema, no calf tenderness  Data Review: I have personally reviewed the laboratory data and studies available.  Recent Labs  Lab 05/22/21 1113 05/23/21 1732 05/26/21 0210  WBC 11.3* 12.4* 8.6  NEUTROABS  --   --  4.1  HGB 13.4 14.2 12.2  HCT 41.5 46.5* 37.9  MCV 84 88.7 85.2  PLT 384 361 336    Recent Labs  Lab 05/23/21 1732 05/26/21 0210  NA 139 138  K 4.0 4.0  CL 106 107  CO2 22 23  GLUCOSE 107* 111*  BUN 12 11  CREATININE 1.16* 1.07*  CALCIUM 9.2 8.6*     F/u labs ordered Unresulted Labs (From admission, onward)    None       Signed, Terrilee Croak, MD [facility] 05/26/2021

## 2021-05-27 ENCOUNTER — Encounter (HOSPITAL_COMMUNITY)
Admission: EM | Disposition: A | Discharge status: Home / Self Care | Payer: Self-pay | Source: Home / Self Care | Attending: Emergency Medicine

## 2021-05-27 DIAGNOSIS — I2510 Atherosclerotic heart disease of native coronary artery without angina pectoris: Secondary | ICD-10-CM | POA: Diagnosis not present

## 2021-05-27 DIAGNOSIS — Z20822 Contact with and (suspected) exposure to covid-19: Secondary | ICD-10-CM | POA: Diagnosis not present

## 2021-05-27 DIAGNOSIS — I129 Hypertensive chronic kidney disease with stage 1 through stage 4 chronic kidney disease, or unspecified chronic kidney disease: Secondary | ICD-10-CM | POA: Diagnosis not present

## 2021-05-27 DIAGNOSIS — I25110 Atherosclerotic heart disease of native coronary artery with unstable angina pectoris: Secondary | ICD-10-CM | POA: Diagnosis not present

## 2021-05-27 DIAGNOSIS — I10 Essential (primary) hypertension: Secondary | ICD-10-CM | POA: Diagnosis not present

## 2021-05-27 DIAGNOSIS — J45909 Unspecified asthma, uncomplicated: Secondary | ICD-10-CM | POA: Diagnosis not present

## 2021-05-27 DIAGNOSIS — R079 Chest pain, unspecified: Secondary | ICD-10-CM | POA: Diagnosis not present

## 2021-05-27 HISTORY — PX: CORONARY PRESSURE/FFR STUDY: CATH118243

## 2021-05-27 HISTORY — PX: LEFT HEART CATH AND CORONARY ANGIOGRAPHY: CATH118249

## 2021-05-27 LAB — POCT ACTIVATED CLOTTING TIME
Activated Clotting Time: 265 seconds
Activated Clotting Time: 173 seconds

## 2021-05-27 SURGERY — LEFT HEART CATH AND CORONARY ANGIOGRAPHY
Anesthesia: LOCAL

## 2021-05-27 MED ORDER — MIDAZOLAM HCL 2 MG/2ML IJ SOLN
INTRAMUSCULAR | Status: DC | PRN
  Administered 2021-05-27: 1 mg via INTRAVENOUS

## 2021-05-27 MED ORDER — DILTIAZEM HCL ER COATED BEADS 120 MG PO CP24
120.0000 mg | ORAL_CAPSULE | Freq: Every day | ORAL | Status: DC
  Administered 2021-05-27: 120 mg via ORAL
  Filled 2021-05-27: qty 1

## 2021-05-27 MED ORDER — LIDOCAINE HCL (PF) 1 % IJ SOLN
INTRAMUSCULAR | Status: AC
  Filled 2021-05-27: qty 30

## 2021-05-27 MED ORDER — SODIUM CHLORIDE 0.9 % IV SOLN: INTRAVENOUS | Status: AC

## 2021-05-27 MED ORDER — SODIUM CHLORIDE 0.9% FLUSH
3.0000 mL | Freq: Two times a day (BID) | INTRAVENOUS | Status: DC
  Administered 2021-05-27: 3 mL via INTRAVENOUS

## 2021-05-27 MED ORDER — NITROGLYCERIN 1 MG/10 ML FOR IR/CATH LAB
INTRA_ARTERIAL | Status: DC | PRN
  Administered 2021-05-27 (×2): 200 ug

## 2021-05-27 MED ORDER — HEPARIN (PORCINE) IN NACL 1000-0.9 UT/500ML-% IV SOLN
INTRAVENOUS | Status: DC | PRN
  Administered 2021-05-27 (×2): 500 mL

## 2021-05-27 MED ORDER — NITROGLYCERIN 1 MG/10 ML FOR IR/CATH LAB
INTRA_ARTERIAL | Status: AC
  Filled 2021-05-27: qty 10

## 2021-05-27 MED ORDER — LABETALOL HCL 5 MG/ML IV SOLN: 10.0000 mg | INTRAVENOUS | Status: AC | PRN

## 2021-05-27 MED ORDER — LIDOCAINE HCL (PF) 1 % IJ SOLN
INTRAMUSCULAR | Status: DC | PRN
  Administered 2021-05-27: 15 mL

## 2021-05-27 MED ORDER — SODIUM CHLORIDE 0.9% FLUSH: 3.0000 mL | INTRAVENOUS | Status: DC | PRN

## 2021-05-27 MED ORDER — DILTIAZEM HCL ER COATED BEADS 120 MG PO CP24: 120.0000 mg | ORAL_CAPSULE | Freq: Every day | ORAL | 0 refills | Status: DC

## 2021-05-27 MED ORDER — HYDRALAZINE HCL 20 MG/ML IJ SOLN
INTRAMUSCULAR | Status: AC
  Filled 2021-05-27: qty 1

## 2021-05-27 MED ORDER — ENOXAPARIN SODIUM 40 MG/0.4ML IJ SOSY: 40.0000 mg | PREFILLED_SYRINGE | INTRAMUSCULAR | Status: DC

## 2021-05-27 MED ORDER — SODIUM CHLORIDE 0.9 % IV SOLN: 250.0000 mL | INTRAVENOUS | Status: DC | PRN

## 2021-05-27 MED ORDER — FENTANYL CITRATE (PF) 100 MCG/2ML IJ SOLN
INTRAMUSCULAR | Status: AC
  Filled 2021-05-27: qty 2

## 2021-05-27 MED ORDER — MIDAZOLAM HCL 2 MG/2ML IJ SOLN
INTRAMUSCULAR | Status: AC
  Filled 2021-05-27: qty 2

## 2021-05-27 MED ORDER — HEPARIN SODIUM (PORCINE) 1000 UNIT/ML IJ SOLN
INTRAMUSCULAR | Status: AC
  Filled 2021-05-27: qty 1

## 2021-05-27 MED ORDER — ASPIRIN 81 MG PO CHEW: 81.0000 mg | CHEWABLE_TABLET | Freq: Every day | ORAL | 2 refills | Status: AC

## 2021-05-27 MED ORDER — HEPARIN (PORCINE) IN NACL 1000-0.9 UT/500ML-% IV SOLN
INTRAVENOUS | Status: AC
  Filled 2021-05-27: qty 1000

## 2021-05-27 MED ORDER — HEPARIN SODIUM (PORCINE) 1000 UNIT/ML IJ SOLN
INTRAMUSCULAR | Status: DC | PRN
  Administered 2021-05-27: 8000 [IU] via INTRAVENOUS

## 2021-05-27 MED ORDER — FENTANYL CITRATE (PF) 100 MCG/2ML IJ SOLN
INTRAMUSCULAR | Status: DC | PRN
  Administered 2021-05-27: 50 ug via INTRAVENOUS

## 2021-05-27 MED ORDER — IOHEXOL 350 MG/ML SOLN
INTRAVENOUS | Status: DC | PRN
  Administered 2021-05-27: 85 mL

## 2021-05-27 SURGICAL SUPPLY — 14 items
CATH INFINITI 5FR MULTPACK ANG (CATHETERS) ×1 IMPLANT
CATH VISTA GUIDE 6FR XBLAD3.5 (CATHETERS) ×1 IMPLANT
GUIDEWIRE PRESSURE X 175 (WIRE) ×1 IMPLANT
KIT ENCORE 26 ADVANTAGE (KITS) ×1 IMPLANT
KIT ESSENTIALS PG (KITS) ×1 IMPLANT
KIT HEART LEFT (KITS) ×2 IMPLANT
KIT MICROPUNCTURE NIT STIFF (SHEATH) ×1 IMPLANT
PACK CARDIAC CATHETERIZATION (CUSTOM PROCEDURE TRAY) ×2 IMPLANT
SHEATH PINNACLE 5F 10CM (SHEATH) ×1 IMPLANT
SHEATH PINNACLE 6F 10CM (SHEATH) ×2 IMPLANT
SHEATH PROBE COVER 6X72 (BAG) ×1 IMPLANT
TRANSDUCER W/STOPCOCK (MISCELLANEOUS) ×2 IMPLANT
TUBING CIL FLEX 10 FLL-RA (TUBING) ×2 IMPLANT
WIRE EMERALD 3MM-J .035X150CM (WIRE) ×1 IMPLANT

## 2021-05-27 NOTE — Progress Notes (Signed)
PIV noted x2, 22g noted to RUE with IV NS infusing, and 22g PIV noted  to RFA, safety maintained

## 2021-05-27 NOTE — Progress Notes (Signed)
Patient complains about both IV sites.  Patient states that once they are flushed they start burning. Educated the patient about the importance of having two IV site when going to the cath lab.  Anderson Malta from the cath lab called and spoke with the patient.  Patient has requested to see the cardiologist before the procedure.  Anderson Malta from the cath lab states that she will send the cardiologist to see the patient. Will continue to follow.   Donah Driver, RN

## 2021-05-27 NOTE — Progress Notes (Signed)
Progress Note  Patient Name: Donna Bailey Date of Encounter: 05/27/2021  [facility] Cardiologist: Freada Bergeron, MD   Subjective   Currently pain free.  Inpatient Medications    Scheduled Meds:  aspirin  81 mg Oral Pre-Cath   [START ON 05/28/2021] aspirin  81 mg Oral Daily   nitroGLYCERIN  0.8 mg Sublingual Once   pantoprazole  40 mg Oral Daily   sodium chloride flush  3 mL Intravenous Q12H   Continuous Infusions:  sodium chloride     sodium chloride     PRN Meds: sodium chloride, hydrALAZINE, nitroGLYCERIN, ondansetron, sodium chloride flush   Vital Signs    Vitals:   05/26/21 0844 05/26/21 1258 05/26/21 2013 05/27/21 0428  BP: 137/69 (!) 125/56 140/69 (!) 161/75  Pulse: 73 70 74 70  Resp: 18 18 18 20   Temp:  98 F (36.7 C) 98.3 F (36.8 C) 98.8 F (37.1 C)  TempSrc:  Oral Oral Oral  SpO2: 94%   96%  Weight:      Height:       No intake or output data in the 24 hours ending 05/27/21 0822 Last 3 Weights 05/24/2021 05/22/2021 05/15/2021  Weight (lbs) 193 lb 6.4 oz 192 lb 6.4 oz 193 lb 6.4 oz  Weight (kg) 87.726 kg 87.272 kg 87.726 kg      Telemetry    NSR, brief PAT ( 7-beats) - Personally Reviewed  ECG    NSR, normal - Personally Reviewed  Physical Exam  Appears comfortable GEN: No acute distress.   Neck: No JVD Cardiac: RRR, no murmurs, rubs, or gallops.  Respiratory: Clear to auscultation bilaterally. GI: Soft, nontender, non-distended  MS: No edema; No deformity. Neuro:  Nonfocal  Psych: Normal affect   Labs    High Sensitivity Troponin:   Recent Labs  Lab 05/23/21 1732 05/23/21 1932  TROPONINIHS 6 5     Chemistry Recent Labs  Lab 05/23/21 1732 05/26/21 0210  NA 139 138  K 4.0 4.0  CL 106 107  CO2 22 23  GLUCOSE 107* 111*  BUN 12 11  CREATININE 1.16* 1.07*  CALCIUM 9.2 8.6*  GFRNONAA 49* 55*  ANIONGAP 11 8    Lipids No results for input(s): CHOL, TRIG, HDL, LABVLDL, LDLCALC, CHOLHDL in the last 168  hours.  Hematology Recent Labs  Lab 05/22/21 1113 05/23/21 1732 05/26/21 0210  WBC 11.3* 12.4* 8.6  RBC 4.92 5.24* 4.45  HGB 13.4 14.2 12.2  HCT 41.5 46.5* 37.9  MCV 84 88.7 85.2  MCH 27.2 27.1 27.4  MCHC 32.3 30.5 32.2  RDW 14.1 15.3 15.6*  PLT 384 361 336   Thyroid No results for input(s): TSH, FREET4 in the last 168 hours.  BNPNo results for input(s): BNP, PROBNP in the last 168 hours.  DDimer No results for input(s): DDIMER in the last 168 hours.   Radiology    No results found.  Cardiac Studies  CATH 11/13/2017  Conclusions: Significant 2-vessel coronary artery disease, including acute plaque rupture and 100% thrombotic occlusion of proximal/mid LAD (type I MI), moderate to severe D1 stenoses, and 90% ostial LCx lesion. Mildly elevated LVEDP. Challenging but successful primary PCI to proximal/mid LAD using Xience [place] 2.75 x 23 mm drug-eluting stent with 0% residual stenosis and TIMI-2 flow due to slow-reflow phenomenon.  Door-to-balloon time was delayed due to discuss regarding patient's multiple allergies/interoleranced (including to antiplatelet therapy), difficult right radial access, and challenging guide catheter engagement. Catheter-induced right radial artery dissection treated  with internal tamponade with guide catheter. Right forearm hematoma from arteriotomy site. Diagnostic Dominance: Right Intervention  Implants     Permanent Stent  Stent [place] 2.75 X 23 Mm - [id number] - Implanted      CATH 01/22/2018  Widely patent stent in mid LAD and stable moderate to severe disease involving branches of D1. Moderate, non-obstructive mid RCA disease. 60% ostial LCx stenosis with minimal luminal area of 5.1 cm^2 by IVUS and FFR 0.85.  CATH 04/26/2019  1. Continued patency of the LAD stent with minimal in-stent restenosis 2. Stable, moderate stenosis of the LCx ostium, mid-diagonal branch, and RCA, all lesions unchanged from previous cath studies 3.  Normal LV function with normal LVEDP  Diagnostic Dominance: Right    Patient Profile     75 y.o. 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 The ostial LCX lesion has been estimated as much less severe compared to the original evaluation (component of vasospasm?). For cath and possible PCI today. This procedure has been fully reviewed with the patient and written informed consent has been obtained. Consider empirical therapy for vasospasm.      For questions or updates, please contact [place] Please consult [email or web address] for contact info under        Signed, Sanda Klein, MD  05/27/2021, 8:22 AM

## 2021-05-27 NOTE — Interval H&P Note (Signed)
History and Physical Interval Note:  05/27/2021 10:04 AM  [name]  has presented today for surgery, with the diagnosis of unstable angina.  The various methods of treatment have been discussed with the patient and family. After consideration of risks, benefits and other options for treatment, the patient has consented to  Procedure(s): LEFT HEART CATH AND CORONARY ANGIOGRAPHY (N/A) as a surgical intervention.  The patient's history has been reviewed, patient examined, no change in status, stable for surgery.  I have reviewed the patient's chart and labs.  Questions were answered to the patient's satisfaction.    Cath Lab Visit (complete for each Cath Lab visit)  Clinical Evaluation Leading to the Procedure:   ACS: Yes.    Non-ACS:  N/A  Donna Bailey

## 2021-05-27 NOTE — Discharge Summary (Signed)
Physician Discharge Summary  Donna Bailey HTD:[id number] DOB: [birth year] DOA: 05/23/2021  PCP: [name], MD  Admit date: 05/23/2021 Discharge date: 05/27/2021  Admitted From: home Discharge disposition: home   Code Status: Full Code   Discharge Diagnosis:   Principal Problem:   Chest pain Active Problems:   Essential hypertension   AKI (acute kidney injury) [facility])    Chief Complaint  Patient presents with   Chest Pain    Brief narrative: Donna Bailey is a 75 y.o. 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 Patient presented to the ED on 10/20 with complaint of chest pain intermittent since September with progressive fatigue. Seen by cardiology Dr. Gasper Sells in the office today prior, started on nitro sublingual and was recommended to come to the ED for any recurrence of chest pain.  In the ED, patient was afebrile, mildly hypertensive with systolic blood pressure 726O, Labs with WBC count 12.4, hemoglobin 14.2, creatinine 1.16,  Troponin normal at 6 and 5 EKG normal sinus rhythm Chest x-ray unremarkable Kept under observation and hospitalist service Cardiology consult called. See below for details  Subjective: Patient was seen and examined this morning.  Lying in bed.  Not in distress.  Was waiting for cardiac cath.   Later this morning, she underwent cardiac cath.  Hospital course: Chest pain History of CAD DES to LAD on 11/13/2017.  -Presented with intermittent chest pain in the setting of prior stents  -Cardiology consult obtained.  Underwent cardiac cath this morning which showed moderate LAD, diagonal, RCA disease.  Question vascular spasm distal to previously placed stent. -Cardizem 120 mg daily was added. -Continue aspirin indefinitely.   ICM -EF 40-45% at the time of MI in Apr 2019, improved to 65%  by May 2019. -On Lasix 20 mg daily as needed at home.  Essential hypertension -At home on Lasix as needed, hydralazine as needed -Cardizem added because of suspected abscess present.  Continue the same.   CKD stage II -Creatinine stable.  Multiple allergies -Patient has 37 different listed allergies.  Not clear if all of them are true.  She states he cannot take anything that is 'corn based'.   Allergies as of 05/27/2021       Reactions   Cephalosporins Itching   Other reaction(s): Other (See Comments) unknown   Crestor [rosuvastatin] Other (See Comments)   Lost all muscle mobility    Doxycycline Diarrhea, Nausea And Vomiting   Other reaction(s): Other (See Comments)   Formoterol Other (See Comments)   Reaction not recalled   Other Anaphylaxis, Itching, Rash, Other (See Comments)   Corn fillers, corn by-products - causes severe itching Polyester-"pins sticking in her skin   Sulfa Antibiotics Anaphylaxis   Sulfonamide Derivatives Anaphylaxis   Ciprofloxacin Rash, Other (See Comments)   Nitrofurantoin Diarrhea, Nausea And Vomiting, Other (See Comments)   Also, "convulsions/constant shaking"- per patient   Penicillins Rash   Underarms (both) Has patient had a PCN reaction causing immediate rash, facial/tongue/throat swelling, SOB or lightheadedness with hypotension: YES Has patient had a PCN reaction causing severe rash involving mucus membranes or skin necrosis: NO Has patient had a PCN reaction that required hospitalization NO Has patient had a PCN reaction occurring within the last 10 years: NO If all of the above answers are "NO", then may proceed with Cephalosporin use. Other reaction(s): Other (See Comments) Underarms (both)  Other Reaction: "red hot skin" Underarms (both) Has patient had a PCN reaction causing immediate rash, facial/tongue/throat swelling, SOB or lightheadedness with hypotension: YES Has patient had a PCN reaction causing severe rash involving mucus  membranes or skin necrosis: NO Has patient had a PCN reaction that required hospitalization NO Has patient had a PCN reaction occurring within the last 10 years: NO If all of the above answers are "NO", then may proceed with Cephalosporin use.   Prednisone Itching   Pt states she cannot take prednisone with corn filler 05/19/2019.   Brilinta [ticagrelor]    headache   Cephalexin Other (See Comments)   Reaction not recalled by the patient   Formoterol Fumarate Other (See Comments)   Reaction not recalled   Gold Sodium Thiosulfate Other (See Comments)   Positive patch test   Gold-containing Drug Products Other (See Comments)   Skin tingles   Levofloxacin In D5w Other (See Comments)   Other Reaction: racing heart Other reaction(s): Other (See Comments) Other Reaction: racing heart   Macrodantin [nitrofurantoin Macrocrystal] Itching   Moxifloxacin Other (See Comments)   Caused hands to "shake"   Neomycin Other (See Comments)   Doesn't remember - allergist said not to use it because it could add more allergies- Positive patch test   Ofloxacin Itching   Praluent [alirocumab]    shaking   Ranexa [ranolazine Er]    itching   Repatha [evolocumab]    shaking   Valsartan Other (See Comments)   Pt reports this med causes her to feel sluggish and she feels heaviness on her shoulders.   Welchol [colesevelam]    Zetia [ezetimibe]    Acetaminophen Rash, Other (See Comments)   Facial rash   Fexofenadine Palpitations, Other (See Comments)   Heart races   Ibuprofen Rash   Latex Rash, Other (See Comments)   Skin gets red   Levofloxacin Palpitations, Other (See Comments)   HEART RACING   Mold Extract [trichophyton Mentagrophyte] Other (See Comments)   Bumps on back, stops up sinuses.   Molds & Smuts Rash, Other (See Comments)   Bumps on back, stops up sinuses.   Nickel Rash   Tape Rash   Trichophyton Rash, Other (See Comments)   Bumps on back, stops up sinuses        Medication  List     TAKE these medications    aspirin 81 MG chewable tablet Chew 1 tablet (81 mg total) by mouth daily.   diltiazem 120 MG 24 hr capsule Commonly known as: Cardizem CD Take 1 capsule (120 mg total) by mouth daily.   esomeprazole 40 MG capsule Commonly known as: NEXIUM TAKE 1 CAPSULE BY MOUTH TWICE A DAY What changed: when to take this   furosemide 20 MG tablet Commonly known as: LASIX Take 1 tablet (20 mg total) by mouth daily as needed for fluid.   Gabapentin 10 % Crea Apply 1-2 application topically at bedtime.   guaiFENesin-codeine 100-10 MG/5ML syrup Commonly known as: ROBITUSSIN AC Take 5 mLs by mouth 3 (three) times daily as needed for cough.   hydrALAZINE 25 MG tablet Commonly known as: APRESOLINE Take 1 tablet (25 mg total) by mouth daily as needed (for BP greater than 017 systolic).   hydrOXYzine 25 MG capsule Commonly known as: VISTARIL Take 50 mg by mouth every 8 (eight) hours as needed for anxiety.   Leqvio 284 MG/1.5ML Sosy injection Generic drug: inclisiran 284 mg. Administered at day one of treatment, then at 3 months  and then every 6 months thereafter.   lidocaine 5 % Commonly known as: LIDODERM Place 0.5 patches onto the skin daily as needed (pain).   MAGNESIUM PO Take 1 tablet by mouth at bedtime.   nitroGLYCERIN 0.4 MG SL tablet Commonly known as: NITROSTAT Place 1 tablet (0.4 mg total) under the tongue every 5 (five) minutes as needed for chest pain.   ondansetron 4 MG tablet Commonly known as: ZOFRAN Take 1 tablet (4 mg total) by mouth every 8 (eight) hours as needed for nausea or vomiting.   triamcinolone ointment 0.1 % Commonly known as: KENALOG Apply 1 application topically 2 (two) times daily as needed (dry/itchy skin).   VITAMIN B-12 IJ Inject 1,000 mg as directed every 30 (thirty) days.   vitamin C 100 MG tablet Take 100 mg by mouth daily.   zolpidem 10 MG tablet Commonly known as: AMBIEN Take 1 tablet (10 mg total) by  mouth at bedtime as needed for sleep.        Discharge Instructions:  Diet Recommendation:  Discharge Diet Orders (From admission, onward)     Start     Ordered   05/24/21 0000  Diet - low sodium heart healthy        05/24/21 1334            @BRDDSCINSTRUCTIONS @  Follow ups:    Follow-up Information     [name], MD Follow up.   Specialty: Family Medicine Contact information: [place] [address] [phone]         Imogene Burn, PA-C Follow up.   Specialty: Cardiology Why: [place] location - a follow-up visit has been arranged for you on Tuesday Jun 11, 2021 at 8:15 AM (Arrive by 8:00 AM). Selinda Eon is one of our cardiology team PAs. Contact information: [address] [place] [address] [phone]         [name], MD Follow up.   Specialty: Family Medicine Contact information: [place] [address] [phone]         Freada Bergeron, MD .   Specialties: Cardiology, Radiology Contact information: [address] [place] [address] [phone]                 Wound care:     Discharge Exam:   Vitals:   05/27/21 1415 05/27/21 1424 05/27/21 1425 05/27/21 1430  BP: (!) 217/74 (!) 228/197 (!) 194/70 (!) 180/71  Pulse: 65  67 71  Resp: 19  13 14   Temp:      TempSrc:      SpO2: 100%  99% 100%  Weight:      Height:        Body mass index is 31.22 kg/m.  General exam: Pleasant, elderly Caucasian female.  Not in distress Skin: No rashes, lesions or ulcers. HEENT: Atraumatic, normocephalic, no obvious bleeding Lungs: Clear to auscultate bilaterally CVS: Regular rate and rhythm, no murmur GI/Abd soft, nontender, nondistended, bowel sound present CNS: Alert, awake, oriented x3 Psychiatry: Mood appropriate Extremities: No pedal edema, no calf tenderness  Time coordinating discharge: 35  minutes   The results of significant diagnostics from this hospitalization (including imaging, microbiology, ancillary and laboratory) are listed below for reference.    Procedures and Diagnostic Studies:   DG Chest 2 View  Result Date: 05/23/2021 CLINICAL DATA:  Chest pain, hypertension EXAM: CHEST - 2 VIEW COMPARISON:  05/02/2021  FINDINGS: Frontal and lateral views of the chest demonstrate an unremarkable cardiac silhouette. No airspace disease, effusion, or pneumothorax. Minimal subsegmental atelectasis at the lung bases. No acute bony abnormality. IMPRESSION: 1. No acute intrathoracic process. Electronically Signed   By: Randa Ngo M.D.   On: 05/23/2021 19:13     Labs:   Basic Metabolic Panel: Recent Labs  Lab 05/23/21 1732 05/26/21 0210  NA 139 138  K 4.0 4.0  CL 106 107  CO2 22 23  GLUCOSE 107* 111*  BUN 12 11  CREATININE 1.16* 1.07*  CALCIUM 9.2 8.6*   GFR Estimated Creatinine Clearance: 51.5 mL/min (A) (by C-G formula based on SCr of 1.07 mg/dL (H)). Liver Function Tests: No results for input(s): AST, ALT, ALKPHOS, BILITOT, PROT, ALBUMIN in the last 168 hours. No results for input(s): LIPASE, AMYLASE in the last 168 hours. No results for input(s): AMMONIA in the last 168 hours. Coagulation profile No results for input(s): INR, PROTIME in the last 168 hours.  CBC: Recent Labs  Lab 05/22/21 1113 05/23/21 1732 05/26/21 0210  WBC 11.3* 12.4* 8.6  NEUTROABS  --   --  4.1  HGB 13.4 14.2 12.2  HCT 41.5 46.5* 37.9  MCV 84 88.7 85.2  PLT 384 361 336   Cardiac Enzymes: No results for input(s): CKTOTAL, CKMB, CKMBINDEX, TROPONINI in the last 168 hours. BNP: Invalid input(s): POCBNP CBG: Recent Labs  Lab 05/24/21 0851 05/24/21 1138 05/24/21 2105  GLUCAP 108* 92 148*   D-Dimer No results for input(s): DDIMER in the last 72 hours. Hgb A1c No results for input(s): HGBA1C in the last 72 hours. Lipid Profile No results for input(s): CHOL, HDL, LDLCALC,  TRIG, CHOLHDL, LDLDIRECT in the last 72 hours. Thyroid function studies No results for input(s): TSH, T4TOTAL, T3FREE, THYROIDAB in the last 72 hours.  Invalid input(s): FREET3 Anemia work up No results for input(s): VITAMINB12, FOLATE, FERRITIN, TIBC, IRON, RETICCTPCT in the last 72 hours. Microbiology Recent Results (from the past 240 hour(s))  Resp Panel by RT-PCR (Flu A&B, Covid) Nasopharyngeal Swab     Status: None   Collection Time: 05/24/21  1:14 AM   Specimen: Nasopharyngeal Swab; Nasopharyngeal(NP) swabs in vial transport medium  Result Value Ref Range Status   SARS Coronavirus 2 by RT PCR NEGATIVE NEGATIVE Final    Comment: (NOTE) SARS-CoV-2 target nucleic acids are NOT DETECTED.  The SARS-CoV-2 RNA is generally detectable in upper respiratory specimens during the acute phase of infection. The lowest concentration of SARS-CoV-2 viral copies this assay can detect is 138 copies/mL. A negative result does not preclude SARS-Cov-2 infection and should not be used as the sole basis for treatment or other patient management decisions. A negative result may occur with  improper specimen collection/handling, submission of specimen other than nasopharyngeal swab, presence of viral mutation(s) within the areas targeted by this assay, and inadequate number of viral copies(<138 copies/mL). A negative result must be combined with clinical observations, patient history, and epidemiological information. The expected result is Negative.  Fact Sheet for Patients:  EntrepreneurPulse.com.au  Fact Sheet for Healthcare Providers:  IncredibleEmployment.be  This test is no t yet approved or cleared by the [place] FDA and  has been authorized for detection and/or diagnosis of SARS-CoV-2 by FDA under an Emergency Use Authorization (EUA). This EUA will remain  in effect (meaning this test can be used) for the duration of the COVID-19 declaration under  Section 564(b)(1) of the Act, 21 U.S.C.section 360bbb-3(b)(1), unless the authorization is terminated  or revoked sooner.       Influenza A by PCR NEGATIVE NEGATIVE Final   Influenza B by PCR NEGATIVE NEGATIVE Final    Comment: (NOTE) The Xpert Xpress SARS-CoV-2/FLU/RSV plus assay is intended as an aid in the diagnosis of influenza from Nasopharyngeal swab specimens and should not be used as a sole basis for treatment. Nasal washings and aspirates are unacceptable for Xpert Xpress SARS-CoV-2/FLU/RSV testing.  Fact Sheet for Patients: EntrepreneurPulse.com.au  Fact Sheet for Healthcare Providers: IncredibleEmployment.be  This test is not yet approved or cleared by the [place] FDA and has been authorized for detection and/or diagnosis of SARS-CoV-2 by FDA under an Emergency Use Authorization (EUA). This EUA will remain in effect (meaning this test can be used) for the duration of the COVID-19 declaration under Section 564(b)(1) of the Act, 21 U.S.C. section 360bbb-3(b)(1), unless the authorization is terminated or revoked.  Performed at [facility], [place] [address] [place], [place] [address]      Signed: Terrilee Croak  [facility] 05/27/2021, 2:38 PM

## 2021-05-27 NOTE — Discharge Instructions (Signed)
Post-Cath Instructions: No driving for 2 days (do not resume driving if you have previously been instructed not to for other reasons). No lifting over 5 lbs for 1 week. No sexual activity for 1 week. eep procedure site clean & dry. If you notice increased pain, swelling, bleeding or pus, call/return!  You may shower, but no soaking baths/hot tubs/pools for 1 week.

## 2021-05-27 NOTE — Plan of Care (Signed)

## 2021-05-27 NOTE — H&P (View-Only) (Signed)
Progress Note  Patient Name: Donna Bailey Date of Encounter: 05/27/2021  [facility] Cardiologist: Freada Bergeron, MD   Subjective   Currently pain free.  Inpatient Medications    Scheduled Meds:  aspirin  81 mg Oral Pre-Cath   [START ON 05/28/2021] aspirin  81 mg Oral Daily   nitroGLYCERIN  0.8 mg Sublingual Once   pantoprazole  40 mg Oral Daily   sodium chloride flush  3 mL Intravenous Q12H   Continuous Infusions:  sodium chloride     sodium chloride     PRN Meds: sodium chloride, hydrALAZINE, nitroGLYCERIN, ondansetron, sodium chloride flush   Vital Signs    Vitals:   05/26/21 0844 05/26/21 1258 05/26/21 2013 05/27/21 0428  BP: 137/69 (!) 125/56 140/69 (!) 161/75  Pulse: 73 70 74 70  Resp: 18 18 18 20   Temp:  98 F (36.7 C) 98.3 F (36.8 C) 98.8 F (37.1 C)  TempSrc:  Oral Oral Oral  SpO2: 94%   96%  Weight:      Height:       No intake or output data in the 24 hours ending 05/27/21 0822 Last 3 Weights 05/24/2021 05/22/2021 05/15/2021  Weight (lbs) 193 lb 6.4 oz 192 lb 6.4 oz 193 lb 6.4 oz  Weight (kg) 87.726 kg 87.272 kg 87.726 kg      Telemetry    NSR, brief PAT ( 7-beats) - Personally Reviewed  ECG    NSR, normal - Personally Reviewed  Physical Exam  Appears comfortable GEN: No acute distress.   Neck: No JVD Cardiac: RRR, no murmurs, rubs, or gallops.  Respiratory: Clear to auscultation bilaterally. GI: Soft, nontender, non-distended  MS: No edema; No deformity. Neuro:  Nonfocal  Psych: Normal affect   Labs    High Sensitivity Troponin:   Recent Labs  Lab 05/23/21 1732 05/23/21 1932  TROPONINIHS 6 5     Chemistry Recent Labs  Lab 05/23/21 1732 05/26/21 0210  NA 139 138  K 4.0 4.0  CL 106 107  CO2 22 23  GLUCOSE 107* 111*  BUN 12 11  CREATININE 1.16* 1.07*  CALCIUM 9.2 8.6*  GFRNONAA 49* 55*  ANIONGAP 11 8    Lipids No results for input(s): CHOL, TRIG, HDL, LABVLDL, LDLCALC, CHOLHDL in the last 168  hours.  Hematology Recent Labs  Lab 05/22/21 1113 05/23/21 1732 05/26/21 0210  WBC 11.3* 12.4* 8.6  RBC 4.92 5.24* 4.45  HGB 13.4 14.2 12.2  HCT 41.5 46.5* 37.9  MCV 84 88.7 85.2  MCH 27.2 27.1 27.4  MCHC 32.3 30.5 32.2  RDW 14.1 15.3 15.6*  PLT 384 361 336   Thyroid No results for input(s): TSH, FREET4 in the last 168 hours.  BNPNo results for input(s): BNP, PROBNP in the last 168 hours.  DDimer No results for input(s): DDIMER in the last 168 hours.   Radiology    No results found.  Cardiac Studies  CATH 11/13/2017  Conclusions: Significant 2-vessel coronary artery disease, including acute plaque rupture and 100% thrombotic occlusion of proximal/mid LAD (type I MI), moderate to severe D1 stenoses, and 90% ostial LCx lesion. Mildly elevated LVEDP. Challenging but successful primary PCI to proximal/mid LAD using Xience [place] 2.75 x 23 mm drug-eluting stent with 0% residual stenosis and TIMI-2 flow due to slow-reflow phenomenon.  Door-to-balloon time was delayed due to discuss regarding patient's multiple allergies/interoleranced (including to antiplatelet therapy), difficult right radial access, and challenging guide catheter engagement. Catheter-induced right radial artery dissection treated  with internal tamponade with guide catheter. Right forearm hematoma from arteriotomy site. Diagnostic Dominance: Right Intervention  Implants     Permanent Stent  Stent [place] 2.75 X 23 Mm - [id number] - Implanted      CATH 01/22/2018  Widely patent stent in mid LAD and stable moderate to severe disease involving branches of D1. Moderate, non-obstructive mid RCA disease. 60% ostial LCx stenosis with minimal luminal area of 5.1 cm^2 by IVUS and FFR 0.85.  CATH 04/26/2019  1. Continued patency of the LAD stent with minimal in-stent restenosis 2. Stable, moderate stenosis of the LCx ostium, mid-diagonal branch, and RCA, all lesions unchanged from previous cath studies 3.  Normal LV function with normal LVEDP  Diagnostic Dominance: Right    Patient Profile     75 y.o. 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 The ostial LCX lesion has been estimated as much less severe compared to the original evaluation (component of vasospasm?). For cath and possible PCI today. This procedure has been fully reviewed with the patient and written informed consent has been obtained. Consider empirical therapy for vasospasm.      For questions or updates, please contact [place] Please consult [email or web address] for contact info under        Signed, Sanda Klein, MD  05/27/2021, 8:22 AM

## 2021-05-27 NOTE — Progress Notes (Addendum)
SITE AREA: right groin/femoral  SITE PRIOR TO REMOVAL:  LEVEL 0  PRESSURE APPLIED FOR: approximately 20 minutes  MANUAL: yes  PATIENT STATUS DURING PULL: stable  POST PULL SITE:  LEVEL  POST PULL INSTRUCTIONS GIVEN: yes  POST PULL PULSES PRESENT: right pedal pulse palpable at +2  DRESSING APPLIED: gauze with tegaderm   BEDREST BEGINS @ 1421  COMMENTS: bruising noted to right groin/femoral area, around insertion site, area to left and above insertion site pinkened and with small amount of clear to pink drainage noted, hive like in appearance, but flat

## 2021-05-28 ENCOUNTER — Encounter (HOSPITAL_COMMUNITY): Payer: Self-pay | Admitting: Internal Medicine

## 2021-05-28 NOTE — Progress Notes (Signed)
Patient discharged from the floor to home.  Patient's vital signs were within normal limits on discharge.  Femoral site on discharge was clean and dry, old blood noted and some bruising at the site. Patient given discharge instructions.  Patient verbalized understanding.

## 2021-05-31 ENCOUNTER — Telehealth: Payer: Self-pay | Admitting: Cardiology

## 2021-05-31 ENCOUNTER — Other Ambulatory Visit (HOSPITAL_BASED_OUTPATIENT_CLINIC_OR_DEPARTMENT_OTHER): Payer: Self-pay

## 2021-05-31 NOTE — Telephone Encounter (Signed)
Pt c/o medication issue:  1. Name of Medication:  diltiazem (CARDIZEM CD) 120 MG 24 hr capsule  2. How are you currently taking this medication (dosage and times per day)?  As prescribed  3. Are you having a reaction (difficulty breathing--STAT)?  Yes   4. What is your medication issue?   Patient states she has a corn allergy and this medication contains corn starch. She states it typically causes her to become itchy around her face and since starting it, she developed itchiness around her face.

## 2021-05-31 NOTE — Telephone Encounter (Signed)
Per last note, patient does not have corn allergy?  "Patient has multiple drug intolerances of notes:  Ranolazine itching, Praluent shaking, Repatha shaking valsatran multiple (has no corn allergy, and has seen an allergist), Statins immobility, Brilinta HA and dizziness, zetia and welchol (unclear).  Corn allergy, but with negative testing (Dr. Scherrie Bateman 2020).  Bleeding issues with ASA."  If patient prefers a different manufacturer, Cardizem CD made by [facility] does not contain corn starch.    [place] numbers: [id number], [id number], [phone], 2010-0712-19.    Inactive ingredients:  FERROSOFERRIC OXIDE  FD&C BLUE NO. 1 MAGNESIUM STEARATE    ETHYL ACRYLATE AND METHYL METHACRYLATE COPOLYMER ( POLYSORBATE 80  POVIDONE K30   SUCROSE STEARATE    TALC  TITANIUM DIOXIDE   GELATIN, UNSPECIFIED  HYPROMELLOSE, UNSPECIFIED   MICROCRYSTALLINE CELLULOSE

## 2021-05-31 NOTE — Telephone Encounter (Signed)
Left message to call back  

## 2021-05-31 NOTE — Telephone Encounter (Signed)
Patient returning call.

## 2021-05-31 NOTE — Telephone Encounter (Signed)
Spoke with patient.  Patient believes many of the inactive ingredients listed in the Cardizem CD product I recommended actually do contain corn.  Gave patient phone number of manufacturer.  Will contact outpatient pharmacy to see if they can order product but unsure if patient will take it.  Patient would like a substitution if she can not be assured that the Cardizem CD does not contain corn.
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
## 2021-06-04 NOTE — Telephone Encounter (Signed)
I do not have any further recommendations, as she has numerous self-reported intolerances.  She will need to weigh the risks and benefits of this medication in the context of her symptoms and decide for herself.  I will defer continued management of her non-obstructive coronary artery disease to her primary cardiologist.  Nelva Bush, MD [place]

## 2021-06-04 NOTE — Telephone Encounter (Signed)
  [facility] can order the Cardizem CD made by [facility].  Advised not to order until patient makes up her mind if she wants to try it again.  Asked if I could please send a message to Dr. Saunders Revel if he has any other recommendations regarding other products she may be able to try.  Advised that the Cardizem CD product made by [facility] does not contain corn, however patient does not believe me.

## 2021-06-11 ENCOUNTER — Encounter: Payer: Self-pay | Admitting: Physician Assistant

## 2021-06-11 ENCOUNTER — Ambulatory Visit (INDEPENDENT_AMBULATORY_CARE_PROVIDER_SITE_OTHER): Payer: Medicare Other | Admitting: Physician Assistant

## 2021-06-11 ENCOUNTER — Other Ambulatory Visit: Payer: Self-pay

## 2021-06-11 VITALS — BP 150/100 | HR 80 | Ht 66.0 in | Wt 187.2 lb

## 2021-06-11 DIAGNOSIS — I10 Essential (primary) hypertension: Secondary | ICD-10-CM | POA: Diagnosis not present

## 2021-06-11 DIAGNOSIS — I255 Ischemic cardiomyopathy: Secondary | ICD-10-CM

## 2021-06-11 DIAGNOSIS — E7849 Other hyperlipidemia: Secondary | ICD-10-CM

## 2021-06-11 DIAGNOSIS — I2510 Atherosclerotic heart disease of native coronary artery without angina pectoris: Secondary | ICD-10-CM | POA: Diagnosis not present

## 2021-06-11 DIAGNOSIS — N182 Chronic kidney disease, stage 2 (mild): Secondary | ICD-10-CM

## 2021-06-11 MED ORDER — CARVEDILOL 3.125 MG PO TABS: 3.1250 mg | ORAL_TABLET | Freq: Two times a day (BID) | ORAL | 3 refills | Status: DC

## 2021-06-11 NOTE — Patient Instructions (Signed)
Medication Instructions:  1.Start carvedilol 3.125 mg, take one tablet by mouth twice a day. *If you need a refill on your cardiac medications before your next appointment, please call your pharmacy*   Lab Work: None If you have labs (blood work) drawn today and your tests are completely normal, you will receive your results only by: [place] (if you have MyChart) OR A paper copy in the mail If you have any lab test that is abnormal or we need to change your treatment, we will call you to review the results.   Follow-Up: At [facility], you and your health needs are our priority.  As part of our continuing mission to provide you with exceptional heart care, we have created designated Provider Care Teams.  These Care Teams include your primary Cardiologist (physician) and Advanced Practice Providers (APPs -  Physician Assistants and Nurse Practitioners) who all work together to provide you with the care you need, when you need it.   Your next appointment:   06/25/21  The format for your next appointment:   In Person  Provider:   Freada Bergeron, MD {
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
## 2021-06-18 ENCOUNTER — Other Ambulatory Visit: Payer: Self-pay | Admitting: Home Modifications

## 2021-06-18 DIAGNOSIS — R197 Diarrhea, unspecified: Secondary | ICD-10-CM

## 2021-06-18 DIAGNOSIS — R1031 Right lower quadrant pain: Secondary | ICD-10-CM

## 2021-06-20 ENCOUNTER — Ambulatory Visit
Admission: RE | Admit: 2021-06-20 | Discharge: 2021-06-20 | Disposition: A | Discharge status: Home / Self Care | Payer: Medicare Other | Source: Ambulatory Visit | Attending: Home Modifications | Admitting: Home Modifications

## 2021-06-20 ENCOUNTER — Other Ambulatory Visit: Payer: Self-pay

## 2021-06-20 DIAGNOSIS — R197 Diarrhea, unspecified: Secondary | ICD-10-CM

## 2021-06-20 DIAGNOSIS — R1031 Right lower quadrant pain: Secondary | ICD-10-CM

## 2021-06-20 MED ORDER — IOPAMIDOL (ISOVUE-300) INJECTION 61%
100.0000 mL | Freq: Once | INTRAVENOUS | Status: AC | PRN
  Administered 2021-06-20: 15:00:00 100 mL via INTRAVENOUS

## 2021-06-25 ENCOUNTER — Encounter: Payer: Self-pay | Admitting: Cardiology

## 2021-06-25 ENCOUNTER — Ambulatory Visit (INDEPENDENT_AMBULATORY_CARE_PROVIDER_SITE_OTHER): Payer: Medicare Other | Admitting: Cardiology

## 2021-06-25 ENCOUNTER — Other Ambulatory Visit: Payer: Self-pay

## 2021-06-25 VITALS — BP 170/98 | HR 83 | Ht 66.0 in | Wt 192.8 lb

## 2021-06-25 DIAGNOSIS — I10 Essential (primary) hypertension: Secondary | ICD-10-CM

## 2021-06-25 DIAGNOSIS — I255 Ischemic cardiomyopathy: Secondary | ICD-10-CM

## 2021-06-25 DIAGNOSIS — E7800 Pure hypercholesterolemia, unspecified: Secondary | ICD-10-CM

## 2021-06-25 DIAGNOSIS — R011 Cardiac murmur, unspecified: Secondary | ICD-10-CM

## 2021-06-25 DIAGNOSIS — I2510 Atherosclerotic heart disease of native coronary artery without angina pectoris: Secondary | ICD-10-CM | POA: Diagnosis not present
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
## 2021-06-25 NOTE — Patient Instructions (Signed)
Medication Instructions:   Your physician recommends that you continue on your current medications as directed. Please refer to the Current Medication list given to you today.  *If you need a refill on your cardiac medications before your next appointment, please call your pharmacy*   You have been referred to [facility]--SCHEDULE AT FIRST AVAILABLE    Testing/Procedures:  Your physician has requested that you have an echocardiogram. Echocardiography is a painless test that uses sound waves to create images of your heart. It provides your doctor with information about the size and shape of your heart and how well your heart's chambers and valves are working. This procedure takes approximately one hour. There are no restrictions for this procedure.    Follow-Up:  3 MONTHS IN THE OFFICE WITH SCOTT WEAVER PA-C

## 2021-07-15 ENCOUNTER — Ambulatory Visit (INDEPENDENT_AMBULATORY_CARE_PROVIDER_SITE_OTHER): Payer: Medicare Other | Admitting: Nurse Practitioner

## 2021-07-15 ENCOUNTER — Encounter: Payer: Self-pay | Admitting: Nurse Practitioner

## 2021-07-15 ENCOUNTER — Other Ambulatory Visit (INDEPENDENT_AMBULATORY_CARE_PROVIDER_SITE_OTHER): Payer: Medicare Other

## 2021-07-15 VITALS — BP 116/80 | HR 72 | Ht 66.0 in | Wt 189.2 lb

## 2021-07-15 DIAGNOSIS — A048 Other specified bacterial intestinal infections: Secondary | ICD-10-CM

## 2021-07-15 DIAGNOSIS — K8689 Other specified diseases of pancreas: Secondary | ICD-10-CM | POA: Diagnosis not present

## 2021-07-15 DIAGNOSIS — R197 Diarrhea, unspecified: Secondary | ICD-10-CM

## 2021-07-15 DIAGNOSIS — K909 Intestinal malabsorption, unspecified: Secondary | ICD-10-CM

## 2021-07-15 LAB — CBC WITH DIFFERENTIAL/PLATELET
Basophils Absolute: 0 10*3/uL (ref 0.0–0.1)
Basophils Relative: 0.5 % (ref 0.0–3.0)
Eosinophils Absolute: 0.2 10*3/uL (ref 0.0–0.7)
Eosinophils Relative: 2.2 % (ref 0.0–5.0)
HCT: 38.4 % (ref 36.0–46.0)
Hemoglobin: 12.3 g/dL (ref 12.0–15.0)
Lymphocytes Relative: 18.9 % (ref 12.0–46.0)
Lymphs Abs: 1.9 10*3/uL (ref 0.7–4.0)
MCHC: 32 g/dL (ref 30.0–36.0)
MCV: 84 fl (ref 78.0–100.0)
Monocytes Absolute: 1 10*3/uL (ref 0.1–1.0)
Monocytes Relative: 10.2 % (ref 3.0–12.0)
Neutro Abs: 6.8 10*3/uL (ref 1.4–7.7)
Neutrophils Relative %: 68.2 % (ref 43.0–77.0)
Platelets: 206 10*3/uL (ref 150.0–400.0)
RBC: 4.57 Mil/uL (ref 3.87–5.11)
RDW: 19.4 % — ABNORMAL HIGH (ref 11.5–15.5)
WBC: 9.9 10*3/uL (ref 4.0–10.5)

## 2021-07-15 LAB — COMPREHENSIVE METABOLIC PANEL
ALT: 14 U/L (ref 0–35)
AST: 11 U/L (ref 0–37)
Albumin: 3.7 g/dL (ref 3.5–5.2)
Alkaline Phosphatase: 59 U/L (ref 39–117)
BUN: 21 mg/dL (ref 6–23)
CO2: 27 mEq/L (ref 19–32)
Calcium: 9.1 mg/dL (ref 8.4–10.5)
Chloride: 105 mEq/L (ref 96–112)
Creatinine, Ser: 0.85 mg/dL (ref 0.40–1.20)
GFR: 67.42 mL/min (ref >60.00)
Glucose, Bld: 83 mg/dL (ref 70–99)
Potassium: 3.9 mEq/L (ref 3.5–5.1)
Sodium: 140 mEq/L (ref 135–145)
Total Bilirubin: 0.4 mg/dL (ref 0.2–1.2)
Total Protein: 6.5 g/dL (ref 6.0–8.3)

## 2021-07-15 LAB — LIPASE: Lipase: 13 U/L (ref 11.0–59.0)

## 2021-07-15 NOTE — Progress Notes (Signed)
Noted  
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
## 2021-07-15 NOTE — Patient Instructions (Addendum)
If you are age 74 or older, your body mass index should be between 23-30. Your Body mass index is 30.55 kg/m. If this is out of the aforementioned range listed, please consider follow up with your Primary Care Provider.  The  GI providers would like to encourage you to use [facility] to communicate with providers for non-urgent requests or questions.  Due to long hold times on the telephone, sending your provider a message by [facility] may be faster and more efficient way to get a response. Please allow 48 business hours for a response.  Please remember that this is for non-urgent requests/questions.  LABS:  Lab work has been ordered for you today. Our lab is located in the basement. Press "B" on the elevator. The lab is located at the first door on the left as you exit the elevator.  HEALTHCARE LAWS AND MY CHART RESULTS: Due to recent changes in healthcare laws, you may see the results of your imaging and laboratory studies on MyChart before your provider has had a chance to review them.   We understand that in some cases there may be results that are confusing or concerning to you. Not all laboratory results come back in the same time frame and the provider may be waiting for multiple results in order to interpret others.  Please give [place] 48 hours in order for your provider to thoroughly review all the results before contacting the office for clarification of your results.   RECOMMENDATIONS: Maintain a heart healthy diet. Further recommendations to be determined after lab results reviewed.  It was great seeing you today! Thank you for entrusting me with your care and choosing [facility].  Noralyn Pick, CRNP

## 2021-07-17 ENCOUNTER — Ambulatory Visit (INDEPENDENT_AMBULATORY_CARE_PROVIDER_SITE_OTHER): Payer: Medicare Other | Admitting: Pharmacist

## 2021-07-17 ENCOUNTER — Ambulatory Visit (HOSPITAL_COMMUNITY): Payer: Medicare Other | Attending: Cardiology

## 2021-07-17 ENCOUNTER — Other Ambulatory Visit: Payer: Self-pay

## 2021-07-17 VITALS — BP 174/98 | HR 64

## 2021-07-17 DIAGNOSIS — R011 Cardiac murmur, unspecified: Secondary | ICD-10-CM

## 2021-07-17 DIAGNOSIS — I10 Essential (primary) hypertension: Secondary | ICD-10-CM | POA: Diagnosis not present

## 2021-07-17 LAB — ECHOCARDIOGRAM COMPLETE
Area-P 1/2: 3 cm2
S' Lateral: 2.8 cm

## 2021-07-17 MED ORDER — AMLODIPINE BESYLATE 2.5 MG PO TABS: 2.5000 mg | ORAL_TABLET | Freq: Every day | ORAL | 11 refills | Status: DC

## 2021-07-17 NOTE — Patient Instructions (Addendum)
It was nice to see you today  Your blood pressure goal is <130/29mmHg  Start taking amlodipine 2.5mg  once daily  Monitor your blood pressure and bring your blood pressure cuff and readings to your next visit in January

## 2021-07-17 NOTE — Progress Notes (Addendum)
Patient ID: Donna Bailey                 DOB: 1946-01-06                      MRN: [id number]     HPI: Donna Bailey is a 75 y.o. female referred by Dr. Shari Prows to HTN clinic. PMH is significant for STEMI s/p LAD PCI in 11/13/2017 with cath complicated by radial artery dissection and hematoma, ischemic CM with recovered EF, HTN, fibromyalgia, anxiety, and multiple medication intolerances. Seen in the ED on 05/23/21 for unstable angina. Cath 05/27/21 demonstrated patent stents with minimal in-stent restenosis with ? Vasospasm vs bridging distal to stent. Recommended continued medical management. Last seen by Dr Shari Prows 06/25/21. BP was elevated at 170/98. Given extensive med intolerances, pt was referred to PharmD.  Pt presents today for follow up. No longer taking carvedilol - med list updated. Reports BP was elevated yesterday with systolic up to 171. She also had some chest pain. Took a NTG and her systolic dropped to 130. Was elevated again this AM. Overall reports lower BP readings, one time saw 80/50. Previously reported corn allergy but testing was negative with Dr Murrell Converse in 2020, most likely has non-IgE mediated intolerances. Pt still reports issues tolerating meds containing corn.   Eats a low sodium diet, no caffeine or NSAIDs. Has been taking beet chews, read they can increase nitric oxide. Mentions wanting to start on Leqvio.  Current HTN meds: hydralazine 25mg  - prn SBP > 160, furosemide 20mg  - prn fluid  Previously tried:  Carvedilol - dry mouth Diltiazem - LE edema, contains corn filler Hydralazine - doesn't recall Valsartan - felt sluggish Ranexa - itching Imdur - doesn't recall Felodipine - sensitive to medications  BP goal: <130/35mmHg  Family History: Breast cancer in her sister; Cancer in her sister; Cancer (age of onset: 29) in her sister; Colitis in her mother; Colon polyps in her brother; Diverticulosis in her mother; Heart disease in her father and mother;  Leukemia in her sister; Tuberculosis in her brother.  Social History: Denies tobacco, alcohol and drug use.  Drug allergies: Cephalosporins, Crestor [rosuvastatin], Doxycycline, Formoterol, Other, Sulfa antibiotics, Sulfonamide derivatives, Ciprofloxacin, Nitrofurantoin, Penicillins, Prednisone, Brilinta [ticagrelor], Carvedilol, Cephalexin, Dilt-xr [diltiazem hcl], Formoterol fumarate, Fosfomycin, Gold sodium thiosulfate, Gold-containing drug products, Levofloxacin in d5w, Macrodantin [nitrofurantoin macrocrystal], Moxifloxacin, Neomycin, Ofloxacin, Praluent [alirocumab], Ranexa [ranolazine er], Repatha [evolocumab], Valsartan, Welchol [colesevelam], Zetia [ezetimibe], Acetaminophen, Fexofenadine, Ibuprofen, Latex, Levofloxacin, Mold extract [trichophyton mentagrophyte], Molds & smuts, Nickel, Tape, and Trichophyton   Wt Readings from Last 3 Encounters:  07/15/21 189 lb 4 oz (85.8 kg)  06/25/21 192 lb 12.8 oz (87.5 kg)  06/11/21 187 lb 3.2 oz (84.9 kg)   BP Readings from Last 3 Encounters:  07/15/21 116/80  06/25/21 (!) 170/98  06/11/21 (!) 150/100   Pulse Readings from Last 3 Encounters:  07/15/21 72  06/25/21 83  06/11/21 80    Renal function: Estimated Creatinine Clearance: 64.1 mL/min (by C-G formula based on SCr of 0.85 mg/dL).  Past Medical History:  Diagnosis Date   Allergic rhinitis, cause unspecified    Anemia    Aneurysm of right conjunctiva    right eye    Anxiety    Asthma    Barrett's esophagus    CAD in native artery    a. 11/2017: STEMI s/p DES to prox-mid LAD; LCx stenosis managed medically. Case complicated by R forearm hematoma. b. Several subsequent caths,  last in 04/2019 with stable disease // Myoview 11/2019: EF 61, normal perfusion; low risk    CKD (chronic kidney disease), stage II    Constipation    Cystocele    Deviated nasal septum    Diaphragmatic hernia without mention of obstruction or gangrene    Esophageal reflux    Fibromyalgia    H/O hiatal  hernia    Hypertension    Insomnia, unspecified    Ischemic cardiomyopathy    a. EF 40-45% by echo 11/2017. // Echo 5/19: No new wall motion abnormalities, EF 65, no pericardial effusion, normal aortic root   Kidney stones    hx of pt see Dr. Isabel Caprice   Medication intolerance    numerous   Migraine    Myalgia and myositis, unspecified    Myocardial infarct (HCC) 11/13/2017   Myocardial infarction [facility])    Nuclear stress tests    Cardiolite 2/18: no ischemia or scar, EF 78; Low Risk   Osteoarthrosis, unspecified whether generalized or localized, unspecified site    Peripheral neuropathy    Pneumonia    hx of   Pure hypercholesterolemia    Rectocele    Scoliosis (and kyphoscoliosis), idiopathic    Statin intolerance    Temporomandibular joint disorders, unspecified    Urticaria    Wheat allergy     Current Outpatient Medications on File Prior to Visit  Medication Sig Dispense Refill   Ascorbic Acid (VITAMIN C) 100 MG tablet Take 100 mg by mouth daily.     aspirin 81 MG chewable tablet Chew 1 tablet (81 mg total) by mouth daily. 30 tablet 2   carvedilol (COREG) 3.125 MG tablet Take 1 tablet (3.125 mg total) by mouth 2 (two) times daily. 180 tablet 3   Cyanocobalamin (VITAMIN B-12 IJ) Inject 1,000 mg as directed every 30 (thirty) days.     esomeprazole (NEXIUM) 40 MG capsule TAKE 1 CAPSULE BY MOUTH TWICE A DAY 60 capsule 2   furosemide (LASIX) 20 MG tablet Take 1 tablet (20 mg total) by mouth daily as needed for fluid. 90 tablet 3   Gabapentin 10 % CREA Apply 1-2 application topically at bedtime.     guaiFENesin-codeine (ROBITUSSIN AC) 100-10 MG/5ML syrup Take 5 mLs by mouth 3 (three) times daily as needed for cough.     hydrALAZINE (APRESOLINE) 25 MG tablet Take 1 tablet (25 mg total) by mouth daily as needed (for BP greater than 160 systolic). 30 tablet 3   hydrOXYzine (VISTARIL) 25 MG capsule Take 50 mg by mouth every 8 (eight) hours as needed for anxiety.      inclisiran (LEQVIO)  284 MG/1.5ML SOSY injection 284 mg. Administered at day one of treatment, then at 3 months and then every 6 months thereafter.     lidocaine (LIDODERM) 5 % Place 0.5 patches onto the skin daily as needed (pain).     MAGNESIUM PO Take 1 tablet by mouth at bedtime.     nitroGLYCERIN (NITROSTAT) 0.4 MG SL tablet Place 1 tablet (0.4 mg total) under the tongue every 5 (five) minutes as needed for chest pain. 25 tablet 2   ondansetron (ZOFRAN) 4 MG tablet Take 1 tablet (4 mg total) by mouth every 8 (eight) hours as needed for nausea or vomiting. 60 tablet 1   triamcinolone ointment (KENALOG) 0.1 % Apply 1 application topically 2 (two) times daily as needed (dry/itchy skin).     zolpidem (AMBIEN) 10 MG tablet Take 1 tablet (10 mg total) by mouth at  bedtime as needed for sleep. 30 tablet 5   No current facility-administered medications on file prior to visit.    Allergies  Allergen Reactions   Cephalosporins Itching    Other reaction(s): Other (See Comments) unknown   Crestor [Rosuvastatin] Other (See Comments)    Lost all muscle mobility    Doxycycline Diarrhea and Nausea And Vomiting    Other reaction(s): Other (See Comments)   Formoterol Other (See Comments)    Reaction not recalled   Other Anaphylaxis, Itching, Rash and Other (See Comments)    Corn fillers, corn by-products - causes severe itching Polyester-"pins sticking in her skin    Sulfa Antibiotics Anaphylaxis   Sulfonamide Derivatives Anaphylaxis   Ciprofloxacin Rash and Other (See Comments)   Nitrofurantoin Diarrhea, Nausea And Vomiting and Other (See Comments)    Also, "convulsions/constant shaking"- per patient   Penicillins Rash    Underarms (both) Has patient had a PCN reaction causing immediate rash, facial/tongue/throat swelling, SOB or lightheadedness with hypotension: YES Has patient had a PCN reaction causing severe rash involving mucus membranes or skin necrosis: NO Has patient had a PCN reaction that required  hospitalization NO Has patient had a PCN reaction occurring within the last 10 years: NO If all of the above answers are "NO", then may proceed with Cephalosporin use. Other reaction(s): Other (See Comments) Underarms (both) Other Reaction: "red hot skin" Underarms (both) Has patient had a PCN reaction causing immediate rash, facial/tongue/throat swelling, SOB or lightheadedness with hypotension: YES Has patient had a PCN reaction causing severe rash involving mucus membranes or skin necrosis: NO Has patient had a PCN reaction that required hospitalization NO Has patient had a PCN reaction occurring within the last 10 years: NO If all of the above answers are "NO", then may proceed with Cephalosporin use.   Prednisone Itching    Pt states she cannot take prednisone with corn filler 05/19/2019.   Brilinta [Ticagrelor]     headache   Carvedilol     dry mouth   Cephalexin Other (See Comments)    Reaction not recalled by the patient   Dilt-Xr [Diltiazem Hcl]    Formoterol Fumarate Other (See Comments)    Reaction not recalled   Fosfomycin    Gold Sodium Thiosulfate Other (See Comments)    Positive patch test   Gold-Containing Drug Products Other (See Comments)    Skin tingles   Levofloxacin In D5w Other (See Comments)    Other Reaction: racing heart Other reaction(s): Other (See Comments) Other Reaction: racing heart   Macrodantin [Nitrofurantoin Macrocrystal] Itching   Moxifloxacin Other (See Comments)    Caused hands to "shake"   Neomycin Other (See Comments)    Doesn't remember - allergist said not to use it because it could add more allergies- Positive patch test    Ofloxacin Itching   Praluent [Alirocumab]     shaking   Ranexa [Ranolazine Er]     itching   Repatha [Evolocumab]     shaking   Valsartan Other (See Comments)    Pt reports this med causes her to feel sluggish and she feels heaviness on her shoulders.   Welchol [Colesevelam]    Zetia [Ezetimibe]     Acetaminophen Rash and Other (See Comments)    Facial rash   Fexofenadine Palpitations and Other (See Comments)    Heart races   Ibuprofen Rash   Latex Rash and Other (See Comments)    Skin gets red    Levofloxacin Palpitations and Other (  See Comments)    HEART RACING   Mold Extract [Trichophyton Mentagrophyte] Other (See Comments)    Bumps on back, stops up sinuses.   Molds & Smuts Rash and Other (See Comments)    Bumps on back, stops up sinuses.   Nickel Rash   Tape Rash   Trichophyton Rash and Other (See Comments)    Bumps on back, stops up sinuses     Assessment/Plan:  1. Hypertension - BP elevated above goal <130/81mmHg. Will start low dose amlodipine 2.5mg  daily for BP and to help with angina (intolerant to Ranexa and Imdur but doesn't recall side effect with Imdur). Previously took this and tolerated well, stopped in Oct ? low BP. Does not contain corn filler. She will monitor BP at home and bring in home readings and cuff to her next visit in 3 weeks.  Reached out to [place], pt will need to fill out pt assistance application for Leqvio. Will have her fill this out at next appt.  Dovey Fatzinger E. Armonee Bojanowski, PharmD, BCACP, CPP [facility] [address], [place], [place] [address] Phone: [phone]; Fax: [phone] 07/17/2021 10:24 AM

## 2021-07-19 ENCOUNTER — Other Ambulatory Visit: Payer: Self-pay | Admitting: Internal Medicine

## 2021-07-21 LAB — PANCREATIC ELASTASE, FECAL: Pancreatic Elastase-1, Stool: >500 mcg/g

## 2021-07-21 LAB — CLOSTRIDIUM DIFFICILE TOXIN B, QUALITATIVE, REAL-TIME PCR: Toxigenic C. Difficile by PCR: NOT DETECTED

## 2021-07-22 ENCOUNTER — Ambulatory Visit: Payer: Medicare Other

## 2021-07-22 ENCOUNTER — Other Ambulatory Visit: Payer: Self-pay

## 2021-07-22 ENCOUNTER — Encounter (HOSPITAL_COMMUNITY): Payer: Self-pay

## 2021-07-22 ENCOUNTER — Emergency Department (HOSPITAL_COMMUNITY)
Admission: EM | Admit: 2021-07-22 | Discharge: 2021-07-22 | Disposition: A | Discharge status: Home / Self Care | Payer: Medicare Other | Attending: Emergency Medicine | Admitting: Emergency Medicine

## 2021-07-22 DIAGNOSIS — I2510 Atherosclerotic heart disease of native coronary artery without angina pectoris: Secondary | ICD-10-CM | POA: Insufficient documentation

## 2021-07-22 DIAGNOSIS — J45909 Unspecified asthma, uncomplicated: Secondary | ICD-10-CM | POA: Diagnosis not present

## 2021-07-22 DIAGNOSIS — S90821A Blister (nonthermal), right foot, initial encounter: Secondary | ICD-10-CM | POA: Insufficient documentation

## 2021-07-22 DIAGNOSIS — Z91040 Latex allergy status: Secondary | ICD-10-CM | POA: Diagnosis not present

## 2021-07-22 DIAGNOSIS — Z7982 Long term (current) use of aspirin: Secondary | ICD-10-CM | POA: Insufficient documentation

## 2021-07-22 DIAGNOSIS — N182 Chronic kidney disease, stage 2 (mild): Secondary | ICD-10-CM | POA: Insufficient documentation

## 2021-07-22 DIAGNOSIS — I129 Hypertensive chronic kidney disease with stage 1 through stage 4 chronic kidney disease, or unspecified chronic kidney disease: Secondary | ICD-10-CM | POA: Diagnosis not present

## 2021-07-22 DIAGNOSIS — Z79899 Other long term (current) drug therapy: Secondary | ICD-10-CM | POA: Insufficient documentation

## 2021-07-22 DIAGNOSIS — X58XXXA Exposure to other specified factors, initial encounter: Secondary | ICD-10-CM | POA: Diagnosis not present

## 2021-07-22 MED ORDER — KETOROLAC TROMETHAMINE 30 MG/ML IJ SOLN
30.0000 mg | Freq: Once | INTRAMUSCULAR | Status: DC
  Filled 2021-07-22: qty 1

## 2021-07-22 MED ORDER — KETOROLAC TROMETHAMINE 30 MG/ML IJ SOLN
30.0000 mg | Freq: Once | INTRAMUSCULAR | Status: AC
  Administered 2021-07-22: 16:00:00 30 mg via INTRAMUSCULAR

## 2021-07-22 MED ORDER — CLINDAMYCIN HCL 150 MG PO CAPS: 150.0000 mg | ORAL_CAPSULE | Freq: Four times a day (QID) | ORAL | 0 refills | Status: AC
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
## 2021-07-22 NOTE — ED Triage Notes (Signed)
Pt reports blood blister to right foot for about 10 days that has not gotten better and at times radiates pain up her leg.

## 2021-07-22 NOTE — Discharge Instructions (Addendum)
Soak area 20 minutes 3 times a day.

## 2021-08-08 ENCOUNTER — Other Ambulatory Visit: Payer: Self-pay

## 2021-08-08 ENCOUNTER — Ambulatory Visit (INDEPENDENT_AMBULATORY_CARE_PROVIDER_SITE_OTHER): Payer: Medicare Other | Admitting: Pharmacist

## 2021-08-08 VITALS — BP 144/84 | HR 78

## 2021-08-08 DIAGNOSIS — E785 Hyperlipidemia, unspecified: Secondary | ICD-10-CM | POA: Diagnosis not present

## 2021-08-08 DIAGNOSIS — I10 Essential (primary) hypertension: Secondary | ICD-10-CM

## 2021-08-08 MED ORDER — FUROSEMIDE 20 MG PO TABS: ORAL_TABLET | ORAL | 3 refills | Status: DC

## 2021-08-08 NOTE — Progress Notes (Signed)
Patient ID: Donna Bailey                 DOB: 1946/06/01                      MRN: [id number]     HPI: Donna Bailey is a 76 y.o. female referred by Dr. Johney Frame to HTN clinic. PMH is significant for STEMI s/p LAD PCI in 36/64/4034 with cath complicated by radial artery dissection and hematoma, ischemic CM with recovered EF, HTN, fibromyalgia, anxiety, and multiple medication intolerances. Seen in the ED on 05/23/21 for unstable angina. Cath 05/27/21 demonstrated patent stents with minimal in-stent restenosis with ? vasospasm vs bridging distal to stent. Recommended continued medical management. Last seen by Dr Johney Frame 06/25/21. BP was elevated at 170/98. Given extensive med intolerances, pt was referred to PharmD.  Previously reported corn allergy but testing was negative with Dr Scherrie Bateman in 2020, most likely has non-IgE mediated intolerances. Pt still reports issues tolerating meds containing corn. Eats a low sodium diet, no caffeine or NSAIDs. Has been taking beet chews, read they can increase nitric oxide.   At most recent visit with me on 12/14, pt was started on low dose amlodipine 2.5mg  daily for BP and angina (intolerant to Ranexa and Imdur but doesn't recall side effect with Imdur). Previously took amlodipine and tolerated well, stopped in Oct ? low BP. Does not contain corn filler.   At today's visit, pt is feeling "pitiful." She reports swelling in her legs and notably two recent falls on the 2 nonconsecutive days that she took her amlodipine. These led to minor injuries on hand, arm, and leg. She did not hit her head with any falls. Reports third fall this AM as well. She only remembers that the blood pressure after the second fall on 12/23 was high. She stopped taking amlodipine and has not been taking it for a couple of weeks now. Endorses vertigo with head turning. Denies SOB, HA. Report only one episode of chest pain (BP ~180/112) which was relieved with NTG. Pt additionally  reports weight gain with amlodipine- 193 lbs up to 200 lbs. After stopping amlodipine, taking additional Lasix doses, and changing her diet, weight decreased back to 191. Pt has started non-specific changes in diet and does not add salt to diet. PT reports seeing BP readings once-twice a week with SBP <100, but denies consistent hypotension. Will also see systolic readings in 742-595 more regularly. Has not taken any prn hydralazine, prefers to take NTG when BP is high but she also has chest pain when this occurs typically.  Pt would like to move forward initiating Leqvio and was given forms for patient assistance program.   Current HTN meds: hydralazine 25mg  - prn SBP > 160, furosemide 20mg  - prn fluid  Previously tried: Carvedilol - dry mouth Diltiazem - LE edema, contains corn filler Hydralazine - doesn't recall Valsartan - felt sluggish Ranexa - itching Imdur - doesn't recall Felodipine - sensitive to medications Amlodipine - LE edema  BP goal: <140/21mmHg  Family History: Breast cancer in her sister; Cancer in her sister; Cancer (age of onset: 46) in her sister; Colitis in her mother; Colon polyps in her brother; Diverticulosis in her mother; Heart disease in her father and mother; Leukemia in her sister; Tuberculosis in her brother.  Social History: Denies tobacco, alcohol and drug use.  Drug allergies: Cephalosporins, Crestor [rosuvastatin], Doxycycline, Formoterol, Other, Sulfa antibiotics, Sulfonamide derivatives, Ciprofloxacin, Nitrofurantoin, Penicillins, Prednisone, Brilinta [ticagrelor],  Carvedilol, Cephalexin, Dilt-xr Deniece Ree hcl], Formoterol fumarate, Fosfomycin, Gold sodium thiosulfate, Gold-containing drug products, Levofloxacin in d5w, Macrodantin [nitrofurantoin macrocrystal], Moxifloxacin, Neomycin, Ofloxacin, Praluent [alirocumab], Ranexa [ranolazine er], Repatha [evolocumab], Valsartan, Welchol [colesevelam], Zetia [ezetimibe], Acetaminophen, Fexofenadine, Ibuprofen,  Latex, Levofloxacin, Mold extract [trichophyton mentagrophyte], Molds & smuts, Nickel, Tape, and Trichophyton   Wt Readings from Last 3 Encounters:  07/15/21 189 lb 4 oz (85.8 kg)  06/25/21 192 lb 12.8 oz (87.5 kg)  06/11/21 187 lb 3.2 oz (84.9 kg)   BP Readings from Last 3 Encounters:  07/22/21 (!) 188/66  07/17/21 (!) 174/98  07/15/21 116/80   Pulse Readings from Last 3 Encounters:  07/22/21 76  07/17/21 64  07/15/21 72    Renal function: CrCl cannot be calculated (Patient's most recent lab result is older than the maximum 21 days allowed.).  Past Medical History:  Diagnosis Date   Allergic rhinitis, cause unspecified    Anemia    Aneurysm of right conjunctiva    right eye    Anxiety    Asthma    Barrett's esophagus    CAD in native artery    a. 11/2017: STEMI s/p DES to prox-mid LAD; LCx stenosis managed medically. Case complicated by R forearm hematoma. b. Several subsequent caths, last in 04/2019 with stable disease // Myoview 11/2019: EF 61, normal perfusion; low risk    CKD (chronic kidney disease), stage II    Constipation    Cystocele    Deviated nasal septum    Diaphragmatic hernia without mention of obstruction or gangrene    Esophageal reflux    Fibromyalgia    H/O hiatal hernia    Hypertension    Insomnia, unspecified    Ischemic cardiomyopathy    a. EF 40-45% by echo 11/2017. // Echo 5/19: No new wall motion abnormalities, EF 65, no pericardial effusion, normal aortic root   Kidney stones    hx of pt see Dr. Risa Grill   Medication intolerance    numerous   Migraine    Myalgia and myositis, unspecified    Myocardial infarct ([place]) 11/13/2017   Myocardial infarction [facility])    Nuclear stress tests    Cardiolite 2/18: no ischemia or scar, EF 78; Low Risk   Osteoarthrosis, unspecified whether generalized or localized, unspecified site    Peripheral neuropathy    Pneumonia    hx of   Pure hypercholesterolemia    Rectocele    Scoliosis (and kyphoscoliosis),  idiopathic    Statin intolerance    Temporomandibular joint disorders, unspecified    Urticaria    Wheat allergy     Current Outpatient Medications on File Prior to Visit  Medication Sig Dispense Refill   amLODipine (NORVASC) 2.5 MG tablet Take 1 tablet (2.5 mg total) by mouth daily. 30 tablet 11   Ascorbic Acid (VITAMIN C) 100 MG tablet Take 100 mg by mouth daily.     aspirin 81 MG chewable tablet Chew 1 tablet (81 mg total) by mouth daily. 30 tablet 2   Cyanocobalamin (VITAMIN B-12 IJ) Inject 1,000 mg as directed every 30 (thirty) days.     esomeprazole (NEXIUM) 40 MG capsule TAKE 1 CAPSULE BY MOUTH TWICE A DAY 60 capsule 2   furosemide (LASIX) 20 MG tablet Take 1 tablet (20 mg total) by mouth daily as needed for fluid. 90 tablet 3   Gabapentin 10 % CREA Apply 1-2 application topically at bedtime.     guaiFENesin-codeine (ROBITUSSIN AC) 100-10 MG/5ML syrup Take 5 mLs by mouth 3 (  three) times daily as needed for cough.     hydrALAZINE (APRESOLINE) 25 MG tablet Take 1 tablet (25 mg total) by mouth daily as needed (for BP greater than 253 systolic). 30 tablet 3   hydrOXYzine (VISTARIL) 25 MG capsule Take 50 mg by mouth every 8 (eight) hours as needed for anxiety.      inclisiran (LEQVIO) 284 MG/1.5ML SOSY injection 284 mg. Administered at day one of treatment, then at 3 months and then every 6 months thereafter.     lidocaine (LIDODERM) 5 % Place 0.5 patches onto the skin daily as needed (pain).     MAGNESIUM PO Take 1 tablet by mouth at bedtime.     nitroGLYCERIN (NITROSTAT) 0.4 MG SL tablet Place 1 tablet (0.4 mg total) under the tongue every 5 (five) minutes as needed for chest pain. 25 tablet 2   ondansetron (ZOFRAN) 4 MG tablet Take 1 tablet (4 mg total) by mouth every 8 (eight) hours as needed for nausea or vomiting. 60 tablet 1   triamcinolone ointment (KENALOG) 0.1 % Apply 1 application topically 2 (two) times daily as needed (dry/itchy skin).     zolpidem (AMBIEN) 10 MG tablet Take 1  tablet (10 mg total) by mouth at bedtime as needed for sleep. 30 tablet 5   No current facility-administered medications on file prior to visit.    Allergies  Allergen Reactions   Cephalosporins Itching    Other reaction(s): Other (See Comments) Unknown Tolerated cefdinir in 2019   Crestor [Rosuvastatin] Other (See Comments)    Lost all muscle mobility    Doxycycline Diarrhea and Nausea And Vomiting    Other reaction(s): Other (See Comments)   Formoterol Other (See Comments)    Reaction not recalled   Other Anaphylaxis, Itching, Rash and Other (See Comments)    Corn fillers, corn by-products - causes severe itching Polyester-"pins sticking in her skin    Sulfa Antibiotics Anaphylaxis   Sulfonamide Derivatives Anaphylaxis   Ciprofloxacin Rash and Other (See Comments)   Nitrofurantoin Diarrhea, Nausea And Vomiting and Other (See Comments)    Also, "convulsions/constant shaking"- per patient   Penicillins Rash    Underarms (both) Has patient had a PCN reaction causing immediate rash, facial/tongue/throat swelling, SOB or lightheadedness with hypotension: YES Has patient had a PCN reaction causing severe rash involving mucus membranes or skin necrosis: NO Has patient had a PCN reaction that required hospitalization NO Has patient had a PCN reaction occurring within the last 10 years: NO If all of the above answers are "NO", then may proceed with Cephalosporin use. Other reaction(s): Other (See Comments) Underarms (both) Other Reaction: "red hot skin" Underarms (both) Has patient had a PCN reaction causing immediate rash, facial/tongue/throat swelling, SOB or lightheadedness with hypotension: YES Has patient had a PCN reaction causing severe rash involving mucus membranes or skin necrosis: NO Has patient had a PCN reaction that required hospitalization NO Has patient had a PCN reaction occurring within the last 10 years: NO If all of the above answers are "NO", then may proceed with  Cephalosporin use.   Prednisone Itching    Pt states she cannot take prednisone with corn filler 05/19/2019.   Brilinta [Ticagrelor]     headache   Carvedilol     dry mouth   Cephalexin Other (See Comments)    Reaction not recalled by the patient   Dilt-Xr [Diltiazem Hcl]     LE edema   Formoterol Fumarate Other (See Comments)    Reaction not  recalled   Fosfomycin     Nerve pain in her legs   Gold Sodium Thiosulfate Other (See Comments)    Positive patch test   Gold-Containing Drug Products Other (See Comments)    Skin tingles   Hydralazine    Levofloxacin In D5w Other (See Comments)    Other Reaction: racing heart Other reaction(s): Other (See Comments) Other Reaction: racing heart   Macrodantin [Nitrofurantoin Macrocrystal] Itching   Moxifloxacin Other (See Comments)    Caused hands to "shake"   Neomycin Other (See Comments)    Doesn't remember - allergist said not to use it because it could add more allergies- Positive patch test    Ofloxacin Itching   Praluent [Alirocumab]     shaking   Ranexa [Ranolazine Er]     itching   Repatha [Evolocumab]     shaking   Valsartan Other (See Comments)    Pt reports this med causes her to feel sluggish and she feels heaviness on her shoulders.   Welchol [Colesevelam]    Zetia [Ezetimibe]    Acetaminophen Rash and Other (See Comments)    Facial rash   Fexofenadine Palpitations and Other (See Comments)    Heart races   Ibuprofen Rash   Latex Rash and Other (See Comments)    Skin gets red    Levofloxacin Palpitations and Other (See Comments)    HEART RACING   Mold Extract [Trichophyton Mentagrophyte] Other (See Comments)    Bumps on back, stops up sinuses.   Molds & Smuts Rash and Other (See Comments)    Bumps on back, stops up sinuses.   Nickel Rash   Tape Rash   Trichophyton Rash and Other (See Comments)    Bumps on back, stops up sinuses     Assessment/Plan:  1. HTN -  BP mildly elevated today, however will loosen  BP goal to < 140/18mmHg given 3 falls in the last month. Unclear if related to her BP or not. Pt advised to follow up with PCP, some symptoms sound like vertigo with dizziness when she turns her head quickly. Pt advised to continue monitoring BP at home and call with concerns. Refill sent in on prn furosemide per pt request. Recommend hydralazine over nitroglycerin if having high blood pressure w/o chest pain. Pt planned to look through her records to see if she could find prior specific intolerance with Imdur. Currently noting chest pain ~once a month.  2. HLD: LDL elevated at 116 above goal <55. Previously intolerant to statins, Praluent, and Repatha. Submitting pt assistance form for Leqvio per prior visit with Dr Debara Pickett.  Megan E. Supple, PharmD, BCACP, [facility] [address], [place], [place] [address] Phone: [phone]; Fax: [phone] 08/08/2021 10:51 AM

## 2021-08-08 NOTE — Patient Instructions (Addendum)
It was nice to see you today  Your blood pressure goal is < 140/34mmHg  Continue taking your current medications  Keep an eye on your blood pressure at home and let us know if it's consistently running low  Isosorbide (Imdur) is the medication you took a few years ago that can help prevent angina. Let me know if you would like to retry this  Follow up with your primary care doctor  We'll submit information on Leqvio (cholesterol injection) coverage

## 2021-08-14 ENCOUNTER — Other Ambulatory Visit: Payer: Self-pay | Admitting: Gastroenterology

## 2021-08-19 NOTE — Progress Notes (Signed)
Follow Up Note  RE: LITTIE CHIEM MRN: [id number] DOB: 08-02-1946 Date of Office Visit: 08/20/2021  Referring provider: [name], MD Primary care provider: [name], MD  Chief Complaint: Oral Pain (Burning in mouth x 1 year)  History of Present Illness: I had the pleasure of seeing Donna Bailey for a follow up visit at the [facility] [facility] on 08/20/2021. She is a 76 y.o. female, who is being followed for allergic reaction, chronic rhinitis, adverse food reaction, multiple drug allergies. Her previous allergy office visit was on 07/07/2019 with Dr. Maudie Mercury. Today is a new complaint visit of oral pain.  Patient has been having issues with dry mouth and it feels like she has burning sensation for the past 1 year and it has been worsening. Denies Sjogren's diagnosis.  Water and grapefruit juice seems to help for a few moments at a time.  She is using a special mouthwash with some benefit.  Denies any specific triggers.  Patient does not wear a cpap machine.  No issues with tasting foods.  She is limiting fatty foods due to her pancreatic insufficiency.   Past work up includes: 2020 bloodwork was negative to select foods. Dietary History: patient has been eating other foods including milk, eggs, peanut, treenuts, sesame, shellfish, fish, soy, wheat, meats, fruits and vegetables.  Patient does have ENT appointment due to an ear ache.  Patient does take Nexium 40mg  daily with good benefit.  Patient does not have dentures. She had an insert for titanium for her tooth which seems to be irritating her gums. Interested in metal patch testing.   She stopped using toothpaste - using salt/baking soda. Not using any mouthwash.   Patient has neuropathy.   Allergic reaction/food Avoiding all medications with corn fillers. Currently avoiding corn.   Assessment and Plan: [name] is a 76 y.o. 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  No specific triggers noted.  Concerned about allergies and metal allergies.  Denies Sjogren's diagnosis. Today's skin prick testing showed: Negative to indoor/outdoor allergens and food panel. Recommend follow up with dentist. Discussed that her symptoms are not caused by environmental or food allergies.  Gave handout on burning mouth syndrome. Given her history of neuropathy question if gabapentin may help but it has inactive ingredient of cornstarch - patient had issues with corn fillers in the past.  Consider metal patch testing as she is having some irritation from her dental work with titanium - this is done our [facility] or [facility].   Multiple drug allergies Continue to avoid drugs that bother you - the ones with corn fillers.  For mild symptoms you can take over the counter antihistamines such as Benadryl and monitor symptoms closely. If symptoms worsen or if you have severe symptoms including breathing issues, throat closure, significant swelling, whole body hives, severe diarrhea and vomiting, lightheadedness then seek immediate medical care.  Return if symptoms worsen or fail to improve.  No orders of the defined types were placed in this encounter.  Lab Orders  No laboratory test(s) ordered today    Diagnostics: Skin Testing: Environmental allergy panel and food panel. Negative to indoor/outdoor allergens and food panel. Results discussed with patient/family.  Airborne Adult Perc - 08/20/21 1145     Time Antigen Placed 1145    Allergen Manufacturer Lavella Hammock    Location Back    Number of Test 59    Panel 1 Select  1. Control-Buffer 50% Glycerol Negative    2. Control-Histamine 1 mg/ml 2+    3. Albumin saline Negative    4. Sanford Negative    5. [place] Negative    6. Johnson Negative    7. [place] Blue Negative    8. Meadow Fescue Negative    9. Perennial Rye Negative    10. Sweet Vernal Negative    11. Timothy Negative    12. Cocklebur  Negative    13. Burweed Marshelder Negative    14. Ragweed, short Negative    15. Ragweed, Giant Negative    16. Plantain,  English Negative    17. Lamb's Quarters Negative    18. Sheep Sorrell Negative    19. Rough Pigweed Negative    20. Marsh Elder, Rough Negative    21. Mugwort, Common Negative    22. Ash mix Negative    23. Birch mix Negative    24. Beech American Negative    25. Box, Elder Negative    26. Cedar, red Negative    27. Cottonwood, [place] Negative    28. Elm mix Negative    29. Hickory Negative    30. Maple mix Negative    31. Oak, [place] mix Negative    32. Pecan Pollen Negative    33. Pine mix Negative    34. Sycamore Eastern Negative    35. Boston, Black Pollen Negative    36. Alternaria alternata Negative    37. Cladosporium Herbarum Negative    38. Aspergillus mix Negative    39. Penicillium mix Negative    40. Bipolaris sorokiniana (Helminthosporium) Negative    41. Drechslera spicifera (Curvularia) Negative    42. Mucor plumbeus Negative    43. Fusarium moniliforme Negative    44. Aureobasidium pullulans (pullulara) Negative    45. Rhizopus oryzae Negative    46. Botrytis cinera Negative    47. Epicoccum nigrum Negative    48. Phoma betae Negative    49. Candida Albicans Negative    50. Trichophyton mentagrophytes Negative    51. Mite, D Farinae  5,000 AU/ml Negative    52. Mite, D Pteronyssinus  5,000 AU/ml Negative    53. Cat Hair 10,000 BAU/ml Negative    54.  Dog Epithelia Negative    55. Mixed Feathers Negative    56. Horse Epithelia Negative    57. Cockroach, German Negative    58. Mouse Negative    59. Tobacco Leaf Negative             Food Adult Perc - 08/20/21 1100     Time Antigen Placed 1145    Allergen Manufacturer Lavella Hammock    Location Back    Number of allergen test 72    Panel 2 Select    1. Peanut Negative    2. Soybean Negative    3. Wheat Negative    4. Sesame Negative    5. Milk, cow Negative    6. Egg White,  Chicken Negative    7. Casein Negative    8. Shellfish Mix Negative    9. Fish Mix Negative    10. Cashew Negative    11. Pecan Food Negative    12. [place] Negative    13. Almond Negative    14. Hazelnut Negative    15. [place] nut Negative    16. Coconut Negative    17. Pistachio Negative    18. Catfish Negative    19. Bass Negative  20. Trout Negative    21. Tuna Negative    22. Salmon Negative    23. Flounder Negative    24. Codfish Negative    25. Shrimp Negative    26. Crab Negative    27. Lobster Negative    28. Oyster Negative    29. Scallops Negative    30. Barley Negative    31. Oat  Negative    32. Rye  Negative    33. Hops Negative    34. Rice Negative    35. Cottonseed Negative    36. Saccharomyces Cerevisiae  Negative    37. Pork Negative    38. [place] Meat Negative    39. Chicken Meat Negative    40. Beef Negative    41. Lamb Negative    42. Tomato Negative    43. White Potato Negative    44. Sweet Potato Negative    45. Pea, Green/English Negative    46. Navy Bean Negative    47. Mushrooms Negative    48. Avocado Negative    49. Onion Negative    50. Cabbage Negative    51. Carrots Negative    52. Celery Negative    53. Corn Negative    54. Cucumber Negative    55. Grape (White seedless) Negative    56. Orange  Negative    57. Banana Negative    58. Apple Negative    59. Peach Negative    60. Strawberry Negative    61. Cantaloupe Negative    62. Watermelon Negative    63. Pineapple Negative    64. Chocolate/Cacao bean Negative    65. Karaya Gum Negative    66. Acacia (Arabic Gum) Negative    67. Cinnamon Negative    68. Nutmeg Negative    69. Ginger Negative    70. Garlic Negative    71. Pepper, black Negative    72. Mustard Negative             Medication List:  Current Outpatient Medications  Medication Sig Dispense Refill   Ascorbic Acid (VITAMIN C) 100 MG tablet Take 100 mg by mouth daily.     aspirin 81 MG  chewable tablet Chew 1 tablet (81 mg total) by mouth daily. 30 tablet 2   Cyanocobalamin (VITAMIN B-12 IJ) Inject 1,000 mg as directed every 30 (thirty) days.     esomeprazole (NEXIUM) 40 MG capsule TAKE 1 CAPSULE BY MOUTH TWICE A DAY 180 capsule 0   furosemide (LASIX) 20 MG tablet Take 1 to 2 tablets daily as needed for swelling 60 tablet 3   Gabapentin 10 % CREA Apply 1-2 application topically at bedtime.     guaiFENesin-codeine (ROBITUSSIN AC) 100-10 MG/5ML syrup Take 5 mLs by mouth 3 (three) times daily as needed for cough.     hydrALAZINE (APRESOLINE) 25 MG tablet Take 1 tablet (25 mg total) by mouth daily as needed (for BP greater than 347 systolic). 30 tablet 3   hydrOXYzine (VISTARIL) 25 MG capsule Take 50 mg by mouth every 8 (eight) hours as needed for anxiety.      inclisiran (LEQVIO) 284 MG/1.5ML SOSY injection 284 mg. Administered at day one of treatment, then at 3 months and then every 6 months thereafter.     lidocaine (LIDODERM) 5 % Place 0.5 patches onto the skin daily as needed (pain).     MAGNESIUM PO Take 1 tablet by mouth at bedtime.     nitroGLYCERIN (NITROSTAT) 0.4 MG  SL tablet Place 1 tablet (0.4 mg total) under the tongue every 5 (five) minutes as needed for chest pain. 25 tablet 2   ondansetron (ZOFRAN) 4 MG tablet Take 1 tablet (4 mg total) by mouth every 8 (eight) hours as needed for nausea or vomiting. 60 tablet 1   triamcinolone ointment (KENALOG) 0.1 % Apply 1 application topically 2 (two) times daily as needed (dry/itchy skin).     zolpidem (AMBIEN) 10 MG tablet Take 1 tablet (10 mg total) by mouth at bedtime as needed for sleep. 30 tablet 5   No current facility-administered medications for this visit.   Allergies: Allergies  Allergen Reactions   Cephalosporins Itching    Other reaction(s): Other (See Comments) Unknown Tolerated cefdinir in 2019   Crestor [Rosuvastatin] Other (See Comments)    Lost all muscle mobility    Doxycycline Diarrhea and Nausea And  Vomiting    Other reaction(s): Other (See Comments)   Formoterol Other (See Comments)    Reaction not recalled   Other Anaphylaxis, Itching, Rash and Other (See Comments)    Corn fillers, corn by-products - causes severe itching Polyester-"pins sticking in her skin    Sulfa Antibiotics Anaphylaxis   Sulfonamide Derivatives Anaphylaxis   Ciprofloxacin Rash and Other (See Comments)   Nitrofurantoin Diarrhea, Nausea And Vomiting and Other (See Comments)    Also, "convulsions/constant shaking"- per patient   Penicillins Rash    Underarms (both) Has patient had a PCN reaction causing immediate rash, facial/tongue/throat swelling, SOB or lightheadedness with hypotension: YES Has patient had a PCN reaction causing severe rash involving mucus membranes or skin necrosis: NO Has patient had a PCN reaction that required hospitalization NO Has patient had a PCN reaction occurring within the last 10 years: NO If all of the above answers are "NO", then may proceed with Cephalosporin use. Other reaction(s): Other (See Comments) Underarms (both) Other Reaction: "red hot skin" Underarms (both) Has patient had a PCN reaction causing immediate rash, facial/tongue/throat swelling, SOB or lightheadedness with hypotension: YES Has patient had a PCN reaction causing severe rash involving mucus membranes or skin necrosis: NO Has patient had a PCN reaction that required hospitalization NO Has patient had a PCN reaction occurring within the last 10 years: NO If all of the above answers are "NO", then may proceed with Cephalosporin use.   Prednisone Itching    Pt states she cannot take prednisone with corn filler 05/19/2019.   Amlodipine     Swelling in legs   Brilinta [Ticagrelor]     headache   Carvedilol     dry mouth   Cephalexin Other (See Comments)    Reaction not recalled by the patient   Dilt-Xr [Diltiazem Hcl]     LE edema   Formoterol Fumarate Other (See Comments)    Reaction not recalled    Fosfomycin     Nerve pain in her legs   Gold Sodium Thiosulfate Other (See Comments)    Positive patch test   Gold-Containing Drug Products Other (See Comments)    Skin tingles   Hydralazine    Levofloxacin In D5w Other (See Comments)    Other Reaction: racing heart Other reaction(s): Other (See Comments) Other Reaction: racing heart   Macrodantin [Nitrofurantoin Macrocrystal] Itching   Moxifloxacin Other (See Comments)    Caused hands to "shake"   Neomycin Other (See Comments)    Doesn't remember - allergist said not to use it because it could add more allergies- Positive patch test  Ofloxacin Itching   Praluent [Alirocumab]     shaking   Ranexa [Ranolazine Er]     itching   Repatha [Evolocumab]     shaking   Valsartan Other (See Comments)    Pt reports this med causes her to feel sluggish and she feels heaviness on her shoulders.   Welchol [Colesevelam]    Zetia [Ezetimibe]    Acetaminophen Rash and Other (See Comments)    Facial rash   Fexofenadine Palpitations and Other (See Comments)    Heart races   Ibuprofen Rash   Latex Rash and Other (See Comments)    Skin gets red    Levofloxacin Palpitations and Other (See Comments)    HEART RACING   Mold Extract [Trichophyton Mentagrophyte] Other (See Comments)    Bumps on back, stops up sinuses.   Molds & Smuts Rash and Other (See Comments)    Bumps on back, stops up sinuses.   Nickel Rash   Tape Rash   Trichophyton Rash and Other (See Comments)    Bumps on back, stops up sinuses   I reviewed her past medical history, social history, family history, and environmental history and no significant changes have been reported from her previous visit.  Review of Systems  Constitutional:  Negative for appetite change, chills, fever and unexpected weight change.  HENT:  Negative for congestion and rhinorrhea.        Mouth discomfort.  Eyes:  Negative for itching.  Respiratory:  Negative for cough, chest tightness, shortness  of breath and wheezing.   Gastrointestinal:  Negative for abdominal pain.  Skin:  Negative for rash.  Allergic/Immunologic: Negative for environmental allergies and food allergies.  Neurological:  Negative for headaches.   Objective: BP 122/72    Pulse 74    Temp 98.3 F (36.8 C) (Temporal)    Resp 16    SpO2 97%  There is no height or weight on file to calculate BMI. Physical Exam Vitals and nursing note reviewed.  Constitutional:      Appearance: Normal appearance. She is well-developed.  HENT:     Head: Normocephalic and atraumatic.     Right Ear: Tympanic membrane and external ear normal.     Left Ear: Tympanic membrane and external ear normal.     Nose: Nose normal.     Mouth/Throat:     Mouth: Mucous membranes are moist.     Pharynx: Oropharynx is clear.  Eyes:     Conjunctiva/sclera: Conjunctivae normal.  Cardiovascular:     Rate and Rhythm: Normal rate and regular rhythm.     Heart sounds: Normal heart sounds. No murmur heard. Pulmonary:     Effort: Pulmonary effort is normal.     Breath sounds: Normal breath sounds. No wheezing, rhonchi or rales.  Musculoskeletal:     Cervical back: Neck supple.  Skin:    General: Skin is warm.     Findings: No rash.  Neurological:     Mental Status: She is alert and oriented to person, place, and time.  Psychiatric:        Behavior: Behavior normal.   Previous notes and tests were reviewed. The plan was reviewed with the patient/family, and all questions/concerned were addressed.  It was my pleasure to see Donna Bailey today and participate in her care. Please feel free to contact me with any questions or concerns.  Sincerely,  Rexene Alberts, DO Allergy & Immunology  [facility] office: [place]  office: [phone]

## 2021-08-20 ENCOUNTER — Encounter: Payer: Self-pay | Admitting: Allergy

## 2021-08-20 ENCOUNTER — Ambulatory Visit (INDEPENDENT_AMBULATORY_CARE_PROVIDER_SITE_OTHER): Payer: Medicare Other | Admitting: Allergy

## 2021-08-20 ENCOUNTER — Other Ambulatory Visit: Payer: Self-pay

## 2021-08-20 VITALS — BP 122/72 | HR 74 | Temp 98.3°F | Resp 16

## 2021-08-20 DIAGNOSIS — T7840XD Allergy, unspecified, subsequent encounter: Secondary | ICD-10-CM | POA: Diagnosis not present

## 2021-08-20 DIAGNOSIS — T7840XA Allergy, unspecified, initial encounter: Secondary | ICD-10-CM | POA: Diagnosis not present

## 2021-08-20 DIAGNOSIS — Z0182 Encounter for allergy testing: Secondary | ICD-10-CM | POA: Diagnosis not present

## 2021-08-20 DIAGNOSIS — T781XXD Other adverse food reactions, not elsewhere classified, subsequent encounter: Secondary | ICD-10-CM

## 2021-08-20 DIAGNOSIS — Z889 Allergy status to unspecified drugs, medicaments and biological substances status: Secondary | ICD-10-CM | POA: Diagnosis not present

## 2021-08-20 DIAGNOSIS — K146 Glossodynia: Secondary | ICD-10-CM | POA: Diagnosis not present

## 2021-08-20 NOTE — Assessment & Plan Note (Signed)
•   Continue to avoid drugs that bother you - the ones with corn fillers.  ? For mild symptoms you can take over the counter antihistamines such as Benadryl and monitor symptoms closely. If symptoms worsen or if you have severe symptoms including breathing issues, throat closure, significant swelling, whole body hives, severe diarrhea and vomiting, lightheadedness then seek immediate medical care.

## 2021-08-20 NOTE — Assessment & Plan Note (Signed)
Worsening symptoms for the last 1 year.  No specific triggers noted.  Concerned about allergies and metal allergies.  Denies Sjogren's diagnosis.  Today's skin prick testing showed: Negative to indoor/outdoor allergens and food panel.  Recommend follow up with dentist.  Discussed that her symptoms are not caused by environmental or food allergies.   Gave handout on burning mouth syndrome.  Given her history of neuropathy question if gabapentin may help but it has inactive ingredient of cornstarch - patient had issues with corn fillers in the past.   Consider metal patch testing as she is having some irritation from her dental work with titanium - this is done our [facility] or [facility].

## 2021-08-20 NOTE — Patient Instructions (Addendum)
Today's skin testing showed: Negative to indoor/outdoor allergens and food panel.  Results given.  Mouth symptoms: Recommend that you go see your dentist for this as well. I don't think this is caused by any allergies.  Read handout.   Metal patch testing:  Consider metal patch testing - this is done our [facility] or [facility].  Patches are best placed on Monday with return to office on Wednesday and Friday of same week for readings.  Patches once placed should not get wet.  You do not have to stop any medications for patch testing but should not be on oral prednisone. You can schedule a patch testing visit when convenient for your schedule.    Allergic reaction/multiple drug allergies:  Continue to avoid drugs that bother you - the ones with corn fillers.  For mild symptoms you can take over the counter antihistamines such as Benadryl and monitor symptoms closely. If symptoms worsen or if you have severe symptoms including breathing issues, throat closure, significant swelling, whole body hives, severe diarrhea and vomiting, lightheadedness then seek immediate medical care.  Follow up as needed.

## 2021-08-23 DIAGNOSIS — R42 Dizziness and giddiness: Secondary | ICD-10-CM | POA: Insufficient documentation

## 2021-09-02 ENCOUNTER — Ambulatory Visit: Payer: Medicare Other | Admitting: Family Medicine

## 2021-09-02 ENCOUNTER — Other Ambulatory Visit: Payer: Self-pay

## 2021-09-02 ENCOUNTER — Ambulatory Visit (INDEPENDENT_AMBULATORY_CARE_PROVIDER_SITE_OTHER): Payer: Medicare Other | Admitting: Podiatry

## 2021-09-02 ENCOUNTER — Encounter: Payer: Self-pay | Admitting: Podiatry

## 2021-09-02 ENCOUNTER — Ambulatory Visit (INDEPENDENT_AMBULATORY_CARE_PROVIDER_SITE_OTHER): Payer: Medicare Other

## 2021-09-02 DIAGNOSIS — M8588 Other specified disorders of bone density and structure, other site: Secondary | ICD-10-CM | POA: Insufficient documentation

## 2021-09-02 DIAGNOSIS — B379 Candidiasis, unspecified: Secondary | ICD-10-CM | POA: Insufficient documentation

## 2021-09-02 DIAGNOSIS — R251 Tremor, unspecified: Secondary | ICD-10-CM | POA: Insufficient documentation

## 2021-09-02 DIAGNOSIS — N952 Postmenopausal atrophic vaginitis: Secondary | ICD-10-CM | POA: Insufficient documentation

## 2021-09-02 DIAGNOSIS — E559 Vitamin D deficiency, unspecified: Secondary | ICD-10-CM | POA: Insufficient documentation

## 2021-09-02 DIAGNOSIS — R1310 Dysphagia, unspecified: Secondary | ICD-10-CM | POA: Insufficient documentation

## 2021-09-02 DIAGNOSIS — R0989 Other specified symptoms and signs involving the circulatory and respiratory systems: Secondary | ICD-10-CM | POA: Insufficient documentation

## 2021-09-02 DIAGNOSIS — J9801 Acute bronchospasm: Secondary | ICD-10-CM | POA: Insufficient documentation

## 2021-09-02 DIAGNOSIS — K30 Functional dyspepsia: Secondary | ICD-10-CM | POA: Insufficient documentation

## 2021-09-02 DIAGNOSIS — D510 Vitamin B12 deficiency anemia due to intrinsic factor deficiency: Secondary | ICD-10-CM | POA: Insufficient documentation

## 2021-09-02 DIAGNOSIS — N1831 Chronic kidney disease, stage 3a: Secondary | ICD-10-CM | POA: Insufficient documentation

## 2021-09-02 DIAGNOSIS — F430 Acute stress reaction: Secondary | ICD-10-CM | POA: Insufficient documentation

## 2021-09-02 DIAGNOSIS — Z79899 Other long term (current) drug therapy: Secondary | ICD-10-CM | POA: Insufficient documentation

## 2021-09-02 DIAGNOSIS — R600 Localized edema: Secondary | ICD-10-CM

## 2021-09-02 DIAGNOSIS — R899 Unspecified abnormal finding in specimens from other organs, systems and tissues: Secondary | ICD-10-CM | POA: Insufficient documentation

## 2021-09-02 DIAGNOSIS — R413 Other amnesia: Secondary | ICD-10-CM | POA: Insufficient documentation

## 2021-09-02 DIAGNOSIS — G609 Hereditary and idiopathic neuropathy, unspecified: Secondary | ICD-10-CM | POA: Insufficient documentation

## 2021-09-02 DIAGNOSIS — Z889 Allergy status to unspecified drugs, medicaments and biological substances status: Secondary | ICD-10-CM | POA: Insufficient documentation

## 2021-09-02 DIAGNOSIS — Z8669 Personal history of other diseases of the nervous system and sense organs: Secondary | ICD-10-CM | POA: Insufficient documentation

## 2021-09-02 DIAGNOSIS — M79671 Pain in right foot: Secondary | ICD-10-CM | POA: Diagnosis not present

## 2021-09-02 DIAGNOSIS — J4520 Mild intermittent asthma, uncomplicated: Secondary | ICD-10-CM | POA: Insufficient documentation

## 2021-09-02 DIAGNOSIS — N3281 Overactive bladder: Secondary | ICD-10-CM | POA: Insufficient documentation

## 2021-09-02 DIAGNOSIS — I959 Hypotension, unspecified: Secondary | ICD-10-CM | POA: Insufficient documentation

## 2021-09-02 DIAGNOSIS — N3090 Cystitis, unspecified without hematuria: Secondary | ICD-10-CM | POA: Insufficient documentation

## 2021-09-02 DIAGNOSIS — L719 Rosacea, unspecified: Secondary | ICD-10-CM | POA: Insufficient documentation

## 2021-09-02 DIAGNOSIS — H9312 Tinnitus, left ear: Secondary | ICD-10-CM | POA: Insufficient documentation

## 2021-09-02 DIAGNOSIS — R296 Repeated falls: Secondary | ICD-10-CM | POA: Insufficient documentation

## 2021-09-02 DIAGNOSIS — K8689 Other specified diseases of pancreas: Secondary | ICD-10-CM | POA: Insufficient documentation

## 2021-09-02 DIAGNOSIS — E538 Deficiency of other specified B group vitamins: Secondary | ICD-10-CM | POA: Insufficient documentation
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
## 2021-09-02 NOTE — Progress Notes (Deleted)
Follow-up Note  RE: Donna Bailey MRN: [id number] DOB: September 22, 1945 Date of Office Visit: 09/02/2021  Primary care provider: [name], MD Referring provider: [name], MD   Donna Bailey returns to the office today for the patch test placement, given suspected history of contact dermatitis.    Diagnostics: Metals testing patches placed.    Plan:   Allergic contact dermatitis - Instructions provided on care of the patches for the next 48 hours. Donna Bailey was instructed to avoid showering for the next 48 hours. Donna Bailey will follow up in 48 hours and 96 hours for patch readings.

## 2021-09-02 NOTE — Patient Instructions (Incomplete)
Follow-up Note  RE: Donna Bailey MRN: [id number] DOB: 1945-12-21 Date of Office Visit: @[facility] @  Primary care provider: [name], MD Referring provider: [name], MD   Donna Bailey returns to the office today for the patch test placement, given suspected history of contact dermatitis.    Diagnostics: Metals testing patches placed.    Plan:   Allergic contact dermatitis - Instructions provided on care of the patches for the next 48 hours. Donna Bailey was instructed to avoid showering for the next 48 hours. Donna Bailey will follow up in 48 hours and 96 hours for patch readings.    Call the clinic if this treatment plan is not working well for you  Follow up in 2 days or sooner if needed.

## 2021-09-02 NOTE — Progress Notes (Signed)
HPI female never smoker followed for bronchitis, allergic rhinitis, chronic insomnia, "intolerance of all corn products", complicated by GERD and multiple medical problems, CAD/MI/ stent Seroquel made her too dizzy. Belsomra 15 mg made her muscles stiff and left her groggy but did help her sleep. Rozerem did not help with sleep but made her dizzy She again requests prescription for zolpidem 10 mg, insisting that nothing else has worked as well to help her manage chronic insomnia. She limits foods with magnesium which she says make her feel hot She tells me she is allergic to nickel, gold and polyester "all of which conduct electricity". Therefore she wants a letter for her to present to [place] asking them to change her electric meter to a "noncommunicating electric meter", so that [employer] will stop sending radio waves through her home. She prefers a manual meter check.  ------------------------------------------------------------------------------------------   05/02/21- 75 yoF followed for hx of allergic Rhinitis, chronic Bronchitis,  complicated by CAD/ MI/ stent/CM,  and intolerance to corn products. Obesity, Hyperlipidemia, Migraine, Hiatal Hernia, GERD/ Barretts,  Covid vax- 3 Phizer    We discussed updated booster. Flu vax- today senior Notes occasional, intermittent mild cough with scant watery phlegm and asks if this is "cardiac cough". Not positional. Asks to keep codeine cough syrup on hand- also uses a little at times to help sleep- dicussed.  No longer takes [place].  Feels tired, snores, wakes with sore, dry mouth. We discussed possible OSA and she agrees to sleep study. She may want oral appliance if appropriate. Denies recent reflux events. Pending cardiology f/u.  09/03/21- 75 yoF followed for hx of allergic Rhinitis, chronic Bronchitis,  complicated by CAD/ MI/ stent/CM,  and intolerance to corn products. Obesity, Hyperlipidemia, Migraine, Hiatal Hernia, GERD/ Barretts,  Covid  vax- 3 Phizer    Flu vax- had HST ordered for 12/19- not done Allergy Skin Testing 08/20/21- Negative Being evaluated by Allergist for Burning Mouth Syndrome. -----Patient states that she needs to follow up about a HST and needs refill on cough syrup.  She chose not to have home sleep test done.  She said the nasal thermistor probe itched and the carrying case had "mold" which she claimed had now "contaminated her space".  She endorses allergies to polyester and corn.  She is particularly concerned about "aflatoxin in corn".  We do not have objective documentation or clear syndrome pattern.  Taking zolpidem 10 mg at at bedtime helps insomnia and well-tolerated. CXR 05/23/21- IMPRESSION: 1. No acute intrathoracic process.   ROS-see HPI   + = positive Constitutional:    weight loss, night sweats, fevers, chills, +fatigue, lassitude. HEENT:    headaches, difficulty swallowing, tooth/dental problems, sore throat,       sneezing, itching, ear ache, nasal congestion, post nasal drip, snoring CV:    chest pain, orthopnea, PND, swelling in lower extremities, anasarca,                          dizziness, palpitations Resp:   shortness of breath with exertion or at rest.                +productive cough,   non-productive cough, coughing up of blood.              change in color of mucus.  wheezing.   Skin:    rash or lesions. GI:  No-   heartburn, indigestion, abdominal pain, nausea, vomiting, diarrhea,  change in bowel habits, loss of appetite GU: dysuria, change in color of urine, no urgency or frequency.   flank pain. MS:   joint pain, stiffness, decreased range of motion, back pain. Neuro-     nothing unusual Psych:  change in mood or affect.  depression or anxiety.   memory loss.  OBJ- Physical Exam General- Alert, Oriented, Affect-appropriate, Distress- none acute, + obese Skin- rash-none, lesions- none, excoriation- none Lymphadenopathy- none Head-             Eyes- Gross  vision intact, PERRLA, conjunctivae and secretions clear            Ears- Hearing, canals-normal            Nose- Clear, no-Septal dev, mucus, polyps, erosion, perforation             Throat- Mallampati II , mucosa clear , drainage- none, tonsils- atrophic, + teeth Neck- flexible , trachea midline, no stridor , thyroid nl, carotid no bruit Chest - symmetrical excursion , unlabored           Heart/CV- RRR , no murmur , no gallop  , no rub, nl s1 s2                           - JVD- none , edema- none, stasis changes- none, varices- none           Lung- clear to P&A, wheeze- none, cough- none , dullness-none, rub- none           Chest wall-  Abd-  Br/ Gen/ Rectal- Not done, not indicated Extrem- cyanosis- none, clubbing, none, atrophy- none, strength- nl Neuro- grossly intact to observation

## 2021-09-03 ENCOUNTER — Encounter: Payer: Self-pay | Admitting: Internal Medicine

## 2021-09-03 ENCOUNTER — Ambulatory Visit (INDEPENDENT_AMBULATORY_CARE_PROVIDER_SITE_OTHER): Payer: Medicare Other | Admitting: Internal Medicine

## 2021-09-03 VITALS — BP 130/78 | HR 66 | Temp 98.5°F | Ht 66.0 in | Wt 192.4 lb

## 2021-09-03 DIAGNOSIS — J209 Acute bronchitis, unspecified: Secondary | ICD-10-CM | POA: Diagnosis not present

## 2021-09-03 DIAGNOSIS — F5101 Primary insomnia: Secondary | ICD-10-CM

## 2021-09-03 DIAGNOSIS — J4520 Mild intermittent asthma, uncomplicated: Secondary | ICD-10-CM

## 2021-09-03 DIAGNOSIS — G4734 Idiopathic sleep related nonobstructive alveolar hypoventilation: Secondary | ICD-10-CM | POA: Diagnosis not present

## 2021-09-03 MED ORDER — ZOLPIDEM TARTRATE 10 MG PO TABS: 10.0000 mg | ORAL_TABLET | Freq: Every evening | ORAL | 5 refills | Status: DC | PRN

## 2021-09-03 MED ORDER — PROMETHAZINE-CODEINE 6.25-10 MG/5ML PO SYRP
5.0000 mL | ORAL_SOLUTION | Freq: Four times a day (QID) | ORAL | 0 refills | Status: DC | PRN
Start: 2021-09-03 — End: 2022-09-01

## 2021-09-03 NOTE — Patient Instructions (Addendum)
Order- overnight oximetry on room air   dx nocturnal hypoxemia  Codeine cough syrup refilled at [facility] Zolpidem refilled at [facility]  Please call if we can help

## 2021-09-04 ENCOUNTER — Encounter: Payer: Medicare Other | Admitting: Family Medicine

## 2021-09-06 ENCOUNTER — Encounter: Payer: Medicare Other | Admitting: Internal Medicine

## 2021-09-12 ENCOUNTER — Other Ambulatory Visit: Payer: Self-pay

## 2021-09-12 DIAGNOSIS — I872 Venous insufficiency (chronic) (peripheral): Secondary | ICD-10-CM

## 2021-09-19 ENCOUNTER — Telehealth: Payer: Self-pay

## 2021-09-19 ENCOUNTER — Ambulatory Visit (HOSPITAL_COMMUNITY)
Admission: RE | Admit: 2021-09-19 | Discharge: 2021-09-19 | Disposition: A | Discharge status: Home / Self Care | Payer: Medicare Other | Source: Ambulatory Visit | Attending: Surgery | Admitting: Surgery

## 2021-09-19 ENCOUNTER — Ambulatory Visit (INDEPENDENT_AMBULATORY_CARE_PROVIDER_SITE_OTHER): Payer: Medicare Other | Admitting: Physician Assistant

## 2021-09-19 ENCOUNTER — Other Ambulatory Visit: Payer: Self-pay

## 2021-09-19 VITALS — BP 151/82 | HR 83 | Temp 98.6°F | Resp 20 | Ht 66.0 in | Wt 191.0 lb

## 2021-09-19 DIAGNOSIS — I872 Venous insufficiency (chronic) (peripheral): Secondary | ICD-10-CM | POA: Insufficient documentation

## 2021-09-19 NOTE — Telephone Encounter (Signed)
° °  Pre-operative Risk Assessment    Patient Name: Donna Bailey  DOB: [birth year] MRN: [id number]   Request for Surgical Clearance  Procedure:   L2 Kyphoplasty  Date of Surgery:  Clearance TBD                               Surgeon:  Dr Melina Schools Surgeon's Group or Practice Name:  [facility] Phone number:  [phone] (contact person Orson Slick)  Fax number:  [phone]  Type of Clearance Requested:   - Medical   Type of Anesthesia:   None Listed  Additional requests/questions:   none listed  Signed, Ulice Brilliant T   09/19/2021, 8:16 AM

## 2021-09-19 NOTE — Telephone Encounter (Signed)
Dr. Johney Frame Pt has a history of CAD - STEMI in 2019, last heart cath 05/2021 without intervention, patent stent, suspected vasospasm. We are asked for clearance for back surgery following an injury that was not caused by a fall (sounds mechanical). She is unable to complete 4.0 METS. Given her recent heart cath, I am not inclined to recommend additional testing prior to back surgery. However, since she can't complete 4.0 METS, I have to reach out to you.   Do you agree with proceeding with kyphoplasty without further testing?

## 2021-09-19 NOTE — Telephone Encounter (Signed)
Completely agree. No need for repeat testing given recent cath. Good call.

## 2021-09-19 NOTE — Telephone Encounter (Signed)
Name: GENESSIS FIELDER  DOB: [birth year]  MRN: [id number]   Primary Cardiologist: Meriam Sprague, MD  Chart reviewed as part of pre-operative protocol coverage. Patient was contacted 09/19/2021 in reference to pre-operative risk assessment for pending surgery as outlined below.  MONEKA ZEINERT was last seen on 06/25/21 by Dr. Shari Prows.  Since that day, YURITZA VIRK has done well. While she is unable to complete 4.0 METS due to back pain and prior ankle injury, she had a recent heart catheterization 05/2021 with stable disease and no new PCI. Vasospasm was suspected. Given recent angiography, we are not inclined to repeat any testing prior to surgery.   Therefore, based on ACC/AHA guidelines, the patient would be at acceptable risk for the planned procedure without further cardiovascular testing.   The patient was advised that if she develops new symptoms prior to surgery to contact our office to arrange for a follow-up visit, and she verbalized understanding.  I will route this recommendation to the requesting party via Epic fax function and remove from pre-op pool. Please call with questions.  Roe Rutherford Trypp Heckmann, PA 09/19/2021, 1:31 PM
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
## 2021-09-20 ENCOUNTER — Ambulatory Visit: Payer: Self-pay | Admitting: Orthopedic Surgery

## 2021-09-20 DIAGNOSIS — Z01818 Encounter for other preprocedural examination: Secondary | ICD-10-CM

## 2021-09-23 ENCOUNTER — Ambulatory Visit (INDEPENDENT_AMBULATORY_CARE_PROVIDER_SITE_OTHER): Payer: Medicare Other | Admitting: Podiatry

## 2021-09-23 ENCOUNTER — Other Ambulatory Visit: Payer: Self-pay

## 2021-09-23 DIAGNOSIS — R600 Localized edema: Secondary | ICD-10-CM | POA: Diagnosis not present

## 2021-09-23 DIAGNOSIS — M778 Other enthesopathies, not elsewhere classified: Secondary | ICD-10-CM

## 2021-09-23 MED ORDER — BETAMETHASONE SOD PHOS & ACET 6 (3-3) MG/ML IJ SUSP
3.0000 mg | Freq: Once | INTRAMUSCULAR | Status: AC
  Administered 2021-09-23: 3 mg via INTRA_ARTICULAR
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
## 2021-09-24 ENCOUNTER — Encounter: Payer: Self-pay | Admitting: Internal Medicine

## 2021-09-24 NOTE — Assessment & Plan Note (Signed)
She limits medication choices by avoidance of any cornstarch fillers feels she does well with zolpidem.  We discussed dose and safety. Plan-okay to continue zolpidem up to 10 mg for sleep if needed.

## 2021-09-24 NOTE — Assessment & Plan Note (Signed)
Within limits of her self defined exposure limitations, instead of a home sleep test we are going to check overnight oximetry.

## 2021-09-24 NOTE — Pre-Procedure Instructions (Signed)
Surgical Instructions    Your procedure is scheduled on Wednesday 10/02/21.   Report to [facility] Main Entrance "A" at 1:30 P.M., then check in with the Admitting office.  Call this number if you have problems the morning of surgery:  [phone]   If you have any questions prior to your surgery date call [phone]: Open Monday-Friday 8am-4pm    Remember:  Do not eat after midnight the night before your surgery  You may drink clear liquids until 12:30 P.M.the morning of your surgery.   Clear liquids allowed are: Water, Non-Citrus Juices (without pulp), Carbonated Beverages, Clear Tea, Black Coffee Only (NO MILK, CREAM OR POWDERED CREAMER of any kind), and Gatorade.    Take these medicines the morning of surgery with A SIP OF WATER   amitriptyline (ELAVIL)   atorvastatin (LIPITOR)  carvedilol (COREG)  esomeprazole (NEXIUM)  felodipine (PLENDIL)  valACYclovir (VALTREX)   Take these medicines if needed:   hydrALAZINE (APRESOLINE)   hydrOXYzine (VISTARIL)   nitroGLYCERIN (NITROSTAT)  ondansetron (ZOFRAN)  promethazine-codeine (PHENERGAN WITH CODEINE)   As of today, STOP taking any Aspirin (unless otherwise instructed by your surgeon) Aleve, Naproxen, Ibuprofen, Motrin, Advil, Goody's, BC's, all herbal medications, fish oil, and all vitamins.                     Do NOT Smoke (Tobacco/Vaping) for 24 hours prior to your procedure.  If you use a CPAP at night, you may bring your mask/headgear for your overnight stay.   Contacts, glasses, piercing's, hearing aid's, dentures or partials may not be worn into surgery, please bring cases for these belongings.    For patients admitted to the hospital, discharge time will be determined by your treatment team.   Patients discharged the day of surgery will not be allowed to drive home, and someone needs to stay with them for 24 hours.  NO VISITORS WILL BE ALLOWED IN PRE-OP WHERE PATIENTS ARE PREPPED FOR SURGERY.  ONLY 1 SUPPORT PERSON  MAY BE PRESENT IN THE WAITING ROOM WHILE YOU ARE IN SURGERY.  IF YOU ARE TO BE ADMITTED, ONCE YOU ARE IN YOUR ROOM YOU WILL BE ALLOWED TWO (2) VISITORS. (1) VISITOR MAY STAY OVERNIGHT BUT MUST ARRIVE TO THE ROOM BY 8pm.  Minor children may have two parents present. Special consideration for safety and communication needs will be reviewed on a case by case basis.   Special instructions:   [place]- Preparing For Surgery  Before surgery, you can play an important role. Because skin is not sterile, your skin needs to be as free of germs as possible. You can reduce the number of germs on your skin by washing with CHG (chlorahexidine gluconate) Soap before surgery.  CHG is an antiseptic cleaner which kills germs and bonds with the skin to continue killing germs even after washing.    Oral Hygiene is also important to reduce your risk of infection.  Remember - BRUSH YOUR TEETH THE MORNING OF SURGERY WITH YOUR REGULAR TOOTHPASTE  Please do not use if you have an allergy to CHG or antibacterial soaps. If your skin becomes reddened/irritated stop using the CHG.  Do not shave (including legs and underarms) for at least 48 hours prior to first CHG shower. It is OK to shave your face.  Please follow these instructions carefully.   Shower the NIGHT BEFORE SURGERY and the MORNING OF SURGERY  If you chose to wash your hair, wash your hair first as usual with your  normal shampoo.  After you shampoo, rinse your hair and body thoroughly to remove the shampoo.  Use CHG Soap as you would any other liquid soap. You can apply CHG directly to the skin and wash gently with a scrungie or a clean washcloth.   Apply the CHG Soap to your body ONLY FROM THE NECK DOWN.  Do not use on open wounds or open sores. Avoid contact with your eyes, ears, mouth and genitals (private parts). Wash Face and genitals (private parts)  with your normal soap.   Wash thoroughly, paying special attention to the area where your surgery  will be performed.  Thoroughly rinse your body with warm water from the neck down.  DO NOT shower/wash with your normal soap after using and rinsing off the CHG Soap.  Pat yourself dry with a CLEAN TOWEL.  Wear CLEAN PAJAMAS to bed the night before surgery  Place CLEAN SHEETS on your bed the night before your surgery  DO NOT SLEEP WITH PETS.   Day of Surgery: Shower with CHG soap. Do not wear jewelry, make up, nail polish, gel polish, artificial nails, or any other type of covering on natural nails including finger and toenails. If patients have artificial nails, gel coating, etc. that need to be removed by a nail salon please have this removed prior to surgery. Surgery may need to be canceled/delayed if the surgeon/anesthesiologist feels like the patient is unable to be adequately monitored. Do not wear lotions, powders, perfumes/colognes, or deodorant. Do not shave 48 hours prior to surgery.  Men may shave face and neck. Do not bring valuables to the hospital. [facility] is not responsible for any belongings or valuables. Wear Clean/Comfortable clothing the morning of surgery Remember to brush your teeth WITH YOUR REGULAR TOOTHPASTE.   Please read over the following fact sheets that you were given.   3 days prior to your procedure or After your COVID test   You are not required to quarantine however you are required to wear a well-fitting mask when you are out and around people not in your household. If your mask becomes wet or soiled, replace with a new one.   Wash your hands often with soap and water for 20 seconds or clean your hands with an alcohol-based hand sanitizer that contains at least 60% alcohol.   Do not share personal items.   Notify your provider:  o if you are in close contact with someone who has COVID  o or if you develop a fever of 100.4 or greater, sneezing, cough, sore throat, shortness of breath or body aches.

## 2021-09-24 NOTE — Assessment & Plan Note (Signed)
She asks refill codeine-based cough syrup to keep available-discussed.

## 2021-09-24 NOTE — Progress Notes (Addendum)
Cardiology Office Note:    Date:  09/25/2021   ID:  TRELLIS GUIRGUIS, DOB 09/06/1945, MRN [id number]  PCP:  [name], MD  [facility] Cardiologist:  Freada Bergeron, MD Cardiology APP:  [facility]    Referring MD: [name], MD   Chief Complaint:  Follow-up for CAD and Leg Swelling    Patient Profile: Coronary artery disease Anterior STEMI 4/19 s/p DES to LAD (c/b radial artery dissection/hematoma)  Cath 6/19: LAD stent paten Myoview 09/2018: low risk Cath 11/2018: LAD stent patent, no change since 6/19 NSTEMI 04/2019 in setting of profound ? BP  >> Cath w/ patent LAD stent  Cath 10/22: Moderate LAD, Dx and RCA dz; patent pLAD stent, mid LAD dz (vasospasm or bridging) borderline by RFR >> Med Rx unless refractory angina  Pt declined taking Dilt due to corn allergy Ischemic CM EF returned to normal [40-45 (4/19) >> 65 (5/19)] Chronic kidney disease stage 2 GERD Barrett's Esophagus Hyperlipidemia  Intol of statins, PCSK9 inhibitors, ezetimibe  Waiting on Inclisiran approval  Asthma  Multiple Med Intolerances/Allergies  Unable to take Plavix, Amlodipine due to allergy to corn filler DC'd Praluent >> shaking DC'd Ranolazine >> Itching DC'd Valsartan >> multiple symptoms Seen by allergist; Food panel w/ corn, red dye negative   Prior CV Studies: Echocardiogram 07/17/2021 EF 60-65, no RWMA, GR 1 DD, normal RVSF, mild LAE, trivial MR, AV sclerosis without stenosis  Cardiac catheterization 05/27/2021  LM normal LAD proximal-mid stent patent, mid 55 (RFR = 0.89); D1 ostial 40, mid 60; lateral D1 50 LCx ostial 20 RCA mid 40 EF 55-65 Moderate LAD, diagonal and RCA disease.  Question vasospasm or myocardial bridging just distal to stent in LAD.  LAD lesion is borderline hemodynamically significant with RFR = 0.89.  Medical therapy recommended.  PCI to LAD should only be considered for refractory angina despite aggressive medical  therapy.  Myoview 09/14/2020 EF 66, normal perfusion, low risk  Myoview 11/23/2019 EF 61, normal perfusion, low risk    Cardiac catheterization 04/26/2019 LM normal  LAD prox stent patent w 25 ISR; D1 60; Lat D1 50 LCx 40 RCA mid 40 EF 55-65   Event Monitor 01/2019 Normal sinus rhythm, sinus tachycardia No arrhythmias, pauses   Cardiac Catheterization 11/26/2018 LM normal LAD prox stent patent; D1 60; Lat D1 50 LCx ost 60 RCA mid 40 EF > 65   Myoview 09/09/2018 Normal stress nuclear study with no ischemia or infarction; EF 69 with normal wall motion.    Cardiac catheterization 01/22/2018 Widely patent stent in mid LAD and stable moderate to severe disease involving branches of D1. Moderate, non-obstructive mid RCA disease. 60% ostial LCx stenosis with minimal luminal area of 5.1 cm^2 by IVUS and FFR 0.85.   Echo 12/04/2017 No new wall motion abnormalities, EF 65, no pericardial effusion, normal aortic root   Echo 11/14/17 EF 40-45, apical ant-sept, inf, inf-sept, apical HK c/w ischemia in the distribution of the LAD   Cardiac Catheterization 11/13/17 LM normal LAD prox 100, D1 80, Lat D1 50 LCx ost 90 RCA mid 25 PCI:  2.75 x 23 mm Sierra DES to LAD    History of Present Illness:   Donna Bailey is a 76 y.o. female with the above problem list.  She was admitted in 10/22 with unstable angina.  Her at hs-Trops remained negative.  Cath demonstrated mod LAD, Dx, RCA dz. Med Rx rec.  She was last seen by Dr.  Pemberton in 06/2021.  She could not tolerate diltiazem, carvedilol, amlodipine.  She was referred to the HTN clinic and has not been able to consistently take meds to lower her BP.  She has hydralazine to take prn.  She was last seen in the HTN clinic in 1/23.    She returns for f/u.  She is here with her husband today, who is also seen.  Of note, she has not taken aspirin in over a year.  She does note that she was able to tolerate Brilinta in the past.  She is not  currently taking atorvastatin carvedilol, felodipine.  She still has not received word about approval for inclisiran.  She fell 2 weeks ago and developed a compression fracture in her back.  She was to have kyphoplasty next week.  However, the procedure was canceled and she has now been referred to a neurosurgeon.  Since that time, she has noted significant lower extremity swelling.  She started having pedal edema back in December when she had some foot problems.  She has been less active since that time.  She saw her PCP last week and her furosemide was increased to 40 mg twice daily.  She has not had significant shortness of breath.  However she has been fairly sedentary.  She did move furniture recently when she hurt her back and may have been somewhat short of breath then.  She has not had orthopnea, PND, chest pain, syncope.  She did see vascular surgery recently.  She did not have significant reflux or evidence of DVT.  She did have evidence of superficial venous thrombosis.  Elevation and compression were recommended.    Past Medical History:  Diagnosis Date   Allergic rhinitis, cause unspecified    Anemia    Aneurysm of right conjunctiva    right eye    Anxiety    Asthma    Barrett's esophagus    CAD in native artery    a. 11/2017: STEMI s/p DES to prox-mid LAD; LCx stenosis managed medically. Case complicated by R forearm hematoma. b. Several subsequent caths, last in 04/2019 with stable disease // Myoview 11/2019: EF 61, normal perfusion; low risk    CKD (chronic kidney disease), stage II    Constipation    Cystocele    Deviated nasal septum    Diaphragmatic hernia without mention of obstruction or gangrene    Esophageal reflux    Fibromyalgia    H/O hiatal hernia    Hypertension    Insomnia, unspecified    Ischemic cardiomyopathy    a. EF 40-45% by echo 11/2017. // Echo 5/19: No new wall motion abnormalities, EF 65, no pericardial effusion, normal aortic root   Kidney stones    hx  of pt see Dr. Risa Grill   Medication intolerance    numerous   Migraine    Myalgia and myositis, unspecified    Myocardial infarct ([place]) 11/13/2017   Myocardial infarction [facility])    Nuclear stress tests    Cardiolite 2/18: no ischemia or scar, EF 78; Low Risk   Osteoarthrosis, unspecified whether generalized or localized, unspecified site    Peripheral neuropathy    Pneumonia    hx of   Pure hypercholesterolemia    Rectocele    Scoliosis (and kyphoscoliosis), idiopathic    Statin intolerance    Temporomandibular joint disorders, unspecified    Urticaria    Wheat allergy    Current Medications: Current Meds  Medication Sig   acetic acid-hydrocortisone (  VOSOL-HC) OTIC solution 5 drops into affected ear   amitriptyline (ELAVIL) 25 MG tablet Take by mouth.   Ascorbic Acid (VITAMIN C) 100 MG tablet Take 100 mg by mouth daily.   carvedilol (COREG) 3.125 MG tablet Take 1 tablet (3.125 mg total) by mouth 2 (two) times daily.   cyanocobalamin (,VITAMIN B-12,) 1000 MCG/ML injection 1,000 mLs (1,000,000 mcg total).   Cyanocobalamin (VITAMIN B-12 IJ) Inject 1,000 mg as directed every 30 (thirty) days.   diphenhydrAMINE (BENADRYL) 25 mg capsule Take by mouth.   esomeprazole (NEXIUM) 40 MG capsule TAKE 1 CAPSULE BY MOUTH TWICE A DAY   Gabapentin 10 % CREA Apply 1-2 application topically at bedtime.   guaiFENesin-codeine (ROBITUSSIN AC) 100-10 MG/5ML syrup Take 5 mLs by mouth 3 (three) times daily as needed for cough.   hydrALAZINE (APRESOLINE) 25 MG tablet Take 1 tablet (25 mg total) by mouth daily as needed (for BP greater than 267 systolic).   hydrOXYzine (VISTARIL) 25 MG capsule Take 50 mg by mouth every 8 (eight) hours as needed for anxiety.    inclisiran (LEQVIO) 284 MG/1.5ML SOSY injection 284 mg. Administered at day one of treatment, then at 3 months and then every 6 months thereafter.   lidocaine (LIDODERM) 5 % Place 0.5 patches onto the skin daily as needed (pain).   MAGNESIUM PO Take 1  tablet by mouth at bedtime.   nitroGLYCERIN (NITROSTAT) 0.4 MG SL tablet Place 1 tablet (0.4 mg total) under the tongue every 5 (five) minutes as needed for chest pain.   ondansetron (ZOFRAN) 4 MG tablet Take 1 tablet (4 mg total) by mouth every 8 (eight) hours as needed for nausea or vomiting.   promethazine-codeine (PHENERGAN WITH CODEINE) 6.25-10 MG/5ML syrup Take 5 mLs by mouth every 6 (six) hours as needed for cough.   ticagrelor (BRILINTA) 60 MG TABS tablet Take 1 tablet (60 mg total) by mouth 2 (two) times daily.   triamcinolone ointment (KENALOG) 0.1 % Apply 1 application topically 2 (two) times daily as needed (dry/itchy skin).   valACYclovir (VALTREX) 1000 MG tablet Take 1,000 mg by mouth 3 (three) times daily.   zolpidem (AMBIEN) 10 MG tablet Take 1 tablet (10 mg total) by mouth at bedtime as needed for sleep.   [DISCONTINUED] aspirin 81 MG EC tablet Take 1 tablet by mouth daily.   [DISCONTINUED] atorvastatin (LIPITOR) 10 MG tablet Take by mouth.   [DISCONTINUED] carvedilol (COREG) 6.25 MG tablet Take by mouth.   [DISCONTINUED] felodipine (PLENDIL) 10 MG 24 hr tablet Take 1 tablet by mouth daily.   [DISCONTINUED] furosemide (LASIX) 20 MG tablet Take 1 to 2 tablets daily as needed for swelling    Allergies:   Cephalosporins, Crestor [rosuvastatin], Doxycycline, Formoterol, Other, Sulfa antibiotics, Sulfonamide derivatives, Ciprofloxacin, Nitrofurantoin, Penicillins, Prednisone, Amlodipine, Brilinta [ticagrelor], Carvedilol, Cephalexin, Dilt-xr [diltiazem hcl], Formoterol fumarate, Fosfomycin, Gold sodium thiosulfate, Gold-containing drug products, Hydralazine, Levofloxacin in d5w, Macrodantin [nitrofurantoin macrocrystal], Moxifloxacin, Neomycin, Ofloxacin, Praluent [alirocumab], Pregabalin, Ranexa [ranolazine er], Repatha [evolocumab], Valsartan, Welchol [colesevelam], Zetia [ezetimibe], Zolpidem tartrate, Acetaminophen, Fexofenadine, Ibuprofen, Latex, Levofloxacin, Mold extract  [trichophyton mentagrophyte], Molds & smuts, Nickel, Tape, and Trichophyton   Social History   Tobacco Use   Smoking status: Never    Passive exposure: Never   Smokeless tobacco: Never  Vaping Use   Vaping Use: Never used  Substance Use Topics   Alcohol use: No    Alcohol/week: 0.0 standard drinks   Drug use: No    Family Hx: The patient's family history includes Breast  cancer in her sister; Cancer in her sister; Cancer (age of onset: 47) in her sister; Colitis in her mother; Colon polyps in her brother; Diverticulosis in her mother; Heart disease in her father and mother; Leukemia in her sister; Tuberculosis in her brother. There is no history of Stomach cancer, Rectal cancer, Esophageal cancer, or Colon cancer.  Review of Systems  Constitutional: Negative for fever.  Skin:  Positive for color change. Negative for rash.  Gastrointestinal:  Negative for diarrhea, hematochezia and vomiting.  Genitourinary:  Negative for hematuria.    EKGs/Labs/Other Test Reviewed:    EKG:  EKG is  ordered today.  The ekg ordered today demonstrates NSR, HR 71, normal axis, no ST-T wave changes, QTc 402  Recent Labs: 07/15/2021: ALT 14; BUN 21; Creatinine, Ser 0.85; Hemoglobin 12.3; Platelets 206.0; Potassium 3.9; Sodium 140   Recent Lipid Panel Recent Labs    10/01/20 0925  CHOL 257*  TRIG 113  HDL 53  LDLCALC 184*     Risk Assessment/Calculations:         Physical Exam:    VS:  BP (!) 160/70 (BP Location: Right Arm, Patient Position: Sitting, Cuff Size: Normal)    Pulse 71    Ht 5\' 6"  (1.676 m)    Wt 195 lb 9.6 oz (88.7 kg)    SpO2 98%    BMI 31.57 kg/m     Wt Readings from Last 3 Encounters:  09/25/21 195 lb 9.6 oz (88.7 kg)  09/19/21 191 lb (86.6 kg)  09/03/21 192 lb 6.4 oz (87.3 kg)    Constitutional:      Appearance: Healthy appearance. Not in distress.  Neck:     Vascular: No JVR. JVD normal.  Pulmonary:     Effort: Pulmonary effort is normal.     Breath sounds: No  wheezing. No rales.  Cardiovascular:     Normal rate. Regular rhythm. Normal S1. Normal S2.      Murmurs: There is no murmur.  Edema:    Peripheral edema present.    Pretibial: bilateral 1+ edema of the pretibial area.    Ankle: bilateral 1+ edema of the ankle.    Feet: bilateral 1+ edema of the feet. Abdominal:     Palpations: Abdomen is soft.  Skin:    General: Skin is warm and dry.  Neurological:     Mental Status: Alert and oriented to person, place and time.     Cranial Nerves: Cranial nerves are intact.        ASSESSMENT & PLAN:   Bilateral lower extremity edema I suspect her lower extremity edema is mainly related to venous insufficiency exacerbated by inactivity from her recent foot problems and back injury.  She may have had some shortness of breath recently.  She does have a history of diastolic dysfunction on echocardiogram.  She has been unable to tolerate most blood pressure medicines and her blood pressure has been uncontrolled.  However, her neck veins are flat and her lungs are clear. She does admit to a diet high in salt.  We discussed the importance of a low-sodium diet.   Recent hemoglobin with primary care was normal.  I will obtain a CMET, TSH and BNP today.  I will provide her with information regarding the lounge Dr.  Harley Alto discussed the importance of leg elevation as well as compression.  I have advised her that she does not have to start with compression stockings that are high pressure and difficult to get on.  If she can at least get some tight fitting socks on, this would at least give her some support and help start the process of reducing her edema.  Continue furosemide 40 mg twice daily.  Essential hypertension Blood pressure is uncontrolled.  She has been taking hydralazine as needed quite often.  We discussed changing this to twice daily every day.  However, she is interested in trying to get her heart rate slower.  She is willing to try carvedilol again.  Start  carvedilol 3.125 mg twice daily.  Of note, she has tolerated taking sublingual nitroglycerin in the past.  If her blood pressure continues to remain uncontrolled, she would be willing to try isosorbide.  Follow-up in 3 months.  Hyperlipidemia LDL goal <70 She has been intolerant to multiple medications.  I will follow-up with Dr. Debara Pickett and our Pharm.D. clinic to see if she has been approved for inclisiran.  Coronary artery disease with stable angina pectoris [facility]) History of anterior STEMI in 2019 treated with a DES to the LAD.  Cardiac catheterization in October demonstrated patent LAD stent.  She had moderate LAD, diagonal and RCA disease.  The LAD lesion was borderline significant by RFR.  Medical therapy has been recommended and PCI could be considered for refractory angina.  She has been doing well without recurrent anginal symptoms.  Of note, she does not take aspirin.  She notes that she has had difficulty with tolerating this due to gastric upset as well as potential bleeding.  She does note that she tolerated Brilinta well when she took it after her myocardial infarction.  She would be willing to try low-dose Brilinta again.  Start Brilinta 60 mg twice daily.  Start carvedilol 3.125 mg twice daily as noted.  As noted, we will follow-up on approval for inclisiran.         Dispo:  Return in about 3 months (around 12/23/2021) for Routine follow up in 3 months with Dr. Johney Frame., or [employer], PA-C.   Medication Adjustments/Labs and Tests Ordered: Current medicines are reviewed at length with the patient today.  Concerns regarding medicines are outlined above.  Tests Ordered: Orders Placed This Encounter  Procedures   Basic Metabolic Panel (BMET)   TSH   Pro b natriuretic peptide   EKG 12-Lead   Medication Changes: Meds ordered this encounter  Medications   furosemide (LASIX) 40 MG tablet    Sig: Take 1 tablet (40 mg total) by mouth 2 (two) times daily.    Dispense:  180 tablet     Refill:  3   ticagrelor (BRILINTA) 60 MG TABS tablet    Sig: Take 1 tablet (60 mg total) by mouth 2 (two) times daily.    Dispense:  60 tablet    Refill:  11   carvedilol (COREG) 3.125 MG tablet    Sig: Take 1 tablet (3.125 mg total) by mouth 2 (two) times daily.    Dispense:  60 tablet    Refill:  [address] [place] Dopp, [place]  09/25/2021 10:34 AM    [facility] [place], [place], [place]  [address] Phone: [phone]; Fax: [phone]

## 2021-09-25 ENCOUNTER — Other Ambulatory Visit: Payer: Self-pay

## 2021-09-25 ENCOUNTER — Ambulatory Visit (INDEPENDENT_AMBULATORY_CARE_PROVIDER_SITE_OTHER): Payer: Medicare Other | Admitting: Physician Assistant

## 2021-09-25 ENCOUNTER — Encounter: Payer: Self-pay | Admitting: Physician Assistant

## 2021-09-25 ENCOUNTER — Inpatient Hospital Stay (HOSPITAL_COMMUNITY)
Admission: RE | Admit: 2021-09-25 | Discharge: 2021-09-25 | Disposition: A | Discharge status: Home / Self Care | Payer: Medicare Other | Source: Ambulatory Visit

## 2021-09-25 VITALS — BP 160/70 | HR 71 | Ht 66.0 in | Wt 195.6 lb

## 2021-09-25 DIAGNOSIS — R0602 Shortness of breath: Secondary | ICD-10-CM

## 2021-09-25 DIAGNOSIS — I25118 Atherosclerotic heart disease of native coronary artery with other forms of angina pectoris: Secondary | ICD-10-CM

## 2021-09-25 DIAGNOSIS — R600 Localized edema: Secondary | ICD-10-CM

## 2021-09-25 DIAGNOSIS — I10 Essential (primary) hypertension: Secondary | ICD-10-CM

## 2021-09-25 DIAGNOSIS — E785 Hyperlipidemia, unspecified: Secondary | ICD-10-CM

## 2021-09-25 MED ORDER — CARVEDILOL 3.125 MG PO TABS: 3.1250 mg | ORAL_TABLET | Freq: Two times a day (BID) | ORAL | 11 refills | Status: DC

## 2021-09-25 MED ORDER — TICAGRELOR 60 MG PO TABS: 60.0000 mg | ORAL_TABLET | Freq: Two times a day (BID) | ORAL | 11 refills | Status: DC

## 2021-09-25 MED ORDER — FUROSEMIDE 40 MG PO TABS: 40.0000 mg | ORAL_TABLET | Freq: Two times a day (BID) | ORAL | 3 refills | Status: DC

## 2021-09-25 NOTE — Assessment & Plan Note (Signed)
I suspect her lower extremity edema is mainly related to venous insufficiency exacerbated by inactivity from her recent foot problems and back injury.  She may have had some shortness of breath recently.  She does have a history of diastolic dysfunction on echocardiogram.  She has been unable to tolerate most blood pressure medicines and her blood pressure has been uncontrolled.  However, her neck veins are flat and her lungs are clear. She does admit to a diet high in salt.  We discussed the importance of a low-sodium diet.   Recent hemoglobin with primary care was normal.  I will obtain a CMET, TSH and BNP today.  I will provide her with information regarding the lounge Dr.  Harley Alto discussed the importance of leg elevation as well as compression.  I have advised her that she does not have to start with compression stockings that are high pressure and difficult to get on.  If she can at least get some tight fitting socks on, this would at least give her some support and help start the process of reducing her edema.  Continue furosemide 40 mg twice daily.

## 2021-09-25 NOTE — Patient Instructions (Signed)
Medication Instructions:   START Brilinta one (1) tablet by mouth ( 60 mg) twice daily  START Coreg one (1) tablet by mouth (3.125 mg) twice daily  *If you need a refill on your cardiac medications before your next appointment, please call your pharmacy*   Lab Work:  TODAY!!!!! CMET/PRO BNP/TSH  If you have labs (blood work) drawn today and your tests are completely normal, you will receive your results only by: [place] (if you have MyChart) OR A paper copy in the mail If you have any lab test that is abnormal or we need to change your treatment, we will call you to review the results.   Follow-Up: At [facility], you and your health needs are our priority.  As part of our continuing mission to provide you with exceptional heart care, we have created designated Provider Care Teams.  These Care Teams include your primary Cardiologist (physician) and Advanced Practice Providers (APPs -  Physician Assistants and Nurse Practitioners) who all work together to provide you with the care you need, when you need it.  We recommend signing up for the patient portal called "MyChart".  Sign up information is provided on this After Visit Summary.  MyChart is used to connect with patients for Virtual Visits (Telemedicine).  Patients are able to view lab/test results, encounter notes, upcoming appointments, etc.  Non-urgent messages can be sent to your provider as well.   To learn more about what you can do with MyChart, go to [email or web address].    Your next appointment:   3 month(s)  The format for your next appointment:   In Person  Provider:   Freada Bergeron, MD     Other Instructions   Please wear compression hose and keep legs elevated.   Low-Sodium Eating Plan Sodium, which is an element that makes up salt, helps you maintain a healthy balance of fluids in your body. Too much sodium can increase your blood pressure and cause fluid and waste to be held in your  body. Your health care provider or dietitian may recommend following this plan if you have high blood pressure (hypertension), kidney disease, liver disease, or heart failure. Eating less sodium can help lower your blood pressure, reduce swelling, and protect your heart, liver, and kidneys. What are tips for following this plan? Reading food labels The Nutrition Facts label lists the amount of sodium in one serving of the food. If you eat more than one serving, you must multiply the listed amount of sodium by the number of servings. Choose foods with less than 140 mg of sodium per serving. Avoid foods with 300 mg of sodium or more per serving. Shopping  Look for lower-sodium products, often labeled as "low-sodium" or "no salt added." Always check the sodium content, even if foods are labeled as "unsalted" or "no salt added." Buy fresh foods. Avoid canned foods and pre-made or frozen meals. Avoid canned, cured, or processed meats. Buy breads that have less than 80 mg of sodium per slice. Cooking  Eat more home-cooked food and less restaurant, buffet, and fast food. Avoid adding salt when cooking. Use salt-free seasonings or herbs instead of table salt or sea salt. Check with your health care provider or pharmacist before using salt substitutes. Cook with plant-based oils, such as canola, sunflower, or olive oil. Meal planning When eating at a restaurant, ask that your food be prepared with less salt or no salt, if possible. Avoid dishes labeled as brined, pickled, cured, smoked, or  made with soy sauce, miso, or teriyaki sauce. Avoid foods that contain MSG (monosodium glutamate). MSG is sometimes added to [place] food, bouillon, and some canned foods. Make meals that can be grilled, baked, poached, roasted, or steamed. These are generally made with less sodium. General information Most people on this plan should limit their sodium intake to 1,500-2,000 mg (milligrams) of sodium each day. What  foods should I eat? Fruits Fresh, frozen, or canned fruit. Fruit juice. Vegetables Fresh or frozen vegetables. "No salt added" canned vegetables. "No salt added" tomato sauce and paste. Low-sodium or reduced-sodium tomato and vegetable juice. Grains Low-sodium cereals, including oats, puffed wheat and rice, and shredded wheat. Low-sodium crackers. Unsalted rice. Unsalted pasta. Low-sodium bread. Whole-grain breads and whole-grain pasta. Meats and other proteins Fresh or frozen (no salt added) meat, poultry, seafood, and fish. Low-sodium canned tuna and salmon. Unsalted nuts. Dried peas, beans, and lentils without added salt. Unsalted canned beans. Eggs. Unsalted nut butters. Dairy Milk. Soy milk. Cheese that is naturally low in sodium, such as ricotta cheese, fresh mozzarella, or Swiss cheese. Low-sodium or reduced-sodium cheese. Cream cheese. Yogurt. Seasonings and condiments Fresh and dried herbs and spices. Salt-free seasonings. Low-sodium mustard and ketchup. Sodium-free salad dressing. Sodium-free light mayonnaise. Fresh or refrigerated horseradish. Lemon juice. Vinegar. Other foods Homemade, reduced-sodium, or low-sodium soups. Unsalted popcorn and pretzels. Low-salt or salt-free chips. The items listed above may not be a complete list of foods and beverages you can eat. Contact a dietitian for more information. What foods should I avoid? Vegetables Sauerkraut, pickled vegetables, and relishes. Olives. [place] fries. Onion rings. Regular canned vegetables (not low-sodium or reduced-sodium). Regular canned tomato sauce and paste (not low-sodium or reduced-sodium). Regular tomato and vegetable juice (not low-sodium or reduced-sodium). Frozen vegetables in sauces. Grains Instant hot cereals. Bread stuffing, pancake, and biscuit mixes. Croutons. Seasoned rice or pasta mixes. Noodle soup cups. Boxed or frozen macaroni and cheese. Regular salted crackers. Self-rising flour. Meats and other  proteins Meat or fish that is salted, canned, smoked, spiced, or pickled. Precooked or cured meat, such as sausages or meat loaves. Berniece Salines. Ham. Pepperoni. Hot dogs. Corned beef. Chipped beef. Salt pork. Jerky. Pickled herring. Anchovies and sardines. Regular canned tuna. Salted nuts. Dairy Processed cheese and cheese spreads. Hard cheeses. Cheese curds. Blue cheese. Feta cheese. String cheese. Regular cottage cheese. Buttermilk. Canned milk. Fats and oils Salted butter. Regular margarine. Ghee. Bacon fat. Seasonings and condiments Onion salt, garlic salt, seasoned salt, table salt, and sea salt. Canned and packaged gravies. Worcestershire sauce. Tartar sauce. Barbecue sauce. Teriyaki sauce. Soy sauce, including reduced-sodium. Steak sauce. Fish sauce. Oyster sauce. Cocktail sauce. Horseradish that you find on the shelf. Regular ketchup and mustard. Meat flavorings and tenderizers. Bouillon cubes. Hot sauce. Pre-made or packaged marinades. Pre-made or packaged taco seasonings. Relishes. Regular salad dressings. Salsa. Other foods Salted popcorn and pretzels. Corn chips and puffs. Potato and tortilla chips. Canned or dried soups. Pizza. Frozen entrees and pot pies. The items listed above may not be a complete list of foods and beverages you should avoid. Contact a dietitian for more information. Summary Eating less sodium can help lower your blood pressure, reduce swelling, and protect your heart, liver, and kidneys. Most people on this plan should limit their sodium intake to 1,500-2,000 mg (milligrams) of sodium each day. Canned, boxed, and frozen foods are high in sodium. Restaurant foods, fast foods, and pizza are also very high in sodium. You also get sodium by adding salt to food. Try  to cook at home, eat more fresh fruits and vegetables, and eat less fast food and canned, processed, or prepared foods. This information is not intended to replace advice given to you by your health care provider.  Make sure you discuss any questions you have with your health care provider. Document Revised: 08/26/2019 Document Reviewed: 06/22/2019 Elsevier Patient Education  2022 [place].    For your  leg edema you  should do  the following 1. Leg elevation - I recommend the Lounge Dr. Leg rest.  See below for details  2. Salt restriction  -  Use potassium chloride instead of regular salt as a salt substitute. 3. Walk regularly 4. Compression hose - [employer] store  5. Weight loss    Available on [email or web address] Or  Go to [email or web address]

## 2021-09-25 NOTE — Assessment & Plan Note (Signed)
Blood pressure is uncontrolled.  She has been taking hydralazine as needed quite often.  We discussed changing this to twice daily every day.  However, she is interested in trying to get her heart rate slower.  She is willing to try carvedilol again.  Start carvedilol 3.125 mg twice daily.  Of note, she has tolerated taking sublingual nitroglycerin in the past.  If her blood pressure continues to remain uncontrolled, she would be willing to try isosorbide.  Follow-up in 3 months.

## 2021-09-25 NOTE — Assessment & Plan Note (Signed)
History of anterior STEMI in 2019 treated with a DES to the LAD.  Cardiac catheterization in October demonstrated patent LAD stent.  She had moderate LAD, diagonal and RCA disease.  The LAD lesion was borderline significant by RFR.  Medical therapy has been recommended and PCI could be considered for refractory angina.  She has been doing well without recurrent anginal symptoms.  Of note, she does not take aspirin.  She notes that she has had difficulty with tolerating this due to gastric upset as well as potential bleeding.  She does note that she tolerated Brilinta well when she took it after her myocardial infarction.  She would be willing to try low-dose Brilinta again.  Start Brilinta 60 mg twice daily.  Start carvedilol 3.125 mg twice daily as noted.  As noted, we will follow-up on approval for inclisiran.

## 2021-09-25 NOTE — Assessment & Plan Note (Signed)
She has been intolerant to multiple medications.  I will follow-up with Dr. Debara Pickett and our Pharm.D. clinic to see if she has been approved for inclisiran.

## 2021-09-26 ENCOUNTER — Telehealth: Payer: Self-pay | Admitting: Physician Assistant

## 2021-09-26 DIAGNOSIS — Z79899 Other long term (current) drug therapy: Secondary | ICD-10-CM

## 2021-09-26 DIAGNOSIS — R600 Localized edema: Secondary | ICD-10-CM

## 2021-09-26 LAB — BASIC METABOLIC PANEL
BUN/Creatinine Ratio: 36 — ABNORMAL HIGH (ref 12–28)
BUN: 31 mg/dL — ABNORMAL HIGH (ref 8–27)
CO2: 27 mmol/L (ref 20–29)
Calcium: 9.1 mg/dL (ref 8.7–10.3)
Chloride: 97 mmol/L (ref 96–106)
Creatinine, Ser: 0.85 mg/dL (ref 0.57–1.00)
Glucose: 104 mg/dL — ABNORMAL HIGH (ref 70–99)
Potassium: 3.7 mmol/L (ref 3.5–5.2)
Sodium: 139 mmol/L (ref 134–144)
eGFR: 72 mL/min/{1.73_m2} (ref >59)

## 2021-09-26 LAB — PRO B NATRIURETIC PEPTIDE: NT-Pro BNP: 817 pg/mL — ABNORMAL HIGH (ref 0–301)

## 2021-09-26 LAB — TSH: TSH: 1.47 u[IU]/mL (ref 0.450–4.500)

## 2021-09-26 MED ORDER — FUROSEMIDE 40 MG PO TABS: 60.0000 mg | ORAL_TABLET | Freq: Every day | ORAL | 3 refills | Status: DC

## 2021-09-26 NOTE — Telephone Encounter (Signed)
° °  Pt is returning call to get lab result °

## 2021-09-26 NOTE — Telephone Encounter (Signed)
Called and spoke to patient regarding results and recommendations. She wishes to use current tablets she has on hand for Furosemide and verbalizes that she will take 1.5 tablets total to equate 60mg , then will pick up new prescription from pharmacy. Repeat blood work CMET has been ordered for 3/3. Pt still expresses that the BLE swelling she discussed yesterday with Nicki Reaper is problematic, but states that she was able to move around better this morning and does have ACE wraps on her legs as advised.

## 2021-09-26 NOTE — Telephone Encounter (Signed)
-----   Message from Liliane Shi, [place] sent at 09/26/2021  8:06 AM EST ----- Creatinine, K+, TSH normal. BUN increased.  BNP mildly elevated.   She was supposed to get LFTs done as well. PLAN:  -Decrease Furosemide to 60 mg once daily  -Add LFTs to blood in lab -BMET 1 week (order CMET if LFTs cannot be added to blood in lab) -Continue other medications as directed Richardson Dopp, PA-C    09/26/2021 8:01 AM

## 2021-10-02 ENCOUNTER — Ambulatory Visit (HOSPITAL_COMMUNITY): Admission: RE | Admit: 2021-10-02 | Payer: Medicare Other | Source: Home / Self Care | Admitting: Orthopedic Surgery

## 2021-10-02 SURGERY — KYPHOPLASTY
Anesthesia: General

## 2021-10-04 ENCOUNTER — Other Ambulatory Visit: Payer: Medicare Other | Admitting: *Deleted

## 2021-10-04 ENCOUNTER — Other Ambulatory Visit: Payer: Self-pay

## 2021-10-04 DIAGNOSIS — Z79899 Other long term (current) drug therapy: Secondary | ICD-10-CM

## 2021-10-04 DIAGNOSIS — R600 Localized edema: Secondary | ICD-10-CM

## 2021-10-05 LAB — COMPREHENSIVE METABOLIC PANEL
ALT: 41 IU/L — ABNORMAL HIGH (ref 0–32)
AST: 23 IU/L (ref 0–40)
Albumin/Globulin Ratio: 1.9 (ref 1.2–2.2)
Albumin: 4.1 g/dL (ref 3.7–4.7)
Alkaline Phosphatase: 107 IU/L (ref 44–121)
BUN/Creatinine Ratio: 32 — ABNORMAL HIGH (ref 12–28)
BUN: 29 mg/dL — ABNORMAL HIGH (ref 8–27)
Bilirubin Total: 0.2 mg/dL (ref 0.0–1.2)
CO2: 23 mmol/L (ref 20–29)
Calcium: 8.7 mg/dL (ref 8.7–10.3)
Chloride: 95 mmol/L — ABNORMAL LOW (ref 96–106)
Creatinine, Ser: 0.9 mg/dL (ref 0.57–1.00)
Globulin, Total: 2.2 g/dL (ref 1.5–4.5)
Glucose: 144 mg/dL — ABNORMAL HIGH (ref 70–99)
Potassium: 3.7 mmol/L (ref 3.5–5.2)
Sodium: 136 mmol/L (ref 134–144)
Total Protein: 6.3 g/dL (ref 6.0–8.5)
eGFR: 67 mL/min/{1.73_m2} (ref >59)

## 2021-10-23 ENCOUNTER — Ambulatory Visit: Payer: Medicare Other | Admitting: Nurse Practitioner

## 2021-10-27 NOTE — Progress Notes (Signed)
? ? ? ?10/27/2021 ?[place] ?[id number] ?[birth year] ? ? ?Chief Complaint: Elevated LFTs ? ?History of Present Illness: Donna Bailey is a 76 year old female with a past medical history of anxiety, fibromyalgia, asthma, hypertension, coronary artery disease s/p STEMI s/p DES to the LAD 11/2017 on Brilinta, ischemic cardiomyopathy, LV EF 40 - 45%, CKD stage II, cerebral aneurysm, Barrett's esophagus, adenomatous colon polyps and suspected pancreatic insufficiency.  ? ?She presents to our office today for further evaluation regarding elevated LFTs. She is accompanied by her husband. She is known by [name]. She is concerned her elevated ALT level of 41 is possibly due to dental issues. She stated she researched this issue and wanted to know more about it. She thought she read online that Dr. Havery Moros in our office researched this area and she wanted to know if she could schedule an appointment to see him. She stated her teeth have multiple crowns which can result in liver or pancreas issues. I informed Donna Bailey that I was unaware of any research indicating dental issues causing liver or pancreas issues. I was able to speak with Dr. Havery Moros and he was unable to support her research and was unaware of any dental issues which could cause liver or pancreas issues. I advised the patient to bring in a copy of the literature read and I will gladly review it.  ? ?Her ALT level 14 Dec. 2022 and ALT level was 71 on 10/04/2021. I discussed her ALT level has increased but the current level was not alarming. She underwent an abd/pelvic CT with contrast 06/20/2021 which showed a normal live which is reassuring. She is on numerous medications which could potentially increase her LFTs, I recommended monitoring her LFTs for now.  ? ?She denies having any GERD symptoms. Her most recent EGD was 01/15/2021 which identified Barrett's esophagus with intestinal metaplasia without dysplasia.  She remains on Esomeprazole 40  mg p.o. twice daily. ? ?She is passing 2 small soft stool daily. Sometimes she feels constipated. No further diarrhea. She is taking a digestive enzyme 2 to 3 caps daily. CTAP 06/2021 showed fatty infiltration to the pancreas/pancreatic atrophy. Her most recent colonoscopy by [name] was 01/15/2021 which identified 2 small polyps removed from the rectum and descending colon, no evidence of colitis.  ? ?She was admitted to the hospital 05/23/2021 with chest pain. She underwent a cardiac cath 05/27/2021 which showed moderate LAD, diagonal, RCA disease. Question vascular spasm distal to previously placed stent.  She was prescribed Cardizem 120 mg daily with instructions to continue Aspirin indefinitely, discharged home 05/27/2021. No CP or SOB at this time.  ? ? ?  Latest Ref Rng & Units 07/15/2021  ? 10:47 AM 05/26/2021  ?  2:10 AM 05/23/2021  ?  5:32 PM  ?CBC  ?WBC 4.0 - 10.5 K/uL 9.9   8.6   12.4    ?Hemoglobin 12.0 - 15.0 g/dL 12.3   12.2   14.2    ?Hematocrit 36.0 - 46.0 % 38.4   37.9   46.5    ?Platelets 150.0 - 400.0 K/uL 206.0   336   361    ?  ? ?  Latest Ref Rng & Units 10/04/2021  ? 12:27 PM 09/25/2021  ? 10:34 AM 07/15/2021  ? 10:47 AM  ?CMP  ?Glucose 70 - 99 mg/dL 144   104   83    ?BUN 8 - 27 mg/dL '29   31   21    '$ ?  Creatinine 0.57 - 1.00 mg/dL 0.90   0.85   0.85    ?Sodium 134 - 144 mmol/L 136   139   140    ?Potassium 3.5 - 5.2 mmol/L 3.7   3.7   3.9    ?Chloride 96 - 106 mmol/L 95   97   105    ?CO2 20 - 29 mmol/L '23   27   27    '$ ?Calcium 8.7 - 10.3 mg/dL 8.7   9.1   9.1    ?Total Protein 6.0 - 8.5 g/dL 6.3    6.5    ?Total Bilirubin 0.0 - 1.2 mg/dL 0.2    0.4    ?Alkaline Phos 44 - 121 IU/L 107    59    ?AST 0 - 40 IU/L 23    11    ?ALT 0 - 32 IU/L 41    14    ?  ?CT ABDOMEN AND PELVIS WITH CONTRAST 06/20/2021: ?  ?TECHNIQUE: ?Multidetector CT imaging of the abdomen and pelvis was performed ?using the standard protocol following bolus administration of ?intravenous contrast. ?  ?CONTRAST:  166m  ISOVUE-300 IOPAMIDOL (ISOVUE-300) INJECTION 61% ?  ?COMPARISON:  CT examination dated June 11, 2017 ?  ?FINDINGS: ?Lower chest: No acute abnormality. ?  ?Hepatobiliary: No focal liver abnormality is seen. No gallstones, ?gallbladder wall thickening, or biliary dilatation. ?  ?Pancreas: Fatty infiltration of the pancreas. No pancreatic ductal ?dilatation or surrounding inflammatory changes. ?  ?Spleen: Normal in size without focal abnormality. ?  ?Adrenals/Urinary Tract: Adrenal glands are unremarkable. Kidneys are ?normal, without renal calculi, focal lesion, or hydronephrosis. ?Bladder is unremarkable. ?  ?Stomach/Bowel: Moderate hiatal hernia. Stomach is unremarkable. ?Small bowel loops are normal in caliber. Appendix not visualized. No ?bowel wall thickening or appreciable mass. No evidence of colitis or ?diverticulitis. ?  ?Vascular/Lymphatic: Aortic atherosclerosis. No enlarged abdominal or ?pelvic lymph nodes. ?  ?Reproductive: Status post hysterectomy. No adnexal masses. ?  ?Other: No abdominal wall hernia or abnormality. No abdominopelvic ?ascites. ?  ?Musculoskeletal: Degenerative disc disease of lumbar spine prominent ?at L4-L5 with mild anterolisthesis. Facet joint arthropathy in the ?lower lumbar spine. No acute osseous abnormality. ?  ?IMPRESSION: ?1.  No CT evidence of acute abdominal/pelvic process. ?  ?2.  No bowel wall thickening, colitis or diverticulitis. ?  ?3.  Mild pancreatic atrophy. ?  ?4.  Moderate hiatal hernia. ?  ? ?Past Medical History:  ?Diagnosis Date  ? Allergic rhinitis, cause unspecified   ? Anemia   ? Aneurysm of right conjunctiva   ? right eye   ? Anxiety   ? Asthma   ? Barrett's esophagus   ? CAD in native artery   ? a. 11/2017: STEMI s/p DES to prox-mid LAD; LCx stenosis managed medically. Case complicated by R forearm hematoma. b. Several subsequent caths, last in 04/2019 with stable disease // Myoview 11/2019: EF 61, normal perfusion; low risk   ? CKD (chronic kidney disease),  stage II   ? Constipation   ? Cystocele   ? Deviated nasal septum   ? Diaphragmatic hernia without mention of obstruction or gangrene   ? Esophageal reflux   ? Fibromyalgia   ? H/O hiatal hernia   ? Hypertension   ? Insomnia, unspecified   ? Ischemic cardiomyopathy   ? a. EF 40-45% by echo 11/2017. // Echo 5/19: No new wall motion abnormalities, EF 65, no pericardial effusion, normal aortic root  ? Kidney stones   ? hx of  pt see Dr. Risa Grill  ? Medication intolerance   ? numerous  ? Migraine   ? Myalgia and myositis, unspecified   ? Myocardial infarct ([place]) 11/13/2017  ? Myocardial infarction [facility])   ? Nuclear stress tests   ? Cardiolite 2/18: no ischemia or scar, EF 78; Low Risk  ? Osteoarthrosis, unspecified whether generalized or localized, unspecified site   ? Peripheral neuropathy   ? Pneumonia   ? hx of  ? Pure hypercholesterolemia   ? Rectocele   ? Scoliosis (and kyphoscoliosis), idiopathic   ? Statin intolerance   ? Temporomandibular joint disorders, unspecified   ? Urticaria   ? Wheat allergy   ? ?Current Outpatient Medications on File Prior to Visit  ?Medication Sig Dispense Refill  ? acetic acid-hydrocortisone (VOSOL-HC) OTIC solution 5 drops into affected ear    ? amitriptyline (ELAVIL) 25 MG tablet Take by mouth.    ? Ascorbic Acid (VITAMIN C) 100 MG tablet Take 100 mg by mouth daily.    ? cyanocobalamin (,VITAMIN B-12,) 1000 MCG/ML injection 1,000 mLs (1,000,000 mcg total).    ? Cyanocobalamin (VITAMIN B-12 IJ) Inject 1,000 mg as directed every 30 (thirty) days.    ? diphenhydrAMINE (BENADRYL) 25 mg capsule Take by mouth.    ? esomeprazole (NEXIUM) 40 MG capsule TAKE 1 CAPSULE BY MOUTH TWICE A DAY 180 capsule 0  ? furosemide (LASIX) 40 MG tablet Take 1.5 tablets (60 mg total) by mouth daily. 135 tablet 3  ? Gabapentin 10 % CREA Apply 1-2 application topically at bedtime.    ? hydrALAZINE (APRESOLINE) 25 MG tablet Take 1 tablet (25 mg total) by mouth daily as needed (for BP greater than 854 systolic). 30  tablet 3  ? hydrOXYzine (VISTARIL) 25 MG capsule Take 50 mg by mouth every 8 (eight) hours as needed for anxiety.     ? inclisiran (LEQVIO) 284 MG/1.5ML SOSY injection 284 mg. Administered at day one of trea

## 2021-10-30 ENCOUNTER — Ambulatory Visit (INDEPENDENT_AMBULATORY_CARE_PROVIDER_SITE_OTHER): Payer: Medicare Other | Admitting: Nurse Practitioner

## 2021-10-30 ENCOUNTER — Encounter: Payer: Self-pay | Admitting: Nurse Practitioner

## 2021-10-30 VITALS — BP 140/80 | HR 82 | Ht 66.0 in | Wt 196.2 lb

## 2021-10-30 DIAGNOSIS — R7989 Other specified abnormal findings of blood chemistry: Secondary | ICD-10-CM

## 2021-10-30 DIAGNOSIS — M79642 Pain in left hand: Secondary | ICD-10-CM | POA: Insufficient documentation

## 2021-10-30 NOTE — Patient Instructions (Signed)
RECOMMENDATIONS: ? ?Reduce carbohydrate intake. ?Repeat labs in 4 weeks, you do not need an appointment for the lab. ? ? ?Thank you for trusting me with your gastrointestinal care!   ? ?Noralyn Pick, CRNP ? ? ? ?BMI: ? ?If you are age 75 or older, your body mass index should be between 23-30. Your Body mass index is 31.68 kg/m?Marland Kitchen If this is out of the aforementioned range listed, please consider follow up with your Primary Care Provider. ? ?If you are age 50 or younger, your body mass index should be between 19-25. Your Body mass index is 31.68 kg/m?Marland Kitchen If this is out of the aformentioned range listed, please consider follow up with your Primary Care Provider.  ? ?MY CHART: ? ?The [place] providers would like to encourage you to use [facility] to communicate with providers for non-urgent requests or questions.  Due to long hold times on the telephone, sending your provider a message by [facility] may be a faster and more efficient way to get a response.  Please allow 48 business hours for a response.  Please remember that this is for non-urgent requests.  ? ?

## 2021-10-30 NOTE — Progress Notes (Signed)
Noted  
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
## 2021-10-31 ENCOUNTER — Ambulatory Visit: Payer: Medicare Other | Attending: Family Medicine | Admitting: Physical Therapy

## 2021-10-31 ENCOUNTER — Encounter: Payer: Self-pay | Admitting: Physical Therapy

## 2021-10-31 DIAGNOSIS — M6281 Muscle weakness (generalized): Secondary | ICD-10-CM | POA: Diagnosis present

## 2021-10-31 DIAGNOSIS — R2689 Other abnormalities of gait and mobility: Secondary | ICD-10-CM | POA: Diagnosis present

## 2021-10-31 DIAGNOSIS — M5459 Other low back pain: Secondary | ICD-10-CM | POA: Insufficient documentation

## 2021-11-03 ENCOUNTER — Other Ambulatory Visit: Payer: Self-pay

## 2021-11-03 DIAGNOSIS — I998 Other disorder of circulatory system: Secondary | ICD-10-CM

## 2021-11-04 ENCOUNTER — Ambulatory Visit (HOSPITAL_COMMUNITY)
Admission: RE | Admit: 2021-11-04 | Discharge: 2021-11-04 | Disposition: A | Discharge status: Home / Self Care | Payer: Medicare Other | Source: Ambulatory Visit | Attending: Vascular Surgery | Admitting: Vascular Surgery

## 2021-11-04 DIAGNOSIS — I998 Other disorder of circulatory system: Secondary | ICD-10-CM | POA: Insufficient documentation

## 2021-11-06 ENCOUNTER — Ambulatory Visit (INDEPENDENT_AMBULATORY_CARE_PROVIDER_SITE_OTHER): Payer: Medicare Other | Admitting: Vascular Surgery

## 2021-11-06 ENCOUNTER — Other Ambulatory Visit: Payer: Self-pay

## 2021-11-06 ENCOUNTER — Ambulatory Visit: Payer: Medicare Other | Admitting: Physical Therapy

## 2021-11-06 ENCOUNTER — Encounter: Payer: Self-pay | Admitting: Vascular Surgery

## 2021-11-06 VITALS — BP 191/92 | HR 75 | Temp 98.2°F | Resp 20 | Ht 66.0 in | Wt 196.0 lb

## 2021-11-06 DIAGNOSIS — I998 Other disorder of circulatory system: Secondary | ICD-10-CM
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
## 2021-11-07 NOTE — Telephone Encounter (Signed)
Leqvio FTO sample has been secure and is available for administration to patient when she is able. MyChart message sent about this. Nurse visit will need to be arranged.  ?

## 2021-11-08 ENCOUNTER — Emergency Department (HOSPITAL_COMMUNITY): Payer: Medicare Other

## 2021-11-08 ENCOUNTER — Emergency Department (HOSPITAL_COMMUNITY)
Admission: EM | Admit: 2021-11-08 | Discharge: 2021-11-08 | Disposition: A | Discharge status: Home / Self Care | Payer: Medicare Other | Attending: Emergency Medicine | Admitting: Emergency Medicine

## 2021-11-08 ENCOUNTER — Encounter (HOSPITAL_COMMUNITY): Payer: Self-pay | Admitting: Emergency Medicine

## 2021-11-08 DIAGNOSIS — E876 Hypokalemia: Secondary | ICD-10-CM | POA: Insufficient documentation

## 2021-11-08 DIAGNOSIS — Z91040 Latex allergy status: Secondary | ICD-10-CM | POA: Insufficient documentation

## 2021-11-08 DIAGNOSIS — R110 Nausea: Secondary | ICD-10-CM

## 2021-11-08 DIAGNOSIS — I2510 Atherosclerotic heart disease of native coronary artery without angina pectoris: Secondary | ICD-10-CM | POA: Insufficient documentation

## 2021-11-08 DIAGNOSIS — R1084 Generalized abdominal pain: Secondary | ICD-10-CM

## 2021-11-08 DIAGNOSIS — N182 Chronic kidney disease, stage 2 (mild): Secondary | ICD-10-CM | POA: Insufficient documentation

## 2021-11-08 DIAGNOSIS — I1310 Hypertensive heart and chronic kidney disease without heart failure, with stage 1 through stage 4 chronic kidney disease, or unspecified chronic kidney disease: Secondary | ICD-10-CM | POA: Insufficient documentation

## 2021-11-08 DIAGNOSIS — R109 Unspecified abdominal pain: Secondary | ICD-10-CM | POA: Diagnosis present

## 2021-11-08 LAB — COMPREHENSIVE METABOLIC PANEL
ALT: 22 U/L (ref 0–44)
AST: 15 U/L (ref 15–41)
Albumin: 3.4 g/dL — ABNORMAL LOW (ref 3.5–5.0)
Alkaline Phosphatase: 76 U/L (ref 38–126)
Anion gap: 9 (ref 5–15)
BUN: 17 mg/dL (ref 8–23)
CO2: 25 mmol/L (ref 22–32)
Calcium: 8.6 mg/dL — ABNORMAL LOW (ref 8.9–10.3)
Chloride: 106 mmol/L (ref 98–111)
Creatinine, Ser: 0.83 mg/dL (ref 0.44–1.00)
GFR, Estimated: >60 mL/min (ref >60)
Glucose, Bld: 101 mg/dL — ABNORMAL HIGH (ref 70–99)
Potassium: 3.1 mmol/L — ABNORMAL LOW (ref 3.5–5.1)
Sodium: 140 mmol/L (ref 135–145)
Total Bilirubin: 0.5 mg/dL (ref 0.3–1.2)
Total Protein: 6.3 g/dL — ABNORMAL LOW (ref 6.5–8.1)

## 2021-11-08 LAB — URINALYSIS, ROUTINE W REFLEX MICROSCOPIC
Bilirubin Urine: NEGATIVE
Glucose, UA: NEGATIVE mg/dL
Hgb urine dipstick: NEGATIVE
Ketones, ur: NEGATIVE mg/dL
Leukocytes,Ua: NEGATIVE
Nitrite: NEGATIVE
Protein, ur: NEGATIVE mg/dL
Specific Gravity, Urine: 1.013 (ref 1.005–1.030)
pH: 7 (ref 5.0–8.0)

## 2021-11-08 LAB — CBC
HCT: 38.1 % (ref 36.0–46.0)
Hemoglobin: 12.8 g/dL (ref 12.0–15.0)
MCH: 30.8 pg (ref 26.0–34.0)
MCHC: 33.6 g/dL (ref 30.0–36.0)
MCV: 91.6 fL (ref 80.0–100.0)
Platelets: 310 10*3/uL (ref 150–400)
RBC: 4.16 MIL/uL (ref 3.87–5.11)
RDW: 18.3 % — ABNORMAL HIGH (ref 11.5–15.5)
WBC: 10.9 10*3/uL — ABNORMAL HIGH (ref 4.0–10.5)
nRBC: 0 % (ref 0.0–0.2)

## 2021-11-08 LAB — LIPASE, BLOOD: Lipase: 28 U/L (ref 11–51)

## 2021-11-08 MED ORDER — ONDANSETRON HCL 4 MG/2ML IJ SOLN: 4.0000 mg | Freq: Once | INTRAMUSCULAR | Status: DC

## 2021-11-08 MED ORDER — SODIUM CHLORIDE 0.9 % IV BOLUS
500.0000 mL | Freq: Once | INTRAVENOUS | Status: AC
  Administered 2021-11-08: 500 mL via INTRAVENOUS

## 2021-11-08 MED ORDER — IOHEXOL 300 MG/ML  SOLN
100.0000 mL | Freq: Once | INTRAMUSCULAR | Status: AC | PRN
  Administered 2021-11-08: 100 mL via INTRAVENOUS

## 2021-11-08 MED ORDER — POTASSIUM CHLORIDE CRYS ER 20 MEQ PO TBCR
40.0000 meq | EXTENDED_RELEASE_TABLET | Freq: Once | ORAL | Status: AC
Start: 2021-11-08 — End: 2021-11-08
  Administered 2021-11-08: 40 meq via ORAL
  Filled 2021-11-08: qty 2

## 2021-11-08 MED ORDER — KETOROLAC TROMETHAMINE 15 MG/ML IJ SOLN
15.0000 mg | Freq: Once | INTRAMUSCULAR | Status: AC
  Administered 2021-11-08: 15 mg via INTRAVENOUS
  Filled 2021-11-08: qty 1

## 2021-11-08 MED ORDER — SODIUM CHLORIDE (PF) 0.9 % IJ SOLN
INTRAMUSCULAR | Status: AC
  Filled 2021-11-08: qty 50

## 2021-11-08 NOTE — ED Triage Notes (Signed)
Patient reports ongoing abdominal pain with worsening pain for months. Hx pancreatitis and fatty liver disease. Reports decreased appetite.  ?

## 2021-11-08 NOTE — ED Provider Notes (Signed)
?[place] DEPT ?Provider Note ? ? ?CSN: [id number] ?Arrival date & time: 11/08/21  4098 ? ?  ? ?History ? ?Chief Complaint  ?Patient presents with  ? Abdominal Pain  ? ? ?Donna Bailey is a 76 y.o. female who presents the emergency department complaining of ongoing abdominal pain for several months.  She reports a history of pancreatitis and fatty liver disease, noise attributed her pain to 1 of these.  States in the past 2 days she has had decreased appetite and felt nauseous when she is going to drink water. ? ? ?Abdominal Pain ?Associated symptoms: chills and nausea   ?Associated symptoms: no chest pain, no constipation, no diarrhea, no dysuria, no fever, no hematuria, no shortness of breath and no vomiting   ? ?  ? ?Home Medications ?Prior to Admission medications   ?Medication Sig Start Date End Date Taking? Authorizing Provider  ?acetic acid-hydrocortisone (VOSOL-HC) OTIC solution 5 drops into affected ear 08/08/21   [provider]  ?amitriptyline (ELAVIL) 25 MG tablet Take by mouth.    [provider]  ?Ascorbic Acid (VITAMIN C) 100 MG tablet Take 100 mg by mouth daily.    [provider]  ?carvedilol (COREG) 3.125 MG tablet Take 1 tablet (3.125 mg total) by mouth 2 (two) times daily. ?Patient not taking: Reported on 10/30/2021 09/25/21 09/25/22  Richardson Dopp T, PA-C  ?cyanocobalamin (,VITAMIN B-12,) 1000 MCG/ML injection 1,000 mLs (1,000,000 mcg total).    [provider]  ?Cyanocobalamin (VITAMIN B-12 IJ) Inject 1,000 mg as directed every 30 (thirty) days.    [provider]  ?diphenhydrAMINE (BENADRYL) 25 mg capsule Take by mouth.    [provider]  ?esomeprazole (NEXIUM) 40 MG capsule TAKE 1 CAPSULE BY MOUTH TWICE A DAY 08/14/21   Zehr, Janett Billow D, PA-C  ?furosemide (LASIX) 40 MG tablet Take 1.5 tablets (60 mg total) by mouth daily. 09/26/21   Richardson Dopp T, PA-C  ?Gabapentin 10 % CREA Apply 1-2 application topically at  bedtime.    [provider]  ?hydrALAZINE (APRESOLINE) 25 MG tablet Take 1 tablet (25 mg total) by mouth daily as needed (for BP greater than 119 systolic). 09/11/20   Dorothy Spark, MD  ?hydrOXYzine (VISTARIL) 25 MG capsule Take 50 mg by mouth every 8 (eight) hours as needed for anxiety.  05/30/19   [provider]  ?inclisiran (LEQVIO) 284 MG/1.5ML SOSY injection 284 mg. Administered at day one of treatment, then at 3 months and then every 6 months thereafter.    [provider]  ?lidocaine (LIDODERM) 5 % Place 0.5 patches onto the skin daily as needed (pain). 11/04/20   [provider]  ?MAGNESIUM PO Take 1 tablet by mouth at bedtime.    [provider]  ?nitroGLYCERIN (NITROSTAT) 0.4 MG SL tablet Place 1 tablet (0.4 mg total) under the tongue every 5 (five) minutes as needed for chest pain. 11/16/17   Lyda Jester M, PA-C  ?ondansetron (ZOFRAN) 4 MG tablet Take 1 tablet (4 mg total) by mouth every 8 (eight) hours as needed for nausea or vomiting. 11/28/20   Zehr, Laban Emperor, PA-C  ?promethazine-codeine (PHENERGAN WITH CODEINE) 6.25-10 MG/5ML syrup Take 5 mLs by mouth every 6 (six) hours as needed for cough. 09/03/21   Baird Lyons D, MD  ?triamcinolone ointment (KENALOG) 0.1 % Apply 1 application topically 2 (two) times daily as needed (dry/itchy skin). 12/04/20   [provider]  ?zolpidem (AMBIEN) 10 MG tablet Take 1 tablet (  10 mg total) by mouth at bedtime as needed for sleep. 09/03/21 10/03/21  Deneise Lever, MD  ?   ? ?Allergies    ?Cephalosporins, Crestor [rosuvastatin], Doxycycline, Formoterol, Other, Sulfa antibiotics, Sulfonamide derivatives, Ciprofloxacin, Nitrofurantoin, Penicillins, Prednisone, Amlodipine, Brilinta [ticagrelor], Carvedilol, Cephalexin, Dilt-xr [diltiazem hcl], Formoterol fumarate, Fosfomycin, Gold sodium thiosulfate, Gold-containing drug products, Hydralazine, Levofloxacin in d5w, Macrodantin [nitrofurantoin macrocrystal],  Moxifloxacin, Neomycin, Ofloxacin, Praluent [alirocumab], Pregabalin, Ranexa [ranolazine er], Repatha [evolocumab], Valsartan, Welchol [colesevelam], Zetia [ezetimibe], Zolpidem tartrate, Acetaminophen, Fexofenadine, Ibuprofen, Latex, Levofloxacin, Mold extract [trichophyton mentagrophyte], Molds & smuts, Nickel, Tape, and Trichophyton   ? ?Review of Systems   ?Review of Systems  ?Constitutional:  Positive for appetite change and chills. Negative for fever.  ?Respiratory:  Negative for shortness of breath.   ?Cardiovascular:  Negative for chest pain.  ?Gastrointestinal:  Positive for abdominal pain and nausea. Negative for constipation, diarrhea and vomiting.  ?Genitourinary:  Negative for dysuria, frequency, hematuria and urgency.  ?All other systems reviewed and are negative. ? ?Physical Exam ?Updated Vital Signs ?BP (!) 172/61   Pulse 67   Temp 98.1 ?F (36.7 ?C) (Oral)   Resp 18   SpO2 97%  ?Physical Exam ?Vitals and nursing note reviewed.  ?Constitutional:   ?   Appearance: Normal appearance.  ?HENT:  ?   Head: Normocephalic and atraumatic.  ?Eyes:  ?   Conjunctiva/sclera: Conjunctivae normal.  ?Cardiovascular:  ?   Rate and Rhythm: Normal rate and regular rhythm.  ?Pulmonary:  ?   Effort: Pulmonary effort is normal. No respiratory distress.  ?   Breath sounds: Normal breath sounds.  ?Abdominal:  ?   General: There is no distension.  ?   Palpations: Abdomen is soft.  ?   Tenderness: There is generalized abdominal tenderness. There is no right CVA tenderness, left CVA tenderness, guarding or rebound.  ?Skin: ?   General: Skin is warm and dry.  ?Neurological:  ?   General: No focal deficit present.  ?   Mental Status: She is alert.  ? ? ?ED Results / Procedures / Treatments   ?Labs ?(all labs ordered are listed, but only abnormal results are displayed) ?Labs Reviewed  ?COMPREHENSIVE METABOLIC PANEL - Abnormal; Notable for the following components:  ?    Result Value  ? Potassium 3.1 (*)   ? Glucose, Bld 101  (*)   ? Calcium 8.6 (*)   ? Total Protein 6.3 (*)   ? Albumin 3.4 (*)   ? All other components within normal limits  ?CBC - Abnormal; Notable for the following components:  ? WBC 10.9 (*)   ? RDW 18.3 (*)   ? All other components within normal limits  ?LIPASE, BLOOD  ?URINALYSIS, ROUTINE W REFLEX MICROSCOPIC  ? ? ?EKG ?EKG Interpretation ? ?Date/Time:  Friday November 08 2021 13:20:39 EDT ?Ventricular Rate:  72 ?PR Interval:  151 ?QRS Duration: 90 ?QT Interval:  392 ?QTC Calculation: 429 ?R Axis:   56 ?Text Interpretation: Sinus rhythm Minimal ST depression, diffuse leads overall similar to Oct 2022 Confirmed by Sherwood Gambler [phone]) on 11/08/2021 1:26:18 PM ? ?Radiology ?CT ABDOMEN PELVIS W CONTRAST ? ?Result Date: 11/08/2021 ?CLINICAL DATA:  Abdominal pain, pancreatitis, history of fatty liver disease EXAM: CT ABDOMEN AND PELVIS WITH CONTRAST TECHNIQUE: Multidetector CT imaging of the abdomen and pelvis was performed using the standard protocol following bolus administration of intravenous contrast. RADIATION DOSE REDUCTION: This exam was performed according to the departmental dose-optimization program which includes automated exposure control, adjustment of  the mA and/or kV according to patient size and/or use of iterative reconstruction technique. CONTRAST:  187m OMNIPAQUE IOHEXOL 300 MG/ML  SOLN COMPARISON:  09/23/2021 FINDINGS: Lower chest: No acute abnormality. Left and right coronary artery calcifications. Small hiatal hernia. Hepatobiliary: No solid liver abnormality is seen. No gallstones, gallbladder wall thickening, or biliary dilatation. Pancreas: Unremarkable. No pancreatic ductal dilatation or surrounding inflammatory changes. Spleen: Normal in size without significant abnormality. Adrenals/Urinary Tract: Adrenal glands are unremarkable. Kidneys are normal, without renal calculi, solid lesion, or hydronephrosis. Bladder is unremarkable. Stomach/Bowel: Stomach is within normal limits. Appendix is not  clearly visualized and may be surgically absent. No evidence of bowel wall thickening, distention, or inflammatory changes. Occasional sigmoid diverticula. Vascular/Lymphatic: Aortic atherosclerosis. No enlarged ab

## 2021-11-08 NOTE — Discharge Instructions (Addendum)
You were seen in the emergency department for abdominal pain. ? ?As we discussed, your lab work and imaging looked pretty good. Your potassium level was slightly low so we gave you some replacement. ? ?I want you to follow up with your gastroenterologist to talk about your persistent pain.  ? ?Continue to monitor how you're doing and return to the ER for new or worsening symptoms.  ?

## 2021-11-08 NOTE — ED Notes (Signed)
Pt states understanding of dc instructions, importance of follow up. Pt denies questions or concerns and declined transportation assistance upon dc. Pt ambulated w/ a steady gait w/o need for assistance. No belongings left in room upon dc. ? ?

## 2021-11-18 ENCOUNTER — Encounter: Payer: Self-pay | Admitting: Vascular Surgery

## 2021-11-18 ENCOUNTER — Ambulatory Visit (HOSPITAL_COMMUNITY)
Admission: RE | Admit: 2021-11-18 | Discharge: 2021-11-18 | Disposition: A | Discharge status: Home / Self Care | Payer: Medicare Other | Attending: Vascular Surgery | Admitting: Vascular Surgery

## 2021-11-18 ENCOUNTER — Other Ambulatory Visit: Payer: Self-pay

## 2021-11-18 DIAGNOSIS — Z539 Procedure and treatment not carried out, unspecified reason: Secondary | ICD-10-CM | POA: Diagnosis present

## 2021-11-18 DIAGNOSIS — I70208 Unspecified atherosclerosis of native arteries of extremities, other extremity: Secondary | ICD-10-CM | POA: Diagnosis not present

## 2021-11-18 DIAGNOSIS — Z7982 Long term (current) use of aspirin: Secondary | ICD-10-CM | POA: Insufficient documentation

## 2021-11-18 DIAGNOSIS — Z7902 Long term (current) use of antithrombotics/antiplatelets: Secondary | ICD-10-CM | POA: Diagnosis not present

## 2021-11-18 LAB — POCT I-STAT, CHEM 8
BUN: 13 mg/dL (ref 8–23)
Calcium, Ion: 1.22 mmol/L (ref 1.15–1.40)
Chloride: 106 mmol/L (ref 98–111)
Creatinine, Ser: 0.8 mg/dL (ref 0.44–1.00)
Glucose, Bld: 98 mg/dL (ref 70–99)
HCT: 34 % — ABNORMAL LOW (ref 36.0–46.0)
Hemoglobin: 11.6 g/dL — ABNORMAL LOW (ref 12.0–15.0)
Potassium: 4 mmol/L (ref 3.5–5.1)
Sodium: 143 mmol/L (ref 135–145)
TCO2: 30 mmol/L (ref 22–32)

## 2021-11-18 MED ORDER — SODIUM CHLORIDE 0.9 % IV SOLN: INTRAVENOUS | Status: DC

## 2021-11-18 MED ORDER — OXYCODONE HCL 5 MG PO TABS: 5.0000 mg | ORAL_TABLET | Freq: Once | ORAL | Status: DC

## 2021-11-18 NOTE — H&P (View-Only) (Signed)
?  ?  Patient presented for arch aortogram with left upper extremity angiography today.  Unfortunately due to Cath Lab emergency we have rescheduled her for next week.  Patient was understanding when the Cath Lab nurse called to discuss. ? ?Mong Neal C. Donzetta Matters, MD ?[facility] ?Office: [phone] ?Pager: [phone] ? ?

## 2021-11-18 NOTE — Interval H&P Note (Signed)
History and Physical Interval Note: ? ?11/18/2021 ?7:18 AM ? ?Donna Bailey  has presented today for surgery, with the diagnosis of left subclavian artery stenosis.  The various methods of treatment have been discussed with the patient and family. After consideration of risks, benefits and other options for treatment, the patient has consented to  Procedure(s): ?AORTIC ARCH ANGIOGRAPHY (N/A) ?Upper Extremity Angiography (N/A) as a surgical intervention.  The patient's history has been reviewed, patient examined, no change in status, stable for surgery.  I have reviewed the patient's chart and labs.  Questions were answered to the patient's satisfaction.   ? ? ?Servando Snare ? ? ?

## 2021-11-18 NOTE — Progress Notes (Signed)
?  ?  Patient presented for arch aortogram with left upper extremity angiography today.  Unfortunately due to Cath Lab emergency we have rescheduled her for next week.  Patient was understanding when the Cath Lab nurse called to discuss. ? ?Raul Torrance C. Donzetta Matters, MD ?[facility] ?Office: [phone] ?Pager: [phone] ? ?

## 2021-11-20 ENCOUNTER — Ambulatory Visit: Payer: Medicare Other | Admitting: Physical Therapy

## 2021-11-22 ENCOUNTER — Ambulatory Visit: Payer: Medicare Other | Admitting: Physical Therapy

## 2021-11-25 ENCOUNTER — Other Ambulatory Visit: Payer: Self-pay

## 2021-11-25 ENCOUNTER — Ambulatory Visit (HOSPITAL_COMMUNITY): Payer: Medicare Other | Admitting: Anesthesiology

## 2021-11-25 ENCOUNTER — Inpatient Hospital Stay (HOSPITAL_COMMUNITY)
Admission: AD | Admit: 2021-11-25 | Discharge: 2021-11-27 | DRG: 252 | Disposition: A | Discharge status: Home / Self Care | Payer: Medicare Other | Attending: Vascular Surgery | Admitting: Vascular Surgery

## 2021-11-25 ENCOUNTER — Encounter (HOSPITAL_COMMUNITY)
Admission: AD | Disposition: A | Discharge status: Home / Self Care | Payer: Self-pay | Source: Home / Self Care | Attending: Vascular Surgery

## 2021-11-25 DIAGNOSIS — I2510 Atherosclerotic heart disease of native coronary artery without angina pectoris: Secondary | ICD-10-CM | POA: Diagnosis present

## 2021-11-25 DIAGNOSIS — Z7982 Long term (current) use of aspirin: Secondary | ICD-10-CM | POA: Diagnosis not present

## 2021-11-25 DIAGNOSIS — D72829 Elevated white blood cell count, unspecified: Secondary | ICD-10-CM | POA: Diagnosis not present

## 2021-11-25 DIAGNOSIS — I742 Embolism and thrombosis of arteries of the upper extremities: Secondary | ICD-10-CM | POA: Diagnosis not present

## 2021-11-25 DIAGNOSIS — K219 Gastro-esophageal reflux disease without esophagitis: Secondary | ICD-10-CM | POA: Diagnosis present

## 2021-11-25 DIAGNOSIS — Z955 Presence of coronary angioplasty implant and graft: Secondary | ICD-10-CM | POA: Diagnosis not present

## 2021-11-25 DIAGNOSIS — I10 Essential (primary) hypertension: Secondary | ICD-10-CM | POA: Diagnosis not present

## 2021-11-25 DIAGNOSIS — M797 Fibromyalgia: Secondary | ICD-10-CM | POA: Diagnosis present

## 2021-11-25 DIAGNOSIS — Z8371 Family history of colonic polyps: Secondary | ICD-10-CM | POA: Diagnosis not present

## 2021-11-25 DIAGNOSIS — Z803 Family history of malignant neoplasm of breast: Secondary | ICD-10-CM | POA: Diagnosis not present

## 2021-11-25 DIAGNOSIS — G629 Polyneuropathy, unspecified: Secondary | ICD-10-CM | POA: Diagnosis present

## 2021-11-25 DIAGNOSIS — Z8249 Family history of ischemic heart disease and other diseases of the circulatory system: Secondary | ICD-10-CM

## 2021-11-25 DIAGNOSIS — I97638 Postprocedural hematoma of a circulatory system organ or structure following other circulatory system procedure: Secondary | ICD-10-CM | POA: Diagnosis not present

## 2021-11-25 DIAGNOSIS — I252 Old myocardial infarction: Secondary | ICD-10-CM

## 2021-11-25 DIAGNOSIS — Z881 Allergy status to other antibiotic agents status: Secondary | ICD-10-CM

## 2021-11-25 DIAGNOSIS — E7800 Pure hypercholesterolemia, unspecified: Secondary | ICD-10-CM | POA: Diagnosis present

## 2021-11-25 DIAGNOSIS — T8119XA Other postprocedural shock, initial encounter: Secondary | ICD-10-CM | POA: Diagnosis not present

## 2021-11-25 DIAGNOSIS — Z806 Family history of leukemia: Secondary | ICD-10-CM

## 2021-11-25 DIAGNOSIS — I255 Ischemic cardiomyopathy: Secondary | ICD-10-CM | POA: Diagnosis present

## 2021-11-25 DIAGNOSIS — Z91040 Latex allergy status: Secondary | ICD-10-CM

## 2021-11-25 DIAGNOSIS — I129 Hypertensive chronic kidney disease with stage 1 through stage 4 chronic kidney disease, or unspecified chronic kidney disease: Secondary | ICD-10-CM | POA: Diagnosis present

## 2021-11-25 DIAGNOSIS — D62 Acute posthemorrhagic anemia: Secondary | ICD-10-CM | POA: Diagnosis not present

## 2021-11-25 DIAGNOSIS — Z888 Allergy status to other drugs, medicaments and biological substances status: Secondary | ICD-10-CM

## 2021-11-25 DIAGNOSIS — Z7901 Long term (current) use of anticoagulants: Secondary | ICD-10-CM | POA: Diagnosis not present

## 2021-11-25 DIAGNOSIS — I97418 Intraoperative hemorrhage and hematoma of a circulatory system organ or structure complicating other circulatory system procedure: Secondary | ICD-10-CM | POA: Diagnosis not present

## 2021-11-25 DIAGNOSIS — I25119 Atherosclerotic heart disease of native coronary artery with unspecified angina pectoris: Secondary | ICD-10-CM | POA: Diagnosis not present

## 2021-11-25 DIAGNOSIS — F419 Anxiety disorder, unspecified: Secondary | ICD-10-CM | POA: Diagnosis present

## 2021-11-25 DIAGNOSIS — Y838 Other surgical procedures as the cause of abnormal reaction of the patient, or of later complication, without mention of misadventure at the time of the procedure: Secondary | ICD-10-CM | POA: Diagnosis not present

## 2021-11-25 DIAGNOSIS — N182 Chronic kidney disease, stage 2 (mild): Secondary | ICD-10-CM | POA: Diagnosis present

## 2021-11-25 DIAGNOSIS — Z79899 Other long term (current) drug therapy: Secondary | ICD-10-CM

## 2021-11-25 DIAGNOSIS — Z882 Allergy status to sulfonamides status: Secondary | ICD-10-CM

## 2021-11-25 DIAGNOSIS — Z886 Allergy status to analgesic agent status: Secondary | ICD-10-CM

## 2021-11-25 DIAGNOSIS — I771 Stricture of artery: Secondary | ICD-10-CM | POA: Diagnosis not present

## 2021-11-25 DIAGNOSIS — Z90710 Acquired absence of both cervix and uterus: Secondary | ICD-10-CM

## 2021-11-25 DIAGNOSIS — I70208 Unspecified atherosclerosis of native arteries of extremities, other extremity: Secondary | ICD-10-CM

## 2021-11-25 DIAGNOSIS — I739 Peripheral vascular disease, unspecified: Secondary | ICD-10-CM | POA: Diagnosis present

## 2021-11-25 DIAGNOSIS — I959 Hypotension, unspecified: Secondary | ICD-10-CM | POA: Diagnosis not present

## 2021-11-25 DIAGNOSIS — Z880 Allergy status to penicillin: Secondary | ICD-10-CM

## 2021-11-25 DIAGNOSIS — T82598A Other mechanical complication of other cardiac and vascular devices and implants, initial encounter: Secondary | ICD-10-CM | POA: Diagnosis not present

## 2021-11-25 HISTORY — PX: PERIPHERAL VASCULAR INTERVENTION: CATH118257

## 2021-11-25 HISTORY — PX: UPPER EXTREMITY ANGIOGRAPHY: CATH118270

## 2021-11-25 HISTORY — PX: GROIN DISSECTION: SHX5250

## 2021-11-25 HISTORY — PX: AORTIC ARCH ANGIOGRAPHY: CATH118224

## 2021-11-25 LAB — POCT I-STAT, CHEM 8
BUN: 30 mg/dL — ABNORMAL HIGH (ref 8–23)
Calcium, Ion: 1.15 mmol/L (ref 1.15–1.40)
Chloride: 102 mmol/L (ref 98–111)
Creatinine, Ser: 0.9 mg/dL (ref 0.44–1.00)
Glucose, Bld: 102 mg/dL — ABNORMAL HIGH (ref 70–99)
HCT: 41 % (ref 36.0–46.0)
Hemoglobin: 13.9 g/dL (ref 12.0–15.0)
Potassium: 3.7 mmol/L (ref 3.5–5.1)
Sodium: 140 mmol/L (ref 135–145)
TCO2: 29 mmol/L (ref 22–32)

## 2021-11-25 LAB — POCT I-STAT 7, (LYTES, BLD GAS, ICA,H+H)
Acid-base deficit: 10 mmol/L — ABNORMAL HIGH (ref 0.0–2.0)
Acid-base deficit: 9 mmol/L — ABNORMAL HIGH (ref 0.0–2.0)
Bicarbonate: 15.4 mmol/L — ABNORMAL LOW (ref 20.0–28.0)
Bicarbonate: 16.2 mmol/L — ABNORMAL LOW (ref 20.0–28.0)
Calcium, Ion: 1.03 mmol/L — ABNORMAL LOW (ref 1.15–1.40)
Calcium, Ion: 1.02 mmol/L — ABNORMAL LOW (ref 1.15–1.40)
HCT: 21 % — ABNORMAL LOW (ref 36.0–46.0)
HCT: 43 % (ref 36.0–46.0)
Hemoglobin: 7.1 g/dL — ABNORMAL LOW (ref 12.0–15.0)
Hemoglobin: 14.6 g/dL (ref 12.0–15.0)
O2 Saturation: 100 %
O2 Saturation: 95 %
Patient temperature: 97.8
Potassium: 4.6 mmol/L (ref 3.5–5.1)
Potassium: 3.8 mmol/L (ref 3.5–5.1)
Sodium: 134 mmol/L — ABNORMAL LOW (ref 135–145)
Sodium: 138 mmol/L (ref 135–145)
TCO2: 16 mmol/L — ABNORMAL LOW (ref 22–32)
TCO2: 17 mmol/L — ABNORMAL LOW (ref 22–32)
pCO2 arterial: 30.5 mmHg — ABNORMAL LOW (ref 32–48)
pCO2 arterial: 31.3 mmHg — ABNORMAL LOW (ref 32–48)
pH, Arterial: 7.311 — ABNORMAL LOW (ref 7.35–7.45)
pH, Arterial: 7.321 — ABNORMAL LOW (ref 7.35–7.45)
pO2, Arterial: 465 mmHg — ABNORMAL HIGH (ref 83–108)
pO2, Arterial: 81 mmHg — ABNORMAL LOW (ref 83–108)

## 2021-11-25 LAB — CBC
HCT: 26.7 % — ABNORMAL LOW (ref 36.0–46.0)
Hemoglobin: 8.3 g/dL — ABNORMAL LOW (ref 12.0–15.0)
MCH: 30 pg (ref 26.0–34.0)
MCHC: 31.1 g/dL (ref 30.0–36.0)
MCV: 96.4 fL (ref 80.0–100.0)
Platelets: 293 10*3/uL (ref 150–400)
RBC: 2.77 MIL/uL — ABNORMAL LOW (ref 3.87–5.11)
RDW: 16.1 % — ABNORMAL HIGH (ref 11.5–15.5)
WBC: 21.4 10*3/uL — ABNORMAL HIGH (ref 4.0–10.5)
nRBC: 0.2 % (ref 0.0–0.2)

## 2021-11-25 LAB — ABO/RH: ABO/RH(D): O POS

## 2021-11-25 SURGERY — EXPLORATION, INGUINAL REGION
Anesthesia: General | Laterality: Right

## 2021-11-25 SURGERY — AORTIC ARCH ANGIOGRAPHY
Anesthesia: LOCAL

## 2021-11-25 MED ORDER — NOREPINEPHRINE BITARTRATE 1 MG/ML IV SOLN
0.0000 ug/min | INTRAVENOUS | Status: DC
  Administered 2021-11-25: 40 ug/min via INTRAVENOUS
  Filled 2021-11-25 (×4): qty 4

## 2021-11-25 MED ORDER — SODIUM CHLORIDE 0.9 % IV SOLN: INTRAVENOUS | Status: DC

## 2021-11-25 MED ORDER — DOCUSATE SODIUM 100 MG PO CAPS
100.0000 mg | ORAL_CAPSULE | Freq: Every day | ORAL | Status: DC
  Administered 2021-11-26 – 2021-11-27 (×2): 100 mg via ORAL
  Filled 2021-11-25 (×2): qty 1

## 2021-11-25 MED ORDER — PROPOFOL 10 MG/ML IV BOLUS
INTRAVENOUS | Status: AC
  Filled 2021-11-25: qty 20

## 2021-11-25 MED ORDER — GUAIFENESIN-CODEINE 100-10 MG/5ML PO SOLN: 2.5000 mL | Freq: Every day | ORAL | Status: DC | PRN

## 2021-11-25 MED ORDER — LIDOCAINE HCL (PF) 1 % IJ SOLN
INTRAMUSCULAR | Status: DC | PRN
  Administered 2021-11-25: 12 mL

## 2021-11-25 MED ORDER — HEPARIN (PORCINE) IN NACL 1000-0.9 UT/500ML-% IV SOLN
INTRAVENOUS | Status: DC | PRN
  Administered 2021-11-25 (×2): 500 mL

## 2021-11-25 MED ORDER — HEPARIN (PORCINE) IN NACL 1000-0.9 UT/500ML-% IV SOLN
INTRAVENOUS | Status: AC
  Filled 2021-11-25: qty 1000

## 2021-11-25 MED ORDER — PROTAMINE SULFATE 10 MG/ML IV SOLN
INTRAVENOUS | Status: AC
Start: 2021-11-25 — End: ?
  Filled 2021-11-25: qty 5

## 2021-11-25 MED ORDER — GUAIFENESIN-DM 100-10 MG/5ML PO SYRP: 15.0000 mL | ORAL_SOLUTION | ORAL | Status: DC | PRN

## 2021-11-25 MED ORDER — SODIUM CHLORIDE 0.9% FLUSH
3.0000 mL | Freq: Two times a day (BID) | INTRAVENOUS | Status: DC
  Administered 2021-11-26 – 2021-11-27 (×3): 3 mL via INTRAVENOUS

## 2021-11-25 MED ORDER — FAMOTIDINE IN NACL 20-0.9 MG/50ML-% IV SOLN
INTRAVENOUS | Status: AC
  Filled 2021-11-25: qty 50

## 2021-11-25 MED ORDER — LIDOCAINE-EPINEPHRINE 1 %-1:100000 IJ SOLN
INTRAMUSCULAR | Status: DC | PRN
  Administered 2021-11-25: 2 mg

## 2021-11-25 MED ORDER — HYDRALAZINE HCL 20 MG/ML IJ SOLN
5.0000 mg | INTRAMUSCULAR | Status: DC | PRN
  Administered 2021-11-26: 5 mg via INTRAVENOUS
  Filled 2021-11-25: qty 1

## 2021-11-25 MED ORDER — LABETALOL HCL 5 MG/ML IV SOLN
10.0000 mg | Freq: Once | INTRAVENOUS | Status: AC
  Administered 2021-11-25: 10 mg via INTRAVENOUS
  Filled 2021-11-25: qty 4

## 2021-11-25 MED ORDER — CEFAZOLIN SODIUM-DEXTROSE 2-3 GM-%(50ML) IV SOLR
INTRAVENOUS | Status: DC | PRN
  Administered 2021-11-25: 2 g via INTRAVENOUS

## 2021-11-25 MED ORDER — ONDANSETRON HCL 4 MG/2ML IJ SOLN
INTRAMUSCULAR | Status: DC | PRN
  Administered 2021-11-25: 4 mg via INTRAVENOUS

## 2021-11-25 MED ORDER — CLOPIDOGREL BISULFATE 75 MG PO TABS: 75.0000 mg | ORAL_TABLET | Freq: Every day | ORAL | Status: DC

## 2021-11-25 MED ORDER — HYDRALAZINE HCL 25 MG PO TABS: 25.0000 mg | ORAL_TABLET | Freq: Every day | ORAL | Status: DC | PRN

## 2021-11-25 MED ORDER — IODIXANOL 320 MG/ML IV SOLN
INTRAVENOUS | Status: DC | PRN
  Administered 2021-11-25: 125 mL

## 2021-11-25 MED ORDER — PANTOPRAZOLE SODIUM 40 MG PO TBEC: 40.0000 mg | DELAYED_RELEASE_TABLET | Freq: Every day | ORAL | Status: DC

## 2021-11-25 MED ORDER — ROCURONIUM BROMIDE 10 MG/ML (PF) SYRINGE
PREFILLED_SYRINGE | INTRAVENOUS | Status: AC
  Filled 2021-11-25: qty 10

## 2021-11-25 MED ORDER — MAGNESIUM SULFATE 2 GM/50ML IV SOLN: 2.0000 g | Freq: Every day | INTRAVENOUS | Status: DC | PRN

## 2021-11-25 MED ORDER — LIDOCAINE 2% (20 MG/ML) 5 ML SYRINGE
INTRAMUSCULAR | Status: DC | PRN
  Administered 2021-11-25: 80 mg via INTRAVENOUS

## 2021-11-25 MED ORDER — HEPARIN 6000 UNIT IRRIGATION SOLUTION
Status: AC
  Filled 2021-11-25: qty 500

## 2021-11-25 MED ORDER — SUCCINYLCHOLINE CHLORIDE 200 MG/10ML IV SOSY
PREFILLED_SYRINGE | INTRAVENOUS | Status: DC | PRN
  Administered 2021-11-25: 80 mg via INTRAVENOUS

## 2021-11-25 MED ORDER — BISACODYL 5 MG PO TBEC: 5.0000 mg | DELAYED_RELEASE_TABLET | Freq: Every day | ORAL | Status: DC | PRN

## 2021-11-25 MED ORDER — SENNOSIDES-DOCUSATE SODIUM 8.6-50 MG PO TABS: 1.0000 | ORAL_TABLET | Freq: Every evening | ORAL | Status: DC | PRN

## 2021-11-25 MED ORDER — ROCURONIUM BROMIDE 10 MG/ML (PF) SYRINGE
PREFILLED_SYRINGE | INTRAVENOUS | Status: DC | PRN
Start: 2021-11-25 — End: 2021-11-25
  Administered 2021-11-25: 30 mg via INTRAVENOUS

## 2021-11-25 MED ORDER — METOPROLOL TARTRATE 5 MG/5ML IV SOLN: 2.0000 mg | INTRAVENOUS | Status: DC | PRN

## 2021-11-25 MED ORDER — ONDANSETRON HCL 4 MG/2ML IJ SOLN
INTRAMUSCULAR | Status: AC
  Filled 2021-11-25: qty 2

## 2021-11-25 MED ORDER — ALBUMIN HUMAN 5 % IV SOLN: INTRAVENOUS | Status: DC | PRN

## 2021-11-25 MED ORDER — LABETALOL HCL 5 MG/ML IV SOLN: 10.0000 mg | INTRAVENOUS | Status: DC | PRN

## 2021-11-25 MED ORDER — NITROGLYCERIN 0.4 MG SL SUBL: 0.4000 mg | SUBLINGUAL_TABLET | SUBLINGUAL | Status: DC | PRN

## 2021-11-25 MED ORDER — FAMOTIDINE IN NACL 20-0.9 MG/50ML-% IV SOLN
INTRAVENOUS | Status: AC | PRN
  Administered 2021-11-25: 20 mg via INTRAVENOUS

## 2021-11-25 MED ORDER — PROMETHAZINE-CODEINE 6.25-10 MG/5ML PO SYRP: 5.0000 mL | ORAL_SOLUTION | Freq: Four times a day (QID) | ORAL | Status: DC | PRN

## 2021-11-25 MED ORDER — SUGAMMADEX SODIUM 200 MG/2ML IV SOLN
INTRAVENOUS | Status: DC | PRN
  Administered 2021-11-25: 200 mg via INTRAVENOUS

## 2021-11-25 MED ORDER — POTASSIUM CHLORIDE CRYS ER 20 MEQ PO TBCR
20.0000 meq | EXTENDED_RELEASE_TABLET | Freq: Every day | ORAL | Status: AC | PRN
  Administered 2021-11-26: 20 meq via ORAL
  Filled 2021-11-25: qty 1

## 2021-11-25 MED ORDER — SODIUM CHLORIDE 0.9 % IV SOLN: 250.0000 mL | INTRAVENOUS | Status: DC | PRN

## 2021-11-25 MED ORDER — FENTANYL CITRATE (PF) 100 MCG/2ML IJ SOLN
INTRAMUSCULAR | Status: DC | PRN
  Administered 2021-11-25: 50 ug via INTRAVENOUS
  Administered 2021-11-25: 100 ug via INTRAVENOUS
  Administered 2021-11-25: 50 ug via INTRAVENOUS

## 2021-11-25 MED ORDER — LACTATED RINGERS IV SOLN: INTRAVENOUS | Status: DC | PRN

## 2021-11-25 MED ORDER — OXYCODONE HCL 5 MG PO TABS
5.0000 mg | ORAL_TABLET | ORAL | Status: DC | PRN
  Administered 2021-11-26 (×2): 5 mg via ORAL
  Administered 2021-11-26 – 2021-11-27 (×2): 10 mg via ORAL
  Filled 2021-11-25: qty 2
  Filled 2021-11-25: qty 1
  Filled 2021-11-25: qty 2
  Filled 2021-11-25: qty 1

## 2021-11-25 MED ORDER — ALUM & MAG HYDROXIDE-SIMETH 200-200-20 MG/5ML PO SUSP: 15.0000 mL | ORAL | Status: DC | PRN

## 2021-11-25 MED ORDER — LIDOCAINE-EPINEPHRINE 1 %-1:100000 IJ SOLN
INTRAMUSCULAR | Status: AC
  Filled 2021-11-25: qty 1

## 2021-11-25 MED ORDER — NOREPINEPHRINE 4 MG/250ML-% IV SOLN
INTRAVENOUS | Status: AC
  Filled 2021-11-25: qty 250

## 2021-11-25 MED ORDER — CLOPIDOGREL BISULFATE 75 MG PO TABS
75.0000 mg | ORAL_TABLET | Freq: Every day | ORAL | Status: DC
  Filled 2021-11-25: qty 1

## 2021-11-25 MED ORDER — FENTANYL CITRATE (PF) 250 MCG/5ML IJ SOLN
INTRAMUSCULAR | Status: AC
  Filled 2021-11-25: qty 5

## 2021-11-25 MED ORDER — SODIUM CHLORIDE 0.9 % WEIGHT BASED INFUSION
1.0000 mL/kg/h | INTRAVENOUS | Status: AC
  Administered 2021-11-25: 1 mL/kg/h via INTRAVENOUS

## 2021-11-25 MED ORDER — ONDANSETRON HCL 4 MG/2ML IJ SOLN: 4.0000 mg | Freq: Four times a day (QID) | INTRAMUSCULAR | Status: DC | PRN

## 2021-11-25 MED ORDER — HEPARIN SODIUM (PORCINE) 5000 UNIT/ML IJ SOLN
5000.0000 [IU] | Freq: Three times a day (TID) | INTRAMUSCULAR | Status: DC
  Administered 2021-11-26 – 2021-11-27 (×4): 5000 [IU] via SUBCUTANEOUS
  Filled 2021-11-25 (×4): qty 1

## 2021-11-25 MED ORDER — PHENYLEPHRINE 80 MCG/ML (10ML) SYRINGE FOR IV PUSH (FOR BLOOD PRESSURE SUPPORT)
PREFILLED_SYRINGE | INTRAVENOUS | Status: DC | PRN
Start: 2021-11-25 — End: 2021-11-25
  Administered 2021-11-25: 160 ug via INTRAVENOUS

## 2021-11-25 MED ORDER — CLONIDINE HCL 0.2 MG PO TABS: 0.2000 mg | ORAL_TABLET | ORAL | Status: DC | PRN

## 2021-11-25 MED ORDER — CARVEDILOL 3.125 MG PO TABS
3.1250 mg | ORAL_TABLET | Freq: Two times a day (BID) | ORAL | Status: DC
  Administered 2021-11-26 – 2021-11-27 (×3): 3.125 mg via ORAL
  Filled 2021-11-25 (×3): qty 1

## 2021-11-25 MED ORDER — MORPHINE SULFATE (PF) 2 MG/ML IV SOLN
2.0000 mg | INTRAVENOUS | Status: DC | PRN
  Administered 2021-11-25 (×2): 2 mg via INTRAVENOUS
  Filled 2021-11-25 (×2): qty 1

## 2021-11-25 MED ORDER — MAGNESIUM OXIDE -MG SUPPLEMENT 400 (240 MG) MG PO TABS: 400.0000 mg | ORAL_TABLET | Freq: Every evening | ORAL | Status: DC

## 2021-11-25 MED ORDER — HEPARIN SODIUM (PORCINE) 1000 UNIT/ML IJ SOLN
INTRAMUSCULAR | Status: AC
  Filled 2021-11-25: qty 10

## 2021-11-25 MED ORDER — PROTAMINE SULFATE 10 MG/ML IV SOLN
INTRAVENOUS | Status: DC | PRN
  Administered 2021-11-25: 5 mg via INTRAVENOUS

## 2021-11-25 MED ORDER — SODIUM CHLORIDE 0.9 % IV SOLN: 500.0000 mL | Freq: Once | INTRAVENOUS | Status: DC | PRN

## 2021-11-25 MED ORDER — 0.9 % SODIUM CHLORIDE (POUR BTL) OPTIME
TOPICAL | Status: DC | PRN
  Administered 2021-11-25: 1000 mL

## 2021-11-25 MED ORDER — SODIUM CHLORIDE 0.9% FLUSH: 3.0000 mL | INTRAVENOUS | Status: DC | PRN

## 2021-11-25 MED ORDER — ONDANSETRON HCL 4 MG PO TABS
4.0000 mg | ORAL_TABLET | Freq: Three times a day (TID) | ORAL | Status: DC | PRN
  Administered 2021-11-26: 4 mg via ORAL
  Filled 2021-11-25: qty 1

## 2021-11-25 MED ORDER — VANCOMYCIN HCL IN DEXTROSE 1-5 GM/200ML-% IV SOLN
1000.0000 mg | Freq: Two times a day (BID) | INTRAVENOUS | Status: AC
  Administered 2021-11-25 – 2021-11-26 (×2): 1000 mg via INTRAVENOUS
  Filled 2021-11-25 (×2): qty 200

## 2021-11-25 MED ORDER — NITROGLYCERIN 2 % TD OINT
1.0000 [in_us] | TOPICAL_OINTMENT | Freq: Every day | TRANSDERMAL | Status: DC
  Administered 2021-11-25 – 2021-11-27 (×3): 1 [in_us] via TOPICAL
  Filled 2021-11-25: qty 30

## 2021-11-25 MED ORDER — PANTOPRAZOLE 2 MG/ML SUSPENSION
40.0000 mg | Freq: Every day | ORAL | Status: DC
  Administered 2021-11-25: 40 mg
  Filled 2021-11-25 (×3): qty 20

## 2021-11-25 MED ORDER — NOREPINEPHRINE 4 MG/250ML-% IV SOLN
0.0000 ug/min | INTRAVENOUS | Status: DC
  Administered 2021-11-25: 10 ug/min via INTRAVENOUS
  Filled 2021-11-25: qty 250

## 2021-11-25 MED ORDER — LIDOCAINE HCL (PF) 1 % IJ SOLN
INTRAMUSCULAR | Status: AC
  Filled 2021-11-25: qty 30

## 2021-11-25 MED ORDER — CLOPIDOGREL BISULFATE 300 MG PO TABS
300.0000 mg | ORAL_TABLET | Freq: Once | ORAL | Status: AC
  Administered 2021-11-25: 300 mg via ORAL
  Filled 2021-11-25: qty 1

## 2021-11-25 MED ORDER — PROPOFOL 10 MG/ML IV BOLUS
INTRAVENOUS | Status: DC | PRN
  Administered 2021-11-25: 60 mg via INTRAVENOUS
  Administered 2021-11-25: 50 mg via INTRAVENOUS

## 2021-11-25 MED ORDER — DEXAMETHASONE SODIUM PHOSPHATE 10 MG/ML IJ SOLN
INTRAMUSCULAR | Status: AC
  Filled 2021-11-25: qty 1

## 2021-11-25 MED ORDER — PHENOL 1.4 % MT LIQD: 1.0000 | OROMUCOSAL | Status: DC | PRN

## 2021-11-25 MED ORDER — GABAPENTIN 10 % EX CREA
1.0000 "application " | TOPICAL_CREAM | Freq: Every day | CUTANEOUS | Status: DC
  Administered 2021-11-26: 1 via TOPICAL

## 2021-11-25 MED ORDER — ONDANSETRON HCL 4 MG/2ML IJ SOLN
INTRAMUSCULAR | Status: DC | PRN
  Administered 2021-11-25: 4 mg

## 2021-11-25 MED ORDER — PHENYLEPHRINE HCL (PRESSORS) 10 MG/ML IV SOLN
INTRAVENOUS | Status: DC | PRN
  Administered 2021-11-25: 160 ug via INTRAVENOUS

## 2021-11-25 MED ORDER — HEPARIN SODIUM (PORCINE) 1000 UNIT/ML IJ SOLN
INTRAMUSCULAR | Status: DC | PRN
  Administered 2021-11-25: 3000 [IU] via INTRAVENOUS
  Administered 2021-11-25: 7000 [IU] via INTRAVENOUS

## 2021-11-25 SURGICAL SUPPLY — 34 items
ADH SKN CLS APL DERMABOND .7 (GAUZE/BANDAGES/DRESSINGS)
CANISTER SUCT 3000ML PPV (MISCELLANEOUS) ×3 IMPLANT
COVER PROBE W GEL 5X96 (DRAPES) ×2 IMPLANT
DERMABOND ADVANCED (GAUZE/BANDAGES/DRESSINGS)
DERMABOND ADVANCED .7 DNX12 (GAUZE/BANDAGES/DRESSINGS) ×1 IMPLANT
DRSG TEGADERM 4X4.75 (GAUZE/BANDAGES/DRESSINGS) ×4 IMPLANT
EVACUATOR 1/8 PVC DRAIN (DRAIN) ×2 IMPLANT
GAUZE SPONGE 4X4 12PLY STRL (GAUZE/BANDAGES/DRESSINGS) ×2 IMPLANT
GAUZE SPONGE 4X4 12PLY STRL LF (GAUZE/BANDAGES/DRESSINGS) ×2 IMPLANT
GLOVE BIO SURGEON STRL SZ7.5 (GLOVE) ×1 IMPLANT
GLOVE BIOGEL PI IND STRL 8 (GLOVE) ×1 IMPLANT
GLOVE BIOGEL PI INDICATOR 8 (GLOVE)
GLOVE SRG 8 PF TXTR STRL LF DI (GLOVE) ×2 IMPLANT
GLOVE SURG POLYISO LF SZ8 (GLOVE) ×6 IMPLANT
GLOVE SURG UNDER POLY LF SZ8 (GLOVE) ×3
GOWN STRL REUS W/ TWL LRG LVL3 (GOWN DISPOSABLE) ×5 IMPLANT
GOWN STRL REUS W/TWL 2XL LVL3 (GOWN DISPOSABLE) ×3 IMPLANT
GOWN STRL REUS W/TWL LRG LVL3 (GOWN DISPOSABLE) ×9
KIT BASIN OR (CUSTOM PROCEDURE TRAY) ×3 IMPLANT
KIT TURNOVER KIT B (KITS) ×3 IMPLANT
NS IRRIG 1000ML POUR BTL (IV SOLUTION) ×3 IMPLANT
PACK PERIPHERAL VASCULAR (CUSTOM PROCEDURE TRAY) ×3 IMPLANT
PAD ARMBOARD 7.5X6 YLW CONV (MISCELLANEOUS) ×3 IMPLANT
SPONGE T-LAP 18X18 ~~LOC~~+RFID (SPONGE) ×2 IMPLANT
STAPLER VISISTAT 35W (STAPLE) ×2 IMPLANT
SUT MNCRL AB 4-0 PS2 18 (SUTURE) ×3 IMPLANT
SUT PROLENE 5 0 C 1 24 (SUTURE) ×7 IMPLANT
SUT PROLENE 6 0 BV (SUTURE) ×3 IMPLANT
SUT VIC AB 2-0 CT1 27 (SUTURE) ×6
SUT VIC AB 2-0 CT1 TAPERPNT 27 (SUTURE) ×3 IMPLANT
SUT VIC AB 3-0 SH 27 (SUTURE) ×3
SUT VIC AB 3-0 SH 27X BRD (SUTURE) ×2 IMPLANT
TOWEL GREEN STERILE (TOWEL DISPOSABLE) ×3 IMPLANT
WATER STERILE IRR 1000ML POUR (IV SOLUTION) ×3 IMPLANT

## 2021-11-25 SURGICAL SUPPLY — 16 items
CATH ANGIO 5F BER2 100CM (CATHETERS) ×1 IMPLANT
CATH ANGIO 5F PIGTAIL 100CM (CATHETERS) ×1 IMPLANT
CLOSURE MYNX CONTROL 6F/7F (Vascular Products) ×1 IMPLANT
FEM STOP ARCH (HEMOSTASIS) ×3
KIT ENCORE 26 ADVANTAGE (KITS) ×1 IMPLANT
KIT MICROPUNCTURE NIT STIFF (SHEATH) ×1 IMPLANT
KIT PV (KITS) ×3 IMPLANT
SHEATH DESTINATION 7FR 90 (SHEATH) ×1 IMPLANT
SHEATH PINNACLE 5F 10CM (SHEATH) ×1 IMPLANT
SHEATH PINNACLE 7F 10CM (SHEATH) ×1 IMPLANT
STENT VIABAHNBX 7X29X135 (Permanent Stent) ×1 IMPLANT
SYSTEM COMPRESSION FEMOSTOP (HEMOSTASIS) IMPLANT
TRANSDUCER W/STOPCOCK (MISCELLANEOUS) ×3 IMPLANT
TRAY PV CATH (CUSTOM PROCEDURE TRAY) ×3 IMPLANT
WIRE BENTSON .035X145CM (WIRE) ×1 IMPLANT
WIRE ROSEN-J .035X260CM (WIRE) ×1 IMPLANT

## 2021-11-25 NOTE — Transfer of Care (Signed)
Immediate Anesthesia Transfer of Care Note ? ?[name] ? ?Procedure(s) Performed: RIGHT GROIN EXPLORATION (Right) ? ?Patient Location: PACU ? ?Anesthesia Type:General ? ?Level of Consciousness: awake, alert  and oriented ? ?Airway & Oxygen Therapy: Patient Spontanous Breathing ? ?Post-op Assessment: Report given to RN and Post -op Vital signs reviewed and stable ? ?Post vital signs: Reviewed and stable ? ?Last Vitals:  ?Vitals Value Taken Time  ?BP 135/72 11/25/21 1758  ?Temp    ?Pulse 82 11/25/21 1759  ?Resp 17 11/25/21 1759  ?SpO2 98 % 11/25/21 1759  ?Vitals shown include unvalidated device data. ? ?Last Pain:  ?Vitals:  ? 11/25/21 1521  ?TempSrc:   ?PainSc: 0-No pain  ?   ? ?  ? ?Complications: No notable events documented. ?

## 2021-11-25 NOTE — Progress Notes (Signed)
Femostop applied to right groin per Dr. Donzetta Matters. 45-40mHg.. Large area of hematoma present prior to femostop application. ? ?Hematoma extends 6 inches medial and laterally from stick site. Hematoma is 4 inches proximal and 4 inches distal to stick site.  ? ?Right pt pulse present with doppler. Right dp absent, was reported to be absent prior to procedure. ?

## 2021-11-25 NOTE — Interval H&P Note (Signed)
History and Physical Interval Note: ? ?11/25/2021 ?10:56 AM ? ?Donna Bailey  has presented today for surgery, with the diagnosis of left subclavian artery stenosis.  The various methods of treatment have been discussed with the patient and family. After consideration of risks, benefits and other options for treatment, the patient has consented to  Procedure(s): ?AORTIC ARCH ANGIOGRAPHY (N/A) ?Upper Extremity Angiography (N/A) as a surgical intervention.  The patient's history has been reviewed, patient examined, no change in status, stable for surgery.  I have reviewed the patient's chart and labs.  Questions were answered to the patient's satisfaction.   ? ? ?Servando Snare ? ? ?

## 2021-11-25 NOTE — Progress Notes (Signed)
?  Received patient from [facility] procedures wth Femstop to right groin set at 80m/hg.  Pt was receiving IVF bolus for hypotension.  Patient place on monitor and pressure continue to be low.  Dr CDonzetta Mattersto see patient. Another IVF bolus and CBC ordered by Dr CDonzetta Matters Unable to get CBC and Lab was called to get a Stat CBC. Levophed was also ordered and started.   Hematoma continue to grow to outer hip and Dr CDonzetta Mattersnotified.  Dr RVirl Cageyin to see patient and ordered two units of blood.  Blood Bank notified and blood was retrieved to take to the OR. OR team in to get IV access and prep for surgery.  BSherlyn Lickcame to hold manual pressure on groin until OR team took patient for surgery. ?

## 2021-11-25 NOTE — Progress Notes (Signed)
?  ?  Unfortunately patient had persistent hypotension with expanding hematoma in the right groin.  Protamine was given at the completion of the procedure but she did not tolerate this well.  Levophed was initiated and she was thought best for operative intervention.  Her husband was updated via telephone and we will plan for emergent exploration of right groin. ? ?Mihaela Fajardo C. Donzetta Matters, MD ?[facility] ?Office: [phone] ?Pager: [phone] ? ? ?

## 2021-11-25 NOTE — Anesthesia Procedure Notes (Signed)
Procedure Name: Intubation ?Date/Time: 11/25/2021 4:44 PM ?Performed by: Barrington Ellison, CRNA ?Pre-anesthesia Checklist: Patient identified, Emergency Drugs available, Suction available and Patient being monitored ?Patient Re-evaluated:Patient Re-evaluated prior to induction ?Oxygen Delivery Method: Circle system utilized ?Preoxygenation: Pre-oxygenation with 100% oxygen ?Induction Type: IV induction, Cricoid Pressure applied and Rapid sequence ?Ventilation: Mask ventilation without difficulty ?Laryngoscope Size: Mac and 3 ?Grade View: Grade I ?Tube type: Oral ?Tube size: 7.0 mm ?Number of attempts: 1 ?Airway Equipment and Method: Stylet and Oral airway ?Placement Confirmation: ETT inserted through vocal cords under direct vision, positive ETCO2 and breath sounds checked- equal and bilateral ?Secured at: 21 cm ?Tube secured with: Tape ?Dental Injury: Teeth and Oropharynx as per pre-operative assessment  ? ? ? ? ?

## 2021-11-25 NOTE — Op Note (Signed)
? ? ?  Patient name: Donna Bailey MRN: [id number] DOB: 1945/12/29 Sex: female ? ?11/25/2021 ?Pre-operative Diagnosis: Embolizing lesion left subclavian artery ?Post-operative diagnosis:  Same ?Surgeon:  Luanna Salk. Randie Heinz, MD ?Procedure Performed: ?1.  Ultrasound-guided cannulation right common femoral artery ?2.  Arch aortogram with left upper extremity angiogram ?3.  Stent of left subclavian artery with 7 x 29 mm VBX ?4.  Mynx device closure right common femoral artery ? ? ?Indications: 76 year old female sent to me for evaluation of discolored fingers of the left hand.  Duplex demonstrated subclavian artery lesion on the left she had no palpable pulses there was concern for an embolizing lesion in the left subclavian artery for which she now presents for arch aortogram with possible intervention. ? ?Findings: She has a type I arch with a bovine configuration.  Left subclavian artery gives off a vertebral artery followed by left internal mammary artery and there is approximately 90% stenosis in approximately 20 mm.  After stenting this is resolved to 0%.  Her brachial artery trifurcates just below the elbow into an interosseous, ulnar artery and radial artery which is dominant to the left hand filling the arch.   ? ?The lesion is at the level of possible thoracic outlet but given that the stent opened well I do not think the patient at this time requires thoracic outlet decompression as long as the stent remains patent and her symptoms improved. ?  ?Procedure:  The patient was identified in the holding area and taken to room 8.  The patient was then placed supine on the table and prepped and draped in the usual sterile fashion.  A time out was called.  Ultrasound was used to evaluate the right common femoral artery which was noted be patent and compressible.  The area was anesthetized 1% lidocaine cannulated with micropuncture needle followed by wire and sheath.  Images saved the permanent record.  Bentson wire was  placed followed by 5 [place] sheath.  Patient was administered 3000 units of heparin.  We then placed a pigtail catheter over the wire into the aortic arch and performed aortogram.  With the above findings we selected the left subclavian artery with a Berenstein catheter performed angiography.  An additional 7000 units of heparin was given.  We crossed the subclavian lesion after heparin had circulated for several minutes with Bentson wire followed by Berenstein catheter and performed angiography of the left upper extremity and hand.  We then exchanged for a Rosen wire and then placed a long 7 French sheath into the proximal left subclavian artery.  The lesion was then identified with angiography and was primarily stented with a 7 x 29 mm VBX.  Completion demonstrated no residual stenosis.  Satisfied with this we exchanged for a short 7 [place] sheath and deployed a minx device.  Unfortunately the minx did fail patient developed a hematoma and pressure was held.  We initially attempted to reverse heparinization with protamine but after 5 mg of protamine the patient had a vagal response with blood pressure 60 systolic and heart rate into the 50s.  With this no additional protamine was administered.  Patient was given IV fluid bolus for which she recovered her blood pressure well remained neurologically intact throughout the episode.  Ultimately hemostasis was obtained in her right groin she was transferred recovery in stable condition. ? ?Contrast: 125cc ? ? ?Eliberto Sole C. Randie Heinz, MD ?[facility] ?Office: [phone] ?Pager: [phone] ? ? ?

## 2021-11-25 NOTE — Anesthesia Procedure Notes (Signed)
Central Venous Catheter Insertion ?Performed by: Nolon Nations, MD, anesthesiologist ?Start/End4/24/2023 4:40 PM, 11/25/2021 5:00 PM ?Patient location: Pre-op. ?Preanesthetic checklist: patient identified, IV checked, site marked, risks and benefits discussed, surgical consent, monitors and equipment checked, pre-op evaluation, timeout performed and anesthesia consent ?Position: Trendelenburg ?Lidocaine 1% used for infiltration and patient sedated ?Hand hygiene performed  and maximum sterile barriers used  ?Catheter size: 9 Fr ?Sheath introducer ?Procedure performed using ultrasound guided technique. ?Ultrasound Notes:anatomy identified, needle tip was noted to be adjacent to the nerve/plexus identified, no ultrasound evidence of intravascular and/or intraneural injection and image(s) printed for medical record ?Attempts: 1 ?Following insertion, line sutured, dressing applied and Biopatch. ?Post procedure assessment: blood return through all ports, free fluid flow and no air ? ?Patient tolerated the procedure well with no immediate complications. ? ? ? ? ?

## 2021-11-25 NOTE — Anesthesia Procedure Notes (Signed)
Arterial Line Insertion ?Start/End4/24/2023 4:35 PM, 11/25/2021 4:40 PM ?Performed by: Babs Bertin, CRNA, CRNA ? Patient location: Pre-op. ?Preanesthetic checklist: patient identified, IV checked, site marked, risks and benefits discussed, surgical consent, monitors and equipment checked, pre-op evaluation, timeout performed and anesthesia consent ?Lidocaine 1% used for infiltration ?Right, radial was placed ?Catheter size: 20 G ?Hand hygiene performed  and maximum sterile barriers used  ? ?Attempts: 1 ?Procedure performed using ultrasound guided technique. ?Ultrasound Notes:anatomy identified, needle tip was noted to be adjacent to the nerve/plexus identified and no ultrasound evidence of intravascular and/or intraneural injection ?Following insertion, dressing applied and Biopatch. ?Post procedure assessment: normal and unchanged ? ?Patient tolerated the procedure well with no immediate complications. ? ? ?

## 2021-11-25 NOTE — Anesthesia Preprocedure Evaluation (Signed)
Anesthesia Evaluation  ? ? ?Reviewed: ?Allergy & Precautions, Patient's Chart, lab work & pertinent test results, Unable to perform ROS - Chart review onlyPreop documentation limited or incomplete due to emergent nature of procedure. ? ?Airway ? ? ? ? ? ? ? Dental ?  ?Pulmonary ?asthma , pneumonia,  ?  ? ? ? ? ? ? ? Cardiovascular ?hypertension, Pt. on home beta blockers and Pt. on medications ?+ angina + CAD and + Past MI  ? ? ?Echo 07/2021 ??1. Left ventricular ejection fraction, by estimation, is 60 to 65%. The left ventricle has normal function. The left ventricle has no regional wall motion abnormalities. Left ventricular diastolic parameters are consistent with Grade I diastolic dysfunction (impaired relaxation).  ??2. Right ventricular systolic function is normal. The right ventricular size is normal. Tricuspid regurgitation signal is inadequate for assessing PA pressure.  ??3. Left atrial size was mildly dilated.  ??4. The mitral valve is normal in structure. Trivial mitral valve regurgitation. No evidence of mitral stenosis.  ??5. The aortic valve is tricuspid. Aortic valve regurgitation is not visualized. Aortic valve sclerosis/calcification is present, without any evidence of aortic stenosis.  ??6. The inferior vena cava is normal in size with greater than 50% respiratory variability, suggesting right atrial pressure of 3 mmHg.  ?  ?Neuro/Psych ? Headaches, PSYCHIATRIC DISORDERS Anxiety  Neuromuscular disease   ? GI/Hepatic ?Neg liver ROS, hiatal hernia, PUD, GERD  ,  ?Endo/Other  ?negative endocrine ROS ? Renal/GU ?Renal disease  ? ?  ?Musculoskeletal ? ?(+) Arthritis , Fibromyalgia - ? Abdominal ?  ?Peds ? Hematology ? ?(+) Blood dyscrasia, anemia ,   ?Anesthesia Other Findings ? ? Reproductive/Obstetrics ? ?  ? ? ? ? ? ? ? ? ? ? ? ? ? ?  ?  ? ? ? ? ? ? ? ? ?Anesthesia Physical ?Anesthesia Plan ? ?ASA: 3 and emergent ? ?Anesthesia Plan: General  ? ?Post-op Pain  Management:   ? ?Induction: Intravenous ? ?PONV Risk Score and Plan:  ? ?Airway Management Planned: Oral ETT ? ?Additional Equipment: Arterial line ? ?Intra-op Plan:  ? ?Post-operative Plan: Possible Post-op intubation/ventilation ? ?Informed Consent:  ? ?Plan Discussed with:  ? ?Anesthesia Plan Comments:   ? ? ? ? ? ? ?Anesthesia Quick Evaluation ? ?

## 2021-11-26 ENCOUNTER — Encounter (HOSPITAL_COMMUNITY): Payer: Self-pay | Admitting: Vascular Surgery

## 2021-11-26 DIAGNOSIS — I97638 Postprocedural hematoma of a circulatory system organ or structure following other circulatory system procedure: Secondary | ICD-10-CM

## 2021-11-26 LAB — BPAM RBC
Blood Product Expiration Date: 202305272359
Blood Product Expiration Date: 202305272359
Blood Product Expiration Date: 202305272359
Blood Product Expiration Date: 202305272359
ISSUE DATE / TIME: 202304241622
ISSUE DATE / TIME: 202304241622
ISSUE DATE / TIME: 202304241703
ISSUE DATE / TIME: 202304241703
Unit Type and Rh: 5100
Unit Type and Rh: 5100
Unit Type and Rh: 5100
Unit Type and Rh: 5100

## 2021-11-26 LAB — BASIC METABOLIC PANEL
Anion gap: 7 (ref 5–15)
BUN: 20 mg/dL (ref 8–23)
CO2: 19 mmol/L — ABNORMAL LOW (ref 22–32)
Calcium: 7.1 mg/dL — ABNORMAL LOW (ref 8.9–10.3)
Chloride: 108 mmol/L (ref 98–111)
Creatinine, Ser: 0.76 mg/dL (ref 0.44–1.00)
GFR, Estimated: >60 mL/min (ref >60)
Glucose, Bld: 132 mg/dL — ABNORMAL HIGH (ref 70–99)
Potassium: 3.7 mmol/L (ref 3.5–5.1)
Sodium: 134 mmol/L — ABNORMAL LOW (ref 135–145)

## 2021-11-26 LAB — LIPID PANEL
Cholesterol: 143 mg/dL (ref 0–200)
HDL: 20 mg/dL — ABNORMAL LOW (ref >40)
LDL Cholesterol: 96 mg/dL (ref 0–99)
Total CHOL/HDL Ratio: 7.2 RATIO
Triglycerides: 135 mg/dL (ref <150)
VLDL: 27 mg/dL (ref 0–40)

## 2021-11-26 LAB — TYPE AND SCREEN
ABO/RH(D): O POS
Antibody Screen: NEGATIVE
Unit division: 0
Unit division: 0
Unit division: 0
Unit division: 0

## 2021-11-26 LAB — CBC
HCT: 37.4 % (ref 36.0–46.0)
Hemoglobin: 13.3 g/dL (ref 12.0–15.0)
MCH: 30.7 pg (ref 26.0–34.0)
MCHC: 35.6 g/dL (ref 30.0–36.0)
MCV: 86.4 fL (ref 80.0–100.0)
Platelets: 142 10*3/uL — ABNORMAL LOW (ref 150–400)
RBC: 4.33 MIL/uL (ref 3.87–5.11)
RDW: 15.4 % (ref 11.5–15.5)
WBC: 18.5 10*3/uL — ABNORMAL HIGH (ref 4.0–10.5)
nRBC: 0.1 % (ref 0.0–0.2)

## 2021-11-26 LAB — BLOOD PRODUCT ORDER (VERBAL) VERIFICATION

## 2021-11-26 MED ORDER — TICAGRELOR 90 MG PO TABS
180.0000 mg | ORAL_TABLET | Freq: Once | ORAL | Status: AC
  Administered 2021-11-26: 180 mg via ORAL
  Filled 2021-11-26: qty 2

## 2021-11-26 MED ORDER — DIGESTIVE ENZYME PO CAPS
1.0000 | ORAL_CAPSULE | Freq: Every day | ORAL | Status: DC
  Administered 2021-11-27: 1 via ORAL

## 2021-11-26 MED ORDER — CHLORHEXIDINE GLUCONATE CLOTH 2 % EX PADS
6.0000 | MEDICATED_PAD | Freq: Every day | CUTANEOUS | Status: DC
  Administered 2021-11-27: 6 via TOPICAL

## 2021-11-26 MED ORDER — ESOMEPRAZOLE MAGNESIUM 20 MG PO CPDR
40.0000 mg | DELAYED_RELEASE_CAPSULE | Freq: Every day | ORAL | Status: DC | PRN
  Administered 2021-11-26: 40 mg via ORAL
  Filled 2021-11-26 (×3): qty 2

## 2021-11-26 MED ORDER — TICAGRELOR 90 MG PO TABS
90.0000 mg | ORAL_TABLET | Freq: Two times a day (BID) | ORAL | Status: DC
  Administered 2021-11-26 – 2021-11-27 (×2): 90 mg via ORAL
  Filled 2021-11-26 (×2): qty 1

## 2021-11-26 NOTE — Progress Notes (Signed)
PHARMACIST LIPID MONITORING ? ? ?Donna Bailey is a 76 y.o. female admitted on 11/25/2021 with embolizing lesion left subclavian artery.  Pharmacy has been consulted to optimize lipid-lowering therapy with the indication of secondary prevention for clinical ASCVD. ? ?Recent Labs: ? ?Lipid Panel (last 6 months):   ?Lab Results  ?Component Value Date  ? CHOL 143 11/26/2021  ? TRIG 135 11/26/2021  ? HDL 20 (L) 11/26/2021  ? CHOLHDL 7.2 11/26/2021  ? VLDL 27 11/26/2021  ? [place] 96 11/26/2021  ? ? ?Hepatic function panel (last 6 months):   ?Lab Results  ?Component Value Date  ? AST 15 11/08/2021  ? ALT 22 11/08/2021  ? ALKPHOS 76 11/08/2021  ? BILITOT 0.5 11/08/2021  ? ? ?SCr (since admission):   ?Serum creatinine: 0.76 mg/dL 11/26/21 0331 ?Estimated creatinine clearance: 69.2 mL/min ? ?Current therapy and lipid therapy tolerance ?Current lipid-lowering therapy: none ?Previous lipid-lowering therapies (if applicable): multiple statins, zetia, pcsk9i ?Documented or reported allergies or intolerances to lipid-lowering therapies (if applicable): multiple statins, zetia, pcsk9i ? ?Assessment:   ?Patient prefers no changes in lipid-lowering therapy at this time due to multiple intolerances and allergies to medications  and extremely high copays on other medications ? ?Plan:   ? ?1.Statin intensity (high intensity recommended for all patients regardless of the LDL):  Statin intolerance noted. No statin changes due to serious side effects (ex. Myalgias with at least 2 different statins). ? ?2.Add ezetimibe (if any one of the following):   Cannot tolerate statin at any dose. ? ?3.Refer to lipid clinic:   No ? ?4.Follow-up with:  Cardiology provider - Freada Bergeron, MD ? ?5.Follow-up labs after discharge:  No changes in lipid therapy, repeat a lipid panel in one year.    ? ?Erin Hearing PharmD., BCPS ?Clinical Pharmacist ?11/26/2021 1:52 PM ? ?

## 2021-11-26 NOTE — Op Note (Signed)
? ? ?  NAME: [name]    MRN: [id number] ?DOB: Apr 13, 1946    DATE OF OPERATION: 11/26/2021 ? ?PREOP DIAGNOSIS:   ? ?Hematoma in the right groin with hypotension and pressor requirement ? ?POSTOP DIAGNOSIS:   ? ?Same ? ?PROCEDURE:  ?  ?Right groin exploration, primary repair of right common femoral arteriotomy ? ?SURGEON: Broadus John ? ?ASSIST: N, MD ? ?ANESTHESIA: General ? ?EBL: 63m ? ?INDICATIONS:  ? ? Donna NICOLSONis a 76y.o. female who presented today for left upper extremity angiography from right groin access resulting in left subclavian artery stenting.  Postoperatively, she was noted to have a hematoma in the right groin.  This continued to increase in size.  I assessed her at the request of Dr. CDonzetta Mattersand found her to have significant hypotension, with a pressor requirement.  I was concerned that she could have active bleeding in the groin and due to her large size, manual pressure was not sufficient.  Therefore, I elected to take JRomie Minusto the OR in an effort to expose, and primarily repair the common femoral artery. ? ?FINDINGS:  ? ?Large hematoma, arterial access site hemostatic ? ?TECHNIQUE:  ? ?Patient was brought to the OR laid in supine position.  General anesthesia was induced and patient was prepped and draped in standard fashion.  Ultrasound was brought into the field and the common femoral artery marked, next an oblique incision was made in the right groin and taken down to the common femoral artery.  At this location, the artery was explored demonstrating a small arteriotomy from the previous percutaneous access.  Upon manipulation, the thrombotic plug dislodged, leading to bleeding.  A 5-0 Prolene suture was placed with excellent effect.  There was a pulse proximally and distally with a signal in the foot.  Hematoma was evacuated, hemostasis achieved with use of cautery and thrombin product, but the wound bed was irrigated and and a drain laced.  [place] closed in layers of  2-0 Vicryl suture with staples at the skin.  She is at high risk of wound breakdown due to her body habitus and the size of her hematoma.  We will continue to monitor this. ? ?Given the complexity of the case a first assistant was necessary in order to expedient the procedure and safely perform the technical aspects of the operation. ? ?JMacie Burows MD ?[facility]? ?DATE OF DICTATION:   11/26/2021 ? ?

## 2021-11-26 NOTE — Evaluation (Signed)
Physical Therapy Evaluation ?Patient Details ?Name: Donna Bailey ?MRN: [id number] ?DOB: 1946/02/22 ?Today's Date: 11/26/2021 ? ?History of Present Illness ? 76 y.o. female is s/p L subclavian artery stenting with groin exploration and repair of R CFA pmh aneurysm of R eye , anxiety, CAD, CKD, fibromyalgia, HTN, insomnia, kidney stone, migraines, mI, OA, peripheral neuropathy, scolisosis, wheat allergy, ankle surgy, bild hand surg,  ?Clinical Impression ? PTA, pt lives with with her spouse and uses a hurrycane for mobility. Has history of falls. Pt is pleasant and motivated to participate in therapy session. Reports fair pain control with premedication. Pt requiring min assist (+2 safety initially) for functional mobility. Ambulating 150 ft with a walker. SpO2 96% on RA, HR peak 105 bpm, BP 167/94. Recommend HHPT at discharge. ?   ? ?Recommendations for follow up therapy are one component of a multi-disciplinary discharge planning process, led by the attending physician.  Recommendations may be updated based on patient status, additional functional criteria and insurance authorization. ? ?Follow Up Recommendations Home health PT ? ?  ?Assistance Recommended at Discharge PRN  ?Patient can return home with the following ? A little help with walking and/or transfers;A little help with bathing/dressing/bathroom;Assistance with cooking/housework;Assist for transportation;Help with stairs or ramp for entrance ? ?  ?Equipment Recommendations None recommended by PT  ?Recommendations for Other Services ?    ?  ?Functional Status Assessment Patient has had a recent decline in their functional status and demonstrates the ability to make significant improvements in function in a reasonable and predictable amount of time.  ? ?  ?Precautions / Restrictions Precautions ?Precautions: Fall ?Precaution Comments: Aline, ?Restrictions ?Weight Bearing Restrictions: No  ? ?  ? ?Mobility ? Bed Mobility ?Overal bed mobility: Needs  Assistance ?Bed Mobility: Supine to Sit, Rolling ?Rolling: Min assist ?  ?Supine to sit: +2 for physical assistance, Mod assist ?  ?  ?General bed mobility comments: pt requires increased time and step my step cues. pt reports hx of back fracture and pain with bed mobility ?  ? ?Transfers ?Overall transfer level: Needs assistance ?Equipment used: Rolling walker (2 wheels) ?Transfers: Sit to/from Stand ?Sit to Stand: +2 physical assistance, Min assist ?  ?  ?  ?  ?  ?General transfer comment: pt requires (A) to elevate from bed surface. pt static standing with RW with dependence on RW ?  ? ?Ambulation/Gait ?Ambulation/Gait assistance: Min assist, +2 safety/equipment ?Gait Distance (Feet): 150 Feet ?Assistive device: Rolling walker (2 wheels) ?Gait Pattern/deviations: Step-through pattern, Decreased stride length ?Gait velocity: decreased ?  ?  ?General Gait Details: Cues for activity pacing, sequencing/technique, light minA for balance. Chair follow utilized but not needed ? ?Stairs ?  ?  ?  ?  ?  ? ?Wheelchair Mobility ?  ? ?Modified Rankin (Stroke Patients Only) ?  ? ?  ? ?Balance Overall balance assessment: Mild deficits observed, not formally tested ?  ?  ?  ?  ?  ?  ?  ?  ?  ?  ?  ?  ?  ?  ?  ?  ?  ?  ?   ? ? ? ?Pertinent Vitals/Pain Pain Assessment ?Pain Assessment: Faces ?Faces Pain Scale: Hurts little more ?Pain Location: R groin ?Pain Descriptors / Indicators: Discomfort, Grimacing ?Pain Intervention(s): Limited activity within patient's tolerance, Monitored during session, Premedicated before session  ? ? ?Home Living Family/patient expects to be discharged to:: Private residence ?Living Arrangements: Spouse/significant other ?Available Help at Discharge: Family ?Type of Home:  House ?Home Access: Ramped entrance;Stairs to enter ?Entrance Stairs-Rails: Right ?Entrance Stairs-Number of Steps: 7 ?  ?Home Layout: Able to live on main level with bedroom/bathroom ?Home Equipment: Conservation officer, nature (2  wheels);Rollator (4 wheels);Other (comment) (hurrycane) ?   ?  ?Prior Function Prior Level of Function : Needs assist ?  ?  ?  ?  ?  ?  ?Mobility Comments: had progressed to hurrycane, hx of ~4-5 falls since December ?ADLs Comments: modI for tub/shower transfers ?  ? ? ?Hand Dominance  ? Dominant Hand: Right ? ?  ?Extremity/Trunk Assessment  ? Upper Extremity Assessment ?Upper Extremity Assessment: LUE deficits/detail ?LUE Deficits / Details: has purple finger tips 2-5 that has been treated prior to admission with increased warmth and color after surgery per patient and MD Donzetta Matters ?  ? ?Lower Extremity Assessment ?Lower Extremity Assessment: Generalized weakness ?  ? ?Cervical / Trunk Assessment ?Cervical / Trunk Assessment: Kyphotic  ?Communication  ? Communication: No difficulties  ?Cognition Arousal/Alertness: Awake/alert ?Behavior During Therapy: [facility] for tasks assessed/performed ?Overall Cognitive Status: Within Functional Limits for tasks assessed ?  ?  ?  ?  ?  ?  ?  ?  ?  ?  ?  ?  ?  ?  ?  ?  ?General Comments: Lik ?  ?  ? ?  ?General Comments General comments (skin integrity, edema, etc.): VSS ? ?  ?Exercises General Exercises - Lower Extremity ?Heel Slides: Right, 10 reps, Supine ?Hip ABduction/ADduction: Right, 10 reps, Supine  ? ?Assessment/Plan  ?  ?PT Assessment Patient needs continued PT services  ?PT Problem List Decreased strength;Decreased activity tolerance;Decreased balance;Decreased mobility;Pain ? ?   ?  ?PT Treatment Interventions DME instruction;Gait training;Functional mobility training;Therapeutic exercise;Therapeutic activities;Balance training;Patient/family education;Stair training   ? ?PT Goals (Current goals can be found in the Care Plan section)  ?Acute Rehab PT Goals ?Patient Stated Goal: to get moving ?PT Goal Formulation: With patient ?Time For Goal Achievement: 12/10/21 ?Potential to Achieve Goals: Good ? ?  ?Frequency Min 3X/week ?  ? ? ?Co-evaluation PT/OT/SLP  Co-Evaluation/Treatment: Yes ?Reason for Co-Treatment: For patient/therapist safety;To address functional/ADL transfers ?  ?  ?  ? ? ?  ?AM-PAC PT "6 Clicks" Mobility  ?Outcome Measure Help needed turning from your back to your side while in a flat bed without using bedrails?: A Little ?Help needed moving from lying on your back to sitting on the side of a flat bed without using bedrails?: A Little ?Help needed moving to and from a bed to a chair (including a wheelchair)?: A Little ?Help needed standing up from a chair using your arms (e.g., wheelchair or bedside chair)?: A Little ?Help needed to walk in hospital room?: A Little ?Help needed climbing 3-5 steps with a railing? : A Lot ?6 Click Score: 17 ? ?  ?End of Session   ?Activity Tolerance: Patient tolerated treatment well ?Patient left: in chair;with call bell/phone within reach;with chair alarm set ?Nurse Communication: Mobility status ?PT Visit Diagnosis: Unsteadiness on feet (R26.81);Muscle weakness (generalized) (M62.81);History of falling (Z91.81);Difficulty in walking, not elsewhere classified (R26.2);Pain ?Pain - Right/Left: Right ?Pain - part of body:  (groin) ?  ? ?Time: 1517-6160 ?PT Time Calculation (min) (ACUTE ONLY): 46 min ? ? ?Charges:   PT Evaluation ?$PT Eval Moderate Complexity: 1 Mod ?  ?  ?   ? ? ?Wyona Almas, PT, DPT ?Acute Rehabilitation Services ?Pager [phone] ?Office [phone] ? ? ?Donna Bailey ?11/26/2021, 12:10 PM ? ?

## 2021-11-26 NOTE — Evaluation (Signed)
Occupational Therapy Evaluation ?Patient Details ?Name: Donna Bailey ?MRN: [id number] ?DOB: October 08, 1945 ?Today's Date: 11/26/2021 ? ? ?History of Present Illness 76 y.o. female is s/p L subclavian artery stenting with groin exploration and repair of R CFA pmh aneurysm of R eye , anxiety, CAD, CKD, fibromyalgia, HTN, insomnia, kidney stone, migraines, mI, OA, peripheral neuropathy, scolisosis, wheat allergy, ankle surgy, bild hand surg,  ? ?Clinical Impression ?  ?Patient is s/p L subclavian artery stenting surgery resulting in functional limitations due to the deficits listed below (see OT problem list). Pt currently with bruising throughout R hip area and groining. Pt reports soreness at the pelvic area being the focal point of any discomfort. Pt progressed from bed to bathroom with RW. Pt has an allergy so all linen ensured to be HPO.  Patient will benefit from skilled OT acutely to increase independence and safety with ADLS to allow discharge hhot. ?  ?   ? ?Recommendations for follow up therapy are one component of a multi-disciplinary discharge planning process, led by the attending physician.  Recommendations may be updated based on patient status, additional functional criteria and insurance authorization.  ? ?Follow Up Recommendations ? Home health OT  ?  ?Assistance Recommended at Discharge PRN  ?Patient can return home with the following A little help with walking and/or transfers;A little help with bathing/dressing/bathroom;Assist for transportation ? ?  ?Functional Status Assessment ? Patient has had a recent decline in their functional status and demonstrates the ability to make significant improvements in function in a reasonable and predictable amount of time.  ?Equipment Recommendations ? BSC/3in1 (wide bari. pt reports standard 3n1 she is unable to use due to hips)  ?  ?Recommendations for Other Services   ? ? ?  ?Precautions / Restrictions Precautions ?Precautions: Fall ?Precaution Comments:  Aline, ?Restrictions ?Weight Bearing Restrictions: No  ? ?  ? ?Mobility Bed Mobility ?Overal bed mobility: Needs Assistance ?Bed Mobility: Supine to Sit, Rolling ?Rolling: Min assist ?  ?Supine to sit: +2 for physical assistance, Mod assist ?  ?  ?General bed mobility comments: pt requires increased time and step my step cues. pt reports hx of back fracture and pain with bed mobility ?  ? ?Transfers ?Overall transfer level: Needs assistance ?Equipment used: Rolling walker (2 wheels) ?Transfers: Sit to/from Stand ?Sit to Stand: +2 physical assistance, Mod assist ?  ?  ?  ?  ?  ?General transfer comment: pt requires (A) to elevate from bed surface. pt static standing with RW with dependence on RW ?  ? ?  ?Balance Overall balance assessment: Mild deficits observed, not formally tested ?  ?  ?  ?  ?  ?  ?  ?  ?  ?  ?  ?  ?  ?  ?  ?  ?  ?  ?   ? ?ADL either performed or assessed with clinical judgement  ? ?ADL Overall ADL's : Needs assistance/impaired ?Eating/Feeding: Set up ?  ?Grooming: Applying deodorant ?  ?Upper Body Bathing: Set up ?  ?Lower Body Bathing: Moderate assistance ?  ?Upper Body Dressing : Set up ?  ?Lower Body Dressing: Moderate assistance ?  ?Toilet Transfer: Minimal assistance;Ambulation;BSC/3in1;Rolling walker (2 wheels) ?  ?Toileting- Clothing Manipulation and Hygiene: Total assistance;Sit to/from stand ?  ?  ?  ?Functional mobility during ADLs: Minimal assistance;Rolling walker (2 wheels) ?   ? ? ? ?Vision Baseline Vision/History: 1 Wears glasses ?Ability to See in Adequate Light: 0 Adequate ?   ?   ?  Perception   ?  ?Praxis   ?  ? ?Pertinent Vitals/Pain Pain Assessment ?Pain Assessment: Faces ?Faces Pain Scale: Hurts little more ?Pain Location: R groin ?Pain Descriptors / Indicators: Discomfort, Grimacing ?Pain Intervention(s): Monitored during session, Premedicated before session, Repositioned  ? ? ? ?Hand Dominance Right ?  ?Extremity/Trunk Assessment Upper Extremity Assessment ?Upper Extremity  Assessment: LUE deficits/detail ?LUE Deficits / Details: has purple finger tips 2-5 that has been treated prior to admission with increased warmth and color after surgery per patient and MD Donzetta Matters ?  ?Lower Extremity Assessment ?Lower Extremity Assessment: Defer to PT evaluation ?  ?Cervical / Trunk Assessment ?Cervical / Trunk Assessment: Kyphotic ?  ?Communication Communication ?Communication: No difficulties ?  ?Cognition Arousal/Alertness: Awake/alert ?Behavior During Therapy: [facility] for tasks assessed/performed ?Overall Cognitive Status: Within Functional Limits for tasks assessed ?  ?  ?  ?  ?  ?  ?  ?  ?  ?  ?  ?  ?  ?  ?  ?  ?  ?  ?  ?General Comments  VSS ? ?  ?Exercises   ?  ?Shoulder Instructions    ? ? ?Home Living Family/patient expects to be discharged to:: Private residence ?Living Arrangements: Spouse/significant other ?Available Help at Discharge: Family ?Type of Home: House ?Home Access: Ramped entrance;Stairs to enter ?Entrance Stairs-Number of Steps: 7 ?Entrance Stairs-Rails: Right ?Home Layout: Able to live on main level with bedroom/bathroom ?  ?  ?Bathroom Shower/Tub: Tub/shower unit ?  ?Bathroom Toilet: Standard ?  ?  ?Home Equipment: Conservation officer, nature (2 wheels);Rollator (4 wheels);Other (comment) (hurrycane) ?  ?  ?  ? ?  ?Prior Functioning/Environment Prior Level of Function : Needs assist ?  ?  ?  ?  ?  ?  ?Mobility Comments: had progressed to hurrycane, hx of ~4-5 falls since December ?ADLs Comments: modI for tub/shower transfers ?  ? ?  ?  ?OT Problem List: Decreased strength;Decreased activity tolerance;Impaired balance (sitting and/or standing);Decreased knowledge of use of DME or AE;Decreased knowledge of precautions;Cardiopulmonary status limiting activity;Obesity ?  ?   ?OT Treatment/Interventions: Self-care/ADL training;Therapeutic exercise;Neuromuscular education;Energy conservation;DME and/or AE instruction;Therapeutic activities;Patient/family education;Balance training  ?  ?OT  Goals(Current goals can be found in the care plan section) Acute Rehab OT Goals ?Patient Stated Goal: to go home soon ?OT Goal Formulation: With patient ?Time For Goal Achievement: 12/10/21 ?Potential to Achieve Goals: Good  ?OT Frequency: Min 2X/week ?  ? ?Co-evaluation   ?  ?  ?  ?  ? ?  ?AM-PAC OT "6 Clicks" Daily Activity     ?Outcome Measure Help from another person eating meals?: A Little ?Help from another person taking care of personal grooming?: A Little ?Help from another person toileting, which includes using toliet, bedpan, or urinal?: A Lot ?Help from another person bathing (including washing, rinsing, drying)?: A Lot ?Help from another person to put on and taking off regular upper body clothing?: A Little ?Help from another person to put on and taking off regular lower body clothing?: A Lot ?6 Click Score: 15 ?  ?End of Session Equipment Utilized During Treatment: Gait belt;Rolling walker (2 wheels) ?Nurse Communication: Mobility status;Precautions ? ?Activity Tolerance: Patient tolerated treatment well ?Patient left: in chair;with call bell/phone within reach;with chair alarm set ? ?OT Visit Diagnosis: Unsteadiness on feet (R26.81);Muscle weakness (generalized) (M62.81)  ?              ?Time: 8144-8185 ?OT Time Calculation (min): 40 min ?Charges:  OT General Charges ?$  OT Visit: 1 Visit ?OT Evaluation ?$OT Eval Moderate Complexity: 1 Mod ?OT Treatments ?$Self Care/Home Management : 8-22 mins ? ? ?Brynn, OTR/L  ?Acute Rehabilitation Services ?Office: [phone] ?. ? ? ?Jeri Modena ?11/26/2021, 11:59 AM ?

## 2021-11-26 NOTE — Anesthesia Postprocedure Evaluation (Signed)
Anesthesia Post Note ? ?[name] ? ?Procedure(s) Performed: RIGHT GROIN EXPLORATION (Right) ? ?  ? ?Patient location during evaluation: PACU ?Anesthesia Type: General ?Level of consciousness: sedated and patient cooperative ?Pain management: pain level controlled ?Vital Signs Assessment: post-procedure vital signs reviewed and stable ?Respiratory status: spontaneous breathing ?Cardiovascular status: stable ?Anesthetic complications: no ? ? ?No notable events documented. ? ?Last Vitals:  ?Vitals:  ? 11/26/21 0700 11/26/21 0740  ?BP: (!) 168/63   ?Pulse: 87 85  ?Resp: 17 14  ?Temp:  36.8 ?C  ?SpO2: 95% 94%  ?  ?Last Pain:  ?Vitals:  ? 11/26/21 0740  ?TempSrc: Oral  ?PainSc:   ? ? ?  ?  ?  ?  ?  ?  ? ?Nolon Nations ? ? ? ? ?

## 2021-11-26 NOTE — Progress Notes (Addendum)
?  Progress Note ? ? ? ?11/26/2021 ?7:57 AM ?1 Day Post-Op ? ?Subjective:  no complaints ? ? ?Vitals:  ? 11/26/21 0700 11/26/21 0740  ?BP: (!) 168/63   ?Pulse: 87 85  ?Resp: 17 14  ?Temp:  98.2 ?F (36.8 ?C)  ?SpO2: 95% 94%  ? ?Physical Exam: ?Lungs:  non labored ?Incisions:  R groin incision c/d/I; large area of ecchymosis but soft ?Extremities:  symmetrical PT pulses; palpable L radial pulse ?Neurologic: A&O ? ?CBC ?   ?Component Value Date/Time  ? WBC 18.5 (H) 11/26/2021 0331  ? RBC 4.33 11/26/2021 0331  ? HGB 13.3 11/26/2021 0331  ? HGB 13.4 05/22/2021 1113  ? HCT 37.4 11/26/2021 0331  ? HCT 41.5 05/22/2021 1113  ? PLT 142 (L) 11/26/2021 0331  ? PLT 384 05/22/2021 1113  ? MCV 86.4 11/26/2021 0331  ? MCV 84 05/22/2021 1113  ? MCH 30.7 11/26/2021 0331  ? MCHC 35.6 11/26/2021 0331  ? RDW 15.4 11/26/2021 0331  ? RDW 14.1 05/22/2021 1113  ? LYMPHSABS 1.9 07/15/2021 1047  ? LYMPHSABS 2.3 06/13/2019 1136  ? MONOABS 1.0 07/15/2021 1047  ? EOSABS 0.2 07/15/2021 1047  ? EOSABS 0.1 06/13/2019 1136  ? BASOSABS 0.0 07/15/2021 1047  ? BASOSABS 0.1 06/13/2019 1136  ? ? ?BMET ?   ?Component Value Date/Time  ? NA 134 (L) 11/26/2021 0331  ? NA 136 10/04/2021 1227  ? K 3.7 11/26/2021 0331  ? CL 108 11/26/2021 0331  ? CO2 19 (L) 11/26/2021 0331  ? GLUCOSE 132 (H) 11/26/2021 0331  ? BUN 20 11/26/2021 0331  ? BUN 29 (H) 10/04/2021 1227  ? CREATININE 0.76 11/26/2021 0331  ? CALCIUM 7.1 (L) 11/26/2021 0331  ? GFRNONAA >60 11/26/2021 0331  ? GFRAA 58 (L) 06/13/2019 1134  ? ? ?INR ?   ?Component Value Date/Time  ? INR 1.1 04/26/2019 0755  ? ? ? ?Intake/Output Summary (Last 24 hours) at 11/26/2021 0757 ?Last data filed at 11/26/2021 0600 ?Gross per 24 hour  ?Intake 4586.39 ml  ?Output 450 ml  ?Net 4136.39 ml  ? ? ? ?Assessment/Plan:  76 y.o. female is s/p L subclavian artery stenting with groin exploration and repair of R CFA 1 Day Post-Op  ? ?BLE well perfused with palpable PT pulses ?L hand well perfused with palpable L radial pulse ?R groin  soft; continue drain ?Ok to work with therapy today, can also likely transfer to 4e ? ? ?Dagoberto Ligas, PA-C ?[facility] ?[phone] ?11/26/2021 ?7:57 AM ? ? ?I have independently interviewed and examined patient and agree with PA assessment and plan above.  ? ?Elgie Maziarz C. Donzetta Matters, MD ?[facility] ?Office: [phone] ?Pager: [phone] ? ?

## 2021-11-26 NOTE — Progress Notes (Signed)
Patient is voicing concern about oral medications and would like confirmation that there are no artificial sweetners or corn by-products in them. This nurse notified unit pharmacist who will discuss with patient.  ?

## 2021-11-27 ENCOUNTER — Ambulatory Visit: Payer: Medicare Other | Admitting: Physical Therapy

## 2021-11-27 ENCOUNTER — Other Ambulatory Visit (HOSPITAL_COMMUNITY): Payer: Self-pay

## 2021-11-27 ENCOUNTER — Encounter (HOSPITAL_COMMUNITY): Payer: Self-pay | Admitting: Vascular Surgery

## 2021-11-27 MED ORDER — TICAGRELOR 90 MG PO TABS
90.0000 mg | ORAL_TABLET | Freq: Two times a day (BID) | ORAL | 3 refills | Status: DC
  Filled 2021-11-27: qty 60, 30d supply, fill #0

## 2021-11-27 MED ORDER — OXYCODONE HCL 5 MG PO TABS
5.0000 mg | ORAL_TABLET | Freq: Four times a day (QID) | ORAL | 0 refills | Status: DC | PRN
  Filled 2021-11-27: qty 30, 4d supply, fill #0

## 2021-11-27 NOTE — Progress Notes (Addendum)
[facility] [place] ? ?Subjective  - now new complaints about right groin.  It feels better.  She is most concerned about her back. ? ? ?Objective ?(!) 165/65 ?78 ?98 ?F (36.7 ?C) (Oral) ?17 ?94% ? ?Intake/Output Summary (Last 24 hours) at 11/27/2021 0730 ?Last data filed at 11/27/2021 0400 ?Gross per 24 hour  ?Intake 1295.7 ml  ?Output 340 ml  ?Net 955.7 ml  ? ? ?Right groin with ecchymosis, soft to palpation drain OP 140 ml ?Palpable PT pulses B ?Palpable left radial artery with intact motor and sensation, finger tips dusky, but improved per patient ?Leukocytosis decreasing, afebrile ? ? ? ?Assessment/Planning: ?POD # 2 76 y.o. female is s/p L subclavian artery stenting with groin exploration and repair of R CFA ? ?Heparin SQ for DVT prophylaxis ?Brilinta restarted, no increased or re accumulation of hematoma right groin. ?Will D/C drain  ?Cont. To work on mobility.  PT/OT recommending HH will place order. ?No equipment needs per patient. ?Plan for discharge home later today.  ?F/U with [facility] in 2-3 weeks for right groin ck and left UE. ? ? ? ?Donna Bailey ?11/27/2021 ?7:30 AM ?-- ? ?Laboratory ?Lab Results: ?Recent Labs  ?  11/25/21 ?0263 11/25/21 ?7858 11/25/21 ?1818 11/26/21 ?0331  ?WBC 21.4*  --   --  18.5*  ?HGB 8.3*   < > 14.6 13.3  ?HCT 26.7*   < > 43.0 37.4  ?PLT 293  --   --  142*  ? < > = values in this interval not displayed.  ? ?BMET ?Recent Labs  ?  11/25/21 ?1220 11/25/21 ?1652 11/25/21 ?1818 11/26/21 ?0331  ?NA 140   < > 138 134*  ?K 3.7   < > 3.8 3.7  ?CL 102  --   --  108  ?CO2  --   --   --  19*  ?GLUCOSE 102*  --   --  132*  ?BUN 30*  --   --  20  ?CREATININE 0.90  --   --  0.76  ?CALCIUM  --   --   --  7.1*  ? < > = values in this interval not displayed.  ? ? ?COAG ?Lab Results  ?Component Value Date  ? INR 1.1 04/26/2019  ? INR 1.0 11/25/2018  ? INR 0.90 03/20/2018  ? ?No results found for: PTT ? ?I have independently interviewed and examined patient and agree with  PA assessment and plan above.  Drain with minimal output and can be removed today from right groin.  Right groin incision has a clean dressing on will be tenuous for healing given the significance of hematoma.  Her left hand appears to have improved flow with strongly palpable left radial pulse. ? ?Laveta Gilkey C. Donzetta Matters, MD ?[facility] ?Office: [phone] ?Pager: [phone] ? ? ? ?

## 2021-11-27 NOTE — TOC Transition Note (Signed)
Transition of Care (TOC) - CM/SW Discharge Note ?Marvetta Gibbons Therapist, sports, BSN ?Transitions of Care ?Unit 4E- RN Case Manager ?See Treatment Team for direct phone #  ? ? ?Patient Details  ?Name: SHAQUELA WEICHERT ?MRN: [id number] ?Date of Birth: June 16, 1946 ? ?Transition of Care (TOC) CM/SW Contact:  ?Dahlia Client, Romeo Rabon, RN ?Phone Number: ?11/27/2021, 11:39 AM ? ? ?Clinical Narrative:    ?Pt stable for transition home today. Orders placed for HHPT/OT. CM in to speak with pt at bedside.  ?List provided for [facility] choice Per CMS guidelines from medicare.gov website with star ratings (copy placed in shadow chart) - Pt voiced that her PCP had tried to set up [facility] back in Feb but no one ever came out and she does not know name of agency services where set up with. She also voices that her PCP has ordered a hospital bed but due to her many allergies she has not been able to get this set up either. Patient needs a special organic mattress that DME agencies to do not carry and she will most likely have to purchase out of pocket and see if Medicare will reimburse her for or part of cost. Encouraged her to work through her insurance for how to do that.  ?After lengthy discussion on her allergies, pt has decided that outpt therapy services may be best instead of having HH come out. She reports that her PCP has made a referral to outpt- per Epic it appears she has referral to [place] location with upcoming PT sessions scheduled- will send referral for OT as well and update referral to [facility]. Pt agreeable and reports her husband can transport.   ? ?Referral sent via epic to [place] for outpt OT and (PT to continue), with no HH as per pt request-  they will contact her to schedule ? ? ?Final next level of care: OP Rehab ?Barriers to Discharge: No Barriers Identified ? ? ?Patient Goals and CMS Choice ?Patient states their goals for this hospitalization and ongoing recovery are:: return home ?CMS Medicare.gov Compare Post Acute  Care list provided to:: Patient ?Choice offered to / list presented to : Patient ? ?Discharge Placement ?  ?           ?  ? Home w/ outpt PT/OT ?  ?  ? ?Discharge Plan and Services ?  ?Discharge Planning Services: CM Consult ?Post Acute Care Choice: Home Health          ?DME Arranged: N/A ?DME Agency: NA ?  ?  ?  ?HH Arranged: PT, OT, Patient Refused HH (pt prefers outpt instead of [facility]) ?[place] Agency: NA ?  ?  ?  ? ?Social Determinants of Health (SDOH) Interventions ?  ? ? ?Readmission Risk Interventions ? ?  11/27/2021  ? 11:39 AM  ?Readmission Risk Prevention Plan  ?Transportation Screening Complete  ?PCP or Specialist Appt within 5-7 Days Complete  ?Home Care Screening Complete  ?Medication Review (RN CM) Complete  ? ? ? ? ? ?

## 2021-11-27 NOTE — Plan of Care (Signed)
  Problem: Education: Goal: Knowledge of General Education information will improve Description: Including pain rating scale, medication(s)/side effects and non-pharmacologic comfort measures Outcome: Adequate for Discharge   Problem: Health Behavior/Discharge Planning: Goal: Ability to manage health-related needs will improve Outcome: Adequate for Discharge   Problem: Clinical Measurements: Goal: Ability to maintain clinical measurements within normal limits will improve Outcome: Adequate for Discharge Goal: Will remain free from infection Outcome: Adequate for Discharge Goal: Diagnostic test results will improve Outcome: Adequate for Discharge Goal: Respiratory complications will improve Outcome: Adequate for Discharge Goal: Cardiovascular complication will be avoided Outcome: Adequate for Discharge   Problem: Elimination: Goal: Will not experience complications related to bowel motility Outcome: Adequate for Discharge Goal: Will not experience complications related to urinary retention Outcome: Adequate for Discharge   Problem: Pain Managment: Goal: General experience of comfort will improve Outcome: Adequate for Discharge   Problem: Safety: Goal: Ability to remain free from injury will improve Outcome: Adequate for Discharge   Problem: Skin Integrity: Goal: Risk for impaired skin integrity will decrease Outcome: Adequate for Discharge   

## 2021-11-27 NOTE — Progress Notes (Addendum)
Physical Therapy Treatment ?Patient Details ?Name: Donna Bailey ?MRN: [id number] ?DOB: 1946/03/07 ?Today's Date: 11/27/2021 ? ? ?History of Present Illness 76 y.o. female is s/p L subclavian artery stenting with groin exploration and repair of R CFA pmh aneurysm of R eye , anxiety, CAD, CKD, fibromyalgia, HTN, insomnia, kidney stone, migraines, mI, OA, peripheral neuropathy, scolisosis, wheat allergy, ankle surgy, bild hand surg, ? ?  ?PT Comments  ? ? Received pt in supine, agreeable to physical therapy session and emphasis on gait and stairs training. Pt has made progress toward her PT goals, exhibiting increased activity tolerance. Pt needing min assist for logrolling to sidelying and then to sit EOB with verbal and manual cues for proper technique. Pt powered up to stand with minA and a rolling walker for support, cues for proper posture upon standing. Took a standing break to regain balance and practiced stair training stepping up and down 1 step 7X before ambulating 20 feet in the room and 10 feet to the restroom with min assist. Pt stood at sink for several minutes to practice basic LE exercises and then returned to the bed with min assist. Assisted pt in getting dressed before being discharged. Pt left sitting EOB with her husband in the room. Pt would benefit from continued PT to increase strength and endurance to progress functional mobility goals. ?  ?Recommendations for follow up therapy are one component of a multi-disciplinary discharge planning process, led by the attending physician.  Recommendations may be updated based on patient status, additional functional criteria and insurance authorization. ? ?Follow Up Recommendations ? Home health PT ?  ?  ?Assistance Recommended at Discharge PRN  ?Patient can return home with the following A little help with walking and/or transfers;A little help with bathing/dressing/bathroom;Assistance with cooking/housework;Assist for transportation;Help with stairs  or ramp for entrance ?  ?Equipment Recommendations ? None recommended by PT  ?  ?Recommendations for Other Services   ? ? ?  ?Precautions / Restrictions Precautions ?Precautions: Fall ?Restrictions ?Weight Bearing Restrictions: No  ?  ? ?Mobility ? Bed Mobility ?Overal bed mobility: Needs Assistance ?Bed Mobility: Supine to Sit, Rolling ?Rolling: Min assist ?  ?Supine to sit: +2 for physical assistance, Mod assist ?  ?  ?General bed mobility comments: pt requires increased time and step my step cues. pt reports hx of back fracture and pain with bed mobility ?  ? ?Transfers ?Overall transfer level: Needs assistance ?Equipment used: Rolling walker (2 wheels) ?Transfers: Sit to/from Stand ?Sit to Stand: +2 physical assistance, Min assist ?  ?  ?  ?  ?  ?General transfer comment: pt requires (A) to elevate from bed surface. pt static standing with RW with dependence on RW ?  ? ?Ambulation/Gait ?Ambulation/Gait assistance: Min assist, +2 safety/equipment ?Gait Distance (Feet): 40 Feet (10 feet to restroom, 20 feet in room, 10 feet back to bed) ?Assistive device: Rolling walker (2 wheels) ?Gait Pattern/deviations: Step-through pattern, Decreased stride length ?Gait velocity: decreased ?  ?  ?General Gait Details: Cues for activity pacing, sequencing/technique, light minA for balance. ? ? ?Stairs ?Stairs: Yes ?Stairs assistance: Min assist ?Stair Management: One rail Right, With cane ?Number of Stairs: 7 ?General stair comments: pt needing minA and cues for proper foot placement to ascend and descend the stairs safely. pt LOB x 2 but was able to self correct. ? ? ?Wheelchair Mobility ?  ? ?Modified Rankin (Stroke Patients Only) ?  ? ? ?  ?Balance Overall balance assessment: Mild deficits observed, not  formally tested ?  ?  ?  ?  ?  ?  ?  ?  ?  ?  ?  ?  ?  ?  ?  ?  ?  ?  ?  ? ?  ?Cognition Arousal/Alertness: Awake/alert ?Behavior During Therapy: [facility] for tasks assessed/performed ?Overall Cognitive Status: Within Functional  Limits for tasks assessed ?  ?  ?  ?  ?  ?  ?  ?  ?  ?  ?  ?  ?  ?  ?  ?  ?  ?  ?  ? ?  ?Exercises General Exercises - Lower Extremity ?Hip ABduction/ADduction: AROM, Both, 5 reps, Standing ?Hip Flexion/Marching: AROM, Both, 5 reps, Standing ?Other Exercises ?Other Exercises: hip extension; Both; 5 reps; standing ? ?  ?General Comments   ?  ?  ? ?Pertinent Vitals/Pain Pain Assessment ?Pain Assessment: 0-10 ?Pain Score: 4  ?Pain Location: R groin ?Pain Descriptors / Indicators: Discomfort, Grimacing ?Pain Intervention(s): Monitored during session  ? ? ?Home Living   ?  ?  ?  ?  ?  ?  ?  ?  ?  ?   ?  ?Prior Function    ?  ?  ?   ? ?PT Goals (current goals can now be found in the care plan section) Acute Rehab PT Goals ?Patient Stated Goal: to get moving ?PT Goal Formulation: With patient ?Time For Goal Achievement: 12/10/21 ?Potential to Achieve Goals: Good ?Progress towards PT goals: Progressing toward goals ? ?  ?Frequency ? ? ? Min 3X/week ? ? ? ?  ?PT Plan Current plan remains appropriate  ? ? ?Co-evaluation   ?  ?  ?  ?  ? ?  ?AM-PAC PT "6 Clicks" Mobility   ?Outcome Measure ? Help needed turning from your back to your side while in a flat bed without using bedrails?: A Little ?Help needed moving from lying on your back to sitting on the side of a flat bed without using bedrails?: A Lot ?Help needed moving to and from a bed to a chair (including a wheelchair)?: A Little ?Help needed standing up from a chair using your arms (e.g., wheelchair or bedside chair)?: A Little ?Help needed to walk in hospital room?: A Little ?Help needed climbing 3-5 steps with a railing? : A Little ?6 Click Score: 17 ? ?  ?End of Session Equipment Utilized During Treatment: Gait belt ?Activity Tolerance: Patient tolerated treatment well ?Patient left: in bed;with call bell/phone within reach;with family/visitor present ?Nurse Communication: Mobility status ?PT Visit Diagnosis: Unsteadiness on feet (R26.81);Muscle weakness (generalized)  (M62.81);History of falling (Z91.81);Difficulty in walking, not elsewhere classified (R26.2);Pain ?Pain - Right/Left: Right ?Pain - part of body:  (groin) ?  ? ? ?Time: 7017-7939 ?PT Time Calculation (min) (ACUTE ONLY): 30 min ? ?Charges:  $Gait Training: 8-22 mins ?$Therapeutic Activity: 8-22 mins          ?          ? ?Wadie Lessen, SPTA ? ? ? ?Tiffany Mutch ?11/27/2021, 1:37 PM ? ?

## 2021-11-28 ENCOUNTER — Telehealth: Payer: Self-pay | Admitting: *Deleted

## 2021-11-28 NOTE — Telephone Encounter (Signed)
Patient called and left a message stating she was having continued bleeding at hematoma site and requesting an appt for next week. Called patient number 3 times and no answer it goes to voice mail left several messages for patient to call back. ?

## 2021-11-29 ENCOUNTER — Ambulatory Visit: Payer: Medicare Other | Admitting: Physical Therapy

## 2021-11-30 NOTE — Discharge Summary (Signed)
[facility] Discharge Summary ? ? ?Patient ID:  ?Donna Bailey ?MRN: [id number] ?DOB/AGE: 04/25/1946 76 y.o. ? ?Admit date: 11/25/2021 ?Discharge date: 11/27/21 ?Date of Surgery: 11/25/2021 ?Surgeon: Surgeon(s): ?Broadus John, MD ? ?Admission Diagnosis: ?PAD (peripheral artery disease) ([place]) [I73.9] ? ?Discharge Diagnoses:  ?PAD (peripheral artery disease) ([place]) [I73.9] ? ?Secondary Diagnoses: ?Past Medical History:  ?Diagnosis Date  ? Allergic rhinitis, cause unspecified   ? Anemia   ? Aneurysm of right conjunctiva   ? right eye   ? Anxiety   ? Asthma   ? Barrett's esophagus   ? CAD in native artery   ? a. 11/2017: STEMI s/p DES to prox-mid LAD; LCx stenosis managed medically. Case complicated by R forearm hematoma. b. Several subsequent caths, last in 04/2019 with stable disease // Myoview 11/2019: EF 61, normal perfusion; low risk   ? CKD (chronic kidney disease), stage II   ? Constipation   ? Cystocele   ? Deviated nasal septum   ? Diaphragmatic hernia without mention of obstruction or gangrene   ? Esophageal reflux   ? Fibromyalgia   ? H/O hiatal hernia   ? Hypertension   ? Insomnia, unspecified   ? Ischemic cardiomyopathy   ? a. EF 40-45% by echo 11/2017. // Echo 5/19: No new wall motion abnormalities, EF 65, no pericardial effusion, normal aortic root  ? Kidney stones   ? hx of pt see Dr. Risa Grill  ? Medication intolerance   ? numerous  ? Migraine   ? Myalgia and myositis, unspecified   ? Myocardial infarct ([place]) 11/13/2017  ? Myocardial infarction [facility])   ? Nuclear stress tests   ? Cardiolite 2/18: no ischemia or scar, EF 78; Low Risk  ? Osteoarthrosis, unspecified whether generalized or localized, unspecified site   ? Peripheral neuropathy   ? Pneumonia   ? hx of  ? Pure hypercholesterolemia   ? Rectocele   ? Scoliosis (and kyphoscoliosis), idiopathic   ? Statin intolerance   ? Temporomandibular joint disorders, unspecified   ? Urticaria   ? Wheat allergy   ? ? ?Procedure(s): ?RIGHT GROIN  EXPLORATION ? ?Discharged Condition: stable ? ?HPI:  ? ? ?Hospital Course:  ?[name] is a 76 y.o. female is S/P Right ?Procedure(s): ?RIGHT GROIN EXPLORATION ?She underwent PVL procedure Stent of left subclavian artery with 7 x 29 mm VBX.  She developed hypotension and expanding right groin hematoma.  She was emergently taken to the OR by Dr. Donzetta Matters and Dr. Virl Cagey.  Right groin exploration, primary repair of right common femoral arteriotomy.  JP drain was placed. ? Lower extremities well perfused post op day 2.  Right groin was soft, JP drain was discontinued and patient was stable for discharge home.  Brilinta restarted, no increased or re accumulation of hematoma right groin.  F/U with [facility] in 2-3 weeks for right groin ck and left UE.  ? ? ? ?Significant Diagnostic Studies: ?CBC ?Lab Results  ?Component Value Date  ? WBC 18.5 (H) 11/26/2021  ? HGB 13.3 11/26/2021  ? HCT 37.4 11/26/2021  ? MCV 86.4 11/26/2021  ? PLT 142 (L) 11/26/2021  ? ? ?BMET ?   ?Component Value Date/Time  ? NA 134 (L) 11/26/2021 0331  ? NA 136 10/04/2021 1227  ? K 3.7 11/26/2021 0331  ? CL 108 11/26/2021 0331  ? CO2 19 (L) 11/26/2021 0331  ? GLUCOSE 132 (H) 11/26/2021 0331  ? BUN 20 11/26/2021 0331  ? BUN 29 (H) 10/04/2021  1227  ? CREATININE 0.76 11/26/2021 0331  ? CALCIUM 7.1 (L) 11/26/2021 0331  ? GFRNONAA >60 11/26/2021 0331  ? GFRAA 58 (L) 06/13/2019 1134  ? ?COAG ?Lab Results  ?Component Value Date  ? INR 1.1 04/26/2019  ? INR 1.0 11/25/2018  ? INR 0.90 03/20/2018  ? ? ? ?Disposition:  ?Discharge to :Home ?Discharge Instructions   ? ? Call MD for:  redness, tenderness, or signs of infection (pain, swelling, bleeding, redness, odor or green/yellow discharge around incision site)   Complete by: As directed ?  ? Call MD for:  severe or increased pain, loss or decreased feeling  in affected limb(s)   Complete by: As directed ?  ? Call MD for:  temperature >100.5   Complete by: As directed ?  ? Resume previous diet   Complete by: As  directed ?  ? ?  ? ?Allergies as of 11/27/2021   ? ?   Reactions  ? Cephalosporins Itching  ? Other reaction(s): Other (See Comments) ?Unknown ?Tolerated cefdinir in 2019  ? Crestor [rosuvastatin] Other (See Comments)  ? Lost all muscle mobility   ? Doxycycline Diarrhea, Nausea And Vomiting  ? Other reaction(s): Other (See Comments)  ? Formoterol Other (See Comments)  ? Reaction not recalled  ? Other Anaphylaxis, Itching, Rash, Other (See Comments)  ? Corn fillers, corn by-products - causes severe itching ?Polyester-"pins sticking in her skin  ? Sulfa Antibiotics Anaphylaxis  ? Sulfonamide Derivatives Anaphylaxis  ? Ciprofloxacin Rash, Other (See Comments)  ? Nitrofurantoin Diarrhea, Nausea And Vomiting, Other (See Comments)  ? Also, "convulsions/constant shaking"- per patient  ? Penicillins Rash  ? Underarms (both) Has patient had a PCN reaction causing immediate rash, facial/tongue/throat swelling, SOB or lightheadedness with hypotension: YES ?Has patient had a PCN reaction causing severe rash involving mucus membranes or skin necrosis: NO ?Has patient had a PCN reaction that required hospitalization NO ?Has patient had a PCN reaction occurring within the last 10 years: NO ?If all of the above answers are "NO", then may proceed with Cephalosporin use. ?Other reaction(s): Other (See Comments) ?Underarms (both) ?Other Reaction: "red hot skin" ?Underarms (both) Has patient had a PCN reaction causing immediate rash, facial/tongue/throat swelling, SOB or lightheadedness with hypotension: YES ?Has patient had a PCN reaction causing severe rash involving mucus membranes or skin necrosis: NO ?Has patient had a PCN reaction that required hospitalization NO ?Has patient had a PCN reaction occurring within the last 10 years: NO ?If all of the above answers are "NO", then may proceed with Cephalosporin use.  ? Prednisone Itching  ? Pt states she cannot take prednisone with corn filler 05/19/2019.  ? Amlodipine   ? Swelling in  legs  ? Brilinta [ticagrelor]   ? headache  ? Carvedilol   ? dry mouth  ? Cephalexin Other (See Comments)  ? Reaction not recalled by the patient  ? Dilt-xr Deniece Ree Hcl]   ? LE edema  ? Formoterol Fumarate Other (See Comments)  ? Reaction not recalled  ? Fosfomycin   ? Nerve pain in her legs  ? Gold Sodium Thiosulfate Other (See Comments)  ? Positive patch test  ? Gold-containing Drug Products Other (See Comments)  ? Skin tingles  ? Hydralazine   ? Levofloxacin In D5w Other (See Comments)  ? Other Reaction: racing heart ?Other reaction(s): Other (See Comments) ?Other Reaction: racing heart  ? Macrodantin [nitrofurantoin Macrocrystal] Itching  ? Moxifloxacin Other (See Comments)  ? Caused hands to "shake"  ? Neomycin  Other (See Comments)  ? Doesn't remember - allergist said not to use it because it could add more allergies- Positive patch test  ? Ofloxacin Itching  ? Praluent [alirocumab]   ? shaking  ? Pregabalin Other (See Comments)  ? Ranexa [ranolazine Er]   ? itching  ? Repatha [evolocumab]   ? shaking  ? Valsartan Other (See Comments)  ? Pt reports this med causes her to feel sluggish and she feels heaviness on her shoulders.  ? Welchol [colesevelam]   ? Zetia [ezetimibe]   ? Zolpidem Tartrate Other (See Comments)  ? Acetaminophen Rash, Other (See Comments)  ? Facial rash  ? Fexofenadine Palpitations, Other (See Comments)  ? Heart races  ? Ibuprofen Rash  ? Latex Rash, Other (See Comments)  ? Skin gets red  ? Levofloxacin Palpitations, Other (See Comments)  ? HEART RACING  ? Mold Extract [trichophyton Mentagrophyte] Other (See Comments)  ? Bumps on back, stops up sinuses.  ? Molds & Smuts Rash, Other (See Comments)  ? Bumps on back, stops up sinuses.  ? Nickel Rash  ? Tape Rash  ? Trichophyton Rash, Other (See Comments)  ? Bumps on back, stops up sinuses  ? ?  ? ?  ?Medication List  ?  ? ?TAKE these medications   ? ?Brilinta 90 MG Tabs tablet ?Generic drug: ticagrelor ?Take 1 tablet (90 mg total) by mouth 2  (two) times daily. ?  ?carvedilol 3.125 MG tablet ?Commonly known as: COREG ?Take 1 tablet (3.125 mg total) by mouth 2 (two) times daily. ?  ?cyanocobalamin 1000 MCG/ML injection ?Commonly known as: Comptroller

## 2021-12-04 ENCOUNTER — Ambulatory Visit: Payer: Medicare Other | Admitting: Physical Therapy

## 2021-12-06 ENCOUNTER — Ambulatory Visit: Payer: Medicare Other | Admitting: Physical Therapy

## 2021-12-10 ENCOUNTER — Telehealth (HOSPITAL_BASED_OUTPATIENT_CLINIC_OR_DEPARTMENT_OTHER): Payer: Self-pay

## 2021-12-10 ENCOUNTER — Other Ambulatory Visit (HOSPITAL_COMMUNITY): Payer: Self-pay
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
## 2021-12-10 NOTE — Telephone Encounter (Signed)
Pharmacy Transitions of Care Follow-up Telephone Call ? ?Date of discharge: 11/25/2021  ?Discharge Diagnosis: Right groin exploration, primary repair of right common femoral arteriotomy ? ?How have you been since you were released from the hospital? Pt reports doing well. Saw PCP day following procedure and they changed bandages, pt has been changing bandages since with no issue, has appt tomorrow to have staples removed. Pt reports bruising but no other issues with bleeding.   ? ?Medication changes made at discharge: ? - START: brilinta restarted ? ? ?Medication changes verified by the patient? Yes ?  ? ?Medication Accessibility: ? ?Home Pharmacy: [facility]  ? ?Was the patient provided with refills on discharged medications? Yes  ? ?Have all prescriptions been transferred from [facility] to home pharmacy? Yes.  ? ?Garrel Ridgel, PharmD Candidate ?[facility] ? ?

## 2021-12-11 ENCOUNTER — Encounter: Payer: Self-pay | Admitting: Cardiology

## 2021-12-11 ENCOUNTER — Ambulatory Visit (INDEPENDENT_AMBULATORY_CARE_PROVIDER_SITE_OTHER): Payer: Medicare Other | Admitting: Cardiology

## 2021-12-11 ENCOUNTER — Encounter: Payer: Self-pay | Admitting: Vascular Surgery

## 2021-12-11 ENCOUNTER — Ambulatory Visit (INDEPENDENT_AMBULATORY_CARE_PROVIDER_SITE_OTHER): Payer: Medicare Other | Admitting: Vascular Surgery

## 2021-12-11 VITALS — BP 132/80 | HR 64 | Ht 66.0 in | Wt 189.0 lb

## 2021-12-11 VITALS — BP 176/83 | HR 88 | Temp 98.5°F | Resp 20 | Ht 66.0 in | Wt 189.0 lb

## 2021-12-11 DIAGNOSIS — I5022 Chronic systolic (congestive) heart failure: Secondary | ICD-10-CM

## 2021-12-11 DIAGNOSIS — I998 Other disorder of circulatory system: Secondary | ICD-10-CM

## 2021-12-11 DIAGNOSIS — E785 Hyperlipidemia, unspecified: Secondary | ICD-10-CM

## 2021-12-11 DIAGNOSIS — I25118 Atherosclerotic heart disease of native coronary artery with other forms of angina pectoris: Secondary | ICD-10-CM

## 2021-12-11 DIAGNOSIS — I10 Essential (primary) hypertension: Secondary | ICD-10-CM | POA: Diagnosis not present

## 2021-12-11 DIAGNOSIS — I739 Peripheral vascular disease, unspecified: Secondary | ICD-10-CM
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
## 2021-12-11 NOTE — Patient Instructions (Signed)
Medication Instructions:   Your physician recommends that you continue on your current medications as directed. Please refer to the Current Medication list given to you today.  *If you need a refill on your cardiac medications before your next appointment, please call your pharmacy*   Follow-Up: At [facility], you and your health needs are our priority.  As part of our continuing mission to provide you with exceptional heart care, we have created designated Provider Care Teams.  These Care Teams include your primary Cardiologist (physician) and Advanced Practice Providers (APPs -  Physician Assistants and Nurse Practitioners) who all work together to provide you with the care you need, when you need it.  We recommend signing up for the patient portal called "MyChart".  Sign up information is provided on this After Visit Summary.  MyChart is used to connect with patients for Virtual Visits (Telemedicine).  Patients are able to view lab/test results, encounter notes, upcoming appointments, etc.  Non-urgent messages can be sent to your provider as well.   To learn more about what you can do with MyChart, go to [email or web address].    Your next appointment:   6 month(s)  The format for your next appointment:   In Person  Provider:   Heather E Pemberton, MD {   Important Information About Sugar       

## 2021-12-11 NOTE — Progress Notes (Signed)
?  ? ?  Subjective:  ?  ? Patient ID: Donna Bailey, female   DOB: Jun 01, 1946, 76 y.o.   MRN: [id number] ? ?HPI 76 year old female status post left subclavian artery stenting with need for right common femoral artery access site repair and hematoma evacuation.  She now presents for wound check.  States that the hematoma has settled down in her ankle.  Left hand feels very good she does have persistent ring finger discoloration but she is able to this her hand feels much better than before. ? ? ?Review of Systems ?Right leg swelling and discoloration of the ankle ?   ?Objective:  ? Physical Exam ?Vitals:  ? 12/11/21 1512  ?BP: (!) 176/83  ?Pulse: 88  ?Resp: 20  ?Temp: 98.5 ?F (36.9 ?C)  ?SpO2: 98%  ? ?Awake alert oriented ?Nonlabored respirations ?Right groin healing well with staples intact ?Palpable left radial and ulnar pulses ?Right ankle with subtle hematoma ?   ?Assessment/plan:  ?   ?76 year old female status post left subclavian artery stenting for what appeared to be embolic disease this was possibly on the level of thoracic outlet compression but stented open well and the concern was really to prevent further embolism.  She had unfortunate bleeding from her right groin required hematoma evacuation hematoma is now settled in her right ankle and the wound is healing well.  We will plan to have her back as regularly scheduled on May 31 to remove staples and obtain left upper extremity duplex for stent evaluation. ? ?   ?Tenaya Hilyer C. Randie Heinz, MD ?[facility] ?Office: [phone] ?Pager: [phone] ? ?

## 2021-12-13 ENCOUNTER — Ambulatory Visit: Payer: Medicare Other | Admitting: Physical Therapy

## 2021-12-18 ENCOUNTER — Ambulatory Visit: Payer: Medicare Other | Admitting: Physical Therapy

## 2021-12-19 ENCOUNTER — Ambulatory Visit: Payer: Medicare Other | Admitting: Physical Therapy

## 2021-12-24 ENCOUNTER — Other Ambulatory Visit: Payer: Self-pay

## 2021-12-24 DIAGNOSIS — I771 Stricture of artery: Secondary | ICD-10-CM

## 2022-01-01 ENCOUNTER — Ambulatory Visit (INDEPENDENT_AMBULATORY_CARE_PROVIDER_SITE_OTHER): Payer: Medicare Other | Admitting: Physician Assistant

## 2022-01-01 ENCOUNTER — Ambulatory Visit (HOSPITAL_COMMUNITY)
Admission: RE | Admit: 2022-01-01 | Discharge: 2022-01-01 | Disposition: A | Discharge status: Home / Self Care | Payer: Medicare Other | Source: Ambulatory Visit | Attending: Vascular Surgery | Admitting: Vascular Surgery

## 2022-01-01 VITALS — BP 154/85 | HR 68 | Temp 98.0°F | Resp 20 | Ht 69.0 in

## 2022-01-01 DIAGNOSIS — I771 Stricture of artery: Secondary | ICD-10-CM

## 2022-01-01 NOTE — Progress Notes (Signed)
POST OPERATIVE OFFICE NOTE    CC:  F/u for surgery  HPI:  This is a 76 y.o. 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  She is here for follow up visit s/p Stent of left subclavian artery with 7 x 29 mm VBX.  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  She is pleased with the surgical outcome and her fingers have improved in color and function.  She denies right groin issues as well.  Her primary problem is her Back injury that is limiting her mobility.  She denies weakness, loss of sensation or open wounds on her left UE.        Allergies  Allergen Reactions   Cephalosporins Itching    Other reaction(s): Other (See Comments) Unknown Tolerated cefdinir in 2019   Crestor [Rosuvastatin] Other (See Comments)    Lost all muscle mobility    Doxycycline Diarrhea and Nausea And Vomiting    Other reaction(s): Other (See Comments)   Formoterol Other (See Comments)    Reaction not recalled   Other Anaphylaxis, Itching, Rash and Other (See Comments)    Corn fillers, corn by-products - causes severe itching Polyester-"pins sticking in her skin    Sulfa Antibiotics Anaphylaxis   Sulfonamide Derivatives Anaphylaxis   Ciprofloxacin Rash and Other (See Comments)   Nitrofurantoin Diarrhea, Nausea And Vomiting and Other (See Comments)    Also, "convulsions/constant shaking"- per patient   Penicillins Rash    Underarms (both) Has patient had a PCN reaction causing immediate rash, facial/tongue/throat swelling, SOB or lightheadedness with hypotension: YES Has patient had a PCN reaction causing severe rash involving mucus membranes or skin necrosis: NO Has patient had a PCN reaction that required hospitalization NO Has patient had a PCN reaction occurring within the last 10 years: NO If all of the above answers are  "NO", then may proceed with Cephalosporin use. Other reaction(s): Other (See Comments) Underarms (both) Other Reaction: "red hot skin" Underarms (both) Has patient had a PCN reaction causing immediate rash, facial/tongue/throat swelling, SOB or lightheadedness with hypotension: YES Has patient had a PCN reaction causing severe rash involving mucus membranes or skin necrosis: NO Has patient had a PCN reaction that required hospitalization NO Has patient had a PCN reaction occurring within the last 10 years: NO If all of the above answers are "NO", then may proceed with Cephalosporin use.   Prednisone Itching    Pt states she cannot take prednisone with corn filler 05/19/2019.   Amlodipine     Swelling in legs   Carvedilol     dry mouth   Cephalexin Other (See Comments)    Reaction not recalled by the patient   Dilt-Xr [Diltiazem Hcl]     LE edema   Formoterol Fumarate Other (See Comments)    Reaction not recalled   Fosfomycin     Nerve pain in her legs   Gold Sodium Thiosulfate Other (See Comments)    Positive patch test   Gold-Containing Drug Products Other (See Comments)    Skin tingles   Hydralazine    Levofloxacin In D5w Other (See Comments)    Other Reaction: racing heart Other reaction(s): Other (See Comments) Other Reaction: racing heart   Macrodantin [Nitrofurantoin Macrocrystal] Itching   Moxifloxacin Other (See Comments)    Caused hands to "shake"   Neomycin Other (See Comments)  Doesn't remember - allergist said not to use it because it could add more allergies- Positive patch test    Ofloxacin Itching   Praluent [Alirocumab]     shaking   Pregabalin Other (See Comments)   Ranexa [Ranolazine Er]     itching   Repatha [Evolocumab]     shaking   Valsartan Other (See Comments)    Pt reports this med causes her to feel sluggish and she feels heaviness on her shoulders.   Welchol [Colesevelam]    Zetia [Ezetimibe]    Zolpidem Tartrate Other (See Comments)    Acetaminophen Rash and Other (See Comments)    Facial rash   Fexofenadine Palpitations and Other (See Comments)    Heart races   Ibuprofen Rash   Latex Rash and Other (See Comments)    Skin gets red    Levofloxacin Palpitations and Other (See Comments)    HEART RACING   Mold Extract [Trichophyton Mentagrophyte] Other (See Comments)    Bumps on back, stops up sinuses.   Molds & Smuts Rash and Other (See Comments)    Bumps on back, stops up sinuses.   Nickel Rash   Tape Rash   Trichophyton Rash and Other (See Comments)    Bumps on back, stops up sinuses    Current Outpatient Medications  Medication Sig Dispense Refill   carvedilol (COREG) 3.125 MG tablet Take 1 tablet (3.125 mg total) by mouth 2 (two) times daily. 60 tablet 11   cyanocobalamin (,VITAMIN B-12,) 1000 MCG/ML injection 1,000 mcg every 30 (thirty) days.     Digestive Enzyme CAPS Take 2 capsules by mouth daily as needed (with large meals).     diphenhydrAMINE (BENADRYL) 25 mg capsule Take 25 mg by mouth daily as needed for sleep or itching.     esomeprazole (NEXIUM) 40 MG capsule TAKE 1 CAPSULE BY MOUTH TWICE A DAY 180 capsule 0   famotidine (PEPCID) 40 MG tablet Take 40 mg by mouth at bedtime.     furosemide (LASIX) 40 MG tablet Take 1.5 tablets (60 mg total) by mouth daily. 135 tablet 3   Gabapentin 10 % CREA Apply 1-2 application. topically at bedtime. Feet     guaiFENesin-codeine 100-10 MG/5ML syrup Take 2.5-5 mLs by mouth daily as needed (Cold).     hydrALAZINE (APRESOLINE) 25 MG tablet Take 1 tablet (25 mg total) by mouth daily as needed (for BP greater than 195 systolic). 30 tablet 3   lidocaine (LIDODERM) 5 % Place 0.5 patches onto the skin daily as needed (pain).     MAGNESIUM PO Take 300 mg by mouth every evening.     Multiple Vitamins-Minerals (MULTIVITAMIN WITH MINERALS) tablet Take 1 tablet by mouth daily.     NITRO-BID 2 % ointment Apply 1 g topically daily. Finger tips     nitroGLYCERIN (NITROSTAT) 0.4 MG  SL tablet Place 1 tablet (0.4 mg total) under the tongue every 5 (five) minutes as needed for chest pain. 25 tablet 2   ondansetron (ZOFRAN) 4 MG tablet Take 1 tablet (4 mg total) by mouth every 8 (eight) hours as needed for nausea or vomiting. 60 tablet 1   oxyCODONE (OXY IR/ROXICODONE) 5 MG immediate release tablet Take 1-2 tablets (5-10 mg total) by mouth every 6 (six) hours as needed for moderate pain or severe pain. 30 tablet 0   Probiotic Product (PROBIOTIC DAILY PO) Take 1 tablet by mouth daily.     promethazine-codeine (PHENERGAN WITH CODEINE) 6.25-10 MG/5ML syrup Take 5 mLs  by mouth every 6 (six) hours as needed for cough. 200 mL 0   ticagrelor (BRILINTA) 90 MG TABS tablet Take 1 tablet (90 mg total) by mouth 2 (two) times daily. 60 tablet 3   zolpidem (AMBIEN) 10 MG tablet Take 10 mg by mouth at bedtime as needed for sleep.     No current facility-administered medications for this visit.     ROS:  See HPI  Physical Exam:     Incision:  right groin incision is fully healed staples were removed today Extremities:  palpable radial B equal Neuro: sensation intact slightly;y decreased left fingers compared  lungs:  non labored breathing   Left Doppler Findings:  +---------------+----------+----------+--------+--------+  Site           PSV (cm/s)Waveform  StenosisComments  +---------------+----------+----------+--------+--------+  Subclavian Dist94        biphasic                    +---------------+----------+----------+--------+--------+  Axillary       45        monophasic                  +---------------+----------+----------+--------+--------+  Brachial Prox  100       biphasic                    +---------------+----------+----------+--------+--------+  Brachial Mid   122       biphasic                    +---------------+----------+----------+--------+--------+  Brachial Dist  71        biphasic                     +---------------+----------+----------+--------+--------+  Radial Prox    67        biphasic                    +---------------+----------+----------+--------+--------+  Radial Mid     53        biphasic                    +---------------+----------+----------+--------+--------+  Radial Dist    42        biphasic                    +---------------+----------+----------+--------+--------+  Ulnar Prox     33        biphasic                    +---------------+----------+----------+--------+--------+  Ulnar Mid      31        biphasic                    +---------------+----------+----------+--------+--------+  Ulnar Dist     34        biphasic                    +---------------+----------+----------+--------+--------+          Left Stent(s):  +---------------+---++--------++  Prox to Stent  104biphasic  +---------------+---++--------++  Proximal Stent 108biphasic  +---------------+---++--------++  Mid Stent      73 biphasic  +---------------+---++--------++  Distal Stent   89 biphasic  +---------------+---++--------++  Distal to Stent69 biphasic  +---------------+---++--------++      Summary:      Patent left subclavian stent with no visualized stenosis.  *See table(s) above for measurements and observations.  Assessment/Plan:  This is a 76 y.o. female who is s/p:s/p Stent of left subclavian artery with 7 x 29 mm VBX.  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  The duplex demonstrates patent stent.  She has improved flow to her left hand with improved color to her fingers.  She will f/u in 1 year for repeat duplex.  She may start her pool therapy in 1 week for her spinal fracture.       Roxy Horseman PA-C [facility] [phone]   Clinic  MD:  Donzetta Matters

## 2022-01-02 ENCOUNTER — Other Ambulatory Visit (HOSPITAL_COMMUNITY): Payer: Self-pay

## 2022-01-03 ENCOUNTER — Other Ambulatory Visit (HOSPITAL_COMMUNITY): Payer: Self-pay

## 2022-01-05 ENCOUNTER — Other Ambulatory Visit: Payer: Self-pay | Admitting: Gastroenterology

## 2022-01-15 ENCOUNTER — Telehealth: Payer: Self-pay | Admitting: Internal Medicine

## 2022-01-15 ENCOUNTER — Encounter: Payer: Self-pay | Admitting: Physician Assistant

## 2022-01-15 ENCOUNTER — Ambulatory Visit (INDEPENDENT_AMBULATORY_CARE_PROVIDER_SITE_OTHER): Payer: Medicare Other | Admitting: Physician Assistant

## 2022-01-15 VITALS — BP 163/85 | HR 76 | Ht 66.0 in | Wt 176.2 lb

## 2022-01-15 DIAGNOSIS — I771 Stricture of artery: Secondary | ICD-10-CM | POA: Diagnosis not present

## 2022-01-15 DIAGNOSIS — I2510 Atherosclerotic heart disease of native coronary artery without angina pectoris: Secondary | ICD-10-CM | POA: Diagnosis not present

## 2022-01-15 DIAGNOSIS — I255 Ischemic cardiomyopathy: Secondary | ICD-10-CM

## 2022-01-15 DIAGNOSIS — I10 Essential (primary) hypertension: Secondary | ICD-10-CM | POA: Diagnosis not present

## 2022-01-15 DIAGNOSIS — I739 Peripheral vascular disease, unspecified: Secondary | ICD-10-CM

## 2022-01-15 DIAGNOSIS — E785 Hyperlipidemia, unspecified: Secondary | ICD-10-CM

## 2022-01-15 DIAGNOSIS — I5022 Chronic systolic (congestive) heart failure: Secondary | ICD-10-CM

## 2022-01-15 DIAGNOSIS — I998 Other disorder of circulatory system: Secondary | ICD-10-CM

## 2022-01-15 NOTE — Patient Instructions (Addendum)
Medication Instructions:  NO CHANGES  *If you need a refill on your cardiac medications before your next appointment, please call your pharmacy*   Lab Work: NONE If you have labs (blood work) drawn today and your tests are completely normal, you will receive your results only by: [place] (if you have MyChart) OR A paper copy in the mail If you have any lab test that is abnormal or we need to change your treatment, we will call you to review the results.   Testing/Procedures: Cardiac PET at [facility] will be called to schedule once approved with insurance   Follow-Up: At [facility], you and your health needs are our priority.  As part of our continuing mission to provide you with exceptional heart care, we have created designated Provider Care Teams.  These Care Teams include your primary Cardiologist (physician) and Advanced Practice Providers (APPs -  Physician Assistants and Nurse Practitioners) who all work together to provide you with the care you need, when you need it.  We recommend signing up for the patient portal called "MyChart".  Sign up information is provided on this After Visit Summary.  MyChart is used to connect with patients for Virtual Visits (Telemedicine).  Patients are able to view lab/test results, encounter notes, upcoming appointments, etc.  Non-urgent messages can be sent to your provider as well.   To learn more about what you can do with MyChart, go to [email or web address].    Your next appointment:   2-3 month(s)  The format for your next appointment:   In Person  Provider:   Freada Bergeron, MD {   Other Instructions Schedule appointment with [facility] clinical pharmacist for [facility] education/injection      Cardiac PET Instructions:   How to Prepare for Your Cardiac PET/CT Stress Test:  1. Please do not take these medications before your test:   Medications that may interfere with the cardiac pharmacological stress  agent (ex. nitrates - including erectile dysfunction medications or beta-blockers) the day of the exam. (Erectile dysfunction medication should be held for at least 72 hrs prior to test) Theophylline containing medications for 12 hours. Dipyridamole 48 hours prior to the test. Your remaining medications may be taken with water.  2. Nothing to eat or drink, except water, 3 hours prior to arrival time.   NO caffeine/decaffeinated products, or chocolate 12 hours prior to arrival.  3. NO perfume, cologne or lotion  4. Total time is 1 to 2 hours; you may want to bring reading material for the waiting time.  5. Please report to Admitting at the [facility] Main Entrance 60 minutes early for your test.  [place], [place] [id number]  Diabetic Preparation:  Hold oral medications. You may take NPH and Lantus insulin. Do not take Humalog or Humulin R (Regular Insulin) the day of your test. Check blood sugars prior to leaving the house. If able to eat breakfast prior to 3 hour fasting, you may take all medications, including your insulin, Do not worry if you miss your breakfast dose of insulin - start at your next meal.  IF YOU THINK YOU MAY BE PREGNANT, OR ARE NURSING PLEASE INFORM THE TECHNOLOGIST.  In preparation for your appointment, medication and supplies will be purchased.  Appointment availability is limited, so if you need to cancel or reschedule, please call the Radiology Department at [phone]  24 hours in advance to avoid a cancellation fee of $100.00  What to Expect  After you Arrive:  Once you arrive and check in for your appointment, you will be taken to a preparation room within the Radiology Department.  A technologist or Nurse will obtain your medical history, verify that you are correctly prepped for the exam, and explain the procedure.  Afterwards,  an IV will be started in your arm and electrodes will be placed on your skin for EKG monitoring during  the stress portion of the exam. Then you will be escorted to the PET/CT scanner.  There, staff will get you positioned on the scanner and obtain a blood pressure and EKG.  During the exam, you will continue to be connected to the EKG and blood pressure machines.  A small, safe amount of a radioactive tracer will be injected in your IV to obtain a series of pictures of your heart along with an injection of a stress agent.    After your Exam:  It is recommended that you eat a meal and drink a caffeinated beverage to counter act any effects of the stress agent.  Drink plenty of fluids for the remainder of the day and urinate frequently for the first couple of hours after the exam.  Your doctor will inform you of your test results within 7-10 business days.  For questions about your test or how to prepare for your test, please call: Marchia Bond, Cardiac Imaging Nurse Navigator  Gordy Clement, Cardiac Imaging Nurse Navigator Office: [phone]

## 2022-01-15 NOTE — Progress Notes (Addendum)
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 4:43 PM    [place]

## 2022-01-15 NOTE — Telephone Encounter (Signed)
Patient seen in office 01/15/22 by Janace Hoard PA. This Probation officer discussed Lawyer with patient, as she had been identified as possible candidate for medication, free-trial-offer sample obtained and onsite at [place] office, and patient aware of this. She stated since she has other health conditions at present, concerns about side effects, she wishes to hold off on med at this time. Advised that she can contact office should she change her mind in the future.

## 2022-01-17 ENCOUNTER — Telehealth: Payer: Self-pay | Admitting: Physician Assistant

## 2022-01-17 NOTE — Telephone Encounter (Signed)
Patient called and mentioned that the pain under her right rib has gotten worse since appt on 6/14. Patient wants to know if she is doing the right thing or not. Please call to discuss

## 2022-01-17 NOTE — Telephone Encounter (Signed)
Patient of Dr. Johney Frame  Routed to [facility] Triage

## 2022-01-17 NOTE — Telephone Encounter (Signed)
I spoke with patient.  She reports she just woke up from a nap and is groggy.  Patient initially said she is having worsening of her pancreatitis pain. Asking if she should continue a liquid diet. I advised patient to call her GI doctor for recommendations regarding this.   I asked patient about changes since office visit earlier this week.  She thinks her pain has worsened. Patient then reported she is having pain like she had with her MI.  I advised her if she is having this type of pain she should call 911. Patient then starting talking about gall stones.  I told her this would be a question for her GI doctor and advised her again if she was having pain similar to MI she should call 911.

## 2022-01-20 ENCOUNTER — Other Ambulatory Visit: Payer: Self-pay

## 2022-01-20 ENCOUNTER — Telehealth: Payer: Self-pay | Admitting: Nurse Practitioner

## 2022-01-20 DIAGNOSIS — R109 Unspecified abdominal pain: Secondary | ICD-10-CM

## 2022-01-20 DIAGNOSIS — R110 Nausea: Secondary | ICD-10-CM

## 2022-01-20 MED ORDER — ONDANSETRON 4 MG PO TBDP: 4.0000 mg | ORAL_TABLET | Freq: Four times a day (QID) | ORAL | 0 refills | Status: DC | PRN

## 2022-01-20 NOTE — Telephone Encounter (Signed)
Pt was made aware of [name] NP recommendations:  Orders for labs placed in Epic: Pt made aware. Location to lab given: Pt stated that she will come by our lab tomorrow AM: Prescription sent to pharmacy for Zofran: Pt made aware Apps schedules reviewed. Pt was scheduled to see Tye Savoy NP on 01/22/2022 at 2:00: Pt made aware Pt notified if  she is having severe abdominal pain she needs to go to the ED for labs and CTAP. Pt verbalized understanding with all questions answered.

## 2022-01-20 NOTE — Telephone Encounter (Signed)
Donna Bailey, please contact the patient and send her to the lab for a CBC, CMP and lipase level.  Send in Rx for ondansetron 4 mg ODT 1 tab dissolve on tongue every 6 to 8 hours as needed dispense #30, no refills.  Push fluids, bland diet. IF she is having severe abdominal pain she needs to go to the ED for labs and CTAP.  Please check the schedule this week to see if there have been any cancellations and possibly see if the Alonza Bogus PA-C has any earlier appointments as she has seen the patient in the past as well.  Let me know outcome. Thx

## 2022-01-20 NOTE — Telephone Encounter (Signed)
Pt states that she has been having abdominal pain along with nausea for  about two weeks. Pt states that she has a history of pancreatitis and this feels similar:Pt stated that she went to her Cardiologist 01/17/2022 last week to ensure that it was not anything cardiac related. Pt stated that the Cardiologist recommended her to reach out to her GI. Dr. Abbott Pao stated that she has mostly been on a clear liquid diet the last week along with Toast. Please advise

## 2022-01-20 NOTE — Telephone Encounter (Signed)
Inbound call from patient stating she is having a pancreatitis flare up and needed to be seen. I advised patient that Dr. Blanch Media next opening was July 19 and Colleen 7/24. Patient stated " I will be dead by then" and is requesting a call back to discuss with the nurse. Please advise.

## 2022-01-21 ENCOUNTER — Other Ambulatory Visit (INDEPENDENT_AMBULATORY_CARE_PROVIDER_SITE_OTHER): Payer: Medicare Other

## 2022-01-21 DIAGNOSIS — R109 Unspecified abdominal pain: Secondary | ICD-10-CM

## 2022-01-21 DIAGNOSIS — R110 Nausea: Secondary | ICD-10-CM | POA: Diagnosis not present

## 2022-01-21 LAB — CBC WITH DIFFERENTIAL/PLATELET
Basophils Absolute: 0.1 10*3/uL (ref 0.0–0.1)
Basophils Relative: 0.6 % (ref 0.0–3.0)
Eosinophils Absolute: 0 10*3/uL (ref 0.0–0.7)
Eosinophils Relative: 0.2 % (ref 0.0–5.0)
HCT: 46 % (ref 36.0–46.0)
Hemoglobin: 15.2 g/dL — ABNORMAL HIGH (ref 12.0–15.0)
Lymphocytes Relative: 4.7 % — ABNORMAL LOW (ref 12.0–46.0)
Lymphs Abs: 0.5 10*3/uL — ABNORMAL LOW (ref 0.7–4.0)
MCHC: 33.1 g/dL (ref 30.0–36.0)
MCV: 94.4 fl (ref 78.0–100.0)
Monocytes Absolute: 0.3 10*3/uL (ref 0.1–1.0)
Monocytes Relative: 2.5 % — ABNORMAL LOW (ref 3.0–12.0)
Neutro Abs: 9.6 10*3/uL — ABNORMAL HIGH (ref 1.4–7.7)
Neutrophils Relative %: 92 % — ABNORMAL HIGH (ref 43.0–77.0)
Platelets: 306 10*3/uL (ref 150.0–400.0)
RBC: 4.88 Mil/uL (ref 3.87–5.11)
RDW: 17.3 % — ABNORMAL HIGH (ref 11.5–15.5)
WBC: 10.4 10*3/uL (ref 4.0–10.5)

## 2022-01-21 LAB — COMPREHENSIVE METABOLIC PANEL
ALT: 19 U/L (ref 0–35)
AST: 17 U/L (ref 0–37)
Albumin: 3.6 g/dL (ref 3.5–5.2)
Alkaline Phosphatase: 128 U/L — ABNORMAL HIGH (ref 39–117)
BUN: 12 mg/dL (ref 6–23)
CO2: 24 mEq/L (ref 19–32)
Calcium: 9.3 mg/dL (ref 8.4–10.5)
Chloride: 100 mEq/L (ref 96–112)
Creatinine, Ser: 1.03 mg/dL (ref 0.40–1.20)
GFR: 53.34 mL/min — ABNORMAL LOW (ref >60.00)
Glucose, Bld: 163 mg/dL — ABNORMAL HIGH (ref 70–99)
Potassium: 4.7 mEq/L (ref 3.5–5.1)
Sodium: 134 mEq/L — ABNORMAL LOW (ref 135–145)
Total Bilirubin: 0.5 mg/dL (ref 0.2–1.2)
Total Protein: 6.8 g/dL (ref 6.0–8.3)

## 2022-01-21 LAB — LIPASE: Lipase: 8 U/L — ABNORMAL LOW (ref 11.0–59.0)

## 2022-01-22 ENCOUNTER — Encounter: Payer: Self-pay | Admitting: Nurse Practitioner

## 2022-01-22 ENCOUNTER — Ambulatory Visit (INDEPENDENT_AMBULATORY_CARE_PROVIDER_SITE_OTHER): Payer: Medicare Other | Admitting: Nurse Practitioner

## 2022-01-22 VITALS — BP 130/90 | HR 84 | Ht 63.5 in | Wt 173.2 lb

## 2022-01-22 DIAGNOSIS — R110 Nausea: Secondary | ICD-10-CM | POA: Diagnosis not present

## 2022-01-22 DIAGNOSIS — G8929 Other chronic pain: Secondary | ICD-10-CM | POA: Diagnosis not present

## 2022-01-22 DIAGNOSIS — R634 Abnormal weight loss: Secondary | ICD-10-CM

## 2022-01-22 DIAGNOSIS — R109 Unspecified abdominal pain: Secondary | ICD-10-CM | POA: Diagnosis not present
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
## 2022-01-22 NOTE — Patient Instructions (Addendum)
If you are age 75 or older, your body mass index should be between 23-30. Your Body mass index is 30.21 kg/m. If this is out of the aforementioned range listed, please consider follow up with your Primary Care Provider.  If you are age 24 or younger, your body mass index should be between 19-25. Your Body mass index is 30.21 kg/m. If this is out of the aformentioned range listed, please consider follow up with your Primary Care Provider.   ________________________________________________________  The [place] [facility] providers would like to encourage you to use [facility] to communicate with providers for non-urgent requests or questions.  Due to long hold times on the telephone, sending your provider a message by [facility] may be a faster and more efficient way to get a response.  Please allow 48 business hours for a response.  Please remember that this is for non-urgent requests.  _______________________________________________________   We will contact you after talking to Dr .Henrene Pastor.   It was a pleasure to see you today!  Thank you for trusting me with your gastrointestinal care!

## 2022-01-23 ENCOUNTER — Encounter: Payer: Self-pay | Admitting: Nurse Practitioner

## 2022-02-07 ENCOUNTER — Ambulatory Visit (INDEPENDENT_AMBULATORY_CARE_PROVIDER_SITE_OTHER): Payer: Medicare Other | Admitting: Podiatry

## 2022-02-07 ENCOUNTER — Encounter: Payer: Self-pay | Admitting: Podiatry

## 2022-02-07 DIAGNOSIS — M79674 Pain in right toe(s): Secondary | ICD-10-CM

## 2022-02-07 DIAGNOSIS — M79675 Pain in left toe(s): Secondary | ICD-10-CM | POA: Diagnosis not present

## 2022-02-07 DIAGNOSIS — B351 Tinea unguium: Secondary | ICD-10-CM

## 2022-02-08 NOTE — Progress Notes (Signed)
Subjective:   Patient ID: Donna Bailey, female   DOB: 76 y.o.   MRN: [id number]   HPI Patient presents with caregiver with elongated nailbeds 1-5 both feet that are thick and dystrophic   ROS      Objective:  Physical Exam  Neurovascular status intact with thick yellow brittle nailbeds 1-5 both feet painful     Assessment:  Chronic mycotic nail infection with pain 1-5 both feet     Plan:  Debridement painful nailbeds 1-5 both feet no iatrogenic bleeding reappoint routine care

## 2022-02-10 ENCOUNTER — Telehealth: Payer: Self-pay

## 2022-02-10 NOTE — Telephone Encounter (Signed)
Pt called stating that her leg was swollen and purple below her knee.  Reviewed pt's chart, returned pt's call, two identifiers used. Pt stated that she went to the podiatrist to have her nails clipped on Friday, 7/7 and later that day her legs became read and swollen, R leg being slightly worse. She elevated and wrapped which has since alleviated the swelling. She denies any coldness, any hot areas, or any open wounds. She does have numbness, but chronic neuropathy, so that is not abnormal for her. She was instructed to call her PCP given that this is an ongoing issue noted by this office and she was just seen on 5/31 with [place]. She will see PCP to r/o other possible causes, monitor for worsening changes, and call if needed. Confirmed understanding.

## 2022-02-24 DIAGNOSIS — W19XXXA Unspecified fall, initial encounter: Secondary | ICD-10-CM | POA: Insufficient documentation

## 2022-02-24 DIAGNOSIS — M47816 Spondylosis without myelopathy or radiculopathy, lumbar region: Secondary | ICD-10-CM | POA: Insufficient documentation

## 2022-02-27 ENCOUNTER — Other Ambulatory Visit: Payer: Self-pay

## 2022-02-27 ENCOUNTER — Other Ambulatory Visit (HOSPITAL_COMMUNITY): Payer: Self-pay

## 2022-02-27 DIAGNOSIS — M5459 Other low back pain: Secondary | ICD-10-CM

## 2022-03-03 ENCOUNTER — Other Ambulatory Visit (HOSPITAL_COMMUNITY): Payer: Self-pay

## 2022-03-03 DIAGNOSIS — M5459 Other low back pain: Secondary | ICD-10-CM

## 2022-03-11 ENCOUNTER — Encounter (HOSPITAL_COMMUNITY): Payer: Self-pay

## 2022-03-11 ENCOUNTER — Ambulatory Visit (HOSPITAL_COMMUNITY)
Admission: RE | Admit: 2022-03-11 | Discharge: 2022-03-11 | Disposition: A | Discharge status: Home / Self Care | Payer: Medicare Other | Source: Ambulatory Visit | Attending: Orthopedic Surgery | Admitting: Orthopedic Surgery

## 2022-03-11 ENCOUNTER — Other Ambulatory Visit (HOSPITAL_COMMUNITY): Payer: Medicare Other

## 2022-03-11 DIAGNOSIS — M5459 Other low back pain: Secondary | ICD-10-CM | POA: Insufficient documentation
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
## 2022-03-17 ENCOUNTER — Telehealth: Payer: Self-pay | Admitting: Cardiology

## 2022-03-17 ENCOUNTER — Ambulatory Visit (INDEPENDENT_AMBULATORY_CARE_PROVIDER_SITE_OTHER): Payer: Medicare Other | Admitting: Cardiology

## 2022-03-17 VITALS — BP 170/112 | HR 115 | Ht 63.5 in | Wt 166.0 lb

## 2022-03-17 DIAGNOSIS — I739 Peripheral vascular disease, unspecified: Secondary | ICD-10-CM

## 2022-03-17 DIAGNOSIS — I2510 Atherosclerotic heart disease of native coronary artery without angina pectoris: Secondary | ICD-10-CM

## 2022-03-17 DIAGNOSIS — M791 Myalgia, unspecified site: Secondary | ICD-10-CM

## 2022-03-17 DIAGNOSIS — I10 Essential (primary) hypertension: Secondary | ICD-10-CM

## 2022-03-17 DIAGNOSIS — I25118 Atherosclerotic heart disease of native coronary artery with other forms of angina pectoris: Secondary | ICD-10-CM

## 2022-03-17 DIAGNOSIS — I5022 Chronic systolic (congestive) heart failure: Secondary | ICD-10-CM | POA: Diagnosis not present

## 2022-03-17 DIAGNOSIS — I255 Ischemic cardiomyopathy: Secondary | ICD-10-CM

## 2022-03-17 DIAGNOSIS — E785 Hyperlipidemia, unspecified: Secondary | ICD-10-CM

## 2022-03-17 DIAGNOSIS — I771 Stricture of artery: Secondary | ICD-10-CM

## 2022-03-17 DIAGNOSIS — T466X5A Adverse effect of antihyperlipidemic and antiarteriosclerotic drugs, initial encounter: Secondary | ICD-10-CM

## 2022-03-17 NOTE — Telephone Encounter (Signed)
Pt was seen in office today and would like a call back to discuss a phone call she received once she was home today for the PET CT that was put in from [place], [place] 06/14. Pt would like to know if Dr. Johney Frame believes she still needs this scheduled. Please advise.

## 2022-03-17 NOTE — Patient Instructions (Signed)
Medication Instructions:   Your physician recommends that you continue on your current medications as directed. Please refer to the Current Medication list given to you today.  *If you need a refill on your cardiac medications before your next appointment, please call your pharmacy*   Follow-Up:  [place] PA-C   Important Information About Sugar

## 2022-03-17 NOTE — Telephone Encounter (Signed)
Pt aware that I spoke with Dr. Johney Frame about this, and we will defer this out and if she develops acute symptoms/chest pain, then we can advise on scheduling this appt.   Pt aware that, that way, this can allow her back pain to heal before doing this test.  Pt verbalized understanding and agrees with this plan.

## 2022-03-19 ENCOUNTER — Other Ambulatory Visit (HOSPITAL_COMMUNITY): Payer: Self-pay | Admitting: Interventional Radiology

## 2022-03-19 ENCOUNTER — Telehealth (HOSPITAL_COMMUNITY): Payer: Self-pay

## 2022-03-19 NOTE — Telephone Encounter (Signed)
Ok per Dr. Estanislado Pandy for T12, L1, and L3. He highly recommends a biopsy of T12. Will send a request to Dr. Nelva Bush for biopsy.   Called pt to give her an update, no answer, left vm. AW

## 2022-03-20 ENCOUNTER — Other Ambulatory Visit (HOSPITAL_COMMUNITY): Payer: Self-pay | Admitting: Interventional Radiology

## 2022-03-20 ENCOUNTER — Telehealth (HOSPITAL_COMMUNITY): Payer: Self-pay

## 2022-03-20 DIAGNOSIS — S22080A Wedge compression fracture of T11-T12 vertebra, initial encounter for closed fracture: Secondary | ICD-10-CM

## 2022-03-20 DIAGNOSIS — S32030A Wedge compression fracture of third lumbar vertebra, initial encounter for closed fracture: Secondary | ICD-10-CM

## 2022-03-20 DIAGNOSIS — S32010A Wedge compression fracture of first lumbar vertebra, initial encounter for closed fracture: Secondary | ICD-10-CM

## 2022-03-20 NOTE — Telephone Encounter (Signed)
Called to schedule kyphoplasty, no answer, left vm. AW  

## 2022-03-25 ENCOUNTER — Telehealth (HOSPITAL_COMMUNITY): Payer: Self-pay

## 2022-03-25 ENCOUNTER — Ambulatory Visit (HOSPITAL_COMMUNITY): Payer: Medicare Other

## 2022-03-25 NOTE — Telephone Encounter (Signed)
Pt called to cancel her consultation for possibly kp. She does not want to reschedule at this time. AW

## 2022-03-26 ENCOUNTER — Ambulatory Visit (HOSPITAL_COMMUNITY): Payer: Medicare Other

## 2022-03-26 ENCOUNTER — Encounter (HOSPITAL_COMMUNITY): Payer: Self-pay

## 2022-04-04 ENCOUNTER — Telehealth: Payer: Self-pay | Admitting: *Deleted

## 2022-04-04 NOTE — Telephone Encounter (Signed)
   Pre-operative Risk Assessment    Patient Name: Donna Bailey  DOB: 1946/07/17 MRN: [id number]      Request for Surgical Clearance    Procedure:   KYPHOPLASTY  Date of Surgery:  Clearance TBD                                 Surgeon:  DR. ALEX POWERS Surgeon's Group or Practice Name:  [facility] Phone number:  [phone] Fax number:  [phone]   Type of Clearance Requested:   - Medical  - Pharmacy:  Hold Ticagrelor (Brilinta)     Type of Anesthesia:  Not Indicated   Additional requests/questions:    [name]   04/04/2022, 2:59 PM

## 2022-04-04 NOTE — Telephone Encounter (Signed)
Dr. Johney Frame to review.  The PET stress test was previously recommended in June, however never scheduled.  She was previously cleared for kyphoplasty in February 2023.  Holding time of Brilinta was not mentioned at that time.  She has since underwent stenting of the left subclavian artery by Dr. Donzetta Matters on 11/25/2021.  Dr. Johney Frame, you still want the patient to undergo PET stress test at this time?  Does she need PET stress test prior to kyphoplasty clearance?  Note, this procedure was previously cleared in February 2023, however patient has since underwent stenting of left subclavian artery in April.  Can the patient hold Brilinta for 5 to 7 days prior to the procedure and restart it as soon as possible afterward at the surgeon's discretion?

## 2022-04-09 NOTE — Telephone Encounter (Signed)
She does not need the PET from our end unless she is having concerning symptoms given recent cath 05/2021. She will need to check with vascular surgery about holding her brilinta given recent subclavian stent. It is okay to hold from a CV perspective.

## 2022-04-10 NOTE — Telephone Encounter (Signed)
I have notified requesting provider's office that clearance to hold Brilinta will need to come from Dr. Donzetta Matters given that she underwent left subclavian artery stenting in April 2023.   Emmaline Life, NP-C     04/10/2022, 7:55 AM [address] Office [phone] Fax [phone]

## 2022-04-10 NOTE — Telephone Encounter (Signed)
   Primary Cardiologist: Freada Bergeron, MD  Chart reviewed as part of pre-operative protocol coverage. Given past medical history and time since last visit, based on ACC/AHA guidelines, Donna Bailey would be at acceptable risk for the planned procedure without further cardiovascular testing.   Patient was advised that if she develops new symptoms prior to surgery to contact our office to arrange a follow-up appointment.  She verbalized understanding. Reports she is considering cancelling the procedure. Advised her that agreement to hold Brilinta will need to come from Dr. Donzetta Matters. She is cleared to hold from a cardiac perspective.  I will route this recommendation to the requesting party via Epic fax function and remove from pre-op pool.  Please call with questions.  Donna Life, NP-C     04/10/2022, 8:38 AM [address], [address] Office [phone] Fax [phone]

## 2022-04-14 ENCOUNTER — Other Ambulatory Visit (HOSPITAL_COMMUNITY): Payer: Self-pay

## 2022-04-27 NOTE — Progress Notes (Unsigned)
HPI female never smoker followed for bronchitis, allergic rhinitis, chronic insomnia, "intolerance of all corn products", complicated by GERD and multiple medical problems, CAD/MI/ stent Seroquel made her too dizzy. Belsomra 15 mg made her muscles stiff and left her groggy but did help her sleep. Rozerem did not help with sleep but made her dizzy She again requests prescription for zolpidem 10 mg, insisting that nothing else has worked as well to help her manage chronic insomnia. She limits foods with magnesium which she says make her feel hot She tells me she is allergic to nickel, gold and polyester "all of which conduct electricity". Therefore she wants a letter for her to present to [employer] asking them to change her electric meter to a "noncommunicating electric meter", so that [employer] will stop sending radio waves through her home. She prefers a manual meter check.  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 -----Patient states that she needs to follow up about a HST and needs refill on cough syrup.  She chose not to have home sleep test done.  She said the nasal thermistor probe itched and the carrying case had "mold" which she claimed had now "contaminated her space".  She endorses allergies to polyester and corn.  She is particularly concerned about "aflatoxin in corn".  We do not have objective documentation or clear syndrome pattern.  Taking zolpidem 10 mg at at bedtime helps insomnia and well-tolerated. CXR 05/23/21- IMPRESSION: 1. No acute intrathoracic  process.  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 Obesity, Hyperlipidemia, Migraine, Hiatal Hernia, GERD/ Barretts, Vertebral Compression Fractures, -zolpidem,  Covid vax- 3 Phizer   , Flu vax- today standard -----Pt f/u o2 sats have been good, she is feeling unwell due to having a broken back Breathing ok. Sneezes in the morning, blows her nose then ok. We are out of high dose flu vax. She could get it elsewhere but chooses to get standard ax since she is here. Asks cough syrup refill to have available. Says she sustained multiple vertebral fx's during transfer from table to stretcher this Spring. Pending kyphoplasty.  ROS-see HPI   + = positive Constitutional:    weight loss, night sweats, fevers, chills, +fatigue, lassitude. HEENT:    headaches, difficulty swallowing, tooth/dental problems, sore throat,       sneezing, itching, ear ache, nasal congestion, post nasal drip, snoring CV:    chest pain, orthopnea, PND, swelling in lower extremities, anasarca,                           dizziness, palpitations Resp:   shortness of breath with exertion or at rest.                productive cough,   non-productive cough, coughing up of blood.              change in color of mucus.  wheezing.   Skin:    rash or lesions. GI:  No-   heartburn, indigestion, abdominal pain, nausea, vomiting, diarrhea,  change in bowel habits, loss of appetite GU: dysuria, change in color of urine, no urgency or frequency.   flank pain. MS:   joint pain, stiffness, decreased range of motion, +back pain. Neuro-     nothing unusual Psych:  change in mood or affect.  depression or anxiety.   memory loss.  OBJ- Physical Exam General- Alert, Oriented, Affect-appropriate, Distress- none acute, +uncomfortable in exam chair-adjusted as needed to accomodate. Skin- rash-none, lesions- none, excoriation-  none Lymphadenopathy- none Head-             Eyes- Gross vision intact, PERRLA, conjunctivae and secretions clear            Ears- Hearing, canals-normal            Nose- Clear, no-Septal dev, mucus, polyps, erosion, perforation             Throat- Mallampati II , mucosa clear , drainage- none, tonsils- atrophic, + teeth Neck- flexible , trachea midline, no stridor , thyroid nl, carotid no bruit Chest - symmetrical excursion , unlabored           Heart/CV- RRR , no murmur , no gallop  , no rub, nl s1 s2                           - JVD- none , edema- none, stasis changes- none, varices- none           Lung- clear to P&A, wheeze- none, cough- none , dullness-none, rub- none           Chest wall-  Abd-  Br/ Gen/ Rectal- Not done, not indicated Extrem- cyanosis- none, clubbing, none, atrophy- none, strength- nl Neuro- grossly intact to observation

## 2022-04-29 ENCOUNTER — Ambulatory Visit (INDEPENDENT_AMBULATORY_CARE_PROVIDER_SITE_OTHER): Payer: Medicare Other | Admitting: Internal Medicine

## 2022-04-29 ENCOUNTER — Encounter: Payer: Self-pay | Admitting: Internal Medicine

## 2022-04-29 VITALS — BP 102/64 | HR 127 | Ht 65.0 in | Wt 158.0 lb

## 2022-04-29 DIAGNOSIS — J310 Chronic rhinitis: Secondary | ICD-10-CM

## 2022-04-29 DIAGNOSIS — J45909 Unspecified asthma, uncomplicated: Secondary | ICD-10-CM

## 2022-04-29 DIAGNOSIS — Z23 Encounter for immunization: Secondary | ICD-10-CM

## 2022-04-29 MED ORDER — GUAIFENESIN-CODEINE 100-10 MG/5ML PO SOLN: 2.5000 mL | Freq: Every day | ORAL | 0 refills | Status: DC | PRN

## 2022-04-29 NOTE — Patient Instructions (Signed)
Cough syrup refilled at [facility]  I hope your back gets more comfortable.  Please call if we can help

## 2022-04-29 NOTE — Assessment & Plan Note (Signed)
No recent exacerbation. Asks refill cough syrup to hold for this winter, but not using inhalers.

## 2022-04-29 NOTE — Assessment & Plan Note (Signed)
Transient sniffing in the morning but blowing nose is sufficient.

## 2022-05-02 ENCOUNTER — Other Ambulatory Visit: Payer: Self-pay | Admitting: Family Medicine

## 2022-05-02 DIAGNOSIS — S32020S Wedge compression fracture of second lumbar vertebra, sequela: Secondary | ICD-10-CM

## 2022-05-12 ENCOUNTER — Other Ambulatory Visit (HOSPITAL_BASED_OUTPATIENT_CLINIC_OR_DEPARTMENT_OTHER): Payer: Self-pay | Admitting: Family Medicine

## 2022-05-12 ENCOUNTER — Ambulatory Visit (HOSPITAL_BASED_OUTPATIENT_CLINIC_OR_DEPARTMENT_OTHER)
Admission: RE | Admit: 2022-05-12 | Discharge: 2022-05-12 | Disposition: A | Discharge status: Home / Self Care | Payer: Medicare Other | Source: Ambulatory Visit | Attending: Family Medicine | Admitting: Family Medicine

## 2022-05-12 DIAGNOSIS — S32020S Wedge compression fracture of second lumbar vertebra, sequela: Secondary | ICD-10-CM

## 2022-05-13 ENCOUNTER — Ambulatory Visit (HOSPITAL_BASED_OUTPATIENT_CLINIC_OR_DEPARTMENT_OTHER)
Admission: RE | Admit: 2022-05-13 | Discharge: 2022-05-13 | Disposition: A | Discharge status: Home / Self Care | Payer: Medicare Other | Source: Ambulatory Visit | Attending: Family Medicine | Admitting: Family Medicine

## 2022-05-13 DIAGNOSIS — X58XXXS Exposure to other specified factors, sequela: Secondary | ICD-10-CM | POA: Insufficient documentation

## 2022-05-13 DIAGNOSIS — M810 Age-related osteoporosis without current pathological fracture: Secondary | ICD-10-CM | POA: Insufficient documentation

## 2022-05-13 DIAGNOSIS — S32020S Wedge compression fracture of second lumbar vertebra, sequela: Secondary | ICD-10-CM | POA: Diagnosis present

## 2022-05-13 DIAGNOSIS — N189 Chronic kidney disease, unspecified: Secondary | ICD-10-CM | POA: Diagnosis not present

## 2022-05-13 DIAGNOSIS — Z13820 Encounter for screening for osteoporosis: Secondary | ICD-10-CM | POA: Insufficient documentation

## 2022-05-13 DIAGNOSIS — Z780 Asymptomatic menopausal state: Secondary | ICD-10-CM | POA: Insufficient documentation

## 2022-05-16 ENCOUNTER — Ambulatory Visit (INDEPENDENT_AMBULATORY_CARE_PROVIDER_SITE_OTHER): Payer: Medicare Other | Admitting: Podiatry

## 2022-05-16 ENCOUNTER — Encounter: Payer: Self-pay | Admitting: Podiatry

## 2022-05-16 DIAGNOSIS — M79674 Pain in right toe(s): Secondary | ICD-10-CM

## 2022-05-16 DIAGNOSIS — M79675 Pain in left toe(s): Secondary | ICD-10-CM | POA: Diagnosis not present

## 2022-05-16 DIAGNOSIS — B351 Tinea unguium: Secondary | ICD-10-CM | POA: Diagnosis not present

## 2022-05-16 NOTE — Progress Notes (Unsigned)
Subjective:  Patient ID: Donna Bailey, female    DOB: [birth year],  MRN: [id number]  Donna Bailey presents to clinic today for:  Chief Complaint  Patient presents with   Nail Problem    Routine foot care PCP-Alan Ross PCP VST-2 months ago   New problem(s): None.   PCP is [name], MD.  Allergies  Allergen Reactions   Cephalosporins Itching    Other reaction(s): Other (See Comments) Unknown Tolerated cefdinir in 2019   Crestor [Rosuvastatin] Other (See Comments)    Lost all muscle mobility    Doxycycline Diarrhea and Nausea And Vomiting    Other reaction(s): Other (See Comments)   Formoterol Other (See Comments)    Reaction not recalled   Other Anaphylaxis, Itching, Rash and Other (See Comments)    Corn fillers, corn by-products - causes severe itching Polyester-"pins sticking in her skin    Sulfa Antibiotics Anaphylaxis   Sulfonamide Derivatives Anaphylaxis   Ciprofloxacin Rash and Other (See Comments)   Nitrofurantoin Diarrhea, Nausea And Vomiting and Other (See Comments)    Also, "convulsions/constant shaking"- per patient   Penicillins Rash    Underarms (both) Has patient had a PCN reaction causing immediate rash, facial/tongue/throat swelling, SOB or lightheadedness with hypotension: YES Has patient had a PCN reaction causing severe rash involving mucus membranes or skin necrosis: NO Has patient had a PCN reaction that required hospitalization NO Has patient had a PCN reaction occurring within the last 10 years: NO If all of the above answers are "NO", then may proceed with Cephalosporin use. Other reaction(s): Other (See Comments) Underarms (both) Other Reaction: "red hot skin" Underarms (both) Has patient had a PCN reaction causing immediate rash, facial/tongue/throat swelling, SOB or lightheadedness with hypotension: YES Has patient had a PCN reaction causing severe rash involving mucus membranes or skin necrosis: NO Has patient had a PCN  reaction that required hospitalization NO Has patient had a PCN reaction occurring within the last 10 years: NO If all of the above answers are "NO", then may proceed with Cephalosporin use.   Prednisone Itching    Pt states she cannot take prednisone with corn filler 05/19/2019.   Amlodipine     Swelling in legs   Carvedilol     dry mouth   Cephalexin Other (See Comments)    Reaction not recalled by the patient   Dilt-Xr [Diltiazem Hcl]     LE edema   Esomeprazole     Other reaction(s): Unknown   Formoterol Fumarate Other (See Comments)    Reaction not recalled   Fosfomycin     Nerve pain in her legs   Gold Sodium Thiosulfate Other (See Comments)    Positive patch test   Gold-Containing Drug Products Other (See Comments)    Skin tingles   Hydralazine    Levofloxacin In D5w Other (See Comments)    Other Reaction: racing heart Other reaction(s): Other (See Comments) Other Reaction: racing heart   Macrodantin [Nitrofurantoin Macrocrystal] Itching   Moxifloxacin Other (See Comments)    Caused hands to "shake"   Neomycin Other (See Comments)    Doesn't remember - allergist said not to use it because it could add more allergies- Positive patch test    Ofloxacin Itching   Praluent [Alirocumab]     shaking   Pregabalin Other (See Comments)   Ranexa [Ranolazine Er]     itching   Repatha [Evolocumab]     shaking   Valsartan Other (See Comments)  Pt reports this med causes her to feel sluggish and she feels heaviness on her shoulders.   Welchol [Colesevelam]    Zetia [Ezetimibe]    Zolpidem     Other reaction(s): Unknown   Zolpidem Tartrate Other (See Comments)   Acetaminophen Rash and Other (See Comments)    Facial rash   Fexofenadine Palpitations and Other (See Comments)    Heart races   Ibuprofen Rash   Latex Rash and Other (See Comments)    Skin gets red    Levofloxacin Palpitations and Other (See Comments)    HEART RACING   Mold Extract [Trichophyton  Mentagrophyte] Other (See Comments)    Bumps on back, stops up sinuses.   Molds & Smuts Rash and Other (See Comments)    Bumps on back, stops up sinuses.   Nickel Rash   Tape Rash   Trichophyton Rash and Other (See Comments)    Bumps on back, stops up sinuses    Review of Systems: Negative except as noted in the HPI.  Objective: No changes noted in today's physical examination.  Donna Bailey is a pleasant 76 y.o. female WD, WN in NAD. AAO x 3.  Vascular Examination: Capillary refill time immediate b/l.Vascular status intact b/l with palpable pedal pulses. Pedal hair present b/l. No edema. No pain with calf compression b/l. Skin temperature gradient WNL b/l.   Neurological Examination: Sensation grossly intact b/l with 10 gram monofilament. Vibratory sensation intact b/l.   Dermatological Examination: Pedal skin with normal turgor, texture and tone b/l. Toenails 1-5 b/l thick, discolored, elongated with subungual debris and pain on dorsal palpation. Hyperkeratotic lesion(s) submet head 4 right foot and submet head 5 right foot.  No erythema, no edema, no drainage, no fluctuance.  Musculoskeletal Examination: Muscle strength 5/5 to all lower extremity muscle groups bilaterally. No pain, crepitus or joint limitation noted with ROM bilateral LE.  Radiographs: None  Assessment/Plan: 1. Pain due to onychomycosis of toenails of both feet     No orders of the defined types were placed in this encounter.   -Patient was evaluated and treated. All patient's and/or POA's questions/concerns answered on today's visit. -Medicare ABN signed. Patient refuses services of paring of calluses  today. Copy has been placed in patient's chart. -Patient to continue soft, supportive shoe gear daily. -Toenails 1-5 b/l were debrided in length and girth with sterile nail nippers and dremel without iatrogenic bleeding.  -Patient/POA to call should there be question/concern in the interim.   Return  in about 3 months (around 08/16/2022).  Marzetta Board, DPM

## 2022-05-26 ENCOUNTER — Telehealth: Payer: Self-pay | Admitting: Allergy

## 2022-05-26 NOTE — Telephone Encounter (Signed)
Called patient and advised. Patient verbalized understanding.  

## 2022-05-26 NOTE — Telephone Encounter (Signed)
Patient would like to know if we do allergy testing for bone cement.   Please advise Best contact number: [phone]

## 2022-05-26 NOTE — Telephone Encounter (Signed)
Please call patient back.  I don't have testing for bone cement.  Recommend contacting dermatology and see if they offer patch testing for bone cement.  She may need to go to an academic center for this. [address] [place], [place])

## 2022-06-04 ENCOUNTER — Telehealth: Payer: Self-pay | Admitting: Pharmacist

## 2022-06-04 DIAGNOSIS — T466X5A Adverse effect of antihyperlipidemic and antiarteriosclerotic drugs, initial encounter: Secondary | ICD-10-CM | POA: Insufficient documentation

## 2022-06-04 DIAGNOSIS — M791 Myalgia, unspecified site: Secondary | ICD-10-CM | POA: Insufficient documentation

## 2022-06-04 NOTE — Telephone Encounter (Signed)
-----   Message from Rowland Lathe, [facility] sent at 06/04/2022  3:09 PM EDT ----- Regarding: [facility] CMS Measure Hi,  This patient is failing [facility] CMS measure as a result to statin intolerance. She is being followed by the lipid clinic, in which it was noted that Ms. Shepheard has myalgias due to statin during 11/26/21 office visit. If deemed clinically appropriate, please consider associating exclusion code (see options below) at the next office visit on 06/05/2022.               Drug Induced Myopathy G72.0  Myositis, unspecified M60.9  Myalgia M79.1  Thank you for allowing pharmacy to be a part of this patient's care.  Kristeen Miss, PharmD Clinical Pharmacist [facility] Cell: [phone]

## 2022-06-04 NOTE — Telephone Encounter (Signed)
Didn't see an office visit from 4/25 but I added it to the visit on 8/14 for you
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
## 2022-06-05 ENCOUNTER — Ambulatory Visit: Payer: Medicare Other | Admitting: Cardiology

## 2022-06-09 NOTE — Progress Notes (Unsigned)
Cardiology Office Note:    Date:  06/10/2022   ID:  Donna Bailey, DOB [birth year], MRN [id number]  PCP:  [name], [place] Prov[facility] Cardiologist:  Freada Bergeron, MD Cardiology APP:  Sharmon [place]vere    Referring MD: [name], MD   Chief Complaint:  F/u for CAD; chest pain    Patient Profile: Coronary artery disease Anterior STEMI 4/19 s/p 2.75 x 23 mm DES to LAD (c/b radial artery dissection/hematoma)  Cath 6/19: LAD stent patent Myoview 09/2018: low risk Cath 11/2018: LAD stent patent, no change since 6/19 NSTEMI 04/2019 in setting of profound ? BP  >> Cath w/ patent LAD stent  Myoview 11/23/19: EF 61, normal perfusion, low risk  Myoview 09/14/20: EF 66, normal perfusion, low risk  Cath 10/22: Moderate LAD, Dx and RCA dz; patent pLAD stent, mid LAD dz (vasospasm or bridging) borderline by RFR >> Med Rx unless refractory angina  Pt declined taking Dilt due to corn allergy Ischemic CM EF returned to normal [40-45 (4/19) >> 65 (5/19)] Echocardiogram 07/17/21: EF 60-65, no RWMA, GR 1 DD, normal RVSF, mild LAE, trivial MR, AV sclerosis without stenosis  Chronic kidney disease stage 2 Aortic atherosclerosis  Peripheral arterial disease S/p L subclavian artery stent 11/2021 (2/2 embolizing L subcl stenosis) C/b R groin hematoma requiring repair of R CFA GERD Barrett's Esophagus Hypertension  Hyperlipidemia  Intol of statins, PCSK9 inhibitors, ezetimibe  Waiting on Inclisiran approval  Asthma  Multiple Med Intolerances/Allergies  Unable to take Plavix, Amlodipine due to allergy to corn filler DC'd Praluent >> shaking DC'd Ranolazine >> Itching DC'd Valsartan >> multiple symptoms Seen by allergist; Food panel w/ corn, red dye negative  Event monitor 01/2019: NSR, sinus tachy, no arrhythmias  Hyponatremia  Vertebral compression Fx   Cardiac Studies & Procedures   CARDIAC CATHETERIZATION  CARDIAC CATHETERIZATION  05/27/2021  Narrative Conclusions: Moderate LAD, diagonal, and RCA disease.  Question vasospasm or myocardial bridging just distal to stent.  Lesion is borderline hemodynamically significant (RFR = 0.89).  Ostial LCx disease appears mild. Normal left ventricular systolic function and filling pressure.  Recommendations: Escalate medical therapy; will add diltiazem 120 mg daily, to be escalated as tolerated.  PCI to LAD should be considered only for refractory angina despite aggressive medical therapy. Secondary prevention of coronary artery disease.  Nelva Bush, MD [facility]  Findings Coronary Findings Diagnostic  Dominance: Right  Left Main Vessel is large. Vessel is angiographically normal.  Left Anterior Descending Previously placed Prox LAD to Mid LAD stent (unknown type) is  widely patent. Mid LAD lesion is 55% stenosed. Pressure wire/FFR was performed on the lesion. RFR = 0.89.  First Diagonal Branch Vessel is large in size. Ost 1st Diag lesion is 40% stenosed. 1st Diag lesion is 60% stenosed. The mid-diagonal has moderate stenosis as it bifurcates into twin branches. The vessel is small to moderate in this region and is unchanged from previous studies  Lateral First Diagonal Branch Lat 1st Diag lesion is 50% stenosed.  Left Circumflex Vessel is moderate in size. Ost Cx to Prox Cx lesion is 20% stenosed. The lesion is focal. nonobstructive ostial left circumflex stenosis. This lesion has been evaluated with FFR and IVUS, both negative for flow-limiting stenosis  First Obtuse Marginal Branch Vessel is small in size.  Second Obtuse Marginal Branch Vessel is small in size.  Third Obtuse Marginal Branch Vessel is small in size.  Right Coronary Artery Vessel is large.  Mid RCA lesion is 40% stenosed.  Intervention  No interventions have been documented.   CARDIAC CATHETERIZATION  CARDIAC CATHETERIZATION 04/26/2019  Narrative 1. Continued patency of  the LAD stent with minimal in-stent restenosis 2. Stable, moderate stenosis of the LCx ostium, mid-diagonal branch, and RCA, all lesions unchanged from previous cath studies 3. Normal LV function with normal LVEDP  Recommend: continued medical therapy. Unclear etiology of cardiac enzyme rise  Findings Coronary Findings Diagnostic  Dominance: Right  Left Main Vessel is large. Vessel is angiographically normal.  Left Anterior Descending Prox LAD to Mid LAD lesion is 25% stenosed. The lesion was previously treated using a drug eluting stent between 1-2 years ago. There is mild diffuse ISR, appears no more than 20-25% narrowed  First Diagonal Branch Vessel is large in size. 1st Diag lesion is 60% stenosed. The mid-diagonal has moderate stenosis as it bifurcates into twin branches. The vessel is small to moderate in this region and is unchanged from previous studies  Lateral First Diagonal Branch Lat 1st Diag lesion is 50% stenosed.  Second Diagonal Branch Vessel is small in size.  Left Circumflex Vessel is moderate in size. Ost Cx to Prox Cx lesion is 40% stenosed. The lesion is focal. nonobstructive ostial left circumflex stenosis. This lesion has been evaluated with FFR and IVUS, both negative for flow-limiting stenosis  First Obtuse Marginal Branch Vessel is small in size.  Second Obtuse Marginal Branch Vessel is small in size.  Third Obtuse Marginal Branch Vessel is small in size.  Right Coronary Artery Vessel is large. Mid RCA lesion is 40% stenosed.  Intervention  No interventions have been documented.   STRESS TESTS  MYOCARDIAL PERFUSION IMAGING 09/14/2020  Narrative  The left ventricular ejection fraction is hyperdynamic (>65%).  Nuclear stress EF: 66%.  There was no ST segment deviation noted during stress.  The study is normal.  This is a low risk study.  Low risk stress nuclear study with normal perfusion and normal left ventricular regional and  global systolic function.   ECHOCARDIOGRAM  ECHOCARDIOGRAM COMPLETE 07/17/2021  Narrative ECHOCARDIOGRAM REPORT    Patient Name:   Donna Bailey Date of Exam: 07/17/2021 Medical Rec #:  [id number]          Height:       66.0 in Accession #:    [id number]         Weight:       189.2 lb Date of Birth:  12/29/1945          BSA:          1.953 m Patient Age:    86 years           BP:           176/104 mmHg Patient Gender: F                  HR:           62 bpm. Exam Location:  [facility]  Procedure: 2D Echo, Cardiac Doppler, Color Doppler and 3D Echo  Indications:    R01.1 Murmur  History:        Patient has prior history of Echocardiogram examinations, most recent 12/04/2017. Ischemic cardiomyopathy, CAD and STEMI; Risk Factors:Hypertension and CKD.  Sonographer:    Marygrace Drought RCS Referring Phys: [id number] [place]   1. Left ventricular ejection fraction, by estimation, is 60 to 65%. The left ventricle has normal function. The left ventricle has no regional  wall motion abnormalities. Left ventricular diastolic parameters are consistent with Grade I diastolic dysfunction (impaired relaxation). 2. Right ventricular systolic function is normal. The right ventricular size is normal. Tricuspid regurgitation signal is inadequate for assessing PA pressure. 3. Left atrial size was mildly dilated. 4. The mitral valve is normal in structure. Trivial mitral valve regurgitation. No evidence of mitral stenosis. 5. The aortic valve is tricuspid. Aortic valve regurgitation is not visualized. Aortic valve sclerosis/calcification is present, without any evidence of aortic stenosis. 6. The inferior vena cava is normal in size with greater than 50% respiratory variability, suggesting right atrial pressure of 3 mmHg.  FINDINGS Left Ventricle: Left ventricular ejection fraction, by estimation, is 60 to 65%. The left ventricle has normal function. The left ventricle  has no regional wall motion abnormalities. The left ventricular internal cavity size was normal in size. There is no left ventricular hypertrophy. Left ventricular diastolic parameters are consistent with Grade I diastolic dysfunction (impaired relaxation).  Right Ventricle: The right ventricular size is normal. No increase in right ventricular wall thickness. Right ventricular systolic function is normal. Tricuspid regurgitation signal is inadequate for assessing PA pressure.  Left Atrium: Left atrial size was mildly dilated.  Right Atrium: Right atrial size was normal in size.  Pericardium: There is no evidence of pericardial effusion.  Mitral Valve: The mitral valve is normal in structure. There is mild calcification of the mitral valve leaflet(s). Mild mitral annular calcification. Trivial mitral valve regurgitation. No evidence of mitral valve stenosis.  Tricuspid Valve: The tricuspid valve is normal in structure. Tricuspid valve regurgitation is not demonstrated.  Aortic Valve: The aortic valve is tricuspid. Aortic valve regurgitation is not visualized. Aortic valve sclerosis/calcification is present, without any evidence of aortic stenosis.  Pulmonic Valve: The pulmonic valve was normal in structure. Pulmonic valve regurgitation is trivial.  Aorta: The aortic root is normal in size and structure.  Venous: The inferior vena cava is normal in size with greater than 50% respiratory variability, suggesting right atrial pressure of 3 mmHg.  IAS/Shunts: No atrial level shunt detected by color flow Doppler.   LEFT VENTRICLE PLAX 2D LVIDd:         4.20 cm   Diastology LVIDs:         2.80 cm   LV e' medial:    8.70 cm/s LV PW:         1.00 cm   LV E/e' medial:  13.9 LV IVS:        0.90 cm   LV e' lateral:   12.90 cm/s LVOT diam:     1.70 cm   LV E/e' lateral: 9.4 LV SV:         59 LV SV Index:   30 LVOT Area:     2.27 cm  3D Volume EF: 3D EF:        59 % LV EDV:       124 ml LV  ESV:       51 ml LV SV:        73 ml  RIGHT VENTRICLE RV Basal diam:  2.30 cm RV S prime:     10.90 cm/s TAPSE (M-mode): 1.8 cm  LEFT ATRIUM             Index        RIGHT ATRIUM          Index LA diam:        3.90 cm 2.00 cm/m   RA Area:  7.54 cm LA Vol (A2C):   37.8 ml 19.35 ml/m  RA Volume:   14.10 ml 7.22 ml/m LA Vol (A4C):   66.3 ml 33.94 ml/m LA Biplane Vol: 50.4 ml 25.80 ml/m AORTIC VALVE LVOT Vmax:   102.00 cm/s LVOT Vmean:  70.600 cm/s LVOT VTI:    0.258 m  AORTA Ao Root diam: 2.40 cm Ao Asc diam:  3.00 cm  MITRAL VALVE MV Area (PHT):              SHUNTS MV Decel Time:              Systemic VTI:  0.26 m MV E velocity: 121.00 cm/s  Systemic Diam: 1.70 cm MV A velocity: 119.00 cm/s MV E/A ratio:  1.02  Dalton McleanMD Electronically signed by Franki Monte Signature Date/Time: 07/17/2021/11:37:14 AM    Final    MONITORS  CARDIAC EVENT MONITOR 01/04/2019  Narrative  Sinus rhythm to sinus tachycardia.  Sinus rhythm to sinus tachycardia. No arrhythmias or pauses. Normal 30 day event monitor.            History of Present Illness:   TAKEYAH WIEMAN is a 76 y.o. female with the above problem list.  She was last seen by Dr. Johney Frame in Aug 2023. Cardiac PET had been ordered at a prior visit. The pt was having symptoms of early satiety (?anginal equiv) and Dr. Johney Frame recommended proceeding with the Cardiac PET. However, it was ultimately decided to hold off on proceeding unless she has chest pain.  She has vertebral compression fx and has been cleared by our office to proceed with kyphoplasty.   She returns for f/u. She is here with her husband. She has delayed her kyphoplasty b/c of concerns about what fillers are in the material and her multiple allergies. She remains in a lot of pain. She arrives in a wheelchair today. She has not had syncope, orthopnea, leg edema. She has noted occasional episodes of L sided chest pain. It is dull and occurs at  rest. She takes NTG with relief. The chest pain is brief - she has NTG close at hand when it occurs. She has had it lying in bed. She has not had any associated radiation, shortness of breath, nausea or diaphoresis. Of note, her house cleaner vacuumed up her Coreg about a month ago. She has been without since then.      Past Medical History:  Diagnosis Date   Allergic rhinitis, cause unspecified    Anemia    Aneurysm of right conjunctiva    right eye    Anxiety    Asthma    Barrett's esophagus    CAD in native artery    a. 11/2017: STEMI s/p DES to prox-mid LAD; LCx stenosis managed medically. Case complicated by R forearm hematoma. b. Several subsequent caths, last in 04/2019 with stable disease // Myoview 11/2019: EF 61, normal perfusion; low risk    CKD (chronic kidney disease), stage II    Constipation    Cystocele    Deviated nasal septum    Diaphragmatic hernia without mention of obstruction or gangrene    Esophageal reflux    Fibromyalgia    H/O hiatal hernia    Hypertension    Insomnia, unspecified    Ischemic cardiomyopathy    a. EF 40-45% by echo 11/2017. // Echo 5/19: No new wall motion abnormalities, EF 65, no pericardial effusion, normal aortic root   Kidney stones    hx of pt see  Dr. Risa Grill   Medication intolerance    numerous   Migraine    Myalgia and myositis, unspecified    Myocardial infarct ([place]) 11/13/2017   Myocardial infarction [facility])    Nuclear stress tests    Cardiolite 2/18: no ischemia or scar, EF 78; Low Risk   Osteoarthrosis, unspecified whether generalized or localized, unspecified site    Pancreatitis    Peripheral neuropathy    Pneumonia    hx of   Pure hypercholesterolemia    Rectocele    Scoliosis (and kyphoscoliosis), idiopathic    Statin intolerance    Temporomandibular joint disorders, unspecified    Urticaria    Wheat allergy    Current Medications: Current Meds  Medication Sig   amitriptyline (ELAVIL) 25 MG tablet Take 1 tablet by  mouth as needed.   cyanocobalamin (,VITAMIN B-12,) 1000 MCG/ML injection 1,000 mcg every 30 (thirty) days.   Digestive Enzyme CAPS Take 2 capsules by mouth daily as needed (with large meals).   diphenhydrAMINE (BENADRYL) 25 mg capsule Take 25 mg by mouth daily as needed for sleep or itching.   esomeprazole (NEXIUM) 40 MG capsule TAKE 1 CAPSULE BY MOUTH TWICE A DAY   famotidine (PEPCID) 40 MG tablet Take 40 mg by mouth at bedtime.   furosemide (LASIX) 40 MG tablet Take 1.5 tablets (60 mg total) by mouth daily.   Gabapentin 10 % CREA Apply 1-2 application. topically at bedtime. Feet   guaiFENesin-codeine 100-10 MG/5ML syrup Take 2.5-5 mLs by mouth daily as needed (Cold).   hydrALAZINE (APRESOLINE) 25 MG tablet Take 1 tablet (25 mg total) by mouth daily as needed (for BP greater than 607 systolic).   lidocaine (LIDODERM) 5 % Place 0.5 patches onto the skin daily as needed (pain).   MAGNESIUM PO Take 300 mg by mouth every evening.   Multiple Vitamins-Minerals (MULTIVITAMIN WITH MINERALS) tablet Take 1 tablet by mouth daily.   NITRO-BID 2 % ointment Apply 1 g topically daily. Finger tips   nitroGLYCERIN (NITROSTAT) 0.4 MG SL tablet Place 1 tablet (0.4 mg total) under the tongue every 5 (five) minutes as needed for chest pain.   ondansetron (ZOFRAN) 4 MG tablet Take 1 tablet (4 mg total) by mouth every 8 (eight) hours as needed for nausea or vomiting.   ondansetron (ZOFRAN-ODT) 4 MG disintegrating tablet Take 1 tablet (4 mg total) by mouth every 6 (six) hours as needed for nausea or vomiting.   oxyCODONE (OXY IR/ROXICODONE) 5 MG immediate release tablet Take 1-2 tablets (5-10 mg total) by mouth every 6 (six) hours as needed for moderate pain or severe pain.   Probiotic Product (PROBIOTIC DAILY PO) Take 1 tablet by mouth daily.   promethazine-codeine (PHENERGAN WITH CODEINE) 6.25-10 MG/5ML syrup Take 5 mLs by mouth every 6 (six) hours as needed for cough.   ticagrelor (BRILINTA) 90 MG TABS tablet Take 1  tablet (90 mg total) by mouth 2 (two) times daily.   tiZANidine (ZANAFLEX) 2 MG tablet Take 2 mg by mouth 2 (two) times daily as needed.   zolpidem (AMBIEN) 10 MG tablet Take 10 mg by mouth at bedtime as needed for sleep.   [DISCONTINUED] carvedilol (COREG) 3.125 MG tablet Take 1 tablet (3.125 mg total) by mouth 2 (two) times daily.    Allergies:   Cephalosporins, Crestor [rosuvastatin], Doxycycline, Formoterol, Other, Sulfa antibiotics, Sulfonamide derivatives, Ciprofloxacin, Nitrofurantoin, Penicillins, Prednisone, Amlodipine, Caffeine, Carvedilol, Cephalexin, Dilt-xr [diltiazem hcl], Esomeprazole, Formoterol fumarate, Fosfomycin, Gold sodium thiosulfate, Gold-containing drug products, Hydralazine, Levofloxacin in d5w,  Macrodantin [nitrofurantoin macrocrystal], Moxifloxacin, Neomycin, Ofloxacin, Praluent [alirocumab], Pregabalin, Ranexa [ranolazine er], Repatha [evolocumab], Valsartan, Welchol [colesevelam], Zetia [ezetimibe], Zolpidem, Zolpidem tartrate, Acetaminophen, Fexofenadine, Ibuprofen, Latex, Levofloxacin, Mold extract [trichophyton mentagrophyte], Molds & smuts, Nickel, Tape, and Trichophyton   Social History   Tobacco Use   Smoking status: Never    Passive exposure: Never   Smokeless tobacco: Never  Vaping Use   Vaping Use: Never used  Substance Use Topics   Alcohol use: No    Alcohol/week: 0.0 standard drinks of alcohol   Drug use: No    Family Hx: The patient's family history includes Breast cancer in her sister; Cancer in her sister; Cancer (age of onset: 74) in her sister; Colitis in her mother; Colon polyps in her brother; Diverticulosis in her mother; Heart disease in her father and mother; Leukemia in her sister; Tuberculosis in her brother. There is no history of Stomach cancer, Rectal cancer, Esophageal cancer, or Colon cancer.  Review of Systems  Gastrointestinal:  Negative for hematochezia.  Genitourinary:  Negative for hematuria.     EKGs/Labs/Other Test Reviewed:     EKG:  EKG is   ordered today.  The ekg ordered today demonstrates sinus tachycardia, HR 107, normal axis, PACs, nonspecific ST-T wave changes, QTc 469, similar to prior tracing   Recent Labs: 09/25/2021: NT-Pro BNP 817; TSH 1.470 01/21/2022: ALT 19; BUN 12; Creatinine, Ser 1.03; Hemoglobin 15.2; Platelets 306.0; Potassium 4.7; Sodium 134   Recent Lipid Panel Recent Labs    11/26/21 0331  CHOL 143  TRIG 135  HDL 20*  VLDL 27  LDLCALC 96      Risk Assessment/Calculations/Metrics:              Physical Exam:    VS:  BP 110/62   Pulse 86   Ht '5\' 6"'$  (1.676 m)   Wt 145 lb 6.4 oz (66 kg)   SpO2 95%   BMI 23.47 kg/m     Wt Readings from Last 3 Encounters:  06/10/22 145 lb 6.4 oz (66 kg)  04/29/22 158 lb (71.7 kg)  03/17/22 166 lb (75.3 kg)    Constitutional:      Appearance: Frail.  Neck:     Vascular: No JVR.  Pulmonary:     Effort: Pulmonary effort is normal.     Breath sounds: No wheezing. No rales.  Cardiovascular:     Normal rate. Regular rhythm. Normal S1. Normal S2.      Murmurs: There is no murmur.  Edema:    Peripheral edema absent.  Abdominal:     Palpations: Abdomen is soft.  Musculoskeletal:     Cervical back: Neck supple. Skin:    General: Skin is warm and dry.  Neurological:     Mental Status: Alert and oriented to person, place and time.          ASSESSMENT & PLAN:   Coronary artery disease involving native coronary artery with angina pectoris ([place]) S/p anterior STEMI in 11/2017 tx with DES to LAD. She has had multiple cardiac catheterizations since that time. Her last cath in 05/2021 demonstrated patent LAD stent and mod residual CAD in the LAD, Dx, RCA managed medically. She was also treated for vasospasm. She has been out of Coreg for the last month. Her HR is faster partly due to ongoing pain with her back. I suspect her HR is also faster b/c she is off of Coreg. She has had some L sided chest pain relieved with NTG over the past  month. She  does not have any acute changes on her EKG today. She was to have a Cardiac PET but this was placed on hold unless she had chest pain. I recommend she undergo a Lexiscan Myoview to rule out ischemia. She is concerned about having to sit to wait for the portions of her test (sitting makes her back pain worse). We have asked her to call us when she is ready to schedule. Restart Coreg 3.125 mg twice daily. Continue Brilinta 90 mg once daily. She is intol of statins, PCSK9 inhib. She has declined Rx with Inclisiran (notes concerns over liver disease). F/u 6 mos.   PAD (peripheral artery disease) (HCC) S/p L subclavian stent in April 2023 with Dr. Donzetta Matters. I have asked her to contact Dr. Claretha Cooper office to see how long she needs to remain on Brilinta 90 mg. From a CV perspective, she will need to remain on at least Brilinta 60 mg twice daily long term.   History of vertebral fracture She continues to have significant pain from her compression fx. She is waiting to find out what fillers are used in the material for the procedure. She is concerned about her multiple allergies.   Hyperlipidemia LDL goal <70 Intol to statins, PCSK9 inhib's. She has declined Inclisiran.  Essential hypertension The patient's blood pressure is controlled on her current regimen.  Continue current therapy.           Shared Decision Making/Informed Consent The risks [chest pain, shortness of breath, cardiac arrhythmias, dizziness, blood pressure fluctuations, myocardial infarction, stroke/transient ischemic attack, nausea, vomiting, allergic reaction, radiation exposure, metallic taste sensation and life-threatening complications (estimated to be 1 in 10,000)], benefits (risk stratification, diagnosing coronary artery disease, treatment guidance) and alternatives of a nuclear stress test were discussed in detail with Ms. Jobe and she agrees to proceed.   Dispo:  Return in about 6 months (around 12/09/2022) for Routine Follow Up, w/  Dr. Johney Frame.   Medication Adjustments/Labs and Tests Ordered: Current medicines are reviewed at length with the patient today.  Concerns regarding medicines are outlined above.  Tests Ordered: Orders Placed This Encounter  Procedures   Cardiac Stress Test: Informed Consent Details: Physician/Practitioner Attestation; Transcribe to consent form and obtain patient signature   MYOCARDIAL PERFUSION IMAGING   EKG 12-Lead   Medication Changes: Meds ordered this encounter  Medications   carvedilol (COREG) 3.125 MG tablet    Sig: Take 1 tablet (3.125 mg total) by mouth 2 (two) times daily.    Dispense:  180 tablet    Refill:  3   Signed, Richardson Dopp, PA-C  06/10/2022 12:09 PM    [address], [place],   [address] Phone: [phone]; Fax: [phone]

## 2022-06-09 NOTE — Telephone Encounter (Signed)
Hi,   My apologies about. I appreciate your help.   Thanks,  Kristeen Miss, PharmD

## 2022-06-10 ENCOUNTER — Encounter: Payer: Self-pay | Admitting: Physician Assistant

## 2022-06-10 ENCOUNTER — Ambulatory Visit: Payer: Medicare Other | Attending: Cardiology | Admitting: Physician Assistant

## 2022-06-10 VITALS — BP 110/62 | HR 86 | Ht 66.0 in | Wt 145.4 lb

## 2022-06-10 DIAGNOSIS — I2510 Atherosclerotic heart disease of native coronary artery without angina pectoris: Secondary | ICD-10-CM

## 2022-06-10 DIAGNOSIS — I10 Essential (primary) hypertension: Secondary | ICD-10-CM

## 2022-06-10 DIAGNOSIS — E785 Hyperlipidemia, unspecified: Secondary | ICD-10-CM | POA: Diagnosis not present

## 2022-06-10 DIAGNOSIS — M4850XA Collapsed vertebra, not elsewhere classified, site unspecified, initial encounter for fracture: Secondary | ICD-10-CM | POA: Insufficient documentation

## 2022-06-10 DIAGNOSIS — I25119 Atherosclerotic heart disease of native coronary artery with unspecified angina pectoris: Secondary | ICD-10-CM

## 2022-06-10 DIAGNOSIS — Z8781 Personal history of (healed) traumatic fracture: Secondary | ICD-10-CM

## 2022-06-10 DIAGNOSIS — R079 Chest pain, unspecified: Secondary | ICD-10-CM | POA: Diagnosis not present

## 2022-06-10 DIAGNOSIS — I739 Peripheral vascular disease, unspecified: Secondary | ICD-10-CM

## 2022-06-10 MED ORDER — CARVEDILOL 3.125 MG PO TABS: 3.1250 mg | ORAL_TABLET | Freq: Two times a day (BID) | ORAL | 3 refills | Status: DC

## 2022-06-10 NOTE — Assessment & Plan Note (Signed)
The patient's blood pressure is controlled on her current regimen.  Continue current therapy.   

## 2022-06-10 NOTE — Patient Instructions (Signed)
Medication Instructions:  Your physician recommends that you continue on your current medications as directed. Please refer to the Current Medication list given to you today.  *If you need a refill on your cardiac medications before your next appointment, please call your pharmacy*   Lab Work: None ordered  If you have labs (blood work) drawn today and your tests are completely normal, you will receive your results only by: [place] (if you have MyChart) OR A paper copy in the mail If you have any lab test that is abnormal or we need to change your treatment, we will call you to review the results.   Testing/Procedures: Your physician has requested that you have a lexiscan myoview. For further information please visit [email or web address]. Please follow instruction sheet, BELOW:    You are scheduled for a Myocardial Perfusion Imaging Study Please arrive 15 minutes prior to your appointment time for registration and insurance purposes.  The test will take approximately 3 to 4 hours to complete; you may bring reading material.  If someone comes with you to your appointment, they will need to remain in the main lobby due to limited space in the testing area. **If you are pregnant or breastfeeding, please notify the nuclear lab prior to your appointment**  How to prepare for your Myocardial Perfusion Test: Do not eat or drink 3 hours prior to your test, except you may have water. Do not consume products containing caffeine (regular or decaffeinated) 12 hours prior to your test. (ex: coffee, chocolate, sodas, tea). Do bring a list of your current medications with you.  If not listed below, you may take your medications as normal. Do wear comfortable clothes (no dresses or overalls) and walking shoes, tennis shoes preferred (No heels or open toe shoes are allowed). Do NOT wear cologne, perfume, aftershave, or lotions (deodorant is allowed). If these instructions are not followed, your  test will have to be rescheduled.     Follow-Up: At [facility], you and your health needs are our priority.  As part of our continuing mission to provide you with exceptional heart care, we have created designated Provider Care Teams.  These Care Teams include your primary Cardiologist (physician) and Advanced Practice Providers (APPs -  Physician Assistants and Nurse Practitioners) who all work together to provide you with the care you need, when you need it.  We recommend signing up for the patient portal called "MyChart".  Sign up information is provided on this After Visit Summary.  MyChart is used to connect with patients for Virtual Visits (Telemedicine).  Patients are able to view lab/test results, encounter notes, upcoming appointments, etc.  Non-urgent messages can be sent to your provider as well.   To learn more about what you can do with MyChart, go to [email or web address].    Your next appointment:   6 month(s)  The format for your next appointment:   In Person  Provider:   Freada Bergeron, MD     Other Instructions   Important Information About Sugar

## 2022-06-10 NOTE — Assessment & Plan Note (Signed)
She continues to have significant pain from her compression fx. She is waiting to find out what fillers are used in the material for the procedure. She is concerned about her multiple allergies.

## 2022-06-10 NOTE — Assessment & Plan Note (Signed)
Intol to statins, PCSK9 inhib's. She has declined Inclisiran.

## 2022-06-10 NOTE — Assessment & Plan Note (Signed)
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 She has had multiple cardiac catheterizations since that time. Her last cath in 05/2021 demonstrated patent LAD stent and mod residual CAD in the LAD, Dx, RCA managed medically. She was also treated for vasospasm. She has been out of Coreg for the last month. Her HR is faster partly due to ongoing pain with her back. I suspect her HR is also faster b/c she is off of Coreg. She has had some L sided chest pain relieved with NTG over the past month. She does not have any acute changes on her EKG today. She was to have a Cardiac PET but this was placed on hold unless she had chest pain. I recommend she undergo a Lexiscan Myoview to rule out ischemia. She is concerned about having to sit to wait for the portions of her test (sitting makes her back pain worse). We have asked her to call us when she is ready to schedule. Restart Coreg 3.125 mg twice daily. Continue Brilinta 90 mg once daily. She is intol of statins, PCSK9 inhib. She has declined Rx with Inclisiran (notes concerns over liver disease). F/u 6 mos.

## 2022-06-10 NOTE — Assessment & Plan Note (Signed)
S/p L subclavian stent in April 2023 with Dr. Donzetta Matters. I have asked her to contact Dr. Claretha Cooper office to see how long she needs to remain on Brilinta 90 mg. From a CV perspective, she will need to remain on at least Brilinta 60 mg twice daily long term.

## 2022-06-15 NOTE — Progress Notes (Deleted)
   Follow Up Note  RE: [name] MRN: [id number] DOB: 10-25-1945 Date of Office Visit: 06/16/2022  Referring provider: [name], MD Primary care provider: [name], MD  History of Present Illness: I had the pleasure of seeing Donna Bailey for a follow up visit at the [facility] [place] on 06/15/2022. She is a 76 y.o. 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 Today she is here for patch test placement, given suspected history of contact dermatitis.   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  No specific triggers noted.  Concerned about allergies and metal allergies.  Denies Sjogren's diagnosis. Today's skin prick testing showed: Negative to indoor/outdoor allergens and food panel. Recommend follow up with dentist. Discussed that her symptoms are not caused by environmental or food allergies.  Gave handout on burning mouth syndrome. Given her history of neuropathy question if gabapentin may help but it has inactive ingredient of cornstarch - patient had issues with corn fillers in the past.  Consider metal patch testing as she is having some irritation from her dental work with titanium - this is done our [facility] or [facility].    Multiple drug allergies Continue to avoid drugs that bother you - the ones with corn fillers.  For mild symptoms you can take over the counter antihistamines such as Benadryl and monitor symptoms closely. If symptoms worsen or if you have severe symptoms including breathing issues, throat closure, significant swelling, whole body hives, severe diarrhea and vomiting, lightheadedness then seek immediate medical care.  Diagnostics: TRUE and metal Test patches placed.   Assessment and Plan: [name] is a 76 y.o. female with: No problem-specific Assessment & Plan notes found for this encounter.  The patient was instructed regarding proper care of  the patches for the next 48 hours. Do not get patches wet - avoid showering until the next visit. Do not engage in vigorous physical activity. Patient will follow up in 48 hours and 96 hours for patch readings.  It was my pleasure to see [name] today and participate in her care. Please feel free to contact me with any questions or concerns.  Sincerely,  Rexene Alberts, DO Allergy & Immunology  [facility] office: [phone] [facility] office: [place] office: [phone]

## 2022-06-16 ENCOUNTER — Encounter: Payer: Medicare Other | Admitting: Allergy

## 2022-06-16 DIAGNOSIS — Z889 Allergy status to unspecified drugs, medicaments and biological substances status: Secondary | ICD-10-CM

## 2022-06-18 ENCOUNTER — Encounter: Payer: Medicare Other | Admitting: Internal Medicine

## 2022-06-20 ENCOUNTER — Encounter: Payer: Medicare Other | Admitting: Allergy

## 2022-07-17 ENCOUNTER — Other Ambulatory Visit (HOSPITAL_COMMUNITY): Payer: Self-pay

## 2022-07-18 ENCOUNTER — Telehealth: Payer: Self-pay | Admitting: Internal Medicine

## 2022-07-18 NOTE — Telephone Encounter (Signed)
PT has asked [facility] to change call into them for script, not [facility] as originally requested. PT wants to PU at [facility].  Juliann Pulse is at [facility] @ [phone]. She had to call us because this is a controlled substance cough syrup . TY  [facility] NOTES: [facility]

## 2022-07-22 MED ORDER — HYDROCODONE BIT-HOMATROP MBR 5-1.5 MG/5ML PO SOLN: 5.0000 mL | Freq: Four times a day (QID) | ORAL | 0 refills | Status: DC | PRN

## 2022-07-22 NOTE — Telephone Encounter (Signed)
Hydromet sent to [facility] Advise caution- several sedatives on med list.

## 2022-07-22 NOTE — Telephone Encounter (Signed)
Spoke with the pt  She is homebound and needs pharmacy that will deliver- wants to use [facility]  Rx [facility] hydromet [facility]  [facility] and [facility]et  Dr [name]- please sent this to [facility] thanks!

## 2022-07-23 NOTE — Telephone Encounter (Signed)
Pt aware of response from Dr [name]  Nothing further needed

## 2022-07-24 ENCOUNTER — Emergency Department (HOSPITAL_COMMUNITY)
Admission: EM | Admit: 2022-07-24 | Discharge: 2022-07-25 | Disposition: A | Discharge status: Home / Self Care | Payer: Medicare Other | Attending: Emergency Medicine | Admitting: Emergency Medicine

## 2022-07-24 DIAGNOSIS — R630 Anorexia: Secondary | ICD-10-CM | POA: Diagnosis not present

## 2022-07-24 DIAGNOSIS — R531 Weakness: Secondary | ICD-10-CM | POA: Diagnosis present

## 2022-07-24 DIAGNOSIS — R0689 Other abnormalities of breathing: Secondary | ICD-10-CM | POA: Diagnosis not present

## 2022-07-24 DIAGNOSIS — N179 Acute kidney failure, unspecified: Secondary | ICD-10-CM | POA: Insufficient documentation

## 2022-07-24 DIAGNOSIS — Z91040 Latex allergy status: Secondary | ICD-10-CM | POA: Insufficient documentation

## 2022-07-24 DIAGNOSIS — R109 Unspecified abdominal pain: Secondary | ICD-10-CM | POA: Insufficient documentation

## 2022-07-24 LAB — BASIC METABOLIC PANEL
Anion gap: 18 — ABNORMAL HIGH (ref 5–15)
BUN: 14 mg/dL (ref 8–23)
CO2: 20 mmol/L — ABNORMAL LOW (ref 22–32)
Calcium: 8.6 mg/dL — ABNORMAL LOW (ref 8.9–10.3)
Chloride: 94 mmol/L — ABNORMAL LOW (ref 98–111)
Creatinine, Ser: 1.51 mg/dL — ABNORMAL HIGH (ref 0.44–1.00)
GFR, Estimated: 36 mL/min — ABNORMAL LOW (ref >60)
Glucose, Bld: 79 mg/dL (ref 70–99)
Potassium: 3.3 mmol/L — ABNORMAL LOW (ref 3.5–5.1)
Sodium: 132 mmol/L — ABNORMAL LOW (ref 135–145)

## 2022-07-24 LAB — CBC
HCT: 46.1 % — ABNORMAL HIGH (ref 36.0–46.0)
Hemoglobin: 15.2 g/dL — ABNORMAL HIGH (ref 12.0–15.0)
MCH: 31.5 pg (ref 26.0–34.0)
MCHC: 33 g/dL (ref 30.0–36.0)
MCV: 95.4 fL (ref 80.0–100.0)
Platelets: 317 10*3/uL (ref 150–400)
RBC: 4.83 MIL/uL (ref 3.87–5.11)
RDW: 17.2 % — ABNORMAL HIGH (ref 11.5–15.5)
WBC: 11.4 10*3/uL — ABNORMAL HIGH (ref 4.0–10.5)
nRBC: 0 % (ref 0.0–0.2)

## 2022-07-24 LAB — TROPONIN I (HIGH SENSITIVITY)
Troponin I (High Sensitivity): 10 ng/L (ref <18)
Troponin I (High Sensitivity): 16 ng/L (ref <18)

## 2022-07-24 LAB — BRAIN NATRIURETIC PEPTIDE: B Natriuretic Peptide: 73.5 pg/mL (ref 0.0–100.0)

## 2022-07-24 NOTE — ED Provider Triage Note (Signed)
Emergency Medicine Provider Triage Evaluation Note  Donna Bailey , a 76 y.o. female  was evaluated in triage.  Pt complains of generalized weakness.  Patient states she has a history of pancreatic insufficiency and believes it is related.  She states that her weakness has been worsening over the past weeks to months.  She is alert and oriented but appears somewhat lethargic at this time.  Patient denies shortness of breath, chest pain, abdominal pain, nausea, vomiting, headache, urinary symptoms.  Review of Systems  Positive: As above Negative: As above  Physical Exam  BP 119/78   Pulse 94   Temp 97.7 F (36.5 C) (Oral)   Resp 18   SpO2 98%  Gen:   Awake, no distress   Resp:  Normal effort  MSK:   Moves extremities without difficulty  Other:    Medical Decision Making  Medically screening exam initiated at 2:46 PM.  Appropriate orders placed.  Donna Bailey was informed that the remainder of the evaluation will be completed by another provider, this initial triage assessment does not replace that evaluation, and the importance of remaining in the ED until their evaluation is complete.  Patient states she is a "hard stick" and typically requires the IV team for access   Dorothyann Peng, PA-C 07/24/22 1454

## 2022-07-24 NOTE — ED Triage Notes (Signed)
Patient BIB [facility] from home for evaluation of generalized weakness to what she reports is due to pancreatic insufficiency. Patient states this started several months ago. Patient is alert, oriented, and in no apparent distress at this time.  Patient requests that her bedding only include 100% cotton sheets.

## 2022-07-24 NOTE — ED Notes (Signed)
Spoke top [employer] regarding pts belongings.  [facility] is who transported her here and dispatcher is going to check with them about belongings that may have been left on the ambulance.

## 2022-07-24 NOTE — Progress Notes (Addendum)
Manufacturing engineer [facility]) Hospital Liaison Note  This is a pending [facility] hospice referral and we will follow the patient for discharge disposition to set up AV upon discharge.   Please call with any questions or concerns. Thank you  Roselee Nova, [facility] Hospital Liaison [phone]

## 2022-07-25 ENCOUNTER — Other Ambulatory Visit: Payer: Self-pay

## 2022-07-25 ENCOUNTER — Emergency Department (HOSPITAL_COMMUNITY): Payer: Medicare Other

## 2022-07-25 LAB — URINALYSIS, ROUTINE W REFLEX MICROSCOPIC
Bilirubin Urine: NEGATIVE
Glucose, UA: NEGATIVE mg/dL
Hgb urine dipstick: NEGATIVE
Ketones, ur: 20 mg/dL — AB
Nitrite: NEGATIVE
Protein, ur: NEGATIVE mg/dL
Specific Gravity, Urine: 1.013 (ref 1.005–1.030)
pH: 5 (ref 5.0–8.0)

## 2022-07-25 MED ORDER — LACTATED RINGERS IV BOLUS
2000.0000 mL | Freq: Once | INTRAVENOUS | Status: AC
  Administered 2022-07-25: 2000 mL via INTRAVENOUS

## 2022-07-25 MED ORDER — ONDANSETRON HCL 4 MG/2ML IJ SOLN
4.0000 mg | Freq: Once | INTRAMUSCULAR | Status: AC
  Administered 2022-07-25: 4 mg via INTRAVENOUS
  Filled 2022-07-25: qty 2

## 2022-07-25 NOTE — ED Provider Notes (Signed)
[facility] EMERGENCY DEPARTMENT Provider Note   CSN: [id number] Arrival date & time: 07/24/22  1357     History  Chief Complaint  Patient presents with   Weakness    Donna Bailey is a 75 y.o. female.  Patient with multimedical problems and multiple allergies listed in chart who presents the ER today for nonspecific complaints.  Patient dates her last multiple month she has had decreased ability to tolerate p.o.  She feels that she is dehydrated.  She has some abdominal pain that comes and goes.  She been treated for an UTI recently with gentamicin.  She has reported history of pancreatic insufficiency.  No fevers.  No nausea or vomiting just decreased appetite and decreased ability to eat food.  Apparently seen by social worker or her primary doctor yesterday at home and was told that she needed to be evaluated emergency room.   Weakness      Home Medications Prior to Admission medications   Medication Sig Start Date End Date Taking? Authorizing Provider  amitriptyline (ELAVIL) 25 MG tablet Take 1 tablet by mouth as needed.    [provider]  carvedilol (COREG) 3.125 MG tablet Take 1 tablet (3.125 mg total) by mouth 2 (two) times daily. 06/10/22 06/10/23  Richardson Dopp T, PA-C  cyanocobalamin (,VITAMIN B-12,) 1000 MCG/ML injection 1,000 mcg every 30 (thirty) days.    [provider]  Digestive Enzyme CAPS Take 2 capsules by mouth daily as needed (with large meals).    [provider]  diphenhydrAMINE (BENADRYL) 25 mg capsule Take 25 mg by mouth daily as needed for sleep or itching.    [provider]  esomeprazole (NEXIUM) 40 MG capsule TAKE 1 CAPSULE BY MOUTH TWICE A DAY 01/06/22   Zehr, Janett Billow D, PA-C  famotidine (PEPCID) 40 MG tablet Take 40 mg by mouth at bedtime.    [provider]  furosemide (LASIX) 40 MG tablet Take 1.5 tablets (60 mg total) by mouth daily. 09/26/21   Richardson Dopp T, PA-C  Gabapentin 10 %  CREA Apply 1-2 application. topically at bedtime. Feet    [provider]  guaiFENesin-codeine 100-10 MG/5ML syrup Take 2.5-5 mLs by mouth daily as needed (Cold). 04/29/22   Deneise Lever, MD  hydrALAZINE (APRESOLINE) 25 MG tablet Take 1 tablet (25 mg total) by mouth daily as needed (for BP greater than 462 systolic). 09/11/20   Dorothy Spark, MD  HYDROcodone bit-homatropine (HYDROMET) 5-1.5 MG/5ML syrup Take 5 mLs by mouth every 6 (six) hours as needed for cough. 07/22/22   Baird Lyons D, MD  lidocaine (LIDODERM) 5 % Place 0.5 patches onto the skin daily as needed (pain). 11/04/20   [provider]  MAGNESIUM PO Take 300 mg by mouth every evening.    [provider]  Multiple Vitamins-Minerals (MULTIVITAMIN WITH MINERALS) tablet Take 1 tablet by mouth daily.    [provider]  NITRO-BID 2 % ointment Apply 1 g topically daily. Finger tips 10/31/21   [provider]  nitroGLYCERIN (NITROSTAT) 0.4 MG SL tablet Place 1 tablet (0.4 mg total) under the tongue every 5 (five) minutes as needed for chest pain. 11/16/17   Lyda Jester M, PA-C  ondansetron (ZOFRAN) 4 MG tablet Take 1 tablet (4 mg total) by mouth every 8 (eight) hours as needed for nausea or vomiting. 11/28/20   Zehr, Janett Billow D, PA-C  ondansetron (ZOFRAN-ODT) 4 MG disintegrating tablet Take 1 tablet (4 mg total) by mouth every  6 (six) hours as needed for nausea or vomiting. 01/20/22   Noralyn Pick, NP  oxyCODONE (OXY IR/ROXICODONE) 5 MG immediate release tablet Take 1-2 tablets (5-10 mg total) by mouth every 6 (six) hours as needed for moderate pain or severe pain. 11/27/21   [name], PA-C  Probiotic Product (PROBIOTIC DAILY PO) Take 1 tablet by mouth daily.    [provider]  promethazine-codeine (PHENERGAN WITH CODEINE) 6.25-10 MG/5ML syrup Take 5 mLs by mouth every 6 (six) hours as needed for cough. 09/03/21   Deneise Lever, MD  ticagrelor (BRILINTA) 90 MG  TABS tablet Take 1 tablet (90 mg total) by mouth 2 (two) times daily. 11/27/21   [name], PA-C  tiZANidine (ZANAFLEX) 2 MG tablet Take 2 mg by mouth 2 (two) times daily as needed. 03/20/22   [provider]  zolpidem (AMBIEN) 10 MG tablet Take 10 mg by mouth at bedtime as needed for sleep.    [provider]      Allergies    Cephalosporins, Crestor [rosuvastatin], Doxycycline, Formoterol, Other, Sulfa antibiotics, Sulfonamide derivatives, Ciprofloxacin, Nitrofurantoin, Penicillins, Prednisone, Amlodipine, Caffeine, Carvedilol, Cephalexin, Dilt-xr [diltiazem hcl], Esomeprazole, Formoterol fumarate, Fosfomycin, Gold sodium thiosulfate, Gold-containing drug products, Hydralazine, Levofloxacin in d5w, Macrodantin [nitrofurantoin macrocrystal], Moxifloxacin, Neomycin, Ofloxacin, Praluent [alirocumab], Pregabalin, Ranexa [ranolazine er], Repatha [evolocumab], Valsartan, Welchol [colesevelam], Zetia [ezetimibe], Zolpidem, Zolpidem tartrate, Acetaminophen, Fexofenadine, Ibuprofen, Latex, Levofloxacin, Mold extract [trichophyton mentagrophyte], Molds & smuts, Nickel, Tape, and Trichophyton    Review of Systems   Review of Systems  Neurological:  Positive for weakness.    Physical Exam Updated Vital Signs BP (!) 143/70   Pulse 87   Temp 98.3 F (36.8 C)   Resp 14   SpO2 100%  Physical Exam Vitals and nursing note reviewed.  Constitutional:      Appearance: She is well-developed.  HENT:     Head: Normocephalic and atraumatic.     Mouth/Throat:     Mouth: Mucous membranes are moist.  Eyes:     Pupils: Pupils are equal, round, and reactive to light.  Cardiovascular:     Rate and Rhythm: Normal rate and regular rhythm.  Pulmonary:     Effort: Pulmonary effort is normal. No respiratory distress.     Breath sounds: No stridor.  Abdominal:     General: Abdomen is flat. There is no distension.  Musculoskeletal:     Cervical back: Normal range of motion.  Skin:     General: Skin is warm and dry.  Neurological:     General: No focal deficit present.     Mental Status: She is alert.     ED Results / Procedures / Treatments   Labs (all labs ordered are listed, but only abnormal results are displayed) Labs Reviewed  BASIC METABOLIC PANEL - Abnormal; Notable for the following components:      Result Value   Sodium 132 (*)    Potassium 3.3 (*)    Chloride 94 (*)    CO2 20 (*)    Creatinine, Ser 1.51 (*)    Calcium 8.6 (*)    GFR, Estimated 36 (*)    Anion gap 18 (*)    All other components within normal limits  CBC - Abnormal; Notable for the following components:   WBC 11.4 (*)    Hemoglobin 15.2 (*)    HCT 46.1 (*)    RDW 17.2 (*)    All other components within normal limits  URINALYSIS, ROUTINE W  REFLEX MICROSCOPIC - Abnormal; Notable for the following components:   Color, Urine AMBER (*)    APPearance CLOUDY (*)    Ketones, ur 20 (*)    Leukocytes,Ua SMALL (*)    Bacteria, UA FEW (*)    All other components within normal limits  URINE CULTURE  BRAIN NATRIURETIC PEPTIDE  CBG MONITORING, ED  TROPONIN I (HIGH SENSITIVITY)  TROPONIN I (HIGH SENSITIVITY)    EKG None  Radiology CT Renal Stone Study  Result Date: 07/25/2022 CLINICAL DATA:  Abdominal and flank pain. EXAM: CT ABDOMEN AND PELVIS WITHOUT CONTRAST TECHNIQUE: Multidetector CT imaging of the abdomen and pelvis was performed following the standard protocol without IV contrast. RADIATION DOSE REDUCTION: This exam was performed according to the departmental dose-optimization program which includes automated exposure control, adjustment of the mA and/or kV according to patient size and/or use of iterative reconstruction technique. COMPARISON:  11/08/2021 FINDINGS: Lower chest: No acute findings. Hepatobiliary: No mass visualized on this unenhanced exam. Gallbladder is unremarkable. No evidence of biliary ductal dilatation. Pancreas: No mass or inflammatory process visualized on  this unenhanced exam. Spleen:  Within normal limits in size. Adrenals/Urinary tract: No evidence of urolithiasis or hydronephrosis. Unremarkable unopacified urinary bladder. Stomach/Bowel: Moderate hiatal hernia is seen. No evidence of obstruction, inflammatory process, or abnormal fluid collections. Mild sigmoid diverticulosis is seen, without evidence of diverticulosis. Vascular/Lymphatic: No pathologically enlarged lymph nodes identified. No evidence of abdominal aortic aneurysm. Aortic atherosclerotic calcification incidentally noted. Reproductive: Prior hysterectomy noted. Adnexal regions are unremarkable in appearance. Other:  None. Musculoskeletal: Compression fracture of the L2 vertebral body is stable, however there are multiple new vertebral body compression fractures from levels of T10-L4 which are new since prior exam. Alignment remains normal. IMPRESSION: No evidence of urolithiasis or hydronephrosis. Moderate hiatal hernia. Mild sigmoid diverticulosis, without radiographic evidence of diverticulitis. Multiple new vertebral body compression fractures since prior exam, from levels of T10-L4. Electronically Signed   By: Marlaine Hind M.D.   On: 07/25/2022 06:48    Procedures Procedures    Medications Ordered in ED Medications  lactated ringers bolus 2,000 mL (0 mLs Intravenous Stopped 07/25/22 0500)  ondansetron (ZOFRAN) injection 4 mg (4 mg Intravenous Given 07/25/22 8416)    ED Course/ Medical Decision Making/ A&P                           Medical Decision Making Amount and/or Complexity of Data Reviewed Labs: ordered. Radiology: ordered.  Risk Prescription drug management.   Patient with mild AKI.  Fluids given.  Cloudy urine with a few bacteria but not overtly infected so we will get a urine culture prior to treating since she oftentimes is colonized.  CT scan done showed hiatal hernia which could be related to her symptoms however no large masses or pancreatic abnormalities to  suggest need for hospitalization or further workup.  Patient will follow-up with her primary doctor for recheck of her kidney function and further management of her symptoms.  Appears well vitals are stable at the time of discharge.  Final Clinical Impression(s) / ED Diagnoses Final diagnoses:  Weakness  AKI (acute kidney injury) [facility])    Rx / [place] Orders ED Discharge Orders     None         Marvelous Woolford, Corene Cornea, MD 07/25/22 [phone]

## 2022-07-25 NOTE — ED Notes (Signed)
Pt ambulated to bathroom with assistance of this RN and EMT.

## 2022-07-25 NOTE — ED Notes (Signed)
IV attempted x3 without success

## 2022-07-27 LAB — URINE CULTURE: Culture: NO GROWTH

## 2022-07-29 ENCOUNTER — Other Ambulatory Visit: Payer: Self-pay

## 2022-07-29 ENCOUNTER — Telehealth: Payer: Self-pay | Admitting: Physician Assistant

## 2022-07-29 DIAGNOSIS — R110 Nausea: Secondary | ICD-10-CM

## 2022-07-29 MED ORDER — ONDANSETRON 4 MG PO TBDP: 4.0000 mg | ORAL_TABLET | Freq: Four times a day (QID) | ORAL | 1 refills | Status: DC | PRN

## 2022-07-29 NOTE — Telephone Encounter (Signed)
Inbound call from patient stating that she has an appointment tomorrow with Amy at 9:30 and is wanting to know if there is anyway she can have the appointment change to a virtual appointment. Patient stated that she had been in the hospital and does not feel like she can make it to the appointment and does not have anyone who can bring her. Patient is requesting a call back to discuss. Please advise.

## 2022-07-29 NOTE — Telephone Encounter (Signed)
Spoke with the patient. She is trying to find a ride to her appointment tomorrow. Patient did not want to cancel it yet. Scheduled her for 08/12/22 at 2:240. Advised her to be here at 2:30 pm.  Patient wants an EGD. Complains of stomach pain, cramps and nausea. She requests a refill on Zofran ODT.

## 2022-07-30 ENCOUNTER — Other Ambulatory Visit (INDEPENDENT_AMBULATORY_CARE_PROVIDER_SITE_OTHER): Payer: Medicare Other

## 2022-07-30 ENCOUNTER — Encounter: Payer: Self-pay | Admitting: Physician Assistant

## 2022-07-30 ENCOUNTER — Other Ambulatory Visit: Payer: Self-pay

## 2022-07-30 ENCOUNTER — Ambulatory Visit (INDEPENDENT_AMBULATORY_CARE_PROVIDER_SITE_OTHER): Payer: Medicare Other | Admitting: Physician Assistant

## 2022-07-30 ENCOUNTER — Telehealth: Payer: Self-pay

## 2022-07-30 VITALS — BP 120/80 | HR 68

## 2022-07-30 DIAGNOSIS — G8929 Other chronic pain: Secondary | ICD-10-CM

## 2022-07-30 DIAGNOSIS — R109 Unspecified abdominal pain: Secondary | ICD-10-CM

## 2022-07-30 DIAGNOSIS — M549 Dorsalgia, unspecified: Secondary | ICD-10-CM

## 2022-07-30 DIAGNOSIS — R112 Nausea with vomiting, unspecified: Secondary | ICD-10-CM | POA: Diagnosis not present

## 2022-07-30 LAB — CBC WITH DIFFERENTIAL/PLATELET
Basophils Absolute: 0.1 10*3/uL (ref 0.0–0.1)
Basophils Relative: 1 % (ref 0.0–3.0)
Eosinophils Absolute: 0.1 10*3/uL (ref 0.0–0.7)
Eosinophils Relative: 0.7 % (ref 0.0–5.0)
HCT: 45.2 % (ref 36.0–46.0)
Hemoglobin: 15.2 g/dL — ABNORMAL HIGH (ref 12.0–15.0)
Lymphocytes Relative: 24.3 % (ref 12.0–46.0)
Lymphs Abs: 2.2 10*3/uL (ref 0.7–4.0)
MCHC: 33.7 g/dL (ref 30.0–36.0)
MCV: 94.7 fl (ref 78.0–100.0)
Monocytes Absolute: 0.9 10*3/uL (ref 0.1–1.0)
Monocytes Relative: 10.2 % (ref 3.0–12.0)
Neutro Abs: 5.6 10*3/uL (ref 1.4–7.7)
Neutrophils Relative %: 63.8 % (ref 43.0–77.0)
Platelets: 278 10*3/uL (ref 150.0–400.0)
RBC: 4.77 Mil/uL (ref 3.87–5.11)
RDW: 17 % — ABNORMAL HIGH (ref 11.5–15.5)
WBC: 8.9 10*3/uL (ref 4.0–10.5)

## 2022-07-30 LAB — COMPREHENSIVE METABOLIC PANEL
ALT: 14 U/L (ref 0–35)
AST: 28 U/L (ref 0–37)
Albumin: 2.9 g/dL — ABNORMAL LOW (ref 3.5–5.2)
Alkaline Phosphatase: 81 U/L (ref 39–117)
BUN: 9 mg/dL (ref 6–23)
CO2: 24 mEq/L (ref 19–32)
Calcium: 8.7 mg/dL (ref 8.4–10.5)
Chloride: 94 mEq/L — ABNORMAL LOW (ref 96–112)
Creatinine, Ser: 0.9 mg/dL (ref 0.40–1.20)
GFR: 62.49 mL/min (ref >60.00)
Glucose, Bld: 90 mg/dL (ref 70–99)
Potassium: 3.1 mEq/L — ABNORMAL LOW (ref 3.5–5.1)
Sodium: 135 mEq/L (ref 135–145)
Total Bilirubin: 0.6 mg/dL (ref 0.2–1.2)
Total Protein: 6.3 g/dL (ref 6.0–8.3)

## 2022-07-30 MED ORDER — POTASSIUM CHLORIDE CRYS ER 20 MEQ PO TBCR: 20.0000 meq | EXTENDED_RELEASE_TABLET | Freq: Two times a day (BID) | ORAL | 0 refills | Status: DC

## 2022-07-30 NOTE — Progress Notes (Signed)
Subjective:    Patient ID: Donna Bailey, female    DOB: 05-23-1946, 76 y.o.   MRN: [id number]  HPI Donna "Carney Bern" is a 76 year old white female, established with Dr. Marina Goodell who was last seen in the office in June 2023 by Willette Cluster, NP.  She has history of chronic abdominal pain and has had chronic intermittent nausea and vomiting off and on for years. He has multiple comorbidities including coronary artery disease status post anterior MI with drug-eluting stent LAD 2019, on Brilinta, hypertension, peripheral arterial disease, ischemic cardiomyopathy with most recent echo 2022 with EF 60 to 65% and no AAS, GERD, history of Barrett's, pancreatic atrophy, chronic kidney disease stage III. She comes in today after an ER visit on 07/24/2022 with complaints of weakness in setting of intermittent nausea and vomiting.  Apparently she had recently been treated for a UTI.  Labs were done at that time showing a WBC of 11.4/hemoglobin 15.2/potassium 3.3 BUN 14/creatinine 1.51 BNP 73 UA was positive but urine culture negative. She had CT of the abdomen and pelvis without IV contrast which showed a normal-appearing gallbladder, moderate hiatal hernia diverticulosis, no acute findings in the abdomen but did note to have multiple new compression fractures from T10-L4 compared to prior imaging.  She last had EGD June 2022 with finding of a 1.5 cm segment of Barrett's, moderate hiatal hernia and fundic gland polyps.  Biopsy showed Barrett's without dysplasia. Colonoscopy at that same setting done for history of adenomatous polyps with 2 small polyps removed each 2 to 3 mm in size and had an isolated cecal diverticulum.  Path on the polyps consistent with hyperplastic polyps biopsies negative for microscopic colitis.  Patient and husband both had a very difficult time getting into the office today, has been short of breath and walking with a walker, patient had to come in in a wheelchair from the waiting  room.  She says she has been feeling poorly for weeks, is unable to eat much at all and has had intermittent vomiting.  She is complaining of ongoing back pain says she does not have any pain medication currently and is unable to have kyphoplasty done because she has an allergy to one of the ingredients.  She has been taking her PPI/Nexium twice daily. Her history is vague but she says she really has not been eating much at all since Thanksgiving.  It sounds as if she and her husband are having a difficult time taking care of themselves at home, do not have any meal services lined up etc.  She went on extensively today about her horrible stay in the emergency room waiting for 11 hours to be seen and then being discharged home.  She denies any diarrhea melena or hematochezia today.  She is adamant that she wants to have another endoscopy. Uncertain about weight loss, and was unable to stand to be weighed today  She relates that she has spoken to her PCP Dr. Tenny Craw trying to get home health services for herself like her husband has but was told that she did not need them.  She also says she would like to go on hospice.  Review of Systems Pertinent positive and negative review of systems were noted in the above HPI section.  All other review of systems was otherwise negative.   Outpatient Encounter Medications as of 07/30/2022  Medication Sig   amitriptyline (ELAVIL) 25 MG tablet Take 1 tablet by mouth as needed.   carvedilol (COREG) 3.125 MG  tablet Take 1 tablet (3.125 mg total) by mouth 2 (two) times daily.   cyanocobalamin (,VITAMIN B-12,) 1000 MCG/ML injection 1,000 mcg every 30 (thirty) days.   Digestive Enzyme CAPS Take 2 capsules by mouth daily as needed (with large meals).   diphenhydrAMINE (BENADRYL) 25 mg capsule Take 25 mg by mouth daily as needed for sleep or itching.   esomeprazole (NEXIUM) 40 MG capsule TAKE 1 CAPSULE BY MOUTH TWICE A DAY   famotidine (PEPCID) 40 MG tablet Take 40 mg by  mouth at bedtime.   furosemide (LASIX) 40 MG tablet Take 1.5 tablets (60 mg total) by mouth daily.   Gabapentin 10 % CREA Apply 1-2 application. topically at bedtime. Feet   guaiFENesin-codeine 100-10 MG/5ML syrup Take 2.5-5 mLs by mouth daily as needed (Cold).   hydrALAZINE (APRESOLINE) 25 MG tablet Take 1 tablet (25 mg total) by mouth daily as needed (for BP greater than 160 systolic).   HYDROcodone bit-homatropine (HYDROMET) 5-1.5 MG/5ML syrup Take 5 mLs by mouth every 6 (six) hours as needed for cough.   lidocaine (LIDODERM) 5 % Place 0.5 patches onto the skin daily as needed (pain).   MAGNESIUM PO Take 300 mg by mouth every evening.   Multiple Vitamins-Minerals (MULTIVITAMIN WITH MINERALS) tablet Take 1 tablet by mouth daily.   NITRO-BID 2 % ointment Apply 1 g topically daily. Finger tips   nitroGLYCERIN (NITROSTAT) 0.4 MG SL tablet Place 1 tablet (0.4 mg total) under the tongue every 5 (five) minutes as needed for chest pain.   ondansetron (ZOFRAN) 4 MG tablet Take 1 tablet (4 mg total) by mouth every 8 (eight) hours as needed for nausea or vomiting.   ondansetron (ZOFRAN-ODT) 4 MG disintegrating tablet Take 1 tablet (4 mg total) by mouth every 6 (six) hours as needed for nausea or vomiting.   oxyCODONE (OXY IR/ROXICODONE) 5 MG immediate release tablet Take 1-2 tablets (5-10 mg total) by mouth every 6 (six) hours as needed for moderate pain or severe pain.   Probiotic Product (PROBIOTIC DAILY PO) Take 1 tablet by mouth daily.   promethazine-codeine (PHENERGAN WITH CODEINE) 6.25-10 MG/5ML syrup Take 5 mLs by mouth every 6 (six) hours as needed for cough.   ticagrelor (BRILINTA) 90 MG TABS tablet Take 1 tablet (90 mg total) by mouth 2 (two) times daily.   tiZANidine (ZANAFLEX) 2 MG tablet Take 2 mg by mouth 2 (two) times daily as needed.   zolpidem (AMBIEN) 10 MG tablet Take 10 mg by mouth at bedtime as needed for sleep.   No facility-administered encounter medications on file as of  07/30/2022.   Allergies  Allergen Reactions   Cephalosporins Itching    Other reaction(s): Other (See Comments) Unknown Tolerated cefdinir in 2019   Crestor [Rosuvastatin] Other (See Comments)    Lost all muscle mobility    Doxycycline Diarrhea and Nausea And Vomiting    Other reaction(s): Other (See Comments)   Formoterol Other (See Comments)    Reaction not recalled   Other Anaphylaxis, Itching, Rash and Other (See Comments)    Corn fillers, corn by-products - causes severe itching Polyester-"pins sticking in her skin    Sulfa Antibiotics Anaphylaxis   Sulfonamide Derivatives Anaphylaxis   Ciprofloxacin Other (See Comments) and Rash   Nitrofurantoin Diarrhea, Nausea And Vomiting and Other (See Comments)    Also, "convulsions/constant shaking"- per patient   Penicillins Rash    Underarms (both) Has patient had a PCN reaction causing immediate rash, facial/tongue/throat swelling, SOB or lightheadedness with hypotension:  YES Has patient had a PCN reaction causing severe rash involving mucus membranes or skin necrosis: NO Has patient had a PCN reaction that required hospitalization NO Has patient had a PCN reaction occurring within the last 10 years: NO If all of the above answers are "NO", then may proceed with Cephalosporin use. Other reaction(s): Other (See Comments) Underarms (both) Other Reaction: "red hot skin" Underarms (both) Has patient had a PCN reaction causing immediate rash, facial/tongue/throat swelling, SOB or lightheadedness with hypotension: YES Has patient had a PCN reaction causing severe rash involving mucus membranes or skin necrosis: NO Has patient had a PCN reaction that required hospitalization NO Has patient had a PCN reaction occurring within the last 10 years: NO If all of the above answers are "NO", then may proceed with Cephalosporin use.   Prednisone Itching    Pt states she cannot take prednisone with corn filler 05/19/2019.   Amlodipine      Swelling in legs   Caffeine Other (See Comments)    Heart race  Other reaction(s): Other (See Comments)   Carvedilol     dry mouth   Cephalexin Other (See Comments)    Reaction not recalled by the patient   Dilt-Xr [Diltiazem Hcl]     LE edema   Esomeprazole     Other reaction(s): Unknown   Formoterol Fumarate Other (See Comments)    Reaction not recalled   Fosfomycin     Nerve pain in her legs   Gold Sodium Thiosulfate Other (See Comments)    Positive patch test   Gold-Containing Drug Products Other (See Comments)    Skin tingles   Hydralazine    Levofloxacin In D5w Other (See Comments)    Other Reaction: racing heart Other reaction(s): Other (See Comments) Other Reaction: racing heart   Macrodantin [Nitrofurantoin Macrocrystal] Itching   Moxifloxacin Other (See Comments)    Caused hands to "shake"   Neomycin Other (See Comments)    Doesn't remember - allergist said not to use it because it could add more allergies- Positive patch test    Ofloxacin Itching and Other (See Comments)   Praluent [Alirocumab]     shaking   Pregabalin Other (See Comments)   Ranexa [Ranolazine Er]     itching   Repatha [Evolocumab]     shaking   Valsartan Other (See Comments)    Pt reports this med causes her to feel sluggish and she feels heaviness on her shoulders.   Welchol [Colesevelam]    Zetia [Ezetimibe]    Zolpidem     Other reaction(s): Unknown   Zolpidem Tartrate Other (See Comments)   Acetaminophen Other (See Comments) and Rash    Facial rash   Fexofenadine Palpitations and Other (See Comments)    Heart races   Ibuprofen Rash   Latex Rash and Other (See Comments)    Skin gets red    Levofloxacin Other (See Comments) and Palpitations    HEART RACING   Mold Extract [Trichophyton Mentagrophyte] Other (See Comments)    Bumps on back, stops up sinuses.   Molds & Smuts Rash and Other (See Comments)    Bumps on back, stops up sinuses.   Nickel Rash   Tape Rash    Trichophyton Rash and Other (See Comments)    Bumps on back, stops up sinuses   Patient Active Problem List   Diagnosis Date Noted   History of vertebral fracture 06/10/2022   Myalgia due to statin 06/04/2022   Falls 02/24/2022  Lumbar spondylosis 02/24/2022   PAD (peripheral artery disease) (HCC) 11/25/2021   Pain of left hand 10/30/2021   Bilateral lower extremity edema 09/25/2021   Acute bronchospasm 09/02/2021   Acute stress disorder 09/02/2021   Amnesia 09/02/2021   Atrophic vaginitis 09/02/2021   Candidiasis 09/02/2021   Chronic kidney disease, stage 3a (HCC) 09/02/2021   Dysphagia 09/02/2021   Hereditary and idiopathic neuropathy, unspecified 09/02/2021   History of migraine 09/02/2021   History of multiple allergies 09/02/2021   Hypotension 09/02/2021   Labile blood pressure 09/02/2021   Mild intermittent asthma 09/02/2021   Other long term (current) drug therapy 09/02/2021   Other specified disorders of bone density and structure, other site 09/02/2021   Overactive bladder 09/02/2021   Pancreatic insufficiency 09/02/2021   Pernicious anemia 09/02/2021   Recurrent cystitis 09/02/2021   Recurrent falls 09/02/2021   Rosacea 09/02/2021   Tinnitus of left ear 09/02/2021   Tremor 09/02/2021   Unspecified abnormal finding in specimens from other organs, systems and tissues 09/02/2021   Upset stomach 09/02/2021   Vitamin B12 deficiency 09/02/2021   Vitamin D deficiency 09/02/2021   Dizziness 08/23/2021   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   Chest pain 05/23/2021   Essential hypertension 05/23/2021   AKI (acute kidney injury) (HCC) 05/23/2021   Other fatigue 05/22/2021   Incontinence of feces 05/15/2021   Pelvic floor weakness 05/15/2021   Snores 05/02/2021   Abdominal pain, epigastric 11/28/2020   Change in bowel habits 11/28/2020   Chronic venous insufficiency 10/23/2020   Acute recurrent pansinusitis 08/02/2019   Dental infection 08/02/2019   Dry skin  07/07/2019   Allergic reaction 06/09/2019   Chronic rhinitis 06/09/2019   Adverse food reaction 06/09/2019   Multiple drug allergies 06/09/2019   Pain in right foot 05/25/2019   Right arm pain 04/25/2019   Sore in mouth 04/01/2019   Contusion of right knee 12/23/2018   Close exposure to COVID-19 virus 11/05/2018   Pain of left heel 09/10/2018   Pain in left knee 08/18/2018   Bronchitis, acute 04/15/2018   Upper respiratory infection, acute 04/15/2018   Impingement syndrome of right shoulder region 02/01/2018   Coronary artery disease involving native coronary artery with angina pectoris (HCC) 01/22/2018   Hematoma of arm, right, sequela 01/05/2018   Ischemic cardiomyopathy 01/05/2018   Migraine 11/13/2017   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 11/13/2017   Post-traumatic arthritis of ankle, right 12/22/2016   Biceps tendinopathy, right 11/09/2016   Subacromial impingement, right 11/09/2016   Partial nontraumatic tear of rotator cuff, right 11/09/2016   Pelvic organ prolapse quantification stage 3 cystocele 11/26/2015   Cerebral aneurysm 10/31/2015   Obesity (BMI 30-39.9)    Erosive esophagitis 02/23/2013   Hyperlipidemia LDL goal <70 08/26/2011   Tinea 08/26/2011   Food intolerance 03/25/2011   Personal history of colonic polyps 05/15/2010   Asthma with bronchitis 03/28/2010   Seasonal and perennial allergic rhinitis 11/29/2007   TMJ SYNDROME 11/29/2007   GERD 11/29/2007   HIATAL HERNIA 11/29/2007   DEGENERATIVE JOINT DISEASE 11/29/2007   Fibromyalgia 11/29/2007   SCOLIOSIS 11/29/2007   Insomnia 11/29/2007   Myalgia and myositis 11/29/2007   Barrett esophagus    Social History   Socioeconomic History   Marital status: Married    Spouse name: Not on file   Number of children: 0   Years of education: Not on file   Highest education level: Not on file  Occupational History   Occupation: retired  Employer: RETIRED  Tobacco Use   Smoking status: Never     Passive exposure: Never   Smokeless tobacco: Never  Vaping Use   Vaping Use: Never used  Substance and Sexual Activity   Alcohol use: No    Alcohol/week: 0.0 standard drinks of alcohol   Drug use: No   Sexual activity: Never  Other Topics Concern   Not on file  Social History Narrative   Lives with husband in a 2 story home.  Has no children.  Retired Warden/ranger.  Education: college. Right handed    Social Determinants of Health   Financial Resource Strain: Not on file  Food Insecurity: Not on file  Transportation Needs: Not on file  Physical Activity: Not on file  Stress: Not on file  Social Connections: Not on file  Intimate Partner Violence: Not on file    Ms. Thull's family history includes Breast cancer in her sister; Cancer in her sister; Cancer (age of onset: 66) in her sister; Colitis in her mother; Colon polyps in her brother; Diverticulosis in her mother; Heart disease in her father and mother; Leukemia in her sister; Tuberculosis in her brother.      Objective:    Vitals:   07/30/22 0957  BP: 120/80  Pulse: 68    Physical Exam Well-developed chronically ill-appearing elderly white female accompanied by her husband who is ambulating with a walker and not feeling well.  Patient in no acute distress.  Height, Weight, 145 BMI  HEENT; nontraumatic normocephalic, EOMI, PE R LA, sclera anicteric. Oropharynx; mucosa moist, no thrush Neck; supple, no JVD Cardiovascular; regular rate and rhythm with S1-S2, no murmur rub or gallop Pulmonary; Clear bilaterally Abdomen; soft, nontender, nondistended, no palpable mass or hepatosplenomegaly, bowel sounds are active Rectal; not done today Skin; benign exam, no jaundice rash or appreciable lesions Extremities; no clubbing cyanosis or edema skin warm and dry Neuro/Psych; alert and oriented x4, grossly nonfocal mood and affect appropriate        Assessment & Plan:   #19 76 year old white female with multiple  comorbidities who has history of chronic abdominal pain and intermittent nausea and vomiting over the past several years who comes in feeling ill over the past month with poor appetite, and inability to eat much.  She complains of weakness, no significant abdominal pain, no hematemesis, no melena or hematochezia.  She was just seen in the ER 5 days ago and had CT scan which was unremarkable noncontrasted other than multiple new compression fractures T10-L4 Labs at that time unremarkable other than potassium of 3.3 and creatinine of 1.5  Etiology of her nausea and inability to eat likely multifactoral, and her history is vague. Doubt peptic ulcer disease or significant gastropathy as she is already on chronic twice daily PPI.  #2 chronic antiplatelet therapy on Brilinta 3.  Coronary artery disease status post MI with drug-eluting stent LAD 2019 4.  Peripheral arterial disease 5.  Ischemic cardiomyopathy most recent EF 60 to 65% 6.  Chronic kidney disease stage III 7.  Chronic GERD 8.  Barrett's esophagus up-to-date with surveillance no dysplasia June 2022 9.  History of adenomatous colon polyps-up-to-date with colonoscopy done 2022 with 2 hyperplastic polyps  #10 hypokalemia # 11  Debilitation-I think she and her husband are both approaching inability to care for themselves at home  Plan; patient currently refusing hospitalization and declining to go to the emergency room She would like to be admitted directly and have an endoscopy explained was not  possible at this time, as both of the hospitals are quite full and admission will require her going through the emergency room , Will repeat CBC with differential and c-Met today Continue Nexium 40 mg p.o. twice daily Refill Zofran and ODT 4 mg every 6 hours as needed for nausea. Patient was encouraged to push fluids, and increase protein intake.  We discussed drinking protein shakes 2-3 times daily in between meals, she asks if she could have a  prescription for a protein shake, I tried to send prescription for resource [employer] however her pharmacy does not carry this.  She has been using Ensure on a as needed basis.  Patient was advised that if she cannot keep down any p.o.'s over the next 48 hours she will need to go to the emergency room and may find the waiting time to be less at [facility] long.  EGD has been scheduled with Dr. Marina Goodell in the [facility], will need to hold Brilinta for 5 days prior to EGD and will need to communicate with her cardiologist to sure this is reasonable for this patient.  I honestly think EGD will be low yield.  And is advised to reach out to her PCP Dr. Tenny Craw regarding getting set up for some home health care visits, and to discuss her desire to be placed on hospice eventually. I think both she and her husband are close to the point that they are not going to be able to care for themselves in their home.     Calisha Tindel Oswald Hillock PA-C 07/30/2022   Cc: Daisy Floro, MD

## 2022-07-30 NOTE — Patient Instructions (Addendum)
_______________________________________________________  If you are age 75 or older, your body mass index should be between 23-30. Your There is no height or weight on file to calculate BMI. If this is out of the aforementioned range listed, please consider follow up with your Primary Care Provider.  If you are age 57 or younger, your body mass index should be between 19-25. Your There is no height or weight on file to calculate BMI. If this is out of the aformentioned range listed, please consider follow up with your Primary Care Provider.   ________________________________________________________  The [facility] providers would like to encourage you to use [facility] to communicate with providers for non-urgent requests or questions.  Due to long hold times on the telephone, sending your provider a message by [facility] may be a faster and more efficient way to get a response.  Please allow 48 business hours for a response.  Please remember that this is for non-urgent requests.  _______________________________________________________   Your provider has requested that you go to the basement level for lab work before leaving today. Press "B" on the elevator. The lab is located at the first door on the left as you exit the elevator. We got a lab tech to come to you.  [facility] and [facility] said they will bring it to you after 3pm today.  Please get [employer] 2 times a day. Please get this over the counter  Go to the ER if you can't keep anything down in the next 2 days  You have been scheduled for an endoscopy. Please follow written instructions given to you at your visit today. If you use inhalers (even only as needed), please bring them with you on the day of your procedure.  You have been scheduled for an endoscopy. Please follow written instructions given to you at your visit today. If you use inhalers (even only as needed), please bring them with you on  the day of your procedure.  You will be contacted by our office prior to your procedure for directions on holding your blood thinner.  If you do not hear from our office 2 weeks prior to your scheduled procedure, please call [phone] to discuss.    It was a pleasure to see you today!  Thank you for trusting me with your gastrointestinal care!

## 2022-07-30 NOTE — Telephone Encounter (Signed)
Dear Laurence Slate PA-C,  I am reaching out to you regarding this patient. You've prescribed her Brilinta last and I was wondering if you will clear this patient to hold her Brilinta 5 days prior to and EGD scheduled with Dr Henrene Pastor on 09-01-2022. Please let me know as soon as you can. Thank you for your time   [facility] Pre-operative Risk Assessment     Request for surgical clearance:     Endoscopy Procedure  What type of surgery is being performed?     EGD  When is this surgery scheduled?     09-01-2022  What type of clearance is required ?   Pharmacy  Are there any medications that need to be held prior to surgery and how long? Brilinta 5 day hold  Practice name and name of physician performing surgery?      [facility]  What is your office phone and fax number?      Phone- [phone]  [phone]  Anesthesia type (None, local, MAC, general) ?       MAC

## 2022-07-31 NOTE — Progress Notes (Signed)
Assessment and plan noted ?

## 2022-08-05 ENCOUNTER — Telehealth: Payer: Self-pay | Admitting: Internal Medicine

## 2022-08-05 ENCOUNTER — Other Ambulatory Visit: Payer: Self-pay | Admitting: Internal Medicine

## 2022-08-05 NOTE — Telephone Encounter (Signed)
Pt is calling about pain and the fact that she can't keep anything down. She was hospitalized before Christmas and is still having issues. Please advise

## 2022-08-05 NOTE — Telephone Encounter (Signed)
Patient is calling back to see if she can get scheduled for her EGD. Patient will not be in her home until later in the morning.

## 2022-08-05 NOTE — Telephone Encounter (Signed)
Please advise on refill request  Allergies  Allergen Reactions   Cephalosporins Itching    Other reaction(s): Other (See Comments) Unknown Tolerated cefdinir in 2019   Crestor [Rosuvastatin] Other (See Comments)    Lost all muscle mobility    Doxycycline Diarrhea and Nausea And Vomiting    Other reaction(s): Other (See Comments)   Formoterol Other (See Comments)    Reaction not recalled   Other Anaphylaxis, Itching, Rash and Other (See Comments)    Corn fillers, corn by-products - causes severe itching Polyester-"pins sticking in her skin    Sulfa Antibiotics Anaphylaxis   Sulfonamide Derivatives Anaphylaxis   Ciprofloxacin Other (See Comments) and Rash   Nitrofurantoin Diarrhea, Nausea And Vomiting and Other (See Comments)    Also, "convulsions/constant shaking"- per patient   Penicillins Rash    Underarms (both) Has patient had a PCN reaction causing immediate rash, facial/tongue/throat swelling, SOB or lightheadedness with hypotension: YES Has patient had a PCN reaction causing severe rash involving mucus membranes or skin necrosis: NO Has patient had a PCN reaction that required hospitalization NO Has patient had a PCN reaction occurring within the last 10 years: NO If all of the above answers are "NO", then may proceed with Cephalosporin use. Other reaction(s): Other (See Comments) Underarms (both) Other Reaction: "red hot skin" Underarms (both) Has patient had a PCN reaction causing immediate rash, facial/tongue/throat swelling, SOB or lightheadedness with hypotension: YES Has patient had a PCN reaction causing severe rash involving mucus membranes or skin necrosis: NO Has patient had a PCN reaction that required hospitalization NO Has patient had a PCN reaction occurring within the last 10 years: NO If all of the above answers are "NO", then may proceed with Cephalosporin use.   Prednisone Itching    Pt states she cannot take prednisone with corn filler 05/19/2019.    Amlodipine     Swelling in legs   Caffeine Other (See Comments)    Heart race  Other reaction(s): Other (See Comments)   Carvedilol     dry mouth   Cephalexin Other (See Comments)    Reaction not recalled by the patient   Dilt-Xr [Diltiazem Hcl]     LE edema   Esomeprazole     Other reaction(s): Unknown   Formoterol Fumarate Other (See Comments)    Reaction not recalled   Fosfomycin     Nerve pain in her legs   Gold Sodium Thiosulfate Other (See Comments)    Positive patch test   Gold-Containing Drug Products Other (See Comments)    Skin tingles   Hydralazine    Levofloxacin In D5w Other (See Comments)    Other Reaction: racing heart Other reaction(s): Other (See Comments) Other Reaction: racing heart   Macrodantin [Nitrofurantoin Macrocrystal] Itching   Moxifloxacin Other (See Comments)    Caused hands to "shake"   Neomycin Other (See Comments)    Doesn't remember - allergist said not to use it because it could add more allergies- Positive patch test    Ofloxacin Itching and Other (See Comments)   Praluent [Alirocumab]     shaking   Pregabalin Other (See Comments)   Ranexa [Ranolazine Er]     itching   Repatha [Evolocumab]     shaking   Valsartan Other (See Comments)    Pt reports this med causes her to feel sluggish and she feels heaviness on her shoulders.   Welchol [Colesevelam]    Zetia [Ezetimibe]    Zolpidem     Other reaction(s):  Unknown   Zolpidem Tartrate Other (See Comments)   Acetaminophen Other (See Comments) and Rash    Facial rash   Fexofenadine Palpitations and Other (See Comments)    Heart races   Ibuprofen Rash   Latex Rash and Other (See Comments)    Skin gets red    Levofloxacin Other (See Comments) and Palpitations    HEART RACING   Mold Extract [Trichophyton Mentagrophyte] Other (See Comments)    Bumps on back, stops up sinuses.   Molds & Smuts Rash and Other (See Comments)    Bumps on back, stops up sinuses.   Nickel Rash   Tape  Rash   Trichophyton Rash and Other (See Comments)    Bumps on back, stops up sinuses    Current Outpatient Medications:    potassium chloride SA (KLOR-CON M) 20 MEQ tablet, Take 1 tablet (20 mEq total) by mouth 2 (two) times daily for 5 days., Disp: 10 tablet, Rfl: 0   amitriptyline (ELAVIL) 25 MG tablet, Take 1 tablet by mouth as needed., Disp: , Rfl:    carvedilol (COREG) 3.125 MG tablet, Take 1 tablet (3.125 mg total) by mouth 2 (two) times daily., Disp: 180 tablet, Rfl: 3   cyanocobalamin (,VITAMIN B-12,) 1000 MCG/ML injection, 1,000 mcg every 30 (thirty) days., Disp: , Rfl:    Digestive Enzyme CAPS, Take 2 capsules by mouth daily as needed (with large meals)., Disp: , Rfl:    diphenhydrAMINE (BENADRYL) 25 mg capsule, Take 25 mg by mouth daily as needed for sleep or itching., Disp: , Rfl:    esomeprazole (NEXIUM) 40 MG capsule, TAKE 1 CAPSULE BY MOUTH TWICE A DAY, Disp: 180 capsule, Rfl: 1   famotidine (PEPCID) 40 MG tablet, Take 40 mg by mouth at bedtime., Disp: , Rfl:    furosemide (LASIX) 40 MG tablet, Take 1.5 tablets (60 mg total) by mouth daily., Disp: 135 tablet, Rfl: 3   Gabapentin 10 % CREA, Apply 1-2 application. topically at bedtime. Feet, Disp: , Rfl:    guaiFENesin-codeine 100-10 MG/5ML syrup, Take 2.5-5 mLs by mouth daily as needed (Cold)., Disp: 120 mL, Rfl: 0   hydrALAZINE (APRESOLINE) 25 MG tablet, Take 1 tablet (25 mg total) by mouth daily as needed (for BP greater than 127 systolic)., Disp: 30 tablet, Rfl: 3   HYDROcodone bit-homatropine (HYDROMET) 5-1.5 MG/5ML syrup, Take 5 mLs by mouth every 6 (six) hours as needed for cough., Disp: 120 mL, Rfl: 0   lidocaine (LIDODERM) 5 %, Place 0.5 patches onto the skin daily as needed (pain)., Disp: , Rfl:    MAGNESIUM PO, Take 300 mg by mouth every evening., Disp: , Rfl:    Multiple Vitamins-Minerals (MULTIVITAMIN WITH MINERALS) tablet, Take 1 tablet by mouth daily., Disp: , Rfl:    NITRO-BID 2 % ointment, Apply 1 g topically  daily. Finger tips, Disp: , Rfl:    nitroGLYCERIN (NITROSTAT) 0.4 MG SL tablet, Place 1 tablet (0.4 mg total) under the tongue every 5 (five) minutes as needed for chest pain., Disp: 25 tablet, Rfl: 2   ondansetron (ZOFRAN) 4 MG tablet, Take 1 tablet (4 mg total) by mouth every 8 (eight) hours as needed for nausea or vomiting., Disp: 60 tablet, Rfl: 1   ondansetron (ZOFRAN-ODT) 4 MG disintegrating tablet, Take 1 tablet (4 mg total) by mouth every 6 (six) hours as needed for nausea or vomiting., Disp: 30 tablet, Rfl: 1   oxyCODONE (OXY IR/ROXICODONE) 5 MG immediate release tablet, Take 1-2 tablets (5-10 mg total) by  mouth every 6 (six) hours as needed for moderate pain or severe pain., Disp: 30 tablet, Rfl: 0   Probiotic Product (PROBIOTIC DAILY PO), Take 1 tablet by mouth daily., Disp: , Rfl:    promethazine-codeine (PHENERGAN WITH CODEINE) 6.25-10 MG/5ML syrup, Take 5 mLs by mouth every 6 (six) hours as needed for cough., Disp: 200 mL, Rfl: 0   ticagrelor (BRILINTA) 90 MG TABS tablet, Take 1 tablet (90 mg total) by mouth 2 (two) times daily., Disp: 60 tablet, Rfl: 3   tiZANidine (ZANAFLEX) 2 MG tablet, Take 2 mg by mouth 2 (two) times daily as needed., Disp: , Rfl:    zolpidem (AMBIEN) 10 MG tablet, Take 10 mg by mouth at bedtime as needed for sleep., Disp: , Rfl:

## 2022-08-05 NOTE — Telephone Encounter (Signed)
Thanks so much for your help. Let me know as soon as you hear something.

## 2022-08-05 NOTE — Telephone Encounter (Signed)
Donna Bailey this pt is calling back wanting to have EGD done sooner than scheduled appt. Have you heard back on holding her Brilinta?

## 2022-08-05 NOTE — Telephone Encounter (Signed)
Hydromet refilled

## 2022-08-05 NOTE — Telephone Encounter (Signed)
No I haven't. As you can see the PA hasn't responded. I will make it high priority and resent it to her. It says it is in her box still

## 2022-08-05 NOTE — Telephone Encounter (Signed)
Pt calling wanting to have EGD done sooner. Brilinta hold not approved yet. Checking on the status of this.

## 2022-08-06 NOTE — Telephone Encounter (Signed)
Spoke with pt and she is aware of Dr. Blanch Media recommendations. Pt states she thinks her husband is having a stroke, they have called 37 and her son. She states they both can't be in the hospital. She will call back if she can't make her OV that is scheduled.

## 2022-08-06 NOTE — Telephone Encounter (Signed)
Donna Bailey, Donna Bailey was just evaluated in our office less than 1 week ago.  It was not clear at that time that her primary problem was gastrointestinal in nature, but rather her symptom complex was multifactorial-mostly non-GI.  Nothing additional to add from that recent evaluation.  I agree that, despite the inconvenience, if she is that ill, then best to be evaluated in the emergency room with the prospects for admission and accelerated workup. Dr.  Henrene Pastor

## 2022-08-06 NOTE — Telephone Encounter (Signed)
Pt called answering service this am. Called pt back and she states she has not been able to eat/drink/ take her medications. Discussed with her if she is unable to keep meds and food/liquids down she needed to go to the ER. Pt states she did that 2 weeks ago and was sent home. She states she cannot wait that long in the ER. Discussed with her she could try [facility] or [facility]. Pt states she may do this but concerned that she may have to be transferred to the other hospital and start the wail all over again. Discussed with her if they think she needs to have a procedure she should not have to start the wait over again. Pt unsure of what she wants to do, states she may just stay at home and die. [name] notified.

## 2022-08-08 ENCOUNTER — Telehealth: Payer: Self-pay | Admitting: *Deleted

## 2022-08-08 NOTE — Telephone Encounter (Signed)
   Pre-operative Risk Assessment    Patient Name: Donna Bailey  DOB: August 17, 1945 MRN: [id number]      Request for Surgical Clearance    Procedure:   EGD  Date of Surgery:  Clearance 09/01/22                                 Surgeon:  DR. Scarlette Shorts Surgeon's Group or Practice Name:  [facility] Phone number:  [phone] Fax number:  [phone]   Type of Clearance Requested:   - Medical  - Pharmacy:  Hold Ticagrelor (Brilinta) x 5 DAYS PRIOR   Type of Anesthesia:  Not Indicated; (PROPOFOL?)   Additional requests/questions:    Donna Bailey   08/08/2022, 5:08 PM

## 2022-08-08 NOTE — Telephone Encounter (Signed)
Hi Dr Johney Frame,   I am wondering if you are willing to allow this patient to hold her Brilinta 5 days prior to her upcoming EGD that is scheduled on 09-01-2022. Please let me know. Thank you for your time.

## 2022-08-11 ENCOUNTER — Telehealth: Payer: Self-pay | Admitting: Physician Assistant

## 2022-08-11 NOTE — Telephone Encounter (Signed)
Patient is at high risk for myocardial infarction if not taking an antiplatelet drug. Can she take ASA? If so, start ASA 81 mg once daily. If not, I would try Brilinta again (take with food). Richardson Dopp, PA-C    08/11/2022 5:56 PM

## 2022-08-11 NOTE — Telephone Encounter (Signed)
New Message:     Donna Bailey from Dr Alan Ripper office called. She wanted Scott to know that patient have stopped taking her medicine.    Pt c/o medication issue:  1. Name of Medication: Brilinta  2. How are you currently taking this medication (dosage and times per day)?   3. Are you having a reaction (difficulty breathing--STAT)?   4. What is your medication issue? She stopped taking this medicine this summer- because it was making her sick. She was throwing it back up when she took it

## 2022-08-12 ENCOUNTER — Ambulatory Visit (INDEPENDENT_AMBULATORY_CARE_PROVIDER_SITE_OTHER): Payer: Medicare Other | Admitting: Internal Medicine

## 2022-08-12 ENCOUNTER — Encounter: Payer: Self-pay | Admitting: Internal Medicine

## 2022-08-12 VITALS — HR 94 | Ht 65.0 in | Wt 130.0 lb

## 2022-08-12 DIAGNOSIS — K219 Gastro-esophageal reflux disease without esophagitis: Secondary | ICD-10-CM | POA: Diagnosis not present

## 2022-08-12 DIAGNOSIS — Z7902 Long term (current) use of antithrombotics/antiplatelets: Secondary | ICD-10-CM | POA: Diagnosis not present

## 2022-08-12 DIAGNOSIS — R112 Nausea with vomiting, unspecified: Secondary | ICD-10-CM | POA: Diagnosis not present

## 2022-08-12 DIAGNOSIS — R634 Abnormal weight loss: Secondary | ICD-10-CM | POA: Diagnosis not present

## 2022-08-12 NOTE — Patient Instructions (Signed)
_______________________________________________________  If you are age 76 or older, your body mass index should be between 23-30. Your Body mass index is 21.63 kg/m. If this is out of the aforementioned range listed, please consider follow up with your Primary Care Provider.  If you are age 75 or younger, your body mass index should be between 19-25. Your Body mass index is 21.63 kg/m. If this is out of the aformentioned range listed, please consider follow up with your Primary Care Provider.   ________________________________________________________  The [facility] providers would like to encourage you to use [facility] to communicate with providers for non-urgent requests or questions.  Due to long hold times on the telephone, sending your provider a message by [facility] may be a faster and more efficient way to get a response.  Please allow 48 business hours for a response.  Please remember that this is for non-urgent requests.  _______________________________________________________  Donna Bailey have been scheduled for an endoscopy. Please follow written instructions given to you at your visit today. If you use inhalers (even only as needed), please bring them with you on the day of your procedure.

## 2022-08-12 NOTE — Telephone Encounter (Signed)
Spoke with pt who states she is scheduled for an endoscopy tomorrow and will not start back on any of her medications until after she has completed testing.  Pt made aware of how important it is that she start back on Brilinta as prescribed or ASA '81mg'$  as soon as possible.  Pt verbalizes understanding and states she will let our office know when she is able to restart antiplatelet therapy.

## 2022-08-12 NOTE — Progress Notes (Signed)
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  Service: Cardiovascular;  Laterality: N/A;   LEFT HEART CATH AND CORONARY ANGIOGRAPHY N/A 05/27/2021   Procedure: LEFT HEART CATH AND CORONARY ANGIOGRAPHY;  Surgeon: Nelva Bush, MD;  Location: [facility];  Service: Cardiovascular;  Laterality: N/A;   NASAL SEPTUM SURGERY     PERIPHERAL VASCULAR INTERVENTION  11/25/2021   Procedure: PERIPHERAL VASCULAR INTERVENTION;  Surgeon: Waynetta Sandy, MD;  Location: [facility];  Service: Cardiovascular;;  Left Subclavian   POLYPECTOMY     RADIOLOGY WITH ANESTHESIA N/A 04/07/2013   Procedure: ANEURYSM EMBOLIZATION ;  Surgeon: Rob Hickman, MD;  Location: [facility];  Service: Radiology;  Laterality: N/A;   right heel repair     SINOSCOPY     TEMPOROMANDIBULAR JOINT SURGERY     bilateral   TONSILLECTOMY     TYMPANOSTOMY TUBE PLACEMENT     UPPER EXTREMITY ANGIOGRAPHY N/A 11/25/2021   Procedure: Upper Extremity  Angiography;  Surgeon: Waynetta Sandy, MD;  Location: [facility];  Service: Cardiovascular;  Laterality: N/A;   UPPER GASTROINTESTINAL ENDOSCOPY     VAGINAL HYSTERECTOMY      Social History AIREONA TORELLI  reports that she has never smoked. She has never been exposed to tobacco smoke. She has never used smokeless tobacco. She reports that she does not drink alcohol and does not use drugs.  family history includes Breast cancer in her sister; Cancer in her sister; Cancer (age of onset: 5) in her sister; Colitis in her mother; Colon polyps in her brother; Diverticulosis in her mother; Heart disease in her father and mother; Leukemia in her sister; Tuberculosis in her brother.  Allergies  Allergen Reactions   Cephalosporins Itching    Other reaction(s): Other (See Comments) Unknown Tolerated cefdinir in 2019   Crestor [Rosuvastatin] Other (See Comments)    Lost all muscle mobility    Doxycycline Diarrhea and Nausea  And Vomiting    Other reaction(s): Other (See Comments)   Formoterol Other (See Comments)    Reaction not recalled   Other Anaphylaxis, Itching, Rash and Other (See Comments)    Corn fillers, corn by-products - causes severe itching Polyester-"pins sticking in her skin    Sulfa Antibiotics Anaphylaxis   Sulfonamide Derivatives Anaphylaxis   Ciprofloxacin Other (See Comments) and Rash   Nitrofurantoin Diarrhea, Nausea And Vomiting and Other (See Comments)    Also, "convulsions/constant shaking"- per patient   Penicillins Rash    Underarms (both) Has patient had a PCN reaction causing immediate rash, facial/tongue/throat swelling, SOB or lightheadedness with hypotension: YES Has patient had a PCN reaction causing severe rash involving mucus membranes or skin necrosis: NO Has patient had a PCN reaction that required hospitalization NO Has patient had a PCN reaction occurring within the last 10 years: NO If all of the above answers are "NO", then may  proceed with Cephalosporin use. Other reaction(s): Other (See Comments) Underarms (both) Other Reaction: "red hot skin" Underarms (both) Has patient had a PCN reaction causing immediate rash, facial/tongue/throat swelling, SOB or lightheadedness with hypotension: YES Has patient had a PCN reaction causing severe rash involving mucus membranes or skin necrosis: NO Has patient had a PCN reaction that required hospitalization NO Has patient had a PCN reaction occurring within the last 10 years: NO If all of the above answers are "NO", then may proceed with Cephalosporin use.   Prednisone Itching    Pt states she cannot take prednisone with corn filler 05/19/2019.   Amlodipine     Swelling in legs   Caffeine Other (See Comments)    Heart race  Other reaction(s): Other (See Comments)   Carvedilol     dry mouth   Cephalexin Other (See Comments)    Reaction not recalled by the patient   Dilt-Xr [Diltiazem Hcl]     LE edema   Esomeprazole     Other reaction(s): Unknown   Formoterol Fumarate Other (See Comments)    Reaction not recalled   Fosfomycin     Nerve pain in her legs   Gold Sodium Thiosulfate Other (See Comments)    Positive patch test   Gold-Containing Drug Products Other (See Comments)    Skin tingles   Hydralazine    Levofloxacin In D5w Other (See Comments)    Other Reaction: racing heart Other reaction(s): Other (See Comments) Other Reaction: racing heart   Macrodantin [Nitrofurantoin Macrocrystal] Itching   Moxifloxacin Other (See Comments)    Caused hands to "shake"   Neomycin Other (See Comments)    Doesn't remember - allergist said not to use it because it could add more allergies- Positive patch test    Ofloxacin Itching and Other (See Comments)   Praluent [Alirocumab]     shaking   Pregabalin Other (See Comments)   Ranexa [Ranolazine Er]     itching   Repatha [Evolocumab]     shaking   Valsartan Other (See Comments)    Pt reports this med causes her to  feel sluggish and she feels heaviness on her shoulders.   Welchol [Colesevelam]    Zetia [Ezetimibe]    Zolpidem     Other reaction(s): Unknown   Zolpidem Tartrate Other (See Comments)   Acetaminophen Other (See Comments) and Rash    Facial rash   Fexofenadine Palpitations and Other (See Comments)    Heart races   Ibuprofen Rash   Latex Rash and Other (See Comments)  Skin gets red    Levofloxacin Other (See Comments) and Palpitations    HEART RACING   Mold Extract [Trichophyton Mentagrophyte] Other (See Comments)    Bumps on back, stops up sinuses.   Molds & Smuts Rash and Other (See Comments)    Bumps on back, stops up sinuses.   Nickel Rash   Tape Rash   Trichophyton Rash and Other (See Comments)    Bumps on back, stops up sinuses       PHYSICAL EXAMINATION: Vital signs: Pulse 94   Ht '5\' 5"'$  (1.651 m)   Wt 130 lb (59 kg)   SpO2 97%   BMI 21.63 kg/m   Constitutional: Chronically ill-appearing, thin, no acute distress Psychiatric: alert and oriented x3, cooperative Eyes: extraocular movements intact, anicteric, conjunctiva pink Mouth: oral pharynx moist, no lesions Neck: supple no lymphadenopathy Cardiovascular: heart regular rate and rhythm, no murmur Lungs: clear to auscultation bilaterally Abdomen: soft, nontender, nondistended, no obvious ascites, no peritoneal signs, normal bowel sounds, no organomegaly Rectal: Omitted Extremities: no clubbing, cyanosis, or lower extremity edema bilaterally Skin: no lesions on visible extremities Neuro: No focal deficits.  Cranial nerves intact  ASSESSMENT:  1.  Anorexia.  Multifactorial 2.  Weight loss secondary anorexia 3.  Chronic GERD 4.  Intermittent nausea with vomiting.  Multifactorial.  Could be other factors able to rule out GERD. 5.  History of Barrett's esophagus and peptic stricture   PLAN:  1.  Reflux precautions 2.  Continue twice daily PPI 3.  Continue Zofran as needed.  May try the medication in a  running fashion. 4.  Schedule upper endoscopy.  Patient is HIGH RISK given her comorbidities.  She can stay on her Brilinta for the examination. 5.  We were able to move her examination up to tomorrow.  She is pleased

## 2022-08-13 ENCOUNTER — Ambulatory Visit: Payer: Medicare Other | Attending: Physician Assistant | Admitting: Physician Assistant

## 2022-08-13 ENCOUNTER — Encounter: Payer: Self-pay | Admitting: Internal Medicine

## 2022-08-13 ENCOUNTER — Telehealth: Payer: Self-pay | Admitting: *Deleted

## 2022-08-13 ENCOUNTER — Telehealth: Payer: Self-pay

## 2022-08-13 ENCOUNTER — Other Ambulatory Visit: Payer: Self-pay

## 2022-08-13 ENCOUNTER — Ambulatory Visit (AMBULATORY_SURGERY_CENTER): Payer: Self-pay | Admitting: Internal Medicine

## 2022-08-13 ENCOUNTER — Encounter: Payer: Self-pay | Admitting: *Deleted

## 2022-08-13 VITALS — BP 155/81 | HR 91 | Temp 98.6°F | Ht 65.0 in | Wt 130.0 lb

## 2022-08-13 DIAGNOSIS — K219 Gastro-esophageal reflux disease without esophagitis: Secondary | ICD-10-CM

## 2022-08-13 DIAGNOSIS — R112 Nausea with vomiting, unspecified: Secondary | ICD-10-CM

## 2022-08-13 DIAGNOSIS — R1310 Dysphagia, unspecified: Secondary | ICD-10-CM

## 2022-08-13 DIAGNOSIS — Z01810 Encounter for preprocedural cardiovascular examination: Secondary | ICD-10-CM | POA: Diagnosis not present

## 2022-08-13 MED ORDER — SODIUM CHLORIDE 0.9 % IV SOLN: 500.0000 mL | Freq: Once | INTRAVENOUS | Status: DC

## 2022-08-13 NOTE — Telephone Encounter (Signed)
Received call from [facility] stating pt procedure has moved up to today, pt is in the chair awaiting clearance. Per secure chat, preop televisit will be at 10:40am today, Harriet Pho, [place] will call pt.

## 2022-08-13 NOTE — Telephone Encounter (Signed)
  Patient Consent for Virtual Visit        Donna Bailey has provided verbal consent on 08/13/2022 for a virtual visit (video or telephone).   CONSENT FOR VIRTUAL VISIT FOR:  [place]  By participating in this virtual visit I agree to the following:  I hereby voluntarily request, consent and authorize [facility] and its employed or contracted physicians, physician assistants, nurse practitioners or other licensed health care professionals (the Practitioner), to provide me with telemedicine health care services (the "Services") as deemed necessary by the treating Practitioner. I acknowledge and consent to receive the Services by the Practitioner via telemedicine. I understand that the telemedicine visit will involve communicating with the Practitioner through live audiovisual communication technology and the disclosure of certain medical information by electronic transmission. I acknowledge that I have been given the opportunity to request an in-person assessment or other available alternative prior to the telemedicine visit and am voluntarily participating in the telemedicine visit.  I understand that I have the right to withhold or withdraw my consent to the use of telemedicine in the course of my care at any time, without affecting my right to future care or treatment, and that the Practitioner or I may terminate the telemedicine visit at any time. I understand that I have the right to inspect all information obtained and/or recorded in the course of the telemedicine visit and may receive copies of available information for a reasonable fee.  I understand that some of the potential risks of receiving the Services via telemedicine include:  Delay or interruption in medical evaluation due to technological equipment failure or disruption; Information transmitted may not be sufficient (e.g. poor resolution of images) to allow for appropriate medical decision making by the  Practitioner; and/or  In rare instances, security protocols could fail, causing a breach of personal health information.  Furthermore, I acknowledge that it is my responsibility to provide information about my medical history, conditions and care that is complete and accurate to the best of my ability. I acknowledge that Practitioner's advice, recommendations, and/or decision may be based on factors not within their control, such as incomplete or inaccurate data provided by me or distortions of diagnostic images or specimens that may result from electronic transmissions. I understand that the practice of medicine is not an exact science and that Practitioner makes no warranties or guarantees regarding treatment outcomes. I acknowledge that a copy of this consent can be made available to me via my patient portal ([facility]), or I can request a printed copy by calling the office of [facility].    I understand that my insurance will be billed for this visit.   I have read or had this consent read to me. I understand the contents of this consent, which adequately explains the benefits and risks of the Services being provided via telemedicine.  I have been provided ample opportunity to ask questions regarding this consent and the Services and have had my questions answered to my satisfaction. I give my informed consent for the services to be provided through the use of telemedicine in my medical care

## 2022-08-13 NOTE — Telephone Encounter (Signed)
Pt scheduled for EGD at [facility] 09/04/22 at 8am, pt to arrive there at 6:30am. Pt to hold Brilinta for 5 days prior to procedure. Left message for pt to call back.  Spoke with pt and she is aware. Instructions mailed to pt.
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
## 2022-08-13 NOTE — Progress Notes (Signed)
GI ATTENDING  Concerns over cardiac history raised by anesthesia.  Cardiology involved.  For elective upper endoscopy has been rescheduled for the hospital as outpatient.  Docia Chuck. Geri Seminole., M.D. [facility] Division of Gastroenterology

## 2022-08-14 NOTE — Telephone Encounter (Signed)
Left message on machine to call back  

## 2022-08-19 ENCOUNTER — Other Ambulatory Visit: Payer: Self-pay

## 2022-08-19 DIAGNOSIS — R1310 Dysphagia, unspecified: Secondary | ICD-10-CM

## 2022-08-19 DIAGNOSIS — R112 Nausea with vomiting, unspecified: Secondary | ICD-10-CM
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
## 2022-08-21 ENCOUNTER — Encounter: Payer: Self-pay | Admitting: Cardiology

## 2022-08-21 ENCOUNTER — Ambulatory Visit: Payer: Medicare Other | Attending: Cardiology | Admitting: Cardiology

## 2022-08-21 VITALS — BP 117/83 | HR 87 | Ht 65.0 in | Wt 130.0 lb

## 2022-08-21 DIAGNOSIS — I10 Essential (primary) hypertension: Secondary | ICD-10-CM

## 2022-08-21 DIAGNOSIS — I25119 Atherosclerotic heart disease of native coronary artery with unspecified angina pectoris: Secondary | ICD-10-CM

## 2022-08-21 DIAGNOSIS — I5022 Chronic systolic (congestive) heart failure: Secondary | ICD-10-CM | POA: Diagnosis not present

## 2022-08-21 DIAGNOSIS — I739 Peripheral vascular disease, unspecified: Secondary | ICD-10-CM | POA: Diagnosis not present

## 2022-08-21 DIAGNOSIS — R634 Abnormal weight loss: Secondary | ICD-10-CM

## 2022-08-21 DIAGNOSIS — I771 Stricture of artery: Secondary | ICD-10-CM

## 2022-08-21 DIAGNOSIS — I255 Ischemic cardiomyopathy: Secondary | ICD-10-CM

## 2022-08-21 DIAGNOSIS — E785 Hyperlipidemia, unspecified: Secondary | ICD-10-CM | POA: Diagnosis not present

## 2022-08-21 NOTE — Patient Instructions (Signed)
Medication Instructions:   Your physician recommends that you continue on your current medications as directed. Please refer to the Current Medication list given to you today.  *If you need a refill on your cardiac medications before your next appointment, please call your pharmacy*   Follow-Up:  As planned

## 2022-08-22 ENCOUNTER — Other Ambulatory Visit: Payer: Self-pay

## 2022-08-22 ENCOUNTER — Ambulatory Visit: Payer: Self-pay

## 2022-08-22 ENCOUNTER — Encounter (HOSPITAL_BASED_OUTPATIENT_CLINIC_OR_DEPARTMENT_OTHER): Payer: Self-pay

## 2022-08-22 ENCOUNTER — Inpatient Hospital Stay (HOSPITAL_BASED_OUTPATIENT_CLINIC_OR_DEPARTMENT_OTHER)
Admission: EM | Admit: 2022-08-22 | Discharge: 2022-09-01 | DRG: 640 | Disposition: A | Discharge status: Skilled Nursing Facility | Payer: Medicare Other | Attending: Internal Medicine | Admitting: Internal Medicine

## 2022-08-22 ENCOUNTER — Encounter (HOSPITAL_COMMUNITY): Payer: Self-pay

## 2022-08-22 DIAGNOSIS — Z79899 Other long term (current) drug therapy: Secondary | ICD-10-CM

## 2022-08-22 DIAGNOSIS — E538 Deficiency of other specified B group vitamins: Secondary | ICD-10-CM | POA: Diagnosis not present

## 2022-08-22 DIAGNOSIS — R112 Nausea with vomiting, unspecified: Secondary | ICD-10-CM

## 2022-08-22 DIAGNOSIS — E860 Dehydration: Secondary | ICD-10-CM | POA: Diagnosis present

## 2022-08-22 DIAGNOSIS — M542 Cervicalgia: Secondary | ICD-10-CM | POA: Diagnosis not present

## 2022-08-22 DIAGNOSIS — D131 Benign neoplasm of stomach: Secondary | ICD-10-CM

## 2022-08-22 DIAGNOSIS — E162 Hypoglycemia, unspecified: Secondary | ICD-10-CM | POA: Diagnosis not present

## 2022-08-22 DIAGNOSIS — F32A Depression, unspecified: Secondary | ICD-10-CM | POA: Diagnosis not present

## 2022-08-22 DIAGNOSIS — I2510 Atherosclerotic heart disease of native coronary artery without angina pectoris: Secondary | ICD-10-CM | POA: Diagnosis present

## 2022-08-22 DIAGNOSIS — Z515 Encounter for palliative care: Secondary | ICD-10-CM

## 2022-08-22 DIAGNOSIS — I130 Hypertensive heart and chronic kidney disease with heart failure and stage 1 through stage 4 chronic kidney disease, or unspecified chronic kidney disease: Secondary | ICD-10-CM | POA: Diagnosis present

## 2022-08-22 DIAGNOSIS — E8729 Other acidosis: Secondary | ICD-10-CM | POA: Diagnosis not present

## 2022-08-22 DIAGNOSIS — M797 Fibromyalgia: Secondary | ICD-10-CM | POA: Diagnosis present

## 2022-08-22 DIAGNOSIS — E8889 Other specified metabolic disorders: Secondary | ICD-10-CM | POA: Diagnosis present

## 2022-08-22 DIAGNOSIS — I5042 Chronic combined systolic (congestive) and diastolic (congestive) heart failure: Secondary | ICD-10-CM | POA: Diagnosis not present

## 2022-08-22 DIAGNOSIS — Z91040 Latex allergy status: Secondary | ICD-10-CM

## 2022-08-22 DIAGNOSIS — E43 Unspecified severe protein-calorie malnutrition: Secondary | ICD-10-CM | POA: Diagnosis not present

## 2022-08-22 DIAGNOSIS — R634 Abnormal weight loss: Secondary | ICD-10-CM

## 2022-08-22 DIAGNOSIS — R6881 Early satiety: Secondary | ICD-10-CM | POA: Diagnosis not present

## 2022-08-22 DIAGNOSIS — I739 Peripheral vascular disease, unspecified: Secondary | ICD-10-CM | POA: Diagnosis not present

## 2022-08-22 DIAGNOSIS — K2270 Barrett's esophagus without dysplasia: Secondary | ICD-10-CM | POA: Diagnosis present

## 2022-08-22 DIAGNOSIS — F4310 Post-traumatic stress disorder, unspecified: Secondary | ICD-10-CM | POA: Diagnosis present

## 2022-08-22 DIAGNOSIS — Z882 Allergy status to sulfonamides status: Secondary | ICD-10-CM

## 2022-08-22 DIAGNOSIS — Z6823 Body mass index (BMI) 23.0-23.9, adult: Secondary | ICD-10-CM

## 2022-08-22 DIAGNOSIS — K317 Polyp of stomach and duodenum: Secondary | ICD-10-CM | POA: Diagnosis present

## 2022-08-22 DIAGNOSIS — K8689 Other specified diseases of pancreas: Secondary | ICD-10-CM | POA: Diagnosis present

## 2022-08-22 DIAGNOSIS — E7800 Pure hypercholesterolemia, unspecified: Secondary | ICD-10-CM | POA: Diagnosis not present

## 2022-08-22 DIAGNOSIS — Z880 Allergy status to penicillin: Secondary | ICD-10-CM

## 2022-08-22 DIAGNOSIS — I252 Old myocardial infarction: Secondary | ICD-10-CM

## 2022-08-22 DIAGNOSIS — Z888 Allergy status to other drugs, medicaments and biological substances status: Secondary | ICD-10-CM

## 2022-08-22 DIAGNOSIS — Z5948 Other specified lack of adequate food: Secondary | ICD-10-CM

## 2022-08-22 DIAGNOSIS — G629 Polyneuropathy, unspecified: Secondary | ICD-10-CM | POA: Diagnosis present

## 2022-08-22 DIAGNOSIS — D638 Anemia in other chronic diseases classified elsewhere: Secondary | ICD-10-CM | POA: Diagnosis not present

## 2022-08-22 DIAGNOSIS — R627 Adult failure to thrive: Principal | ICD-10-CM | POA: Diagnosis present

## 2022-08-22 DIAGNOSIS — G8929 Other chronic pain: Secondary | ICD-10-CM | POA: Diagnosis present

## 2022-08-22 DIAGNOSIS — Z66 Do not resuscitate: Secondary | ICD-10-CM | POA: Diagnosis present

## 2022-08-22 DIAGNOSIS — G4700 Insomnia, unspecified: Secondary | ICD-10-CM | POA: Diagnosis present

## 2022-08-22 DIAGNOSIS — I5032 Chronic diastolic (congestive) heart failure: Secondary | ICD-10-CM | POA: Diagnosis present

## 2022-08-22 DIAGNOSIS — Z91048 Other nonmedicinal substance allergy status: Secondary | ICD-10-CM

## 2022-08-22 DIAGNOSIS — E876 Hypokalemia: Secondary | ICD-10-CM | POA: Diagnosis present

## 2022-08-22 DIAGNOSIS — R1310 Dysphagia, unspecified: Secondary | ICD-10-CM

## 2022-08-22 DIAGNOSIS — I255 Ischemic cardiomyopathy: Secondary | ICD-10-CM | POA: Diagnosis present

## 2022-08-22 DIAGNOSIS — N182 Chronic kidney disease, stage 2 (mild): Secondary | ICD-10-CM | POA: Diagnosis present

## 2022-08-22 DIAGNOSIS — R531 Weakness: Secondary | ICD-10-CM

## 2022-08-22 DIAGNOSIS — R110 Nausea: Secondary | ICD-10-CM

## 2022-08-22 DIAGNOSIS — M549 Dorsalgia, unspecified: Secondary | ICD-10-CM | POA: Diagnosis not present

## 2022-08-22 DIAGNOSIS — D649 Anemia, unspecified: Secondary | ICD-10-CM | POA: Diagnosis present

## 2022-08-22 DIAGNOSIS — Z955 Presence of coronary angioplasty implant and graft: Secondary | ICD-10-CM

## 2022-08-22 DIAGNOSIS — Z8249 Family history of ischemic heart disease and other diseases of the circulatory system: Secondary | ICD-10-CM

## 2022-08-22 DIAGNOSIS — Z91018 Allergy to other foods: Secondary | ICD-10-CM

## 2022-08-22 LAB — COMPREHENSIVE METABOLIC PANEL
ALT: 8 U/L (ref 0–44)
ALT: 13 U/L (ref 0–44)
AST: 17 U/L (ref 15–41)
AST: 24 U/L (ref 15–41)
Albumin: 2.5 g/dL — ABNORMAL LOW (ref 3.5–5.0)
Albumin: 1.9 g/dL — ABNORMAL LOW (ref 3.5–5.0)
Alkaline Phosphatase: 61 U/L (ref 38–126)
Alkaline Phosphatase: 59 U/L (ref 38–126)
Anion gap: 19 — ABNORMAL HIGH (ref 5–15)
Anion gap: 11 (ref 5–15)
BUN: 5 mg/dL — ABNORMAL LOW (ref 8–23)
BUN: <5 mg/dL — ABNORMAL LOW (ref 8–23)
CO2: 25 mmol/L (ref 22–32)
CO2: 26 mmol/L (ref 22–32)
Calcium: 8.3 mg/dL — ABNORMAL LOW (ref 8.9–10.3)
Calcium: 7.8 mg/dL — ABNORMAL LOW (ref 8.9–10.3)
Chloride: 94 mmol/L — ABNORMAL LOW (ref 98–111)
Chloride: 96 mmol/L — ABNORMAL LOW (ref 98–111)
Creatinine, Ser: 0.73 mg/dL (ref 0.44–1.00)
Creatinine, Ser: 0.66 mg/dL (ref 0.44–1.00)
GFR, Estimated: >60 mL/min (ref >60)
GFR, Estimated: >60 mL/min (ref >60)
Glucose, Bld: 69 mg/dL — ABNORMAL LOW (ref 70–99)
Glucose, Bld: 72 mg/dL (ref 70–99)
Potassium: 2.7 mmol/L — CL (ref 3.5–5.1)
Potassium: 3.1 mmol/L — ABNORMAL LOW (ref 3.5–5.1)
Sodium: 138 mmol/L (ref 135–145)
Sodium: 133 mmol/L — ABNORMAL LOW (ref 135–145)
Total Bilirubin: 0.7 mg/dL (ref 0.3–1.2)
Total Bilirubin: 0.8 mg/dL (ref 0.3–1.2)
Total Protein: 5.3 g/dL — ABNORMAL LOW (ref 6.5–8.1)
Total Protein: 4.7 g/dL — ABNORMAL LOW (ref 6.5–8.1)

## 2022-08-22 LAB — CBC WITH DIFFERENTIAL/PLATELET
Abs Immature Granulocytes: 0.03 10*3/uL (ref 0.00–0.07)
Abs Immature Granulocytes: 0.03 10*3/uL (ref 0.00–0.07)
Basophils Absolute: 0.1 10*3/uL (ref 0.0–0.1)
Basophils Absolute: 0.1 10*3/uL (ref 0.0–0.1)
Basophils Relative: 1 %
Basophils Relative: 1 %
Eosinophils Absolute: 0.1 10*3/uL (ref 0.0–0.5)
Eosinophils Absolute: 0.1 10*3/uL (ref 0.0–0.5)
Eosinophils Relative: 1 %
Eosinophils Relative: 2 %
HCT: 37.4 % (ref 36.0–46.0)
HCT: 30.9 % — ABNORMAL LOW (ref 36.0–46.0)
Hemoglobin: 13.1 g/dL (ref 12.0–15.0)
Hemoglobin: 11 g/dL — ABNORMAL LOW (ref 12.0–15.0)
Immature Granulocytes: 0 %
Immature Granulocytes: 1 %
Lymphocytes Relative: 28 %
Lymphocytes Relative: 33 %
Lymphs Abs: 1.9 10*3/uL (ref 0.7–4.0)
Lymphs Abs: 2.2 10*3/uL (ref 0.7–4.0)
MCH: 32.9 pg (ref 26.0–34.0)
MCH: 33.3 pg (ref 26.0–34.0)
MCHC: 35 g/dL (ref 30.0–36.0)
MCHC: 35.6 g/dL (ref 30.0–36.0)
MCV: 94 fL (ref 80.0–100.0)
MCV: 93.6 fL (ref 80.0–100.0)
Monocytes Absolute: 0.7 10*3/uL (ref 0.1–1.0)
Monocytes Absolute: 0.8 10*3/uL (ref 0.1–1.0)
Monocytes Relative: 11 %
Monocytes Relative: 13 %
Neutro Abs: 4 10*3/uL (ref 1.7–7.7)
Neutro Abs: 3.3 10*3/uL (ref 1.7–7.7)
Neutrophils Relative %: 59 %
Neutrophils Relative %: 50 %
Platelets: 320 10*3/uL (ref 150–400)
Platelets: UNDETERMINED 10*3/uL (ref 150–400)
RBC: 3.98 MIL/uL (ref 3.87–5.11)
RBC: 3.3 MIL/uL — ABNORMAL LOW (ref 3.87–5.11)
RDW: 16.7 % — ABNORMAL HIGH (ref 11.5–15.5)
RDW: 16.9 % — ABNORMAL HIGH (ref 11.5–15.5)
WBC: 6.8 10*3/uL (ref 4.0–10.5)
WBC: 6.5 10*3/uL (ref 4.0–10.5)
nRBC: 0 % (ref 0.0–0.2)
nRBC: 0 % (ref 0.0–0.2)

## 2022-08-22 LAB — I-STAT VENOUS BLOOD GAS, ED
Acid-Base Excess: 2 mmol/L (ref 0.0–2.0)
Bicarbonate: 24.9 mmol/L (ref 20.0–28.0)
Calcium, Ion: 1.06 mmol/L — ABNORMAL LOW (ref 1.15–1.40)
HCT: 37 % (ref 36.0–46.0)
Hemoglobin: 12.6 g/dL (ref 12.0–15.0)
O2 Saturation: 27 %
Potassium: 2.5 mmol/L — CL (ref 3.5–5.1)
Sodium: 137 mmol/L (ref 135–145)
TCO2: 26 mmol/L (ref 22–32)
pCO2, Ven: 32.5 mmHg — ABNORMAL LOW (ref 44–60)
pH, Ven: 7.493 — ABNORMAL HIGH (ref 7.25–7.43)
pO2, Ven: 16 mmHg — CL (ref 32–45)

## 2022-08-22 LAB — BASIC METABOLIC PANEL
Anion gap: 13 (ref 5–15)
BUN: <5 mg/dL — ABNORMAL LOW (ref 8–23)
CO2: 23 mmol/L (ref 22–32)
Calcium: 7.8 mg/dL — ABNORMAL LOW (ref 8.9–10.3)
Chloride: 96 mmol/L — ABNORMAL LOW (ref 98–111)
Creatinine, Ser: 0.71 mg/dL (ref 0.44–1.00)
GFR, Estimated: >60 mL/min (ref >60)
Glucose, Bld: 70 mg/dL (ref 70–99)
Potassium: 3.9 mmol/L (ref 3.5–5.1)
Sodium: 132 mmol/L — ABNORMAL LOW (ref 135–145)

## 2022-08-22 LAB — GLUCOSE, CAPILLARY: Glucose-Capillary: 88 mg/dL (ref 70–99)

## 2022-08-22 LAB — MAGNESIUM
Magnesium: 1.6 mg/dL — ABNORMAL LOW (ref 1.7–2.4)
Magnesium: 2.1 mg/dL (ref 1.7–2.4)

## 2022-08-22 LAB — LACTIC ACID, PLASMA: Lactic Acid, Venous: 1.6 mmol/L (ref 0.5–1.9)

## 2022-08-22 LAB — LIPASE, BLOOD: Lipase: <10 U/L — ABNORMAL LOW (ref 11–51)

## 2022-08-22 LAB — HEMOGLOBIN A1C
Hgb A1c MFr Bld: 4.3 % — ABNORMAL LOW (ref 4.8–5.6)
Mean Plasma Glucose: 76.71 mg/dL

## 2022-08-22 LAB — BETA-HYDROXYBUTYRIC ACID: Beta-Hydroxybutyric Acid: >8 mmol/L — ABNORMAL HIGH (ref 0.05–0.27)

## 2022-08-22 LAB — PHOSPHORUS: Phosphorus: 1.6 mg/dL — ABNORMAL LOW (ref 2.5–4.6)

## 2022-08-22 MED ORDER — POTASSIUM CHLORIDE 10 MEQ/100ML IV SOLN
10.0000 meq | INTRAVENOUS | Status: AC
  Administered 2022-08-22 (×2): 10 meq via INTRAVENOUS
  Filled 2022-08-22 (×3): qty 100

## 2022-08-22 MED ORDER — DEXTROSE 5 % IV BOLUS
1000.0000 mL | Freq: Once | INTRAVENOUS | Status: AC
  Administered 2022-08-22: 1000 mL via INTRAVENOUS

## 2022-08-22 MED ORDER — PANTOPRAZOLE SODIUM 40 MG IV SOLR
40.0000 mg | Freq: Once | INTRAVENOUS | Status: AC
  Administered 2022-08-22: 40 mg via INTRAVENOUS
  Filled 2022-08-22: qty 10

## 2022-08-22 MED ORDER — MAGNESIUM SULFATE 2 GM/50ML IV SOLN
2.0000 g | Freq: Once | INTRAVENOUS | Status: AC
  Administered 2022-08-22: 2 g via INTRAVENOUS
  Filled 2022-08-22: qty 50

## 2022-08-22 MED ORDER — LACTATED RINGERS IV BOLUS
1000.0000 mL | Freq: Once | INTRAVENOUS | Status: AC
  Administered 2022-08-22: 1000 mL via INTRAVENOUS

## 2022-08-22 MED ORDER — ONDANSETRON HCL 4 MG PO TABS
4.0000 mg | ORAL_TABLET | Freq: Four times a day (QID) | ORAL | Status: DC | PRN
  Administered 2022-08-26 – 2022-08-29 (×3): 4 mg via ORAL
  Filled 2022-08-22 (×5): qty 1

## 2022-08-22 MED ORDER — POTASSIUM PHOSPHATES 15 MMOLE/5ML IV SOLN
30.0000 mmol | Freq: Once | INTRAVENOUS | Status: AC
  Administered 2022-08-23: 30 mmol via INTRAVENOUS
  Filled 2022-08-22: qty 10

## 2022-08-22 MED ORDER — ONDANSETRON HCL 4 MG/2ML IJ SOLN
4.0000 mg | Freq: Four times a day (QID) | INTRAMUSCULAR | Status: DC | PRN
  Administered 2022-08-23 – 2022-08-29 (×5): 4 mg via INTRAVENOUS
  Filled 2022-08-22 (×5): qty 2

## 2022-08-22 MED ORDER — HEPARIN SODIUM (PORCINE) 5000 UNIT/ML IJ SOLN
5000.0000 [IU] | Freq: Three times a day (TID) | INTRAMUSCULAR | Status: DC
  Administered 2022-08-22 – 2022-09-01 (×28): 5000 [IU] via SUBCUTANEOUS
  Filled 2022-08-22 (×29): qty 1

## 2022-08-22 MED ORDER — DEXTROSE IN LACTATED RINGERS 5 % IV SOLN: INTRAVENOUS | Status: AC

## 2022-08-22 MED ORDER — ACETAMINOPHEN 650 MG RE SUPP: 650.0000 mg | Freq: Four times a day (QID) | RECTAL | Status: DC | PRN

## 2022-08-22 MED ORDER — POTASSIUM CHLORIDE 20 MEQ PO PACK
40.0000 meq | PACK | Freq: Once | ORAL | Status: AC
  Administered 2022-08-22: 40 meq via ORAL
  Filled 2022-08-22: qty 2

## 2022-08-22 MED ORDER — ALBUTEROL SULFATE (2.5 MG/3ML) 0.083% IN NEBU: 2.5000 mg | INHALATION_SOLUTION | RESPIRATORY_TRACT | Status: DC | PRN

## 2022-08-22 MED ORDER — PANTOPRAZOLE SODIUM 40 MG IV SOLR
40.0000 mg | INTRAVENOUS | Status: DC
  Administered 2022-08-23 – 2022-08-25 (×3): 40 mg via INTRAVENOUS
  Filled 2022-08-22 (×3): qty 10

## 2022-08-22 MED ORDER — ACETAMINOPHEN 325 MG PO TABS
650.0000 mg | ORAL_TABLET | Freq: Four times a day (QID) | ORAL | Status: DC | PRN
  Administered 2022-08-24: 650 mg via ORAL
  Filled 2022-08-22: qty 2

## 2022-08-22 NOTE — Plan of Care (Signed)
Plan of Care Note for accepted transfer   [name] MRN: [id number]   [place]: 08/22/2022  Facility requesting transfer: [facility] Requesting Provider: Leanord Asal, DO Reason for transfer: Dysphagia, poor oral intake, hypokalemia Facility course: Potassium of 2.7 > 2.5, given potassium supplementation. Magnesium of 1.6 given magnesium supplementation. Metabolic acidosis with anion gap of 19, presumed secondary to starvation given IV fluids; lactic acid normal.  Plan of care: The patient is accepted for admission to Telemetry unit, at [facility] informed of patient's admission and will see in consultation. Patient was scheduled for outpatient EGD in February. Per cardiology note, consideration for possible palliative care referral depending on work-up; consultation may be helpful while inpatient depending on specialist workup/findings/recommendations. Discussed with EDP to continue monitoring and repleting potassium as needed as patient awaits hospital bed.  Author: Cordelia Poche, MD 08/22/2022  Check [email or web address] for on-call coverage.  Nursing staff, Please call [place] number on Amion as soon as patient's arrival, so appropriate admitting provider can evaluate the pt.

## 2022-08-22 NOTE — Telephone Encounter (Signed)
  Chief Complaint: Weakness Symptoms: slurring words,sounds very weak Frequency: unsure Pertinent Negatives: Patient denies  Disposition: '[x]'$ ED /'[]'$ Urgent Care (no appt availability in office) / '[]'$ Appointment(In office/virtual)/ '[]'$  [facility]/ '[]'$ Home Care/ '[]'$ Refused Recommended Disposition /'[]'$ [place] [facility]/ '[]'$  Follow-up with PCP Additional Notes: PT called wanting to get prescribed IV fluids. Pt states she has not eaten since before the new year, "just a spoonful here and there".PT sounds very weak. Pt will not go to ED for fluids. Pt states she was joking but stated that she, "could hear the angles singing". Pt states she cannot wait in the ED, and that communication with her PCP is limited.   Called EMS with pt on the phone.   Reason for Disposition  Sounds like a life-threatening emergency to the triager  Answer Assessment - Initial Assessment Questions 1. REASON FOR CALL or QUESTION: "What is your reason for calling today?" or "How can I best help you?" or "What question do you have that I can help answer?"     Needs IV fluids  Answer Assessment - Initial Assessment Questions 1. DESCRIPTION: "Describe how you are feeling."     Very weak 2. SEVERITY: "How bad is it?"  "Can you stand and walk?"   - MILD (0-3): Feels weak or tired, but does not interfere with work, school or normal activities.   - MODERATE (4-7): Able to stand and walk; weakness interferes with work, school, or normal activities.   - SEVERE (8-10): Unable to stand or walk; unable to do usual activities.     severe 3. ONSET: "When did these symptoms begin?" (e.g., hours, days, weeks, months)     ongoing 4. CAUSE: "What do you think is causing the weakness or fatigue?" (e.g., not drinking enough fluids, medical problem, trouble sleeping)     Not eating - needs fluids 5. NEW MEDICINES:  "Have you started on any new medicines recently?" (e.g., opioid pain medicines, benzodiazepines, muscle relaxants,  antidepressants, antihistamines, neuroleptics, beta blockers)      6. OTHER SYMPTOMS: "Do you have any other symptoms?" (e.g., chest pain, fever, cough, SOB, vomiting, diarrhea, bleeding, other areas of pain)      7. PREGNANCY: "Is there any chance you are pregnant?" "When was your last menstrual period?"  Protocols used: Information Only Call - No Triage-A-AH, Weakness (Generalized) and Fatigue-A-AH

## 2022-08-22 NOTE — ED Notes (Addendum)
Second Lactic not drawn as first WNL at 1.6

## 2022-08-22 NOTE — ED Notes (Signed)
Attempt IV placement on RAC without success
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
## 2022-08-22 NOTE — ED Triage Notes (Signed)
Pt via [facility] c/o dehydration, "needing help," concern for FTT. States she has endoscopy scheduled in Feb, but "cardiologist states I needs liquid."  Hx broken back, 8/10 pain
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
## 2022-08-22 NOTE — ED Notes (Signed)
Joelene Millin with [facility] called for transport

## 2022-08-22 NOTE — ED Notes (Signed)
VBG completed  

## 2022-08-22 NOTE — ED Provider Notes (Signed)
[facility] Provider Note   CSN: [id number] Arrival date & time: 08/22/22  0945     History  No chief complaint on file.   Donna Bailey is a 77 y.o. female.  Patient is a 77 year old female with a past medical history of CAD status post stent placement, CKD, dysphagia and back injury last spring presenting to the emergency department for inability to tolerate p.o.  Patient states that she has been following with her GI doctor regularly since her back injury and reports that her GI doctor is concerned that she may have some type of nerve damage to her stomach that is causing difficulty with stomach emptying.  She states that she has frequent nausea and vomiting when she tries to eat any solids and sometimes with taking her pills.  She states that she is able to tolerate small sips of liquids.  She states that this is progressively worsened over the last 2 weeks with worsening weakness that she is having difficulty getting out of bed and getting to the bathroom.  She denies any fevers or chills.  She states that she has chronic right lower quadrant pain but has no new abdominal pain.  She states that she had 1 episode of diarrhea few days ago.  She denies any black or bloody stools.  She states that she is scheduled for an endoscopy on 2/1 however was recommended by her doctor to come to the emergency department due to her worsening inability to tolerate p.o.  The history is provided by the patient.       Home Medications Prior to Admission medications   Medication Sig Start Date End Date Taking? Authorizing Provider  potassium chloride SA (KLOR-CON M) 20 MEQ tablet Take 1 tablet (20 mEq total) by mouth 2 (two) times daily for 5 days. 07/30/22 08/04/22  Esterwood, Amy S, PA-C  amitriptyline (ELAVIL) 25 MG tablet Take 1 tablet by mouth as needed.    [provider]  carvedilol (COREG) 3.125 MG tablet Take 1 tablet (3.125 mg total) by  mouth 2 (two) times daily. 06/10/22 06/10/23  Richardson Dopp T, PA-C  clindamycin (CLEOCIN) 150 MG capsule Take 150 mg by mouth every 8 (eight) hours. 08/21/22   [provider]  cyanocobalamin (,VITAMIN B-12,) 1000 MCG/ML injection 1,000 mcg every 30 (thirty) days.    [provider]  Digestive Enzyme CAPS Take 2 capsules by mouth daily as needed (with large meals).    [provider]  diphenhydrAMINE (BENADRYL) 25 mg capsule Take 25 mg by mouth daily as needed for sleep or itching.    [provider]  esomeprazole (NEXIUM) 40 MG capsule TAKE 1 CAPSULE BY MOUTH TWICE A DAY 01/06/22   Zehr, Janett Billow D, PA-C  famotidine (PEPCID) 40 MG tablet Take 40 mg by mouth at bedtime.    [provider]  furosemide (LASIX) 40 MG tablet Take 1.5 tablets (60 mg total) by mouth daily. 09/26/21   Richardson Dopp T, PA-C  Gabapentin 10 % CREA Apply 1-2 application. topically at bedtime. Feet    [provider]  guaiFENesin-codeine 100-10 MG/5ML syrup Take 2.5-5 mLs by mouth daily as needed (Cold). 04/29/22   Deneise Lever, MD  hydrALAZINE (APRESOLINE) 25 MG tablet Take 1 tablet (25 mg total) by mouth daily as needed (for BP greater than 433 systolic). 09/11/20   Dorothy Spark, MD  HYDROMET 5-1.5 MG/5ML syrup take 67ms BY MOUTH EVERY 6 HOURS AS NEEDED FOR  COUGH 08/05/22   Young, Tarri Fuller D, MD  lidocaine (LIDODERM) 5 % Place 0.5 patches onto the skin daily as needed (pain). 11/04/20   [provider]  MAGNESIUM PO Take 300 mg by mouth every evening.    [provider]  Multiple Vitamins-Minerals (MULTIVITAMIN WITH MINERALS) tablet Take 1 tablet by mouth daily.    [provider]  NITRO-BID 2 % ointment Apply 1 g topically daily. Finger tips 10/31/21   [provider]  nitroGLYCERIN (NITROSTAT) 0.4 MG SL tablet Place 1 tablet (0.4 mg total) under the tongue every 5 (five) minutes as needed for chest pain. 11/16/17   Lyda Jester M, PA-C   ondansetron (ZOFRAN) 4 MG tablet Take 1 tablet (4 mg total) by mouth every 8 (eight) hours as needed for nausea or vomiting. 11/28/20   Zehr, Janett Billow D, PA-C  ondansetron (ZOFRAN-ODT) 4 MG disintegrating tablet Take 1 tablet (4 mg total) by mouth every 6 (six) hours as needed for nausea or vomiting. 07/29/22   Esterwood, Amy S, PA-C  oxyCODONE (OXY IR/ROXICODONE) 5 MG immediate release tablet Take 1-2 tablets (5-10 mg total) by mouth every 6 (six) hours as needed for moderate pain or severe pain. 11/27/21   [name], PA-C  Probiotic Product (PROBIOTIC DAILY PO) Take 1 tablet by mouth daily.    [provider]  promethazine-codeine (PHENERGAN WITH CODEINE) 6.25-10 MG/5ML syrup Take 5 mLs by mouth every 6 (six) hours as needed for cough. 09/03/21   Deneise Lever, MD  ticagrelor (BRILINTA) 90 MG TABS tablet Take 1 tablet (90 mg total) by mouth 2 (two) times daily. 11/27/21   [name], PA-C  tiZANidine (ZANAFLEX) 2 MG tablet Take 2 mg by mouth 2 (two) times daily as needed. 03/20/22   [provider]  zolpidem (AMBIEN) 10 MG tablet Take 10 mg by mouth at bedtime as needed for sleep.    [provider]      Allergies    Cephalosporins, Crestor [rosuvastatin], Doxycycline, Formoterol, Other, Sulfa antibiotics, Sulfonamide derivatives, Ciprofloxacin, Nitrofurantoin, Penicillins, Prednisone, Amlodipine, Caffeine, Carvedilol, Cephalexin, Dilt-xr [diltiazem hcl], Esomeprazole, Formoterol fumarate, Fosfomycin, Gold sodium thiosulfate, Gold-containing drug products, Hydralazine, Levofloxacin in d5w, Macrodantin [nitrofurantoin macrocrystal], Moxifloxacin, Neomycin, Ofloxacin, Praluent [alirocumab], Pregabalin, Ranexa [ranolazine er], Repatha [evolocumab], Valsartan, Welchol [colesevelam], Zetia [ezetimibe], Zolpidem, Zolpidem tartrate, Acetaminophen, Fexofenadine, Ibuprofen, Latex, Levofloxacin, Mold extract [trichophyton mentagrophyte], Molds & smuts, Nickel, Tape, and  Trichophyton    Review of Systems   Review of Systems  Physical Exam Updated Vital Signs BP 129/62   Pulse 93   Temp 98 F (36.7 C) (Oral)   Resp (!) 21   SpO2 94%  Physical Exam Vitals and nursing note reviewed.  Constitutional:      General: She is not in acute distress.    Appearance: Normal appearance.  HENT:     Head: Normocephalic and atraumatic.     Nose: Nose normal.     Mouth/Throat:     Mouth: Mucous membranes are dry.     Pharynx: Oropharynx is clear.  Eyes:     Extraocular Movements: Extraocular movements intact.     Conjunctiva/sclera: Conjunctivae normal.  Cardiovascular:     Rate and Rhythm: Normal rate and regular rhythm.     Heart sounds: Normal heart sounds.  Pulmonary:     Effort: Pulmonary effort is normal.     Breath sounds: Normal breath sounds.  Abdominal:     General: Abdomen is flat.     Palpations: Abdomen is  soft.     Tenderness: There is no abdominal tenderness.  Musculoskeletal:        General: Normal range of motion.     Cervical back: Normal range of motion and neck supple.  Skin:    General: Skin is warm and dry.  Neurological:     General: No focal deficit present.     Mental Status: She is alert and oriented to person, place, and time.  Psychiatric:        Mood and Affect: Mood normal.        Behavior: Behavior normal.     ED Results / Procedures / Treatments   Labs (all labs ordered are listed, but only abnormal results are displayed) Labs Reviewed  COMPREHENSIVE METABOLIC PANEL - Abnormal; Notable for the following components:      Result Value   Potassium 2.7 (*)    Chloride 94 (*)    Glucose, Bld 69 (*)    BUN 5 (*)    Calcium 8.3 (*)    Total Protein 5.3 (*)    Albumin 2.5 (*)    Anion gap 19 (*)    All other components within normal limits  LIPASE, BLOOD - Abnormal; Notable for the following components:   Lipase <10 (*)    All other components within normal limits  CBC WITH DIFFERENTIAL/PLATELET - Abnormal;  Notable for the following components:   RDW 16.7 (*)    All other components within normal limits  MAGNESIUM - Abnormal; Notable for the following components:   Magnesium 1.6 (*)    All other components within normal limits  I-STAT VENOUS BLOOD GAS, ED - Abnormal; Notable for the following components:   pH, Ven 7.493 (*)    pCO2, Ven 32.5 (*)    pO2, Ven 16 (*)    Potassium 2.5 (*)    Calcium, Ion 1.06 (*)    All other components within normal limits  LACTIC ACID, PLASMA  BETA-HYDROXYBUTYRIC ACID    EKG EKG Interpretation  Date/Time:  Friday August 22 2022 11:22:37 EST Ventricular Rate:  79 PR Interval:  156 QRS Duration: 82 QT Interval:  408 QTC Calculation: 468 R Axis:   13 Text Interpretation: Sinus rhythm Posterior infarct, recent Lateral t wave inversions now upright compared to prior EKG Confirmed by Leanord Asal ([id number]) on 08/22/2022 11:37:11 AM  Radiology No results found.  Procedures Procedures    Medications Ordered in ED Medications  lactated ringers bolus 1,000 mL (1,000 mLs Intravenous New Bag/Given 08/22/22 1221)  potassium chloride (KLOR-CON) packet 40 mEq (40 mEq Oral Given 08/22/22 1215)  potassium chloride 10 mEq in 100 mL IVPB (0 mEq Intravenous Stopped 08/22/22 1431)  magnesium sulfate IVPB 2 g 50 mL (0 g Intravenous Stopped 08/22/22 1305)  dextrose 5 % bolus 1,000 mL (0 mLs Intravenous Stopped 08/22/22 1421)  pantoprazole (PROTONIX) injection 40 mg (40 mg Intravenous Given 08/22/22 1426)    ED Course/ Medical Decision Making/ A&P Clinical Course as of 08/22/22 1439  Fri Aug 22, 2022  1151 Patient's workup with hypokalemia with potassium of 2.7 mild hypomagnesemia with magnesium of 1.6.  She is mildly hypoglycemic with a glucose of 69 and anion gap of 19, concerning for possible starvation ketosis.  She will have electrolyte repletion and he started on D5 bolus.  GI will be consulted for possible inpatient endoscopy and the patient will require  admission for her failure to thrive. [VK]    Clinical Course User Index [VK] Kemper Durie, DO  Medical Decision Making This patient presents to the ED with chief complaint(s) of duration with pertinent past medical history of dysphagia, CAD, CKD which further complicates the presenting complaint. The complaint involves an extensive differential diagnosis and also carries with it a high risk of complications and morbidity.    The differential diagnosis includes dehydration, electrolyte abnormality, kidney injury, failure to thrive, she has no focal neurologic deficits making CVA unlikely, she has no point abdominal tenderness and no fevers making intra-abdominal infection unlikely  Additional history obtained: Additional history obtained from N/A Records reviewed recent cardiology and GI records  ED Course and Reassessment: Upon patient's arrival to the emergency department she is awake and alert and hemodynamically stable no acute distress.  She does have dry mucous membranes concerning for dehydration.  She will be started on IV fluids and have labs performed.  She will be closely reassessed. Independent labs interpretation:  The following labs were independently interpreted: hypokalemia, mild hypomagnesemia, AGAP with metabolic acidosis, suspect starvation ketosis  Independent visualization of imaging: N/A  Consultation: - Consulted or discussed management/test interpretation w/ external professional: hospitalist  Consideration for admission or further workup: patient requires admission for further management of her inability to tolerate PO and electrolyte derangements Social Determinants of health: N/A    Amount and/or Complexity of Data Reviewed Labs: ordered.  Risk Prescription drug management. Decision regarding hospitalization.          Final Clinical Impression(s) / ED Diagnoses Final diagnoses:  Hypokalemia  Hypomagnesemia     Rx / DC Orders ED Discharge Orders     None         Kemper Durie, DO 08/22/22 1439

## 2022-08-23 DIAGNOSIS — I130 Hypertensive heart and chronic kidney disease with heart failure and stage 1 through stage 4 chronic kidney disease, or unspecified chronic kidney disease: Secondary | ICD-10-CM | POA: Diagnosis present

## 2022-08-23 DIAGNOSIS — E876 Hypokalemia: Secondary | ICD-10-CM | POA: Diagnosis present

## 2022-08-23 DIAGNOSIS — I2510 Atherosclerotic heart disease of native coronary artery without angina pectoris: Secondary | ICD-10-CM

## 2022-08-23 DIAGNOSIS — I110 Hypertensive heart disease with heart failure: Secondary | ICD-10-CM | POA: Diagnosis not present

## 2022-08-23 DIAGNOSIS — R627 Adult failure to thrive: Secondary | ICD-10-CM | POA: Diagnosis present

## 2022-08-23 DIAGNOSIS — E8729 Other acidosis: Secondary | ICD-10-CM | POA: Diagnosis not present

## 2022-08-23 DIAGNOSIS — E7800 Pure hypercholesterolemia, unspecified: Secondary | ICD-10-CM | POA: Diagnosis present

## 2022-08-23 DIAGNOSIS — R638 Other symptoms and signs concerning food and fluid intake: Secondary | ICD-10-CM

## 2022-08-23 DIAGNOSIS — R6881 Early satiety: Secondary | ICD-10-CM | POA: Diagnosis not present

## 2022-08-23 DIAGNOSIS — G8929 Other chronic pain: Secondary | ICD-10-CM | POA: Diagnosis present

## 2022-08-23 DIAGNOSIS — K2270 Barrett's esophagus without dysplasia: Secondary | ICD-10-CM | POA: Diagnosis not present

## 2022-08-23 DIAGNOSIS — I5032 Chronic diastolic (congestive) heart failure: Secondary | ICD-10-CM

## 2022-08-23 DIAGNOSIS — E860 Dehydration: Secondary | ICD-10-CM | POA: Diagnosis present

## 2022-08-23 DIAGNOSIS — E8889 Other specified metabolic disorders: Secondary | ICD-10-CM | POA: Diagnosis present

## 2022-08-23 DIAGNOSIS — R634 Abnormal weight loss: Secondary | ICD-10-CM | POA: Diagnosis not present

## 2022-08-23 DIAGNOSIS — F32A Depression, unspecified: Secondary | ICD-10-CM | POA: Diagnosis present

## 2022-08-23 DIAGNOSIS — K449 Diaphragmatic hernia without obstruction or gangrene: Secondary | ICD-10-CM | POA: Diagnosis not present

## 2022-08-23 DIAGNOSIS — D649 Anemia, unspecified: Secondary | ICD-10-CM | POA: Diagnosis present

## 2022-08-23 DIAGNOSIS — Z66 Do not resuscitate: Secondary | ICD-10-CM | POA: Diagnosis present

## 2022-08-23 DIAGNOSIS — M797 Fibromyalgia: Secondary | ICD-10-CM | POA: Diagnosis present

## 2022-08-23 DIAGNOSIS — K317 Polyp of stomach and duodenum: Secondary | ICD-10-CM | POA: Diagnosis not present

## 2022-08-23 DIAGNOSIS — E43 Unspecified severe protein-calorie malnutrition: Secondary | ICD-10-CM | POA: Diagnosis present

## 2022-08-23 DIAGNOSIS — M549 Dorsalgia, unspecified: Secondary | ICD-10-CM | POA: Diagnosis present

## 2022-08-23 DIAGNOSIS — E46 Unspecified protein-calorie malnutrition: Secondary | ICD-10-CM | POA: Diagnosis not present

## 2022-08-23 DIAGNOSIS — R630 Anorexia: Secondary | ICD-10-CM

## 2022-08-23 DIAGNOSIS — K8689 Other specified diseases of pancreas: Secondary | ICD-10-CM | POA: Diagnosis present

## 2022-08-23 DIAGNOSIS — R112 Nausea with vomiting, unspecified: Secondary | ICD-10-CM | POA: Diagnosis not present

## 2022-08-23 DIAGNOSIS — E538 Deficiency of other specified B group vitamins: Secondary | ICD-10-CM | POA: Diagnosis present

## 2022-08-23 DIAGNOSIS — I25119 Atherosclerotic heart disease of native coronary artery with unspecified angina pectoris: Secondary | ICD-10-CM | POA: Diagnosis not present

## 2022-08-23 DIAGNOSIS — M542 Cervicalgia: Secondary | ICD-10-CM | POA: Diagnosis present

## 2022-08-23 DIAGNOSIS — E162 Hypoglycemia, unspecified: Secondary | ICD-10-CM | POA: Diagnosis present

## 2022-08-23 DIAGNOSIS — N182 Chronic kidney disease, stage 2 (mild): Secondary | ICD-10-CM | POA: Diagnosis present

## 2022-08-23 DIAGNOSIS — I5022 Chronic systolic (congestive) heart failure: Secondary | ICD-10-CM | POA: Diagnosis not present

## 2022-08-23 DIAGNOSIS — R531 Weakness: Secondary | ICD-10-CM

## 2022-08-23 DIAGNOSIS — I739 Peripheral vascular disease, unspecified: Secondary | ICD-10-CM | POA: Diagnosis present

## 2022-08-23 DIAGNOSIS — I5042 Chronic combined systolic (congestive) and diastolic (congestive) heart failure: Secondary | ICD-10-CM | POA: Diagnosis present

## 2022-08-23 DIAGNOSIS — Z515 Encounter for palliative care: Secondary | ICD-10-CM | POA: Diagnosis not present

## 2022-08-23 DIAGNOSIS — K627 Radiation proctitis: Secondary | ICD-10-CM | POA: Diagnosis not present

## 2022-08-23 DIAGNOSIS — Z7189 Other specified counseling: Secondary | ICD-10-CM | POA: Diagnosis not present

## 2022-08-23 DIAGNOSIS — I252 Old myocardial infarction: Secondary | ICD-10-CM | POA: Diagnosis not present

## 2022-08-23 DIAGNOSIS — D638 Anemia in other chronic diseases classified elsewhere: Secondary | ICD-10-CM | POA: Diagnosis present

## 2022-08-23 LAB — COMPREHENSIVE METABOLIC PANEL
ALT: 9 U/L (ref 0–44)
AST: 22 U/L (ref 15–41)
Albumin: 1.8 g/dL — ABNORMAL LOW (ref 3.5–5.0)
Alkaline Phosphatase: 60 U/L (ref 38–126)
Anion gap: 11 (ref 5–15)
BUN: <5 mg/dL — ABNORMAL LOW (ref 8–23)
CO2: 27 mmol/L (ref 22–32)
Calcium: 7.8 mg/dL — ABNORMAL LOW (ref 8.9–10.3)
Chloride: 96 mmol/L — ABNORMAL LOW (ref 98–111)
Creatinine, Ser: 0.72 mg/dL (ref 0.44–1.00)
GFR, Estimated: >60 mL/min (ref >60)
Glucose, Bld: 131 mg/dL — ABNORMAL HIGH (ref 70–99)
Potassium: 3.7 mmol/L (ref 3.5–5.1)
Sodium: 134 mmol/L — ABNORMAL LOW (ref 135–145)
Total Bilirubin: 0.6 mg/dL (ref 0.3–1.2)
Total Protein: 4.4 g/dL — ABNORMAL LOW (ref 6.5–8.1)

## 2022-08-23 LAB — CBC
HCT: 31.9 % — ABNORMAL LOW (ref 36.0–46.0)
Hemoglobin: 11.3 g/dL — ABNORMAL LOW (ref 12.0–15.0)
MCH: 33 pg (ref 26.0–34.0)
MCHC: 35.4 g/dL (ref 30.0–36.0)
MCV: 93.3 fL (ref 80.0–100.0)
Platelets: 273 10*3/uL (ref 150–400)
RBC: 3.42 MIL/uL — ABNORMAL LOW (ref 3.87–5.11)
RDW: 17 % — ABNORMAL HIGH (ref 11.5–15.5)
WBC: 6.7 10*3/uL (ref 4.0–10.5)
nRBC: 0 % (ref 0.0–0.2)

## 2022-08-23 LAB — GLUCOSE, CAPILLARY
Glucose-Capillary: 114 mg/dL — ABNORMAL HIGH (ref 70–99)
Glucose-Capillary: 114 mg/dL — ABNORMAL HIGH (ref 70–99)
Glucose-Capillary: 120 mg/dL — ABNORMAL HIGH (ref 70–99)

## 2022-08-23 LAB — BETA-HYDROXYBUTYRIC ACID: Beta-Hydroxybutyric Acid: 1.93 mmol/L — ABNORMAL HIGH (ref 0.05–0.27)

## 2022-08-23 LAB — MAGNESIUM: Magnesium: 2.9 mg/dL — ABNORMAL HIGH (ref 1.7–2.4)

## 2022-08-23 MED ORDER — CYANOCOBALAMIN 1000 MCG/ML IJ SOLN: 1000.0000 ug | Freq: Once | INTRAMUSCULAR | Status: DC

## 2022-08-23 MED ORDER — FAMOTIDINE 20 MG PO TABS: 40.0000 mg | ORAL_TABLET | Freq: Every day | ORAL | Status: DC | PRN

## 2022-08-23 MED ORDER — OXYCODONE HCL 5 MG PO TABS
5.0000 mg | ORAL_TABLET | ORAL | Status: DC | PRN
  Administered 2022-08-23 – 2022-08-29 (×9): 5 mg via ORAL
  Filled 2022-08-23 (×10): qty 1

## 2022-08-23 MED ORDER — ADULT MULTIVITAMIN LIQUID CH
15.0000 mL | Freq: Every day | ORAL | Status: DC
  Administered 2022-08-24 – 2022-09-01 (×8): 15 mL via ORAL
  Filled 2022-08-23 (×9): qty 15

## 2022-08-23 MED ORDER — MORPHINE SULFATE (PF) 2 MG/ML IV SOLN
2.0000 mg | INTRAVENOUS | Status: DC | PRN
  Filled 2022-08-23: qty 1

## 2022-08-23 MED ORDER — ASPIRIN 81 MG PO CHEW
81.0000 mg | CHEWABLE_TABLET | Freq: Every day | ORAL | Status: DC
  Administered 2022-08-24 – 2022-09-01 (×8): 81 mg via ORAL
  Filled 2022-08-23 (×8): qty 1

## 2022-08-23 MED ORDER — ALUM & MAG HYDROXIDE-SIMETH 200-200-20 MG/5ML PO SUSP
30.0000 mL | Freq: Four times a day (QID) | ORAL | Status: DC | PRN
  Administered 2022-08-28: 30 mL via ORAL
  Filled 2022-08-23 (×3): qty 30

## 2022-08-23 MED ORDER — CYANOCOBALAMIN 1000 MCG/ML IJ SOLN
1000.0000 ug | INTRAMUSCULAR | Status: DC
  Administered 2022-08-24 – 2022-08-31 (×2): 1000 ug via INTRAMUSCULAR
  Filled 2022-08-23 (×2): qty 1

## 2022-08-23 MED ORDER — NITROGLYCERIN 0.4 MG SL SUBL: 0.4000 mg | SUBLINGUAL_TABLET | SUBLINGUAL | Status: DC | PRN

## 2022-08-23 MED ORDER — AMITRIPTYLINE HCL 25 MG PO TABS
25.0000 mg | ORAL_TABLET | Freq: Every day | ORAL | Status: DC
  Administered 2022-08-24 – 2022-08-31 (×8): 25 mg via ORAL
  Filled 2022-08-23 (×8): qty 1

## 2022-08-23 MED ORDER — LIDOCAINE 5 % EX PTCH
2.0000 | MEDICATED_PATCH | CUTANEOUS | Status: DC
  Administered 2022-08-23 – 2022-08-31 (×8): 2 via TRANSDERMAL
  Filled 2022-08-23 (×10): qty 2

## 2022-08-23 NOTE — H&P (View-Only) (Signed)
Consultation  Referring Provider:     [employer] Primary Care Physician:  [name], MD Primary Gastroenterologist:      Henrene Pastor Reason for Consultation:     Weight loss, anorexia, early satiety nausea and vomiting     Impression / Plan:   Longstanding anorexia, early satiety, weight loss and some nausea and vomiting of unclear etiology.  Worsening problems after thoracic compression fractures diagnosed December 2023.  Failure to thrive picture.  Protein calorie malnutrition  ----------------------------------------------------------------------------------------------------------------------- EGD tomorrow.The risks and benefits as well as alternatives of endoscopic procedure(s) have been discussed and reviewed. All questions answered. The patient agrees to proceed.  We will cancel pending February outpatient EGD scheduled with [name].  Palliative care-goals of care consultation seems appropriate.  She raised questions of hospice to our providers in late December.  Have dietitian see at some point  ? See what might be done about Comp fx though ? If more of an incidental finding  Gatha Mayer, MD, [facility] See Shea Evans on call - gastroenterology for best contact person 08/23/2022 11:08 AM   HPI:   Donna Bailey is a 77 y.o. female with a complicated past medical history that includes chronic abdominal pain and chronic intermittent nausea and vomiting, in the setting of coronary artery disease with drug-eluting stent on Brilinta, hypertension, peripheral arterial disease and ischemic cardiomyopathy as well as a history of short segment Barrett's pancreatic atrophy and chronic kidney disease.  Things seem to have escalated in the last couple of months, she was in the emergency department in December and CT discovered thoracic and lumbar spine compression fractures (T10-L4).  She is a challenging historian but it sounds like early satiety is somewhat  worse, she has been losing weight and not taking in her pills lately and has reduced eating.  She has starvation ketosis.  She had seen Nicoletta Ba and [name] in late December and early January and has a February 1 appointment for EGD that had to be scheduled at the hospital due to cardiac comorbidities.   Most recent EGD 1.5 cm Barrett's esophagus moderate hiatal hernia and fundic gland polyps no dysplasia on the Barrett's esophagus.  Colonoscopy at the same time 2 small polyps that were hyperplastic, biopsies negative for microscopic colitis and an isolated cecal diverticulum.  As above, challenging  historian and as best I can tell there were no major bowel habit changes with constipation or diarrhea. Id id not get a hx of dysphagia  She mentioned to our providers in late December that she was interested in hospice.  She has been on clindamycin in the past month or so related to some sort of dental problem that she is not clear about.  Has a follow-up with her dentist in February. Past Medical History:  Diagnosis Date   Allergic rhinitis, cause unspecified    Anemia    Aneurysm of right conjunctiva    right eye    Anxiety    Asthma    Barrett's esophagus    CAD in native artery    a. 11/2017: STEMI s/p DES to prox-mid LAD; LCx stenosis managed medically. Case complicated by R forearm hematoma. b. Several subsequent caths, last in 04/2019 with stable disease // Myoview 11/2019: EF 61, normal perfusion; low risk    CKD (chronic kidney disease), stage II    Constipation    Cystocele    Deviated nasal septum    Diaphragmatic hernia without mention of obstruction  or gangrene    Esophageal reflux    Fibromyalgia    H/O hiatal hernia    Hypertension    Insomnia, unspecified    Ischemic cardiomyopathy    a. EF 40-45% by echo 11/2017. // Echo 5/19: No new wall motion abnormalities, EF 65, no pericardial effusion, normal aortic root   Kidney stones    hx of pt see Dr. Risa Grill   Medication  intolerance    numerous   Migraine    Myalgia and myositis, unspecified    Myocardial infarct ([place]) 11/13/2017   Myocardial infarction [facility])    Nuclear stress tests    Cardiolite 2/18: no ischemia or scar, EF 78; Low Risk   Osteoarthrosis, unspecified whether generalized or localized, unspecified site    Pancreatitis    Peripheral neuropathy    Pneumonia    hx of   Pure hypercholesterolemia    Rectocele    Scoliosis (and kyphoscoliosis), idiopathic    Statin intolerance    Temporomandibular joint disorders, unspecified    Upper respiratory infection, acute 04/15/2018   Urticaria    Wheat allergy     Past Surgical History:  Procedure Laterality Date   ANKLE SURGERY     Right due to [place] N/A 11/25/2021   Procedure: AORTIC ARCH ANGIOGRAPHY;  Surgeon: Waynetta Sandy, MD;  Location: [place] [facility];  Service: Cardiovascular;  Laterality: N/A;   APPENDECTOMY     BLADDER SUSPENSION     COLONOSCOPY     COLONOSCOPY W/ POLYPECTOMY     CORONARY STENT INTERVENTION N/A 11/13/2017   Procedure: CORONARY STENT INTERVENTION;  Surgeon: Nelva Bush, MD;  Location: [place] [facility];  Service: Cardiovascular;  Laterality: N/A;   CYSTOCELE REPAIR     DENTAL SURGERY     implanted teeth   endocele  11/2008   EYE SURGERY  12/2009   Right   GROIN DISSECTION Right 11/25/2021   Procedure: RIGHT GROIN EXPLORATION;  Surgeon: Broadus John, MD;  Location: [place];  Service: Vascular;  Laterality: Right;   HAND SURGERY     bilateral   INGUINAL HERNIA REPAIR  10/08/2011   Procedure: HERNIA REPAIR INGUINAL ADULT;  Surgeon: Edward Jolly, MD;  Location: [facility];  Service: General;  Laterality: Left;  left inguinal hernia repair with mesh and excision of left groin lypoma   INTRAVASCULAR PRESSURE WIRE/FFR STUDY N/A 01/22/2018   Procedure: INTRAVASCULAR PRESSURE WIRE/FFR STUDY;  Surgeon: Nelva Bush, MD;  Location: [place] [facility];  Service:  Cardiovascular;  Laterality: N/A;   INTRAVASCULAR PRESSURE WIRE/FFR STUDY N/A 11/26/2018   Procedure: INTRAVASCULAR PRESSURE WIRE/FFR STUDY;  Surgeon: Burnell Blanks, MD;  Location: [place] [facility];  Service: Cardiovascular;  Laterality: N/A;   INTRAVASCULAR PRESSURE WIRE/FFR STUDY N/A 05/27/2021   Procedure: INTRAVASCULAR PRESSURE WIRE/FFR STUDY;  Surgeon: Nelva Bush, MD;  Location: [place] [facility];  Service: Cardiovascular;  Laterality: N/A;   INTRAVASCULAR ULTRASOUND/IVUS N/A 01/22/2018   Procedure: Intravascular Ultrasound/IVUS;  Surgeon: Nelva Bush, MD;  Location: [place] [facility];  Service: Cardiovascular;  Laterality: N/A;   IR GENERIC HISTORICAL  08/13/2016   IR RADIOLOGIST EVAL & MGMT 08/13/2016 [facility]   IR GENERIC HISTORICAL  09/12/2016   IR RADIOLOGIST EVAL & MGMT 09/12/2016 [facility]   IR GENERIC HISTORICAL  09/30/2016   IR RADIOLOGIST EVAL & MGMT 09/30/2016 [facility]   KNEE ARTHROSCOPY  2011   Right   LEFT HEART CATH AND CORONARY  ANGIOGRAPHY N/A 11/13/2017   Procedure: LEFT HEART CATH AND CORONARY ANGIOGRAPHY;  Surgeon: Nelva Bush, MD;  Location: [place] [facility];  Service: Cardiovascular;  Laterality: N/A;   LEFT HEART CATH AND CORONARY ANGIOGRAPHY N/A 01/22/2018   Procedure: LEFT HEART CATH AND CORONARY ANGIOGRAPHY;  Surgeon: Nelva Bush, MD;  Location: [place] [facility];  Service: Cardiovascular;  Laterality: N/A;   LEFT HEART CATH AND CORONARY ANGIOGRAPHY N/A 11/26/2018   Procedure: LEFT HEART CATH AND CORONARY ANGIOGRAPHY;  Surgeon: Burnell Blanks, MD;  Location: [place] [facility];  Service: Cardiovascular;  Laterality: N/A;   LEFT HEART CATH AND CORONARY ANGIOGRAPHY N/A 04/26/2019   Procedure: LEFT HEART CATH AND CORONARY ANGIOGRAPHY;  Surgeon: Sherren Mocha, MD;  Location: [place] [facility];  Service: Cardiovascular;  Laterality: N/A;   LEFT HEART CATH AND CORONARY ANGIOGRAPHY N/A 05/27/2021   Procedure: LEFT HEART  CATH AND CORONARY ANGIOGRAPHY;  Surgeon: Nelva Bush, MD;  Location: [place] [facility];  Service: Cardiovascular;  Laterality: N/A;   NASAL SEPTUM SURGERY     PERIPHERAL VASCULAR INTERVENTION  11/25/2021   Procedure: PERIPHERAL VASCULAR INTERVENTION;  Surgeon: Waynetta Sandy, MD;  Location: [place] [facility];  Service: Cardiovascular;;  Left Subclavian   POLYPECTOMY     RADIOLOGY WITH ANESTHESIA N/A 04/07/2013   Procedure: ANEURYSM EMBOLIZATION ;  Surgeon: Rob Hickman, MD;  Location: [place];  Service: Radiology;  Laterality: N/A;   right heel repair     SINOSCOPY     TEMPOROMANDIBULAR JOINT SURGERY     bilateral   TONSILLECTOMY     TYMPANOSTOMY TUBE PLACEMENT     UPPER EXTREMITY ANGIOGRAPHY N/A 11/25/2021   Procedure: Upper Extremity Angiography;  Surgeon: Waynetta Sandy, MD;  Location: [place] [facility];  Service: Cardiovascular;  Laterality: N/A;   UPPER GASTROINTESTINAL ENDOSCOPY     VAGINAL HYSTERECTOMY      Family History  Problem Relation Age of Onset   Colitis Mother    Heart disease Mother    Diverticulosis Mother    Heart disease Father    Breast cancer Sister    Cancer Sister        breast   Leukemia Sister    Cancer Sister 67       leukemia   Colon polyps Brother    Tuberculosis Brother    Stomach cancer Neg Hx    Rectal cancer Neg Hx    Esophageal cancer Neg Hx    Colon cancer Neg Hx     Social History   Tobacco Use   Smoking status: Never    Passive exposure: Never   Smokeless tobacco: Never  Vaping Use   Vaping Use: Never used  Substance Use Topics   Alcohol use: No    Alcohol/week: 0.0 standard drinks of alcohol   Drug use: No    Prior to Admission medications   Medication Sig Start Date End Date Taking? Authorizing Provider  amitriptyline (ELAVIL) 25 MG tablet Take 25 mg by mouth at bedtime.   Yes [provider]  aspirin 81 MG chewable tablet Chew 81 mg by mouth daily.   Yes [provider]   bismuth subsalicylate (PEPTO BISMOL) 262 MG chewable tablet Chew 262-524 mg by mouth as needed for indigestion or diarrhea or loose stools.   Yes [provider]  clindamycin (CLEOCIN) 150 MG capsule Take 150 mg by mouth every 8 (eight) hours. 08/21/22 08/31/22 Yes [provider]  cyanocobalamin (,VITAMIN B-12,) 1000  MCG/ML injection Inject 1,000 mcg into the muscle every Sunday.   Yes [provider]  diphenhydrAMINE (BENADRYL) 25 mg capsule Take 25 mg by mouth daily as needed for sleep or itching.   Yes [provider]  famotidine (PEPCID) 40 MG tablet Take 40 mg by mouth daily as needed for heartburn or indigestion.   Yes [provider]  guaiFENesin-codeine 100-10 MG/5ML syrup Take 2.5-5 mLs by mouth daily as needed (Cold). Patient taking differently: Take 5 mLs by mouth at bedtime. 04/29/22  Yes Young, Tarri Fuller D, MD  hydrALAZINE (APRESOLINE) 25 MG tablet Take 1 tablet (25 mg total) by mouth daily as needed (for BP greater than 295 systolic). 09/11/20  Yes Dorothy Spark, MD  lidocaine (LIDODERM) 5 % Place 0.5-1 patches onto the skin daily as needed (pain). Apply 0.5-1 patch to spine and affected shoulders daily as needed for pain- remove when directed 11/04/20  Yes [provider]  NITRO-BID 2 % ointment Apply 1 g topically See admin instructions. Apply 1 gram daily to the finger tips as directed 10/31/21  Yes [provider]  nitroGLYCERIN (NITROSTAT) 0.4 MG SL tablet Place 1 tablet (0.4 mg total) under the tongue every 5 (five) minutes as needed for chest pain. 11/16/17  Yes Simmons, Brittainy M, PA-C  NON FORMULARY Take 1 tablet by mouth See admin instructions. MagBlue tablets- Take 1 tablet by mouth once a day   Yes [provider]  ondansetron (ZOFRAN-ODT) 4 MG disintegrating tablet Take 1 tablet (4 mg total) by mouth every 6 (six) hours as needed for nausea or vomiting. 07/29/22  Yes Esterwood, Amy S, PA-C  oxyCODONE (OXY  IR/ROXICODONE) 5 MG immediate release tablet Take 1-2 tablets (5-10 mg total) by mouth every 6 (six) hours as needed for moderate pain or severe pain. 11/27/21  Yes Laurence Slate M, PA-C  potassium chloride SA (KLOR-CON M) 20 MEQ tablet Take 1 tablet (20 mEq total) by mouth 2 (two) times daily for 5 days. Patient not taking: Reported on 08/22/2022 07/30/22 08/22/22  Esterwood, Amy S, PA-C  zolpidem (AMBIEN) 10 MG tablet Take 10 mg by mouth at bedtime.   Yes [provider]  carvedilol (COREG) 3.125 MG tablet Take 1 tablet (3.125 mg total) by mouth 2 (two) times daily. Patient not taking: Reported on 08/22/2022 06/10/22 06/10/23  Richardson Dopp T, PA-C  esomeprazole (NEXIUM) 40 MG capsule TAKE 1 CAPSULE BY MOUTH TWICE A DAY Patient not taking: Reported on 08/22/2022 01/06/22   Zehr, Laban Emperor, PA-C  furosemide (LASIX) 40 MG tablet Take 1.5 tablets (60 mg total) by mouth daily. Patient not taking: Reported on 08/22/2022 09/26/21   Richardson Dopp T, PA-C  Gabapentin 10 % CREA Apply 1-2 application. topically at bedtime. Feet Patient not taking: Reported on 08/22/2022    [provider]  HYDROMET 5-1.5 MG/5ML syrup take 68ms BY MOUTH EVERY 6 HOURS AS NEEDED FOR COUGH Patient not taking: Reported on 08/22/2022 08/05/22   YDeneise Lever MD  ondansetron (ZOFRAN) 4 MG tablet Take 1 tablet (4 mg total) by mouth every 8 (eight) hours as needed for nausea or vomiting. Patient not taking: Reported on 08/22/2022 11/28/20   Zehr, JLaban Emperor PA-C  Probiotic Product (PROBIOTIC DAILY PO) Take 1 tablet by mouth daily. Patient not taking: Reported on 08/22/2022    [provider]  promethazine-codeine (PHENERGAN WITH CODEINE) 6.25-10 MG/5ML syrup Take 5 mLs by mouth every 6 (six) hours as needed for cough. Patient not taking: Reported on  08/22/2022 09/03/21   Baird Lyons D, MD  ticagrelor (BRILINTA) 90 MG TABS tablet Take 1 tablet (90 mg total) by mouth 2 (two) times daily. Patient not taking: Reported  on 08/22/2022 11/27/21   [name], PA-C    Current Facility-Administered Medications  Medication Dose Route Frequency Provider Last Rate Last Admin   acetaminophen (TYLENOL) tablet 650 mg  650 mg Oral Q6H PRN Clance Boll, MD       Or   acetaminophen (TYLENOL) suppository 650 mg  650 mg Rectal Q6H PRN Clance Boll, MD       albuterol (PROVENTIL) (2.5 MG/3ML) 0.083% nebulizer solution 2.5 mg  2.5 mg Nebulization Q2H PRN Myles Rosenthal A, MD       dextrose 5 % in lactated ringers infusion   Intravenous Continuous Clance Boll, MD 75 mL/hr at 08/23/22 0701 Restarted at 08/23/22 0701   heparin injection 5,000 Units  5,000 Units Subcutaneous Q8H Myles Rosenthal A, MD   5,000 Units at 08/23/22 5329   ondansetron [facility]) tablet 4 mg  4 mg Oral Q6H PRN Clance Boll, MD       Or   ondansetron [facility]) injection 4 mg  4 mg Intravenous Q6H PRN Clance Boll, MD       pantoprazole (PROTONIX) injection 40 mg  40 mg Intravenous Q24H Myles Rosenthal A, MD   40 mg at 08/23/22 9242    Allergies as of 08/22/2022 - Review Complete 08/22/2022  Allergen Reaction Noted   Cephalosporins Itching and Other (See Comments) 06/13/2014   Corn-containing products Itching and Other (See Comments) 06/03/2011   Crestor [rosuvastatin] Other (See Comments) 06/13/2014   Doxycycline Diarrhea and Nausea And Vomiting 09/09/2017   Formoterol Other (See Comments) 04/01/2019   Other Anaphylaxis, Itching, Rash, and Other (See Comments) 06/03/2011   Sulfa antibiotics Anaphylaxis 06/13/2014   Sulfonamide derivatives Anaphylaxis    Ciprofloxacin Other (See Comments) and Rash 04/01/2019   Nitrofurantoin Diarrhea, Nausea And Vomiting, and Other (See Comments) 04/01/2019   Penicillins Rash 04/01/2019   Prednisone Itching 05/19/2019   Amlodipine Swelling and Other (See Comments) 08/08/2021   Caffeine Other (See Comments) 06/13/2014   Carvedilol  06/25/2021   Cephalexin Other (See  Comments) 06/13/2014   Dilt-xr [diltiazem hcl]  06/25/2021   Esomeprazole Other (See Comments) 05/16/2022   Formoterol fumarate Other (See Comments)    Fosfomycin  06/25/2021   Gold sodium thiosulfate Other (See Comments) 03/27/2014   Gold-containing drug products Other (See Comments) 03/22/2018   Hydralazine  07/17/2021   Levofloxacin in d5w Other (See Comments) 04/17/2015   Macrodantin [nitrofurantoin macrocrystal] Itching 06/13/2014   Moxifloxacin Other (See Comments) 06/13/2014   Neomycin Other (See Comments) 03/27/2014   Ofloxacin Itching and Other (See Comments) 06/13/2014   Praluent [alirocumab] Other (See Comments) 12/01/2019   Pregabalin Other (See Comments) 08/08/2021   Ranexa [ranolazine er]  05/19/2019   Repatha [evolocumab]  12/01/2019   Valsartan Other (See Comments) 11/16/2018   Welchol [colesevelam] Other (See Comments) 12/01/2019   Zetia [ezetimibe]  12/01/2019   Zolpidem  05/16/2022   Acetaminophen Other (See Comments) and Rash 07/03/2011   Fexofenadine Palpitations and Other (See Comments) 06/13/2014   Ibuprofen Rash 12/31/2012   Latex Rash and Other (See Comments) 11/13/2017   Levofloxacin Other (See Comments) and Palpitations 04/01/2019   Mold extract [trichophyton mentagrophyte] Other (See Comments) 07/03/2011   Molds & smuts Rash and Other (See Comments) 07/03/2011   Nickel Rash 06/13/2014   Tape Rash 06/13/2014  Trichophyton Rash and Other (See Comments) 07/03/2011     Review of Systems:    This is positive for those things mentioned in the HPI, All other review of systems are negative.       Physical Exam:  Vital signs in last 24 hours: Temp:  [97.7 F (36.5 C)-98.3 F (36.8 C)] 98.1 F (36.7 C) (01/20 0543) Pulse Rate:  [81-95] 95 (01/20 0543) Resp:  [12-25] 16 (01/20 0543) BP: (101-146)/(59-76) 101/64 (01/20 0543) SpO2:  [94 %-100 %] 96 % (01/20 0543) Weight:  [59 kg] 59 kg (01/19 1829) Last BM Date : (P) 08/20/22  General:   Chronically ill elderly white woman in no acute distress Eyes:  anicteric. ENT:   Mouth and posterior pharynx free of lesions.  Lungs: Clear to auscultation bilaterally. Heart:   S1S2, no rubs, murmurs, gallops. Abdomen:  soft, non-tender, no hepatosplenomegaly, hernia, or mass and BS+.   Extremities:   Trace bilateral lower extremity pretibial edema Skin   no rash. Neuro:  She is alert and oriented to person place and time   Data Reviewed:   LAB RESULTS: Recent Labs    08/22/22 1105 08/22/22 1214 08/22/22 2102 08/23/22 0608  WBC 6.8  --  6.5 6.7  HGB 13.1 12.6 11.0* 11.3*  HCT 37.4 37.0 30.9* 31.9*  PLT 320  --  PLATELET CLUMPS NOTED ON SMEAR, UNABLE TO ESTIMATE 273   BMET Recent Labs    08/22/22 2102 08/22/22 2154 08/23/22 0608  NA 133* 132* 134*  K 3.1* 3.9 3.7  CL 96* 96* 96*  CO2 '26 23 27  '$ GLUCOSE 72 70 131*  BUN <5* <5* <5*  CREATININE 0.66 0.71 0.72  CALCIUM 7.8* 7.8* 7.8*   LFT Recent Labs    08/23/22 0608  PROT 4.4*  ALBUMIN 1.8*  AST 22  ALT 9  ALKPHOS 60  BILITOT 0.6      Thanks   LOS: 0 days   '@[place] [place]'$  Simonne Maffucci, MD, [facility] @  08/23/2022, 10:54 AM

## 2022-08-23 NOTE — Consult Note (Addendum)
Consultation  Referring Provider:     [facility] Primary Care Physician:  [name], MD Primary Gastroenterologist:      Henrene Pastor Reason for Consultation:     Weight loss, anorexia, early satiety nausea and vomiting     Impression / Plan:   Longstanding anorexia, early satiety, weight loss and some nausea and vomiting of unclear etiology.  Worsening problems after thoracic compression fractures diagnosed December 2023.  Failure to thrive picture.  Protein calorie malnutrition  ----------------------------------------------------------------------------------------------------------------------- EGD tomorrow.The risks and benefits as well as alternatives of endoscopic procedure(s) have been discussed and reviewed. All questions answered. The patient agrees to proceed.  We will cancel pending February outpatient EGD scheduled with [name].  Palliative care-goals of care consultation seems appropriate.  She raised questions of hospice to our providers in late December.  Have dietitian see at some point  ? See what might be done about Comp fx though ? If more of an incidental finding  Gatha Mayer, MD, [facility] Gastroenterology See Shea Evans on call - gastroenterology for best contact person 08/23/2022 11:08 AM   HPI:   Donna Bailey is a 77 y.o. female with a complicated past medical history that includes chronic abdominal pain and chronic intermittent nausea and vomiting, in the setting of coronary artery disease with drug-eluting stent on Brilinta, hypertension, peripheral arterial disease and ischemic cardiomyopathy as well as a history of short segment Barrett's pancreatic atrophy and chronic kidney disease.  Things seem to have escalated in the last couple of months, she was in the emergency department in December and CT discovered thoracic and lumbar spine compression fractures (T10-L4).  She is a challenging historian but it sounds like early satiety is somewhat  worse, she has been losing weight and not taking in her pills lately and has reduced eating.  She has starvation ketosis.  She had seen Nicoletta Ba and [name] in late December and early January and has a February 1 appointment for EGD that had to be scheduled at the hospital due to cardiac comorbidities.   Most recent EGD 1.5 cm Barrett's esophagus moderate hiatal hernia and fundic gland polyps no dysplasia on the Barrett's esophagus.  Colonoscopy at the same time 2 small polyps that were hyperplastic, biopsies negative for microscopic colitis and an isolated cecal diverticulum.  As above, challenging  historian and as best I can tell there were no major bowel habit changes with constipation or diarrhea. Id id not get a hx of dysphagia  She mentioned to our providers in late December that she was interested in hospice.  She has been on clindamycin in the past month or so related to some sort of dental problem that she is not clear about.  Has a follow-up with her dentist in February. Past Medical History:  Diagnosis Date   Allergic rhinitis, cause unspecified    Anemia    Aneurysm of right conjunctiva    right eye    Anxiety    Asthma    Barrett's esophagus    CAD in native artery    a. 11/2017: STEMI s/p DES to prox-mid LAD; LCx stenosis managed medically. Case complicated by R forearm hematoma. b. Several subsequent caths, last in 04/2019 with stable disease // Myoview 11/2019: EF 61, normal perfusion; low risk    CKD (chronic kidney disease), stage II    Constipation    Cystocele    Deviated nasal septum    Diaphragmatic hernia without mention of obstruction  or gangrene    Esophageal reflux    Fibromyalgia    H/O hiatal hernia    Hypertension    Insomnia, unspecified    Ischemic cardiomyopathy    a. EF 40-45% by echo 11/2017. // Echo 5/19: No new wall motion abnormalities, EF 65, no pericardial effusion, normal aortic root   Kidney stones    hx of pt see Dr. Risa Grill   Medication  intolerance    numerous   Migraine    Myalgia and myositis, unspecified    Myocardial infarct ([place]) 11/13/2017   Myocardial infarction [facility])    Nuclear stress tests    Cardiolite 2/18: no ischemia or scar, EF 78; Low Risk   Osteoarthrosis, unspecified whether generalized or localized, unspecified site    Pancreatitis    Peripheral neuropathy    Pneumonia    hx of   Pure hypercholesterolemia    Rectocele    Scoliosis (and kyphoscoliosis), idiopathic    Statin intolerance    Temporomandibular joint disorders, unspecified    Upper respiratory infection, acute 04/15/2018   Urticaria    Wheat allergy     Past Surgical History:  Procedure Laterality Date   ANKLE SURGERY     Right due to [place] N/A 11/25/2021   Procedure: AORTIC ARCH ANGIOGRAPHY;  Surgeon: Waynetta Sandy, MD;  Location: [place] [facility];  Service: Cardiovascular;  Laterality: N/A;   APPENDECTOMY     BLADDER SUSPENSION     COLONOSCOPY     COLONOSCOPY W/ POLYPECTOMY     CORONARY STENT INTERVENTION N/A 11/13/2017   Procedure: CORONARY STENT INTERVENTION;  Surgeon: Nelva Bush, MD;  Location: [place] [facility];  Service: Cardiovascular;  Laterality: N/A;   CYSTOCELE REPAIR     DENTAL SURGERY     implanted teeth   endocele  11/2008   EYE SURGERY  12/2009   Right   GROIN DISSECTION Right 11/25/2021   Procedure: RIGHT GROIN EXPLORATION;  Surgeon: Broadus John, MD;  Location: [place];  Service: Vascular;  Laterality: Right;   HAND SURGERY     bilateral   INGUINAL HERNIA REPAIR  10/08/2011   Procedure: HERNIA REPAIR INGUINAL ADULT;  Surgeon: Edward Jolly, MD;  Location: [facility];  Service: General;  Laterality: Left;  left inguinal hernia repair with mesh and excision of left groin lypoma   INTRAVASCULAR PRESSURE WIRE/FFR STUDY N/A 01/22/2018   Procedure: INTRAVASCULAR PRESSURE WIRE/FFR STUDY;  Surgeon: Nelva Bush, MD;  Location: [place] [facility];  Service:  Cardiovascular;  Laterality: N/A;   INTRAVASCULAR PRESSURE WIRE/FFR STUDY N/A 11/26/2018   Procedure: INTRAVASCULAR PRESSURE WIRE/FFR STUDY;  Surgeon: Burnell Blanks, MD;  Location: [place] [facility];  Service: Cardiovascular;  Laterality: N/A;   INTRAVASCULAR PRESSURE WIRE/FFR STUDY N/A 05/27/2021   Procedure: INTRAVASCULAR PRESSURE WIRE/FFR STUDY;  Surgeon: Nelva Bush, MD;  Location: [place] [facility];  Service: Cardiovascular;  Laterality: N/A;   INTRAVASCULAR ULTRASOUND/IVUS N/A 01/22/2018   Procedure: Intravascular Ultrasound/IVUS;  Surgeon: Nelva Bush, MD;  Location: [place] [facility];  Service: Cardiovascular;  Laterality: N/A;   IR GENERIC HISTORICAL  08/13/2016   IR RADIOLOGIST EVAL & MGMT 08/13/2016 [facility]   IR GENERIC HISTORICAL  09/12/2016   IR RADIOLOGIST EVAL & MGMT 09/12/2016 [facility]   IR GENERIC HISTORICAL  09/30/2016   IR RADIOLOGIST EVAL & MGMT 09/30/2016 [facility]   KNEE ARTHROSCOPY  2011   Right   LEFT HEART CATH AND CORONARY  ANGIOGRAPHY N/A 11/13/2017   Procedure: LEFT HEART CATH AND CORONARY ANGIOGRAPHY;  Surgeon: Nelva Bush, MD;  Location: [place] [facility];  Service: Cardiovascular;  Laterality: N/A;   LEFT HEART CATH AND CORONARY ANGIOGRAPHY N/A 01/22/2018   Procedure: LEFT HEART CATH AND CORONARY ANGIOGRAPHY;  Surgeon: Nelva Bush, MD;  Location: [place] [facility];  Service: Cardiovascular;  Laterality: N/A;   LEFT HEART CATH AND CORONARY ANGIOGRAPHY N/A 11/26/2018   Procedure: LEFT HEART CATH AND CORONARY ANGIOGRAPHY;  Surgeon: Burnell Blanks, MD;  Location: [place] [facility];  Service: Cardiovascular;  Laterality: N/A;   LEFT HEART CATH AND CORONARY ANGIOGRAPHY N/A 04/26/2019   Procedure: LEFT HEART CATH AND CORONARY ANGIOGRAPHY;  Surgeon: Sherren Mocha, MD;  Location: [place] [facility];  Service: Cardiovascular;  Laterality: N/A;   LEFT HEART CATH AND CORONARY ANGIOGRAPHY N/A 05/27/2021   Procedure: LEFT HEART  CATH AND CORONARY ANGIOGRAPHY;  Surgeon: Nelva Bush, MD;  Location: [place] [facility];  Service: Cardiovascular;  Laterality: N/A;   NASAL SEPTUM SURGERY     PERIPHERAL VASCULAR INTERVENTION  11/25/2021   Procedure: PERIPHERAL VASCULAR INTERVENTION;  Surgeon: Waynetta Sandy, MD;  Location: [place] [facility];  Service: Cardiovascular;;  Left Subclavian   POLYPECTOMY     RADIOLOGY WITH ANESTHESIA N/A 04/07/2013   Procedure: ANEURYSM EMBOLIZATION ;  Surgeon: Rob Hickman, MD;  Location: [place];  Service: Radiology;  Laterality: N/A;   right heel repair     SINOSCOPY     TEMPOROMANDIBULAR JOINT SURGERY     bilateral   TONSILLECTOMY     TYMPANOSTOMY TUBE PLACEMENT     UPPER EXTREMITY ANGIOGRAPHY N/A 11/25/2021   Procedure: Upper Extremity Angiography;  Surgeon: Waynetta Sandy, MD;  Location: [place] [facility];  Service: Cardiovascular;  Laterality: N/A;   UPPER GASTROINTESTINAL ENDOSCOPY     VAGINAL HYSTERECTOMY      Family History  Problem Relation Age of Onset   Colitis Mother    Heart disease Mother    Diverticulosis Mother    Heart disease Father    Breast cancer Sister    Cancer Sister        breast   Leukemia Sister    Cancer Sister 73       leukemia   Colon polyps Brother    Tuberculosis Brother    Stomach cancer Neg Hx    Rectal cancer Neg Hx    Esophageal cancer Neg Hx    Colon cancer Neg Hx     Social History   Tobacco Use   Smoking status: Never    Passive exposure: Never   Smokeless tobacco: Never  Vaping Use   Vaping Use: Never used  Substance Use Topics   Alcohol use: No    Alcohol/week: 0.0 standard drinks of alcohol   Drug use: No    Prior to Admission medications   Medication Sig Start Date End Date Taking? Authorizing Provider  amitriptyline (ELAVIL) 25 MG tablet Take 25 mg by mouth at bedtime.   Yes [provider]  aspirin 81 MG chewable tablet Chew 81 mg by mouth daily.   Yes [provider]   bismuth subsalicylate (PEPTO BISMOL) 262 MG chewable tablet Chew 262-524 mg by mouth as needed for indigestion or diarrhea or loose stools.   Yes [provider]  clindamycin (CLEOCIN) 150 MG capsule Take 150 mg by mouth every 8 (eight) hours. 08/21/22 08/31/22 Yes [provider]  cyanocobalamin (,VITAMIN B-12,) 1000  MCG/ML injection Inject 1,000 mcg into the muscle every Sunday.   Yes [provider]  diphenhydrAMINE (BENADRYL) 25 mg capsule Take 25 mg by mouth daily as needed for sleep or itching.   Yes [provider]  famotidine (PEPCID) 40 MG tablet Take 40 mg by mouth daily as needed for heartburn or indigestion.   Yes [provider]  guaiFENesin-codeine 100-10 MG/5ML syrup Take 2.5-5 mLs by mouth daily as needed (Cold). Patient taking differently: Take 5 mLs by mouth at bedtime. 04/29/22  Yes Young, Tarri Fuller D, MD  hydrALAZINE (APRESOLINE) 25 MG tablet Take 1 tablet (25 mg total) by mouth daily as needed (for BP greater than 921 systolic). 09/11/20  Yes Dorothy Spark, MD  lidocaine (LIDODERM) 5 % Place 0.5-1 patches onto the skin daily as needed (pain). Apply 0.5-1 patch to spine and affected shoulders daily as needed for pain- remove when directed 11/04/20  Yes [provider]  NITRO-BID 2 % ointment Apply 1 g topically See admin instructions. Apply 1 gram daily to the finger tips as directed 10/31/21  Yes [provider]  nitroGLYCERIN (NITROSTAT) 0.4 MG SL tablet Place 1 tablet (0.4 mg total) under the tongue every 5 (five) minutes as needed for chest pain. 11/16/17  Yes Simmons, Brittainy M, PA-C  NON FORMULARY Take 1 tablet by mouth See admin instructions. MagBlue tablets- Take 1 tablet by mouth once a day   Yes [provider]  ondansetron (ZOFRAN-ODT) 4 MG disintegrating tablet Take 1 tablet (4 mg total) by mouth every 6 (six) hours as needed for nausea or vomiting. 07/29/22  Yes Esterwood, Amy S, PA-C  oxyCODONE (OXY  IR/ROXICODONE) 5 MG immediate release tablet Take 1-2 tablets (5-10 mg total) by mouth every 6 (six) hours as needed for moderate pain or severe pain. 11/27/21  Yes Laurence Slate M, PA-C  potassium chloride SA (KLOR-CON M) 20 MEQ tablet Take 1 tablet (20 mEq total) by mouth 2 (two) times daily for 5 days. Patient not taking: Reported on 08/22/2022 07/30/22 08/22/22  Esterwood, Amy S, PA-C  zolpidem (AMBIEN) 10 MG tablet Take 10 mg by mouth at bedtime.   Yes [provider]  carvedilol (COREG) 3.125 MG tablet Take 1 tablet (3.125 mg total) by mouth 2 (two) times daily. Patient not taking: Reported on 08/22/2022 06/10/22 06/10/23  Richardson Dopp T, PA-C  esomeprazole (NEXIUM) 40 MG capsule TAKE 1 CAPSULE BY MOUTH TWICE A DAY Patient not taking: Reported on 08/22/2022 01/06/22   Zehr, Laban Emperor, PA-C  furosemide (LASIX) 40 MG tablet Take 1.5 tablets (60 mg total) by mouth daily. Patient not taking: Reported on 08/22/2022 09/26/21   Richardson Dopp T, PA-C  Gabapentin 10 % CREA Apply 1-2 application. topically at bedtime. Feet Patient not taking: Reported on 08/22/2022    [provider]  HYDROMET 5-1.5 MG/5ML syrup take 98ms BY MOUTH EVERY 6 HOURS AS NEEDED FOR COUGH Patient not taking: Reported on 08/22/2022 08/05/22   YDeneise Lever MD  ondansetron (ZOFRAN) 4 MG tablet Take 1 tablet (4 mg total) by mouth every 8 (eight) hours as needed for nausea or vomiting. Patient not taking: Reported on 08/22/2022 11/28/20   Zehr, JLaban Emperor PA-C  Probiotic Product (PROBIOTIC DAILY PO) Take 1 tablet by mouth daily. Patient not taking: Reported on 08/22/2022    [provider]  promethazine-codeine (PHENERGAN WITH CODEINE) 6.25-10 MG/5ML syrup Take 5 mLs by mouth every 6 (six) hours as needed for cough. Patient not taking: Reported on  08/22/2022 09/03/21   Baird Lyons D, MD  ticagrelor (BRILINTA) 90 MG TABS tablet Take 1 tablet (90 mg total) by mouth 2 (two) times daily. Patient not taking: Reported  on 08/22/2022 11/27/21   [name], PA-C    Current Facility-Administered Medications  Medication Dose Route Frequency Provider Last Rate Last Admin   acetaminophen (TYLENOL) tablet 650 mg  650 mg Oral Q6H PRN Clance Boll, MD       Or   acetaminophen (TYLENOL) suppository 650 mg  650 mg Rectal Q6H PRN Clance Boll, MD       albuterol (PROVENTIL) (2.5 MG/3ML) 0.083% nebulizer solution 2.5 mg  2.5 mg Nebulization Q2H PRN Myles Rosenthal A, MD       dextrose 5 % in lactated ringers infusion   Intravenous Continuous Clance Boll, MD 75 mL/hr at 08/23/22 0701 Restarted at 08/23/22 0701   heparin injection 5,000 Units  5,000 Units Subcutaneous Q8H Myles Rosenthal A, MD   5,000 Units at 08/23/22 9767   ondansetron [facility]) tablet 4 mg  4 mg Oral Q6H PRN Clance Boll, MD       Or   ondansetron [facility]) injection 4 mg  4 mg Intravenous Q6H PRN Clance Boll, MD       pantoprazole (PROTONIX) injection 40 mg  40 mg Intravenous Q24H Myles Rosenthal A, MD   40 mg at 08/23/22 3419    Allergies as of 08/22/2022 - Review Complete 08/22/2022  Allergen Reaction Noted   Cephalosporins Itching and Other (See Comments) 06/13/2014   Corn-containing products Itching and Other (See Comments) 06/03/2011   Crestor [rosuvastatin] Other (See Comments) 06/13/2014   Doxycycline Diarrhea and Nausea And Vomiting 09/09/2017   Formoterol Other (See Comments) 04/01/2019   Other Anaphylaxis, Itching, Rash, and Other (See Comments) 06/03/2011   Sulfa antibiotics Anaphylaxis 06/13/2014   Sulfonamide derivatives Anaphylaxis    Ciprofloxacin Other (See Comments) and Rash 04/01/2019   Nitrofurantoin Diarrhea, Nausea And Vomiting, and Other (See Comments) 04/01/2019   Penicillins Rash 04/01/2019   Prednisone Itching 05/19/2019   Amlodipine Swelling and Other (See Comments) 08/08/2021   Caffeine Other (See Comments) 06/13/2014   Carvedilol  06/25/2021   Cephalexin Other (See  Comments) 06/13/2014   Dilt-xr [diltiazem hcl]  06/25/2021   Esomeprazole Other (See Comments) 05/16/2022   Formoterol fumarate Other (See Comments)    Fosfomycin  06/25/2021   Gold sodium thiosulfate Other (See Comments) 03/27/2014   Gold-containing drug products Other (See Comments) 03/22/2018   Hydralazine  07/17/2021   Levofloxacin in d5w Other (See Comments) 04/17/2015   Macrodantin [nitrofurantoin macrocrystal] Itching 06/13/2014   Moxifloxacin Other (See Comments) 06/13/2014   Neomycin Other (See Comments) 03/27/2014   Ofloxacin Itching and Other (See Comments) 06/13/2014   Praluent [alirocumab] Other (See Comments) 12/01/2019   Pregabalin Other (See Comments) 08/08/2021   Ranexa [ranolazine er]  05/19/2019   Repatha [evolocumab]  12/01/2019   Valsartan Other (See Comments) 11/16/2018   Welchol [colesevelam] Other (See Comments) 12/01/2019   Zetia [ezetimibe]  12/01/2019   Zolpidem  05/16/2022   Acetaminophen Other (See Comments) and Rash 07/03/2011   Fexofenadine Palpitations and Other (See Comments) 06/13/2014   Ibuprofen Rash 12/31/2012   Latex Rash and Other (See Comments) 11/13/2017   Levofloxacin Other (See Comments) and Palpitations 04/01/2019   Mold extract [trichophyton mentagrophyte] Other (See Comments) 07/03/2011   Molds & smuts Rash and Other (See Comments) 07/03/2011   Nickel Rash 06/13/2014   Tape Rash 06/13/2014  Trichophyton Rash and Other (See Comments) 07/03/2011     Review of Systems:    This is positive for those things mentioned in the HPI, All other review of systems are negative.       Physical Exam:  Vital signs in last 24 hours: Temp:  [97.7 F (36.5 C)-98.3 F (36.8 C)] 98.1 F (36.7 C) (01/20 0543) Pulse Rate:  [81-95] 95 (01/20 0543) Resp:  [12-25] 16 (01/20 0543) BP: (101-146)/(59-76) 101/64 (01/20 0543) SpO2:  [94 %-100 %] 96 % (01/20 0543) Weight:  [59 kg] 59 kg (01/19 1829) Last BM Date : (P) 08/20/22  General:   Chronically ill elderly white woman in no acute distress Eyes:  anicteric. ENT:   Mouth and posterior pharynx free of lesions.  Lungs: Clear to auscultation bilaterally. Heart:   S1S2, no rubs, murmurs, gallops. Abdomen:  soft, non-tender, no hepatosplenomegaly, hernia, or mass and BS+.   Extremities:   Trace bilateral lower extremity pretibial edema Skin   no rash. Neuro:  She is alert and oriented to person place and time   Data Reviewed:   LAB RESULTS: Recent Labs    08/22/22 1105 08/22/22 1214 08/22/22 2102 08/23/22 0608  WBC 6.8  --  6.5 6.7  HGB 13.1 12.6 11.0* 11.3*  HCT 37.4 37.0 30.9* 31.9*  PLT 320  --  PLATELET CLUMPS NOTED ON SMEAR, UNABLE TO ESTIMATE 273   BMET Recent Labs    08/22/22 2102 08/22/22 2154 08/23/22 0608  NA 133* 132* 134*  K 3.1* 3.9 3.7  CL 96* 96* 96*  CO2 '26 23 27  '$ GLUCOSE 72 70 131*  BUN <5* <5* <5*  CREATININE 0.66 0.71 0.72  CALCIUM 7.8* 7.8* 7.8*   LFT Recent Labs    08/23/22 0608  PROT 4.4*  ALBUMIN 1.8*  AST 22  ALT 9  ALKPHOS 60  BILITOT 0.6      Thanks   LOS: 0 days   '@Erminia Mcnew'$  Simonne Maffucci, MD, [facility] @  08/23/2022, 10:54 AM

## 2022-08-23 NOTE — Assessment & Plan Note (Signed)
Patient is currently chest pain free Monitoring patient on telemetry Continue home regimen of antiplatelet therapy, As needed nitroglycerin for episodes of chest pain

## 2022-08-23 NOTE — Hospital Course (Addendum)
12RFX W/ short segment Barrett's esophagus (seen on EGD 01/2021),  pancreatic atrophy (seen on CTA 06/2021), CAD S/ STEMI with LAD PCI 5/88/3254 with cath complicated by radial artery dissection and hematoma, ischemic cardiomyopathy with systolic and diastolic heart failure(Echo 07/2021 EF 60-65% with G1DD, recovered EF from 40% in the past) presented with complaints of continued nausea vomiting and inability to tolerate oral intake. In ED:was found to be hypoglycemic as well as hypokalemia, hypomagnesemia  w/ acidosis with concerns for starvation ketosis with an elevated beta hydroxybutyrate.  Patient was admitted for further management of electrolyte imbalance starvation ketosis and failure to thrive S/p intravenous volume resuscitation and electrolyte replacement, GI has been consulted> underwent EGD> and CT abdomen pelvis without new abnormality and no signs of GI problem to explain-DVT and GI signed off. Due to concern for psychiatric cause of patient's aversion to food psychiatry has been consulted.  Concern for psych cause, possible PTSD surrounding prior MI that occurred when she tried to eat and fixation with death. 2022/09/03: Palliative meeting held extensively> goals of care addressed and changed to DNR, no feeding tube, plan for skilled nursing facility.  She wants to proceed with outpatient follow-up with her back surgery if not a candidate for back surgery then she will consider hospice route and comfort measures if able to complete some rehab at a SNF and if considered a surgical candidate then at that time she will consider PEG tube placement in the future. Remains weak and deconditioned and awaiting for skilled nursing facility placement

## 2022-08-23 NOTE — Assessment & Plan Note (Signed)
No clinical evidence of cardiogenic volume overload Strict input and output monitoring Daily weights Low-sodium diet

## 2022-08-23 NOTE — Progress Notes (Signed)
  Transition of Care [facility]) Screening Note   Patient Details  Name: Donna Bailey Date of Birth: [birth year]   Transition of [facility]) CM/SW Contact:    Henrietta Dine, RN Phone Number: 08/23/2022, 5:56 PM    Transition of Care Department [facility]) has reviewed patient and no TOC needs have been identified at this time. We will continue to monitor patient advancement through interdisciplinary progression rounds. If new patient transition needs arise, please place a TOC consult.

## 2022-08-23 NOTE — Assessment & Plan Note (Addendum)
Patient apparently suffering from a combination of anemia of chronic disease and folic acid deficiency.   Initiation of intravenous and oral folic acid  No clinical evidence of bleeding  Monitoring hemoglobin and hematocrit with serial CBCs

## 2022-08-23 NOTE — Assessment & Plan Note (Addendum)
· 

## 2022-08-23 NOTE — Progress Notes (Signed)
PROGRESS NOTE   Donna Bailey  CWC:[id number] DOB: 04/01/1946 DOA: 08/22/2022 PCP: [name], MD   Date of Service: the patient was seen and examined on 08/23/2022  Brief Narrative:  77 year old female with past medical history of short segment Barrett's esophagus (seen on EGD 01/2021),  pancreatic atrophy (seen on CTA 06/2021), coronary artery disease status post STEMI with LAD PCI 7/61/6073 with cath complicated by radial artery dissection and hematoma, ischemic cardiomyopathy with systolic and diastolic heart failure(Echo 07/2021 EF 60-65% with G1DD, recovered EF from 40% in the past) presenting to [facility] emergency department with complaints of continued nausea vomiting and inability to tolerate oral intake.  Upon evaluation in the emergency department patient was found to be hypoglycemic as well as hypokalemic and hypomagnesia make.  Patient was found to be acidemic with concerns for starvation ketosis with an elevated beta hydroxybutyrate.  Due to concerns for numerous electrolyte abnormalities and starvation ketosis hospitalist group has been called to assess the patient for admission to the hospital.  Patient has been initiated on intravenous volume resuscitation.  Attempts have been made to replace the patient's various electrolyte abnormalities including hypomagnesemia and hypokalemia.  [facility] was consulted for expedited inpatient workup of her symptoms.    Assessment and Plan: Decreased oral intake Patient presenting with volume depletion, starvation ketosis, multiple electrolyte abnormalities and evidence of severe protein calorie malnutrition with rapid weight loss and poor muscle tone While patient has a known history of Barrett's esophagus and pancreatic atrophy, etiology of patient's progressively worsening oral intake is unclear I discussed the case with Dr. Havery Moros with [facility] gastroenterology who graciously is coming to evaluate  the patient for consideration of endoscopic workup. CT imaging of the abdomen pelvis performed 07/25/2022 has not identified an etiology of the patient's ongoing difficulty with tolerating oral intake.  Ketosis ([place]) Significant starvation ketosis on arrival with markedly elevated beta hydroxybutyrate Repeat beta hydroxybutyrate performed today is downtrending with intravenous volume resuscitation Currently on a clear liquid diet per GI recommendations.  Will advance diet as soon as able. Nutrition consultation, their input is appreciated Patient will need to be monitored closely as we advance the patient's diet in case of refeeding syndrome.  Protein-calorie malnutrition, severe (HCC) Several months of extremely poor oral intake, rapid weight loss and poor muscle tone all consistent with severe protein calorie malnutrition. Once diet is able to be advanced after GI workup, will encourage oral intake Obtain nutrition consultation, their input is appreciated Will initiate nutritional supplements Will initiate multivitamins  Hypokalemia due to inadequate potassium intake Replacing with potassium chloride Evaluating for concurrent hypomagnesemia  Monitoring potassium levels with serial chemistries.   Hypomagnesemia Replacing with intravenous magnesium sulfate Monitoring magnesium levels with serial chemistries.   Coronary artery disease involving native coronary artery of native heart without angina pectoris Patient is currently chest pain free Monitoring patient on telemetry Continue home regimen of antiplatelet therapy, As needed nitroglycerin for episodes of chest pain  Chronic diastolic CHF (congestive heart failure) (HCC) No clinical evidence of cardiogenic volume overload Strict input and output monitoring Daily weights Low-sodium diet   Normocytic anemia Notable normocytic anemia No clinical evidence of bleeding We will obtain iron panel, folate, vitamin  B12 Monitoring hemoglobin and hematocrit with serial CBCs    Subjective:  Patient complaining of midthoracic and bilateral shoulder pain, sharp in quality, moderate intensity, worse with movement.  Physical Exam:  Vitals:   08/23/22 0214 08/23/22 0543 08/23/22 1259 08/23/22 2039  BP: 103/63  101/64 (!) 104/54 110/72  Pulse: 90 95 83 (!) 110  Resp: '16 16 18 18  '$ Temp: 97.7 F (36.5 C) 98.1 F (36.7 C) 98 F (36.7 C) 98.2 F (36.8 C)  TempSrc: Oral Oral Oral Oral  SpO2: 95% 96% 96% 99%  Weight:      Height:        Constitutional: Awake alert and oriented x3, no associated distress.   Skin:  Increased skill pallor, poor skin turgor noted. Eyes: Pupils are equally reactive to light.  No evidence of scleral icterus or conjunctival pallor.  ENMT: Moist mucous membranes noted.  Posterior pharynx clear of any exudate or lesions.   Respiratory: clear to auscultation bilaterally, no wheezing, no crackles. Normal respiratory effort. No accessory muscle use.  Cardiovascular: Regular rate and rhythm, no murmurs / rubs / gallops. No extremity edema. 2+ pedal pulses. No carotid bruits.  Abdomen: Abdomen is soft and nontender.  No evidence of intra-abdominal masses.  Positive bowel sounds noted in all quadrants.   Musculoskeletal: No joint deformity upper and lower extremities. Good ROM, no contractures. Poor muscle tone.    Data Reviewed:  I have personally reviewed and interpreted labs, imaging.  Significant findings are   CBC: Recent Labs  Lab 08/22/22 1105 08/22/22 1214 08/22/22 2102 08/23/22 0608  WBC 6.8  --  6.5 6.7  NEUTROABS 4.0  --  3.3  --   HGB 13.1 12.6 11.0* 11.3*  HCT 37.4 37.0 30.9* 31.9*  MCV 94.0  --  93.6 93.3  PLT 320  --  PLATELET CLUMPS NOTED ON SMEAR, UNABLE TO ESTIMATE 622   Basic Metabolic Panel: Recent Labs  Lab 08/22/22 1105 08/22/22 1214 08/22/22 2102 08/22/22 2154 08/23/22 0608  NA 138 137 133* 132* 134*  K 2.7* 2.5* 3.1* 3.9 3.7  CL 94*   --  96* 96* 96*  CO2 25  --  '26 23 27  '$ GLUCOSE 69*  --  72 70 131*  BUN 5*  --  <5* <5* <5*  CREATININE 0.73  --  0.66 0.71 0.72  CALCIUM 8.3*  --  7.8* 7.8* 7.8*  MG 1.6*  --  2.1  --  2.9*  PHOS  --   --  1.6*  --   --    GFR: Estimated Creatinine Clearance: 54.7 mL/min (by C-G formula based on SCr of 0.72 mg/dL). Liver Function Tests: Recent Labs  Lab 08/22/22 1105 08/22/22 2102 08/23/22 0608  AST '17 24 22  '$ ALT '8 13 9  '$ ALKPHOS 61 59 60  BILITOT 0.7 0.8 0.6  PROT 5.3* 4.7* 4.4*  ALBUMIN 2.5* 1.9* 1.8*     Code Status:  DNR.  Code status decision has been confirmed with: patient   Severity of Illness:  The appropriate patient status for this patient is INPATIENT. Inpatient status is judged to be reasonable and necessary in order to provide the required intensity of service to ensure the patient's safety. The patient's presenting symptoms, physical exam findings, and initial radiographic and laboratory data in the context of their chronic comorbidities is felt to place them at high risk for further clinical deterioration. Furthermore, it is not anticipated that the patient will be medically stable for discharge from the hospital within 2 midnights of admission.   * I certify that at the point of admission it is my clinical judgment that the patient will require inpatient hospital care spanning beyond 2 midnights from the point of admission due to high intensity of service, high risk  for further deterioration and high frequency of surveillance required.*  Time spent:  59 minutes  Author:  Vernelle Emerald MD  08/23/2022 11:14 PM

## 2022-08-23 NOTE — Assessment & Plan Note (Addendum)
Patient presented with starvation ketosis  Beta hydroxybutyrate downtrending  Remainder of assessment and plan as above.

## 2022-08-23 NOTE — Anesthesia Preprocedure Evaluation (Addendum)
Anesthesia Evaluation  Patient identified by MRN, date of birth, ID band Patient awake    Reviewed: Allergy & Precautions, NPO status , Patient's Chart, lab work & pertinent test results  Airway Mallampati: II  TM Distance: >3 FB Neck ROM: Full    Dental  (+) Caps, Dental Advisory Given, Teeth Intact   Pulmonary asthma , pneumonia   Pulmonary exam normal breath sounds clear to auscultation       Cardiovascular hypertension, Pt. on home beta blockers and Pt. on medications + angina  + CAD, + Past MI, + Peripheral Vascular Disease and +CHF  Normal cardiovascular exam Rhythm:Regular Rate:Normal  Echo 07/2021 1. Left ventricular ejection fraction, by estimation, is 60 to 65%. The left ventricle has normal function. The left ventricle has no regional wall motion abnormalities. Left ventricular diastolic parameters are consistent with Grade I diastolic dysfunction (impaired relaxation).  2. Right ventricular systolic function is normal. The right ventricular size is normal. Tricuspid regurgitation signal is inadequate for assessing PA pressure.  3. Left atrial size was mildly dilated.  4. The mitral valve is normal in structure. Trivial mitral valve regurgitation. No evidence of mitral stenosis.  5. The aortic valve is tricuspid. Aortic valve regurgitation is not visualized. Aortic valve sclerosis/calcification is present, without any evidence of aortic stenosis.  6. The inferior vena cava is normal in size with greater than 50% respiratory variability, suggesting right atrial pressure of 3 mmHg.    Neuro/Psych  Headaches PSYCHIATRIC DISORDERS Anxiety      Neuromuscular disease    GI/Hepatic Neg liver ROS, hiatal hernia, PUD,GERD  ,,  Endo/Other  negative endocrine ROS    Renal/GU Renal disease     Musculoskeletal  (+) Arthritis ,  Fibromyalgia -  Abdominal   Peds  Hematology  (+) Blood dyscrasia, anemia   Anesthesia  Other Findings   Reproductive/Obstetrics                             Anesthesia Physical Anesthesia Plan  ASA: 3  Anesthesia Plan: MAC   Post-op Pain Management: Minimal or no pain anticipated   Induction: Intravenous  PONV Risk Score and Plan: 2 and Ondansetron, Treatment may vary due to age or medical condition and Propofol infusion  Airway Management Planned: Simple Face Mask  Additional Equipment:   Intra-op Plan:   Post-operative Plan:   Informed Consent: I have reviewed the patients History and Physical, chart, labs and discussed the procedure including the risks, benefits and alternatives for the proposed anesthesia with the patient or authorized representative who has indicated his/her understanding and acceptance.   Patient has DNR.  Discussed DNR with patient and Suspend DNR.   Dental advisory given  Plan Discussed with: CRNA  Anesthesia Plan Comments:         Anesthesia Quick Evaluation

## 2022-08-23 NOTE — Assessment & Plan Note (Addendum)
Replaced. °

## 2022-08-23 NOTE — Assessment & Plan Note (Addendum)
Replacing with venous infusion of potassium chloride Evaluating for concurrent hypomagnesemia  Monitoring potassium levels with serial chemistries.

## 2022-08-23 NOTE — Assessment & Plan Note (Addendum)
Presenting with extended period of extremely poor oral intake with rapid weight loss, muscle wasting, severe protein calorie malnutrition, evidence of starvation ketosis and volume depletion with multiple electrolyte abnormalities  Clinically improving with intravenous volume resuscitation and correction of numerous abnormalities  Dr. Carlean Purl with gastroenterology consulted who performed upper endoscopy today which revealed no evidence of change compared to prior EGD 01/2021  Furthermore, CT imaging of the abdomen and pelvis with IV and oral contrast performed today is unrevealing for a cause of patient's profound presentation  Upon lengthy discussions with the patient I believe the patient is suffering from a psychiatric cause of her aversion to food.  She reports what seems to be a type of PTSD when faced with food, stating that it reminds her of her prior myocardial infarction.  Patient additionally seems to have a fixation with her own mortality.   Placing order for inpatient psychiatry consultation to assist with assessment and management of this, their input is appreciated. Additionally placing nutrition consultation Initiating order for nutritional supplements, patient seems willing to try breeze. Daily multivitamin

## 2022-08-23 NOTE — Plan of Care (Signed)
  Problem: Nutrition: Goal: Adequate nutrition will be maintained Outcome: Progressing   Problem: Pain Managment: Goal: General experience of comfort will improve Outcome: Progressing   Problem: Safety: Goal: Ability to remain free from injury will improve Outcome: Progressing

## 2022-08-24 ENCOUNTER — Encounter (HOSPITAL_COMMUNITY): Payer: Self-pay | Admitting: Internal Medicine

## 2022-08-24 ENCOUNTER — Inpatient Hospital Stay (HOSPITAL_COMMUNITY): Payer: Medicare Other

## 2022-08-24 ENCOUNTER — Encounter (HOSPITAL_COMMUNITY)
Admission: EM | Disposition: A | Discharge status: Skilled Nursing Facility | Payer: Self-pay | Source: Home / Self Care | Attending: Internal Medicine

## 2022-08-24 ENCOUNTER — Inpatient Hospital Stay (HOSPITAL_COMMUNITY): Payer: Medicare Other | Admitting: Anesthesiology

## 2022-08-24 DIAGNOSIS — K317 Polyp of stomach and duodenum: Secondary | ICD-10-CM

## 2022-08-24 DIAGNOSIS — K627 Radiation proctitis: Secondary | ICD-10-CM

## 2022-08-24 DIAGNOSIS — K2270 Barrett's esophagus without dysplasia: Secondary | ICD-10-CM | POA: Diagnosis not present

## 2022-08-24 DIAGNOSIS — R6881 Early satiety: Secondary | ICD-10-CM

## 2022-08-24 DIAGNOSIS — I5022 Chronic systolic (congestive) heart failure: Secondary | ICD-10-CM

## 2022-08-24 DIAGNOSIS — R638 Other symptoms and signs concerning food and fluid intake: Secondary | ICD-10-CM | POA: Diagnosis not present

## 2022-08-24 DIAGNOSIS — E43 Unspecified severe protein-calorie malnutrition: Secondary | ICD-10-CM | POA: Diagnosis not present

## 2022-08-24 DIAGNOSIS — D131 Benign neoplasm of stomach: Secondary | ICD-10-CM

## 2022-08-24 DIAGNOSIS — E46 Unspecified protein-calorie malnutrition: Secondary | ICD-10-CM | POA: Diagnosis not present

## 2022-08-24 DIAGNOSIS — E876 Hypokalemia: Secondary | ICD-10-CM | POA: Diagnosis not present

## 2022-08-24 DIAGNOSIS — E8889 Other specified metabolic disorders: Secondary | ICD-10-CM | POA: Diagnosis not present

## 2022-08-24 DIAGNOSIS — K449 Diaphragmatic hernia without obstruction or gangrene: Secondary | ICD-10-CM

## 2022-08-24 DIAGNOSIS — I110 Hypertensive heart disease with heart failure: Secondary | ICD-10-CM

## 2022-08-24 DIAGNOSIS — R634 Abnormal weight loss: Secondary | ICD-10-CM

## 2022-08-24 DIAGNOSIS — I25119 Atherosclerotic heart disease of native coronary artery with unspecified angina pectoris: Secondary | ICD-10-CM

## 2022-08-24 HISTORY — PX: ESOPHAGOGASTRODUODENOSCOPY: SHX5428

## 2022-08-24 LAB — MAGNESIUM: Magnesium: 2 mg/dL (ref 1.7–2.4)

## 2022-08-24 LAB — COMPREHENSIVE METABOLIC PANEL
ALT: 14 U/L (ref 0–44)
AST: 37 U/L (ref 15–41)
Albumin: 2.1 g/dL — ABNORMAL LOW (ref 3.5–5.0)
Alkaline Phosphatase: 73 U/L (ref 38–126)
Anion gap: 10 (ref 5–15)
BUN: <5 mg/dL — ABNORMAL LOW (ref 8–23)
CO2: 27 mmol/L (ref 22–32)
Calcium: 8.4 mg/dL — ABNORMAL LOW (ref 8.9–10.3)
Chloride: 101 mmol/L (ref 98–111)
Creatinine, Ser: 0.84 mg/dL (ref 0.44–1.00)
GFR, Estimated: >60 mL/min (ref >60)
Glucose, Bld: 96 mg/dL (ref 70–99)
Potassium: 3.2 mmol/L — ABNORMAL LOW (ref 3.5–5.1)
Sodium: 138 mmol/L (ref 135–145)
Total Bilirubin: 0.5 mg/dL (ref 0.3–1.2)
Total Protein: 5.3 g/dL — ABNORMAL LOW (ref 6.5–8.1)

## 2022-08-24 LAB — CBC WITH DIFFERENTIAL/PLATELET
Abs Immature Granulocytes: 0.03 10*3/uL (ref 0.00–0.07)
Basophils Absolute: 0.1 10*3/uL (ref 0.0–0.1)
Basophils Relative: 1 %
Eosinophils Absolute: 0.2 10*3/uL (ref 0.0–0.5)
Eosinophils Relative: 2 %
HCT: 40.3 % (ref 36.0–46.0)
Hemoglobin: 13.7 g/dL (ref 12.0–15.0)
Immature Granulocytes: 0 %
Lymphocytes Relative: 52 %
Lymphs Abs: 3.5 10*3/uL (ref 0.7–4.0)
MCH: 32.5 pg (ref 26.0–34.0)
MCHC: 34 g/dL (ref 30.0–36.0)
MCV: 95.7 fL (ref 80.0–100.0)
Monocytes Absolute: 0.8 10*3/uL (ref 0.1–1.0)
Monocytes Relative: 12 %
Neutro Abs: 2.2 10*3/uL (ref 1.7–7.7)
Neutrophils Relative %: 33 %
Platelets: 339 10*3/uL (ref 150–400)
RBC: 4.21 MIL/uL (ref 3.87–5.11)
RDW: 17.5 % — ABNORMAL HIGH (ref 11.5–15.5)
WBC: 6.8 10*3/uL (ref 4.0–10.5)
nRBC: 0 % (ref 0.0–0.2)

## 2022-08-24 LAB — GLUCOSE, CAPILLARY
Glucose-Capillary: 100 mg/dL — ABNORMAL HIGH (ref 70–99)
Glucose-Capillary: 135 mg/dL — ABNORMAL HIGH (ref 70–99)

## 2022-08-24 LAB — RETICULOCYTES
Immature Retic Fract: 18 % — ABNORMAL HIGH (ref 2.3–15.9)
RBC.: 4.06 MIL/uL (ref 3.87–5.11)
Retic Count, Absolute: 92.6 10*3/uL (ref 19.0–186.0)
Retic Ct Pct: 2.3 % (ref 0.4–3.1)

## 2022-08-24 LAB — FOLATE: Folate: 5.3 ng/mL — ABNORMAL LOW (ref >5.9)

## 2022-08-24 LAB — IRON AND TIBC
Iron: 101 ug/dL (ref 28–170)
Saturation Ratios: 94 % — ABNORMAL HIGH (ref 10.4–31.8)
TIBC: 108 ug/dL — ABNORMAL LOW (ref 250–450)
UIBC: 7 ug/dL

## 2022-08-24 LAB — VITAMIN B12: Vitamin B-12: 4007 pg/mL — ABNORMAL HIGH (ref 180–914)

## 2022-08-24 LAB — FERRITIN: Ferritin: 407 ng/mL — ABNORMAL HIGH (ref 11–307)

## 2022-08-24 SURGERY — EGD (ESOPHAGOGASTRODUODENOSCOPY)
Anesthesia: General

## 2022-08-24 MED ORDER — POTASSIUM CHLORIDE 2 MEQ/ML IV SOLN
INTRAVENOUS | Status: AC
  Filled 2022-08-24 (×2): qty 1000

## 2022-08-24 MED ORDER — SPOT INK MARKER SYRINGE KIT
PACK | SUBMUCOSAL | Status: AC
  Filled 2022-08-24: qty 5

## 2022-08-24 MED ORDER — PROPOFOL 500 MG/50ML IV EMUL
INTRAVENOUS | Status: DC | PRN
  Administered 2022-08-24: 125 ug/kg/min via INTRAVENOUS

## 2022-08-24 MED ORDER — FOLIC ACID 1 MG PO TABS: 1.0000 mg | ORAL_TABLET | Freq: Every day | ORAL | Status: DC

## 2022-08-24 MED ORDER — ENSURE ENLIVE PO LIQD
237.0000 mL | Freq: Two times a day (BID) | ORAL | Status: DC
  Administered 2022-08-24: 237 mL via ORAL

## 2022-08-24 MED ORDER — PROPOFOL 10 MG/ML IV BOLUS
INTRAVENOUS | Status: AC
  Filled 2022-08-24: qty 20

## 2022-08-24 MED ORDER — FOLIC ACID 5 MG/ML IJ SOLN
1.0000 mg | Freq: Once | INTRAMUSCULAR | Status: AC
  Administered 2022-08-24: 1 mg via INTRAVENOUS
  Filled 2022-08-24: qty 0.2

## 2022-08-24 MED ORDER — LIDOCAINE 2% (20 MG/ML) 5 ML SYRINGE
INTRAMUSCULAR | Status: DC | PRN
  Administered 2022-08-24: 60 mg via INTRAVENOUS

## 2022-08-24 MED ORDER — EPINEPHRINE 1 MG/10ML IJ SOSY
PREFILLED_SYRINGE | INTRAMUSCULAR | Status: AC
  Filled 2022-08-24: qty 10

## 2022-08-24 MED ORDER — FOLIC ACID 1 MG PO TABS
1.0000 mg | ORAL_TABLET | Freq: Every day | ORAL | Status: DC
  Administered 2022-08-25 – 2022-09-01 (×7): 1 mg via ORAL
  Filled 2022-08-24 (×7): qty 1

## 2022-08-24 MED ORDER — IOHEXOL 300 MG/ML  SOLN
100.0000 mL | Freq: Once | INTRAMUSCULAR | Status: AC | PRN
  Administered 2022-08-24: 100 mL via INTRAVENOUS

## 2022-08-24 MED ORDER — LACTATED RINGERS IV SOLN: INTRAVENOUS | Status: DC

## 2022-08-24 MED ORDER — MIRTAZAPINE 15 MG PO TABS
7.5000 mg | ORAL_TABLET | Freq: Every day | ORAL | Status: DC
  Administered 2022-08-24 – 2022-08-31 (×8): 7.5 mg via ORAL
  Filled 2022-08-24 (×8): qty 1

## 2022-08-24 MED ORDER — SODIUM CHLORIDE 0.9 % IV SOLN: INTRAVENOUS | Status: DC

## 2022-08-24 MED ORDER — SODIUM CHLORIDE 0.9 % IV SOLN: 1.0000 mg | Freq: Once | INTRAVENOUS | Status: DC

## 2022-08-24 MED ORDER — IOHEXOL 9 MG/ML PO SOLN
500.0000 mL | ORAL | Status: AC
  Administered 2022-08-24 (×2): 500 mL via ORAL

## 2022-08-24 MED ORDER — BOOST / RESOURCE BREEZE PO LIQD CUSTOM
1.0000 | Freq: Three times a day (TID) | ORAL | Status: DC
  Administered 2022-08-25 – 2022-08-31 (×13): 1 via ORAL

## 2022-08-24 MED ORDER — PROPOFOL 10 MG/ML IV BOLUS
INTRAVENOUS | Status: DC | PRN
  Administered 2022-08-24: 20 mg via INTRAVENOUS
  Administered 2022-08-24 (×2): 10 mg via INTRAVENOUS

## 2022-08-24 MED ORDER — PHENYLEPHRINE 80 MCG/ML (10ML) SYRINGE FOR IV PUSH (FOR BLOOD PRESSURE SUPPORT)
PREFILLED_SYRINGE | INTRAVENOUS | Status: DC | PRN
  Administered 2022-08-24 (×2): 160 ug via INTRAVENOUS

## 2022-08-24 NOTE — Op Note (Addendum)
[facility] Patient Name: Donna Bailey Procedure Date: 08/24/2022 MRN: [id number] Attending MD: Gatha Mayer , MD, [id number] Date of Birth: May 25, 1946 CSN: [id number] Age: 77 Admit Type: Inpatient Procedure:                Upper GI endoscopy Indications:              Early satiety, Failure to thrive, Malnutrition,                            Nausea with vomiting, Weight loss Providers:                Gatha Mayer, MD, Adah Perl RN, RN,                            William Dalton, Technician Referring MD:             [facility] Medicines:                Monitored Anesthesia Care Complications:            No immediate complications. Estimated Blood Loss:     Estimated blood loss: none. Procedure:                Pre-Anesthesia Assessment:                           - Prior to the procedure, a History and Physical                            was performed, and patient medications and                            allergies were reviewed. The patient's tolerance of                            previous anesthesia was also reviewed. The risks                            and benefits of the procedure and the sedation                            options and risks were discussed with the patient.                            All questions were answered, and informed consent                            was obtained. Prior Anticoagulants: The patient                            last took Brilinta (ticagrelor) 3 days and heparin                            1 day prior to the procedure. ASA Grade Assessment:  III - A patient with severe systemic disease. After                            reviewing the risks and benefits, the patient was                            deemed in satisfactory condition to undergo the                            procedure.                           After obtaining informed consent, the endoscope was                            passed  under direct vision. Throughout the                            procedure, the patient's blood pressure, pulse, and                            oxygen saturations were monitored continuously. The                            [id number]-[id number] ([id number]) Olympus endoscope was introduced                            through the mouth, and advanced to the second part                            of duodenum. The upper GI endoscopy was                            accomplished without difficulty. The patient                            tolerated the procedure well. Scope In: Scope Out: Findings:      There were esophageal mucosal changes secondary to established       short-segment Barrett's disease present in the distal esophagus. The       maximum longitudinal extent of these mucosal changes was 1.5 cm in       length.      A small hiatal hernia was present.      Multiple small semi-sessile polyps with no bleeding and no stigmata of       recent bleeding were found in the cardia, in the gastric fundus and in       the gastric body.      The examined duodenum was normal.      The cardia and gastric fundus were normal on retroflexion. Impression:               - Esophageal mucosal changes secondary to                            established short-segment Barrett's disease. This  is known and was w/o dysplasia in 2022 so no biopsy                           - Small hiatal hernia.                           - Multiple gastric polyps. Consitent with know,                            benign fundic gland polyps                           - Normal examined duodenum.                           - No specimens collected. THIS EXAM IS UNCHANGED                            FROM 2022 AND DOES NOT SHOW A CAUSE OF HER                            MULTITUDE OF SYMPTOMS AND FAILURE THO THRIVE Moderate Sedation:      Not Applicable - Patient had care per Anesthesia. Recommendation:           - Return patient  to hospital ward for ongoing care.                           - regular diet                           - Will get CT abd/pelvis w/ contrast for                            completeness (CT in 12/23 w/o contrast)                           Trial of mirtazapine 7.5 mg qhs to help functional                            GI sxs and weight loss, anorexia - and likely                            depression                           I have consulted nutrition                           Would consult palliative care if not done Procedure Code(s):        --- Professional ---                           [phone], Esophagogastroduodenoscopy, flexible,  transoral; diagnostic, including collection of                            specimen(s) by brushing or washing, when performed                            (separate procedure) Diagnosis Code(s):        --- Professional ---                           K22.70, Barrett's esophagus without dysplasia                           K44.9, Diaphragmatic hernia without obstruction or                            gangrene                           K31.7, Polyp of stomach and duodenum                           R68.81, Early satiety                           R62.7, Adult failure to thrive                           E46, Unspecified protein-calorie malnutrition                           R11.2, Nausea with vomiting, unspecified                           R63.4, Abnormal weight loss CPT copyright 2022 American Medical Association. All rights reserved. The codes documented in this report are preliminary and upon coder review may  be revised to meet current compliance requirements. Gatha Mayer, MD 08/24/2022 8:14:38 AM This report has been signed electronically. Number of Addenda: 0

## 2022-08-24 NOTE — Progress Notes (Signed)
   CT abd/pelvis is w/o new abnormality and no signs of GI problems that would explain her FTT, etc.  Signing off.  Thanks and good luck.  Gatha Mayer, MD, [facility] See Shea Evans on call - gastroenterology for best contact person 08/24/2022 2:25 PM

## 2022-08-24 NOTE — Progress Notes (Signed)
Initial Nutrition Assessment  DOCUMENTATION CODES:   Not applicable  INTERVENTION:   Recommend obtaining new weight.  Multivitamin w/ minerals daily  Ensure Enlive po BID, each supplement provides 350 kcal and 20 grams of protein. Encourage good PO intake   NUTRITION DIAGNOSIS:   Inadequate oral intake related to nausea, vomiting as evidenced by per patient/family report.  GOAL:   Patient will meet greater than or equal to 90% of their needs  MONITOR:   PO intake, Supplement acceptance, Skin, Labs  REASON FOR ASSESSMENT:   Consult Assessment of nutrition requirement/status, Poor PO  ASSESSMENT:   77 y.o. female presented to the ED with inability to tolerate PO intake. PMH includes CKD II, CHF, GERD with Barrett's esophagus, CAD, pancreatitis, and HTN. Pt admitted with decreased oral intake, hypokalemia, hypomagnesemia, and ketosis.    1/19 - Admitted 1/21 - EGD, unchanged from previous exam in 2022  RD working remotely at time of assessment. Pt in [place], discussed with RN. RN reports that pt has not had anything but the contrast.   Per notes, pt has had a poor appetite and N/V for a period of time. Was able to tolerate sips of liquids.  Per EMR, pt with significant weight loss since September 2023, although past 4 weights appear to be stated. Would recommend obtaining a new weight to assess weight loss.   RD to order pt oral nutrition supplements due to ongoing poor PO intake.  Medications reviewed and include: Vitamin B12, Remeron, Protonix Labs reviewed: Sodium 138, Potassium 3.2 (L), Magnesium 2.0, Vitamin B12 4007 (H), Hgb A1c 4.3%, 24 hr CBGs 100-135  NUTRITION - FOCUSED PHYSICAL EXAM:  Deferred to follow-up.   Diet Order:   Diet Order             Diet regular Room service appropriate? Yes; Fluid consistency: Thin  Diet effective now                   EDUCATION NEEDS:   No education needs have been identified at this time  Skin:  Skin Assessment:  Reviewed RN Assessment  Last BM:  1/20  Height:   Ht Readings from Last 1 Encounters:  08/22/22 '5\' 5"'$  (1.651 m)    Weight:   Wt Readings from Last 1 Encounters:  08/22/22 59 kg    Ideal Body Weight:  56.8 kg  BMI:  Body mass index is 21.63 kg/m.  Estimated Nutritional Needs:  Kcal:  1500-1700 Protein:  75-90 grams Fluid:  >/= 1.5 L   Hermina Barters RD, LDN Clinical Dietitian See [facility] for contact information.

## 2022-08-24 NOTE — Interval H&P Note (Signed)
History and Physical Interval Note:  08/24/2022 7:36 AM  Donna Bailey  has presented today for surgery, with the diagnosis of weight loss, anorexia, nause aand vomiting.  The various methods of treatment have been discussed with the patient and family. After consideration of risks, benefits and other options for treatment, the patient has consented to  Procedure(s): ESOPHAGOGASTRODUODENOSCOPY (EGD) (N/A) as a surgical intervention.  The patient's history has been reviewed, patient examined, no change in status, stable for surgery.  I have reviewed the patient's chart and labs.  Questions were answered to the patient's satisfaction.     Silvano Rusk

## 2022-08-24 NOTE — Anesthesia Postprocedure Evaluation (Signed)
Anesthesia Post Note  [name]  Procedure(s) Performed: ESOPHAGOGASTRODUODENOSCOPY (EGD)     Patient location during evaluation: Endoscopy Anesthesia Type: General Level of consciousness: awake and alert Pain management: pain level controlled Vital Signs Assessment: post-procedure vital signs reviewed and stable Respiratory status: spontaneous breathing Cardiovascular status: stable Anesthetic complications: no   No notable events documented.  Last Vitals:  Vitals:   08/24/22 0821 08/24/22 1513  BP: (!) 124/40 (!) 115/57  Pulse: 87 95  Resp: 18 18  Temp:  36.6 C  SpO2: 100% 95%    Last Pain:  Vitals:   08/24/22 1513  TempSrc: Oral  PainSc:                  Nolon Nations

## 2022-08-24 NOTE — Progress Notes (Signed)
PROGRESS NOTE   Donna Bailey  MEQ:[id number] DOB: 05-04-1946 DOA: 08/22/2022 PCP: [name], MD   Date of Service: the patient was seen and examined on 08/24/2022  Brief Narrative:  77 year old female with past medical history of short segment Barrett's esophagus (seen on EGD 01/2021),  pancreatic atrophy (seen on CTA 06/2021), coronary artery disease status post STEMI with LAD PCI 2/97/9892 with cath complicated by radial artery dissection and hematoma, ischemic cardiomyopathy with systolic and diastolic heart failure(Echo 07/2021 EF 60-65% with G1DD, recovered EF from 40% in the past) presenting to [facility] emergency department with complaints of continued nausea vomiting and inability to tolerate oral intake.  Upon evaluation in the emergency department patient was found to be hypoglycemic as well as hypokalemic and hypomagnesia make.  Patient was found to be acidemic with concerns for starvation ketosis with an elevated beta hydroxybutyrate.  Due to concerns for numerous electrolyte abnormalities and starvation ketosis hospitalist group has been called to assess the patient for admission to the hospital.  Patient has been initiated on intravenous volume resuscitation.  Attempts have been made to replace the patient's various electrolyte abnormalities including hypomagnesemia and hypokalemia.  [place] [facility] was consulted for expedited inpatient workup of her symptoms.    Assessment and Plan: * Failure to thrive in adult Presenting with extended period of extremely poor oral intake with rapid weight loss, muscle wasting, severe protein calorie malnutrition, evidence of starvation ketosis and volume depletion with multiple electrolyte abnormalities  Clinically improving with intravenous volume resuscitation and correction of numerous abnormalities  Dr. Carlean Purl with gastroenterology consulted who performed upper endoscopy today which revealed no evidence of  change compared to prior EGD 01/2021  Furthermore, CT imaging of the abdomen and pelvis with IV and oral contrast performed today is unrevealing for a cause of patient's profound presentation  Upon lengthy discussions with the patient I believe the patient is suffering from a psychiatric cause of her aversion to food.  She reports what seems to be a type of PTSD when faced with food, stating that it reminds her of her prior myocardial infarction.  Patient additionally seems to have a fixation with her own mortality.   Placing order for inpatient psychiatry consultation to assist with assessment and management of this, their input is appreciated. Additionally placing nutrition consultation Initiating order for nutritional supplements, patient seems willing to try breeze. Daily multivitamin  Ketosis ([place]) Patient presented with starvation ketosis  Beta hydroxybutyrate downtrending  Remainder of assessment and plan as above.    Protein-calorie malnutrition, severe ([place]) Please see assessment and plan above  Hypokalemia due to inadequate potassium intake Replacing with venous infusion of potassium chloride Evaluating for concurrent hypomagnesemia  Monitoring potassium levels with serial chemistries.   Hypomagnesemia Replaced   Coronary artery disease involving native coronary artery of native heart without angina pectoris Patient is currently chest pain free Monitoring patient on telemetry Continue home regimen of antiplatelet therapy, As needed nitroglycerin for episodes of chest pain  Chronic diastolic CHF (congestive heart failure) (HCC) No clinical evidence of cardiogenic volume overload Strict input and output monitoring Daily weights Low-sodium diet   Normocytic anemia Patient apparently suffering from a combination of anemia of chronic disease and folic acid deficiency.   Initiation of intravenous and oral folic acid  No clinical evidence of bleeding  Monitoring  hemoglobin and hematocrit with serial CBCs         Subjective:  Patient complains of continued generalized weakness.  Patient denies associated  chest pain or shortness of breath.  Patient continues to state that she does not want to eat various foods for various reasons.  Patient denies abdominal pain.  Physical Exam:  Vitals:   08/24/22 0816 08/24/22 0821 08/24/22 1513 08/24/22 2048  BP: (!) 114/59 (!) 124/40 (!) 115/57 110/63  Pulse: 85 87 95 95  Resp: '14 18 18 16  '$ Temp:   97.8 F (36.6 C) 98.2 F (36.8 C)  TempSrc:   Oral Oral  SpO2: 96% 100% 95% 96%  Weight:      Height:        Constitutional: Awake alert and oriented x3, no associated distress.   Skin: no rashes, no lesions, good skin turgor noted. Eyes: Pupils are equally reactive to light.  No evidence of scleral icterus or conjunctival pallor.  ENMT: Moist mucous membranes noted.  Posterior pharynx clear of any exudate or lesions.   Respiratory: clear to auscultation bilaterally, no wheezing, no crackles. Normal respiratory effort. No accessory muscle use.  Cardiovascular: Regular rate and rhythm, no murmurs / rubs / gallops. No extremity edema. 2+ pedal pulses. No carotid bruits.  Abdomen: Abdomen is soft and nontender.  No evidence of intra-abdominal masses.  Positive bowel sounds noted in all quadrants.   Musculoskeletal: No joint deformity upper and lower extremities. Good ROM, no contractures.  Extremely poor muscle tone.   Psych: Odd affect.  Seemingly anxious particularly surrounding discussions about eating food.  Patient repeatedly refers to her prior history of myocardial infarction and somehow equates this to attempt to eat.  Patient also seems intently focused on her own mortality.  No evidence of SI/HI.   Data Reviewed:  I have personally reviewed and interpreted labs, imaging.  Significant findings are   CBC: Recent Labs  Lab 08/22/22 1105 08/22/22 1214 08/22/22 2102 08/23/22 0608  08/24/22 1045  WBC 6.8  --  6.5 6.7 6.8  NEUTROABS 4.0  --  3.3  --  2.2  HGB 13.1 12.6 11.0* 11.3* 13.7  HCT 37.4 37.0 30.9* 31.9* 40.3  MCV 94.0  --  93.6 93.3 95.7  PLT 320  --  PLATELET CLUMPS NOTED ON SMEAR, UNABLE TO ESTIMATE [phone]   Basic Metabolic Panel: Recent Labs  Lab 08/22/22 1105 08/22/22 1214 08/22/22 2102 08/22/22 2154 08/23/22 0608 08/24/22 1045  NA 138 137 133* 132* 134* 138  K 2.7* 2.5* 3.1* 3.9 3.7 3.2*  CL 94*  --  96* 96* 96* 101  CO2 25  --  '26 23 27 27  '$ GLUCOSE 69*  --  72 70 131* 96  BUN 5*  --  <5* <5* <5* <5*  CREATININE 0.73  --  0.66 0.71 0.72 0.84  CALCIUM 8.3*  --  7.8* 7.8* 7.8* 8.4*  MG 1.6*  --  2.1  --  2.9* 2.0  PHOS  --   --  1.6*  --   --   --    GFR: Estimated Creatinine Clearance: 52.1 mL/min (by C-G formula based on SCr of 0.84 mg/dL). Liver Function Tests: Recent Labs  Lab 08/22/22 1105 08/22/22 2102 08/23/22 0608 08/24/22 1045  AST '17 24 22 '$ 37  ALT '8 13 9 14  '$ ALKPHOS 61 59 60 73  BILITOT 0.7 0.8 0.6 0.5  PROT 5.3* 4.7* 4.4* 5.3*  ALBUMIN 2.5* 1.9* 1.8* 2.1*      Code Status:  DNR.  Code status decision has been confirmed with: patient    Severity of Illness:  The appropriate patient status for  this patient is INPATIENT. Inpatient status is judged to be reasonable and necessary in order to provide the required intensity of service to ensure the patient's safety. The patient's presenting symptoms, physical exam findings, and initial radiographic and laboratory data in the context of their chronic comorbidities is felt to place them at high risk for further clinical deterioration. Furthermore, it is not anticipated that the patient will be medically stable for discharge from the hospital within 2 midnights of admission.   * I certify that at the point of admission it is my clinical judgment that the patient will require inpatient hospital care spanning beyond 2 midnights from the point of admission due to high intensity  of service, high risk for further deterioration and high frequency of surveillance required.*  Time spent:  60 minutes  Author:  Vernelle Emerald MD  08/24/2022 9:52 PM

## 2022-08-24 NOTE — Transfer of Care (Signed)
Immediate Anesthesia Transfer of Care Note  [name]  Procedure(s) Performed: ESOPHAGOGASTRODUODENOSCOPY (EGD)  Patient Location: Endoscopy Unit  Anesthesia Type:MAC  Level of Consciousness: awake, alert , and oriented  Airway & Oxygen Therapy: Patient Spontanous Breathing and Patient connected to face mask oxygen  Post-op Assessment: Report given to RN and Post -op Vital signs reviewed and stable  Post vital signs: Reviewed and stable  Last Vitals:  Vitals Value Taken Time  BP 94/32   Temp    Pulse 77   Resp 18 08/24/22 0758  SpO2 99%   Vitals shown include unvalidated device data.  Last Pain:  Vitals:   08/24/22 0714  TempSrc: Temporal  PainSc: 0-No pain         Complications: No notable events documented.

## 2022-08-25 ENCOUNTER — Telehealth: Payer: Self-pay

## 2022-08-25 ENCOUNTER — Inpatient Hospital Stay (HOSPITAL_COMMUNITY): Payer: Medicare Other

## 2022-08-25 DIAGNOSIS — R627 Adult failure to thrive: Secondary | ICD-10-CM | POA: Diagnosis not present

## 2022-08-25 LAB — COMPREHENSIVE METABOLIC PANEL
ALT: 15 U/L (ref 0–44)
AST: 41 U/L (ref 15–41)
Albumin: 1.5 g/dL — ABNORMAL LOW (ref 3.5–5.0)
Alkaline Phosphatase: 54 U/L (ref 38–126)
Anion gap: 7 (ref 5–15)
BUN: <5 mg/dL — ABNORMAL LOW (ref 8–23)
CO2: 26 mmol/L (ref 22–32)
Calcium: 7.8 mg/dL — ABNORMAL LOW (ref 8.9–10.3)
Chloride: 104 mmol/L (ref 98–111)
Creatinine, Ser: 0.9 mg/dL (ref 0.44–1.00)
GFR, Estimated: >60 mL/min (ref >60)
Glucose, Bld: 151 mg/dL — ABNORMAL HIGH (ref 70–99)
Potassium: 3.3 mmol/L — ABNORMAL LOW (ref 3.5–5.1)
Sodium: 137 mmol/L (ref 135–145)
Total Bilirubin: 0.5 mg/dL (ref 0.3–1.2)
Total Protein: 4 g/dL — ABNORMAL LOW (ref 6.5–8.1)

## 2022-08-25 LAB — GLUCOSE, CAPILLARY
Glucose-Capillary: 103 mg/dL — ABNORMAL HIGH (ref 70–99)
Glucose-Capillary: 114 mg/dL — ABNORMAL HIGH (ref 70–99)

## 2022-08-25 LAB — CBC WITH DIFFERENTIAL/PLATELET
Abs Immature Granulocytes: 0.02 10*3/uL (ref 0.00–0.07)
Basophils Absolute: 0.1 10*3/uL (ref 0.0–0.1)
Basophils Relative: 1 %
Eosinophils Absolute: 0.3 10*3/uL (ref 0.0–0.5)
Eosinophils Relative: 6 %
HCT: 32.7 % — ABNORMAL LOW (ref 36.0–46.0)
Hemoglobin: 11.1 g/dL — ABNORMAL LOW (ref 12.0–15.0)
Immature Granulocytes: 0 %
Lymphocytes Relative: 46 %
Lymphs Abs: 2.4 10*3/uL (ref 0.7–4.0)
MCH: 32.8 pg (ref 26.0–34.0)
MCHC: 33.9 g/dL (ref 30.0–36.0)
MCV: 96.7 fL (ref 80.0–100.0)
Monocytes Absolute: 0.8 10*3/uL (ref 0.1–1.0)
Monocytes Relative: 15 %
Neutro Abs: 1.7 10*3/uL (ref 1.7–7.7)
Neutrophils Relative %: 32 %
Platelets: 260 10*3/uL (ref 150–400)
RBC: 3.38 MIL/uL — ABNORMAL LOW (ref 3.87–5.11)
RDW: 17.8 % — ABNORMAL HIGH (ref 11.5–15.5)
WBC: 5.2 10*3/uL (ref 4.0–10.5)
nRBC: 0 % (ref 0.0–0.2)

## 2022-08-25 LAB — MAGNESIUM: Magnesium: 1.6 mg/dL — ABNORMAL LOW (ref 1.7–2.4)

## 2022-08-25 MED ORDER — POTASSIUM CHLORIDE CRYS ER 20 MEQ PO TBCR
40.0000 meq | EXTENDED_RELEASE_TABLET | Freq: Once | ORAL | Status: AC
  Administered 2022-08-25: 40 meq via ORAL
  Filled 2022-08-25: qty 2

## 2022-08-25 MED ORDER — MAGNESIUM SULFATE 2 GM/50ML IV SOLN
2.0000 g | Freq: Once | INTRAVENOUS | Status: AC
  Administered 2022-08-25: 2 g via INTRAVENOUS
  Filled 2022-08-25: qty 50

## 2022-08-25 MED ORDER — PANTOPRAZOLE SODIUM 40 MG PO TBEC
40.0000 mg | DELAYED_RELEASE_TABLET | Freq: Every day | ORAL | Status: DC
  Administered 2022-08-26 – 2022-08-31 (×6): 40 mg via ORAL
  Filled 2022-08-25 (×6): qty 1

## 2022-08-25 NOTE — TOC Initial Note (Signed)
Transition of Care [facility]) - Initial/Assessment Note    Patient Details  Name: Donna Bailey MRN: [id number] Date of Birth: 1946-01-12  Transition of [facility]) CM/SW Contact:    Vassie Moselle, LCSW Phone Number: 08/25/2022, 1:45 PM  Clinical Narrative:                 Met with pt and confirmed plan for SNF placement. Pt shares she has not had SNF in the past however, her husband was recently discharged from [facility] and this would be her top choice for placement. Referrals have been faxed out for SNF placement and currently awaiting bed offers.  CSW listened to pt as she shared her experiences with going to [facility] and struggles she has been experiencing.     Expected Discharge Plan: Skilled Nursing Facility Barriers to Discharge: Continued Medical Work up   Patient Goals and CMS Choice Patient states their goals for this hospitalization and ongoing recovery are:: To go to SNF CMS Medicare.gov Compare Post Acute Care list provided to:: Patient Choice offered to / list presented to : Patient [place] ownership interest in [facility]provided to:: Patient    Expected Discharge Plan and Services In-house Referral: Clinical Social Work Discharge Planning Services: CM Consult Post Acute Care Choice: [place] Living arrangements for the past 2 months: Single Family Home                 DME Arranged: N/A DME Agency: NA                  Prior Living Arrangements/Services Living arrangements for the past 2 months: Single Family Home Lives with:: Spouse Patient language and need for interpreter reviewed:: Yes Do you feel safe going back to the place where you live?: Yes      Need for Family Participation in Patient Care: No (Comment) Care giver support system in place?: No (comment) Current home services: DME Criminal Activity/Legal Involvement Pertinent to Current Situation/Hospitalization: No - Comment as needed  Activities of  Daily Living   ADL Screening (condition at time of admission) Is the patient deaf or have difficulty hearing?: No Does the patient have difficulty seeing, even when wearing glasses/contacts?: No Does the patient have difficulty concentrating, remembering, or making decisions?: No Does the patient have difficulty dressing or bathing?: Yes Does the patient have difficulty walking or climbing stairs?: Yes  Permission Sought/Granted Permission sought to share information with : Facility Sport and exercise psychologist, Family Supports Permission granted to share information with : Yes, Verbal Permission Granted  Share Information with NAME: Dynver Clemson  Permission granted to share info w AGENCY: SNF's  Permission granted to share info w Relationship: Spouse  Permission granted to share info w Contact Information: [phone]  Emotional Assessment Appearance:: Appears older than stated age Attitude/Demeanor/Rapport: Engaged Affect (typically observed): Accepting Orientation: : Oriented to Self, Oriented to Place, Oriented to  Time, Oriented to Situation Alcohol / Substance Use: Not Applicable Psych Involvement: No (comment)  Admission diagnosis:  Hypokalemia [E87.6] Hypomagnesemia [E83.42] Dysphagia [R13.10] Generalized weakness [R53.1] Patient Active Problem List   Diagnosis Date Noted   Loss of weight 08/24/2022   Early satiety 08/24/2022   Fundic gland polyps of stomach, benign 08/24/2022   Generalized weakness 08/23/2022   Normocytic anemia 08/23/2022   Chronic diastolic CHF (congestive heart failure) ([place]) 08/23/2022   Ketosis ([place]) 08/23/2022   Hypokalemia due to inadequate potassium intake 08/23/2022   Hypomagnesemia 08/23/2022   Protein-calorie malnutrition, severe ([place])  08/23/2022   Failure to thrive in adult 08/23/2022   History of vertebral fracture 06/10/2022   Myalgia due to statin 06/04/2022   Falls 02/24/2022   Lumbar spondylosis 02/24/2022   PAD (peripheral artery  disease) ([place]) 11/25/2021   Pain of left hand 10/30/2021   Bilateral lower extremity edema 09/25/2021   Acute bronchospasm 09/02/2021   Acute stress disorder 09/02/2021   Amnesia 09/02/2021   Atrophic vaginitis 09/02/2021   Candidiasis 09/02/2021   Chronic kidney disease, stage 3a ([place]) 09/02/2021   Dysphagia 09/02/2021   Hereditary and idiopathic neuropathy, unspecified 09/02/2021   History of migraine 09/02/2021   History of multiple allergies 09/02/2021   Hypotension 09/02/2021   Labile blood pressure 09/02/2021   Mild intermittent asthma 09/02/2021   Other long term (current) drug therapy 09/02/2021   Other specified disorders of bone density and structure, other site 09/02/2021   Overactive bladder 09/02/2021   Pancreatic insufficiency 09/02/2021   Pernicious anemia 09/02/2021   Recurrent cystitis 09/02/2021   Recurrent falls 09/02/2021   Rosacea 09/02/2021   Tinnitus of left ear 09/02/2021   Tremor 09/02/2021   Unspecified abnormal finding in specimens from other organs, systems and tissues 09/02/2021   Upset stomach 09/02/2021   Vitamin B12 deficiency 09/02/2021   Vitamin D deficiency 09/02/2021   Dizziness 08/23/2021   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   Chest pain 05/23/2021   Essential hypertension 05/23/2021   AKI (acute kidney injury) ([place]) 05/23/2021   Other fatigue 05/22/2021   Incontinence of feces 05/15/2021   Pelvic floor weakness 05/15/2021   Snores 05/02/2021   Abdominal pain, epigastric 11/28/2020   Change in bowel habits 11/28/2020   Chronic venous insufficiency 10/23/2020   Acute recurrent pansinusitis 08/02/2019   Dental infection 08/02/2019   Dry skin 07/07/2019   Allergic reaction 06/09/2019   Chronic rhinitis 06/09/2019   Adverse food reaction 06/09/2019   Multiple drug allergies 06/09/2019   Pain in right foot 05/25/2019   Right arm pain 04/25/2019   Sore in mouth 04/01/2019   Contusion of right knee 12/23/2018   Close exposure to  COVID-19 virus 11/05/2018   Pain of left heel 09/10/2018   Pain in left knee 08/18/2018   Bronchitis, acute 04/15/2018   Upper respiratory infection, acute 04/15/2018   Impingement syndrome of right shoulder region 02/01/2018   Coronary artery disease involving native coronary artery of native heart without angina pectoris 01/22/2018   Hematoma of arm, right, sequela 01/05/2018   Ischemic cardiomyopathy 01/05/2018   Migraine 11/13/2017   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 11/13/2017   Post-traumatic arthritis of ankle, right 12/22/2016   Biceps tendinopathy, right 11/09/2016   Subacromial impingement, right 11/09/2016   Partial nontraumatic tear of rotator cuff, right 11/09/2016   Pelvic organ prolapse quantification stage 3 cystocele 11/26/2015   Cerebral aneurysm 10/31/2015   Obesity (BMI 30-39.9)    Erosive esophagitis 02/23/2013   Hyperlipidemia LDL goal <70 08/26/2011   Tinea 08/26/2011   Food intolerance 03/25/2011   Personal history of colonic polyps 05/15/2010   Asthma with bronchitis 03/28/2010   Seasonal and perennial allergic rhinitis 11/29/2007   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 [place] [address] Phone: [phone] Fax: [phone]     Social Determinants of Health (SDOH) Social History: SDOH Screenings   Depression (PHQ2-9): Low Risk  (03/15/2020)  Tobacco Use: Low Risk  (08/24/2022)   SDOH Interventions:     Readmission Risk Interventions    11/27/2021   11:39 AM  Readmission Risk Prevention Plan  Transportation Screening Complete  PCP or Specialist Appt within 5-7 Days Complete  Home  Care Screening Complete  Medication Review (RN CM) Complete

## 2022-08-25 NOTE — Telephone Encounter (Signed)
Procedure cancelled as requested.

## 2022-08-25 NOTE — Consult Note (Signed)
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   acetaminophen (TYLENOL) suppository 650 mg  650 mg Rectal Q6H PRN Gatha Mayer, MD       albuterol (PROVENTIL) (2.5 MG/3ML) 0.083% nebulizer solution 2.5 mg  2.5 mg Nebulization Q2H PRN Gatha Mayer, MD       alum & mag hydroxide-simeth (MAALOX/MYLANTA) 200-200-20 MG/5ML suspension 30 mL  30 mL Oral Q6H PRN Gatha Mayer, MD       amitriptyline (ELAVIL) tablet 25 mg  25 mg Oral QHS Gatha Mayer, MD   25 mg at 08/24/22 2201   aspirin chewable tablet 81 mg  81 mg Oral Daily Gatha Mayer, MD   81 mg at 08/25/22 8099    cyanocobalamin (VITAMIN B12) injection  1,000 mcg  1,000 mcg Intramuscular Q Wonda Horner, Ofilia Neas, MD   1,000 mcg at 08/24/22 1024   feeding supplement (BOOST / RESOURCE BREEZE) liquid 1 Container  1 Container Oral TID BM Shalhoub, Sherryll Burger, MD   1 Container at 70/62/37 6283   folic acid (FOLVITE) tablet 1 mg  1 mg Oral Daily Shalhoub, Sherryll Burger, MD   1 mg at 08/25/22 0920   heparin injection 5,000 Units  5,000 Units Subcutaneous Q8H Gatha Mayer, MD   5,000 Units at 08/25/22 1517   lidocaine (LIDODERM) 5 % 2 patch  2 patch Transdermal Q24H Gatha Mayer, MD   2 patch at 08/25/22 1024   mirtazapine (REMERON) tablet 7.5 mg  7.5 mg Oral QHS Gatha Mayer, MD   7.5 mg at 08/24/22 2201   multivitamin liquid 15 mL  15 mL Oral Daily Gatha Mayer, MD   15 mL at 08/25/22 1024   nitroGLYCERIN (NITROSTAT) SL tablet 0.4 mg  0.4 mg Sublingual Q5 min PRN Gatha Mayer, MD       ondansetron [facility]) tablet 4 mg  4 mg Oral Q6H PRN Gatha Mayer, MD       Or   ondansetron [facility]) injection 4 mg  4 mg Intravenous Q6H PRN Gatha Mayer, MD   4 mg at 08/25/22 6160   oxyCODONE (Oxy IR/ROXICODONE) immediate release tablet 5 mg  5 mg Oral Q4H PRN Gatha Mayer, MD   5 mg at 08/25/22 0920   pantoprazole (PROTONIX) injection 40 mg  40 mg Intravenous Q24H Gatha Mayer, MD   40 mg at 08/25/22 0920    Musculoskeletal: Strength & Muscle Tone: decreased Gait & Station:  lying down Patient leans: N/A  Psychiatric Specialty Exam:  Presentation  General Appearance: Appropriate for Environment; Fairly Groomed; Other (comment) (wearing toboggan, socks and shoes in bed.)  Eye Contact:Minimal  Speech:Clear and Coherent; Normal Rate  Speech Volume:Normal  Handedness:Right   Mood and Affect  Mood:Anxious  Affect:Appropriate; Congruent   Thought Process  Thought Processes:Coherent; Linear  Descriptions of Associations:Circumstantial  Orientation:Full (Time, Place and Person)  Thought  Content:Logical; Tangential  History of Schizophrenia/Schizoaffective disorder:No data recorded Duration of Psychotic Symptoms:No data recorded Hallucinations:Hallucinations: None  Ideas of Reference:None  Suicidal Thoughts:Suicidal Thoughts: No  Homicidal Thoughts:Homicidal Thoughts: No   Sensorium  Memory:Remote Good; Recent Good; Immediate Fair  Judgment:Good  Insight:Good   Executive Functions  Concentration:Good  Attention Span:Good  [place] of Knowledge:Good  Language:Good   Psychomotor Activity  Psychomotor Activity:Psychomotor Activity: Normal   Assets  Assets:Communication Skills; Leisure Time; Resilience; Social Support; Housing; Financial Resources/Insurance   Sleep  Sleep:Sleep: Poor   Physical Exam: Physical Exam Constitutional:      Appearance: Normal appearance.  HENT:     Head: Normocephalic.  Skin:    General: Skin is warm and dry.  Neurological:     General: No focal deficit present.     Mental Status: She is alert and oriented to person, place, and time. Mental status is at baseline.  Psychiatric:        Attention and Perception: Attention and perception normal.        Mood and Affect: Mood and affect normal.        Speech: Speech normal.        Behavior: Behavior normal. Behavior is cooperative.        Thought Content: Thought content normal.        Cognition and  Memory: Cognition and memory normal.        Judgment: Judgment normal.    ROS Blood pressure (!) 114/59, Bailey 92, temperature 98.4 F (36.9 C), temperature source Oral, resp. rate 16, height '5\' 5"'$  (1.651 m), weight 64.4 kg, SpO2 92 %. Body mass index is 23.63 kg/m.  Treatment Plan Summary: Daily contact with patient to assess and evaluate symptoms and progress in treatment, Medication management, and Plan    Will add on urine drug screen. -Will suggest COWS protocol/clonidine detox.  Patient currently not presenting with any withdrawal symptoms however  endorses being on cough medication for quite some time and it is something we need to consider going forward. -Consider low-dose trazodone or hydroxyzine for insomnia.  Currently prescribed Ambien for insomnia, however advised against this during hospitalization stay. -Consider initiation of delirium precautions due to age and multiple risk factors. -Continue current recommendations from nutrition/dietary and encourage food choices at each meal.  Psychiatry consult service will continue to follow at this time.  There are no immediate or foreseeable barriers to discharge. Disposition: No evidence of imminent risk to self or others at present.   Patient does not meet criteria for psychiatric inpatient admission. Supportive therapy provided about ongoing stressors. Discussed crisis plan, support from social network, calling 911, coming to the Emergency Department, and calling Suicide Hotline.  Suella Broad, FNP 08/25/2022 11:09 AM

## 2022-08-25 NOTE — Evaluation (Signed)
Occupational Therapy Evaluation Patient Details Name: Donna Bailey MRN: [id number] DOB: [birth year] Today's Date: 08/25/2022   History of Present Illness Patient is a 77 year old female who presented on 1/19 with decreased oral intake, nausea and vomiting. Patient was admitted with hypoglycemia, hypokalemia, and hypomagnesia and concerns for starvation ketosis.patient underwent EGD on 1/21. PMH:CAD, chronic diastolic CHF, thoracic compression fx T11-L4 07/2022, peripheral neuropathy, MI.   Clinical Impression   Patient is a 77 year old female who was admitted for above. Patient was living at home with husband with back pain impacting participation in ADLs PLOF. Patient currently has increased back pain, limited ROM movement due to pain levels, poor activity tolerance and endurance with increased A needed for ADL tasks. Patient was very talkative during session reporting that pain medication makes her more talkative. Patient would continue to benefit from skilled OT services at this time while admitted and after d/c to address noted deficits in order to improve overall safety and independence in ADLs.       Recommendations for follow up therapy are one component of a multi-disciplinary discharge planning process, led by the attending physician.  Recommendations may be updated based on patient status, additional functional criteria and insurance authorization.   Follow Up Recommendations  Skilled nursing-short term rehab (<3 hours/day)     Assistance Recommended at Discharge Frequent or constant Supervision/Assistance  Patient can return home with the following A lot of help with bathing/dressing/bathroom;Assistance with cooking/housework;Direct supervision/assist for medications management;Assist for transportation;Help with stairs or ramp for entrance;Direct supervision/assist for financial management;A lot of help with walking and/or transfers    Functional Status Assessment  Patient  has had a recent decline in their functional status and demonstrates the ability to make significant improvements in function in a reasonable and predictable amount of time.  Equipment Recommendations  None recommended by OT    Recommendations for Other Services       Precautions / Restrictions Precautions Precautions: Fall Precaution Comments: denies falls in past 6 months  (had one 11 months ago) Restrictions Weight Bearing Restrictions: No      Mobility Bed Mobility Overal bed mobility: Needs Assistance Bed Mobility: Supine to Sit, Sit to Supine     Supine to sit: Mod assist Sit to supine: Min assist   General bed mobility comments: mod A to raise trunk; min A for BLEs into bed    Transfers                          Balance Overall balance assessment: Needs assistance   Sitting balance-Leahy Scale: Fair     Standing balance support: Bilateral upper extremity supported Standing balance-Leahy Scale: Poor                             ADL either performed or assessed with clinical judgement   ADL Overall ADL's : Needs assistance/impaired Eating/Feeding: Modified independent;Bed level Eating/Feeding Details (indicate cue type and reason): patient noted to pick up water glass and take sips while lying in supine with patient reporting she drinks like this at home. patient not receptive to education on sitting up to drink. Grooming: Bed level;Moderate assistance Grooming Details (indicate cue type and reason): pain in BUE Upper Body Bathing: Bed level;Moderate assistance   Lower Body Bathing: Bed level;Total assistance   Upper Body Dressing : Bed level;Moderate assistance   Lower Body Dressing: Bed level;Total assistance  Toilet Transfer: Minimal assistance;Ambulation Toilet Transfer Details (indicate cue type and reason): HHA to transfer from EOB to 3 in 1 commode at bedside. patient was able to take a few steps to bench in room sit down and  walk back to bed with increased time. no LOB noted. Toileting- Water quality scientist and Hygiene: Min guard;Sitting/lateral lean Toileting - Clothing Manipulation Details (indicate cue type and reason): on commode       General ADL Comments: patient declined to use RW reporting that she trips over it all the time. patient requested to use rollator, wheelchair backwards, rolling cart or two canes side by side instead of RW. rollator not availabel at this time. other options patient was educated were not the safest way to participate in functional mobility.     Vision Patient Visual Report: No change from baseline       Perception     Praxis      Pertinent Vitals/Pain Pain Assessment Pain Assessment: 0-10 Pain Score: 10-Worst pain ever Faces Pain Scale: Hurts even more Pain Location: where i hurt my back in 4 places, up in my neck and both shoulders, from elbows to shoulder and neck Pain Descriptors / Indicators: Constant Pain Intervention(s): Limited activity within patient's tolerance, Premedicated before session, Monitored during session, Repositioned     Hand Dominance Right   Extremity/Trunk Assessment Upper Extremity Assessment Upper Extremity Assessment: Generalized weakness (patient reported pain from elbows to shoulders into neck bilaterally. able to use functionally declined MMT or ROM formal assessment.)   Lower Extremity Assessment Lower Extremity Assessment: Defer to PT evaluation   Cervical / Trunk Assessment Cervical / Trunk Assessment: Kyphotic   Communication Communication Communication: No difficulties   Cognition Arousal/Alertness: Awake/alert Behavior During Therapy: Anxious Overall Cognitive Status: No family/caregiver present to determine baseline cognitive functioning                                 General Comments: able to follow commands, difficult to maintain focus on task at hand, self limiting with mobility, very talkative      General Comments   Patient was very talkative with covering multiple topics during session with reason session was so long being related to talking. Patient spoke about family dynamic, husband getting out of SNF recently, heart surgery, and her relationship with god. Patient was difficult to redirect.     Exercises     Shoulder Instructions      Home Living Family/patient expects to be discharged to:: Private residence Living Arrangements: Spouse/significant other Available Help at Discharge: Family Type of Home: House Home Access: Ramped entrance;Stairs to enter Entrance Stairs-Number of Steps: 7 Entrance Stairs-Rails: Right Home Layout: Able to live on main level with bedroom/bathroom     Bathroom Shower/Tub: Teacher, early years/pre: Standard     Home Equipment: Conservation officer, nature (2 wheels);Rollator (4 wheels);Wheelchair - manual   Additional Comments: lives with spouse who just came home from SNF      Prior Functioning/Environment Prior Level of Function : Needs assist             Mobility Comments: walks with rollator, denied falls in past 6 months ADLs Comments: stated she's not been able to transfer into tub recently tried to use a tub transfer bench with patient reporting bathroom is too smal.        OT Problem List: Decreased activity tolerance;Impaired balance (sitting and/or standing);Decreased coordination;Decreased safety awareness;Decreased  knowledge of precautions;Decreased knowledge of use of DME or AE;Impaired UE functional use      OT Treatment/Interventions: Self-care/ADL training;Energy conservation;Therapeutic exercise;DME and/or AE instruction;Patient/family education;Therapeutic activities;Balance training    OT Goals(Current goals can be found in the care plan section) Acute Rehab OT Goals Patient Stated Goal: to get stronger for surgery OT Goal Formulation: With patient Time For Goal Achievement: 09/08/22 Potential to Achieve Goals:  Fair  OT Frequency: Min 2X/week       AM-PAC OT "6 Clicks" Daily Activity     Outcome Measure Help from another person eating meals?: A Little Help from another person taking care of personal grooming?: A Little Help from another person toileting, which includes using toliet, bedpan, or urinal?: A Lot Help from another person bathing (including washing, rinsing, drying)?: A Lot Help from another person to put on and taking off regular upper body clothing?: A Little Help from another person to put on and taking off regular lower body clothing?: A Lot 6 Click Score: 15   End of Session Equipment Utilized During Treatment: Gait belt Nurse Communication: Other (comment) (patients request to change consistency of food.)  Activity Tolerance: Patient limited by pain Patient left: in bed;with call bell/phone within reach;Other (comment) (nurse supervisor)  OT Visit Diagnosis: Unsteadiness on feet (R26.81);Other abnormalities of gait and mobility (R26.89);Muscle weakness (generalized) (M62.81)                Time: 2706-2376 OT Time Calculation (min): 53 min Charges:  OT General Charges $OT Visit: 1 Visit OT Evaluation $OT Eval Low Complexity: 1 Low OT Treatments $Self Care/Home Management : 38-52 mins  Ruthanna Macchia OTR/L, MS Acute Rehabilitation Department Office# [phone]   Willa Rough 08/25/2022, 4:16 PM

## 2022-08-25 NOTE — NC FL2 (Signed)
[place] LEVEL OF CARE FORM     IDENTIFICATION  Patient Name: Donna Bailey Birthdate: 08/12/1945 Sex: female Admission Date (Current Location): 08/22/2022  [facility] and [place] Number:  Herbalist and Address:  [facility],  [place], [place]      Provider Number: [id number]  Attending Physician Name and Address:  Antonieta Pert, MD  Relative Name and Phone Number:       Current Level of Care: Hospital Recommended Level of Care: [place] Prior Approval Number:    Date Approved/Denied:   PASRR Number: [id number]  Discharge Plan: SNF    Current Diagnoses: Patient Active Problem List   Diagnosis Date Noted   Loss of weight 08/24/2022   Early satiety 08/24/2022   Fundic gland polyps of stomach, benign 08/24/2022   Generalized weakness 08/23/2022   Normocytic anemia 08/23/2022   Chronic diastolic CHF (congestive heart failure) ([place]) 08/23/2022   Ketosis ([place]) 08/23/2022   Hypokalemia due to inadequate potassium intake 08/23/2022   Hypomagnesemia 08/23/2022   Protein-calorie malnutrition, severe ([place]) 08/23/2022   Failure to thrive in adult 08/23/2022   History of vertebral fracture 06/10/2022   Myalgia due to statin 06/04/2022   Falls 02/24/2022   Lumbar spondylosis 02/24/2022   PAD (peripheral artery disease) ([place]) 11/25/2021   Pain of left hand 10/30/2021   Bilateral lower extremity edema 09/25/2021   Acute bronchospasm 09/02/2021   Acute stress disorder 09/02/2021   Amnesia 09/02/2021   Atrophic vaginitis 09/02/2021   Candidiasis 09/02/2021   Chronic kidney disease, stage 3a ([place]) 09/02/2021   Dysphagia 09/02/2021   Hereditary and idiopathic neuropathy, unspecified 09/02/2021   History of migraine 09/02/2021   History of multiple allergies 09/02/2021   Hypotension 09/02/2021   Labile blood pressure 09/02/2021   Mild intermittent asthma 09/02/2021   Other long term (current) drug  therapy 09/02/2021   Other specified disorders of bone density and structure, other site 09/02/2021   Overactive bladder 09/02/2021   Pancreatic insufficiency 09/02/2021   Pernicious anemia 09/02/2021   Recurrent cystitis 09/02/2021   Recurrent falls 09/02/2021   Rosacea 09/02/2021   Tinnitus of left ear 09/02/2021   Tremor 09/02/2021   Unspecified abnormal finding in specimens from other organs, systems and tissues 09/02/2021   Upset stomach 09/02/2021   Vitamin B12 deficiency 09/02/2021   Vitamin D deficiency 09/02/2021   Dizziness 08/23/2021   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   Chest pain 05/23/2021   Essential hypertension 05/23/2021   AKI (acute kidney injury) ([place]) 05/23/2021   Other fatigue 05/22/2021   Incontinence of feces 05/15/2021   Pelvic floor weakness 05/15/2021   Snores 05/02/2021   Abdominal pain, epigastric 11/28/2020   Change in bowel habits 11/28/2020   Chronic venous insufficiency 10/23/2020   Acute recurrent pansinusitis 08/02/2019   Dental infection 08/02/2019   Dry skin 07/07/2019   Allergic reaction 06/09/2019   Chronic rhinitis 06/09/2019   Adverse food reaction 06/09/2019   Multiple drug allergies 06/09/2019   Pain in right foot 05/25/2019   Right arm pain 04/25/2019   Sore in mouth 04/01/2019   Contusion of right knee 12/23/2018   Close exposure to COVID-19 virus 11/05/2018   Pain of left heel 09/10/2018   Pain in left knee 08/18/2018   Bronchitis, acute 04/15/2018   Upper respiratory infection, acute 04/15/2018   Impingement syndrome of right shoulder region 02/01/2018   Coronary artery disease involving native coronary artery of native heart without angina pectoris  01/22/2018   Hematoma of arm, right, sequela 01/05/2018   Ischemic cardiomyopathy 01/05/2018   Migraine 11/13/2017   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 11/13/2017   Post-traumatic arthritis of ankle, right 12/22/2016   Biceps tendinopathy, right 11/09/2016    Subacromial impingement, right 11/09/2016   Partial nontraumatic tear of rotator cuff, right 11/09/2016   Pelvic organ prolapse quantification stage 3 cystocele 11/26/2015   Cerebral aneurysm 10/31/2015   Obesity (BMI 30-39.9)    Erosive esophagitis 02/23/2013   Hyperlipidemia LDL goal <70 08/26/2011   Tinea 08/26/2011   Food intolerance 03/25/2011   Personal history of colonic polyps 05/15/2010   Asthma with bronchitis 03/28/2010   Seasonal and perennial allergic rhinitis 11/29/2007   TMJ SYNDROME 11/29/2007   GERD 11/29/2007   HIATAL HERNIA 11/29/2007   DEGENERATIVE JOINT DISEASE 11/29/2007   Fibromyalgia 11/29/2007   SCOLIOSIS 11/29/2007   Insomnia 11/29/2007   Myalgia and myositis 11/29/2007   Barrett esophagus     Orientation RESPIRATION BLADDER Height & Weight     Self, Time, Situation, Place  Normal Continent Weight: 141 lb 15.6 oz (64.4 kg) Height:  '5\' 5"'$  (165.1 cm)  BEHAVIORAL SYMPTOMS/MOOD NEUROLOGICAL BOWEL NUTRITION STATUS      Continent Diet (Regular)  AMBULATORY STATUS COMMUNICATION OF NEEDS Skin   Limited Assist Verbally Normal                       Personal Care Assistance Level of Assistance  Bathing, Feeding, Dressing Bathing Assistance: Limited assistance Feeding assistance: Independent Dressing Assistance: Limited assistance     Functional Limitations Info  Sight, Hearing, Speech Sight Info: Adequate Hearing Info: Adequate Speech Info: Adequate    SPECIAL CARE FACTORS FREQUENCY  PT (By licensed PT), OT (By licensed OT)     PT Frequency: 5x/wk OT Frequency: 5x/wk            Contractures Contractures Info: Not present    Additional Factors Info  Code Status, Allergies Code Status Info: DNR Allergies Info: Cephalosporins, Crestor (Rosuvastatin), Doxycycline, Formoterol, Other, Sulfa Antibiotics, Sulfonamide Derivatives, Ciprofloxacin, Nitrofurantoin, Penicillins, Prednisone, Amlodipine, Caffeine, Carvedilol, Cephalexin, Dilt-xr  (Diltiazem Hcl), Esomeprazole, Formoterol Fumarate, Fosfomycin, Gold Sodium Thiosulfate, Gold-containing Drug Products, Hydralazine, Levofloxacin In D5w, Macrodantin (Nitrofurantoin Macrocrystal), Moxifloxacin, Neomycin, Ofloxacin, Praluent (Alirocumab), Pregabalin, Ranexa (Ranolazine Er), Repatha (Evolocumab), Valsartan, Welchol (Colesevelam), Zetia (Ezetimibe), Zolpidem, Acetaminophen, Fexofenadine, Ibuprofen, Latex, Levofloxacin, Mold Extract (Trichophyton Mentagrophyte), Molds & Smuts, Nickel, Tape, Trichophyton           Current Medications (08/25/2022):  This is the current hospital active medication list Current Facility-Administered Medications  Medication Dose Route Frequency Provider Last Rate Last Admin   acetaminophen (TYLENOL) tablet 650 mg  650 mg Oral Q6H PRN Gatha Mayer, MD   650 mg at 08/24/22 1023   Or   acetaminophen (TYLENOL) suppository 650 mg  650 mg Rectal Q6H PRN Gatha Mayer, MD       albuterol (PROVENTIL) (2.5 MG/3ML) 0.083% nebulizer solution 2.5 mg  2.5 mg Nebulization Q2H PRN Gatha Mayer, MD       alum & mag hydroxide-simeth (MAALOX/MYLANTA) 200-200-20 MG/5ML suspension 30 mL  30 mL Oral Q6H PRN Gatha Mayer, MD       amitriptyline (ELAVIL) tablet 25 mg  25 mg Oral QHS Gatha Mayer, MD   25 mg at 08/24/22 2201   aspirin chewable tablet 81 mg  81 mg Oral Daily Gatha Mayer, MD   81 mg at  08/25/22 5638   cyanocobalamin (VITAMIN B12) injection 1,000 mcg  1,000 mcg Intramuscular Q Wonda Horner, Ofilia Neas, MD   1,000 mcg at 08/24/22 1024   feeding supplement (BOOST / RESOURCE BREEZE) liquid 1 Container  1 Container Oral TID BM Shalhoub, Sherryll Burger, MD   1 Container at 93/73/42 8768   folic acid (FOLVITE) tablet 1 mg  1 mg Oral Daily Shalhoub, Sherryll Burger, MD   1 mg at 08/25/22 0920   heparin injection 5,000 Units  5,000 Units Subcutaneous Q8H Gatha Mayer, MD   5,000 Units at 08/25/22 1319   lidocaine (LIDODERM) 5 % 2 patch  2 patch Transdermal Q24H Gatha Mayer, MD   2 patch at 08/25/22 1024   mirtazapine (REMERON) tablet 7.5 mg  7.5 mg Oral QHS Gatha Mayer, MD   7.5 mg at 08/24/22 2201   multivitamin liquid 15 mL  15 mL Oral Daily Gatha Mayer, MD   15 mL at 08/25/22 1024   nitroGLYCERIN (NITROSTAT) SL tablet 0.4 mg  0.4 mg Sublingual Q5 min PRN Gatha Mayer, MD       ondansetron [facility]) tablet 4 mg  4 mg Oral Q6H PRN Gatha Mayer, MD       Or   ondansetron [facility]) injection 4 mg  4 mg Intravenous Q6H PRN Gatha Mayer, MD   4 mg at 08/25/22 1157   oxyCODONE (Oxy IR/ROXICODONE) immediate release tablet 5 mg  5 mg Oral Q4H PRN Gatha Mayer, MD   5 mg at 08/25/22 0920   [START ON 08/26/2022] pantoprazole (PROTONIX) EC tablet 40 mg  40 mg Oral Daily Eudelia Bunch, [facility]         Discharge Medications: Please see discharge summary for a list of discharge medications.  Relevant Imaging Results:  Relevant Lab Results:   Additional Information SSN: [id number]  Vassie Moselle, LCSW

## 2022-08-25 NOTE — Plan of Care (Signed)

## 2022-08-25 NOTE — Progress Notes (Addendum)
PROGRESS NOTE Donna Bailey  VEH:[id number] DOB: 1945/10/20 DOA: 08/22/2022 PCP: [name], MD   Brief Narrative/Hospital Course: [phone] W/ short segment Barrett's esophagus (seen on EGD 01/2021),  pancreatic atrophy (seen on CTA 06/2021), CAD S/ STEMI with LAD PCI 9/47/6546 with cath complicated by radial artery dissection and hematoma, ischemic cardiomyopathy with systolic and diastolic heart failure(Echo 07/2021 EF 60-65% with G1DD, recovered EF from 40% in the past) presented with complaints of continued nausea vomiting and inability to tolerate oral intake. In ED:was found to be hypoglycemic as well as hypokalemia, hypomagnesemia  w/ acidosis with concerns for starvation ketosis with an elevated beta hydroxybutyrate.  Patient was admitted for further management of electrolyte imbalance starvation ketosis and failure to thrive S/p intravenous volume resuscitation and electrolyte replacement, GI has been consulted> underwent EGD> and CT abdomen pelvis without new abnormality and no signs of GI problem to explain-DVT and GI signed off. Due to concern for psychiatric cause of patient's aversion to food psychiatry has been consulted    Subjective: Seen and examined this morning.  Psychiatry at the bedside. Patient complains of burning sensation in her throat and the epigastric area reports Pepto-Bismol Would Help or Cherry Flavored Pepto-Bismol Liquid .  There is patient particular about flavor and medication does not want to try tablet Pepto-Bismol overnight afebrile BP stable, on room air Overnight labs showed potassium 3.3 mag 1.6 stable CBC, B12 4000 folate 5.3 normal low iron and elevated ferritin.   Assessment and Plan: Principal Problem:   Failure to thrive in adult Active Problems:   Ketosis (HCC)   Protein-calorie malnutrition, severe (HCC)   Hypokalemia due to inadequate potassium intake   Hypomagnesemia   Coronary artery disease involving native coronary artery of native  heart without angina pectoris   Chronic diastolic CHF (congestive heart failure) (HCC)   Normocytic anemia   Dysphagia   Generalized weakness   Loss of weight   Early satiety   Fundic gland polyps of stomach, benign   Adult failure to thrive Starvation ketoacidosis Inadequate oral intake: Patient underwent extensive workup with GI evaluation, EGD that is unchanged from EGD from 2022, CT of the abdomen and pelvis with IV and oral contrast unrevealing for any cause of patient's presentation.  Wondering if psychiatric she is contributing to her symptoms psychiatry has been consulted.  On conversation with her she has aversion to food, very particular about flavor and try medication that she can take.  Wants to avoid any medication that has corn derivative in it believing that it may have aflatoxin in it.  Continue PPI, Lipidem patch, add Pepto-Bismol.  Continue Elavil boost supplement multivitamin Remeron bedtime.  Dietitian following closely encourage oral intake  Hypokalemia Hypomagnesemia: 2/2 poor po intake. Monitor and replete Recent Labs  Lab 08/22/22 1105 08/22/22 1214 08/22/22 2102 08/22/22 2154 08/23/22 0608 08/24/22 1045 08/25/22 0731  K 2.7*   < > 3.1* 3.9 3.7 3.2* 3.3*  CALCIUM 8.3*  --  7.8* 7.8* 7.8* 8.4* 7.8*  MG 1.6*  --  2.1  --  2.9* 2.0 1.6*  PHOS  --   --  1.6*  --   --   --   --    < > = values in this interval not displayed.    CAD: No chest pain continue aspirin 81 Chronic diastolic CHF: Currently euvolemic.  BNP elevated in 800s. But not symptomatic. Check cxr. Monitor fluid status/daily weight. Net IO Since Admission: 2,580.44 mL [08/25/22 1328]  [employer]  08/22/22 1829 08/25/22 0500  Weight: 59 kg 64.4 kg    Normocytic anemia: Iron B12 ferritin elevated.  Hemoglobin stable  Inadequate oral intake:Dietitian input appreciated no malnutrition currently moderate risk Nutrition Problem: Inadequate oral intake Etiology: nausea,  vomiting Signs/Symptoms: per patient/family report Interventions: Ensure Enlive (each supplement provides 350kcal and 20 grams of protein), MVI DVT prophylaxis: heparin injection 5,000 Units Start: 08/22/22 2200 Code Status:   Code Status: DNR Family Communication: plan of care discussed with patient at bedside. Patient status is: Inpatient because of ongoing evaluation for FTT Level of care: Med-Surg   Dispo: The patient is from: home            Anticipated disposition: SNF Objective: Vitals last 24 hrs: Vitals:   08/24/22 1513 08/24/22 2048 08/25/22 0455 08/25/22 0500  BP: (!) 115/57 110/63 (!) 114/59   Pulse: 95 95 92   Resp: '18 16 16   '$ Temp: 97.8 F (36.6 C) 98.2 F (36.8 C) 98.4 F (36.9 C)   TempSrc: Oral Oral Oral   SpO2: 95% 96% 92%   Weight:    64.4 kg  Height:       Weight change:   Physical Examination: General exam: alert awake, older than stated age HEENT:Oral mucosa moist, Ear/Nose WNL grossly Respiratory system: bilaterally clear BS, no use of accessory muscle Cardiovascular system: S1 & S2 +, No JVD. Gastrointestinal system: Abdomen soft,NT,ND, BS+ Nervous System:Alert, awake, moving extremities. Extremities: LE edema neg,distal peripheral pulses palpable.  Skin: No rashes,no icterus. MSK: Normal muscle bulk,tone, power  Medications reviewed:  Scheduled Meds:  amitriptyline  25 mg Oral QHS   aspirin  81 mg Oral Daily   cyanocobalamin  1,000 mcg Intramuscular Q Sun   feeding supplement  1 Container Oral TID BM   folic acid  1 mg Oral Daily   heparin  5,000 Units Subcutaneous Q8H   lidocaine  2 patch Transdermal Q24H   mirtazapine  7.5 mg Oral QHS   multivitamin  15 mL Oral Daily   pantoprazole (PROTONIX) IV  40 mg Intravenous Q24H  Continuous Infusions: Unresulted Labs (From admission, onward)     Start     Ordered   08/22/22 1936  Urinalysis, Routine w reflex microscopic  Once,   R        08/22/22 1935          Data Reviewed: I have  personally reviewed following labs and imaging studies CBC: Recent Labs  Lab 08/22/22 1105 08/22/22 1214 08/22/22 2102 08/23/22 0608 08/24/22 1045 08/25/22 0731  WBC 6.8  --  6.5 6.7 6.8 5.2  NEUTROABS 4.0  --  3.3  --  2.2 1.7  HGB 13.1 12.6 11.0* 11.3* 13.7 11.1*  HCT 37.4 37.0 30.9* 31.9* 40.3 32.7*  MCV 94.0  --  93.6 93.3 95.7 96.7  PLT 320  --  PLATELET CLUMPS NOTED ON SMEAR, UNABLE TO ESTIMATE [phone]   Basic Metabolic Panel: Recent Labs  Lab 08/22/22 1105 08/22/22 1214 08/22/22 2102 08/22/22 2154 08/23/22 0608 08/24/22 1045 08/25/22 0731  NA 138   < > 133* 132* 134* 138 137  K 2.7*   < > 3.1* 3.9 3.7 3.2* 3.3*  CL 94*  --  96* 96* 96* 101 104  CO2 25  --  '26 23 27 27 26  '$ GLUCOSE 69*  --  72 70 131* 96 151*  BUN 5*  --  <5* <5* <5* <5* <5*  CREATININE 0.73  --  0.66 0.71 0.72  0.84 0.90  CALCIUM 8.3*  --  7.8* 7.8* 7.8* 8.4* 7.8*  MG 1.6*  --  2.1  --  2.9* 2.0 1.6*  PHOS  --   --  1.6*  --   --   --   --    < > = values in this interval not displayed.   GFR: Estimated Creatinine Clearance: 48.6 mL/min (by C-G formula based on SCr of 0.9 mg/dL). Liver Function Tests: Recent Labs  Lab 08/22/22 1105 08/22/22 2102 08/23/22 0608 08/24/22 1045 08/25/22 0731  AST '17 24 22 '$ 37 41  ALT '8 13 9 14 15  '$ ALKPHOS 61 59 60 73 54  BILITOT 0.7 0.8 0.6 0.5 0.5  PROT 5.3* 4.7* 4.4* 5.3* 4.0*  ALBUMIN 2.5* 1.9* 1.8* 2.1* 1.5*   Recent Labs  Lab 08/22/22 1105  LIPASE <10*   Recent Labs    09/25/21 1034  PROBNP 817*   Recent Labs    08/22/22 2102  HGBA1C 4.3*  Culture/Microbiology    Component Value Date/Time   SDES URINE, CLEAN CATCH 07/25/2022 0330   SPECREQUEST NONE 07/25/2022 0330   CULT  07/25/2022 0330    NO GROWTH Performed at [facility] [address], [place], [place] [address]    REPTSTATUS 07/27/2022 FINAL 07/25/2022 0330  Radiology Studies: CT ABDOMEN PELVIS W CONTRAST  Result Date: 08/24/2022 CLINICAL DATA:  Unintended weight  loss. EXAM: CT ABDOMEN AND PELVIS WITH CONTRAST TECHNIQUE: Multidetector CT imaging of the abdomen and pelvis was performed using the standard protocol following bolus administration of intravenous contrast. RADIATION DOSE REDUCTION: This exam was performed according to the departmental dose-optimization program which includes automated exposure control, adjustment of the mA and/or kV according to patient size and/or use of iterative reconstruction technique. CONTRAST:  160m OMNIPAQUE IOHEXOL 300 MG/ML  SOLN COMPARISON:  07/25/2022 FINDINGS: Lower chest: Dependent atelectasis noted with trace bilateral pleural effusions. Hepatobiliary: Tiny hypodensities scattered in both hepatic lobes are too small to characterize but likely benign. No followup imaging is recommended. Small area of low attenuation in the anterior liver, adjacent to the falciform ligament, is in a characteristic location for focal fatty deposition. There is no evidence for gallstones, gallbladder wall thickening, or pericholecystic fluid. No intrahepatic or extrahepatic biliary dilation. Pancreas: No focal mass lesion. No dilatation of the main duct. No intraparenchymal cyst. No peripancreatic edema. Spleen: No splenomegaly. No focal mass lesion. Adrenals/Urinary Tract: No adrenal nodule or mass. Kidneys unremarkable. No evidence for hydroureter. The urinary bladder appears normal for the degree of distention. Stomach/Bowel: Moderate to large hiatal hernia. Duodenum is normally positioned as is the ligament of Treitz. No small bowel wall thickening. No small bowel dilatation. The terminal ileum is normal. The appendix is not well visualized, but there is no edema or inflammation in the region of the cecum. No gross colonic mass. No colonic wall thickening. Diverticular changes are noted in the left colon without evidence of diverticulitis. Vascular/Lymphatic: There is moderate atherosclerotic calcification of the abdominal aorta without aneurysm.  There is no gastrohepatic or hepatoduodenal ligament lymphadenopathy. No retroperitoneal or mesenteric lymphadenopathy. No pelvic sidewall lymphadenopathy. Reproductive: Hysterectomy.  There is no adnexal mass. Other: No intraperitoneal free fluid. Musculoskeletal: No worrisome lytic or sclerotic osseous abnormality. Stable appearance of compression fractures involving T10-L4 inclusively. IMPRESSION: 1. No acute findings in the abdomen or pelvis. Specifically, no findings to explain the patient's history of weight loss. 2. Moderate to large hiatal hernia. 3. Left colonic diverticulosis without diverticulitis. 4. Stable appearance  of compression fractures involving all levels from T10-L4 inclusively. 5.  Aortic Atherosclerosis (ICD10-I70.0). Electronically Signed   By: Misty Stanley M.D.   On: 08/24/2022 13:26     LOS: 2 days   Antonieta Pert, MD [facility]  08/25/2022, 1:23 PM

## 2022-08-25 NOTE — Telephone Encounter (Signed)
-----  Message from Gatha Mayer, MD sent at 08/24/2022  8:20 AM EST ----- Regarding: cancel 2/1 EGD EGD was performed today - unchanged from 2022 - she has appt 2/1 0815 that needs to be cancelled please

## 2022-08-25 NOTE — Evaluation (Signed)
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   Dx of starvation ketosis, hypokalemia, malnutrition. FTT.  Clinical Impression  Pt admitted with above diagnosis. Mod assist for supine to sit. Pt able to maintain sitting balance at edge of bed. She refused to attempt sit to stand due to indigestion and back pain, she insisted on returning to bed. RN reports pt was able to transfer to bedside commode earlier this morning. Pt very talkative during session and had difficulty maintaining focus on task at hand, appears to be self limiting with mobility.  Pt currently with functional limitations due to the deficits listed below (see PT Problem List). Pt will benefit from skilled PT to increase their independence and safety with mobility to allow discharge to the venue listed below.          Recommendations for follow up therapy are one component of a multi-disciplinary discharge planning process, led by the attending physician.  Recommendations may be updated based on patient status, additional functional criteria and insurance authorization.  Follow Up Recommendations Skilled nursing-short term rehab (<3 hours/day) Can patient physically be transported by private vehicle: No    Assistance Recommended at Discharge Intermittent Supervision/Assistance  Patient can return home with the following  A lot of help with walking and/or transfers;A little  help with bathing/dressing/bathroom;Assistance with cooking/housework;Assist for transportation;Help with stairs or ramp for entrance    Equipment Recommendations None recommended by PT  Recommendations for Other Services       Functional Status Assessment Patient has had a recent decline in their functional status and demonstrates the ability to make significant improvements in function in a reasonable and predictable amount of time.     Precautions / Restrictions Precautions Precautions: Fall Precaution Comments: denies falls in past 6 months  (had one 11 months ago) Restrictions Weight Bearing Restrictions: No      Mobility  Bed Mobility Overal bed mobility: Needs Assistance Bed Mobility: Supine to Sit, Sit to Supine     Supine to sit: Mod assist Sit to supine: Min assist   General bed mobility comments: mod A to raise trunk; min A for BLEs into bed    Transfers                   General transfer comment: pt refused to attempt 2* back pain and indigestion, she returned to supine despite encouragement to attempt to stand    Ambulation/Gait                  Stairs            Wheelchair Mobility    Modified Rankin (Stroke Patients Only)       Balance Overall balance assessment: Needs assistance   Sitting balance-Leahy Scale: Fair                                       Pertinent Vitals/Pain Pain Assessment Pain Assessment: Faces Faces Pain  Scale: Hurts little more Pain Location: back Pain Intervention(s): Limited activity within patient's tolerance, Monitored during session, Premedicated before session    [place] expects to be discharged to:: Private residence Living Arrangements: Spouse/significant other Available Help at Discharge: Family Type of Home: House Home Access: Ramped entrance;Stairs to enter Entrance Stairs-Rails: Right Entrance Stairs-Number of Steps: 7   Home Layout: Able to live  on main level with bedroom/bathroom Home Equipment: Conservation officer, nature (2 wheels);Rollator (4 wheels);Wheelchair - manual Additional Comments: lives with spouse who just came home from SNF, when asked if pt has other family available to help she stated she has a drug addict step daughter. It does not sound like there is consistant assistance available at home.    Prior Function Prior Level of Function : Needs assist             Mobility Comments: walks with rollator, denied falls in past 6 months ADLs Comments: stated she's not been able to transfer into tub recently     Hand Dominance   Dominant Hand: Right    Extremity/Trunk Assessment   Upper Extremity Assessment Upper Extremity Assessment: Generalized weakness    Lower Extremity Assessment Lower Extremity Assessment: Generalized weakness;Difficult to assess due to impaired cognition (difficult to maintain focus to follow commands with muscle testing)    Cervical / Trunk Assessment Cervical / Trunk Assessment: Kyphotic  Communication   Communication: No difficulties  Cognition Arousal/Alertness: Awake/alert Behavior During Therapy: Anxious Overall Cognitive Status: No family/caregiver present to determine baseline cognitive functioning                                 General Comments: able to follow commands, difficult to maintain focus on task at hand, self limiting with mobility, very talkative (about her drug abusing step daughter, spouse just coming home from SNF, having too much clutter at home)        General Comments      Exercises     Assessment/Plan    PT Assessment Patient needs continued PT services  PT Problem List Decreased activity tolerance;Decreased mobility       PT Treatment Interventions Therapeutic activities;Functional mobility training;Gait training;Therapeutic exercise;Balance training;Patient/family education    PT Goals (Current goals can be found in the Care Plan section)   Acute Rehab PT Goals Patient Stated Goal: "to figure out what's wrong with me" PT Goal Formulation: With patient Time For Goal Achievement: 09/08/22 Potential to Achieve Goals: Fair    Frequency Min 2X/week     Co-evaluation               AM-PAC PT "6 Clicks" Mobility  Outcome Measure Help needed turning from your back to your side while in a flat bed without using bedrails?: None Help needed moving from lying on your back to sitting on the side of a flat bed without using bedrails?: A Little Help needed moving to and from a bed to a chair (including a wheelchair)?: A Little Help needed standing up from a chair using your arms (e.g., wheelchair or bedside chair)?: A Lot Help needed to walk in hospital room?: Total Help needed climbing 3-5 steps with a railing? : Total 6 Click Score: 14    End of Session   Activity Tolerance: Other (comment) (pt self limiting) Patient left: in bed;with bed alarm set;with call bell/phone within reach Nurse Communication: Mobility status PT Visit Diagnosis: Difficulty in walking, not elsewhere  classified (R26.2);Pain;Adult, failure to thrive (R62.7)    Time: 1030-1044 PT Time Calculation (min) (ACUTE ONLY): 14 min   Charges:   PT Evaluation $PT Eval Moderate Complexity: 1 Mod          Philomena Doheny PT 08/25/2022  Acute Rehabilitation Services  Office [phone]

## 2022-08-26 DIAGNOSIS — R627 Adult failure to thrive: Secondary | ICD-10-CM | POA: Diagnosis not present

## 2022-08-26 LAB — BASIC METABOLIC PANEL
Anion gap: 6 (ref 5–15)
BUN: <5 mg/dL — ABNORMAL LOW (ref 8–23)
CO2: 26 mmol/L (ref 22–32)
Calcium: 7.8 mg/dL — ABNORMAL LOW (ref 8.9–10.3)
Chloride: 104 mmol/L (ref 98–111)
Creatinine, Ser: 0.92 mg/dL (ref 0.44–1.00)
GFR, Estimated: >60 mL/min (ref >60)
Glucose, Bld: 93 mg/dL (ref 70–99)
Potassium: 3.8 mmol/L (ref 3.5–5.1)
Sodium: 136 mmol/L (ref 135–145)

## 2022-08-26 LAB — GLUCOSE, CAPILLARY
Glucose-Capillary: 103 mg/dL — ABNORMAL HIGH (ref 70–99)
Glucose-Capillary: 105 mg/dL — ABNORMAL HIGH (ref 70–99)
Glucose-Capillary: 135 mg/dL — ABNORMAL HIGH (ref 70–99)

## 2022-08-26 LAB — CBC
HCT: 30.9 % — ABNORMAL LOW (ref 36.0–46.0)
Hemoglobin: 10.9 g/dL — ABNORMAL LOW (ref 12.0–15.0)
MCH: 33.5 pg (ref 26.0–34.0)
MCHC: 35.3 g/dL (ref 30.0–36.0)
MCV: 95.1 fL (ref 80.0–100.0)
Platelets: 270 10*3/uL (ref 150–400)
RBC: 3.25 MIL/uL — ABNORMAL LOW (ref 3.87–5.11)
RDW: 17.6 % — ABNORMAL HIGH (ref 11.5–15.5)
WBC: 6 10*3/uL (ref 4.0–10.5)
nRBC: 0 % (ref 0.0–0.2)

## 2022-08-26 MED ORDER — BISMUTH SUBSALICYLATE 262 MG/15ML PO SUSP
30.0000 mL | ORAL | Status: DC | PRN
  Administered 2022-08-26 – 2022-09-01 (×10): 30 mL via ORAL
  Filled 2022-08-26 (×2): qty 236

## 2022-08-26 NOTE — Consult Note (Signed)
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 data recorded Duration of Psychotic Symptoms:No data recorded Hallucinations:Hallucinations: None  Ideas of Reference:None  Suicidal Thoughts:Suicidal Thoughts: No  Homicidal Thoughts:Homicidal Thoughts: No   Sensorium  Memory:Remote Good; Recent Good; Immediate Fair  Judgment:Good  Insight:Good   Executive Functions  Concentration:Good  Attention Span:Good  [place] of Knowledge:Good  Language:Good   Psychomotor Activity  Psychomotor Activity:Psychomotor Activity: Normal   Assets  Assets:Communication Skills; Leisure Time; Resilience; Social Support; Housing; Financial  Resources/Insurance   Sleep  Sleep:Sleep: Poor   Physical Exam: Physical Exam Vitals and nursing note reviewed.  Constitutional:      Appearance: Normal appearance. She is normal weight.  HENT:     Head: Normocephalic.  Skin:    General: Skin is warm  and dry.  Neurological:     General: No focal deficit present.     Mental Status: She is alert and oriented to person, place, and time. Mental status is at baseline.  Psychiatric:        Attention and Perception: Attention and perception normal.        Mood and Affect: Mood and affect normal.        Speech: Speech normal.        Behavior: Behavior normal. Behavior is cooperative.        Thought Content: Thought content normal.        Cognition and Memory: Cognition and memory normal.        Judgment: Judgment normal.    Review of Systems  Psychiatric/Behavioral: Negative.    All other systems reviewed and are negative.  Blood pressure 118/66, pulse (!) 105, temperature 98.4 F (36.9 C), resp. rate 20, height '5\' 5"'$  (1.651 m), weight 65 kg, SpO2 92 %. Body mass index is 23.85 kg/m.  Treatment Plan Summary: Daily contact with patient to assess and evaluate symptoms and progress in treatment, Medication management, and Plan    Previously ordered urine drug screen as an add on, however does not appear to be available. -Continue to increase and titrate mirtazapine to 15 mg p.o. nightly.  No side effects or adverse reactions, started on January 21.  Through shared decision making this appears to be most beneficial medication for patient and symptom management. -Will suggest COWS protocol/clonidine detox.  Patient currently not presenting with any withdrawal symptoms however endorses being on cough medication for quite some time and it is something we need to consider going forward. -Consider low-dose trazodone or hydroxyzine for insomnia.  Currently prescribed Ambien for insomnia, however advised against this during hospitalization  stay. -Consider initiation of delirium precautions due to age and multiple risk factors. -Continue current recommendations from nutrition/dietary and encourage food choices at each meal.  Psychiatry consult service will sign off at this time.  There are no immediate or foreseeable barriers to discharge. Disposition: No evidence of imminent risk to self or others at present.   Patient does not meet criteria for psychiatric inpatient admission. Supportive therapy provided about ongoing stressors. Discussed crisis plan, support from social network, calling 911, coming to the Emergency Department, and calling Suicide Hotline.  Suella Broad, FNP 08/26/2022 11:39 AM

## 2022-08-26 NOTE — Progress Notes (Signed)
Chaplain introduced spiritual care services to [place] who requested prayer.  Chaplain and Donna Bailey prayed together.  She is anxious that she doesn't have a clear diagnosis, but she feels clear that if it is "her time" that she will go willingly.  Her faith is very important to her.  Chaplain provided reflective listening and will follow up more tomorrow.  [address] [place] Pager, [phone]

## 2022-08-26 NOTE — TOC Progression Note (Signed)
Transition of Care [facility]) - Progression Note    Patient Details  Name: Donna Bailey MRN: [id number] Date of Birth: 1945-11-04  Transition of Care [facility]) CM/SW [place], LCSW Phone Number: 08/26/2022, 11:48 AM  Clinical Narrative:    Met with pt in room to review bed offers for SNF. [facility] was unable to extend bed offer for pt. Reviewed other bed offers and shared Medicare star rating for each facility. Pt apprehensive and unsure if she is wanting to go to SNF or "go home and die."  Pt had many questions and concerns regarding eating and feeding tubes, hospice/palliative care, and quality of life. CSW provided listening ear and discussed consulting palliative care to have these discussions. Pt agreeable to having PC consulted. Pt also requested that diet be changed to "regular" as she has not been able to eat very much of the puree form. MD notified.   Expected Discharge Plan: [place] Barriers to Discharge: Continued Medical Work up  Expected Discharge Plan and Services In-house Referral: Clinical Social Work Discharge Planning Services: CM Consult Post Acute Care Choice: [place] Living arrangements for the past 2 months: Single Family Home                 DME Arranged: N/A DME Agency: NA                   Social Determinants of Health (SDOH) Interventions SDOH Screenings   Depression (PHQ2-9): Low Risk  (03/15/2020)  Tobacco Use: Low Risk  (08/24/2022)    Readmission Risk Interventions    11/27/2021   11:39 AM  Readmission Risk Prevention Plan  Transportation Screening Complete  PCP or Specialist Appt within 5-7 Days Complete  Home Care Screening Complete  Medication Review (RN CM) Complete

## 2022-08-26 NOTE — Plan of Care (Signed)

## 2022-08-26 NOTE — Progress Notes (Signed)
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 In ED:was found to be hypoglycemic as well as hypokalemia, hypomagnesemia  w/ acidosis with concerns for starvation ketosis with an elevated beta hydroxybutyrate.  Patient was admitted for further management of electrolyte imbalance starvation ketosis and failure to thrive S/p intravenous volume resuscitation and electrolyte replacement, GI has been consulted> underwent EGD> and CT abdomen pelvis without new abnormality and no signs of GI problem to explain-DVT and GI signed off. Due to concern for psychiatric cause of patient's aversion to food psychiatry has been consulted   Subjective: Seen and examined this morning.  Resting comfortably  Reports he feels having nausea or diarrhea but has not had any Reports She tried 5 different foods 1 bite each- did not like the visual appeal and all food tasted the same   Assessment and Plan: Principal Problem:   Failure to thrive in adult Active Problems:   Ketosis ([place])   Protein-calorie malnutrition, severe ([place])   Hypokalemia due to inadequate potassium intake   Hypomagnesemia   Coronary artery disease involving native coronary artery of native heart without angina pectoris   Chronic diastolic CHF (congestive heart failure) (HCC)   Normocytic anemia   Dysphagia   Generalized weakness   Loss of weight   Early satiety   Fundic gland polyps of stomach, benign   Adult failure  to thrive Starvation ketoacidosis Inadequate oral intake Aversion to food: Patient underwent extensive workup with GI evaluation, EGD that is unchanged from EGD from 2022, CT of the abdomen and pelvis with IV and oral contrast unrevealing for any cause of patient's presentation.  Wondering if psychiatric issues is contributing to her symptoms- psychiatry has been consulted and following.  Food does not taste the same and has aversion it seems.  She would like to know more about feeding tube nursing to print out feeding tube information  Wants to avoid any medication that has corn derivative in it believing that it may have aflatoxin in it.  Continue PPI, Lipidem patch,Pepto-Bismol. Continue Elavil boost supplement multivitamin Remeron bedtime.  Dietitian following closely encourage oral intake.  Of note her weight is not decreasing and has been up since admission.  Hypokalemia Hypomagnesemia: 2/2 poor po intake.  Resolved and remained stable Recent Labs  Lab 08/22/22 1105 08/22/22 1214 08/22/22 2102 08/22/22 2154 08/23/22 0608 08/24/22 1045 08/25/22 0731 08/26/22 0610  K 2.7*   < > 3.1* 3.9 3.7 3.2* 3.3* 3.8  CALCIUM 8.3*  --  7.8* 7.8* 7.8* 8.4* 7.8* 7.8*  MG 1.6*  --  2.1  --  2.9* 2.0 1.6*  --   PHOS  --   --  1.6*  --   --   --   --   --    < > = values in this interval not displayed.     CAD: No chest pain continue aspirin 81 Chronic diastolic CHF: Currently euvolemic.  BNP elevated in 800s> chest x-ray after 1/22 and clear. Monitor fluid status/daily weight. Net IO Since Admission: 2,630.44 mL [08/26/22  1136]  Filed Weights   08/22/22 1829 08/25/22 0500 08/26/22 0500  Weight: 59 kg 64.4 kg 65 kg    Normocytic anemia: Iron B12 ferritin elevated.Hemoglobin stable Recent Labs  Lab 08/22/22 2102 08/23/22 0608 08/24/22 1045 08/25/22 0731 08/26/22 0610  HGB 11.0* 11.3* 13.7 11.1* 10.9*  HCT 30.9* 31.9* 40.3 32.7* 30.9*    Inadequate oral intake:Dietitian input appreciated  no malnutrition currently but at risk. Her weight overall is stable/up Nutrition Problem: Inadequate oral intake Etiology: nausea, vomiting Signs/Symptoms: per patient/family report Interventions: Ensure Enlive (each supplement provides 350kcal and 20 grams of protein), MVI  DVT prophylaxis: heparin injection 5,000 Units Start: 08/22/22 2200 Code Status:   Code Status: DNR Family Communication: plan of care discussed with patient at bedside. Patient status is: Inpatient because of ongoing evaluation for FTT Level of care: Med-Surg   Dispo: The patient is from: home            Anticipated disposition: needs SNF  Objective: Vitals last 24 hrs: Vitals:   08/25/22 0500 08/25/22 2250 08/26/22 0500 08/26/22 0520  BP:  130/68  118/66  Pulse:  (!) 103  (!) 105  Resp:  19  20  Temp:  98.4 F (36.9 C)  98.4 F (36.9 C)  TempSrc:  Oral    SpO2:  96%  92%  Weight: 64.4 kg  65 kg   Height:       Weight change: 0.6 kg  Physical Examination: General exam: AAox3, weak,older appearing HEENT:Oral mucosa moist, Ear/Nose WNL grossly, dentition normal. Respiratory system: bilaterally clear BS, no use of accessory muscle Cardiovascular system: S1 & S2 +, regular rate. Gastrointestinal system: Abdomen soft, NT,ND,BS+ Nervous System:Alert, awake, moving extremities and grossly nonfocal Extremities: LE ankle edema neg, lower extremities warm Skin: No rashes,no icterus. MSK: Normal muscle bulk,tone, power   Medications reviewed:  Scheduled Meds:  amitriptyline  25 mg Oral QHS   aspirin  81 mg Oral Daily   cyanocobalamin  1,000 mcg Intramuscular Q Sun   feeding supplement  1 Container Oral TID BM   folic acid  1 mg Oral Daily   heparin  5,000 Units Subcutaneous Q8H   lidocaine  2 patch Transdermal Q24H   mirtazapine  7.5 mg Oral QHS   multivitamin  15 mL Oral Daily   pantoprazole  40 mg Oral Daily  Continuous Infusions: Unresulted Labs (From admission, onward)     Start     Ordered    08/25/22 1404  Rapid urine drug screen (hospital performed)  Add-on,   AD        08/25/22 1403   08/22/22 1936  Urinalysis, Routine w reflex microscopic  Once,   R        08/22/22 1935          Data Reviewed: I have personally reviewed following labs and imaging studies CBC: Recent Labs  Lab 08/22/22 1105 08/22/22 1214 08/22/22 2102 08/23/22 0608 08/24/22 1045 08/25/22 0731 08/26/22 0610  WBC 6.8  --  6.5 6.7 6.8 5.2 6.0  NEUTROABS 4.0  --  3.3  --  2.2 1.7  --   HGB 13.1   < > 11.0* 11.3* 13.7 11.1* 10.9*  HCT 37.4   < > 30.9* 31.9* 40.3 32.7* 30.9*  MCV 94.0  --  93.6 93.3 95.7 96.7 95.1  PLT 320  --  PLATELET CLUMPS NOTED ON SMEAR, UNABLE TO ESTIMATE 273 339 260 270   < > = values in this interval not displayed.  Basic Metabolic Panel: Recent Labs  Lab 08/22/22 1105 08/22/22 1214 08/22/22 2102 08/22/22 2154 08/23/22 0608 08/24/22 1045 08/25/22 0731 08/26/22 0610  NA 138   < > 133* 132* 134* 138 137 136  K 2.7*   < > 3.1* 3.9 3.7 3.2* 3.3* 3.8  CL 94*  --  96* 96* 96* 101 104 104  CO2 25  --  '26 23 27 27 26 26  '$ GLUCOSE 69*  --  72 70 131* 96 151* 93  BUN 5*  --  <5* <5* <5* <5* <5* <5*  CREATININE 0.73  --  0.66 0.71 0.72 0.84 0.90 0.92  CALCIUM 8.3*  --  7.8* 7.8* 7.8* 8.4* 7.8* 7.8*  MG 1.6*  --  2.1  --  2.9* 2.0 1.6*  --   PHOS  --   --  1.6*  --   --   --   --   --    < > = values in this interval not displayed.    GFR: Estimated Creatinine Clearance: 47.5 mL/min (by C-G formula based on SCr of 0.92 mg/dL). Liver Function Tests: Recent Labs  Lab 08/22/22 1105 08/22/22 2102 08/23/22 0608 08/24/22 1045 08/25/22 0731  AST '17 24 22 '$ 37 41  ALT '8 13 9 14 15  '$ ALKPHOS 61 59 60 73 54  BILITOT 0.7 0.8 0.6 0.5 0.5  PROT 5.3* 4.7* 4.4* 5.3* 4.0*  ALBUMIN 2.5* 1.9* 1.8* 2.1* 1.5*    Recent Labs  Lab 08/22/22 1105  LIPASE <10*    Recent Labs    09/25/21 1034  PROBNP 817*    No results for input(s): "HGBA1C" in the last 72  hours. Culture/Microbiology    Component Value Date/Time   SDES URINE, CLEAN CATCH 07/25/2022 0330   SPECREQUEST NONE 07/25/2022 0330   CULT  07/25/2022 0330    NO GROWTH Performed at [facility] [address] [place], [place] [address]    REPTSTATUS 07/27/2022 FINAL 07/25/2022 0330  Radiology Studies: [facility] Chest Port 1 View  Result Date: 08/25/2022 CLINICAL DATA:  Congestive heart failure, failure to thrive. EXAM: PORTABLE CHEST 1 VIEW COMPARISON:  May 23, 2021. FINDINGS: The heart size and mediastinal contours are within normal limits. Both lungs are clear. The visualized skeletal structures are unremarkable. IMPRESSION: No active disease. Electronically Signed   By: Marijo Conception M.D.   On: 08/25/2022 13:52   CT ABDOMEN PELVIS W CONTRAST  Result Date: 08/24/2022 CLINICAL DATA:  Unintended weight loss. EXAM: CT ABDOMEN AND PELVIS WITH CONTRAST TECHNIQUE: Multidetector CT imaging of the abdomen and pelvis was performed using the standard protocol following bolus administration of intravenous contrast. RADIATION DOSE REDUCTION: This exam was performed according to the departmental dose-optimization program which includes automated exposure control, adjustment of the mA and/or kV according to patient size and/or use of iterative reconstruction technique. CONTRAST:  161m OMNIPAQUE IOHEXOL 300 MG/ML  SOLN COMPARISON:  07/25/2022 FINDINGS: Lower chest: Dependent atelectasis noted with trace bilateral pleural effusions. Hepatobiliary: Tiny hypodensities scattered in both hepatic lobes are too small to characterize but likely benign. No followup imaging is recommended. Small area of low attenuation in the anterior liver, adjacent to the falciform ligament, is in a characteristic location for focal fatty deposition. There is no evidence for gallstones, gallbladder wall thickening, or pericholecystic fluid. No intrahepatic or extrahepatic biliary dilation. Pancreas: No focal mass lesion. No  dilatation of the main duct. No intraparenchymal cyst. No peripancreatic edema. Spleen: No splenomegaly. No focal mass  lesion. Adrenals/Urinary Tract: No adrenal nodule or mass. Kidneys unremarkable. No evidence for hydroureter. The urinary bladder appears normal for the degree of distention. Stomach/Bowel: Moderate to large hiatal hernia. Duodenum is normally positioned as is the ligament of Treitz. No small bowel wall thickening. No small bowel dilatation. The terminal ileum is normal. The appendix is not well visualized, but there is no edema or inflammation in the region of the cecum. No gross colonic mass. No colonic wall thickening. Diverticular changes are noted in the left colon without evidence of diverticulitis. Vascular/Lymphatic: There is moderate atherosclerotic calcification of the abdominal aorta without aneurysm. There is no gastrohepatic or hepatoduodenal ligament lymphadenopathy. No retroperitoneal or mesenteric lymphadenopathy. No pelvic sidewall lymphadenopathy. Reproductive: Hysterectomy.  There is no adnexal mass. Other: No intraperitoneal free fluid. Musculoskeletal: No worrisome lytic or sclerotic osseous abnormality. Stable appearance of compression fractures involving T10-L4 inclusively. IMPRESSION: 1. No acute findings in the abdomen or pelvis. Specifically, no findings to explain the patient's history of weight loss. 2. Moderate to large hiatal hernia. 3. Left colonic diverticulosis without diverticulitis. 4. Stable appearance of compression fractures involving all levels from T10-L4 inclusively. 5.  Aortic Atherosclerosis (ICD10-I70.0). Electronically Signed   By: Misty Stanley M.D.   On: 08/24/2022 13:26     LOS: 3 days   Antonieta Pert, MD [facility]  08/26/2022, 11:36 AM

## 2022-08-27 DIAGNOSIS — R627 Adult failure to thrive: Secondary | ICD-10-CM | POA: Diagnosis not present

## 2022-08-27 LAB — BASIC METABOLIC PANEL
Anion gap: 7 (ref 5–15)
BUN: <5 mg/dL — ABNORMAL LOW (ref 8–23)
CO2: 24 mmol/L (ref 22–32)
Calcium: 7.8 mg/dL — ABNORMAL LOW (ref 8.9–10.3)
Chloride: 106 mmol/L (ref 98–111)
Creatinine, Ser: 1.05 mg/dL — ABNORMAL HIGH (ref 0.44–1.00)
GFR, Estimated: 55 mL/min — ABNORMAL LOW (ref >60)
Glucose, Bld: 82 mg/dL (ref 70–99)
Potassium: 3.4 mmol/L — ABNORMAL LOW (ref 3.5–5.1)
Sodium: 137 mmol/L (ref 135–145)

## 2022-08-27 LAB — CBC
HCT: 32 % — ABNORMAL LOW (ref 36.0–46.0)
Hemoglobin: 11.1 g/dL — ABNORMAL LOW (ref 12.0–15.0)
MCH: 33.4 pg (ref 26.0–34.0)
MCHC: 34.7 g/dL (ref 30.0–36.0)
MCV: 96.4 fL (ref 80.0–100.0)
Platelets: 255 10*3/uL (ref 150–400)
RBC: 3.32 MIL/uL — ABNORMAL LOW (ref 3.87–5.11)
RDW: 17.4 % — ABNORMAL HIGH (ref 11.5–15.5)
WBC: 5.7 10*3/uL (ref 4.0–10.5)
nRBC: 0 % (ref 0.0–0.2)

## 2022-08-27 MED ORDER — GUAIFENESIN-CODEINE 100-10 MG/5ML PO SOLN
5.0000 mL | Freq: Two times a day (BID) | ORAL | Status: DC | PRN
  Administered 2022-08-27 – 2022-08-29 (×3): 5 mL via ORAL
  Filled 2022-08-27 (×3): qty 5

## 2022-08-27 MED ORDER — POTASSIUM CHLORIDE CRYS ER 20 MEQ PO TBCR
40.0000 meq | EXTENDED_RELEASE_TABLET | Freq: Once | ORAL | Status: AC
  Administered 2022-08-27: 40 meq via ORAL
  Filled 2022-08-27: qty 2

## 2022-08-27 NOTE — Progress Notes (Signed)
PROGRESS NOTE Donna Bailey  UDJ:[id number] DOB: Apr 17, 1946 DOA: 08/22/2022 PCP: [name], MD   Brief Narrative/Hospital Course: [phone] W/ short segment Barrett's esophagus (seen on EGD 01/2021),  pancreatic atrophy (seen on CTA 06/2021), CAD S/ STEMI with LAD PCI 2/77/4128 with cath complicated by radial artery dissection and hematoma, ischemic cardiomyopathy with systolic and diastolic heart failure(Echo 07/2021 EF 60-65% with G1DD, recovered EF from 40% in the past) presented with complaints of continued nausea vomiting and inability to tolerate oral intake. In ED:was found to be hypoglycemic as well as hypokalemia, hypomagnesemia  w/ acidosis with concerns for starvation ketosis with an elevated beta hydroxybutyrate.  Patient was admitted for further management of electrolyte imbalance starvation ketosis and failure to thrive S/p intravenous volume resuscitation and electrolyte replacement, GI has been consulted> underwent EGD> and CT abdomen pelvis without new abnormality and no signs of GI problem to explain-DVT and GI signed off. Due to concern for psychiatric cause of patient's aversion to food psychiatry has been consulted.  Concern for psych cause, possible PTSD surrounding prior MI that occurred when she tried to eat and fixation with death.  Subjective: Seen and examined this morning.  She is calm pleasant no sweating nausea vomiting no tremors no tachycardia no evidence of withdrawal.  Asking for cough medication.   Reports she woke up at 3 AM with indigestion. Tried taking few bites this morning but unable to continue  Assessment and Plan: Principal Problem:   Failure to thrive in adult Active Problems:   Ketosis (HCC)   Protein-calorie malnutrition, severe (HCC)   Hypokalemia due to inadequate potassium intake   Hypomagnesemia   Coronary artery disease involving native coronary artery of native heart without angina pectoris   Chronic diastolic CHF (congestive heart  failure) (HCC)   Normocytic anemia   Dysphagia   Generalized weakness   Loss of weight   Early satiety   Fundic gland polyps of stomach, benign   Adult failure to thrive Starvation ketoacidosis Inadequate oral intake Aversion to food: Patient underwent extensive workup with GI evaluation, EGD that is unchanged from EGD from 2022, CT of the abdomen and pelvis with IV and oral contrast unrevealing for any cause of patient's presentation.  Wondering if psychiatric issues is contributing to her symptoms- psychiatry has been consulted> no immediate need for inpatient psych, psych has signed off advised to increase and titrate Remeron nightly, sleeping aid, currently no signs of withdrawal, has been on Medication chronically requesting to resume.  Patient requested palliative care consult-awaiting input.  Dietitian to follow  consider calorie count and if unable to meet her goals wondering if she qualifies for PEG tube feeding if aligns with her goals of care.  Continue PPI, Pepto-Bismol, Lidoderm, nutritional supplement and augment diet.  Encourage oral intake.  Hypokalemia Hypomagnesemia: 2/2 poor po intake.  Replete potassium this morning Recent Labs  Lab 08/22/22 1105 08/22/22 1214 08/22/22 2102 08/22/22 2154 08/23/22 0608 08/24/22 1045 08/25/22 0731 08/26/22 0610 08/27/22 0610  K 2.7*   < > 3.1*   < > 3.7 3.2* 3.3* 3.8 3.4*  CALCIUM 8.3*  --  7.8*   < > 7.8* 8.4* 7.8* 7.8* 7.8*  MG 1.6*  --  2.1  --  2.9* 2.0 1.6*  --   --   PHOS  --   --  1.6*  --   --   --   --   --   --    < > = values in this interval  not displayed.    CAD: No chest pain continue aspirin 81 Chronic diastolic CHF:.BNP elevated in 800s> chest x-ray 1/22 clear, and remains euvolemic.Monitor fluid status/daily weight. Net IO Since Admission: 2,630.44 mL [08/27/22 1056]  Filed Weights   08/25/22 0500 08/26/22 0500 08/27/22 0500  Weight: 64.4 kg 65 kg 62.6 kg    Normocytic anemia: Iron B12 ferritin  elevated.Hemoglobin stable Recent Labs  Lab 08/23/22 0608 08/24/22 1045 08/25/22 0731 08/26/22 0610 08/27/22 0610  HGB 11.3* 13.7 11.1* 10.9* 11.1*  HCT 31.9* 40.3 32.7* 30.9* 32.0*    Inadequate oral intake:Dietitian input appreciated no malnutrition currently but at risk. Her weight overall is stable/up Nutrition Problem: Inadequate oral intake Etiology: nausea, vomiting Signs/Symptoms: per patient/family report Interventions: Ensure Enlive (each supplement provides 350kcal and 20 grams of protein), MVI  DVT prophylaxis: heparin injection 5,000 Units Start: 08/22/22 2200 Code Status:   Code Status: DNR Family Communication: plan of care discussed with patient at bedside. Patient status is: Inpatient because of ongoing evaluation for FTT Level of care: Med-Surg   Dispo: The patient is from: home            Anticipated disposition: needs SNF  Objective: Vitals last 24 hrs: Vitals:   08/26/22 1425 08/27/22 0056 08/27/22 0500 08/27/22 0601  BP: (!) 104/52 (!) 120/52  128/63  Pulse: (!) 102 (!) 101  (!) 101  Resp: '19 18  18  '$ Temp: 97.8 F (36.6 C) (!) 97.4 F (36.3 C)  98.2 F (36.8 C)  TempSrc: Oral Oral    SpO2: 95% 96%  93%  Weight:   62.6 kg   Height:       Weight change: -2.4 kg  Physical Examination: General exam: alert awake,calm older than stated age HEENT:Oral mucosa moist, Ear/Nose WNL grossly Respiratory system: Bilaterally clear BS, no use of accessory muscle Cardiovascular system: S1 & S2 +, No JVD. Gastrointestinal system: Abdomen soft,NT,ND, BS+ Nervous System: Alert, awake, moving extremities, she follows commands. Extremities: LE edema neg,distal peripheral pulses palpable.  Skin: No rashes,no icterus. MSK: Normal muscle bulk,tone, power   Medications reviewed:  Scheduled Meds:  amitriptyline  25 mg Oral QHS   aspirin  81 mg Oral Daily   cyanocobalamin  1,000 mcg Intramuscular Q Sun   feeding supplement  1 Container Oral TID BM   folic acid   1 mg Oral Daily   heparin  5,000 Units Subcutaneous Q8H   lidocaine  2 patch Transdermal Q24H   mirtazapine  7.5 mg Oral QHS   multivitamin  15 mL Oral Daily   pantoprazole  40 mg Oral Daily  Continuous Infusions: Unresulted Labs (From admission, onward)     Start     Ordered   08/25/22 1404  Rapid urine drug screen (hospital performed)  Add-on,   AD        08/25/22 1403   08/22/22 1936  Urinalysis, Routine w reflex microscopic  Once,   R        08/22/22 1935          Data Reviewed: I have personally reviewed following labs and imaging studies CBC: Recent Labs  Lab 08/22/22 1105 08/22/22 1214 08/22/22 2102 08/23/22 0608 08/24/22 1045 08/25/22 0731 08/26/22 0610 08/27/22 0610  WBC 6.8  --  6.5 6.7 6.8 5.2 6.0 5.7  NEUTROABS 4.0  --  3.3  --  2.2 1.7  --   --   HGB 13.1   < > 11.0* 11.3* 13.7 11.1* 10.9* 11.1*  HCT 37.4   < >  30.9* 31.9* 40.3 32.7* 30.9* 32.0*  MCV 94.0  --  93.6 93.3 95.7 96.7 95.1 96.4  PLT 320  --  PLATELET CLUMPS NOTED ON SMEAR, UNABLE TO ESTIMATE 273 339 260 270 255   < > = values in this interval not displayed.   Basic Metabolic Panel: Recent Labs  Lab 08/22/22 1105 08/22/22 1214 08/22/22 2102 08/22/22 2154 08/23/22 0608 08/24/22 1045 08/25/22 0731 08/26/22 0610 08/27/22 0610  NA 138   < > 133*   < > 134* 138 137 136 137  K 2.7*   < > 3.1*   < > 3.7 3.2* 3.3* 3.8 3.4*  CL 94*  --  96*   < > 96* 101 104 104 106  CO2 25  --  26   < > '27 27 26 26 24  '$ GLUCOSE 69*  --  72   < > 131* 96 151* 93 82  BUN 5*  --  <5*   < > <5* <5* <5* <5* <5*  CREATININE 0.73  --  0.66   < > 0.72 0.84 0.90 0.92 1.05*  CALCIUM 8.3*  --  7.8*   < > 7.8* 8.4* 7.8* 7.8* 7.8*  MG 1.6*  --  2.1  --  2.9* 2.0 1.6*  --   --   PHOS  --   --  1.6*  --   --   --   --   --   --    < > = values in this interval not displayed.   GFR: Estimated Creatinine Clearance: 41.7 mL/min (A) (by C-G formula based on SCr of 1.05 mg/dL (H)). Liver Function Tests: Recent Labs  Lab  08/22/22 1105 08/22/22 2102 08/23/22 0608 08/24/22 1045 08/25/22 0731  AST '17 24 22 '$ 37 41  ALT '8 13 9 14 15  '$ ALKPHOS 61 59 60 73 54  BILITOT 0.7 0.8 0.6 0.5 0.5  PROT 5.3* 4.7* 4.4* 5.3* 4.0*  ALBUMIN 2.5* 1.9* 1.8* 2.1* 1.5*   Recent Labs  Lab 08/22/22 1105  LIPASE <10*   Recent Labs    09/25/21 1034  PROBNP 817*   No results for input(s): "HGBA1C" in the last 72 hours. Culture/Microbiology    Component Value Date/Time   SDES URINE, CLEAN CATCH 07/25/2022 0330   SPECREQUEST NONE 07/25/2022 0330   CULT  07/25/2022 0330    NO GROWTH Performed at [facility] [address], [place], [place] [address]    REPTSTATUS 07/27/2022 FINAL 07/25/2022 0330  Radiology Studies: [facility] Chest Port 1 View  Result Date: 08/25/2022 CLINICAL DATA:  Congestive heart failure, failure to thrive. EXAM: PORTABLE CHEST 1 VIEW COMPARISON:  May 23, 2021. FINDINGS: The heart size and mediastinal contours are within normal limits. Both lungs are clear. The visualized skeletal structures are unremarkable. IMPRESSION: No active disease. Electronically Signed   By: Marijo Conception M.D.   On: 08/25/2022 13:52     LOS: 4 days   Antonieta Pert, MD [facility]  08/27/2022, 10:56 AM

## 2022-08-27 NOTE — Progress Notes (Signed)
Nutrition Brief Note  Received secure message from RN stating MD wants calorie count started. RD has placed order for 48 hour Calorie count.  Per chart review, pt has not been eating much, bites then cannot complete meal. On 1/22, documented meal completion between 10-20%.  Palliative care consulted for [place].  Will have results of day 1 on 1/25.  Labs and medications reviewed.   If other nutrition issues arise, please consult RD.   Clayton Bibles, MS, RD, LDN Inpatient Clinical Dietitian Contact information available via Amion

## 2022-08-27 NOTE — Progress Notes (Signed)
Occupational Therapy Treatment Patient Details Name: Donna Bailey MRN: [id number] DOB: Aug 26, 1945 Today's Date: 08/27/2022   History of present illness Patient is a 77 year old female who presented on 1/19 with decreased oral intake, nausea and vomiting. Patient was admitted with hypoglycemia, hypokalemia, and hypomagnesia and concerns for starvation ketosis.patient underwent EGD on 1/21. PMH:CAD, chronic diastolic CHF, thoracic compression fx T11-L4 07/2022, peripheral neuropathy, MI.   OT comments  Patient was able to engage in few steps around bed with HHA x1 with patient declining to use RW. Patient continues to have poor activity tolerance impacting session. Patient's discharge plan remains appropriate at this time. OT will continue to follow acutely.     Recommendations for follow up therapy are one component of a multi-disciplinary discharge planning process, led by the attending physician.  Recommendations may be updated based on patient status, additional functional criteria and insurance authorization.    Follow Up Recommendations  Skilled nursing-short term rehab (<3 hours/day)     Assistance Recommended at Discharge Frequent or constant Supervision/Assistance  Patient can return home with the following  A lot of help with bathing/dressing/bathroom;Assistance with cooking/housework;Direct supervision/assist for medications management;Assist for transportation;Help with stairs or ramp for entrance;Direct supervision/assist for financial management;A lot of help with walking and/or transfers   Equipment Recommendations  None recommended by OT    Recommendations for Other Services      Precautions / Restrictions Precautions Precautions: Fall Restrictions Weight Bearing Restrictions: No       Mobility Bed Mobility Overal bed mobility: Needs Assistance Bed Mobility: Supine to Sit, Sit to Supine     Supine to sit: Min guard Sit to supine: Min assist   General bed  mobility comments: min A for BLEs into bed       Balance Overall balance assessment: Needs assistance   Sitting balance-Leahy Scale: Fair     Standing balance support: Bilateral upper extremity supported Standing balance-Leahy Scale: Poor           ADL either performed or assessed with clinical judgement   ADL Overall ADL's : Needs assistance/impaired               Lower Body Bathing Details (indicate cue type and reason): patient declined to bath with therapist reporting that she had not been washed up in 2 days and that she needed someone to wash her. patietn was educated on importance of engagement in these task as she did them at home. patient declined. nurse made aware that of patients reports. nurse reported that she personally assisted patient previous day. patient's desire to have wash up after lunch were convayed to nurse. nurse verbalized understanding.                       General ADL Comments: patient was able to stand from EOB with HHA and min guard to walk around bed to opposite side. patient then reported that she was too tired to engage in any more tasks. patient returned to supine with min guard with increased time.      Cognition Arousal/Alertness: Awake/alert Behavior During Therapy: Anxious Overall Cognitive Status: No family/caregiver present to determine baseline cognitive functioning           General Comments: able to follow commands, difficult to maintain focus on task at hand, very talkative                   Pertinent Vitals/ Pain  Pain Assessment Pain Assessment: 0-10 Faces Pain Scale: Hurts even more Pain Location: back with movement Pain Descriptors / Indicators: Constant Pain Intervention(s): Limited activity within patient's tolerance, Premedicated before session, Repositioned   Frequency  Min 2X/week        Progress Toward Goals  OT Goals(current goals can now be found in the care plan section)   Progress towards OT goals: Progressing toward goals     Plan Discharge plan remains appropriate       AM-PAC OT "6 Clicks" Daily Activity     Outcome Measure   Help from another person eating meals?: A Little Help from another person taking care of personal grooming?: A Little Help from another person toileting, which includes using toliet, bedpan, or urinal?: A Lot Help from another person bathing (including washing, rinsing, drying)?: A Lot Help from another person to put on and taking off regular upper body clothing?: A Little Help from another person to put on and taking off regular lower body clothing?: A Lot 6 Click Score: 15    End of Session Equipment Utilized During Treatment: Gait belt  OT Visit Diagnosis: Unsteadiness on feet (R26.81);Other abnormalities of gait and mobility (R26.89);Muscle weakness (generalized) (M62.81)   Activity Tolerance Patient limited by pain   Patient Left in bed;with call bell/phone within reach;with bed alarm set   Nurse Communication Other (comment) (patients request for bath this afternoon)        Time: 8889-1694 OT Time Calculation (min): 21 min  Charges: OT General Charges $OT Visit: 1 Visit OT Treatments $Self Care/Home Management : 8-22 mins  Rennie Plowman, MS Acute Rehabilitation Department Office# [phone]  Willa Rough 08/27/2022, 12:20 PM

## 2022-08-28 DIAGNOSIS — Z515 Encounter for palliative care: Secondary | ICD-10-CM | POA: Diagnosis not present

## 2022-08-28 DIAGNOSIS — Z7189 Other specified counseling: Secondary | ICD-10-CM | POA: Diagnosis not present

## 2022-08-28 DIAGNOSIS — R627 Adult failure to thrive: Secondary | ICD-10-CM | POA: Diagnosis not present

## 2022-08-28 NOTE — Progress Notes (Signed)
Chaplain met with Donna Bailey to provide follow up support.  Her faith is very important to her and she requested a prayer again today.  Chaplain prayed with her and provided listening as she shared about her love of music, her career as a Art therapist, and how music has been meaningful to her in times of need.  She shared about a particular time in which music played a major role in her healing. Chaplain invited Donna Bailey to sing with her and Donna Bailey was grateful for the opportunity to sing together.  [address] [place] Pager, [phone]

## 2022-08-28 NOTE — TOC Progression Note (Signed)
Transition of Care [facility]) - Progression Note    Patient Details  Name: Donna Bailey MRN: [id number] Date of Birth: April 11, 1946  Transition of [facility]) CM/SW [place], LCSW Phone Number: 08/28/2022, 4:17 PM  Clinical Narrative:    Spoke with pt via t/c to room. CSW reviewed bed offers for SNF with pt. Pt is interested in SNF placement at [facility] however, requested to speak to staff member first. Pt asked what the alternative plan would be if she decided she did not want to go to SNF. CSW shared that she would likely go home w/ home health if she declines SNF. CSW contacted [facility] and requested for them to give pt a call.  CSW discussed recommendation for palliative care. Pt agreeable to this and a referral has been made to [facility].  CSW encouraged pt to make decision for SNF as insurance authorization will have to be requested and per EDD is medically ready to discharge tomorrow.    Expected Discharge Plan: [place] Barriers to Discharge: Continued Medical Work up  Expected Discharge Plan and Services In-house Referral: Clinical Social Work Discharge Planning Services: CM Consult Post Acute Care Choice: [place] Living arrangements for the past 2 months: Single Family Home                 DME Arranged: N/A DME Agency: NA                   Social Determinants of Health (SDOH) Interventions SDOH Screenings   Depression (PHQ2-9): Low Risk  (03/15/2020)  Tobacco Use: Low Risk  (08/24/2022)    Readmission Risk Interventions    11/27/2021   11:39 AM  Readmission Risk Prevention Plan  Transportation Screening Complete  PCP or Specialist Appt within 5-7 Days Complete  Home Care Screening Complete  Medication Review (RN CM) Complete

## 2022-08-28 NOTE — Progress Notes (Addendum)
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 In ED:was found to be hypoglycemic as well as hypokalemia, hypomagnesemia  w/ acidosis with concerns for starvation ketosis with an elevated beta hydroxybutyrate.  Patient was admitted for further management of electrolyte imbalance starvation ketosis and failure to thrive S/p intravenous volume resuscitation and electrolyte replacement, GI has been consulted> underwent EGD> and CT abdomen pelvis without new abnormality and no signs of GI problem to explain-DVT and GI signed off. Due to concern for psychiatric cause of patient's aversion to food psychiatry has been consulted.  Concern for psych cause, possible PTSD surrounding prior MI that occurred when she tried to eat and fixation with death.  Subjective: Seen this am Overnight patient has been afebrile Reports she did well yesterday w/  11 bites for breakfast, 16 for lunch and same for supper Having neck pain this am not feeling well. Drank 2 bottles of water yesterday and trying to drink chocolate shake  Assessment and Plan: Principal Problem:   Failure to thrive in adult Active Problems:   Ketosis (HCC)   Protein-calorie malnutrition, severe (HCC)   Hypokalemia due to inadequate potassium intake   Hypomagnesemia   Coronary artery disease involving native coronary artery of native heart without angina pectoris   Chronic diastolic CHF (congestive heart failure)  (HCC)   Normocytic anemia   Dysphagia   Generalized weakness   Loss of weight   Early satiety   Fundic gland polyps of stomach, benign   Adult failure to thrive Starvation ketoacidosis Inadequate oral intake Aversion to food: Patient underwent extensive workup with GI evaluation, EGD that is unchanged from EGD from 2022, CT of the abdomen and pelvis with IV and oral contrast unrevealing for any cause of patient's presentation.  Wondering if psychiatric issues is contributing to her symptoms- psychiatry has been consulted> no immediate need for inpatient psych, psych has signed off advised to increase and titrate Remeron nightly, sleeping aid, currently no signs of withdrawal, has been on Medication chronically requesting to resume.  Awaiting palliative care consult.  Dietitian following placed on 44hr calorie count 1/24.  Wondering if she has disordered eating pattern refusing to eat certain food item such as corn products, analyzing food labels counting bites of meal trays and then spitting out because of fat content. Continue follow-up for discussion calorie count.  Awaiting further goals of care discussion Continue PPI, Pepto-Bismol, Lidoderm, nutritional supplement and augment diet.Encourage oral intake.  Chronic Neck pain/Back pain: Known DJD,continue pain management encourage ambulation.Reports she is scheduled to see a new "back doctor ".  She has multiple medication allergies including Tylenol.  Continue lidocaine patch.  Hypokalemia-repleted. Hypomagnesemia:repleted. Recent Labs  Lab 08/22/22 1105 08/22/22 1214 08/22/22 2102 08/22/22 2154 08/23/22 3235 08/24/22 1045 08/25/22 0731 08/26/22 0610 08/27/22 0610  K 2.7*   < > 3.1*   < > 3.7 3.2* 3.3* 3.8 3.4*  CALCIUM 8.3*  --  7.8*   < > 7.8* 8.4* 7.8* 7.8* 7.8*  MG 1.6*  --  2.1  --  2.9* 2.0  1.6*  --   --   PHOS  --   --  1.6*  --   --   --   --   --   --    < > = values in this interval not displayed.    CAD: No chest pain  continue aspirin 81 Chronic diastolic CHF:.BNP elevated in 800s> chest x-ray 1/22 clear, and remains euvolemic.Monitor fluid status/daily weight. Net IO Since Admission: 2,630.44 mL [08/28/22 1313]  Filed Weights   08/26/22 0500 08/27/22 0500 08/28/22 0616  Weight: 65 kg 62.6 kg 64.3 kg    Normocytic anemia: Iron B12 ferritin elevated.Hemoglobin stable Recent Labs  Lab 08/23/22 0608 08/24/22 1045 08/25/22 0731 08/26/22 0610 08/27/22 0610  HGB 11.3* 13.7 11.1* 10.9* 11.1*  HCT 31.9* 40.3 32.7* 30.9* 32.0*   Severe protein calorie malnutrition: dietitian input appreciated.  Wondering if she has disordered eating pattern refusing to eat certain food item such as corn products, analyzing food labels counting bites of meal trays and then spitting out because of fat content. Continue follow-up for discussion calorie count Inadequate oral intake:Dietitian input appreciated no malnutrition currently but at risk. Her weight overall is stable/up Nutrition Problem: Severe Malnutrition Etiology: chronic illness Signs/Symptoms: moderate fat depletion, moderate muscle depletion, energy intake < or equal to 75% for > or equal to 1 month Interventions: Boost Breeze, Magic cup, MVI, Education  DVT prophylaxis: heparin injection 5,000 Units Start: 08/22/22 2200 Code Status:   Code Status: DNR Family Communication: plan of care discussed with patient at bedside. Patient status is: Inpatient because of ongoing evaluation for FTT Level of care: Med-Surg   Dispo: The patient is from: home            Anticipated disposition: needs SNF  Objective: Vitals last 24 hrs: Vitals:   08/27/22 1212 08/27/22 1939 08/27/22 2022 08/28/22 0616  BP: (!) 113/58 (!) 100/59 (!) 101/56 (!) 149/83  Pulse: 99 (!) 110 (!) 104 93  Resp: '16 18 16 18  '$ Temp: 97.9 F (36.6 C) 98.1 F (36.7 C) (!) 97.5 F (36.4 C) 97.7 F (36.5 C)  TempSrc: Oral Oral Oral Oral  SpO2: 97% 95% 95% 98%  Weight:    64.3 kg  Height:        Weight change: 1.7 kg  Physical Examination: General exam: AAox3, weak,older appearing HEENT:Oral mucosa moist, Ear/Nose WNL grossly, dentition normal. Respiratory system: bilaterally clear BS, no use of accessory muscle Cardiovascular system: S1 & S2 +, regular rate,. Gastrointestinal system: Abdomen soft,NT,ND,BS+ Nervous System:Alert, awake, moving extremities and grossly nonfocal Extremities: LE ankle edema neg, lower extremities warm Skin: No rashes,no icterus. MSK: Normal muscle bulk,tone, power   Medications reviewed:  Scheduled Meds:  amitriptyline  25 mg Oral QHS   aspirin  81 mg Oral Daily   cyanocobalamin  1,000 mcg Intramuscular Q Sun   feeding supplement  1 Container Oral TID BM   folic acid  1 mg Oral Daily   heparin  5,000 Units Subcutaneous Q8H   lidocaine  2 patch Transdermal Q24H   mirtazapine  7.5 mg Oral QHS   multivitamin  15 mL Oral Daily   pantoprazole  40 mg Oral Daily  Continuous Infusions: Unresulted Labs (From admission, onward)     Start     Ordered   08/29/22 0500  VITAMIN D 25 Hydroxy (Vit-D Deficiency, Fractures)  Tomorrow morning,   R       Question:  Specimen collection method  Answer:  Lab=Lab collect  08/28/22 1254   08/25/22 1404  Rapid urine drug screen (hospital performed)  Add-on,   AD        08/25/22 1403   08/22/22 1936  Urinalysis, Routine w reflex microscopic  Once,   R        08/22/22 1935          Data Reviewed: I have personally reviewed following labs and imaging studies CBC: Recent Labs  Lab 08/22/22 1105 08/22/22 1214 08/22/22 2102 08/23/22 0608 08/24/22 1045 08/25/22 0731 08/26/22 0610 08/27/22 0610  WBC 6.8  --  6.5 6.7 6.8 5.2 6.0 5.7  NEUTROABS 4.0  --  3.3  --  2.2 1.7  --   --   HGB 13.1   < > 11.0* 11.3* 13.7 11.1* 10.9* 11.1*  HCT 37.4   < > 30.9* 31.9* 40.3 32.7* 30.9* 32.0*  MCV 94.0  --  93.6 93.3 95.7 96.7 95.1 96.4  PLT 320  --  PLATELET CLUMPS NOTED ON SMEAR, UNABLE TO ESTIMATE 273 339 260  270 255   < > = values in this interval not displayed.  Basic Metabolic Panel: Recent Labs  Lab 08/22/22 1105 08/22/22 1214 08/22/22 2102 08/22/22 2154 08/23/22 0608 08/24/22 1045 08/25/22 0731 08/26/22 0610 08/27/22 0610  NA 138   < > 133*   < > 134* 138 137 136 137  K 2.7*   < > 3.1*   < > 3.7 3.2* 3.3* 3.8 3.4*  CL 94*  --  96*   < > 96* 101 104 104 106  CO2 25  --  26   < > '27 27 26 26 24  '$ GLUCOSE 69*  --  72   < > 131* 96 151* 93 82  BUN 5*  --  <5*   < > <5* <5* <5* <5* <5*  CREATININE 0.73  --  0.66   < > 0.72 0.84 0.90 0.92 1.05*  CALCIUM 8.3*  --  7.8*   < > 7.8* 8.4* 7.8* 7.8* 7.8*  MG 1.6*  --  2.1  --  2.9* 2.0 1.6*  --   --   PHOS  --   --  1.6*  --   --   --   --   --   --    < > = values in this interval not displayed.   GFR: Estimated Creatinine Clearance: 41.7 mL/min (A) (by C-G formula based on SCr of 1.05 mg/dL (H)). Liver Function Tests: Recent Labs  Lab 08/22/22 1105 08/22/22 2102 08/23/22 0608 08/24/22 1045 08/25/22 0731  AST '17 24 22 '$ 37 41  ALT '8 13 9 14 15  '$ ALKPHOS 61 59 60 73 54  BILITOT 0.7 0.8 0.6 0.5 0.5  PROT 5.3* 4.7* 4.4* 5.3* 4.0*  ALBUMIN 2.5* 1.9* 1.8* 2.1* 1.5*   Recent Labs  Lab 08/22/22 1105  LIPASE <10*   Recent Labs    09/25/21 1034  PROBNP 817*   No results for input(s): "HGBA1C" in the last 72 hours. Culture/Microbiology    Component Value Date/Time   SDES URINE, CLEAN CATCH 07/25/2022 0330   SPECREQUEST NONE 07/25/2022 0330   CULT  07/25/2022 0330    NO GROWTH Performed at [facility] [address], [place],  [id number]    REPTSTATUS 07/27/2022 FINAL 07/25/2022 0330  Radiology Studies: No results found.   LOS: 5 days   Antonieta Pert, MD [facility]  08/28/2022, 1:13 PM

## 2022-08-28 NOTE — Progress Notes (Addendum)
Nutrition Follow-up  DOCUMENTATION CODES:   Severe malnutrition in context of chronic illness  INTERVENTION:   -RD concerned about disordered eating patterns with patient. Pt refusing to eat certain food items such as corn products and analyzing food labels. Pt counting bites of meal trays. Pt eating bites then spitting out because of fat content.  -Will check a Vitamin D lab level given poor PO  -Continue Multivitamin with minerals daily  -Asked staff to bring in supplements in unlabeled cups so pt cannot ready food labels  -Spoke with patient about feeding tube, seemed only open to feeding tube if it would help her chances of having back surgery  -Recommend goals of care discussion  -Calorie Count continues  NEW NUTRITION DIAGNOSIS:   Severe Malnutrition related to chronic illness as evidenced by moderate fat depletion, moderate muscle depletion, energy intake < or equal to 75% for > or equal to 1 month.  GOAL:   Patient will meet greater than or equal to 90% of their needs  Not meeting.  MONITOR:   PO intake, Supplement acceptance, Skin, Labs  REASON FOR ASSESSMENT:   Consult Calorie Count  ASSESSMENT:   77 y.o. female presented to the ED with inability to tolerate PO intake. PMH includes CKD II, CHF, GERD with Barrett's esophagus, CAD, pancreatitis, and HTN. Pt admitted with decreased oral intake, hypokalemia, hypomagnesemia, and ketosis.  Spoke with patient at bedside to inquire about recent PO intakes.  Pt with breakfast tray untouched at bedside. RD presented plate to patient and pt proceeded to only take 1 bite of french toast and couldn't eat anymore. Pt sucked on an orange slice without consuming.  Pt reports she has had back problems since last year. States she was told by her GI doctor she has a pinched nerve in her stomach as well as her pancreas does not produce the enzymes she needs.  Reviewed last GI note on 1/9 in chart review, documented that pt has  had chronic nausea and vomiting intermittently for years. During that visit, pt reports she wasn't eating that much since Thanksgiving. Noted vertebral fracture in medical history.  RD asked if pt was consuming protein supplements. Pt states she doesn't like Ensure or Boost. At most she can drink ~1 bottle of the Boost Breeze. When shown the Ensure, pt asked RD to look at food label which lists "corn maltodextrin" as one of the top ingredients. Pt states she will not eat things with corn or corn syrups in them.  Pt was brought a protein shake by tech made of Ensure and chocolate ice cream and she drank some of this but then states it started to hurt her stomach.  Suggested to staff to not bring supplements in bottles with labels.   Pt was then asked if she would consume ice cream, pudding, or anything else with protein. Pt answered with, she would eat "jello".   Pt reports she really craved a pizza  so she ordered one for dinner last night. States she saw so much grease on it she didn't really want to eat it but picked off a few of the toppings, chewed them in her mouth and spit them back out to "get the flavor". Proceeded to say it was a good pizza.  Pt reports intakes in bites. She reports she ate 4 bites of lunch and 1 bite of her breakfast. States she considers a nibble, a bite.  We discussed feeding tubes and pt's thoughts on them, she said she talked  with her niece who is a NP. Her niece felt that pt did not need a feeding tube. Her brother told her she could pursue hospice. Pt states she would like to pursue surgery for her back issues and would consider a feeding tube if she could have this done. Told patient she needs to make sure she is in good nutritional standing before a surgery. Pt still didn't seem motivated to eat more.   Calorie Count results, 1/24: Breakfast: 60 kcals, 4g protein Lunch: 125 kcals, 0g protein Dinner: insubstantial amount Supplements: 75% of Boost Breeze =185 kcals,  6g protein Total: 370 kcals, 10g protein  Admission weight: 130 lbs Current weight: 141 lbs Per patient, she did weigh as low as 120 lbs. Thinks she has gained weight.   Medications: Folic acid, Remeron, MVI  Labs reviewed:  CBGs: 103-135 Low K  NUTRITION - FOCUSED PHYSICAL EXAM:  Flowsheet Row Most Recent Value  Orbital Region Moderate depletion  Upper Arm Region Severe depletion  Thoracic and Lumbar Region Moderate depletion  Buccal Region Moderate depletion  Temple Region Moderate depletion  Clavicle Bone Region Moderate depletion  Clavicle and Acromion Bone Region Moderate depletion  Scapular Bone Region Moderate depletion  Dorsal Hand Severe depletion  Patellar Region Moderate depletion  Anterior Thigh Region Moderate depletion  Posterior Calf Region Moderate depletion  Edema (RD Assessment) Mild  Hair Reviewed  Eyes Reviewed  Mouth Reviewed  Skin Reviewed  Nails Reviewed       Diet Order:   Diet Order             Diet regular Room service appropriate? Yes; Fluid consistency: Thin  Diet effective now                   EDUCATION NEEDS:   Education needs have been addressed  Skin:  Skin Assessment: Reviewed RN Assessment  Last BM:  1/24  Height:   Ht Readings from Last 1 Encounters:  08/22/22 '5\' 5"'$  (1.651 m)    Weight:   Wt Readings from Last 1 Encounters:  08/28/22 64.3 kg    BMI:  Body mass index is 23.59 kg/m.  Estimated Nutritional Needs:   Kcal:  8786-7672  Protein:  90-100g  Fluid:  >1.7L/day  Clayton Bibles, MS, RD, LDN Inpatient Clinical Dietitian Contact information available via Amion

## 2022-08-28 NOTE — Care Management Important Message (Signed)
Important Message  Patient Details IM Letter given. Name: Donna Bailey MRN: [id number] Date of Birth: 16-May-1946   Medicare Important Message Given:  Yes     [name] 08/28/2022, 11:39 AM

## 2022-08-28 NOTE — Consult Note (Signed)
Consultation Note Date: 08/28/2022   Patient Name: Donna Bailey  DOB: 14-Jul-1946  MRN: [id number]  Age / Sex: 77 y.o., female  PCP: [name], MD Referring Physician: Antonieta Pert, MD  Reason for Consultation: Establishing goals of care  HPI/Patient Profile: 77 y.o. female admitted on 08/22/2022   Donna Bailey is a 77 y.o. female patient   She lives at home with her husband of 70 years, husband was recently discharged from [facility] rehab.She has no children, however has helped raised her 3 step children (former marriage). She studied at [employer] in [employer]. She retired from [employer] in 1991, as a Art therapist.  Patient has a past medical history significant for short segment Barrett's esophagus seen on EGD June 2022, pancreatic atrophy seen on CT scan November 2022.  She has a history of coronary artery disease status post STEMI with LAD PCI in April 2019 with catheterization complicated by radial artery dissection and hematoma, she has ischemic cardiomyopathy systolic and diastolic heart failure, patient admitted to hospital medicine service with nausea vomiting inability to tolerate oral intake.  In the emergency room she was found to be hypoglycemic, and with electrolyte abnormalities such as hypokalemia and hypomagnesemia with concerns for starvation ketosis with elevated beta hydroxybutyrate.  Clinical Assessment and Goals of Care: Hospital course: Patient was seen and evaluated by GI, underwent EGD, underwent CT abdomen and pelvis showing no new abnormality no signs of GI problem.  Patient was also seen by psychiatry because of food aversion, recommended to try mirtazapine at higher doses.  Patient being followed by dietary services, and calorie count is underway.  Palliative consult for goals of care discussions has been requested. Patient goes by Donna Bailey.  She is awake  alert resting in bed.  Introduced myself and palliative care as follows: Palliative medicine is specialized medical care for people living with serious illness. It focuses on providing relief from the symptoms and stress of a serious illness. The goal is to improve quality of life for both the patient and the family. Goals of care: Broad aims of medical therapy in relation to the patient's values and preferences. Our aim is to provide medical care aimed at enabling patients to achieve the goals that matter most to them, given the circumstances of their particular medical situation and their constraints.    HCPOA  Husband Shirin Echeverry.   Discussion/SUMMARY OF RECOMMENDATIONS   Goals of care discussions undertaken with the patient in detail today at the time of initial consultation.  Patient states that she has a brother who lives in [place] and has somewhat of a medical background.  She asked for me to describe the differences between hospice and palliative.  Differences explained to the best of my ability.  She asks what would be the potential advantages of going to a skilled nursing facility to attend rehabilitation.  She asks about the type of care that can be given inside a residential hospice facility.  All of her questions addressed to the best  of my ability. Overall, patient states that she wants to explore getting better, she does not want hospice, she would like to go to [facility] facility if at all possible and work on physical therapy.  She states that she has to meet with a spine specialist and another [address] [place] to determine whether she would be a candidate for back surgery or not.  She does not want to make any decision with regards to feeding tube insertion until she can have these outpatient consultations with back surgeons completed.  If she is not a candidate for back surgery, then she will consider hospice route and comfort measures.  If she is able to complete some rehab at the SNF  and is considered a surgical candidate, then, at that time, she will consider PEG tube placement. Recommend SNF rehab with palliative on discharge. Advance care planning documents are noted on the chart. Thank you for the consult.  Code Status/Advance Care Planning: DNR   Symptom Management:     Palliative Prophylaxis:  Delirium Protocol  Additional Recommendations (Limitations, Scope, Preferences): Full Scope Treatment  Psycho-social/Spiritual:  Desire for further Chaplaincy support:yes Additional Recommendations: Caregiving  Support/Resources  Prognosis:  Unable to determine  Discharge Planning: [place] for rehab with Palliative care service follow-up      Primary Diagnoses: Present on Admission:  Normocytic anemia  Coronary artery disease involving native coronary artery of native heart without angina pectoris  Chronic diastolic CHF (congestive heart failure) (HCC)  Ketosis (HCC)  Hypokalemia due to inadequate potassium intake  Hypomagnesemia  Protein-calorie malnutrition, severe ([place])  Failure to thrive in adult   I have reviewed the medical record, interviewed the patient and family, and examined the patient. The following aspects are pertinent.  Past Medical History:  Diagnosis Date   Allergic rhinitis, cause unspecified    Anemia    Aneurysm of right conjunctiva    right eye    Anxiety    Asthma    Barrett's esophagus    CAD in native artery    a. 11/2017: STEMI s/p DES to prox-mid LAD; LCx stenosis managed medically. Case complicated by R forearm hematoma. b. Several subsequent caths, last in 04/2019 with stable disease // Myoview 11/2019: EF 61, normal perfusion; low risk    CKD (chronic kidney disease), stage II    Constipation    Cystocele    Deviated nasal septum    Diaphragmatic hernia without mention of obstruction or gangrene    Esophageal reflux    Fibromyalgia    H/O hiatal hernia    Hypertension    Insomnia, unspecified     Ischemic cardiomyopathy    a. EF 40-45% by echo 11/2017. // Echo 5/19: No new wall motion abnormalities, EF 65, no pericardial effusion, normal aortic root   Kidney stones    hx of pt see Dr. Risa Grill   Medication intolerance    numerous   Migraine    Myalgia and myositis, unspecified    Myocardial infarct ([place]) 11/13/2017   Myocardial infarction [facility])    Nuclear stress tests    Cardiolite 2/18: no ischemia or scar, EF 78; Low Risk   Osteoarthrosis, unspecified whether generalized or localized, unspecified site    Pancreatitis    Peripheral neuropathy    Pneumonia    hx of   Pure hypercholesterolemia    Rectocele    Scoliosis (and kyphoscoliosis), idiopathic    Statin intolerance    Temporomandibular joint disorders, unspecified    Upper respiratory infection,  acute 04/15/2018   Urticaria    Wheat allergy    Social History   Socioeconomic History   Marital status: Married    Spouse name: Not on file   Number of children: 0   Years of education: Not on file   Highest education level: Not on file  Occupational History   Occupation: retired    Fish farm manager: RETIRED  Tobacco Use   Smoking status: Never    Passive exposure: Never   Smokeless tobacco: Never  Vaping Use   Vaping Use: Never used  Substance and Sexual Activity   Alcohol use: No    Alcohol/week: 0.0 standard drinks of alcohol   Drug use: No   Sexual activity: Never  Other Topics Concern   Not on file  Social History Narrative   Lives with husband in a 2 story home.  Has no children.  Retired Art therapist.  Education: college. Right handed    Social Determinants of Health   Financial Resource Strain: Not on file  Food Insecurity: Not on file  Transportation Needs: Not on file  Physical Activity: Not on file  Stress: Not on file  Social Connections: Not on file   Family History  Problem Relation Age of Onset   Colitis Mother    Heart disease Mother    Diverticulosis Mother    Heart disease Father     Breast cancer Sister    Cancer Sister        breast   Leukemia Sister    Cancer Sister 48       leukemia   Colon polyps Brother    Tuberculosis Brother    Stomach cancer Neg Hx    Rectal cancer Neg Hx    Esophageal cancer Neg Hx    Colon cancer Neg Hx    Scheduled Meds:  amitriptyline  25 mg Oral QHS   aspirin  81 mg Oral Daily   cyanocobalamin  1,000 mcg Intramuscular Q Sun   feeding supplement  1 Container Oral TID BM   folic acid  1 mg Oral Daily   heparin  5,000 Units Subcutaneous Q8H   lidocaine  2 patch Transdermal Q24H   mirtazapine  7.5 mg Oral QHS   multivitamin  15 mL Oral Daily   pantoprazole  40 mg Oral Daily   Continuous Infusions: PRN Meds:.acetaminophen **OR** acetaminophen, albuterol, alum & mag hydroxide-simeth, bismuth subsalicylate, guaiFENesin-codeine, nitroGLYCERIN, ondansetron **OR** ondansetron (ZOFRAN) IV, [DISCONTINUED]  morphine injection **OR** oxyCODONE Medications Prior to Admission:  Prior to Admission medications   Medication Sig Start Date End Date Taking? Authorizing Provider  amitriptyline (ELAVIL) 25 MG tablet Take 25 mg by mouth at bedtime.   Yes [provider]  aspirin 81 MG chewable tablet Chew 81 mg by mouth daily.   Yes [provider]  bismuth subsalicylate (PEPTO BISMOL) 262 MG chewable tablet Chew 262-524 mg by mouth as needed for indigestion or diarrhea or loose stools.   Yes [provider]  clindamycin (CLEOCIN) 150 MG capsule Take 150 mg by mouth every 8 (eight) hours. 08/21/22 08/31/22 Yes [provider]  cyanocobalamin (,VITAMIN B-12,) 1000 MCG/ML injection Inject 1,000 mcg into the muscle every Sunday.   Yes [provider]  diphenhydrAMINE (BENADRYL) 25 mg capsule Take 25 mg by mouth daily as needed for sleep or itching.   Yes [provider]  famotidine (PEPCID) 40 MG tablet Take 40 mg by mouth daily as needed for heartburn or indigestion.   Yes [provider]   guaiFENesin-codeine 100-10 MG/5ML syrup Take 2.5-5 mLs by mouth daily as needed (Cold). Patient taking differently: Take 5 mLs by mouth at bedtime. 04/29/22  Yes Young, Tarri Fuller D, MD  hydrALAZINE (APRESOLINE) 25 MG tablet Take 1 tablet (25 mg total) by mouth daily as needed (for BP greater than 323 systolic). 09/11/20  Yes Dorothy Spark, MD  lidocaine (LIDODERM) 5 % Place 0.5-1 patches onto the skin daily as needed (pain). Apply 0.5-1 patch to spine and affected shoulders daily as needed for pain- remove when directed 11/04/20  Yes [provider]  NITRO-BID 2 % ointment Apply 1 g topically See admin instructions. Apply 1 gram daily to the finger tips as directed 10/31/21  Yes [provider]  nitroGLYCERIN (NITROSTAT) 0.4 MG SL tablet Place 1 tablet (0.4 mg total) under the tongue every 5 (five) minutes as needed for chest pain. 11/16/17  Yes Simmons, Brittainy M, PA-C  NON FORMULARY Take 1 tablet by mouth See admin instructions. MagBlue tablets- Take 1 tablet by mouth once a day   Yes [provider]  ondansetron (ZOFRAN-ODT) 4 MG disintegrating tablet Take 1 tablet (4 mg total) by mouth every 6 (six) hours as needed for nausea or vomiting. 07/29/22  Yes Esterwood, Amy S, PA-C  oxyCODONE (OXY IR/ROXICODONE) 5 MG immediate release tablet Take 1-2 tablets (5-10 mg total) by mouth every 6 (six) hours as needed for moderate pain or severe pain. 11/27/21  Yes Laurence Slate M, PA-C  potassium chloride SA (KLOR-CON M) 20 MEQ tablet Take 1 tablet (20 mEq total) by mouth 2 (two) times daily for 5 days. Patient not taking: Reported on 08/22/2022 07/30/22 08/22/22  Esterwood, Amy S, PA-C  zolpidem (AMBIEN) 10 MG tablet Take 10 mg by mouth at bedtime.   Yes [provider]  carvedilol (COREG) 3.125 MG tablet Take 1 tablet (3.125 mg total) by mouth 2 (two) times daily. Patient not taking: Reported on 08/22/2022 06/10/22 06/10/23  Richardson Dopp T, PA-C  esomeprazole (NEXIUM) 40 MG  capsule TAKE 1 CAPSULE BY MOUTH TWICE A DAY Patient not taking: Reported on 08/22/2022 01/06/22   Zehr, Laban Emperor, PA-C  furosemide (LASIX) 40 MG tablet Take 1.5 tablets (60 mg total) by mouth daily. Patient not taking: Reported on 08/22/2022 09/26/21   Richardson Dopp T, PA-C  Gabapentin 10 % CREA Apply 1-2 application. topically at bedtime. Feet Patient not taking: Reported on 08/22/2022    [provider]  HYDROMET 5-1.5 MG/5ML syrup take 108ms BY MOUTH EVERY 6 HOURS AS NEEDED FOR COUGH Patient not taking: Reported on 08/22/2022 08/05/22   YDeneise Lever MD  ondansetron (ZOFRAN) 4 MG tablet Take 1 tablet (4 mg total) by mouth every 8 (eight) hours as needed for nausea or vomiting. Patient not taking: Reported on 08/22/2022 11/28/20   Zehr, JLaban Emperor PA-C  Probiotic Product (PROBIOTIC DAILY PO) Take 1 tablet by mouth daily. Patient not taking: Reported on 08/22/2022    [provider]  promethazine-codeine (PHENERGAN WITH CODEINE) 6.25-10 MG/5ML syrup Take 5 mLs by mouth every 6 (six) hours as needed for cough. Patient not taking: Reported on 08/22/2022 09/03/21   YDeneise Lever MD  ticagrelor (BRILINTA) 90 MG TABS tablet Take 1 tablet (90 mg total) by mouth 2 (two) times daily. Patient not taking: Reported on 08/22/2022 11/27/21   CUlyses Amor PA-C   Allergies  Allergen Reactions   Cephalosporins Itching and Other (See Comments)    Tolerated cefdinir in 2019  Crestor [Rosuvastatin] Other (See Comments)    Lost all muscle mobility    Doxycycline Diarrhea and Nausea And Vomiting   Formoterol Other (See Comments)    Reaction not recalled   Other Anaphylaxis, Itching, Rash and Other (See Comments)    Polyester-"pins sticking in her skin    Sulfa Antibiotics Anaphylaxis   Sulfonamide Derivatives Anaphylaxis   Ciprofloxacin Other (See Comments) and Rash   Nitrofurantoin Diarrhea, Nausea And Vomiting and Other (See Comments)    Also, "convulsions/constant shaking"- per patient    Penicillins Rash    Underarms (both) Has patient had a PCN reaction causing immediate rash, facial/tongue/throat swelling, SOB or lightheadedness with hypotension: YES Has patient had a PCN reaction causing severe rash involving mucus membranes or skin necrosis: NO Has patient had a PCN reaction that required hospitalization NO Has patient had a PCN reaction occurring within the last 10 years: NO If all of the above answers are "NO", then may proceed with Cephalosporin use. Other reaction(s): Other (See Comments) Underarms (both) Other Reaction: "red hot skin" Underarms (both) Has patient had a PCN reaction causing immediate rash, facial/tongue/throat swelling, SOB or lightheadedness with hypotension: YES Has patient had a PCN reaction causing severe rash involving mucus membranes or skin necrosis: NO Has patient had a PCN reaction that required hospitalization NO Has patient had a PCN reaction occurring within the last 10 years: NO If all of the above answers are "NO", then may proceed with Cephalosporin use.   Prednisone Itching    Pt states she cannot take prednisone with corn filler 05/19/2019.   Amlodipine Swelling and Other (See Comments)    Swelling in legs   Caffeine Other (See Comments)    Heart races   Carvedilol     dry mouth   Cephalexin Other (See Comments)    Reaction not recalled by the patient   Dilt-Xr [Diltiazem Hcl]     LE edema   Esomeprazole Other (See Comments)    Reaction not cited   Formoterol Fumarate Other (See Comments)    Reaction not recalled   Fosfomycin     Nerve pain in her legs   Gold Sodium Thiosulfate Other (See Comments)    Positive patch test   Gold-Containing Drug Products Other (See Comments)    Skin tingles   Hydralazine    Levofloxacin In D5w Other (See Comments)    Racing heart   Macrodantin [Nitrofurantoin Macrocrystal] Itching   Moxifloxacin Other (See Comments)    Caused hands to "shake"   Neomycin Other (See Comments)     Doesn't remember - allergist said not to use it because it could add more allergies- Positive patch test    Ofloxacin Itching and Other (See Comments)   Praluent [Alirocumab] Other (See Comments)    "Shaking"   Pregabalin Other (See Comments)   Ranexa [Ranolazine Er]     itching   Repatha [Evolocumab]     shaking   Valsartan Other (See Comments)    Pt reports this med causes her to feel sluggish and she feels heaviness on her shoulders.   Welchol [Colesevelam] Other (See Comments)    Reaction not cited   Zetia [Ezetimibe]    Zolpidem     Can tolerate in 2024   Acetaminophen Other (See Comments) and Rash    Facial rash   Fexofenadine Palpitations and Other (See Comments)    Heart races   Ibuprofen Rash   Latex Rash and Other (See Comments)    Skin gets  red    Levofloxacin Other (See Comments) and Palpitations    HEART RACING   Mold Extract [Trichophyton Mentagrophyte] Other (See Comments)    Bumps on back, stops up sinuses.   Molds & Smuts Rash and Other (See Comments)    Bumps on back, stops up sinuses.   Nickel Rash   Tape Rash   Trichophyton Rash and Other (See Comments)    Bumps on back, stops up sinuses   Review of Systems Complains of back pain Physical Exam Awake alert Resting in bed Appears with generalized weakness Appears with moderate muscle depletion Regular work of breathing Abdomen not distended No edema  Vital Signs: BP (!) 107/59 (BP Location: Right Arm)   Pulse 98   Temp 97.6 F (36.4 C) (Oral)   Resp 18   Ht '5\' 5"'$  (1.651 m)   Wt 64.3 kg   SpO2 95%   BMI 23.59 kg/m  Pain Scale: 0-10 POSS *See Group Information*: S-Acceptable,Sleep, easy to arouse Pain Score: Asleep   SpO2: SpO2: 95 % O2 Device:SpO2: 95 % O2 Flow Rate: .O2 Flow Rate (L/min): 4 L/min  IO: Intake/output summary: No intake or output data in the 24 hours ending 08/28/22 1354  LBM: Last BM Date : 08/27/22 Baseline Weight: Weight: 59 kg Most recent weight: Weight: 64.3 kg      Palliative Assessment/Data:   PPS 50%  Time In:  1300 Time Out:  1400 Time Total:  60  Greater than 50%  of this time was spent counseling and coordinating care related to the above assessment and plan.  Signed by: Loistine Chance, MD   Please contact Palliative Medicine Team phone at [phone] for questions and concerns.  For individual provider: See Shea Evans

## 2022-08-29 DIAGNOSIS — R627 Adult failure to thrive: Secondary | ICD-10-CM | POA: Diagnosis not present

## 2022-08-29 MED ORDER — ADULT MULTIVITAMIN LIQUID CH: 15.0000 mL | Freq: Every day | ORAL | Status: DC

## 2022-08-29 MED ORDER — OXYCODONE HCL 5 MG PO TABS: 5.0000 mg | ORAL_TABLET | Freq: Four times a day (QID) | ORAL | 0 refills | Status: DC | PRN

## 2022-08-29 MED ORDER — GUAIFENESIN-CODEINE 100-10 MG/5ML PO SOLN: 2.5000 mL | Freq: Every day | ORAL | 0 refills | Status: DC | PRN

## 2022-08-29 MED ORDER — FOLIC ACID 1 MG PO TABS: 1.0000 mg | ORAL_TABLET | Freq: Every day | ORAL | Status: DC

## 2022-08-29 MED ORDER — MIRTAZAPINE 7.5 MG PO TABS: 7.5000 mg | ORAL_TABLET | Freq: Every day | ORAL | Status: DC

## 2022-08-29 NOTE — Progress Notes (Signed)
[id number]  [facility]) Hospital liaison note  Notified by [facility] manager of patient/family request for [facility] Palliative services at home after discharge.  [facility] will follow patient for discharge disposition.  Please call with any hospice or outpatient palliative care related questions.   Thank you for the opportunity to participate in this patient's care.  [place]  [facility] liaison  [phone]

## 2022-08-29 NOTE — Plan of Care (Signed)

## 2022-08-29 NOTE — TOC Progression Note (Addendum)
Transition of Care [facility]) - Progression Note    Patient Details  Name: Donna Bailey MRN: [id number] Date of Birth: [birth year]  Transition of [facility]) CM/SW Contact  Braxson Hollingsworth, Juliann Pulse, RN Phone Number: 08/29/2022, 2:22 PM  Clinical Narrative:  Went into rm to discuss & encourage patient to choose ST SNF choice-list provided again-patient states she will discuss w/someone & let me know later. Informed her that [facility] has declined-she voiced understanding.Await choice for SNF.     Expected Discharge Plan: [place] Barriers to Discharge: Continued Medical Work up  Expected Discharge Plan and Services In-house Referral: Clinical Social Work Discharge Planning Services: CM Consult Post Acute Care Choice: [place] Living arrangements for the past 2 months: Single Family Home                 DME Arranged: N/A DME Agency: NA                   Social Determinants of Health (SDOH) Interventions SDOH Screenings   Depression (PHQ2-9): Low Risk  (03/15/2020)  Tobacco Use: Low Risk  (08/24/2022)    Readmission Risk Interventions    11/27/2021   11:39 AM  Readmission Risk Prevention Plan  Transportation Screening Complete  PCP or Specialist Appt within 5-7 Days Complete  Home Care Screening Complete  Medication Review (RN CM) Complete

## 2022-08-29 NOTE — Progress Notes (Signed)
Chaplain attempted follow up with Donna Bailey, but she was not feeling well and did not feel up to talking.  Chaplain Janne Napoleon, [place] PAger, [phone]

## 2022-08-29 NOTE — Progress Notes (Signed)
Physical Therapy Treatment Patient Details Name: Donna Bailey MRN: [id number] DOB: 21-Feb-1946 Today's Date: 08/29/2022   History of Present Illness Patient is a 77 year old female who presented on 1/19 with decreased oral intake, nausea and vomiting. Patient was admitted with hypoglycemia, hypokalemia, and hypomagnesia and concerns for starvation ketosis.patient underwent EGD on 1/21. PMH:CAD, chronic diastolic CHF, thoracic compression fx T11-L4 07/2022, peripheral neuropathy, MI.    PT Comments    General Comments: AxO x 3 very pleasant Lady who apperars "worried" and feels "nausea all the time and they can't figure out why".  Slow moving.  Weak.  Assisted OOB was difficult.  General bed mobility comments: required increased asisst back to bed to fully support B LE  General transfer comment: required increased time and slow to rise.  C/O B LE "Neuropathy".  General Gait Details: limited distance around room due to weakness/fatigue.  Slow moving.  Poor posture.  HIGH FALL RISK. Pt will need ST Rehab at SNF to address mobility and functional decline prior to safely returning home.   Recommendations for follow up therapy are one component of a multi-disciplinary discharge planning process, led by the attending physician.  Recommendations may be updated based on patient status, additional functional criteria and insurance authorization.  Follow Up Recommendations  Skilled nursing-short term rehab (<3 hours/day) Can patient physically be transported by private vehicle: No   Assistance Recommended at Discharge Intermittent Supervision/Assistance  Patient can return home with the following A lot of help with walking and/or transfers;A little help with bathing/dressing/bathroom;Assistance with cooking/housework;Assist for transportation;Help with stairs or ramp for entrance   Equipment Recommendations  None recommended by PT    Recommendations for Other Services       Precautions /  Restrictions Precautions Precautions: Fall Precaution Comments: profound weakness Restrictions Weight Bearing Restrictions: No     Mobility  Bed Mobility Overal bed mobility: Needs Assistance Bed Mobility: Supine to Sit, Sit to Supine     Supine to sit: Min assist, Mod assist Sit to supine: Mod assist, Max assist   General bed mobility comments: required increased asisst back to bed to fully support B LE    Transfers Overall transfer level: Needs assistance Equipment used: Rolling walker (2 wheels) Transfers: Sit to/from Stand Sit to Stand: Min assist, Mod assist           General transfer comment: required increased time and slow to rise.  C/O B LE "Neuropathy".    Ambulation/Gait Ambulation/Gait assistance: Min assist Gait Distance (Feet): 5 Feet Assistive device: Rolling walker (2 wheels) Gait Pattern/deviations: Step-to pattern, Decreased step length - right, Decreased step length - left, Drifts right/left, Trunk flexed Gait velocity: decreased     General Gait Details: limited distance around room due to weakness/fatigue.  Slow moving.  Poor posture.  HIGH FALL RISK.   Stairs             Wheelchair Mobility    Modified Rankin (Stroke Patients Only)       Balance                                            Cognition Arousal/Alertness: Awake/alert Behavior During Therapy: Anxious Overall Cognitive Status: No family/caregiver present to determine baseline cognitive functioning  General Comments: AxO x 3 very pleasant Lady who apperars "worried" and feels "nausea all the time and they can't figure out why".  Slow moving.  Weak.        Exercises      General Comments        Pertinent Vitals/Pain Pain Assessment Pain Assessment: Faces Faces Pain Scale: Hurts little more Pain Location: back with activity.  Also c/o "Neuropathy" B hands/feet Pain Descriptors / Indicators:  Aching, Grimacing, Burning Pain Intervention(s): Monitored during session, Premedicated before session, Repositioned    Home Living                          Prior Function            PT Goals (current goals can now be found in the care plan section) Progress towards PT goals: Progressing toward goals    Frequency    Min 2X/week      PT Plan Current plan remains appropriate    Co-evaluation              AM-PAC PT "6 Clicks" Mobility   Outcome Measure  Help needed turning from your back to your side while in a flat bed without using bedrails?: A Lot Help needed moving from lying on your back to sitting on the side of a flat bed without using bedrails?: A Lot Help needed moving to and from a bed to a chair (including a wheelchair)?: A Lot Help needed standing up from a chair using your arms (e.g., wheelchair or bedside chair)?: A Lot Help needed to walk in hospital room?: A Lot Help needed climbing 3-5 steps with a railing? : Total 6 Click Score: 11    End of Session Equipment Utilized During Treatment: Gait belt Activity Tolerance: Patient limited by fatigue Patient left: in bed;with call bell/phone within reach;with bed alarm set Nurse Communication: Mobility status PT Visit Diagnosis: Difficulty in walking, not elsewhere classified (R26.2);Pain;Adult, failure to thrive (R62.7)     Time: 1032-1056 PT Time Calculation (min) (ACUTE ONLY): 24 min  Charges:  $Gait Training: 8-22 mins $Therapeutic Activity: 8-22 mins                     Rica Koyanagi  PTA [place] [facility] M-F          [phone] Weekend pager [phone]

## 2022-08-29 NOTE — Progress Notes (Signed)
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 In ED:was found to be hypoglycemic as well as hypokalemia, hypomagnesemia  w/ acidosis with concerns for starvation ketosis with an elevated beta hydroxybutyrate.  Patient was admitted for further management of electrolyte imbalance starvation ketosis and failure to thrive S/p intravenous volume resuscitation and electrolyte replacement, GI has been consulted> underwent EGD> and CT abdomen pelvis without new abnormality and no signs of GI problem to explain-DVT and GI signed off. Due to concern for psychiatric cause of patient's aversion to food psychiatry has been consulted.  Concern for psych cause, possible PTSD surrounding prior MI that occurred when she tried to eat and fixation with death. Sep 16, 2022: Palliative meeting held extensively> goals of care addressed and changed to DNR, no feeding tube, plan for skilled nursing facility.  She wants to proceed with outpatient follow-up with her back surgery if not a candidate for back surgery then she will consider hospice route and comfort measures if able to complete some rehab at a SNF and if considered a surgical candidate then at that time she will consider PEG tube placement in the future. Subjective:  Seen this morning.  Somewhat anxious to eat  No new complaints.  Afebrile overnight   Assessment and Plan: Principal Problem:   Failure to thrive in adult Active Problems:    Ketosis (HCC)   Protein-calorie malnutrition, severe (HCC)   Hypokalemia due to inadequate potassium intake   Hypomagnesemia   Coronary artery disease involving native coronary artery of native heart without angina pectoris   Chronic diastolic CHF (congestive heart failure) (HCC)   Normocytic anemia   Dysphagia   Generalized weakness   Loss of weight   Early satiety   Fundic gland polyps of stomach, benign   Adult failure to thrive Starvation ketoacidosis Inadequate oral intake Aversion to food: Patient underwent extensive workup with GI evaluation, EGD that is unchanged from EGD from 2022, CT of the abdomen and pelvis with IV and oral contrast unrevealing for any cause of patient's presentation.  Wondering if psychiatric issues is contributing to her symptoms- psychiatry has been consulted> no immediate need for inpatient psych, psych has signed off advised to increase and titrate Remeron nightly, sleeping aid, currently no signs of withdrawal, has been on Medication chronically and resumed. 2022/09/16: Palliative meeting held extensively> goals of care addressed and changed to DNR, no feeding tube, plan for skilled nursing facility.  She wants to proceed with outpatient follow-up with her back surgery if not a candidate for back surgery then she will consider hospice route and comfort measures if able to complete some rehab at a SNF and if considered a surgical candidate then at that time she will consider PEG tube placement in the future.  Continue to encourage oral intake, oral supplement, PPI Pepto-Bismol Lidoderm patch nutritional support .  Chronic Neck pain/Back pain: Known DJD,continue pain management encourage ambulation.Reports she is scheduled to see a new back surgeon and she wants to proceed outpatient continue.She has multiple medication allergies including Tylenol.  Continue lidocaine patch.  Hypokalemia-repleted. Hypomagnesemia:repleted. Recent Labs  Lab 08/22/22 1105  08/22/22 1214 08/22/22 2102 08/22/22 2154 08/23/22 0608 08/24/22 1045 08/25/22 0731 08/26/22 0610 08/27/22 0610  K 2.7*   < > 3.1*   < > 3.7 3.2* 3.3* 3.8 3.4*  CALCIUM 8.3*  --  7.8*   < > 7.8* 8.4* 7.8* 7.8* 7.8*  MG 1.6*  --  2.1  --  2.9* 2.0 1.6*  --   --   PHOS  --   --  1.6*  --   --   --   --   --   --    < > = values in this interval not displayed.    CAD: No chest pain continue aspirin 81 Chronic diastolic CHF:.BNP elevated in 800s> chest x-ray 1/22 clear, and remains euvolemic.Monitor fluid status/daily weight. Net IO Since Admission: 2,630.44 mL [08/29/22 1053]  Filed Weights   08/26/22 0500 08/27/22 0500 08/28/22 0616  Weight: 65 kg 62.6 kg 64.3 kg    Normocytic anemia: Iron B12 ferritin elevated.Hemoglobin stable Recent Labs  Lab 08/23/22 0608 08/24/22 1045 08/25/22 0731 08/26/22 0610 08/27/22 0610  HGB 11.3* 13.7 11.1* 10.9* 11.1*  HCT 31.9* 40.3 32.7* 30.9* 32.0*   Severe protein calorie malnutrition: dietitian input appreciated.  Wondering if she has disordered eating pattern refusing to eat certain food item such as corn products, analyzing food labels counting bites of meal trays and then spitting out because of fat content.  Continue to augment nutritional status as below  Nutrition Problem: Severe Malnutrition Etiology: chronic illness Signs/Symptoms: moderate fat depletion, moderate muscle depletion, energy intake < or equal to 75% for > or equal to 1 month Interventions: Boost Breeze, Magic cup, MVI, Education  DVT prophylaxis: heparin injection 5,000 Units Start: 08/22/22 2200 Code Status:   Code Status: DNR Family Communication: plan of care discussed with patient at bedside. Patient status is: Inpatient because of ongoing evaluation for FTT Level of care: Med-Surg   Dispo: The patient is from: home            Anticipated disposition: Awaiting skilled nurse facility at this time.   Objective: Vitals last 24 hrs: Vitals:   08/28/22 0616  08/28/22 1325 08/28/22 2040 08/29/22 0505  BP: (!) 149/83 (!) 107/59 97/69 118/68  Pulse: 93 98 96 94  Resp: '18 18 16 16  '$ Temp: 97.7 F (36.5 C) 97.6 F (36.4 C) 98 F (36.7 C) 97.9 F (36.6 C)  TempSrc: Oral Oral Oral Oral  SpO2: 98% 95% 94% 96%  Weight: 64.3 kg     Height:       Weight change:   Physical Examination: General exam: alert awake, oriented, appears older than stated age HEENT:Oral mucosa moist, Ear/Nose WNL grossly Respiratory system: Bilaterally clear BS, no use of accessory muscle Cardiovascular system: S1 & S2 +, No JVD. Gastrointestinal system: Abdomen soft,NT,ND, BS+ Nervous System: Alert, awake, moving extremities, she follows commands. Extremities: LE edema neg,distal peripheral pulses palpable.  Skin: No rashes,no icterus. MSK: Normal muscle bulk,tone, power   Medications reviewed:  Scheduled Meds:  amitriptyline  25 mg Oral QHS   aspirin  81 mg Oral Daily   cyanocobalamin  1,000 mcg Intramuscular Q Sun   feeding supplement  1 Container Oral TID BM   folic acid  1 mg Oral Daily   heparin  5,000 Units Subcutaneous Q8H   lidocaine  2 patch Transdermal Q24H   mirtazapine  7.5 mg Oral QHS   multivitamin  15 mL Oral Daily  pantoprazole  40 mg Oral Daily  Continuous Infusions: Unresulted Labs (From admission, onward)     Start     Ordered   08/29/22 0500  VITAMIN D 25 Hydroxy (Vit-D Deficiency, Fractures)  Tomorrow morning,   R       Question:  Specimen collection method  Answer:  Lab=Lab collect   08/28/22 1254   08/25/22 1404  Rapid urine drug screen (hospital performed)  Add-on,   AD        08/25/22 1403          Data Reviewed: I have personally reviewed following labs and imaging studies CBC: Recent Labs  Lab 08/22/22 1105 08/22/22 1214 08/22/22 2102 08/23/22 0608 08/24/22 1045 08/25/22 0731 08/26/22 0610 08/27/22 0610  WBC 6.8  --  6.5 6.7 6.8 5.2 6.0 5.7  NEUTROABS 4.0  --  3.3  --  2.2 1.7  --   --   HGB 13.1   < > 11.0*  11.3* 13.7 11.1* 10.9* 11.1*  HCT 37.4   < > 30.9* 31.9* 40.3 32.7* 30.9* 32.0*  MCV 94.0  --  93.6 93.3 95.7 96.7 95.1 96.4  PLT 320  --  PLATELET CLUMPS NOTED ON SMEAR, UNABLE TO ESTIMATE 273 339 260 270 255   < > = values in this interval not displayed.  Basic Metabolic Panel: Recent Labs  Lab 08/22/22 1105 08/22/22 1214 08/22/22 2102 08/22/22 2154 08/23/22 0608 08/24/22 1045 08/25/22 0731 08/26/22 0610 08/27/22 0610  NA 138   < > 133*   < > 134* 138 137 136 137  K 2.7*   < > 3.1*   < > 3.7 3.2* 3.3* 3.8 3.4*  CL 94*  --  96*   < > 96* 101 104 104 106  CO2 25  --  26   < > '27 27 26 26 24  '$ GLUCOSE 69*  --  72   < > 131* 96 151* 93 82  BUN 5*  --  <5*   < > <5* <5* <5* <5* <5*  CREATININE 0.73  --  0.66   < > 0.72 0.84 0.90 0.92 1.05*  CALCIUM 8.3*  --  7.8*   < > 7.8* 8.4* 7.8* 7.8* 7.8*  MG 1.6*  --  2.1  --  2.9* 2.0 1.6*  --   --   PHOS  --   --  1.6*  --   --   --   --   --   --    < > = values in this interval not displayed.   GFR: Estimated Creatinine Clearance: 41.7 mL/min (A) (by C-G formula based on SCr of 1.05 mg/dL (H)). Liver Function Tests: Recent Labs  Lab 08/22/22 1105 08/22/22 2102 08/23/22 0608 08/24/22 1045 08/25/22 0731  AST '17 24 22 '$ 37 41  ALT '8 13 9 14 15  '$ ALKPHOS 61 59 60 73 54  BILITOT 0.7 0.8 0.6 0.5 0.5  PROT 5.3* 4.7* 4.4* 5.3* 4.0*  ALBUMIN 2.5* 1.9* 1.8* 2.1* 1.5*   Recent Labs  Lab 08/22/22 1105  LIPASE <10*   Recent Labs    09/25/21 1034  PROBNP 817*   No results for input(s): "HGBA1C" in the last 72 hours. Culture/Microbiology    Component Value Date/Time   SDES URINE, CLEAN CATCH 07/25/2022 0330   SPECREQUEST NONE 07/25/2022 0330   CULT  07/25/2022 0330    NO GROWTH Performed at [facility] [address], [place], [place] [address]  REPTSTATUS 07/27/2022 FINAL 07/25/2022 0330  Radiology Studies: No results found.   LOS: 6 days   Antonieta Pert, MD [facility]  08/29/2022, 10:53 AM

## 2022-08-29 NOTE — Progress Notes (Signed)
Calorie Count Note  48 hour calorie count ordered. Day 2 results below.  Diet: regular Supplements:  -Boost Breeze po TID, each supplement provides 250 kcal and 9 grams of protein   1/25: Breakfast: 1 bite of french toast Lunch: 25% of meal ~70 kcals, 4g protein Dinner: not documented Supplements: documented as given but not consumed  Total intake: ~75 kcal (4% of minimum estimated needs)  4g protein (4% of minimum estimated needs)  **Today noted that pt did not order any breakfast and for lunch she only ordered a piece of toast, chicken broth and ginger ale. Pt likely to not meet needs PO. Noted Palliative note, plan is for SNF with palliative following, PEG being considered in the future.   Nutrition Dx: Severe Malnutrition related to chronic illness as evidenced by moderate fat depletion, moderate muscle depletion, energy intake < or equal to 75% for > or equal to 1 month.   Goal: Pt to meet >/= 90% of their estimated nutrition needs   Intervention:  -D/c Calorie count -Continue Boost Breeze TID -in cup with lid -Continue to encourage meals and supplements -Multivitamin with minerals daily   Clayton Bibles, MS, RD, LDN Inpatient Clinical Dietitian Contact information available via Amion

## 2022-08-29 NOTE — TOC Progression Note (Addendum)
Transition of Care [facility]) - Progression Note    Patient Details  Name: Donna Bailey MRN: [id number] Date of Birth: 1945-09-05  [facility] CM/SW Contact  Servando Snare, [place] Phone Number: 08/29/2022, 12:18 PM  Clinical Narrative:   TOC spoke to her this morning. TOC discussed bed offers with patient again. Patient wants [facility]. TOC explained that [facility] did not make a bed offer and she would have to to chose from the options given. She is still apprehensive, but can't go home. She needs to give a facility choice. Patient is from home with spouse who she reports to have health issues as well. Patient reports not help at home. She keeps talking about wanting to die at home. Hard to redirect. Palliative recommended SNF.     Expected Discharge Plan: [place] Barriers to Discharge: Continued Medical Work up  Expected Discharge Plan and Services In-house Referral: Clinical Social Work Discharge Planning Services: CM Consult Post Acute Care Choice: [place] Living arrangements for the past 2 months: Single Family Home                 DME Arranged: N/A DME Agency: NA                   Social Determinants of Health (SDOH) Interventions SDOH Screenings   Depression (PHQ2-9): Low Risk  (03/15/2020)  Tobacco Use: Low Risk  (08/24/2022)    Readmission Risk Interventions    11/27/2021   11:39 AM  Readmission Risk Prevention Plan  Transportation Screening Complete  PCP or Specialist Appt within 5-7 Days Complete  Home Care Screening Complete  Medication Review (RN CM) Complete

## 2022-08-30 DIAGNOSIS — R627 Adult failure to thrive: Secondary | ICD-10-CM | POA: Diagnosis not present

## 2022-08-30 LAB — MISC LABCORP TEST (SEND OUT): Labcorp test code: 81950

## 2022-08-30 MED ORDER — ONDANSETRON 4 MG PO TBDP
4.0000 mg | ORAL_TABLET | Freq: Three times a day (TID) | ORAL | Status: DC | PRN
  Administered 2022-08-30 – 2022-09-01 (×3): 4 mg via ORAL
  Filled 2022-08-30 (×3): qty 1

## 2022-08-30 MED ORDER — FUROSEMIDE 40 MG PO TABS
40.0000 mg | ORAL_TABLET | Freq: Every day | ORAL | Status: DC
  Administered 2022-08-31 – 2022-09-01 (×2): 40 mg via ORAL
  Filled 2022-08-30 (×2): qty 1

## 2022-08-30 MED ORDER — SALINE SPRAY 0.65 % NA SOLN
1.0000 | NASAL | Status: DC | PRN
  Filled 2022-08-30: qty 44

## 2022-08-30 MED ORDER — FUROSEMIDE 10 MG/ML IJ SOLN
20.0000 mg | Freq: Once | INTRAMUSCULAR | Status: AC
  Administered 2022-08-30: 20 mg via INTRAVENOUS
  Filled 2022-08-30: qty 2

## 2022-08-30 NOTE — Plan of Care (Signed)

## 2022-08-30 NOTE — Progress Notes (Signed)
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 In ED:was found to be hypoglycemic as well as hypokalemia, hypomagnesemia  w/ acidosis with concerns for starvation ketosis with an elevated beta hydroxybutyrate.  Patient was admitted for further management of electrolyte imbalance starvation ketosis and failure to thrive S/p intravenous volume resuscitation and electrolyte replacement, GI has been consulted> underwent EGD> and CT abdomen pelvis without new abnormality and no signs of GI problem to explain-DVT and GI signed off. Due to concern for psychiatric cause of patient's aversion to food psychiatry has been consulted.  Concern for psych cause, possible PTSD surrounding prior MI that occurred when she tried to eat and fixation with death. 09/20/2022: Palliative meeting held extensively> goals of care addressed and changed to DNR, no feeding tube, plan for skilled nursing facility.  She wants to proceed with outpatient follow-up with her back surgery if not a candidate for back surgery then she will consider hospice route and comfort measures if able to complete some rehab at a SNF and if considered a surgical candidate then at that time she will consider PEG tube placement in the future. Remains weak and deconditioned and awaiting for skilled nursing facility placement Awaiting for skilled nursing facility with palliative care follow-up  Subjective: Seen and examined this morning  complains of some leg swelling Drinking fluid No other new complaints Overnight patient has been afebrile BP stable fairly  Assessment and Plan: Principal Problem:   Failure to thrive in adult Active Problems:   Ketosis ([place])   Protein-calorie malnutrition, severe ([place])   Hypokalemia due to inadequate potassium intake   Hypomagnesemia   Coronary artery disease involving native coronary artery of native heart without angina pectoris   Chronic diastolic CHF (congestive heart failure) (HCC)   Normocytic anemia   Dysphagia   Generalized weakness   Loss of weight   Early satiety   Fundic gland polyps of stomach, benign   Adult failure to thrive Starvation ketoacidosis Inadequate oral intake Aversion to food: Patient underwent extensive workup with GI evaluation, EGD that is unchanged from EGD from 2022, CT of the abdomen and pelvis with IV and oral contrast unrevealing for any cause of patient's presentation.  Wondering if psychiatric issues is contributing to her symptoms- psychiatry has been consulted> no immediate need for inpatient psych, psych has signed off advised to increase and titrate Remeron nightly, sleeping aid, currently no signs of withdrawal, has been on Medication chronically and resumed. Sep 20, 2022: Palliative meeting held extensively> goals of care addressed and changed to DNR, no feeding tube, plan for skilled nursing facility.  She wants to proceed with outpatient follow-up with her back surgery if not a candidate for back surgery then she will consider hospice route and comfort measures if able to complete some rehab at a SNF and if considered a surgical candidate then at that time she will consider PEG tube placement in the future.  Continue to encourage oral intake, oral supplement, PPI Pepto-Bismol Lidoderm patch nutritional support .  Chronic Neck pain/Back pain:Known DJD,continue pain management encourage  ambulation.Reports she is scheduled to see a new back surgeon and  she wants to proceed outpatient continue.She has multiple medication allergies including Tylenol.  Continue lidocaine patch.  Hypokalemia-resolved Hypomagnesemia:resolved Recent Labs  Lab 08/24/22 1045 08/25/22 0731 08/26/22 0610 08/27/22 0610  K 3.2* 3.3* 3.8 3.4*  CALCIUM 8.4* 7.8* 7.8* 7.8*  MG 2.0 1.6*  --   --      CAD: stable, cont spirin 81 Chronic diastolic CHF with mild acute exacerbation with leg edema:.BNP elevated in 800s> chest x-ray 1/22 clear, and somewhat hypervolemic on Lasix at home will give IV Lasix x 1 and resume p.o. from tomorrow.Monitor fluid status/daily weight. Net IO Since Admission: 2,750.44 mL [08/30/22 0855]  Filed Weights   08/26/22 0500 08/27/22 0500 08/28/22 0616  Weight: 65 kg 62.6 kg 64.3 kg    Normocytic anemia: Iron B12 ferritin elevated.Hemoglobin stable, monitor Recent Labs  Lab 08/24/22 1045 08/25/22 0731 08/26/22 0610 08/27/22 0610  HGB 13.7 11.1* 10.9* 11.1*  HCT 40.3 32.7* 30.9* 32.0*    Severe protein calorie malnutrition: dietitian input appreciated.  Wondering if she has disordered eating pattern refusing to eat certain food item such as corn products, analyzing food labels counting bites of meal trays and then spitting out because of fat content.  Continue to augment nutritional status as below  Nutrition Problem: Severe Malnutrition Etiology: chronic illness Signs/Symptoms: moderate fat depletion, moderate muscle depletion, energy intake < or equal to 75% for > or equal to 1 month Interventions: Boost Breeze, Magic cup, MVI, Education  DVT prophylaxis: heparin injection 5,000 Units Start: 08/22/22 2200 Code Status:   Code Status: DNR Family Communication: plan of care discussed with patient at bedside. Patient status is: Inpatient because of ongoing evaluation for FTT Level of care: Med-Surg   Dispo: The patient is from: home            Anticipated disposition: Awaiting skilled nurse facility at this time.  Overall  medically stable Objective: Vitals last 24 hrs: Vitals:   08/29/22 1247 08/29/22 1942 08/30/22 0452 08/30/22 0500  BP: (!) 102/55 97/65  120/60  Pulse: 96 99 89   Resp: '18 16 16   '$ Temp: 98.2 F (36.8 C) 97.9 F (36.6 C) 97.6 F (36.4 C)   TempSrc: Oral Oral Oral   SpO2: 97% 100% 97%   Weight:      Height:       Weight change:   Physical Examination: General exam: AA oriented, weak,older appearing HEENT:Oral mucosa moist, Ear/Nose WNL grossly, dentition normal. Respiratory system: bilaterally clear BS, no use of accessory muscle Cardiovascular system: S1 & S2 +, regular rate, JVD neg. Gastrointestinal system: Abdomen soft, NT,ND,BS+ Nervous System:Alert, awake, moving extremities and grossly nonfocal Extremities: LE ankle edema mild,lower extremities warm Skin: No rashes,no icterus. MSK: Normal muscle bulk,tone, power    Medications reviewed:  Scheduled Meds:  amitriptyline  25 mg Oral QHS   aspirin  81 mg Oral Daily   cyanocobalamin  1,000 mcg Intramuscular Q Sun   feeding supplement  1 Container Oral TID BM   folic acid  1 mg Oral Daily   heparin  5,000 Units Subcutaneous Q8H   lidocaine  2 patch Transdermal Q24H   mirtazapine  7.5 mg Oral QHS   multivitamin  15 mL Oral Daily   pantoprazole  40 mg Oral Daily  Continuous Infusions: Unresulted Labs (From admission, onward)     Start     Ordered   08/25/22 1404  Rapid urine drug screen (hospital  performed)  Add-on,   AD        08/25/22 1403          Data Reviewed: I have personally reviewed following labs and imaging studies CBC: Recent Labs  Lab 08/24/22 1045 08/25/22 0731 08/26/22 0610 08/27/22 0610  WBC 6.8 5.2 6.0 5.7  NEUTROABS 2.2 1.7  --   --   HGB 13.7 11.1* 10.9* 11.1*  HCT 40.3 32.7* 30.9* 32.0*  MCV 95.7 96.7 95.1 96.4  PLT 339 260 270 161   Basic Metabolic Panel: Recent Labs  Lab 08/24/22 1045 08/25/22 0731 08/26/22 0610 08/27/22 0610  NA 138 137 136 137  K 3.2* 3.3* 3.8 3.4*  CL  101 104 104 106  CO2 '27 26 26 24  '$ GLUCOSE 96 151* 93 82  BUN <5* <5* <5* <5*  CREATININE 0.84 0.90 0.92 1.05*  CALCIUM 8.4* 7.8* 7.8* 7.8*  MG 2.0 1.6*  --   --     GFR: Estimated Creatinine Clearance: 41.7 mL/min (A) (by C-G formula based on SCr of 1.05 mg/dL (H)). Liver Function Tests: Recent Labs  Lab 08/24/22 1045 08/25/22 0731  AST 37 41  ALT 14 15  ALKPHOS 73 54  BILITOT 0.5 0.5  PROT 5.3* 4.0*  ALBUMIN 2.1* 1.5*    No results for input(s): "LIPASE", "AMYLASE" in the last 168 hours.  Recent Labs    09/25/21 1034  PROBNP 817*    No results for input(s): "HGBA1C" in the last 72 hours. Culture/Microbiology    Component Value Date/Time   SDES URINE, CLEAN CATCH 07/25/2022 0330   SPECREQUEST NONE 07/25/2022 0330   CULT  07/25/2022 0330    NO GROWTH Performed at [facility] [address], [place], [place] [address]    REPTSTATUS 07/27/2022 FINAL 07/25/2022 0330  Radiology Studies: No results found.   LOS: 7 days   Antonieta Pert, MD [facility]  08/30/2022, 8:55 AM

## 2022-08-30 NOTE — TOC Progression Note (Signed)
Transition of Care [facility] - Progression Note    Patient Details  Name: Donna Bailey MRN: [id number] Date of Birth: [birth year]  [facility]) CM/SW Contact  Rodney Booze, [place] Phone Number: 08/30/2022, 11:00 AM  Clinical Narrative:    CSW went to discuss with the patient the bed offers and has she made a decision. The patient has made a decision. The patient choose [facility] CSW reached out to the facility to make sure it was okay to start the [place]. The facility has officially accepted the patient and this CSW has started the AUTH [facility] will continue to follow pending DC.   Expected Discharge Plan: [place] Barriers to Discharge: Continued Medical Work up  Expected Discharge Plan and Services In-house Referral: Clinical Social Work Discharge Planning Services: CM Consult Post Acute Care Choice: [place] Living arrangements for the past 2 months: Single Family Home                 DME Arranged: N/A DME Agency: NA                   Social Determinants of Health (SDOH) Interventions SDOH Screenings   Depression (PHQ2-9): Low Risk  (03/15/2020)  Tobacco Use: Low Risk  (08/24/2022)    Readmission Risk Interventions    11/27/2021   11:39 AM  Readmission Risk Prevention Plan  Transportation Screening Complete  PCP or Specialist Appt within 5-7 Days Complete  Home Care Screening Complete  Medication Review (RN CM) Complete

## 2022-08-31 DIAGNOSIS — R627 Adult failure to thrive: Secondary | ICD-10-CM | POA: Diagnosis not present

## 2022-08-31 MED ORDER — PANTOPRAZOLE SODIUM 40 MG PO TBEC
40.0000 mg | DELAYED_RELEASE_TABLET | Freq: Every day | ORAL | Status: DC
  Administered 2022-09-01: 40 mg via ORAL
  Filled 2022-08-31: qty 1

## 2022-08-31 MED ORDER — LACTATED RINGERS IV SOLN: INTRAVENOUS | Status: DC

## 2022-08-31 MED ORDER — POTASSIUM CHLORIDE CRYS ER 10 MEQ PO TBCR
10.0000 meq | EXTENDED_RELEASE_TABLET | Freq: Every day | ORAL | Status: DC
  Administered 2022-08-31 – 2022-09-01 (×2): 10 meq via ORAL
  Filled 2022-08-31 (×2): qty 1

## 2022-08-31 MED ORDER — CARBAMIDE PEROXIDE 6.5 % OT SOLN
5.0000 [drp] | Freq: Two times a day (BID) | OTIC | Status: DC
  Administered 2022-08-31 – 2022-09-01 (×3): 5 [drp] via OTIC
  Filled 2022-08-31: qty 15

## 2022-08-31 NOTE — Progress Notes (Signed)
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 In ED:was found to be hypoglycemic as well as hypokalemia, hypomagnesemia  w/ acidosis with concerns for starvation ketosis with an elevated beta hydroxybutyrate.  Patient was admitted for further management of electrolyte imbalance starvation ketosis and failure to thrive S/p intravenous volume resuscitation and electrolyte replacement, GI has been consulted> underwent EGD> and CT abdomen pelvis without new abnormality and no signs of GI problem to explain-DVT and GI signed off. Due to concern for psychiatric cause of patient's aversion to food psychiatry has been consulted.  Concern for psych cause, possible PTSD surrounding prior MI that occurred when she tried to eat and fixation with death. 09-02-2022: Palliative meeting held extensively> goals of care addressed and changed to DNR, no feeding tube, plan for skilled nursing facility.  She wants to proceed with outpatient follow-up with her back surgery if not a candidate for back surgery then she will consider hospice route and comfort measures if able to complete some rehab at a SNF and if considered a surgical candidate then at that time she will consider PEG tube placement in the future. Remains weak and deconditioned and awaiting for skilled nursing facility placement Awaiting for skilled nursing facility with palliative care follow-up  Subjective:  Seen this am little upset has  not gotten her protonix. Refused labs this am stating:what's the point as I am palliative Overnight afebrile, BP stable Leg edema some better  Assessment and Plan: Principal Problem:   Failure to thrive in adult Active Problems:   Ketosis (HCC)   Protein-calorie malnutrition, severe (HCC)   Hypokalemia due to inadequate potassium intake   Hypomagnesemia   Coronary artery disease involving native coronary artery of native heart without angina pectoris   Chronic diastolic CHF (congestive heart failure) (HCC)   Normocytic anemia   Dysphagia   Generalized weakness   Loss of weight   Early satiety   Fundic gland polyps of stomach, benign   Adult failure to thrive Starvation ketoacidosis Inadequate oral intake Aversion to food: Patient underwent extensive workup with GI evaluation, EGD that is unchanged from EGD from 2022, CT of the abdomen and pelvis with IV and oral contrast unrevealing for any cause of patient's presentation.  Wondering if psychiatric issues is contributing to her symptoms- psychiatry has been consulted> no immediate need for inpatient psych, psych has signed off advised to increase and titrate Remeron nightly, sleeping aid, currently no signs of withdrawal, has been on Medication chronically and resumed. September 02, 2022: Palliative meeting held extensively> goals of care addressed and changed to DNR, no feeding tube, plan for skilled nursing facility.  She wants to proceed with outpatient follow-up with her back surgery if not a candidate for back surgery then she will consider hospice route and comfort measures if able to complete some rehab at a SNF and if considered a surgical candidate then at that time she will consider PEG tube placement in the future.  Continue to encourage oral intake, oral supplement, PPI Pepto-Bismol Lidoderm patch nutritional support .  Chronic Neck  pain/Back pain:Known DJD,continue pain management encourage ambulation.Reports she is scheduled to see a new  spine surgeon Dr Liberty Handy wants to proceed outpatient continue.She has multiple medication allergies including Tylenol.  Continue lidocaine patch.  Hypokalemia-resolved Hypomagnesemia:resolved Recent Labs  Lab 08/24/22 1045 08/25/22 0731 08/26/22 0610 08/27/22 0610  K 3.2* 3.3* 3.8 3.4*  CALCIUM 8.4* 7.8* 7.8* 7.8*  MG 2.0 1.6*  --   --     CAD: stable, cont aspirin 81 Chronic diastolic CHF with mild acute exacerbation with leg edema:.BNP elevated in 800s> chest x-ray 1/22 clear, and somewhat hypervolemic on Lasix at home s/p IV Lasix x 1> now on PO lasix. Leg edema improving. Cont  to monitor fluid status/daily weight. Net IO Since Admission: 2,870.44 mL [08/31/22 0956]  Filed Weights   08/26/22 0500 08/27/22 0500 08/28/22 0616  Weight: 65 kg 62.6 kg 64.3 kg    Normocytic anemia: Iron B12 ferritin elevated.Hemoglobin stable, monitor Recent Labs  Lab 08/24/22 1045 08/25/22 0731 08/26/22 0610 08/27/22 0610  HGB 13.7 11.1* 10.9* 11.1*  HCT 40.3 32.7* 30.9* 32.0*   Severe protein calorie malnutrition: dietitian input appreciated.  Wondering if she has disordered eating pattern refusing to eat certain food item such as corn products, analyzing food labels counting bites of meal trays and then spitting out because of fat content.  Continue to augment nutritional status as below. She refused labs to be comfortable- cancelled routine labs, cont palliative are and she will follow up with hospice.  Nutrition Problem: Severe Malnutrition Etiology: chronic illness Signs/Symptoms: moderate fat depletion, moderate muscle depletion, energy intake < or equal to 75% for > or equal to 1 month Interventions: Boost Breeze, Magic cup, MVI, Education  DVT prophylaxis: heparin injection 5,000 Units Start: 08/22/22 2200 Code Status:   Code Status: DNR Family Communication: plan of care discussed with patient at bedside. Patient status is: Inpatient because of ongoing evaluation for FTT Level of  care: Med-Surg   Dispo: The patient is from: home            Anticipated disposition: Awaiting skilled nurse facility at this time.  Overall medically stable Objective: Vitals last 24 hrs: Vitals:   08/30/22 1356 08/30/22 2025 08/31/22 0000 08/31/22 0605  BP: 120/65 125/72  (!) 123/57  Pulse: (!) 105 93  95  Resp: '18 18  18  '$ Temp: 98.4 F (36.9 C) 97.7 F (36.5 C) 98 F (36.7 C) (!) 97.4 F (36.3 C)  TempSrc: Oral Oral Oral Oral  SpO2: 98% 98%  92%  Weight:      Height:       Weight change:   Physical Examination: General exam: AAox3, ill looking, frail. HEENT:Oral mucosa moist, Ear/Nose WNL grossly, dentition normal. Respiratory system: bilaterally clear BS, no use of accessory muscle Cardiovascular system: S1 & S2 +, regular rate. Gastrointestinal system: Abdomen soft, NT,ND,BS+ Nervous System:Alert, awake, moving extremities and grossly nonfocal Extremities: LE ankle edema mild, lower extremities warm Skin: No rashes,no icterus. MSK: Normal muscle bulk,tone, power    Medications reviewed:  Scheduled Meds:  amitriptyline  25 mg Oral QHS   aspirin  81 mg Oral Daily   cyanocobalamin  1,000 mcg Intramuscular Q Sun   feeding supplement  1 Container Oral TID BM   folic acid  1 mg Oral Daily   furosemide  40 mg Oral Daily   heparin  5,000 Units Subcutaneous Q8H   lidocaine  2 patch Transdermal Q24H   mirtazapine  7.5 mg Oral QHS   multivitamin  15 mL Oral Daily   pantoprazole  40 mg Oral Daily  Continuous Infusions: Unresulted Labs (From admission, onward)     Start     Ordered   08/25/22 1404  Rapid urine drug screen (hospital performed)  Add-on,   AD        08/25/22 1403          Data Reviewed: I have personally reviewed following labs and imaging studies CBC: Recent Labs  Lab 08/24/22 1045 08/25/22 0731 08/26/22 0610 08/27/22 0610  WBC 6.8 5.2 6.0 5.7  NEUTROABS 2.2 1.7  --   --   HGB 13.7 11.1* 10.9* 11.1*  HCT 40.3 32.7* 30.9* 32.0*  MCV 95.7  96.7 95.1 96.4  PLT 339 260 270 932  Basic Metabolic Panel: Recent Labs  Lab 08/24/22 1045 08/25/22 0731 08/26/22 0610 08/27/22 0610  NA 138 137 136 137  K 3.2* 3.3* 3.8 3.4*  CL 101 104 104 106  CO2 '27 26 26 24  '$ GLUCOSE 96 151* 93 82  BUN <5* <5* <5* <5*  CREATININE 0.84 0.90 0.92 1.05*  CALCIUM 8.4* 7.8* 7.8* 7.8*  MG 2.0 1.6*  --   --    GFR: Estimated Creatinine Clearance: 41.7 mL/min (A) (by C-G formula based on SCr of 1.05 mg/dL (H)). Liver Function Tests: Recent Labs  Lab 08/24/22 1045 08/25/22 0731  AST 37 41  ALT 14 15  ALKPHOS 73 54  BILITOT 0.5 0.5  PROT 5.3* 4.0*  ALBUMIN 2.1* 1.5*   No results for input(s): "LIPASE", "AMYLASE" in the last 168 hours.  Recent Labs    09/25/21 1034  PROBNP 817*   No results for input(s): "HGBA1C" in the last 72 hours. Culture/Microbiology    Component Value Date/Time   SDES URINE, CLEAN CATCH 07/25/2022 0330   SPECREQUEST NONE 07/25/2022 0330   CULT  07/25/2022 0330    NO GROWTH Performed at [facility] [address] [place], [place] [address]    REPTSTATUS 07/27/2022 FINAL 07/25/2022 0330  Radiology Studies: No results found.   LOS: 8 days   Antonieta Pert, MD [facility]  08/31/2022, 9:56 AM

## 2022-08-31 NOTE — Progress Notes (Signed)
Phlebotomist advised me that patient was refusing lab draw. When I went to educate patient on the importance of obtaining blood work, patient stated "I am going to die soon, so what's the point of checking my labs" Antonieta Pert, MD aware. Dr. Lupita Leash has cancelled lab work.

## 2022-08-31 NOTE — TOC Progression Note (Signed)
Transition of Care [facility]) - Progression Note    Patient Details  Name: RAYME BUI MRN: [id number] Date of Birth: 05-14-1946  Transition of Care [facility]) CM/SW Contact  Rodney Booze, [place] Phone Number: 08/31/2022, 12:43 PM  Clinical Narrative:    At this time AUTH Is still pending. TOC will update as they come.    Expected Discharge Plan: [place] Barriers to Discharge: Continued Medical Work up  Expected Discharge Plan and Services In-house Referral: Clinical Social Work Discharge Planning Services: CM Consult Post Acute Care Choice: [place] Living arrangements for the past 2 months: Single Family Home                 DME Arranged: N/A DME Agency: NA                   Social Determinants of Health (SDOH) Interventions SDOH Screenings   Depression (PHQ2-9): Low Risk  (03/15/2020)  Tobacco Use: Low Risk  (08/24/2022)    Readmission Risk Interventions    11/27/2021   11:39 AM  Readmission Risk Prevention Plan  Transportation Screening Complete  PCP or Specialist Appt within 5-7 Days Complete  Home Care Screening Complete  Medication Review (RN CM) Complete

## 2022-08-31 NOTE — Plan of Care (Signed)

## 2022-09-01 ENCOUNTER — Encounter: Payer: Medicare Other | Admitting: Internal Medicine

## 2022-09-01 DIAGNOSIS — R627 Adult failure to thrive: Secondary | ICD-10-CM | POA: Diagnosis not present

## 2022-09-01 MED ORDER — GUAIFENESIN-CODEINE 100-10 MG/5ML PO SOLN: 2.5000 mL | Freq: Two times a day (BID) | ORAL | 0 refills | Status: AC | PRN

## 2022-09-01 MED ORDER — METOCLOPRAMIDE HCL 5 MG/ML IJ SOLN: 5.0000 mg | Freq: Every day | INTRAMUSCULAR | Status: DC | PRN

## 2022-09-01 MED ORDER — CARBAMIDE PEROXIDE 6.5 % OT SOLN: 5.0000 [drp] | Freq: Two times a day (BID) | OTIC | 0 refills | Status: AC

## 2022-09-01 MED ORDER — SALINE SPRAY 0.65 % NA SOLN: 1.0000 | Freq: Two times a day (BID) | NASAL | 0 refills | Status: DC

## 2022-09-01 MED ORDER — ONDANSETRON 4 MG PO TBDP: 4.0000 mg | ORAL_TABLET | Freq: Four times a day (QID) | ORAL | 1 refills | Status: DC | PRN

## 2022-09-01 MED ORDER — FUROSEMIDE 20 MG PO TABS: 20.0000 mg | ORAL_TABLET | Freq: Every day | ORAL | Status: DC

## 2022-09-01 MED ORDER — LIDOCAINE 5 % EX PTCH: 0.5000 | MEDICATED_PATCH | Freq: Every day | CUTANEOUS | 0 refills | Status: AC

## 2022-09-01 NOTE — TOC Progression Note (Signed)
Transition of Care [facility]) - Progression Note    Patient Details  Name: Donna Bailey MRN: [id number] Date of Birth: 28-Oct-1945  Transition of Care [facility]) CM/SW [place], LCSW Phone Number: 09/01/2022, 3:39 PM  Clinical Narrative:    CSW received call from [place] with [facility] who shares that since pt has gotten to their facility she has been upset. She has demanded that all staff know all of her allergies. She has refused people coming into her room who she suspects are wearing polyester. All bedding was removed prior to pt coming to their facility as [facility] was made aware of polyester allergy. Pt then demanded a new mattress and bed. Per [facility] she has very high expectations and may decline to stay at their facility.    Expected Discharge Plan: [place] Barriers to Discharge: Barriers Resolved  Expected Discharge Plan and Services In-house Referral: Clinical Social Work Discharge Planning Services: CM Consult Post Acute Care Choice: [place] Living arrangements for the past 2 months: [place] Expected Discharge Date: 09/01/22               DME Arranged: N/A DME Agency: NA                   Social Determinants of Health (SDOH) Interventions SDOH Screenings   Depression (PHQ2-9): Low Risk  (03/15/2020)  Tobacco Use: Low Risk  (08/24/2022)    Readmission Risk Interventions    09/01/2022   12:16 PM 11/27/2021   11:39 AM  Readmission Risk Prevention Plan  Transportation Screening Complete Complete  PCP or Specialist Appt within 5-7 Days Complete Complete  Home Care Screening Complete Complete  Medication Review (RN CM) Complete Complete

## 2022-09-01 NOTE — Discharge Summary (Signed)
Physician Discharge Summary  Donna Bailey:[id number] DOB: 1946/04/28 DOA: 08/22/2022  PCP: [name], MD  Admit date: 08/22/2022 Discharge date: 09/01/2022 Recommendations for Outpatient Follow-up:  Follow up with PCP in 1 weeks-call for appointment Please f/u with palliative medicine  Discharge Dispo:SNF Discharge Condition: Stable Code Status:   Code Status: DNR Diet recommendation:  Diet Order             Diet regular Room service appropriate? 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 In ED:was found to be hypoglycemic as well as hypokalemia, hypomagnesemia  w/ acidosis with concerns for starvation ketosis with an elevated beta hydroxybutyrate.  Patient was admitted for further management of electrolyte imbalance starvation ketosis and failure to thrive S/p intravenous volume resuscitation and electrolyte replacement, GI has been consulted> underwent EGD> and CT abdomen pelvis without new abnormality and no signs of GI problem to explain-DVT and GI signed off. Due to concern for psychiatric cause of patient's aversion to food psychiatry has been consulted.  Concern for psych cause, possible PTSD surrounding prior MI that occurred when she tried to eat and fixation with death. Sep 20, 2022: Palliative meeting held extensively> goals of care addressed and changed to DNR, no feeding tube, plan for skilled nursing facility.  She wants to proceed with outpatient follow-up with her back surgery if not a  candidate for back surgery then she will consider hospice route and comfort measures if able to complete some rehab at a SNF and if considered a surgical candidate then at that time she will consider PEG tube placement in the future. Remains weak and deconditioned and awaiting for skilled nursing facility placement    Discharge Diagnoses:  Principal Problem:   Failure to thrive in adult Active Problems:   Ketosis ([place])   Protein-calorie malnutrition, severe ([place])   Hypokalemia due to inadequate potassium intake   Hypomagnesemia   Coronary artery disease involving native coronary artery of native heart without angina pectoris   Chronic diastolic CHF (congestive heart failure) ([facility])   Normocytic anemia   Dysphagia   Generalized weakness   Loss of weight   Early satiety   Fundic gland polyps of stomach, benign  Adult failure to thrive Starvation ketoacidosis Inadequate oral intake Aversion to food: Patient underwent extensive workup with GI evaluation, EGD that is unchanged from EGD from 2022, CT of the abdomen and pelvis with IV and oral contrast unrevealing for any cause of patient's presentation.  Wondering if psychiatric issues is contributing to her symptoms- psychiatry has been consulted> no immediate need for inpatient psych, psych has signed off advised to increase and titrate Remeron nightly, sleeping aid, currently no signs of withdrawal, has been on Medication chronically and resumed. 09-20-2022: Palliative meeting held extensively> goals of care addressed and changed to DNR, no feeding tube, plan for skilled nursing facility.  She wants to proceed with outpatient follow-up with her back surgery if not a candidate for back surgery then she will consider hospice route and comfort  measures if able to complete some rehab at a SNF and if considered a surgical candidate then at that time she will consider PEG tube placement in the future.  Continue to encourage oral intake, oral supplement,  PPI Pepto-Bismol Lidoderm patch nutritional support .  Chronic Neck pain/Back pain:Known DJD,continue pain management encourage ambulation.Reports she is scheduled to see a new spine surgeon Dr Liberty Handy wants to proceed outpatient continue.She has multiple medication allergies including Tylenol.  Continue lidocaine patch.  Hypokalemia-resolved Hypomagnesemia:resolved Recent Labs  Lab 08/26/22 0610 08/27/22 0610  K 3.8 3.4*  CALCIUM 7.8* 7.8*    CAD: stable, cont aspirin 81 Chronic diastolic CHF with mild acute exacerbation with leg edema:.BNP elevated in 800s> chest x-ray 1/22 clear, and somewhat hypervolemic on Lasix at home s/p IV Lasix x 1> now on PO lasix. Leg edema improving. Cont  to monitor fluid status/daily weight. Net IO Since Admission: 2,635.5 mL [09/01/22 0934]  Filed Weights   08/27/22 0500 08/28/22 0616 09/01/22 0600  Weight: 62.6 kg 64.3 kg 61.7 kg    Normocytic anemia: Iron B12 ferritin elevated.Hemoglobin stable, monitor Recent Labs  Lab 08/26/22 0610 08/27/22 0610  HGB 10.9* 11.1*  HCT 30.9* 32.0*   Severe protein calorie malnutrition: dietitian input appreciated.  Wondering if she has disordered eating pattern refusing to eat certain food item such as corn products, analyzing food labels counting bites of meal trays and then spitting out because of fat content.  Continue to augment nutritional status as below. She refused labs to be comfortable- cancelled routine labs, cont palliative are and she will follow up with hospice.  Nutrition Problem: Severe Malnutrition Etiology: chronic illness Signs/Symptoms: moderate fat depletion, moderate muscle depletion, energy intake < or equal to 75% for > or equal to 1 month Interventions: Boost Breeze, Magic cup, MVI, Education   Consults: GI PALLIATIVE MEDICINE  Subjective: Aaox3, no vomiting. No new complaints   Discharge Exam: Vitals:   08/31/22 2002 09/01/22 0600  BP: 117/75 126/64  Pulse: 95 86  Resp: 19  18  Temp: 98.9 F (37.2 C) (!) 97.4 F (36.3 C)  SpO2: 94%    General: Pt is alert, awake, not in acute distress Cardiovascular: RRR, S1/S2 +, no rubs, no gallops Respiratory: CTA bilaterally, no wheezing, no rhonchi Abdominal: Soft, NT, ND, bowel sounds + Extremities: no edema, no cyanosis  Discharge Instructions  Discharge Instructions     Discharge instructions   Complete by: As directed    Please call call MD or return to ER for similar or worsening recurring problem that brought you to hospital or if any fever,nausea/vomiting,abdominal pain, uncontrolled pain, chest pain,  shortness of breath or any other alarming symptoms.  Please follow-up your doctor as instructed in a week time and call the office for appointment.  Follow up with palliative care at rehab  Please avoid alcohol, smoking, or any other illicit substance and maintain healthy habits including taking your regular medications as prescribed.  You were cared for by a hospitalist during your hospital stay. If you have any questions about your discharge medications or the care you received while you were in the hospital after you are discharged, you can call the unit and ask to speak with the hospitalist on call if the hospitalist that took care of you is not available.  Once you are discharged, your primary care physician will handle any further medical issues. Please note that NO REFILLS for any discharge medications will be authorized once you are discharged, as it  is imperative that you return to your primary care physician (or establish a relationship with a primary care physician if you do not have one) for your aftercare needs so that they can reassess your need for medications and monitor your lab values   Increase activity slowly   Complete by: As directed       Allergies as of 09/01/2022       Reactions   Cephalosporins Itching, Other (See Comments)   Tolerated cefdinir in 2019   Crestor [rosuvastatin]  Other (See Comments)   Lost all muscle mobility    Doxycycline Diarrhea, Nausea And Vomiting   Formoterol Other (See Comments)   Reaction not recalled   Other Anaphylaxis, Itching, Rash, Other (See Comments)   Polyester-"pins sticking in her skin   Sulfa Antibiotics Anaphylaxis   Sulfonamide Derivatives Anaphylaxis   Ciprofloxacin Other (See Comments), Rash   Nitrofurantoin Diarrhea, Nausea And Vomiting, Other (See Comments)   Also, "convulsions/constant shaking"- per patient   Penicillins Rash   Underarms (both) Has patient had a PCN reaction causing immediate rash, facial/tongue/throat swelling, SOB or lightheadedness with hypotension: YES Has patient had a PCN reaction causing severe rash involving mucus membranes or skin necrosis: NO Has patient had a PCN reaction that required hospitalization NO Has patient had a PCN reaction occurring within the last 10 years: NO If all of the above answers are "NO", then may proceed with Cephalosporin use. Other reaction(s): Other (See Comments) Underarms (both) Other Reaction: "red hot skin" Underarms (both) Has patient had a PCN reaction causing immediate rash, facial/tongue/throat swelling, SOB or lightheadedness with hypotension: YES Has patient had a PCN reaction causing severe rash involving mucus membranes or skin necrosis: NO Has patient had a PCN reaction that required hospitalization NO Has patient had a PCN reaction occurring within the last 10 years: NO If all of the above answers are "NO", then may proceed with Cephalosporin use.   Prednisone Itching   Pt states she cannot take prednisone with corn filler 05/19/2019.   Amlodipine Swelling, Other (See Comments)   Swelling in legs   Caffeine Other (See Comments)   Heart races   Carvedilol    dry mouth   Cephalexin Other (See Comments)   Reaction not recalled by the patient   Dilt-xr Deniece Ree Hcl]    LE edema   Esomeprazole Other (See Comments)   Reaction not cited    Formoterol Fumarate Other (See Comments)   Reaction not recalled   Fosfomycin    Nerve pain in her legs   Gold Sodium Thiosulfate Other (See Comments)   Positive patch test   Gold-containing Drug Products Other (See Comments)   Skin tingles   Hydralazine    Levofloxacin In D5w Other (See Comments)   Racing heart   Macrodantin [nitrofurantoin Macrocrystal] Itching   Moxifloxacin Other (See Comments)   Caused hands to "shake"   Neomycin Other (See Comments)   Doesn't remember - allergist said not to use it because it could add more allergies- Positive patch test   Ofloxacin Itching, Other (See Comments)   Praluent [alirocumab] Other (See Comments)   "Shaking"   Pregabalin Other (See Comments)   Ranexa [ranolazine Er]    itching   Repatha [evolocumab]    shaking   Valsartan Other (See Comments)   Pt reports this med causes her to feel sluggish and she feels heaviness on her shoulders.   Welchol [colesevelam] Other (See Comments)   Reaction not cited   Zetia [ezetimibe]  Zolpidem    Can tolerate in 2024   Acetaminophen Other (See Comments), Rash   Facial rash   Fexofenadine Palpitations, Other (See Comments)   Heart races   Ibuprofen Rash   Latex Rash, Other (See Comments)   Skin gets red   Levofloxacin Other (See Comments), Palpitations   HEART RACING   Mold Extract [trichophyton Mentagrophyte] Other (See Comments)   Bumps on back, stops up sinuses.   Molds & Smuts Rash, Other (See Comments)   Bumps on back, stops up sinuses.   Nickel Rash   Tape Rash   Trichophyton Rash, Other (See Comments)   Bumps on back, stops up sinuses        Medication List     STOP taking these medications    amitriptyline 25 MG tablet Commonly known as: ELAVIL   clindamycin 150 MG capsule Commonly known as: CLEOCIN   Hydromet 5-1.5 MG/5ML syrup Generic drug: HYDROcodone bit-homatropine   ondansetron 4 MG tablet Commonly known as: ZOFRAN   PROBIOTIC DAILY PO    promethazine-codeine 6.25-10 MG/5ML syrup Commonly known as: PHENERGAN with CODEINE   zolpidem 10 MG tablet Commonly known as: AMBIEN       TAKE these medications    aspirin 81 MG chewable tablet Chew 81 mg by mouth daily.   bismuth subsalicylate 696 MG chewable tablet Commonly known as: PEPTO BISMOL Chew 262-524 mg by mouth as needed for indigestion or diarrhea or loose stools.   carbamide peroxide 6.5 % OTIC solution Commonly known as: DEBROX Place 5 drops into both ears 2 (two) times daily for 5 days.   carvedilol 3.125 MG tablet Commonly known as: COREG Take 1 tablet (3.125 mg total) by mouth 2 (two) times daily.   cyanocobalamin 1000 MCG/ML injection Commonly known as: VITAMIN B12 Inject 1,000 mcg into the muscle every Sunday.   diphenhydrAMINE 25 mg capsule Commonly known as: BENADRYL Take 25 mg by mouth daily as needed for sleep or itching.   esomeprazole 40 MG capsule Commonly known as: NEXIUM TAKE 1 CAPSULE BY MOUTH TWICE A DAY   famotidine 40 MG tablet Commonly known as: PEPCID Take 40 mg by mouth daily as needed for heartburn or indigestion.   folic acid 1 MG tablet Commonly known as: FOLVITE Take 1 tablet (1 mg total) by mouth daily.   furosemide 20 MG tablet Commonly known as: LASIX Take 1 tablet (20 mg total) by mouth daily. What changed:  medication strength how much to take   Gabapentin 10 % Crea Apply 1-2 application. topically at bedtime. Feet   guaiFENesin-codeine 100-10 MG/5ML syrup Take 2.5 mLs by mouth 2 (two) times daily as needed for up to 5 days (Cold). What changed:  how much to take when to take this   hydrALAZINE 25 MG tablet Commonly known as: APRESOLINE Take 1 tablet (25 mg total) by mouth daily as needed (for BP greater than 789 systolic).   lidocaine 5 % Commonly known as: LIDODERM Place 0.5-1 patches onto the skin daily. Apply 0.5-1 patch to spine and affected shoulders daily FROM 8 AM TO 8 PM DAILY after  bath What changed:  when to take this reasons to take this additional instructions   mirtazapine 7.5 MG tablet Commonly known as: REMERON Take 1 tablet (7.5 mg total) by mouth at bedtime.   multivitamin Liqd Take 15 mLs by mouth daily.   nitroGLYCERIN 0.4 MG SL tablet Commonly known as: NITROSTAT Place 1 tablet (0.4 mg total) under the tongue every 5 (  five) minutes as needed for chest pain.   Nitro-Bid 2 % ointment Generic drug: nitroGLYCERIN Apply 1 g topically See admin instructions. Apply 1 gram daily to the finger tips as directed   NON FORMULARY Take 1 tablet by mouth See admin instructions. MagBlue tablets- Take 1 tablet by mouth once a day   ondansetron 4 MG disintegrating tablet Commonly known as: ZOFRAN-ODT Take 1 tablet (4 mg total) by mouth every 6 (six) hours as needed for nausea or vomiting (PREMEAL GIVE ZOFRAN). What changed: reasons to take this   oxyCODONE 5 MG immediate release tablet Commonly known as: Oxy IR/ROXICODONE Take 1 tablet (5 mg total) by mouth every 6 (six) hours as needed for up to 3 doses for moderate pain or severe pain. What changed: how much to take   potassium chloride SA 20 MEQ tablet Commonly known as: KLOR-CON M Take 1 tablet (20 mEq total) by mouth 2 (two) times daily for 5 days.   sodium chloride 0.65 % Soln nasal spray Commonly known as: OCEAN Place 1 spray into both nostrils 2 (two) times daily.   ticagrelor 90 MG Tabs tablet Commonly known as: BRILINTA Take 1 tablet (90 mg total) by mouth 2 (two) times daily.        Follow-up Information     [name], MD Follow up in 1 week(s).   Specialty: Family Medicine Contact information: [place] [address] [phone]         [name], MD Follow up in 1 week(s).   Specialty: Family Medicine Contact information: [place] [address] [phone]                Allergies  Allergen Reactions    Cephalosporins Itching and Other (See Comments)    Tolerated cefdinir in 2019   Crestor [Rosuvastatin] Other (See Comments)    Lost all muscle mobility    Doxycycline Diarrhea and Nausea And Vomiting   Formoterol Other (See Comments)    Reaction not recalled   Other Anaphylaxis, Itching, Rash and Other (See Comments)    Polyester-"pins sticking in her skin    Sulfa Antibiotics Anaphylaxis   Sulfonamide Derivatives Anaphylaxis   Ciprofloxacin Other (See Comments) and Rash   Nitrofurantoin Diarrhea, Nausea And Vomiting and Other (See Comments)    Also, "convulsions/constant shaking"- per patient   Penicillins Rash    Underarms (both) Has patient had a PCN reaction causing immediate rash, facial/tongue/throat swelling, SOB or lightheadedness with hypotension: YES Has patient had a PCN reaction causing severe rash involving mucus membranes or skin necrosis: NO Has patient had a PCN reaction that required hospitalization NO Has patient had a PCN reaction occurring within the last 10 years: NO If all of the above answers are "NO", then may proceed with Cephalosporin use. Other reaction(s): Other (See Comments) Underarms (both) Other Reaction: "red hot skin" Underarms (both) Has patient had a PCN reaction causing immediate rash, facial/tongue/throat swelling, SOB or lightheadedness with hypotension: YES Has patient had a PCN reaction causing severe rash involving mucus membranes or skin necrosis: NO Has patient had a PCN reaction that required hospitalization NO Has patient had a PCN reaction occurring within the last 10 years: NO If all of the above answers are "NO", then may proceed with Cephalosporin use.   Prednisone Itching    Pt states she cannot take prednisone with corn filler 05/19/2019.   Amlodipine Swelling and Other (See Comments)    Swelling in legs  Caffeine Other (See Comments)    Heart races   Carvedilol     dry mouth   Cephalexin Other (See Comments)    Reaction not  recalled by the patient   Dilt-Xr [Diltiazem Hcl]     LE edema   Esomeprazole Other (See Comments)    Reaction not cited   Formoterol Fumarate Other (See Comments)    Reaction not recalled   Fosfomycin     Nerve pain in her legs   Gold Sodium Thiosulfate Other (See Comments)    Positive patch test   Gold-Containing Drug Products Other (See Comments)    Skin tingles   Hydralazine    Levofloxacin In D5w Other (See Comments)    Racing heart   Macrodantin [Nitrofurantoin Macrocrystal] Itching   Moxifloxacin Other (See Comments)    Caused hands to "shake"   Neomycin Other (See Comments)    Doesn't remember - allergist said not to use it because it could add more allergies- Positive patch test    Ofloxacin Itching and Other (See Comments)   Praluent [Alirocumab] Other (See Comments)    "Shaking"   Pregabalin Other (See Comments)   Ranexa [Ranolazine Er]     itching   Repatha [Evolocumab]     shaking   Valsartan Other (See Comments)    Pt reports this med causes her to feel sluggish and she feels heaviness on her shoulders.   Welchol [Colesevelam] Other (See Comments)    Reaction not cited   Zetia [Ezetimibe]    Zolpidem     Can tolerate in 2024   Acetaminophen Other (See Comments) and Rash    Facial rash   Fexofenadine Palpitations and Other (See Comments)    Heart races   Ibuprofen Rash   Latex Rash and Other (See Comments)    Skin gets red    Levofloxacin Other (See Comments) and Palpitations    HEART RACING   Mold Extract [Trichophyton Mentagrophyte] Other (See Comments)    Bumps on back, stops up sinuses.   Molds & Smuts Rash and Other (See Comments)    Bumps on back, stops up sinuses.   Nickel Rash   Tape Rash   Trichophyton Rash and Other (See Comments)    Bumps on back, stops up sinuses    The results of significant diagnostics from this hospitalization (including imaging, microbiology, ancillary and laboratory) are listed below for reference.     Microbiology: No results found for this or any previous visit (from the past 240 hour(s)).  Procedures/Studies: DG Chest Port 1 View  Result Date: 08/25/2022 CLINICAL DATA:  Congestive heart failure, failure to thrive. EXAM: PORTABLE CHEST 1 VIEW COMPARISON:  May 23, 2021. FINDINGS: The heart size and mediastinal contours are within normal limits. Both lungs are clear. The visualized skeletal structures are unremarkable. IMPRESSION: No active disease. Electronically Signed   By: Marijo Conception M.D.   On: 08/25/2022 13:52   CT ABDOMEN PELVIS W CONTRAST  Result Date: 08/24/2022 CLINICAL DATA:  Unintended weight loss. EXAM: CT ABDOMEN AND PELVIS WITH CONTRAST TECHNIQUE: Multidetector CT imaging of the abdomen and pelvis was performed using the standard protocol following bolus administration of intravenous contrast. RADIATION DOSE REDUCTION: This exam was performed according to the departmental dose-optimization program which includes automated exposure control, adjustment of the mA and/or kV according to patient size and/or use of iterative reconstruction technique. CONTRAST:  149m OMNIPAQUE IOHEXOL 300 MG/ML  SOLN COMPARISON:  07/25/2022 FINDINGS: Lower chest: Dependent atelectasis noted with  trace bilateral pleural effusions. Hepatobiliary: Tiny hypodensities scattered in both hepatic lobes are too small to characterize but likely benign. No followup imaging is recommended. Small area of low attenuation in the anterior liver, adjacent to the falciform ligament, is in a characteristic location for focal fatty deposition. There is no evidence for gallstones, gallbladder wall thickening, or pericholecystic fluid. No intrahepatic or extrahepatic biliary dilation. Pancreas: No focal mass lesion. No dilatation of the main duct. No intraparenchymal cyst. No peripancreatic edema. Spleen: No splenomegaly. No focal mass lesion. Adrenals/Urinary Tract: No adrenal nodule or mass. Kidneys unremarkable. No  evidence for hydroureter. The urinary bladder appears normal for the degree of distention. Stomach/Bowel: Moderate to large hiatal hernia. Duodenum is normally positioned as is the ligament of Treitz. No small bowel wall thickening. No small bowel dilatation. The terminal ileum is normal. The appendix is not well visualized, but there is no edema or inflammation in the region of the cecum. No gross colonic mass. No colonic wall thickening. Diverticular changes are noted in the left colon without evidence of diverticulitis. Vascular/Lymphatic: There is moderate atherosclerotic calcification of the abdominal aorta without aneurysm. There is no gastrohepatic or hepatoduodenal ligament lymphadenopathy. No retroperitoneal or mesenteric lymphadenopathy. No pelvic sidewall lymphadenopathy. Reproductive: Hysterectomy.  There is no adnexal mass. Other: No intraperitoneal free fluid. Musculoskeletal: No worrisome lytic or sclerotic osseous abnormality. Stable appearance of compression fractures involving T10-L4 inclusively. IMPRESSION: 1. No acute findings in the abdomen or pelvis. Specifically, no findings to explain the patient's history of weight loss. 2. Moderate to large hiatal hernia. 3. Left colonic diverticulosis without diverticulitis. 4. Stable appearance of compression fractures involving all levels from T10-L4 inclusively. 5.  Aortic Atherosclerosis (ICD10-I70.0). Electronically Signed   By: Misty Stanley M.D.   On: 08/24/2022 13:26    Labs: BNP (last 3 results) Recent Labs    07/24/22 1500  BNP 69.6   Basic Metabolic Panel: Recent Labs  Lab 08/26/22 0610 08/27/22 0610  NA 136 137  K 3.8 3.4*  CL 104 106  CO2 26 24  GLUCOSE 93 82  BUN <5* <5*  CREATININE 0.92 1.05*  CALCIUM 7.8* 7.8*   Recent Labs  Lab 08/26/22 0610 08/27/22 0610  WBC 6.0 5.7  HGB 10.9* 11.1*  HCT 30.9* 32.0*  MCV 95.1 96.4  PLT 270 255  CBG: Recent Labs  Lab 08/25/22 1232 08/25/22 1819 08/26/22 0001  08/26/22 0521 08/26/22 1139  GLUCAP 103* 114* 105* 103* 135*  Anemia work up No results for input(s): "VITAMINB12", "FOLATE", "FERRITIN", "TIBC", "IRON", "RETICCTPCT" in the last 72 hours. Urinalysis    Component Value Date/Time   COLORURINE AMBER (A) 07/25/2022 0330   APPEARANCEUR CLOUDY (A) 07/25/2022 0330   LABSPEC 1.013 07/25/2022 0330   PHURINE 5.0 07/25/2022 0330   GLUCOSEU NEGATIVE 07/25/2022 0330   HGBUR NEGATIVE 07/25/2022 0330   BILIRUBINUR NEGATIVE 07/25/2022 0330   KETONESUR 20 (A) 07/25/2022 0330   PROTEINUR NEGATIVE 07/25/2022 0330   NITRITE NEGATIVE 07/25/2022 0330   LEUKOCYTESUR SMALL (A) 07/25/2022 0330   Sepsis Labs Recent Labs  Lab 08/26/22 0610 08/27/22 0610  WBC 6.0 5.7   Microbiology No results found for this or any previous visit (from the past 240 hour(s)). Time coordinating discharge: 35 minutes  SIGNED: Antonieta Pert, MD  [facility] 09/01/2022, 9:34 AM  If 7PM-7AM, please contact night-coverage [email or web address]

## 2022-09-01 NOTE — TOC Transition Note (Signed)
Transition of Care [facility]) - CM/SW Discharge Note   Patient Details  Name: Donna Bailey MRN: [id number] Date of Birth: 1945-08-10  Transition of Care [facility]) CM/SW Contact:  Vassie Moselle, [place] Phone Number: 09/01/2022, 12:17 PM   Clinical Narrative:    Pt is to transfer to [facility] for SNF placement. Pt will be going to room 113. RN to call report to [phone]. Pt aware and agreeable to discharge plans. 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 Pt has her own sheets and blanket with her. Informed SNF of allergy. Pt will be transported via [facility].   Final next level of care: Skilled Nursing Facility Barriers to Discharge: Barriers Resolved   Patient Goals and CMS Choice CMS Medicare.gov Compare Post Acute Care list provided to:: Patient Choice offered to / list presented to : Patient  Discharge Placement PASRR number recieved: 08/25/22 PASRR number recieved: 08/25/22            Patient chooses bed at: [facility] Patient to be transferred to facility by: [place] Name of family member notified: Patient Patient and family notified of of transfer: 09/01/22  Discharge Plan and Services Additional resources added to the After Visit Summary for   In-house Referral: Clinical Social Work Discharge Planning Services: CM Consult Post Acute Care Choice: [place]          DME Arranged: N/A DME Agency: NA                  Social Determinants of Health (SDOH) Interventions SDOH Screenings   Depression (PHQ2-9): Low Risk  (03/15/2020)  Tobacco Use: Low Risk  (08/24/2022)     Readmission Risk Interventions    09/01/2022   12:16 PM 11/27/2021   11:39 AM  Readmission Risk Prevention Plan  Transportation Screening Complete Complete  PCP or Specialist Appt within 5-7 Days Complete Complete  Home Care Screening Complete Complete  Medication Review (RN CM) Complete Complete

## 2022-09-01 NOTE — Care Management Important Message (Signed)
Important Message  Patient Details IM Letter given. Name: Donna Bailey MRN: [id number] Date of Birth: 11-23-1945   Medicare Important Message Given:  Yes     [name] 09/01/2022, 11:22 AM
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
## 2022-09-01 NOTE — Progress Notes (Signed)
Nurse called report to the nurse receiving this patient at [facility].

## 2022-09-04 ENCOUNTER — Encounter (HOSPITAL_COMMUNITY): Payer: Self-pay

## 2022-09-04 ENCOUNTER — Ambulatory Visit (HOSPITAL_COMMUNITY): Admit: 2022-09-04 | Payer: Medicare Other | Admitting: Internal Medicine

## 2022-09-04 SURGERY — ESOPHAGOGASTRODUODENOSCOPY (EGD) WITH PROPOFOL
Anesthesia: Monitor Anesthesia Care

## 2022-09-05 ENCOUNTER — Ambulatory Visit: Payer: Medicare Other | Admitting: Podiatry

## 2022-09-26 ENCOUNTER — Telehealth: Payer: Self-pay | Admitting: Internal Medicine

## 2022-09-26 NOTE — Telephone Encounter (Signed)
Patient is calling wishing to speak to a nurse about scheduling a telephone visit with a doctor, states she is trying to get advice on what she should do regarding getting a feeding tube put in to prolong her life. Says this decision needs to made soon and is wishing to speak with someone as soon as possible. Please advise

## 2022-09-26 NOTE — Telephone Encounter (Signed)
Spoke with pt and she is aware of Dr. Perry's recommendations. 

## 2022-09-26 NOTE — Telephone Encounter (Signed)
Linda, relay the following:  Certainly, a feeding tube would allow adequate nutrition (calories) and hydration, if those parameters cannot be met otherwise. A feeding tube would be placed by interventional radiology and the type and rate of feeding would be determined by dietician/nutritional services. These orders and monitoring would be through your primary care provider (who provides your regular continuity care) and not gastroenterology. You should sit down with your PCP to discuss.

## 2022-09-26 NOTE — Telephone Encounter (Signed)
Pt is wondering if she should have a feeding tube put in. She is wanting to know if [name] thinks this may help prolong her life for at least a year. Please advise.

## 2022-10-09 ENCOUNTER — Telehealth: Payer: Self-pay
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
## 2022-10-10 ENCOUNTER — Other Ambulatory Visit: Payer: Self-pay | Admitting: Physician Assistant

## 2022-10-10 DIAGNOSIS — R110 Nausea: Secondary | ICD-10-CM

## 2022-10-20 ENCOUNTER — Ambulatory Visit (INDEPENDENT_AMBULATORY_CARE_PROVIDER_SITE_OTHER): Payer: Medicare Other | Admitting: Podiatry

## 2022-10-20 ENCOUNTER — Encounter: Payer: Self-pay | Admitting: Podiatry

## 2022-10-20 DIAGNOSIS — B351 Tinea unguium: Secondary | ICD-10-CM | POA: Diagnosis not present

## 2022-10-20 DIAGNOSIS — R600 Localized edema: Secondary | ICD-10-CM

## 2022-10-20 DIAGNOSIS — M79675 Pain in left toe(s): Secondary | ICD-10-CM | POA: Diagnosis not present

## 2022-10-20 DIAGNOSIS — M79674 Pain in right toe(s): Secondary | ICD-10-CM | POA: Diagnosis not present

## 2022-10-20 NOTE — Progress Notes (Signed)
This patient returns to my office for at risk foot care.  This patient requires this care by a professional since this patient will be at risk due to having neuropathy according to this patient. This patient is unable to cut nails himself since the patient cannot reach his nails.These nails are painful walking and wearing shoes.  This patient presents for at risk foot care today.  General Appearance  Alert, conversant and in no acute stress.  Vascular  Dorsalis pedis and posterior tibial  pulses are palpable  bilaterally.  Capillary return is within normal limits  bilaterally. Temperature is within normal limits  bilaterally.  Neurologic  Senn-Weinstein monofilament wire test within normal limits  bilaterally. Muscle power within normal limits bilaterally.  Nails Thick disfigured discolored nails with subungual debris  from hallux to fifth toes bilaterally. No evidence of bacterial infection or drainage bilaterally.  Orthopedic  No limitations of motion  feet .  No crepitus or effusions noted.  No bony pathology or digital deformities noted.  Skin  normotropic skin with no porokeratosis noted bilaterally.  No signs of infections or ulcers noted.     Onychomycosis  Pain in right toes  Pain in left toes  Consent was obtained for treatment procedures.   Mechanical debridement of nails 1-5  bilaterally performed with a nail nipper.  Filed with dremel without incident.    Return office visit    4 months                  Told patient to return for periodic foot care and evaluation due to potential at risk complications.   Gardiner Barefoot DPM

## 2022-11-03 ENCOUNTER — Other Ambulatory Visit: Payer: Self-pay | Admitting: Internal Medicine

## 2022-11-04 NOTE — Telephone Encounter (Signed)
PT calling for her cough syrup. Pharm said she had to call Dr. To order it. Pls call PT to advise. I will let her know it needs a Dr's signature so it may take some time.   [facility] [address]   Guaifen-dogene 100-1-0 MG/5 ML

## 2022-11-10 NOTE — Telephone Encounter (Signed)
Called [facility] [address].  Per [place], pharmacy tech.,  guaifenesin-codeine 100/10 syrup was filled and picked up by patient on 11/04/2022.  Nothing further needed.  Will close encounter.
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
## 2022-11-24 ENCOUNTER — Other Ambulatory Visit: Payer: Self-pay

## 2022-11-24 ENCOUNTER — Ambulatory Visit (HOSPITAL_BASED_OUTPATIENT_CLINIC_OR_DEPARTMENT_OTHER): Payer: Medicare Other | Attending: Family Medicine | Admitting: Physical Therapy

## 2022-11-24 ENCOUNTER — Encounter (HOSPITAL_BASED_OUTPATIENT_CLINIC_OR_DEPARTMENT_OTHER): Payer: Self-pay | Admitting: Physical Therapy

## 2022-11-24 DIAGNOSIS — M6281 Muscle weakness (generalized): Secondary | ICD-10-CM | POA: Diagnosis present

## 2022-11-24 DIAGNOSIS — R2689 Other abnormalities of gait and mobility: Secondary | ICD-10-CM | POA: Diagnosis present

## 2022-11-24 DIAGNOSIS — R2681 Unsteadiness on feet: Secondary | ICD-10-CM | POA: Diagnosis present

## 2022-11-24 DIAGNOSIS — M5459 Other low back pain: Secondary | ICD-10-CM

## 2022-11-28 ENCOUNTER — Other Ambulatory Visit: Payer: Self-pay | Admitting: Physician Assistant

## 2022-12-02 ENCOUNTER — Ambulatory Visit (HOSPITAL_BASED_OUTPATIENT_CLINIC_OR_DEPARTMENT_OTHER): Payer: Medicare Other | Admitting: Physical Therapy

## 2022-12-02 ENCOUNTER — Encounter (HOSPITAL_BASED_OUTPATIENT_CLINIC_OR_DEPARTMENT_OTHER): Payer: Self-pay | Admitting: Physical Therapy

## 2022-12-02 DIAGNOSIS — M6281 Muscle weakness (generalized): Secondary | ICD-10-CM

## 2022-12-02 DIAGNOSIS — M5459 Other low back pain: Secondary | ICD-10-CM

## 2022-12-02 DIAGNOSIS — R2689 Other abnormalities of gait and mobility: Secondary | ICD-10-CM

## 2022-12-02 DIAGNOSIS — R2681 Unsteadiness on feet: Secondary | ICD-10-CM
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
## 2022-12-03 ENCOUNTER — Ambulatory Visit (HOSPITAL_BASED_OUTPATIENT_CLINIC_OR_DEPARTMENT_OTHER): Payer: Medicare Other | Admitting: Physical Therapy
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
## 2022-12-09 ENCOUNTER — Encounter (HOSPITAL_BASED_OUTPATIENT_CLINIC_OR_DEPARTMENT_OTHER): Payer: Self-pay | Admitting: Physical Therapy

## 2022-12-09 ENCOUNTER — Ambulatory Visit (HOSPITAL_BASED_OUTPATIENT_CLINIC_OR_DEPARTMENT_OTHER): Payer: Medicare Other | Attending: Family Medicine | Admitting: Physical Therapy

## 2022-12-09 DIAGNOSIS — R2689 Other abnormalities of gait and mobility: Secondary | ICD-10-CM

## 2022-12-09 DIAGNOSIS — M5459 Other low back pain: Secondary | ICD-10-CM | POA: Diagnosis present

## 2022-12-09 DIAGNOSIS — R2681 Unsteadiness on feet: Secondary | ICD-10-CM | POA: Insufficient documentation

## 2022-12-09 DIAGNOSIS — M6281 Muscle weakness (generalized): Secondary | ICD-10-CM

## 2022-12-16 ENCOUNTER — Encounter (HOSPITAL_BASED_OUTPATIENT_CLINIC_OR_DEPARTMENT_OTHER): Payer: Self-pay | Admitting: Physical Therapy

## 2022-12-16 ENCOUNTER — Ambulatory Visit (HOSPITAL_BASED_OUTPATIENT_CLINIC_OR_DEPARTMENT_OTHER): Payer: Medicare Other | Admitting: Physical Therapy

## 2022-12-16 DIAGNOSIS — R2681 Unsteadiness on feet: Secondary | ICD-10-CM

## 2022-12-16 DIAGNOSIS — M6281 Muscle weakness (generalized): Secondary | ICD-10-CM

## 2022-12-16 DIAGNOSIS — M5459 Other low back pain: Secondary | ICD-10-CM
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
## 2022-12-26 ENCOUNTER — Encounter (HOSPITAL_BASED_OUTPATIENT_CLINIC_OR_DEPARTMENT_OTHER): Payer: Self-pay | Admitting: Physical Therapy

## 2022-12-26 ENCOUNTER — Ambulatory Visit (HOSPITAL_BASED_OUTPATIENT_CLINIC_OR_DEPARTMENT_OTHER): Payer: Medicare Other | Admitting: Physical Therapy

## 2022-12-26 DIAGNOSIS — M5459 Other low back pain: Secondary | ICD-10-CM

## 2022-12-26 DIAGNOSIS — R2681 Unsteadiness on feet: Secondary | ICD-10-CM

## 2022-12-26 DIAGNOSIS — M6281 Muscle weakness (generalized): Secondary | ICD-10-CM

## 2022-12-26 DIAGNOSIS — R2689 Other abnormalities of gait and mobility: Secondary | ICD-10-CM
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
## 2023-01-02 ENCOUNTER — Ambulatory Visit (HOSPITAL_BASED_OUTPATIENT_CLINIC_OR_DEPARTMENT_OTHER): Payer: Medicare Other | Admitting: Physical Therapy

## 2023-01-02 ENCOUNTER — Encounter (HOSPITAL_BASED_OUTPATIENT_CLINIC_OR_DEPARTMENT_OTHER): Payer: Self-pay | Admitting: Physical Therapy

## 2023-01-02 DIAGNOSIS — R2689 Other abnormalities of gait and mobility: Secondary | ICD-10-CM

## 2023-01-02 DIAGNOSIS — R2681 Unsteadiness on feet: Secondary | ICD-10-CM

## 2023-01-02 DIAGNOSIS — M5459 Other low back pain: Secondary | ICD-10-CM

## 2023-01-02 DIAGNOSIS — M6281 Muscle weakness (generalized): Secondary | ICD-10-CM
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
## 2023-01-06 ENCOUNTER — Other Ambulatory Visit (HOSPITAL_BASED_OUTPATIENT_CLINIC_OR_DEPARTMENT_OTHER): Payer: Self-pay | Admitting: Neurological Surgery

## 2023-01-06 DIAGNOSIS — M5416 Radiculopathy, lumbar region: Secondary | ICD-10-CM

## 2023-01-07 ENCOUNTER — Other Ambulatory Visit: Payer: Self-pay | Admitting: *Deleted

## 2023-01-07 ENCOUNTER — Ambulatory Visit (HOSPITAL_COMMUNITY)
Admission: RE | Admit: 2023-01-07 | Discharge: 2023-01-07 | Disposition: A | Discharge status: Home / Self Care | Payer: Medicare Other | Source: Ambulatory Visit | Attending: Physician Assistant | Admitting: Physician Assistant

## 2023-01-07 ENCOUNTER — Ambulatory Visit (INDEPENDENT_AMBULATORY_CARE_PROVIDER_SITE_OTHER): Payer: Medicare Other | Admitting: Physician Assistant

## 2023-01-07 ENCOUNTER — Ambulatory Visit (HOSPITAL_BASED_OUTPATIENT_CLINIC_OR_DEPARTMENT_OTHER): Payer: Medicare Other | Admitting: Physical Therapy

## 2023-01-07 VITALS — BP 127/80 | HR 76 | Temp 97.6°F | Wt 122.0 lb

## 2023-01-07 DIAGNOSIS — I771 Stricture of artery: Secondary | ICD-10-CM
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
## 2023-01-08 ENCOUNTER — Ambulatory Visit (HOSPITAL_BASED_OUTPATIENT_CLINIC_OR_DEPARTMENT_OTHER): Payer: Medicare Other | Admitting: Physical Therapy

## 2023-01-08 ENCOUNTER — Other Ambulatory Visit: Payer: Self-pay

## 2023-01-08 DIAGNOSIS — I771 Stricture of artery: Secondary | ICD-10-CM

## 2023-01-14 ENCOUNTER — Encounter: Payer: Self-pay | Admitting: Nurse Practitioner

## 2023-01-14 ENCOUNTER — Ambulatory Visit (INDEPENDENT_AMBULATORY_CARE_PROVIDER_SITE_OTHER): Payer: Medicare Other | Admitting: Nurse Practitioner

## 2023-01-14 ENCOUNTER — Ambulatory Visit: Payer: Medicare Other

## 2023-01-14 ENCOUNTER — Inpatient Hospital Stay (HOSPITAL_COMMUNITY): Admission: RE | Admit: 2023-01-14 | Payer: Medicare Other | Source: Ambulatory Visit

## 2023-01-14 ENCOUNTER — Ambulatory Visit (HOSPITAL_BASED_OUTPATIENT_CLINIC_OR_DEPARTMENT_OTHER): Payer: Medicare Other | Admitting: Physical Therapy

## 2023-01-14 VITALS — BP 140/90 | HR 77 | Temp 97.9°F | Ht 60.0 in | Wt 126.0 lb

## 2023-01-14 DIAGNOSIS — H6501 Acute serous otitis media, right ear: Secondary | ICD-10-CM | POA: Diagnosis not present

## 2023-01-14 DIAGNOSIS — J45909 Unspecified asthma, uncomplicated: Secondary | ICD-10-CM | POA: Diagnosis not present

## 2023-01-14 DIAGNOSIS — U071 COVID-19: Secondary | ICD-10-CM | POA: Diagnosis not present

## 2023-01-14 DIAGNOSIS — H6590 Unspecified nonsuppurative otitis media, unspecified ear: Secondary | ICD-10-CM | POA: Insufficient documentation

## 2023-01-14 MED ORDER — PREDNISONE 10 MG PO TABS
20.0000 mg | ORAL_TABLET | Freq: Every day | ORAL | 0 refills | Status: AC
Start: 2023-01-14 — End: 2023-01-19

## 2023-01-14 MED ORDER — FLUTICASONE PROPIONATE 50 MCG/ACT NA SUSP
2.0000 | Freq: Every day | NASAL | 2 refills | Status: DC
Start: 2023-01-14 — End: 2023-06-07

## 2023-01-14 MED ORDER — OXYMETAZOLINE HCL 0.05 % NA SOLN
1.0000 | Freq: Two times a day (BID) | NASAL | 0 refills | Status: DC
Start: 2023-01-14 — End: 2023-06-07

## 2023-01-14 MED ORDER — MOLNUPIRAVIR EUA 200MG CAPSULE
4.0000 | ORAL_CAPSULE | Freq: Two times a day (BID) | ORAL | 0 refills | Status: AC
Start: 2023-01-14 — End: 2023-01-19

## 2023-01-14 MED ORDER — ALBUTEROL SULFATE HFA 108 (90 BASE) MCG/ACT IN AERS
2.0000 | INHALATION_SPRAY | Freq: Four times a day (QID) | RESPIRATORY_TRACT | 2 refills | Status: DC | PRN
Start: 2023-01-14 — End: 2023-10-09

## 2023-01-14 MED ORDER — GUAIFENESIN-CODEINE 100-10 MG/5ML PO SOLN
5.0000 mL | Freq: Four times a day (QID) | ORAL | 0 refills | Status: DC | PRN
Start: 2023-01-14 — End: 2023-12-15

## 2023-01-14 NOTE — Progress Notes (Signed)
@Patient  ID: Donna Bailey, female    DOB: [birth year], 77 y.o.   MRN: [id number]  Chief Complaint  Patient presents with   Follow-up    Pt started a zpak Monday. Chest congestion, occ cough,     Referring provider: Daisy Floro, MD  HPI: 77 year old female, never smoker followed for asthma and insomnia. She is a patient of Dr. Roxy Cedar and last seen in office 04/29/2022. Past medical history significant for   TEST/EVENTS:  08/25/2022 CXR: Both lungs are clear  04/29/2022: OV with Dr. Maple Hudson.  History of allergic tinnitus, chronic bronchitis, chronic insomnia.  She has an intolerance to corn products as well as many other agents.  She had allergy skin testing January 2023 which was negative.  Needs a refill of cough syrup.  She has not to have home sleep test done.  The nasal thermistor probe itched and the curing case had "mold" which she said contaminated her space.  She endorses allergies to polyester and corn.  Particularly concerned about aflatoxin and corn.  Do not have objective documentation or clear syndrome pattern.  She is using zolpidem 10 mg at bedtime which works well.  Has not had a recent exacerbation.  Not using any inhalers.  01/14/2023: Today - acute Patient presents today for acute visit.  Her husband was sick over the weekend with similar symptoms.  She then started feeling poorly on Monday with sinus symptoms and cough.  She was started on a Z-Pak by her PCP.  Has not noticed any improvement thus far.  Her cough is productive with thick mucus.  Since she started on Z-Pak, it no longer has a color to it.  She has more shortness of breath than she usually does.  She also has noticed more wheezing.  Having a lot of chest congestion.  Her sinuses feel very full and are causing her to have pressure/pain in her right ear.  She also has headaches, sore throat and chills.  Has not checked her temperature to know if she has had a fever or not.  She did have some diarrhea this  morning.  Feels nauseous with the coughing.  No vomiting.  Denies any hemoptysis, lower extremity edema, orthopnea, palpitations, chest pain.  No drainage from her ear or hearing loss.  She is not on any sort of maintenance inhaler.  Does not have a rescue inhaler at home.  She has been using guaifenesin codeine cough syrup, previously prescribed by Dr. Maple Hudson which does help control her cough.  Has not performed any viral testing at home. She has decreased appetite but drinking plenty of fluids.   COVID +  Allergies  Allergen Reactions   Cephalosporins Itching and Other (See Comments)    Tolerated cefdinir in 2019   Crestor [Rosuvastatin] Other (See Comments)    Lost all muscle mobility    Doxycycline Diarrhea and Nausea And Vomiting   Formoterol Other (See Comments)    Reaction not recalled   Other Anaphylaxis, Itching, Rash and Other (See Comments)    Polyester-"pins sticking in her skin    Sulfa Antibiotics Anaphylaxis   Sulfonamide Derivatives Anaphylaxis   Ciprofloxacin Other (See Comments) and Rash   Nitrofurantoin Diarrhea, Nausea And Vomiting and Other (See Comments)    Also, "convulsions/constant shaking"- per patient   Penicillins Rash    Underarms (both) Has patient had a PCN reaction causing immediate rash, facial/tongue/throat swelling, SOB or lightheadedness with hypotension: YES Has patient had a PCN reaction causing  severe rash involving mucus membranes or skin necrosis: NO Has patient had a PCN reaction that required hospitalization NO Has patient had a PCN reaction occurring within the last 10 years: NO If all of the above answers are "NO", then may proceed with Cephalosporin use. Other reaction(s): Other (See Comments) Underarms (both) Other Reaction: "red hot skin" Underarms (both) Has patient had a PCN reaction causing immediate rash, facial/tongue/throat swelling, SOB or lightheadedness with hypotension: YES Has patient had a PCN reaction causing severe rash  involving mucus membranes or skin necrosis: NO Has patient had a PCN reaction that required hospitalization NO Has patient had a PCN reaction occurring within the last 10 years: NO If all of the above answers are "NO", then may proceed with Cephalosporin use.   Prednisone Itching    Pt states she cannot take prednisone with corn filler 05/19/2019.   Amlodipine Swelling and Other (See Comments)    Swelling in legs   Caffeine Other (See Comments)    Heart races   Carvedilol     dry mouth   Cephalexin Other (See Comments)    Reaction not recalled by the patient   Dilt-Xr [Diltiazem Hcl]     LE edema   Esomeprazole Other (See Comments)    Reaction not cited   Formoterol Fumarate Other (See Comments)    Reaction not recalled   Fosfomycin     Nerve pain in her legs   Gold Sodium Thiosulfate Other (See Comments)    Positive patch test   Gold-Containing Drug Products Other (See Comments)    Skin tingles   Hydralazine    Levofloxacin In D5w Other (See Comments)    Racing heart   Macrodantin [Nitrofurantoin Macrocrystal] Itching   Moxifloxacin Other (See Comments)    Caused hands to "shake"   Neomycin Other (See Comments)    Doesn't remember - allergist said not to use it because it could add more allergies- Positive patch test    Ofloxacin Itching and Other (See Comments)   Praluent [Alirocumab] Other (See Comments)    "Shaking"   Pregabalin Other (See Comments)   Ranexa [Ranolazine Er]     itching   Repatha [Evolocumab]     shaking   Valsartan Other (See Comments)    Pt reports this med causes her to feel sluggish and she feels heaviness on her shoulders.   Welchol [Colesevelam] Other (See Comments)    Reaction not cited   Zetia [Ezetimibe]    Zolpidem     Can tolerate in 2024   Acetaminophen Other (See Comments) and Rash    Facial rash   Fexofenadine Palpitations and Other (See Comments)    Heart races   Ibuprofen Rash   Latex Rash and Other (See Comments)    Skin  gets red    Levofloxacin Other (See Comments) and Palpitations    HEART RACING   Mold Extract [Trichophyton Mentagrophyte] Other (See Comments)    Bumps on back, stops up sinuses.   Molds & Smuts Rash and Other (See Comments)    Bumps on back, stops up sinuses.   Nickel Rash   Tape Rash   Trichophyton Rash and Other (See Comments)    Bumps on back, stops up sinuses    Immunization History  Administered Date(s) Administered   Fluad Quad(high Dose 65+) 05/26/2019, 05/02/2020, 05/02/2021   H1N1 06/22/2008   Influenza Split 05/10/2007, 05/11/2008, 05/15/2009, 04/10/2010, 05/05/2011, 06/12/2011, 05/20/2012, 05/04/2013, 03/31/2014   Influenza Whole 05/04/2010   Influenza, High Dose  Seasonal PF 04/16/2016, 05/05/2017, 05/13/2018, 05/02/2020   Influenza,inj,Quad PF,6+ Mos 05/04/2014, 04/17/2015, 04/29/2022   Influenza,inj,quad, With Preservative 05/04/2017   Influenza-Unspecified 05/14/2012, 04/28/2013   PFIZER(Purple Top)SARS-COV-2 Vaccination 09/09/2019, 09/30/2019, 06/08/2020   Pneumococcal Conjugate-13 06/13/2014   Pneumococcal Polysaccharide-23 06/09/2003, 05/04/2010   Tdap 11/22/2009, 04/18/2020    Past Medical History:  Diagnosis Date   Allergic rhinitis, cause unspecified    Anemia    Aneurysm of right conjunctiva    right eye    Anxiety    Asthma    Barrett's esophagus    CAD in native artery    a. 11/2017: STEMI s/p DES to prox-mid LAD; LCx stenosis managed medically. Case complicated by R forearm hematoma. b. Several subsequent caths, last in 04/2019 with stable disease // Myoview 11/2019: EF 61, normal perfusion; low risk    CKD (chronic kidney disease), stage II    Constipation    Cystocele    Deviated nasal septum    Diaphragmatic hernia without mention of obstruction or gangrene    Esophageal reflux    Fibromyalgia    H/O hiatal hernia    Hypertension    Insomnia, unspecified    Ischemic cardiomyopathy    a. EF 40-45% by echo 11/2017. // Echo 5/19: No new wall  motion abnormalities, EF 65, no pericardial effusion, normal aortic root   Kidney stones    hx of pt see Dr. Isabel Caprice   Medication intolerance    numerous   Migraine    Myalgia and myositis, unspecified    Myocardial infarct (HCC) 11/13/2017   Myocardial infarction [facility])    Nuclear stress tests    Cardiolite 2/18: no ischemia or scar, EF 78; Low Risk   Osteoarthrosis, unspecified whether generalized or localized, unspecified site    Pancreatitis    Peripheral neuropathy    Pneumonia    hx of   Pure hypercholesterolemia    Rectocele    Scoliosis (and kyphoscoliosis), idiopathic    Statin intolerance    Temporomandibular joint disorders, unspecified    Upper respiratory infection, acute 04/15/2018   Urticaria    Wheat allergy     Tobacco History: Social History   Tobacco Use  Smoking Status Never   Passive exposure: Never  Smokeless Tobacco Never   Counseling given: Not Answered   Outpatient Medications Prior to Visit  Medication Sig Dispense Refill   potassium chloride SA (KLOR-CON M) 20 MEQ tablet Take 1 tablet (20 mEq total) by mouth 2 (two) times daily for 5 days. (Patient not taking: Reported on 08/22/2022) 10 tablet 0   aspirin 81 MG chewable tablet Chew 81 mg by mouth daily.     bismuth subsalicylate (PEPTO BISMOL) 262 MG chewable tablet Chew 262-524 mg by mouth as needed for indigestion or diarrhea or loose stools.     BRILINTA 90 MG TABS tablet TAKE ONE TABLET BY MOUTH TWICE DAILY 60 tablet 2   carvedilol (COREG) 3.125 MG tablet Take 1 tablet (3.125 mg total) by mouth 2 (two) times daily. 180 tablet 3   cyanocobalamin (,VITAMIN B-12,) 1000 MCG/ML injection Inject 1,000 mcg into the muscle every Sunday.     diphenhydrAMINE (BENADRYL) 25 mg capsule Take 25 mg by mouth daily as needed for sleep or itching.     esomeprazole (NEXIUM) 40 MG capsule TAKE 1 CAPSULE BY MOUTH TWICE A DAY 180 capsule 1   famotidine (PEPCID) 40 MG tablet Take 40 mg by mouth daily as needed for  heartburn or indigestion.  folic acid (FOLVITE) 1 MG tablet Take 1 tablet (1 mg total) by mouth daily.     furosemide (LASIX) 20 MG tablet Take 1 tablet (20 mg total) by mouth daily.     Gabapentin 10 % CREA Apply 1-2 application  topically at bedtime. Feet     hydrALAZINE (APRESOLINE) 25 MG tablet Take 1 tablet (25 mg total) by mouth daily as needed (for BP greater than 160 systolic). 30 tablet 3   lidocaine (LIDODERM) 5 % Place 0.5-1 patches onto the skin daily. Apply 0.5-1 patch to spine and affected shoulders daily FROM 8 AM TO 8 PM DAILY after bath 30 patch 0   mirtazapine (REMERON) 7.5 MG tablet Take 1 tablet (7.5 mg total) by mouth at bedtime.     Multiple Vitamin (MULTIVITAMIN) LIQD Take 15 mLs by mouth daily.     NITRO-BID 2 % ointment Apply 1 g topically See admin instructions. Apply 1 gram daily to the finger tips as directed     nitroGLYCERIN (NITROSTAT) 0.4 MG SL tablet Place 1 tablet (0.4 mg total) under the tongue every 5 (five) minutes as needed for chest pain. 25 tablet 2   NON FORMULARY Take 1 tablet by mouth See admin instructions. MagBlue tablets- Take 1 tablet by mouth once a day     ondansetron (ZOFRAN-ODT) 4 MG disintegrating tablet DISSOLVE ONE TABLET BY MOUTH EVERY 6 HOURS AS NEEDED FOR NAUSEA AND VOMITING 30 tablet 1   oxyCODONE (OXY IR/ROXICODONE) 5 MG immediate release tablet Take 1 tablet (5 mg total) by mouth every 6 (six) hours as needed for up to 3 doses for moderate pain or severe pain. 3 tablet 0   sodium chloride (OCEAN) 0.65 % SOLN nasal spray Place 1 spray into both nostrils 2 (two) times daily.  0   No facility-administered medications prior to visit.     Review of Systems:   Constitutional: No weight loss or gain, night sweats, fevers. + chills, fatigue, lassitude. HEENT: No difficulty swallowing, tooth/dental problems. No sneezing, itching. +ear ache, nasal congestion, post nasal drip, headaches, sore throat CV:  No chest pain, orthopnea, PND,  swelling in lower extremities, anasarca, dizziness, palpitations, syncope Resp: +shortness of breath with exertion; productive cough; wheezing. No hemoptysis. No chest wall deformity GI:  +nausea, vomiting, diarrhea, decreased appetite. No heartburn, indigestion, abdominal pain, change in bowel habits, bloody stools.  GU: No dysuria, change in color of urine, urgency or frequency.   Skin: No rash, lesions, ulcerations MSK:  No joint pain or swelling.   Neuro: No dizziness or lightheadedness.  Psych: No depression or anxiety. Mood stable.     Physical Exam:  BP (!) 140/90   Pulse 77   Temp 97.9 F (36.6 C) (Oral)   Ht 5' (1.524 m)   Wt 126 lb (57.2 kg)   SpO2 99%   BMI 24.61 kg/m   GEN: Pleasant, interactive, well-kempt; frail; in no acute distress. HEENT:  Normocephalic and atraumatic. EACs patent bilaterally. TM pearly gray with present light reflex left. R TM intact, dull light reflex. Right serous otitis. PERRLA. Sclera white. Nasal turbinates erythematous, moist and patent bilaterally. Clear rhinorrhea present. Oropharynx erythematous and moist, without exudate or edema. No lesions, ulcerations NECK:  Supple w/ fair ROM. No JVD present. Normal carotid impulses w/o bruits. Thyroid symmetrical with no goiter or nodules palpated. No lymphadenopathy.   CV: RRR, no m/r/g, no peripheral edema. Pulses intact, +2 bilaterally. No cyanosis, pallor or clubbing. PULMONARY:  Unlabored, regular breathing. Minimal scattered  rhonchi b/l bases otherwise clear bilaterally A&P w/o wheezes/rales/rhonchi. Bronchitic cough. No accessory muscle use.  GI: BS present and normoactive. Soft, non-tender to palpation. No organomegaly or masses detected.  MSK: No erythema, warmth or tenderness. Cap refil <2 sec all extrem. No deformities or joint swelling noted.  Neuro: A/Ox3. No focal deficits noted.   Skin: Warm, no lesions or rashe Psych: Normal affect and behavior. Judgement and thought content  appropriate.     Lab Results:  CBC    Component Value Date/Time   WBC 5.7 08/27/2022 0610   RBC 3.32 (L) 08/27/2022 0610   HGB 11.1 (L) 08/27/2022 0610   HGB 13.4 05/22/2021 1113   HCT 32.0 (L) 08/27/2022 0610   HCT 41.5 05/22/2021 1113   PLT 255 08/27/2022 0610   PLT 384 05/22/2021 1113   MCV 96.4 08/27/2022 0610   MCV 84 05/22/2021 1113   MCH 33.4 08/27/2022 0610   MCHC 34.7 08/27/2022 0610   RDW 17.4 (H) 08/27/2022 0610   RDW 14.1 05/22/2021 1113   LYMPHSABS 2.4 08/25/2022 0731   LYMPHSABS 2.3 06/13/2019 1136   MONOABS 0.8 08/25/2022 0731   EOSABS 0.3 08/25/2022 0731   EOSABS 0.1 06/13/2019 1136   BASOSABS 0.1 08/25/2022 0731   BASOSABS 0.1 06/13/2019 1136    BMET    Component Value Date/Time   NA 137 08/27/2022 0610   NA 136 10/04/2021 1227   K 3.4 (L) 08/27/2022 0610   CL 106 08/27/2022 0610   CO2 24 08/27/2022 0610   GLUCOSE 82 08/27/2022 0610   BUN <5 (L) 08/27/2022 0610   BUN 29 (H) 10/04/2021 1227   CREATININE 1.05 (H) 08/27/2022 0610   CALCIUM 7.8 (L) 08/27/2022 0610   GFRNONAA 55 (L) 08/27/2022 0610   GFRAA 58 (L) 06/13/2019 1134    BNP    Component Value Date/Time   BNP 73.5 07/24/2022 1500     Imaging:  VAS [place] UPPER EXTREMITY ARTERIAL DUPLEX  Result Date: 01/07/2023  UPPER EXTREMITY DUPLEX STUDY Patient Name:  KAYDI KLEY Swaminathan  Date of Exam:   01/07/2023 Medical Rec #: [id number]           Accession #:    [id number] Date of Birth: 07/04/1946           Patient Gender: F Patient Age:   46 years Exam Location:  [facility] Procedure:      VAS [place] UPPER EXTREMITY ARTERIAL DUPLEX Referring Phys: EMMA COLLINS --------------------------------------------------------------------------------  History: Patient has a history of Left subclavian stent 11/25/21.  Risk Factors: Hypertension, coronary artery disease. Comparison Study: 01/01/22 Performing Technologist: [place] Ponder RVS  Examination Guidelines: A complete evaluation includes  B-mode imaging, spectral Doppler, color Doppler, and power Doppler as needed of all accessible portions of each vessel. Bilateral testing is considered an integral part of a complete examination. Limited examinations for reoccurring indications may be performed as noted.  Left Doppler Findings: +--------------+----------+--------+--------+--------+ Site          PSV (cm/s)WaveformStenosisComments +--------------+----------+--------+--------+--------+ Subclavian Mid                          stent    +--------------+----------+--------+--------+--------+ Axillary      71        biphasic                 +--------------+----------+--------+--------+--------+ Brachial Prox 93        biphasic                 +--------------+----------+--------+--------+--------+  Brachial Mid  94        biphasic                 +--------------+----------+--------+--------+--------+ Brachial Dist 79        biphasic                 +--------------+----------+--------+--------+--------+ Radial Dist   52        biphasic                 +--------------+----------+--------+--------+--------+ Ulnar Dist    33        biphasic                 +--------------+----------+--------+--------+--------+   Left Stent(s): +---------------+--------+--------+--------+--------+ subclavian     PSV cm/sStenosisWaveformComments +---------------+--------+--------+--------+--------+ Prox to Stent  177             biphasic         +---------------+--------+--------+--------+--------+ Proximal Stent 133             biphasic         +---------------+--------+--------+--------+--------+ Mid Stent      95              biphasic         +---------------+--------+--------+--------+--------+ Distal Stent   89              biphasic         +---------------+--------+--------+--------+--------+ Distal to Stent84              biphasic         +---------------+--------+--------+--------+--------+    Summary:  Left: Patent left subclavian artery stent without evidence of       stenosis. *See table(s) above for measurements and observations. Electronically signed by Lemar Livings MD on 01/07/2023 at 4:55:11 PM.    Final           No data to display          No results found for: "NITRICOXIDE"      Assessment & Plan:   Asthma with bronchitis Exacerbation secondary to COVID illness. We will treat her with depo inj 60 mg x 1 and prednisone burst. She was very concerned about being able to take this. I contacted the pharmacy who assured me that the tablets they have do not contain corn. I also provided her with a rescue inhaler for PRN use. Asthma action plan in place.   Patient Instructions  Continue nexium 1 capsule Twice daily  Continue famotidine 1 tab daily as needed for heartburn/indigestion Continue ondansetron 1 tab every 6 hours as needed for nausea/vomiting   Saline rinses 1-2 times a day. Use bottled, distilled water and sodium chloride packets. Use fluticasone nasal spray 2 sprays each nostril daily about 20-30 minutes after the saline Use afrin nasal spray 1 spray each nostril Twice daily for three days. Do not exceed three days of use Albuterol 2 puffs every 6 hours as needed for shortness of breath or wheezing  Molnupiravir 4 capsules Twice daily for 5 days. Take with food. Prednisone 20 mg daily for 5 days. Take in AM with food.  Guaifenesin codeine cough syrup 5 mL every 6 hours as needed for coughing. Do not drive after taking   Make sure you are drinking plenty of fluids to stay hydrated. Monitor your breathing at home. If you have difficulties talking in complete sentences, cannot keep food or liquids down, or have significant worsening or your symptoms, you need to go to the hospital  Follow up in 2 weeks with Dr. Maple Hudson (1st) or Katie Huxley Vanwagoner,NP. If symptoms do not improve or worsen, please contact office for sooner follow up or seek emergency care.    COVID-19  virus infection Rapid COVID testing positive in office today. Antiviral molnupiravir ordered - verified with pharmacy this does not contain corn. Side effect profile reviewed. We had a lengthy discussion surrounding viral vs bacterial infections. She is going to complete azithromycin but does not need any further antimicrobial therapy at this point. Advised her that viral symptoms can last 7-10 days with fatigue and cough lingering afterwards at times. Supportive care measures advised. CXR to rule out superimposed infection. Close follow up. Strict return/ED precautions reviewed.    Serous otitis media Serous otitis related to sinus symptoms/COVID illness. No acute infection present. Advised on measures to facilitate sinus/eustachian tube drainage. Counseled to notify her PCP if she developed worsening pain, any drainage from the ear, or changes in hearing.    I spent 50 minutes of dedicated to the care of this patient on the date of this encounter to include pre-visit review of records, face-to-face time with the patient discussing conditions above, post visit ordering of testing, clinical documentation with the electronic health record, making appropriate referrals as documented, and communicating necessary findings to members of the patients care team.  Noemi Chapel, NP 01/14/2023  Pt aware and understands NP's role.

## 2023-01-14 NOTE — Assessment & Plan Note (Signed)
Exacerbation secondary to COVID illness. We will treat her with depo inj 60 mg x 1 and prednisone burst. She was very concerned about being able to take this. I contacted the pharmacy who assured me that the tablets they have do not contain corn. I also provided her with a rescue inhaler for PRN use. Asthma action plan in place.   Patient Instructions  Continue nexium 1 capsule Twice daily  Continue famotidine 1 tab daily as needed for heartburn/indigestion Continue ondansetron 1 tab every 6 hours as needed for nausea/vomiting   Saline rinses 1-2 times a day. Use bottled, distilled water and sodium chloride packets. Use fluticasone nasal spray 2 sprays each nostril daily about 20-30 minutes after the saline Use afrin nasal spray 1 spray each nostril Twice daily for three days. Do not exceed three days of use Albuterol 2 puffs every 6 hours as needed for shortness of breath or wheezing  Molnupiravir 4 capsules Twice daily for 5 days. Take with food. Prednisone 20 mg daily for 5 days. Take in AM with food.  Guaifenesin codeine cough syrup 5 mL every 6 hours as needed for coughing. Do not drive after taking   Make sure you are drinking plenty of fluids to stay hydrated. Monitor your breathing at home. If you have difficulties talking in complete sentences, cannot keep food or liquids down, or have significant worsening or your symptoms, you need to go to the hospital  Follow up in 2 weeks with Dr. Maple Hudson (1st) or Katie Dong Nimmons,NP. If symptoms do not improve or worsen, please contact office for sooner follow up or seek emergency care.

## 2023-01-14 NOTE — Assessment & Plan Note (Signed)
Serous otitis related to sinus symptoms/COVID illness. No acute infection present. Advised on measures to facilitate sinus/eustachian tube drainage. Counseled to notify her PCP if she developed worsening pain, any drainage from the ear, or changes in hearing.

## 2023-01-14 NOTE — Patient Instructions (Addendum)
Continue nexium 1 capsule Twice daily  Continue famotidine 1 tab daily as needed for heartburn/indigestion Continue ondansetron 1 tab every 6 hours as needed for nausea/vomiting   Saline rinses 1-2 times a day. Use bottled, distilled water and sodium chloride packets. Use fluticasone nasal spray 2 sprays each nostril daily about 20-30 minutes after the saline Use afrin nasal spray 1 spray each nostril Twice daily for three days. Do not exceed three days of use Albuterol 2 puffs every 6 hours as needed for shortness of breath or wheezing  Molnupiravir 4 capsules Twice daily for 5 days. Take with food. Prednisone 20 mg daily for 5 days. Take in AM with food.  Guaifenesin codeine cough syrup 5 mL every 6 hours as needed for coughing. Do not drive after taking   Make sure you are drinking plenty of fluids to stay hydrated. Monitor your breathing at home. If you have difficulties talking in complete sentences, cannot keep food or liquids down, or have significant worsening or your symptoms, you need to go to the hospital  Follow up in 2 weeks with Dr. Maple Hudson (1st) or Katie Neira Bentsen,NP. If symptoms do not improve or worsen, please contact office for sooner follow up or seek emergency care.

## 2023-01-14 NOTE — Assessment & Plan Note (Addendum)
Rapid COVID testing positive in office today. Antiviral molnupiravir ordered - verified with pharmacy this does not contain corn. Side effect profile reviewed. We had a lengthy discussion surrounding viral vs bacterial infections. She is going to complete azithromycin but does not need any further antimicrobial therapy at this point. Advised her that viral symptoms can last 7-10 days with fatigue and cough lingering afterwards at times. Supportive care measures advised. CXR to rule out superimposed infection. Close follow up. Strict return/ED precautions reviewed.

## 2023-01-16 ENCOUNTER — Telehealth: Payer: Self-pay | Admitting: Nurse Practitioner

## 2023-01-16 ENCOUNTER — Ambulatory Visit (HOSPITAL_BASED_OUTPATIENT_CLINIC_OR_DEPARTMENT_OTHER)
Admission: RE | Admit: 2023-01-16 | Discharge: 2023-01-16 | Disposition: A | Discharge status: Home / Self Care | Payer: Medicare Other | Source: Ambulatory Visit | Attending: Neurological Surgery | Admitting: Neurological Surgery

## 2023-01-16 DIAGNOSIS — M5416 Radiculopathy, lumbar region: Secondary | ICD-10-CM

## 2023-01-16 NOTE — Telephone Encounter (Signed)
Spoke with patient. She states she is experiencing Covid symptoms-productive cough with phlegm, congestion-feels like its deep in her chest, rt ear ache, states she had a fever but couldn't read thermometer  Patient states husband is still in the hospital with Covid Patient is requesting a steroid injection today or something to help relieve symptoms before husband comes home. Patient states she is allergic to corn and mold   Katie since Dr. Maple Hudson is not here can you please advise?  Pharmacy [facility]

## 2023-01-16 NOTE — Telephone Encounter (Signed)
Spoke with patient. Went over recommendations. She verbalized understanding. NFN

## 2023-01-16 NOTE — Telephone Encounter (Signed)
Pt wants to inform us that her husband has tested positive for covid and she is still having symptoms. Pls Advise

## 2023-01-16 NOTE — Telephone Encounter (Signed)
She had a steroid shot on Wednesday and was prescribed oral steroids. It can take 7-10 days for the virus to run it's course and the cough/fatigue can linger for a period after that. If she has worsening respiratory symptoms or is unable to keep fluids down, she needs to go to the ED or UC. Thanks.

## 2023-01-21 ENCOUNTER — Ambulatory Visit (HOSPITAL_BASED_OUTPATIENT_CLINIC_OR_DEPARTMENT_OTHER): Payer: Medicare Other | Attending: Family Medicine | Admitting: Physical Therapy

## 2023-01-21 ENCOUNTER — Encounter (HOSPITAL_BASED_OUTPATIENT_CLINIC_OR_DEPARTMENT_OTHER): Payer: Self-pay | Admitting: Physical Therapy

## 2023-01-21 DIAGNOSIS — M6281 Muscle weakness (generalized): Secondary | ICD-10-CM | POA: Insufficient documentation

## 2023-01-21 DIAGNOSIS — M5459 Other low back pain: Secondary | ICD-10-CM | POA: Insufficient documentation

## 2023-01-21 DIAGNOSIS — R2681 Unsteadiness on feet: Secondary | ICD-10-CM | POA: Diagnosis present
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
## 2023-01-26 ENCOUNTER — Encounter: Payer: Self-pay | Admitting: Podiatry

## 2023-01-26 ENCOUNTER — Ambulatory Visit (INDEPENDENT_AMBULATORY_CARE_PROVIDER_SITE_OTHER): Payer: Medicare Other | Admitting: Podiatry

## 2023-01-26 DIAGNOSIS — M79674 Pain in right toe(s): Secondary | ICD-10-CM | POA: Diagnosis not present

## 2023-01-26 DIAGNOSIS — B351 Tinea unguium: Secondary | ICD-10-CM

## 2023-01-26 DIAGNOSIS — M79675 Pain in left toe(s): Secondary | ICD-10-CM | POA: Diagnosis not present

## 2023-01-26 DIAGNOSIS — N1831 Chronic kidney disease, stage 3a: Secondary | ICD-10-CM

## 2023-01-26 NOTE — Progress Notes (Signed)
This patient returns to my office for at risk foot care.  This patient requires this care by a professional since this patient will be at risk due to having neuropathy according to this patient.This patient is unable to cut nails herself since the patient cannot reach her nails.These nails are painful walking and wearing shoes.  This patient presents for at risk foot care today.  General Appearance  Alert, conversant and in no acute stress.  Vascular  Dorsalis pedis and posterior tibial  pulses are palpable  bilaterally.  Capillary return is within normal limits  bilaterally. Temperature is within normal limits  bilaterally.  Neurologic  Senn-Weinstein monofilament wire test within normal limits  bilaterally. Muscle power within normal limits bilaterally.  Nails Thick disfigured discolored nails with subungual debris  from hallux to fifth toes bilaterally. No evidence of bacterial infection or drainage bilaterally.  Orthopedic  No limitations of motion  feet .  No crepitus or effusions noted.  No bony pathology or digital deformities noted.  Skin  normotropic skin with no porokeratosis noted bilaterally.  No signs of infections or ulcers noted.     Onychomycosis  Pain in right toes  Pain in left toes  Consent was obtained for treatment procedures.   Mechanical debridement of nails 1-5  bilaterally performed with a nail nipper.  Filed with dremel without incident.    Return office visit    3 months                  Told patient to return for periodic foot care and evaluation due to potential at risk complications.   Helane Gunther DPM

## 2023-01-28 ENCOUNTER — Other Ambulatory Visit: Payer: Self-pay | Admitting: Vascular Surgery

## 2023-01-28 ENCOUNTER — Ambulatory Visit (HOSPITAL_BASED_OUTPATIENT_CLINIC_OR_DEPARTMENT_OTHER): Payer: Medicare Other | Admitting: Physical Therapy

## 2023-02-02 ENCOUNTER — Ambulatory Visit (HOSPITAL_BASED_OUTPATIENT_CLINIC_OR_DEPARTMENT_OTHER): Payer: Medicare Other | Admitting: Physical Therapy

## 2023-02-03 ENCOUNTER — Encounter (HOSPITAL_BASED_OUTPATIENT_CLINIC_OR_DEPARTMENT_OTHER): Payer: Self-pay | Admitting: Physical Therapy

## 2023-02-03 ENCOUNTER — Ambulatory Visit (HOSPITAL_BASED_OUTPATIENT_CLINIC_OR_DEPARTMENT_OTHER): Payer: Medicare Other | Attending: Family Medicine | Admitting: Physical Therapy

## 2023-02-03 DIAGNOSIS — M5459 Other low back pain: Secondary | ICD-10-CM | POA: Insufficient documentation

## 2023-02-03 DIAGNOSIS — R2681 Unsteadiness on feet: Secondary | ICD-10-CM | POA: Diagnosis present

## 2023-02-03 DIAGNOSIS — M6281 Muscle weakness (generalized): Secondary | ICD-10-CM | POA: Diagnosis present

## 2023-02-03 DIAGNOSIS — R2689 Other abnormalities of gait and mobility: Secondary | ICD-10-CM | POA: Diagnosis present
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
## 2023-02-10 ENCOUNTER — Ambulatory Visit (HOSPITAL_BASED_OUTPATIENT_CLINIC_OR_DEPARTMENT_OTHER): Payer: Medicare Other | Admitting: Physical Therapy

## 2023-02-10 ENCOUNTER — Encounter (HOSPITAL_BASED_OUTPATIENT_CLINIC_OR_DEPARTMENT_OTHER): Payer: Self-pay

## 2023-02-12 ENCOUNTER — Ambulatory Visit (HOSPITAL_BASED_OUTPATIENT_CLINIC_OR_DEPARTMENT_OTHER): Payer: Medicare Other | Admitting: Physical Therapy

## 2023-02-12 ENCOUNTER — Encounter (HOSPITAL_BASED_OUTPATIENT_CLINIC_OR_DEPARTMENT_OTHER): Payer: Self-pay | Admitting: Physical Therapy

## 2023-02-12 DIAGNOSIS — R2689 Other abnormalities of gait and mobility: Secondary | ICD-10-CM

## 2023-02-12 DIAGNOSIS — M5459 Other low back pain: Secondary | ICD-10-CM

## 2023-02-12 DIAGNOSIS — R2681 Unsteadiness on feet: Secondary | ICD-10-CM

## 2023-02-12 DIAGNOSIS — M6281 Muscle weakness (generalized): Secondary | ICD-10-CM
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
## 2023-02-17 ENCOUNTER — Encounter (HOSPITAL_BASED_OUTPATIENT_CLINIC_OR_DEPARTMENT_OTHER): Payer: Self-pay | Admitting: Physical Therapy

## 2023-02-17 ENCOUNTER — Ambulatory Visit (HOSPITAL_BASED_OUTPATIENT_CLINIC_OR_DEPARTMENT_OTHER): Payer: Medicare Other | Admitting: Physical Therapy

## 2023-02-17 DIAGNOSIS — M5459 Other low back pain: Secondary | ICD-10-CM | POA: Diagnosis not present

## 2023-02-17 DIAGNOSIS — M6281 Muscle weakness (generalized): Secondary | ICD-10-CM

## 2023-02-17 DIAGNOSIS — R2689 Other abnormalities of gait and mobility: Secondary | ICD-10-CM

## 2023-02-17 DIAGNOSIS — R2681 Unsteadiness on feet: Secondary | ICD-10-CM
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
## 2023-02-22 NOTE — Progress Notes (Deleted)
HPI female never smoker followed for bronchitis, allergic rhinitis, chronic insomnia, "intolerance of all corn products", complicated by GERD and multiple medical problems, CAD/MI/ stent Seroquel made her too dizzy. Belsomra 15 mg made her muscles stiff and left her groggy but did help her sleep. Rozerem did not help with sleep but made her dizzy She again requests prescription for zolpidem 10 mg, insisting that nothing else has worked as well to help her manage chronic insomnia. She limits foods with magnesium which she says make her feel hot She tells me she is allergic to nickel, gold and polyester "all of which conduct electricity". Therefore she wants a letter for her to present to [facility] [employer] asking them to change her electric meter to a "noncommunicating electric meter", so that [employer] will stop sending radio waves through her home. She prefers a manual meter check.  ------------------------------------------------------------------------------------------   04/29/22- 76 yoF followed for hx of allergic Rhinitis, chronic Bronchitis,  complicated by CAD/ MI/ stent/CM,  and intolerance to corn products. Obesity, Hyperlipidemia, Migraine, Hiatal Hernia, GERD/ Barretts, Vertebral Compression Fractures, -zolpidem,  Covid vax- 3 Phizer   , Flu vax- today standard -----Pt f/u o2 sats have been good, she is feeling unwell due to having a broken back Breathing ok. Sneezes in the morning, blows her nose then ok. We are out of high dose flu vax. She could get it elsewhere but chooses to get standard ax since she is here. Asks cough syrup refill to have available. Says she sustained multiple vertebral fx's during transfer from table to stretcher this Spring. Pending kyphoplasty.  02/24/23- 77 yoF followed for hx of allergic Rhinitis, chronic Bronchitis,  complicated by CAD/ MI/ stent/CM,  and intolerance to corn products. Obesity, Hyperlipidemia, Migraine, Hiatal Hernia, GERD/ Barretts, Vertebral  Compression Fractures, -zolpidem,  LOV 01/14/23- Cobb, NP- Acute sinusitis, + Covid, > Prednisone, molnupiravir, albuterol hfa, finish Zpak, continue nexium and famotidine  CXR 01/14/23-ordered, not done    ROS-see HPI   + = positive Constitutional:    weight loss, night sweats, fevers, chills, +fatigue, lassitude. HEENT:    headaches, difficulty swallowing, tooth/dental problems, sore throat,       sneezing, itching, ear ache, nasal congestion, post nasal drip, snoring CV:    chest pain, orthopnea, PND, swelling in lower extremities, anasarca,                           dizziness, palpitations Resp:   shortness of breath with exertion or at rest.                productive cough,   non-productive cough, coughing up of blood.              change in color of mucus.  wheezing.   Skin:    rash or lesions. GI:  No-   heartburn, indigestion, abdominal pain, nausea, vomiting, diarrhea,                 change in bowel habits, loss of appetite GU: dysuria, change in color of urine, no urgency or frequency.   flank pain. MS:   joint pain, stiffness, decreased range of motion, +back pain. Neuro-     nothing unusual Psych:  change in mood or affect.  depression or anxiety.   memory loss.  OBJ- Physical Exam General- Alert, Oriented, Affect-appropriate, Distress- none acute, +uncomfortable in exam chair-adjusted as needed to accomodate. Skin- rash-none, lesions- none, excoriation- none Lymphadenopathy- none Head-  Eyes- Gross vision intact, PERRLA, conjunctivae and secretions clear            Ears- Hearing, canals-normal            Nose- Clear, no-Septal dev, mucus, polyps, erosion, perforation             Throat- Mallampati II , mucosa clear , drainage- none, tonsils- atrophic, + teeth Neck- flexible , trachea midline, no stridor , thyroid nl, carotid no bruit Chest - symmetrical excursion , unlabored           Heart/CV- RRR , no murmur , no gallop  , no rub, nl s1 s2                            - JVD- none , edema- none, stasis changes- none, varices- none           Lung- clear to P&A, wheeze- none, cough- none , dullness-none, rub- none           Chest wall-  Abd-  Br/ Gen/ Rectal- Not done, not indicated Extrem- cyanosis- none, clubbing, none, atrophy- none, strength- nl Neuro- grossly intact to observation

## 2023-02-24 ENCOUNTER — Encounter (HOSPITAL_BASED_OUTPATIENT_CLINIC_OR_DEPARTMENT_OTHER): Payer: Self-pay | Admitting: Physical Therapy

## 2023-02-24 ENCOUNTER — Ambulatory Visit: Payer: Medicare Other | Admitting: Internal Medicine

## 2023-02-24 ENCOUNTER — Ambulatory Visit (HOSPITAL_BASED_OUTPATIENT_CLINIC_OR_DEPARTMENT_OTHER): Payer: Medicare Other | Admitting: Physical Therapy

## 2023-02-24 DIAGNOSIS — M5459 Other low back pain: Secondary | ICD-10-CM | POA: Diagnosis not present

## 2023-02-24 DIAGNOSIS — R2681 Unsteadiness on feet: Secondary | ICD-10-CM

## 2023-02-24 DIAGNOSIS — M6281 Muscle weakness (generalized): Secondary | ICD-10-CM
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
## 2023-02-26 ENCOUNTER — Ambulatory Visit (HOSPITAL_BASED_OUTPATIENT_CLINIC_OR_DEPARTMENT_OTHER): Payer: Medicare Other | Admitting: Physical Therapy

## 2023-02-26 ENCOUNTER — Telehealth: Payer: Self-pay | Admitting: Physician Assistant

## 2023-02-26 NOTE — Telephone Encounter (Signed)
I called the requesting office to advise that pt will need tele appt for pre op though nothing available until 03/10/23 @ 1:40. Med rec and consent are done. I informed April that the Brilinta is followed by Dr. Heath Lark with [facility]. April will reach out to Dr. Lenell Antu fro clearance for Brilinta.   While I was noting the chart, April from surgeon's office called back and said  all they needed is pharm hold for Brilinta. April said she does not need cardiac clearance and she will call Dr. Verita Lamb office for Brilinta clearance. I called the pt and cancelled her tele, pt said thank you for the help. I will update all parties involved.

## 2023-02-26 NOTE — Telephone Encounter (Signed)
   Name: Donna Bailey  DOB: [birth year]  MRN: [id number]  Primary Cardiologist: Meriam Sprague, MD   Preoperative team, please contact this patient and set up a phone call appointment for further preoperative risk assessment. Please obtain consent and complete medication review. Thank you for your help.  Patient is on Brilinta, defer recommendations to prescribing physician, [facility].   Joni Reining, NP 02/26/2023, 12:35 PM [facility]

## 2023-02-26 NOTE — Telephone Encounter (Signed)
   Pre-operative Risk Assessment    Patient Name: Donna Bailey  DOB: Apr 14, 1946 MRN: [id number]      Request for Surgical Clearance    Procedure:   Kyphoplasty  Date of Surgery:  Clearance 03/04/23                                 Surgeon:  Dr. Joanette Gula Surgeon's Group or Practice Name:  [facility] Phone number:  [phone] Fax number:  [phone]   Type of Clearance Requested:   - Pharmacy:  Hold Ticagrelor (Brilinta)     Type of Anesthesia:  None    Additional requests/questions:  Please advise surgeon/provider what medications should be held.  Barbette Reichmann   02/26/2023, 11:48 AM

## 2023-03-03 ENCOUNTER — Ambulatory Visit (HOSPITAL_BASED_OUTPATIENT_CLINIC_OR_DEPARTMENT_OTHER): Payer: Medicare Other | Admitting: Physical Therapy

## 2023-03-05 ENCOUNTER — Encounter (HOSPITAL_BASED_OUTPATIENT_CLINIC_OR_DEPARTMENT_OTHER): Payer: Self-pay | Admitting: Physical Therapy

## 2023-03-05 ENCOUNTER — Ambulatory Visit (HOSPITAL_BASED_OUTPATIENT_CLINIC_OR_DEPARTMENT_OTHER): Payer: Medicare Other | Attending: Family Medicine | Admitting: Physical Therapy

## 2023-03-05 DIAGNOSIS — R2681 Unsteadiness on feet: Secondary | ICD-10-CM | POA: Diagnosis present

## 2023-03-05 DIAGNOSIS — M5459 Other low back pain: Secondary | ICD-10-CM | POA: Insufficient documentation

## 2023-03-05 DIAGNOSIS — M6281 Muscle weakness (generalized): Secondary | ICD-10-CM | POA: Diagnosis present
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
## 2023-03-06 NOTE — Progress Notes (Signed)
Fax received from [facility] on 02/26/23 for medical clearance/medication hold for kyphoplasty to be signed by B. Randie Heinz, MD.  Provider signed on 02/27/23, form faxed back to sender on 02/27/23, verified successful, sent to scan center.

## 2023-03-10 ENCOUNTER — Ambulatory Visit (HOSPITAL_BASED_OUTPATIENT_CLINIC_OR_DEPARTMENT_OTHER): Payer: Medicare Other | Admitting: Physical Therapy

## 2023-03-10 ENCOUNTER — Telehealth: Payer: Medicare Other

## 2023-03-12 ENCOUNTER — Ambulatory Visit (HOSPITAL_BASED_OUTPATIENT_CLINIC_OR_DEPARTMENT_OTHER): Payer: Medicare Other | Admitting: Physical Therapy

## 2023-03-17 ENCOUNTER — Ambulatory Visit (HOSPITAL_BASED_OUTPATIENT_CLINIC_OR_DEPARTMENT_OTHER): Payer: Medicare Other | Admitting: Physical Therapy

## 2023-03-19 ENCOUNTER — Ambulatory Visit (HOSPITAL_BASED_OUTPATIENT_CLINIC_OR_DEPARTMENT_OTHER): Payer: Medicare Other | Admitting: Physical Therapy

## 2023-03-24 ENCOUNTER — Ambulatory Visit (HOSPITAL_BASED_OUTPATIENT_CLINIC_OR_DEPARTMENT_OTHER): Payer: Medicare Other | Admitting: Physical Therapy

## 2023-03-26 ENCOUNTER — Encounter (HOSPITAL_BASED_OUTPATIENT_CLINIC_OR_DEPARTMENT_OTHER): Payer: Medicare Other | Admitting: Physical Therapy

## 2023-03-31 ENCOUNTER — Encounter (HOSPITAL_BASED_OUTPATIENT_CLINIC_OR_DEPARTMENT_OTHER): Payer: Medicare Other | Admitting: Physical Therapy

## 2023-04-29 ENCOUNTER — Ambulatory Visit (INDEPENDENT_AMBULATORY_CARE_PROVIDER_SITE_OTHER): Payer: Medicare Other | Admitting: Podiatry

## 2023-04-29 ENCOUNTER — Encounter: Payer: Self-pay | Admitting: Podiatry

## 2023-04-29 DIAGNOSIS — B351 Tinea unguium: Secondary | ICD-10-CM | POA: Diagnosis not present

## 2023-04-29 DIAGNOSIS — M79675 Pain in left toe(s): Secondary | ICD-10-CM

## 2023-04-29 DIAGNOSIS — N1831 Chronic kidney disease, stage 3a: Secondary | ICD-10-CM | POA: Diagnosis not present

## 2023-04-29 DIAGNOSIS — M79674 Pain in right toe(s): Secondary | ICD-10-CM | POA: Diagnosis not present

## 2023-04-29 NOTE — Progress Notes (Signed)
This patient returns to my office for at risk foot care.  This patient requires this care by a professional since this patient will be at risk due to having neuropathy according to this patient.This patient is unable to cut nails herself since the patient cannot reach her nails.These nails are painful walking and wearing shoes.  This patient presents for at risk foot care today.  General Appearance  Alert, conversant and in no acute stress.  Vascular  Dorsalis pedis and posterior tibial  pulses are palpable  bilaterally.  Capillary return is within normal limits  bilaterally. Temperature is within normal limits  bilaterally.  Neurologic  Senn-Weinstein monofilament wire test within normal limits  bilaterally. Muscle power within normal limits bilaterally.  Nails Thick disfigured discolored nails with subungual debris  from hallux to fifth toes bilaterally. No evidence of bacterial infection or drainage bilaterally.  Orthopedic  No limitations of motion  feet .  No crepitus or effusions noted.  No bony pathology or digital deformities noted.  Skin  normotropic skin with no porokeratosis noted bilaterally.  No signs of infections or ulcers noted.     Onychomycosis  Pain in right toes  Pain in left toes  Consent was obtained for treatment procedures.   Mechanical debridement of nails 1-5  bilaterally performed with a nail nipper.  Filed with dremel without incident.    Return office visit    3 months                  Told patient to return for periodic foot care and evaluation due to potential at risk complications.   Helane Gunther DPM

## 2023-04-30 ENCOUNTER — Ambulatory Visit: Payer: Medicare Other | Admitting: Internal Medicine

## 2023-05-16 NOTE — Progress Notes (Signed)
HPI female never smoker followed for bronchitis, allergic rhinitis, chronic insomnia, "intolerance of all corn products", complicated by GERD and multiple medical problems, CAD/MI/ stent Seroquel made her too dizzy. Belsomra 15 mg made her muscles stiff and left her groggy but did help her sleep. Rozerem did not help with sleep but made her dizzy She again requests prescription for zolpidem 10 mg, insisting that nothing else has worked as well to help her manage chronic insomnia. She limits foods with magnesium which she says make her feel hot She tells me she is allergic to nickel, gold and polyester "all of which conduct electricity". Therefore she wants a letter for her to present to [facility] asking them to change her electric meter to a "noncommunicating electric meter", so that [employer] will stop sending radio waves through her home. She prefers a manual meter check.  ------------------------------------------------------------------------------------------   04/29/22- 76 yoF followed for hx of allergic Rhinitis, chronic Bronchitis,  complicated by CAD/ MI/ stent/CM,  and intolerance to corn products. Obesity, Hyperlipidemia, Migraine, Hiatal Hernia, GERD/ Barretts, Vertebral Compression Fractures, -zolpidem,  Covid vax- 3 Phizer   , Flu vax- today standard -----Pt f/u o2 sats have been good, she is feeling unwell due to having a broken back Breathing ok. Sneezes in the morning, blows her nose then ok. We are out of high dose flu vax. She could get it elsewhere but chooses to get standard ax since she is here. Asks cough syrup refill to have available. Says she sustained multiple vertebral fx's during transfer from table to stretcher this Spring. Pending kyphoplasty.  05/19/23- 76 yoF followed for hx of allergic Rhinitis, chronic Bronchitis,  complicated by CAD/ MI/ stent/CM,  and intolerance to corn products. Obesity, Hyperlipidemia, Migraine, Hiatal Hernia, GERD/ Barretts, Vertebral  Compression Fractures, -zolpidem, Ventolin hfa, LOV 6/12 Cobb, NP-  Covid infection> Depomedrol and prednisone, saline rinse, Molnupiravir,  -----ACT score 25  Breathing has been good  Rarely needs her rescue inhaler. Discussed vaccine recommendations. Flu vax senior.  ROS-see HPI   + = positive Constitutional:    weight loss, night sweats, fevers, chills, +fatigue, lassitude. HEENT:    headaches, difficulty swallowing, tooth/dental problems, sore throat,       sneezing, itching, ear ache, nasal congestion, post nasal drip, snoring CV:    chest pain, orthopnea, PND, swelling in lower extremities, anasarca,                           dizziness, palpitations Resp:   shortness of breath with exertion or at rest.                productive cough,   non-productive cough, coughing up of blood.              change in color of mucus.  wheezing.   Skin:    rash or lesions. GI:  No-   heartburn, indigestion, abdominal pain, nausea, vomiting, diarrhea,                 change in bowel habits, loss of appetite GU: dysuria, change in color of urine, no urgency or frequency.   flank pain. MS:   joint pain, stiffness, decreased range of motion, +back pain. Neuro-     nothing unusual Psych:  change in mood or affect.  depression or anxiety.   memory loss.  OBJ- Physical Exam General- Alert, Oriented, Affect-appropriate, Distress- none acute, +uncomfortable in exam chair-adjusted as needed to accomodate.  Skin- rash-none, lesions- none, excoriation- none Lymphadenopathy- none Head-             Eyes- Gross vision intact, PERRLA, conjunctivae and secretions clear            Ears- Hearing, canals-normal            Nose- Clear, no-Septal dev, mucus, polyps, erosion, perforation             Throat- Mallampati II , mucosa clear , drainage- none, tonsils- atrophic, + teeth Neck- flexible , trachea midline, no stridor , thyroid nl, carotid no bruit Chest - symmetrical excursion , unlabored           Heart/CV-  RRR , no murmur , no gallop  , no rub, nl s1 s2                           - JVD- none , edema- none, stasis changes- none, varices- none           Lung- clear to P&A, wheeze- none, cough- none , dullness-none, rub- none           Chest wall-  Abd-  Br/ Gen/ Rectal- Not done, not indicated Extrem- cyanosis- none, clubbing, none, atrophy- none, strength- nl Neuro- grossly intact to observation

## 2023-05-19 ENCOUNTER — Encounter: Payer: Self-pay | Admitting: Internal Medicine

## 2023-05-19 ENCOUNTER — Other Ambulatory Visit (HOSPITAL_BASED_OUTPATIENT_CLINIC_OR_DEPARTMENT_OTHER): Payer: Self-pay | Admitting: Neurological Surgery

## 2023-05-19 ENCOUNTER — Ambulatory Visit: Payer: Medicare Other | Admitting: Internal Medicine

## 2023-05-19 VITALS — BP 172/81 | HR 60 | Ht 64.5 in | Wt 130.0 lb

## 2023-05-19 DIAGNOSIS — J302 Other seasonal allergic rhinitis: Secondary | ICD-10-CM

## 2023-05-19 DIAGNOSIS — J45909 Unspecified asthma, uncomplicated: Secondary | ICD-10-CM

## 2023-05-19 DIAGNOSIS — J4520 Mild intermittent asthma, uncomplicated: Secondary | ICD-10-CM | POA: Diagnosis not present

## 2023-05-19 DIAGNOSIS — Z23 Encounter for immunization: Secondary | ICD-10-CM

## 2023-05-19 DIAGNOSIS — M7912 Myalgia of auxiliary muscles, head and neck: Secondary | ICD-10-CM

## 2023-05-19 NOTE — Patient Instructions (Signed)
Order- Flu vax senior  Order- Prevnar 20   Try Nyquil for occasional help with night-time postnasal drip and sleep

## 2023-05-22 ENCOUNTER — Ambulatory Visit (HOSPITAL_BASED_OUTPATIENT_CLINIC_OR_DEPARTMENT_OTHER)
Admission: RE | Admit: 2023-05-22 | Discharge: 2023-05-22 | Disposition: A | Discharge status: Home / Self Care | Payer: Medicare Other | Source: Ambulatory Visit | Attending: Neurological Surgery | Admitting: Neurological Surgery

## 2023-05-22 DIAGNOSIS — M7912 Myalgia of auxiliary muscles, head and neck: Secondary | ICD-10-CM | POA: Diagnosis present

## 2023-06-07 ENCOUNTER — Encounter (HOSPITAL_BASED_OUTPATIENT_CLINIC_OR_DEPARTMENT_OTHER): Payer: Self-pay

## 2023-06-07 ENCOUNTER — Emergency Department (HOSPITAL_BASED_OUTPATIENT_CLINIC_OR_DEPARTMENT_OTHER): Payer: Medicare Other

## 2023-06-07 ENCOUNTER — Encounter: Payer: Self-pay | Admitting: Internal Medicine

## 2023-06-07 ENCOUNTER — Emergency Department (HOSPITAL_BASED_OUTPATIENT_CLINIC_OR_DEPARTMENT_OTHER): Payer: Medicare Other | Admitting: Radiology

## 2023-06-07 ENCOUNTER — Emergency Department (HOSPITAL_BASED_OUTPATIENT_CLINIC_OR_DEPARTMENT_OTHER)
Admission: EM | Admit: 2023-06-07 | Discharge: 2023-06-07 | Disposition: A | Discharge status: Home / Self Care | Payer: Medicare Other | Attending: Emergency Medicine | Admitting: Emergency Medicine

## 2023-06-07 DIAGNOSIS — Z1152 Encounter for screening for COVID-19: Secondary | ICD-10-CM | POA: Diagnosis not present

## 2023-06-07 DIAGNOSIS — R0602 Shortness of breath: Secondary | ICD-10-CM | POA: Insufficient documentation

## 2023-06-07 DIAGNOSIS — S22078A Other fracture of T9-T10 vertebra, initial encounter for closed fracture: Secondary | ICD-10-CM | POA: Insufficient documentation

## 2023-06-07 DIAGNOSIS — X58XXXA Exposure to other specified factors, initial encounter: Secondary | ICD-10-CM | POA: Diagnosis not present

## 2023-06-07 DIAGNOSIS — N189 Chronic kidney disease, unspecified: Secondary | ICD-10-CM | POA: Diagnosis not present

## 2023-06-07 DIAGNOSIS — S22088A Other fracture of T11-T12 vertebra, initial encounter for closed fracture: Secondary | ICD-10-CM | POA: Insufficient documentation

## 2023-06-07 DIAGNOSIS — Z91040 Latex allergy status: Secondary | ICD-10-CM | POA: Insufficient documentation

## 2023-06-07 DIAGNOSIS — S22068A Other fracture of T7-T8 thoracic vertebra, initial encounter for closed fracture: Secondary | ICD-10-CM | POA: Insufficient documentation

## 2023-06-07 DIAGNOSIS — Z7982 Long term (current) use of aspirin: Secondary | ICD-10-CM | POA: Insufficient documentation

## 2023-06-07 DIAGNOSIS — I2510 Atherosclerotic heart disease of native coronary artery without angina pectoris: Secondary | ICD-10-CM | POA: Diagnosis not present

## 2023-06-07 DIAGNOSIS — R079 Chest pain, unspecified: Secondary | ICD-10-CM | POA: Diagnosis present

## 2023-06-07 DIAGNOSIS — S22009A Unspecified fracture of unspecified thoracic vertebra, initial encounter for closed fracture: Secondary | ICD-10-CM

## 2023-06-07 LAB — TROPONIN I (HIGH SENSITIVITY)
Troponin I (High Sensitivity): 6 ng/L (ref <18)
Troponin I (High Sensitivity): 5 ng/L (ref <18)

## 2023-06-07 LAB — CBC
HCT: 37 % (ref 36.0–46.0)
Hemoglobin: 12.5 g/dL (ref 12.0–15.0)
MCH: 30.9 pg (ref 26.0–34.0)
MCHC: 33.8 g/dL (ref 30.0–36.0)
MCV: 91.6 fL (ref 80.0–100.0)
Platelets: 235 10*3/uL (ref 150–400)
RBC: 4.04 MIL/uL (ref 3.87–5.11)
RDW: 13.7 % (ref 11.5–15.5)
WBC: 5.3 10*3/uL (ref 4.0–10.5)
nRBC: 0 % (ref 0.0–0.2)

## 2023-06-07 LAB — RESP PANEL BY RT-PCR (RSV, FLU A&B, COVID)  RVPGX2
Influenza A by PCR: NEGATIVE
Influenza B by PCR: NEGATIVE
Resp Syncytial Virus by PCR: NEGATIVE
SARS Coronavirus 2 by RT PCR: NEGATIVE

## 2023-06-07 LAB — BASIC METABOLIC PANEL
Anion gap: 7 (ref 5–15)
BUN: 26 mg/dL — ABNORMAL HIGH (ref 8–23)
CO2: 24 mmol/L (ref 22–32)
Calcium: 9.5 mg/dL (ref 8.9–10.3)
Chloride: 105 mmol/L (ref 98–111)
Creatinine, Ser: 0.77 mg/dL (ref 0.44–1.00)
GFR, Estimated: >60 mL/min (ref >60)
Glucose, Bld: 94 mg/dL (ref 70–99)
Potassium: 4 mmol/L (ref 3.5–5.1)
Sodium: 136 mmol/L (ref 135–145)

## 2023-06-07 MED ORDER — OXYCODONE HCL 5 MG PO TABS: 5.0000 mg | ORAL_TABLET | Freq: Four times a day (QID) | ORAL | 0 refills | Status: AC | PRN

## 2023-06-07 MED ORDER — IOHEXOL 350 MG/ML SOLN
75.0000 mL | Freq: Once | INTRAVENOUS | Status: AC | PRN
  Administered 2023-06-07: 75 mL via INTRAVENOUS

## 2023-06-07 NOTE — ED Provider Notes (Signed)
[place] EMERGENCY DEPARTMENT AT [facility] Provider Note   CSN: [id number] Arrival date & time: 06/07/23  4098     History  Chief Complaint  Patient presents with   Chest Pain    Donna Bailey is a 77 y.o. female.   Chest Pain Associated symptoms: cough and shortness of breath      77 year old female with extensive past medical history to include CAD/STEMI with stent placement, GERD, CKD, fibromyalgia, nephrolithiasis, PAD, hiatal hernia, cerebral aneurysm, presenting to the emergency department with chest pain.  The patient states that she has had intermittent pain in her chest for the past week.  It is left-sided and she noticed it when she woke up this morning.  She has a history of STEMI and wanted to make sure she was not having a heart attack.  The patient states that this morning she had shortness of breath, chills, mild nonproductive cough.  Chest pain was a dull sensation, radiating up to her left neck.  No known sick contacts, no fevers.  No lower extremity swelling.   Home Medications Prior to Admission medications   Medication Sig Start Date End Date Taking? Authorizing Provider  oxyCODONE (ROXICODONE) 5 MG immediate release tablet Take 1 tablet (5 mg total) by mouth every 6 (six) hours as needed for severe pain (pain score 7-10). 06/07/23  Yes Ernie Avena, MD  potassium chloride SA (KLOR-CON M) 20 MEQ tablet Take 1 tablet (20 mEq total) by mouth 2 (two) times daily for 5 days. Patient not taking: Reported on 08/22/2022 07/30/22 08/22/22  Esterwood, Amy S, PA-C  albuterol (VENTOLIN HFA) 108 (90 Base) MCG/ACT inhaler Inhale 2 puffs into the lungs every 6 (six) hours as needed for wheezing or shortness of breath. 01/14/23   Cobb, Ruby Cola, NP  aspirin 81 MG chewable tablet Chew 81 mg by mouth daily.    [provider]  bismuth subsalicylate (PEPTO BISMOL) 262 MG chewable tablet Chew 262-524 mg by mouth as needed for indigestion or diarrhea or  loose stools.    [provider]  carvedilol (COREG) 3.125 MG tablet Take 1 tablet (3.125 mg total) by mouth 2 (two) times daily. 06/10/22 06/10/23  Tereso Newcomer T, PA-C  cyanocobalamin (,VITAMIN B-12,) 1000 MCG/ML injection Inject 1,000 mcg into the muscle every Sunday.    [provider]  diphenhydrAMINE (BENADRYL) 25 mg capsule Take 25 mg by mouth daily as needed for sleep or itching.    [provider]  esomeprazole (NEXIUM) 40 MG capsule TAKE 1 CAPSULE BY MOUTH TWICE A DAY 01/06/22   Zehr, Shanda Bumps D, PA-C  famotidine (PEPCID) 40 MG tablet Take 40 mg by mouth daily as needed for heartburn or indigestion.    [provider]  fluticasone (FLONASE) 50 MCG/ACT nasal spray Place 2 sprays into both nostrils daily. 01/14/23   Cobb, Ruby Cola, NP  folic acid (FOLVITE) 1 MG tablet Take 1 tablet (1 mg total) by mouth daily. 08/30/22   Lanae Boast, MD  furosemide (LASIX) 20 MG tablet Take 1 tablet (20 mg total) by mouth daily. 09/01/22   Lanae Boast, MD  Gabapentin 10 % CREA Apply 1-2 application  topically at bedtime. Feet    [provider]  guaiFENesin-codeine 100-10 MG/5ML syrup Take 5 mLs by mouth every 6 (six) hours as needed for cough. 01/14/23   Cobb, Ruby Cola, NP  hydrALAZINE (APRESOLINE) 25 MG tablet Take 1 tablet (25 mg total) by mouth daily as needed (for BP greater than  160 systolic). 09/11/20   Lars Masson, MD  lidocaine (LIDODERM) 5 % Place 0.5-1 patches onto the skin daily. Apply 0.5-1 patch to spine and affected shoulders daily FROM 8 AM TO 8 PM DAILY after bath 09/01/22   Lanae Boast, MD  mirtazapine (REMERON) 7.5 MG tablet Take 1 tablet (7.5 mg total) by mouth at bedtime. 08/29/22   Lanae Boast, MD  Multiple Vitamin (MULTIVITAMIN) LIQD Take 15 mLs by mouth daily. 08/30/22   Lanae Boast, MD  NITRO-BID 2 % ointment Apply 1 g topically See admin instructions. Apply 1 gram daily to the finger tips as directed 10/31/21   [provider]   nitroGLYCERIN (NITROSTAT) 0.4 MG SL tablet Place 1 tablet (0.4 mg total) under the tongue every 5 (five) minutes as needed for chest pain. 11/16/17   Robbie Lis M, PA-C  NON FORMULARY Take 1 tablet by mouth See admin instructions. MagBlue tablets- Take 1 tablet by mouth once a day    [provider]  ondansetron (ZOFRAN-ODT) 4 MG disintegrating tablet DISSOLVE ONE TABLET BY MOUTH EVERY 6 HOURS AS NEEDED FOR NAUSEA AND VOMITING 10/10/22   Esterwood, Amy S, PA-C  oxymetazoline (AFRIN 12 HOUR) 0.05 % nasal spray Place 1 spray into both nostrils 2 (two) times daily. 01/14/23   Cobb, Ruby Cola, NP  sodium chloride (OCEAN) 0.65 % SOLN nasal spray Place 1 spray into both nostrils 2 (two) times daily. 09/01/22   Lanae Boast, MD  ticagrelor (BRILINTA) 90 MG TABS tablet TAKE ONE TABLET BY MOUTH TWICE DAILY 01/30/23   Leonie Douglas, MD      Allergies    Cephalosporins, Crestor [rosuvastatin], Doxycycline, Formoterol, Other, Sulfa antibiotics, Sulfonamide derivatives, Ciprofloxacin, Nitrofurantoin, Penicillins, Prednisone, Amlodipine, Caffeine, Carvedilol, Cephalexin, Dilt-xr [diltiazem hcl], Esomeprazole, Formoterol fumarate, Fosfomycin, Gold sodium thiosulfate, Gold-containing drug products, Hydralazine, Levofloxacin in d5w, Macrodantin [nitrofurantoin macrocrystal], Moxifloxacin, Neomycin, Ofloxacin, Praluent [alirocumab], Pregabalin, Ranexa [ranolazine er], Repatha [evolocumab], Valsartan, Welchol [colesevelam], Zetia [ezetimibe], Zolpidem, Acetaminophen, Fexofenadine, Ibuprofen, Latex, Levofloxacin, Mold extract [trichophyton mentagrophyte], Molds & smuts, Nickel, Tape, and Trichophyton    Review of Systems   Review of Systems  Constitutional:  Positive for chills.  Respiratory:  Positive for cough and shortness of breath.   Cardiovascular:  Positive for chest pain.  All other systems reviewed and are negative.   Physical Exam Updated Vital Signs BP (!) 178/72   Pulse 67   Temp 97.9  F (36.6 C)   Resp 16   SpO2 100%  Physical Exam Vitals and nursing note reviewed.  Constitutional:      General: She is not in acute distress.    Appearance: She is well-developed.  HENT:     Head: Normocephalic and atraumatic.  Eyes:     Conjunctiva/sclera: Conjunctivae normal.  Cardiovascular:     Rate and Rhythm: Normal rate and regular rhythm.     Heart sounds: No murmur heard. Pulmonary:     Effort: Pulmonary effort is normal. No respiratory distress.     Breath sounds: Normal breath sounds.  Abdominal:     Palpations: Abdomen is soft.     Tenderness: There is no abdominal tenderness.  Musculoskeletal:        General: No swelling.     Cervical back: Neck supple.  Skin:    General: Skin is warm and dry.     Capillary Refill: Capillary refill takes less than 2 seconds.  Neurological:     Mental Status: She is alert.     GCS: GCS eye  subscore is 4. GCS verbal subscore is 5. GCS motor subscore is 6.     Cranial Nerves: Cranial nerves 2-12 are intact.     Sensory: Sensation is intact.     Motor: Motor function is intact.  Psychiatric:        Mood and Affect: Mood normal.     ED Results / Procedures / Treatments   Labs (all labs ordered are listed, but only abnormal results are displayed) Labs Reviewed  BASIC METABOLIC PANEL - Abnormal; Notable for the following components:      Result Value   BUN 26 (*)    All other components within normal limits  RESP PANEL BY RT-PCR (RSV, FLU A&B, COVID)  RVPGX2  CBC  TROPONIN I (HIGH SENSITIVITY)  TROPONIN I (HIGH SENSITIVITY)    EKG EKG Interpretation Date/Time:  Sunday June 07 2023 08:43:44 EST Ventricular Rate:  62 PR Interval:  189 QRS Duration:  81 QT Interval:  406 QTC Calculation: 413 R Axis:   53  Text Interpretation: Sinus rhythm No significant change since last tracing Poor baseline lead V2 Confirmed by Ernie Avena ([id number]) on 06/07/2023 8:46:17 AM  Radiology CT Angio Chest PE W and/or Wo  Contrast  Result Date: 06/07/2023 CLINICAL DATA:  Left chest pain EXAM: CT ANGIOGRAPHY CHEST WITH CONTRAST TECHNIQUE: Multidetector CT imaging of the chest was performed using the standard protocol during bolus administration of intravenous contrast. Multiplanar CT image reconstructions and MIPs were obtained to evaluate the vascular anatomy. RADIATION DOSE REDUCTION: This exam was performed according to the departmental dose-optimization program which includes automated exposure control, adjustment of the mA and/or kV according to patient size and/or use of iterative reconstruction technique. CONTRAST:  75mL OMNIPAQUE IOHEXOL 350 MG/ML SOLN COMPARISON:  CTA chest dated Dec 03, 2017 FINDINGS: Cardiovascular: No evidence of pulmonary embolus. Mild cardiomegaly. No normal caliber thoracic aorta with moderate atherosclerotic disease. Severe coronary artery calcifications. No pericardial effusion. Mediastinum/Nodes: Moderate hiatal hernia. Thyroid is unremarkable. Mildly enlarged left hilar lymph node with calcification measuring 11 mm, unchanged when compared with the prior and likely sequela of prior granulomatous infection. Lungs/Pleura: Central airways are patent. No consolidation, pleural effusion or pneumothorax. Right upper lobe calcified nodule. No pleural effusion or pneumothorax. Upper Abdomen: No acute abnormality. Musculoskeletal: Multiple mild compression deformities of T8, T10 T11, T12 and L1 which are new when compared with 2019 prior. T11 compression deformity with kyphoplasty changes. Review of the MIP images confirms the above findings. IMPRESSION: 1. No evidence of pulmonary embolus. 2. Multiple mild compression deformities of the thoracolumbar spine which are new when compared with 2019 prior. Correlate for point tenderness. 3. Moderate hiatal hernia. 4. Aortic Atherosclerosis (ICD10-I70.0). Electronically Signed   By: Allegra Lai M.D.   On: 06/07/2023 12:18   DG Chest 2 View  Result Date:  06/07/2023 CLINICAL DATA:  Chest pain EXAM: CHEST - 2 VIEW COMPARISON:  08/25/2022 FINDINGS: Improved aeration. Focal ill-defined opacity laterally at the left lung base more conspicuous. Chronic calcified granuloma right mid lung. Heart size and mediastinal contours are within normal limits. Aortic Atherosclerosis (ICD10-170.0). No effusion. Interval cement augmentation at approximately T11. T8 compression deformity, increased since 07/25/2022. Chronic compression deformities T12-L3. IMPRESSION: Improved aeration with more conspicuous ill-defined opacity laterally at the left lung base. Electronically Signed   By: Corlis Leak M.D.   On: 06/07/2023 09:24    Procedures Procedures    Medications Ordered in ED Medications  iohexol (OMNIPAQUE) 350 MG/ML injection 75 mL (75 mLs Intravenous  Contrast Given 06/07/23 1143)    ED Course/ Medical Decision Making/ A&P                                 Medical Decision Making Amount and/or Complexity of Data Reviewed Labs: ordered. Radiology: ordered.  Risk Prescription drug management.     77 year old female with extensive past medical history to include CAD/STEMI with stent placement, GERD, CKD, fibromyalgia, nephrolithiasis, PAD, hiatal hernia, cerebral aneurysm, presenting to the emergency department with chest pain.  The patient states that she has had intermittent pain in her chest for the past week.  It is left-sided and she noticed it when she woke up this morning.  She has a history of STEMI and wanted to make sure she was not having a heart attack.  The patient states that this morning she had shortness of breath, chills, mild nonproductive cough.  Chest pain was a dull sensation, radiating up to her left neck.  No known sick contacts, no fevers.  No lower extremity swelling.  On arrival, the patient was afebrile, not tachycardic or tachypneic, saturating well on room air, mildly hypertensive BP 155/64.  Patient presenting with left-sided chest  pain, intermittent pain in her left back, brief episode of shortness of breath, mild nonproductive cough this morning.  Differential diagnose includes ACS, PE, rib fracture, pneumonia, viral infection, pneumothorax.  EKG revealed sinus rhythm, ventricular rate 62, no acute ischemic changes.  A chest x-ray was performed revealed improved aeration, ill-defined opacity laterally at the left lung base.  Discussed further evaluation with CT imaging and the patient would like to proceed with CT imaging to further evaluate.  Laboratory evaluation revealed troponins x 2 negative, CBC without a leukocytosis or anemia, COVID-19 influenza PCR testing negative, BMP unremarkable.  CT imaging revealed the following: FINDINGS:  Cardiovascular: No evidence of pulmonary embolus. Mild cardiomegaly.  No normal caliber thoracic aorta with moderate atherosclerotic  disease. Severe coronary artery calcifications. No pericardial  effusion.    Mediastinum/Nodes: Moderate hiatal hernia. Thyroid is unremarkable.  Mildly enlarged left hilar lymph node with calcification measuring  11 mm, unchanged when compared with the prior and likely sequela of  prior granulomatous infection.    Lungs/Pleura: Central airways are patent. No consolidation, pleural  effusion or pneumothorax. Right upper lobe calcified nodule. No  pleural effusion or pneumothorax.    Upper Abdomen: No acute abnormality.    Musculoskeletal: Multiple mild compression deformities of T8, T10  T11, T12 and L1 which are new when compared with 2019 prior. T11  compression deformity with kyphoplasty changes.    Review of the MIP images confirms the above findings.    IMPRESSION:  1. No evidence of pulmonary embolus.  2. Multiple mild compression deformities of the thoracolumbar spine  which are new when compared with 2019 prior. Correlate for point  tenderness.  3. Moderate hiatal hernia.  4. Aortic Atherosclerosis (ICD10-I70.0).    Patient does  have midline tenderness on repeat exam, however, on exam, the patient was neurologically intact with no focal neurologic deficits, no red flag symptoms to suggest spinal cord compression.  Patient denies any recent falls or trauma.  She has multiple mild compression deformities which were known to her however she is having pain potentially from these compression fractures.  She is status post kyphoplasty and plans to follow-up outpatient with her PCP and neurosurgery.  Will prescribe a course of opiates for short-term pain control.  Patient overall stable for discharge.   Final Clinical Impression(s) / ED Diagnoses Final diagnoses:  Closed fracture of multiple thoracic vertebrae, initial encounter (HCC)  Chest pain, unspecified type    Rx / DC Orders ED Discharge Orders          Ordered    oxyCODONE (ROXICODONE) 5 MG immediate release tablet  Every 6 hours PRN        06/07/23 1251              Ernie Avena, MD 06/07/23 1400

## 2023-06-07 NOTE — Discharge Instructions (Addendum)
Your CT imaging revealed no evidence of pneumonia or rib fracture and your laboratory evaluation pointed away from heart attack with normal cardiac enzymes.  Your COVID and influenza PCR testing is negative.  Your CT imaging did reveal evidence of multiple compression fractures in your thoracic spine which are nontraumatic.  Pain medicine has been prescribed.  Follow-up with your PCP and neurosurgery.  CT Results: FINDINGS:  Cardiovascular: No evidence of pulmonary embolus. Mild cardiomegaly.  No normal caliber thoracic aorta with moderate atherosclerotic  disease. Severe coronary artery calcifications. No pericardial  effusion.    Mediastinum/Nodes: Moderate hiatal hernia. Thyroid is unremarkable.  Mildly enlarged left hilar lymph node with calcification measuring  11 mm, unchanged when compared with the prior and likely sequela of  prior granulomatous infection.    Lungs/Pleura: Central airways are patent. No consolidation, pleural  effusion or pneumothorax. Right upper lobe calcified nodule. No  pleural effusion or pneumothorax.    Upper Abdomen: No acute abnormality.    Musculoskeletal: Multiple mild compression deformities of T8, T10  T11, T12 and L1 which are new when compared with 2019 prior. T11  compression deformity with kyphoplasty changes.    Review of the MIP images confirms the above findings.    IMPRESSION:  1. No evidence of pulmonary embolus.  2. Multiple mild compression deformities of the thoracolumbar spine  which are new when compared with 2019 prior. Correlate for point  tenderness.  3. Moderate hiatal hernia.  4. Aortic Atherosclerosis (ICD10-I70.0).

## 2023-06-07 NOTE — ED Notes (Signed)
Patient transported to CT 

## 2023-06-07 NOTE — Assessment & Plan Note (Signed)
Mild uncomplicated. Hasn't felt need for rescue inhaler yet this Fall Plan- Prevnar 20, Flu vax-senior

## 2023-06-07 NOTE — ED Notes (Signed)

## 2023-06-07 NOTE — ED Triage Notes (Signed)
She c/o persistent, chronic "back pain". She is here today, because she has felt "a few twinges of pain" (points at left upper chest) x 1 week. She denies chest pain at present.

## 2023-06-07 NOTE — Assessment & Plan Note (Signed)
Not noting much seasonal change yet. Discussed use of antihistamines if needed.

## 2023-06-07 NOTE — ED Notes (Signed)
Report received from [place], [facility]

## 2023-06-10 ENCOUNTER — Other Ambulatory Visit (HOSPITAL_BASED_OUTPATIENT_CLINIC_OR_DEPARTMENT_OTHER): Payer: Self-pay | Admitting: Neurological Surgery

## 2023-06-10 DIAGNOSIS — M546 Pain in thoracic spine: Secondary | ICD-10-CM

## 2023-06-16 ENCOUNTER — Ambulatory Visit (HOSPITAL_COMMUNITY)
Admission: RE | Admit: 2023-06-16 | Discharge: 2023-06-16 | Disposition: A | Discharge status: Home / Self Care | Payer: Medicare Other | Source: Ambulatory Visit | Attending: Neurological Surgery | Admitting: Neurological Surgery

## 2023-06-16 DIAGNOSIS — M546 Pain in thoracic spine: Secondary | ICD-10-CM | POA: Diagnosis present

## 2023-07-01 ENCOUNTER — Other Ambulatory Visit (HOSPITAL_BASED_OUTPATIENT_CLINIC_OR_DEPARTMENT_OTHER): Payer: Self-pay | Admitting: Neurological Surgery

## 2023-07-01 DIAGNOSIS — M5412 Radiculopathy, cervical region: Secondary | ICD-10-CM

## 2023-07-03 ENCOUNTER — Ambulatory Visit (HOSPITAL_BASED_OUTPATIENT_CLINIC_OR_DEPARTMENT_OTHER)
Admission: RE | Admit: 2023-07-03 | Discharge: 2023-07-03 | Disposition: A | Discharge status: Home / Self Care | Payer: Medicare Other | Source: Ambulatory Visit | Attending: Neurological Surgery | Admitting: Neurological Surgery

## 2023-07-03 DIAGNOSIS — M2578 Osteophyte, vertebrae: Secondary | ICD-10-CM | POA: Diagnosis not present

## 2023-07-03 DIAGNOSIS — M5412 Radiculopathy, cervical region: Secondary | ICD-10-CM | POA: Diagnosis present

## 2023-07-03 DIAGNOSIS — R609 Edema, unspecified: Secondary | ICD-10-CM | POA: Insufficient documentation

## 2023-07-03 DIAGNOSIS — M4722 Other spondylosis with radiculopathy, cervical region: Secondary | ICD-10-CM | POA: Insufficient documentation

## 2023-07-03 DIAGNOSIS — M50123 Cervical disc disorder at C6-C7 level with radiculopathy: Secondary | ICD-10-CM | POA: Insufficient documentation

## 2023-07-15 ENCOUNTER — Telehealth: Payer: Self-pay | Admitting: Physician Assistant

## 2023-07-15 NOTE — Telephone Encounter (Signed)
Spoke with patient and she stated she saw PCP and he advised to see her cardiologist regarding her plaque build up.   Former [facility] patient. She states she also saw Dr. Rennis Golden for lipid clinic a few years ago.  She would like to continue to see him for her cardiac needs.   Will that be ok. If so please call to schedule.

## 2023-07-15 NOTE — Telephone Encounter (Signed)
Patient stated she has plaque build-up and wants to know if she can be prescribed Leqvio.

## 2023-07-15 NOTE — Telephone Encounter (Signed)
I'm happy to see her back in the office to discuss Leqvio again - we may try to reauthorize it and hopefully there are some options to help with the cost.  I can see her for general cardiology as well.  Dr Rennis Golden

## 2023-07-16 NOTE — Telephone Encounter (Signed)
Left message for patient regarding appointment   Aug 11, 2023 would be optimal day

## 2023-07-21 NOTE — Telephone Encounter (Signed)
Routed to lipid clinic scheduling

## 2023-08-03 ENCOUNTER — Ambulatory Visit: Payer: Medicare Other | Admitting: Podiatry

## 2023-08-11 ENCOUNTER — Other Ambulatory Visit: Payer: Self-pay | Admitting: Internal Medicine

## 2023-08-11 ENCOUNTER — Encounter: Payer: Self-pay | Admitting: Internal Medicine

## 2023-08-11 ENCOUNTER — Ambulatory Visit: Payer: Medicare Other | Attending: Internal Medicine | Admitting: Internal Medicine

## 2023-08-11 ENCOUNTER — Telehealth: Payer: Self-pay | Admitting: Internal Medicine

## 2023-08-11 ENCOUNTER — Telehealth: Payer: Self-pay | Admitting: *Deleted

## 2023-08-11 VITALS — BP 122/56 | HR 69 | Ht 65.0 in | Wt 138.6 lb

## 2023-08-11 DIAGNOSIS — E785 Hyperlipidemia, unspecified: Secondary | ICD-10-CM | POA: Diagnosis not present

## 2023-08-11 DIAGNOSIS — I2510 Atherosclerotic heart disease of native coronary artery without angina pectoris: Secondary | ICD-10-CM

## 2023-08-11 DIAGNOSIS — M791 Myalgia, unspecified site: Secondary | ICD-10-CM

## 2023-08-11 DIAGNOSIS — I739 Peripheral vascular disease, unspecified: Secondary | ICD-10-CM

## 2023-08-11 DIAGNOSIS — T466X5D Adverse effect of antihyperlipidemic and antiarteriosclerotic drugs, subsequent encounter: Secondary | ICD-10-CM

## 2023-08-11 DIAGNOSIS — Z789 Other specified health status: Secondary | ICD-10-CM

## 2023-08-11 NOTE — Patient Instructions (Signed)
 Medication Instructions:  Dr. Mona has recommended an injectable medication called LEQVIO . This is administered by a health care provider. The frequency of injections is TWO injections given 3 months apart (loading dose) and then every 6 months after that. The injection appointments at [facility] ([address] [place], [place]  [address]). Once we have the benefits check information, we will reach out to let you know if the medication is covered 100%, if there is a deductible, co-insurance, out-of-pocket max. From there, we will see if you need patient assistance and our team will take care of working on this. Because of the frequency schedule of this medication, your follow up/repeat cholesterol lab work will be about 5-6 months from now.   *If you need a refill on your cardiac medications before your next appointment, please call your pharmacy*   Lab Work: FASTING lab work to check cholesterol in 5-6 months ** complete about a week before next appointment  If you have labs (blood work) drawn today and your tests are completely normal, you will receive your results only by: MyChart Message (if you have MyChart) OR A paper copy in the mail If you have any lab test that is abnormal or we need to change your treatment, we will call you to review the results.    Follow-Up: At [facility], you and your health needs are our priority.  As part of our continuing mission to provide you with exceptional heart care, we have created designated Provider Care Teams.  These Care Teams include your primary Cardiologist (physician) and Advanced Practice Providers (APPs -  Physician Assistants and Nurse Practitioners) who all work together to provide you with the care you need, when you need it.  We recommend signing up for the patient portal called MyChart.  Sign up information is provided on this After Visit Summary.  MyChart is used to connect with patients for Virtual  Visits (Telemedicine).  Patients are able to view lab/test results, encounter notes, upcoming appointments, etc.  Non-urgent messages can be sent to your provider as well.   To learn more about what you can do with MyChart, go to [email or web address].    Your next appointment:    5-6 months with Dr. Mona

## 2023-08-11 NOTE — Telephone Encounter (Signed)
 Patient called and left a message stating her  cholesterol is elevated and wanted to see if she needed a sooner appt for follow up. Patient is not due for follow up until May 2025. Reviewed patients chart she is being treated for her cholesterol by Dr Mona. Left message for patient not necessary to be seen sooner.
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
## 2023-08-11 NOTE — Telephone Encounter (Signed)
 Patient seen in office today by Dr. Rennis Golden. Leqvio was prescribed. Faced benefits investigation form to [facility] at [phone]

## 2023-08-13 NOTE — Telephone Encounter (Signed)
 Patient called. Advised fasting labs are needed for PA for Leqvio. She said she will go to [facility] lab location for this.

## 2023-08-15 LAB — HEPATIC FUNCTION PANEL
ALT: 12 [IU]/L (ref 0–32)
AST: 20 [IU]/L (ref 0–40)
Albumin: 4 g/dL (ref 3.8–4.8)
Alkaline Phosphatase: 104 [IU]/L (ref 44–121)
Bilirubin Total: 0.3 mg/dL (ref 0.0–1.2)
Bilirubin, Direct: 0.11 mg/dL (ref 0.00–0.40)
Total Protein: 6.7 g/dL (ref 6.0–8.5)

## 2023-08-18 LAB — NMR, LIPOPROFILE
Cholesterol, Total: 222 mg/dL — ABNORMAL HIGH (ref 100–199)
HDL Particle Number: 40.6 umol/L (ref >=30.5)
HDL-C: 50 mg/dL (ref >39)
LDL Particle Number: 1588 nmol/L — ABNORMAL HIGH (ref <1000)
LDL Size: 21.5 nmol (ref >20.5)
LDL-C (NIH Calc): 140 mg/dL — ABNORMAL HIGH (ref 0–99)
LP-IR Score: 53 — ABNORMAL HIGH (ref <=45)
Small LDL Particle Number: 572 nmol/L — ABNORMAL HIGH (ref <=527)
Triglycerides: 181 mg/dL — ABNORMAL HIGH (ref 0–149)

## 2023-08-18 LAB — LIPOPROTEIN A (LPA): Lipoprotein (a): 34.6 nmol/L (ref <75.0)

## 2023-08-21 ENCOUNTER — Other Ambulatory Visit (HOSPITAL_COMMUNITY): Payer: Self-pay

## 2023-08-24 ENCOUNTER — Telehealth: Payer: Self-pay

## 2023-08-24 NOTE — Telephone Encounter (Signed)
Dr. Rennis Golden and Eileen Stanford, patient will be scheduled as soon as possible.  Auth Submission: APPROVED Site of care: Site of care: [facility] Payer: [employer] Medication & CPT/J Code(s) submitted: Leqvio (Inclisiran) [id number] Route of submission (phone, fax, portal): portal Phone # Fax # Auth type: Buy/Bill PB Units/visits requested: 284mg  x 3 doses Reference number: [id number] Approval from: 08/18/23 to 08/17/24   According to the patients BIV she has $77.81 left to meet her deductible. Financial assistance is not needed.

## 2023-09-01 ENCOUNTER — Ambulatory Visit: Payer: Medicare Other

## 2023-09-01 VITALS — BP 135/61 | HR 56 | Temp 97.9°F | Resp 16 | Ht 65.0 in | Wt 147.2 lb

## 2023-09-01 DIAGNOSIS — E785 Hyperlipidemia, unspecified: Secondary | ICD-10-CM

## 2023-09-01 MED ORDER — INCLISIRAN SODIUM 284 MG/1.5ML ~~LOC~~ SOSY
284.0000 mg | PREFILLED_SYRINGE | Freq: Once | SUBCUTANEOUS | Status: AC
  Administered 2023-09-01: 284 mg via SUBCUTANEOUS

## 2023-09-01 NOTE — Progress Notes (Signed)
Diagnosis: Hyperlipidemia  Provider:  Chilton Greathouse MD  Procedure: Injection  Leqvio (inclisiran), Dose: 284 mg, Site: subcutaneous, Number of injections: 1  Injection Site(s): Left upper quad. abdomen  Post Care: Observation period completed  Discharge: Condition: Good, Destination: Home . AVS Provided  Performed by:  Wyvonne Lenz, RN

## 2023-09-01 NOTE — Patient Instructions (Signed)
Inclisiran Injection What is this medication? INCLISIRAN (in kli SIR an) treats high cholesterol. It works by decreasing bad cholesterol (such as LDL) in your blood. Changes to diet and exercise are often combined with this medication. This medicine may be used for other purposes; ask your health care provider or pharmacist if you have questions. COMMON BRAND NAME(S): LEQVIO What should I tell my care team before I take this medication? They need to know if you have any of these conditions: An unusual or allergic reaction to inclisiran, other medications, foods, dyes, or preservatives Pregnant or trying to get pregnant Breast-feeding How should I use this medication? This medication is injected under the skin. It is given by your care team in a hospital or clinic setting. Talk to your care team about the use of this medication in children. Special care may be needed. Overdosage: If you think you have taken too much of this medicine contact a poison control center or emergency room at once. NOTE: This medicine is only for you. Do not share this medicine with others. What if I miss a dose? Keep appointments for follow-up doses. It is important not to miss your dose. Call your care team if you are unable to keep an appointment. What may interact with this medication? Interactions are not expected. This list may not describe all possible interactions. Give your health care provider a list of all the medicines, herbs, non-prescription drugs, or dietary supplements you use. Also tell them if you smoke, drink alcohol, or use illegal drugs. Some items may interact with your medicine. What should I watch for while using this medication? Visit your care team for regular checks on your progress. Tell your care team if your symptoms do not start to get better or if they get worse. You may need blood work while you are taking this medication. What side effects may I notice from receiving this  medication? Side effects that you should report to your care team as soon as possible: Allergic reactions--skin rash, itching, hives, swelling of the face, lips, tongue, or throat Side effects that usually do not require medical attention (report these to your care team if they continue or are bothersome): Joint pain Pain, redness, or irritation at injection site This list may not describe all possible side effects. Call your doctor for medical advice about side effects. You may report side effects to FDA at 1-800-FDA-1088. Where should I keep my medication? This medication is given in a hospital or clinic. It will not be stored at home. NOTE: This sheet is a summary. It may not cover all possible information. If you have questions about this medicine, talk to your doctor, pharmacist, or health care provider.  2024 Elsevier/Gold Standard (2023-07-03 00:00:00)

## 2023-09-08 NOTE — Telephone Encounter (Signed)
First leqvio injection 09/01/23

## 2023-10-08 NOTE — Progress Notes (Signed)
 Cardiology Office Note:    Date:  10/09/2023  ID:  Donna Bailey, DOB Aug 14, 1945, MRN [id number] PCP: Daisy Floro, MD  [facility] Providers Cardiologist:  Chrystie Nose, MD Cardiology APP:  Beatrice Lecher, PA-C       Patient Profile:      Coronary artery disease Anterior STEMI 4/19 s/p 2.75 x 23 mm DES to LAD (c/b radial artery dissection/hematoma)  Cath 6/19: LAD stent patent Myoview 09/2018: low risk Cath 11/2018: LAD stent patent, no change since 6/19 NSTEMI 04/2019 in setting of profound ? BP  >> Cath w/ patent LAD stent  Myoview 11/23/19: EF 61, normal perfusion, low risk  Myoview 09/14/20: EF 66, normal perfusion, low risk  Cath 10/22: Moderate LAD, Dx and RCA dz; patent pLAD stent, mid LAD dz (vasospasm or bridging) borderline by RFR >> Med Rx unless refractory angina  Pt declined taking Dilt due to corn allergy Brilinta DCd 2/2 GI upset >> pt taking in 10/2023 Intol of Coreg  Ischemic CM EF returned to normal [40-45 (4/19) >> 65 (5/19)] Echocardiogram 07/17/21: EF 60-65, no RWMA, GR 1 DD, normal RVSF, mild LAE, trivial MR, AV sclerosis without stenosis  Chronic kidney disease stage 2 Aortic atherosclerosis  Peripheral arterial disease S/p L subclavian artery stent 11/2021 (2/2 embolizing L subcl stenosis) C/b R groin hematoma requiring repair of R CFA [place] 01/07/2023: Patent left subclavian artery stent GERD Barrett's Esophagus Hypertension  Hyperlipidemia  Intol of statins, PCSK9 inhibitors, ezetimibe  Inclisiran - started 08/2023   Asthma  Multiple Med Intolerances/Allergies  Unable to take Plavix, Amlodipine due to allergy to corn filler DC'd Praluent >> shaking DC'd Ranolazine >> Itching DC'd Valsartan >> multiple symptoms Seen by allergist; Food panel w/ corn, red dye negative  Event monitor 01/2019: NSR, sinus tachy, no arrhythmias  Hyponatremia  Vertebral compression Fx         Discussed the use of AI scribe software for clinical note  transcription with the patient, who gave verbal consent to proceed. History of Present Illness Donna Bailey is a 78 y.o. female who returns for follow up of CAD. She was last seen by Dr. Shari Prows in 08/2022. She saw Dr. Rennis Golden in 08/2023 for follow up on Lipids. Plan is to continue follow up with Dr. Rennis Golden as gen cardiologist.   She is here alone. She was seen in the ED in 06/2023 with chest pain. Her hsT was neg. She has felt well since. No chest pain, pressure, shortness of breath, syncope, or leg swelling. She feels tired frequently. She has had several back surgeries. She may need another back surgery in the near future.   ROS-See HPI     Studies Reviewed:       Results LABS - Chart Review Triglycerides: 222 (08/14/2023) HDL: 50 (08/14/2023) LDL: 638 (08/14/2023) Hemoglobin: 12.5 (06/07/2023) Creatinine: 0.77 (06/07/2023) Potassium: 4 (06/07/2023) ALT: 12 (08/14/2023)    Risk Assessment/Calculations:             Physical Exam:   VS:  BP (!) 110/50   Pulse 80   Ht 5\' 5"  (1.651 m)   Wt 143 lb 9.6 oz (65.1 kg)   SpO2 98%   BMI 23.90 kg/m    Wt Readings from Last 3 Encounters:  10/09/23 143 lb 9.6 oz (65.1 kg)  09/01/23 147 lb 3.2 oz (66.8 kg)  08/11/23 138 lb 9.6 oz (62.9 kg)    Constitutional:      Appearance: Healthy appearance. Not  in distress.  Neck:     Vascular: No JVR.  Pulmonary:     Breath sounds: Normal breath sounds. No wheezing. No rales.  Cardiovascular:     Normal rate. Regular rhythm.     Murmurs: There is a grade 2/6 crescendo-decrescendo systolic murmur at the URSB.  Edema:    Peripheral edema absent.  Abdominal:     Palpations: Abdomen is soft.      Assessment and Plan:   Assessment & Plan Coronary artery disease involving native coronary artery of native heart without angina pectoris Status post anterior STEMI in April 2019 treated with DES to the LAD. She has had multiple cardiac catheterizations since. Her last heart catheterization  in October 2022 demonstrated patent LAD stent and mild to moderate non-obstructive disease. Med Rx was continued. Currently asymptomatic w/o chest pain to suggest angina. -Continue Brilinta 90mg  twice daily, nitroglycerin PRN, and Leqvio as directed. PAD (peripheral artery disease) (HCC) Status post left subclavian artery stent in April 2023. Recent ultrasound in June 2024 demonstrated patent left subclavian artery stent. -Continue follow-up with vascular surgery. Essential hypertension Patient unsure if she is currently taking Carvedilol 3.125mg  twice daily for hypertension. Blood pressure is controlled. -If taking Carvedilol, continue 3.125mg  twice daily. Patient to confirm use. Hyperlipidemia LDL goal <70 Multiple medication intolerances noted. Recently started on Leqvio with another infusion scheduled in April. She seems to be tolerating it well.  -Continue Leqvio as directed. Follow-up labs to be determined at May appointment with Dr. Rennis Golden. Murmur Murmur of aortic stenosis noted on exam. Last echocardiogram in December 2022 demonstrated aortic valve sclerosis without stenosis. -Arrange follow-up echocardiogram to reassess. Aortic atherosclerosis (HCC) Intolerant to multiple medications. -Continue Leqvio and Brilinta 90mg  twice daily.      Dispo:  Return in 2 months (on 12/10/2023) for Scheduled Follow Up w/ Dr. Rennis Golden .  Signed, Tereso Newcomer, PA-C

## 2023-10-09 ENCOUNTER — Ambulatory Visit: Payer: Medicare Other | Attending: Physician Assistant | Admitting: Physician Assistant

## 2023-10-09 ENCOUNTER — Encounter: Payer: Self-pay | Admitting: Physician Assistant

## 2023-10-09 VITALS — BP 110/50 | HR 80 | Ht 65.0 in | Wt 143.6 lb

## 2023-10-09 DIAGNOSIS — I2510 Atherosclerotic heart disease of native coronary artery without angina pectoris: Secondary | ICD-10-CM | POA: Diagnosis not present

## 2023-10-09 DIAGNOSIS — R011 Cardiac murmur, unspecified: Secondary | ICD-10-CM

## 2023-10-09 DIAGNOSIS — E785 Hyperlipidemia, unspecified: Secondary | ICD-10-CM | POA: Diagnosis not present

## 2023-10-09 DIAGNOSIS — I739 Peripheral vascular disease, unspecified: Secondary | ICD-10-CM

## 2023-10-09 DIAGNOSIS — I10 Essential (primary) hypertension: Secondary | ICD-10-CM

## 2023-10-09 DIAGNOSIS — I700 Atherosclerosis of aorta: Secondary | ICD-10-CM

## 2023-10-09 NOTE — Assessment & Plan Note (Signed)
 Status post left subclavian artery stent in April 2023. Recent ultrasound in June 2024 demonstrated patent left subclavian artery stent. -Continue follow-up with vascular surgery.

## 2023-10-09 NOTE — Assessment & Plan Note (Signed)
 Patient unsure if she is currently taking Carvedilol 3.125mg  twice daily for hypertension. Blood pressure is controlled. -If taking Carvedilol, continue 3.125mg  twice daily. Patient to confirm use.

## 2023-10-09 NOTE — Assessment & Plan Note (Signed)
 Multiple medication intolerances noted. Recently started on Leqvio with another infusion scheduled in April. She seems to be tolerating it well.  -Continue Leqvio as directed. Follow-up labs to be determined at May appointment with Dr. Rennis Golden.

## 2023-10-09 NOTE — Assessment & Plan Note (Signed)
 Status post anterior STEMI in April 2019 treated with DES to the LAD. She has had multiple cardiac catheterizations since. Her last heart catheterization in October 2022 demonstrated patent LAD stent and mild to moderate non-obstructive disease. Med Rx was continued. Currently asymptomatic w/o chest pain to suggest angina. -Continue Brilinta 90mg  twice daily, nitroglycerin PRN, and Leqvio as directed.

## 2023-10-09 NOTE — Patient Instructions (Signed)
 Medication Instructions:  Your physician recommends that you continue on your current medications as directed. Please refer to the Current Medication list given to you today.  Call us and leet [place] know if you are taking the Coreg or not.  *If you need a refill on your cardiac medications before your next appointment, please call your pharmacy*   Lab Work: None ordered  If you have labs (blood work) drawn today and your tests are completely normal, you will receive your results only by: MyChart Message (if you have MyChart) OR A paper copy in the mail If you have any lab test that is abnormal or we need to change your treatment, we will call you to review the results.   Testing/Procedures: Your physician has requested that you have an echocardiogram. Echocardiography is a painless test that uses sound waves to create images of your heart. It provides your doctor with information about the size and shape of your heart and how well your heart's chambers and valves are working. This procedure takes approximately one hour. There are no restrictions for this procedure. Please do NOT wear cologne, perfume, aftershave, or lotions (deodorant is allowed). Please arrive 15 minutes prior to your appointment time.  Please note: We ask at that you not bring children with you during ultrasound (echo/ vascular) testing. Due to room size and safety concerns, children are not allowed in the ultrasound rooms during exams. Our front office staff cannot provide observation of children in our lobby area while testing is being conducted. An adult accompanying a patient to their appointment will only be allowed in the ultrasound room at the discretion of the ultrasound technician under special circumstances. We apologize for any inconvenience.    Follow-Up: At [facility], you and your health needs are our priority.  As part of our continuing mission to provide you with exceptional heart care, we have  created designated Provider Care Teams.  These Care Teams include your primary Cardiologist (physician) and Advanced Practice Providers (APPs -  Physician Assistants and Nurse Practitioners) who all work together to provide you with the care you need, when you need it.  We recommend signing up for the patient portal called "MyChart".  Sign up information is provided on this After Visit Summary.  MyChart is used to connect with patients for Virtual Visits (Telemedicine).  Patients are able to view lab/test results, encounter notes, upcoming appointments, etc.  Non-urgent messages can be sent to your provider as well.   To learn more about what you can do with MyChart, go to [email or web address].    Your next appointment:   As scheduled   Provider:   Chrystie Nose, MD     Other Instructions    1st Floor: - Lobby - Registration  - Pharmacy  - Lab - Cafe  2nd Floor: - PV Lab - Diagnostic Testing (echo, CT, nuclear med)  3rd Floor: - Vacant  4th Floor: - TCTS (cardiothoracic surgery) - AFib Clinic - Structural Heart Clinic - Vascular Surgery  - Vascular Ultrasound  5th Floor: - [facility]Care Cardiology (general and EP) - Clinical Pharmacy for coumadin, hypertension, lipid, weight-loss medications, and med management appointments    Valet parking services will be available as well.

## 2023-11-17 ENCOUNTER — Other Ambulatory Visit: Payer: Self-pay | Admitting: Internal Medicine

## 2023-11-17 DIAGNOSIS — E785 Hyperlipidemia, unspecified: Secondary | ICD-10-CM

## 2023-11-18 ENCOUNTER — Other Ambulatory Visit (HOSPITAL_BASED_OUTPATIENT_CLINIC_OR_DEPARTMENT_OTHER): Payer: Self-pay | Admitting: Physician Assistant

## 2023-11-18 DIAGNOSIS — M5416 Radiculopathy, lumbar region: Secondary | ICD-10-CM

## 2023-11-27 ENCOUNTER — Ambulatory Visit (HOSPITAL_BASED_OUTPATIENT_CLINIC_OR_DEPARTMENT_OTHER)
Admission: RE | Admit: 2023-11-27 | Discharge: 2023-11-27 | Disposition: A | Discharge status: Home / Self Care | Source: Ambulatory Visit | Attending: Physician Assistant | Admitting: Physician Assistant

## 2023-11-27 DIAGNOSIS — M5416 Radiculopathy, lumbar region: Secondary | ICD-10-CM | POA: Insufficient documentation

## 2023-11-28 LAB — NMR, LIPOPROFILE
Cholesterol, Total: 212 mg/dL — ABNORMAL HIGH (ref 100–199)
HDL Particle Number: 41.2 umol/L (ref >=30.5)
HDL-C: 59 mg/dL (ref >39)
LDL Particle Number: 1295 nmol/L — ABNORMAL HIGH (ref <1000)
LDL Size: 21.2 nm (ref >20.5)
LDL-C (NIH Calc): 131 mg/dL — ABNORMAL HIGH (ref 0–99)
LP-IR Score: 45 (ref <=45)
Small LDL Particle Number: 557 nmol/L — ABNORMAL HIGH (ref <=527)
Triglycerides: 125 mg/dL (ref 0–149)

## 2023-12-01 ENCOUNTER — Ambulatory Visit (INDEPENDENT_AMBULATORY_CARE_PROVIDER_SITE_OTHER): Payer: Medicare Other

## 2023-12-01 ENCOUNTER — Ambulatory Visit (HOSPITAL_COMMUNITY): Attending: Internal Medicine

## 2023-12-01 VITALS — BP 121/73 | HR 88 | Temp 98.3°F | Resp 16 | Ht 65.0 in | Wt 150.8 lb

## 2023-12-01 DIAGNOSIS — R011 Cardiac murmur, unspecified: Secondary | ICD-10-CM | POA: Insufficient documentation

## 2023-12-01 DIAGNOSIS — I080 Rheumatic disorders of both mitral and aortic valves: Secondary | ICD-10-CM

## 2023-12-01 DIAGNOSIS — I5030 Unspecified diastolic (congestive) heart failure: Secondary | ICD-10-CM

## 2023-12-01 DIAGNOSIS — E785 Hyperlipidemia, unspecified: Secondary | ICD-10-CM | POA: Diagnosis not present

## 2023-12-01 DIAGNOSIS — I2510 Atherosclerotic heart disease of native coronary artery without angina pectoris: Secondary | ICD-10-CM | POA: Diagnosis present

## 2023-12-01 MED ORDER — INCLISIRAN SODIUM 284 MG/1.5ML ~~LOC~~ SOSY
284.0000 mg | PREFILLED_SYRINGE | Freq: Once | SUBCUTANEOUS | Status: AC
  Administered 2023-12-01: 284 mg via SUBCUTANEOUS
  Filled 2023-12-01: qty 1.5

## 2023-12-01 NOTE — Progress Notes (Signed)
 Diagnosis: Hyperlipidemia  Provider:  Azalee Lenz PA  Procedure: Injection  Leqvio  (inclisiran), Dose: 284 mg, Site: subcutaneous, Number of injections: 1  Injection Site(s): Left lower quad. abdomen  Post Care: Patient declined observation  Discharge: Condition: Good, Destination: Home . AVS Provided  Performed by:  Natividad Balding, RN

## 2023-12-03 LAB — ECHOCARDIOGRAM COMPLETE
AR max vel: 1.36 cm2
AV Area VTI: 1.44 cm2
AV Area mean vel: 1.3 cm2
AV Mean grad: 7.7 mmHg
AV Peak grad: 14 mmHg
Ao pk vel: 1.87 m/s
Area-P 1/2: 3.97 cm2
Height: 65 in
S' Lateral: 2.4 cm
Weight: 2412.8 [oz_av]

## 2023-12-04 ENCOUNTER — Encounter: Payer: Self-pay | Admitting: Physician Assistant

## 2023-12-04 DIAGNOSIS — I358 Other nonrheumatic aortic valve disorders: Secondary | ICD-10-CM | POA: Insufficient documentation

## 2023-12-10 ENCOUNTER — Ambulatory Visit: Payer: Medicare Other | Admitting: Internal Medicine

## 2023-12-15 ENCOUNTER — Ambulatory Visit: Admitting: Internal Medicine

## 2023-12-15 ENCOUNTER — Encounter: Payer: Self-pay | Admitting: Internal Medicine

## 2023-12-15 ENCOUNTER — Ambulatory Visit (INDEPENDENT_AMBULATORY_CARE_PROVIDER_SITE_OTHER)

## 2023-12-15 ENCOUNTER — Ambulatory Visit: Payer: Self-pay | Admitting: Internal Medicine

## 2023-12-15 VITALS — BP 114/70 | HR 90 | Temp 98.8°F | Ht 65.0 in | Wt 147.6 lb

## 2023-12-15 DIAGNOSIS — U071 COVID-19: Secondary | ICD-10-CM

## 2023-12-15 DIAGNOSIS — R051 Acute cough: Secondary | ICD-10-CM | POA: Diagnosis not present

## 2023-12-15 DIAGNOSIS — J45909 Unspecified asthma, uncomplicated: Secondary | ICD-10-CM | POA: Diagnosis not present

## 2023-12-15 MED ORDER — METHYLPREDNISOLONE ACETATE 80 MG/ML IJ SUSP
120.0000 mg | Freq: Once | INTRAMUSCULAR | Status: AC
Start: 2023-12-15 — End: 2023-12-15
  Administered 2023-12-15: 120 mg via INTRAMUSCULAR

## 2023-12-15 MED ORDER — GUAIFENESIN-CODEINE 100-10 MG/5ML PO SOLN: 5.0000 mL | Freq: Four times a day (QID) | ORAL | 0 refills | Status: DC | PRN

## 2023-12-15 NOTE — Telephone Encounter (Signed)
 Chief Complaint: cough Symptoms: dry cough, "chest rattling", nasal drainage Frequency: x few days Pertinent Negatives: Patient denies fever, CP, SOB Disposition: [] ED /[] Urgent Care (no appt availability in office) / [x] Appointment(In office/virtual)/ []  [facility]/ [] Home Care/ [] Refused Recommended Disposition /[]  Mobile Bus/ []  Follow-up with PCP Additional Notes: Pt c/o cough, nasal drainage and "chest rattling" x past few days. Pt denies any SOB, CP and fevers. Pt reports taking OTC with no relief and does not take any maintenance INH. Scheduled patient per protocol on Dec 15, 2023. Patient verbalized understanding and to call back with worsening symptoms.        Copied from CRM [id number]. Topic: Clinical - Red Word Triage >> Dec 15, 2023  8:01 AM Margarette Shawl wrote: Red Word that prompted transfer to Nurse Triage:   Symptoms started over the weekend Hacking cough, occurs all day Had nosebleed this morning from frequently blowing nose Chest rattling last night  Unable to cough phlegm Vomited 2 times, last time this morning Sinus headache Intermittent feeling of being warm Reason for Disposition  Earache  Answer Assessment - Initial Assessment Questions E2C2 Pulmonary Triage - Initial Assessment Questions "Chief Complaint (e.g., cough, sob, wheezing, fever, chills, sweat or additional symptoms) *Go to specific symptom protocol after initial questions. Dry cough, sinus headache, post tussive emesis x 2 Chest rattling  "How long have symptoms been present?" Since weekend  Have you tested for COVID or Flu? Note: If not, ask patient if a home test can be taken. If so, instruct patient to call back for positive results. No  MEDICINES:   "Have you used any OTC meds to help with symptoms?" Yes If yes, ask "What medications?" "Cough medicine" - no relief  "Have you used your inhalers/maintenance medication?" No If yes, "What medications?" Reports "not  using anymore"  If inhaler, ask "How many puffs and how often?" Note: Review instructions on medication in the chart. N/a  OXYGEN: "Do you wear supplemental oxygen?" No If yes, "How many liters are you supposed to use?" N/a  "Do you monitor your oxygen levels?" No If yes, "What is your reading (oxygen level) today?" N/a     1. LOCATION: "Where does it hurt?"      Sinus H/a 2. ONSET: "When did the sinus pain start?"  (e.g., hours, days)      Over the weekend 3. SEVERITY: "How bad is the pain?"   (Scale 1-10; mild, moderate or severe)   - MILD (1-3): doesn't interfere with normal activities    - MODERATE (4-7): interferes with normal activities (e.g., work or school) or awakens from sleep   - SEVERE (8-10): excruciating pain and patient unable to do any normal activities        "Mostly drainage from my sinuses" 4. RECURRENT SYMPTOM: "Have you ever had sinus problems before?" If Yes, ask: "When was the last time?" and "What happened that time?"      Yes, hx PNA years ago and tx with abx 5. NASAL CONGESTION: "Is the nose blocked?" If Yes, ask: "Can you open it or must you breathe through your mouth?"     no 6. NASAL DISCHARGE: "Do you have discharge from your nose?" If so ask, "What color?"     Clear Endorses "clot of blood" coming out of nose after blowing - denies active bleeding 7. FEVER: "Do you have a fever?" If Yes, ask: "What is it, how was it measured, and when did it start?"  No, endorses "feeling warm" 8. OTHER SYMPTOMS: "Do you have any other symptoms?" (e.g., sore throat, cough, earache, difficulty breathing)     Earache, cough  Protocols used: Sinus Pain or Congestion-A-AH

## 2023-12-15 NOTE — Progress Notes (Signed)
 Donna Bailey, female    DOB: 05/08/1946   MRN: [id number]   Brief patient profile:  42  yowm  never smoker pt of Dr Linder Revere with allergic rhinitis but minimal hx of asthma   referred to pulmonary clinic 12/15/2023 by phone triage service  for "dry cough/ sense of nasal drainage x 12/10/23  no better with otcs        History of Present Illness  12/15/2023  Pulmonary/ 1st office eval/Donna Bailey  46 allergies/ zpak 12/13/23 over the phone  Chief Complaint  Patient presents with   Acute Visit    Started feeling bad 5/8, over the weekend started having trouble sleeping d/t congestion in sinuses and ears.  Last night started wheezing.  Nose was running this am like a faucet.  Some blood this am when she blew her nose.  She also has a dry cough that started on 5/11.  Dyspnea:  tired > short of breath Cough: severe to point of gag/vomit SABA use: none   No obvious day to day or daytime pattern/variability or assoc excess/ purulent sputum or mucus plugs or hemoptysis or cp or chest tightness,   or overt sinus or hb symptoms.    Also denies any obvious fluctuation of symptoms with weather or environmental changes or other aggravating or alleviating factors except as outlined above   No unusual exposure hx or h/o childhood pna/ asthma or knowledge of premature birth.  Current Allergies, Complete Past Medical History, Past Surgical History, Family History, and Social History were reviewed in [employer] record.  ROS  The following are not active complaints unless bolded Hoarseness, sore throat, dysphagia, dental problems, itching, sneezing,  nasal congestion or discharge of excess mucus or purulent secretions, ear ache,   fever, chills, sweats, unintended wt loss or wt gain, classically pleuritic or exertional cp,  orthopnea pnd or arm/hand swelling  or leg swelling, presyncope, palpitations, abdominal pain, anorexia, nausea, vomiting, diarrhea  or change in bowel habits or change in  bladder habits, change in stools or change in urine, dysuria, hematuria,  rash, arthralgias, visual complaints, headache, numbness, weakness or ataxia or problems with walking or coordination,  change in mood or  memory.          Outpatient Medications Prior to Visit  Medication Sig Dispense Refill   cyanocobalamin  (,VITAMIN B-12,) 1000 MCG/ML injection Inject 1,000 mcg into the muscle every Sunday.     esomeprazole  (NEXIUM ) 40 MG capsule TAKE 1 CAPSULE BY MOUTH TWICE A DAY 180 capsule 1   furosemide  (LASIX ) 20 MG tablet Take 1 tablet (20 mg total) by mouth daily.     Gabapentin  10 % CREA Apply 1-2 application  topically at bedtime. Feet     guaiFENesin -codeine  100-10 MG/5ML syrup Take 5 mLs by mouth every 6 (six) hours as needed for cough. 180 mL 0   inclisiran (LEQVIO ) 284 MG/1.5ML SOSY injection Inject 284 mg into the skin once.     lidocaine  (LIDODERM ) 5 % Place 0.5-1 patches onto the skin daily. Apply 0.5-1 patch to spine and affected shoulders daily FROM 8 AM TO 8 PM DAILY after bath 30 patch 0   NITRO-BID  2 % ointment Apply 1 g topically See admin instructions. Apply 1 gram daily to the finger tips as directed     nitroGLYCERIN  (NITROSTAT ) 0.4 MG SL tablet Place 1 tablet (0.4 mg total) under the tongue every 5 (five) minutes as needed for chest pain. 25 tablet 2   oxyCODONE  (ROXICODONE )  5 MG immediate release tablet Take 1 tablet (5 mg total) by mouth every 6 (six) hours as needed for severe pain (pain score 7-10). 12 tablet 0   ticagrelor  (BRILINTA ) 90 MG TABS tablet TAKE ONE TABLET BY MOUTH TWICE DAILY 60 tablet 11   tiZANidine (ZANAFLEX) 2 MG tablet Take 2 mg by mouth 3 (three) times daily.     zolpidem  (AMBIEN ) 10 MG tablet Take 10 mg by mouth at bedtime as needed.     carvedilol  (COREG ) 3.125 MG tablet Take 1 tablet (3.125 mg total) by mouth 2 (two) times daily. 180 tablet 3   No facility-administered medications prior to visit.    Past Medical History:  Diagnosis Date    Allergic rhinitis, cause unspecified    Anemia    Aneurysm of right conjunctiva    right eye    Anxiety    Asthma    Barrett's esophagus    CAD in native artery    a. 11/2017: STEMI s/p DES to prox-mid LAD; LCx stenosis managed medically. Case complicated by R forearm hematoma. b. Several subsequent caths, last in 04/2019 with stable disease // Myoview  11/2019: EF 61, normal perfusion; low risk    CKD (chronic kidney disease), stage II    Constipation    Cystocele    Deviated nasal septum    Diaphragmatic hernia without mention of obstruction or gangrene    Esophageal reflux    Fibromyalgia    H/O hiatal hernia    Hypertension    Insomnia, unspecified    Ischemic cardiomyopathy    a. EF 40-45% by echo 11/2017. // Echo 5/19: No new wall motion abnormalities, EF 65, no pericardial effusion, normal aortic root   Kidney stones    hx of pt see Dr. Bosie Bye   Medication intolerance    numerous   Migraine    Myalgia and myositis, unspecified    Myocardial infarct (HCC) 11/13/2017   Myocardial infarction [facility])    Nuclear stress tests    Cardiolite 2/18: no ischemia or scar, EF 78; Low Risk   Osteoarthrosis, unspecified whether generalized or localized, unspecified site    Pancreatitis    Peripheral neuropathy    Pneumonia    hx of   Pure hypercholesterolemia    Rectocele    Scoliosis (and kyphoscoliosis), idiopathic    Statin intolerance    Temporomandibular joint disorders, unspecified    Upper respiratory infection, acute 04/15/2018   Urticaria    Wheat allergy       Objective:     BP 114/70 (BP Location: Right Arm, Patient Position: Sitting, Cuff Size: Normal)   Pulse 90   Temp 98.8 F (37.1 C) (Oral)   Ht 5\' 5"  (1.651 m)   Wt 147 lb 9.6 oz (67 kg)   SpO2 96%   BMI 24.56 kg/m   SpO2: 96 %  w/c bound amb wf with harsh coughing fits but no sputum production   HEENT : Oropharynx  clear - no obvious pnd or cobblestoning        NECK :  without  apparent JVD/ palpable  Nodes/TM    LUNGS: no acc muscle use,  mod kyphotic  contour chest which is clear to A and P bilaterally without cough on insp or exp maneuvers   CV:  RRR  no s3 or murmur or increase in P2, and no edema   ABD:  soft and nontender   MS:   ext warm without deformities Or obvious joint restrictions  calf tenderness, cyanosis or clubbing    SKIN: warm and dry without lesions    NEURO:  alert, approp, nl sensorium with  no motor or cerebellar deficits apparent.    CXR PA and Lateral:   12/15/2023 :    I personally reviewed images and impression is as follows:   Mod kyphosis/ no as dz        Assessment   No problem-specific Assessment & Plan notes found for this encounter.     Vernestine Gondola, MD 12/15/2023

## 2023-12-15 NOTE — Telephone Encounter (Signed)
 FYI pt has appt with you 12/15/2023 at 1:00 PM

## 2023-12-15 NOTE — Assessment & Plan Note (Signed)
 Onset May 8 assoc with rhinitis flare no fever and clear lungs on exam and cxr - Rx with Zpak empirically by PCP over the phone since 12/13/23 >>>  Cough seems out of proportion to findings with differential diagnosis:  almost all start with A and  include Adherence, Ace Inhibitors, Acid Reflux, Active Sinus Disease, Alpha 1 Antitripsin deficiency, Anxiety/depreession masquerading as Airways dz,  ABPA,  Allergy(esp in young), Aspiration (esp in elderly), Adverse effects of meds,  Active smoking or vaping, A bunch of PE's (a small clot burden can't cause this syndrome unless there is already severe underlying pulm or vascular dz with poor reserve) plus two Bs  = Bronchiectasis and Beta blocker use..and one C= CHF   Most likely causes are bolded  Adherence is always the initial "prime suspect" and is a multilayered concern that requires a "trust but verify" approach in every patient - starting with knowing how to use medications, especially inhalers, correctly, keeping up with refills and understanding the fundamental difference between maintenance and prns vs those medications only taken for a very short course and then stopped and not refilled.  - with all meds in hand using a trust but verify approach to confirm accurate Medication  Reconciliation The principal here is that until we are certain that the  patients are doing what we've asked, it makes no sense to ask them to do more.   ? Acid (or non-acid) GERD > always difficult to exclude as up to 75% of pts in some series report no assoc GI/ Heartburn symptoms> rec max (24h)  acid suppression and diet restrictions/ reviewed and instructions given in writing.   Active sinus dz/ rhinitis > due to multiple allergies would need conving dx of infection to rx with more than zpak  ? Anxiety/depression > usually at the bottom of this list of usual suspects but  may interfere with adherence and also interpretation of response or lack thereof to symptom  management which can be quite subjective.   ? Allergy /asthma> depomedrol 120 mg IM now but no further rx for now   ? Beta blockers > unlikely with such small dose of BB         Each maintenance medication was reviewed in detail including emphasizing most importantly the difference between maintenance and prns and under what circumstances the prns are to be triggered using an action plan format where appropriate.  Total time for H and P, chart review, counseling,    and generating customized AVS unique to this office visit / same day charting = 40 min for acute ov in  pt new to me  with  refractory respiratory  symptoms of uncertain etiology

## 2023-12-15 NOTE — Patient Instructions (Addendum)
 Nexium  40 mg Take 30- 60 min before your first and last meals of the day   For cough/ congestion > guaifenisin  with codeine  1 tsp every 4-6  hours as needed   Depomedrol 120 mg IM   Please remember to go to the  x-ray department  for your tests - we will call you with the results when they are available    If condition gets worse ok to go to ER

## 2023-12-16 ENCOUNTER — Ambulatory Visit: Payer: Self-pay | Admitting: Internal Medicine

## 2023-12-16 ENCOUNTER — Telehealth: Payer: Self-pay

## 2023-12-16 NOTE — Telephone Encounter (Signed)
 This RN made third attempt to contact patient with no answer. A voicemail was left with call back number provided.

## 2023-12-16 NOTE — Telephone Encounter (Signed)
 Copied from CRM [id number]. Topic: Appointments - Scheduling Inquiry for Clinic >> Dec 16, 2023 12:00 PM Dustin F wrote: Reason for CRM: Pt is having green mucus, threw up a couple times, and has a cough. Pt believes it is the beginning of pneumonia or bronchitis and wants to be seen either tomorrow, Friday, or beginning of next week. I attempted to schedule an appt, but she wants to specifically see Dr. Linder Revere and not another provider. I let her know her specifically sees sleep patients and not pulm, but she insists. Please call the pt back at [phone]    Called patient.  States she has very severe allergies and is now coughing and spitting up green mucus. Sinuses and ears are congested.  Runny nose.  No wheezing.  No fever.  Patient saw Dr. Waymond Hailey yesterday and a CXR was done.  Patient states she had a cortisone injection yesterday in clinic but the mucus is a darker green today.  Patient states Dr. Linder Revere knows what kind of abt to put her on as she must stay on top of her sx due to her severe environmental allergies or she will get a bad episode of bronchitis or pneumonia.  Patient is very allergic to mold so she cannot take PCN family abt medications.  Patient states this is now affecting her sleep.  Patient would like to see Dr. Linder Revere ASAP or have a medication called into pharmacy,    Dr. Linder Revere, please advise.  Allergies  Allergen Reactions   Cephalosporins Itching and Other (See Comments)    Tolerated cefdinir  in 2019  Other Reaction(s): Other (See Comments)  unknown   Crestor [Rosuvastatin] Other (See Comments)    Lost all muscle mobility    Doxycycline  Diarrhea and Nausea And Vomiting   Formoterol Other (See Comments)    Reaction not recalled   Other Anaphylaxis, Itching, Rash and Other (See Comments)    Polyester-"pins sticking in her skin    Sulfa Antibiotics Anaphylaxis   Sulfonamide Derivatives Anaphylaxis   Ciprofloxacin Other (See Comments) and Rash    Other Reaction(s):  Other (See Comments)  unknown, unknown   Nitrofurantoin Diarrhea, Nausea And Vomiting and Other (See Comments)    Also, "convulsions/constant shaking"- per patient   Penicillins Rash    Underarms (both) Has patient had a PCN reaction causing immediate rash, facial/tongue/throat swelling, SOB or lightheadedness with hypotension: YES Has patient had a PCN reaction causing severe rash involving mucus membranes or skin necrosis: NO Has patient had a PCN reaction that required hospitalization NO Has patient had a PCN reaction occurring within the last 10 years: NO If all of the above answers are "NO", then may proceed with Cephalosporin use. Other reaction(s): Other (See Comments) Underarms (both) Other Reaction: "red hot skin" Underarms (both) Has patient had a PCN reaction causing immediate rash, facial/tongue/throat swelling, SOB or lightheadedness with hypotension: YES Has patient had a PCN reaction causing severe rash involving mucus membranes or skin necrosis: NO Has patient had a PCN reaction that required hospitalization NO Has patient had a PCN reaction occurring within the last 10 years: NO If all of the above answers are "NO", then may proceed with Cephalosporin use.   Prednisone  Itching    Pt states she cannot take prednisone  with corn filler 05/19/2019.   Amlodipine  Swelling and Other (See Comments)    Swelling in legs   Caffeine Other (See Comments)    Heart races  Other Reaction(s): Other (See Comments)  Heart race, Heart race  Carvedilol      dry mouth   Cephalexin Other (See Comments)    Reaction not recalled by the patient   Dilt-Xr [Diltiazem  Hcl]     LE edema   Esomeprazole  Other (See Comments)    Reaction not cited   Formoterol Fumarate Other (See Comments)    Reaction not recalled   Fosfomycin     Nerve pain in her legs   Gold Sodium Thiosulfate Other (See Comments)    Positive patch test   Gold-Containing Drug Products Other (See Comments)    Skin tingles    Hydralazine     Levofloxacin In D5w Other (See Comments)    Racing heart  Other Reaction(s): Other (See Comments)  Other Reaction: racing heart   Macrodantin [Nitrofurantoin Macrocrystal] Itching   Moxifloxacin Other (See Comments)    Caused hands to "shake"  Other Reaction(s): Other (See Comments)  Doesn't remember, Other Reaction: Shaking hands, Doesn't remember   Neomycin Other (See Comments)    Doesn't remember - allergist said not to use it because it could add more allergies- Positive patch test    Ofloxacin Itching and Other (See Comments)   Praluent  [Alirocumab ] Other (See Comments)    "Shaking"   Pregabalin Other (See Comments)   Ranexa  [Ranolazine  Er]     itching   Repatha [Evolocumab]     shaking   Valsartan  Other (See Comments)    Pt reports this med causes her to feel sluggish and she feels heaviness on her shoulders.   Welchol [Colesevelam] Other (See Comments)    Reaction not cited   Zetia [Ezetimibe]    Zolpidem      Can tolerate in 2024   Acetaminophen  Other (See Comments) and Rash    Facial rash  On face, On face, On face   Fexofenadine Palpitations and Other (See Comments)    Heart races   Ibuprofen Rash   Latex Rash and Other (See Comments)    Skin gets red    Levofloxacin Other (See Comments) and Palpitations    HEART RACING  Other Reaction(s): Other (See Comments)  REACTION: HEART RACING, REACTION: HEART RACING, Other Reaction: racing heart, REACTION: HEART RACING   Mold Extract [Trichophyton Mentagrophyte] Other (See Comments)    Bumps on back, stops up sinuses.   Molds & Smuts Rash and Other (See Comments)    Bumps on back, stops up sinuses.   Nickel Rash   Tape Rash   Trichophyton Rash and Other (See Comments)    Bumps on back, stops up sinuses

## 2023-12-16 NOTE — Telephone Encounter (Signed)
 Copied from CRM [id number]. Topic: Clinical - Red Word Triage >> Dec 16, 2023 10:12 AM Eveleen Hinds B wrote: [facility] that prompted transfer to Nurse Triage:  Congestion, significant cough, sounds that her body shouldn't be making, green sputum  Agent reporting that pt disconnected before transfer to nurse. Nurse made first attempt to contact pt, no answer or ringing, LVM for call back to pulm office. Placed in call back.

## 2023-12-16 NOTE — Telephone Encounter (Signed)
2nd attempt, LVM requesting call back.

## 2023-12-17 NOTE — Telephone Encounter (Signed)
 Duplicate, please see message sent to Dr. Linder Revere on 5/14.  Closing encounter.

## 2023-12-17 NOTE — Telephone Encounter (Signed)
 Per Dr. Linder Revere:  If she still wants to be seen, then ok to use a held spot.   ATC patient, left detailed message (per DPR) letting her know that Dr. Linder Revere could see her tomorrow, 12/18/23 at 11:30 am, she would need to arrive by 11:15 am for check in.  Scheduled for appointment with dr. Linder Revere at 11:30 am.

## 2023-12-18 ENCOUNTER — Ambulatory Visit: Admitting: Internal Medicine

## 2023-12-18 ENCOUNTER — Encounter: Payer: Self-pay | Admitting: Internal Medicine

## 2023-12-18 VITALS — BP 129/75 | HR 66 | Temp 97.5°F | Ht 65.0 in | Wt 149.8 lb

## 2023-12-18 DIAGNOSIS — J40 Bronchitis, not specified as acute or chronic: Secondary | ICD-10-CM | POA: Diagnosis not present

## 2023-12-18 DIAGNOSIS — J45909 Unspecified asthma, uncomplicated: Secondary | ICD-10-CM

## 2023-12-18 NOTE — Progress Notes (Signed)
 HPI female never smoker followed for bronchitis, allergic rhinitis, chronic insomnia, "intolerance of all corn products", complicated by GERD and multiple medical problems, CAD/MI/ stent Seroquel made her too dizzy. Belsomra 15 mg made her muscles stiff and left her groggy but did help her sleep. Rozerem  did not help with sleep but made her dizzy She again requests prescription for zolpidem  10 mg, insisting that nothing else has worked as well to help her manage chronic insomnia. She limits foods with magnesium  which she says make her feel hot She tells me she is allergic to nickel, gold and polyester "all of which conduct electricity". Therefore she wants a letter for her to present to [facility] [employer] asking them to change her electric meter to a "noncommunicating electric meter", so that [employer] will stop sending radio waves through her home. She prefers a manual meter check.  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 Obesity, Hyperlipidemia, Migraine, Hiatal Hernia, GERD/ Barretts, Vertebral Compression Fractures, -zolpidem , Ventolin  hfa, LOV 6/12 Cobb, NP-  Covid infection> Depomedrol and prednisone , saline rinse, Molnupiravir ,  -----ACT score 25  Breathing has been good  Rarely needs her rescue inhaler. Discussed vaccine recommendations. Flu vax senior.  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 Obesity, Hyperlipidemia, Migraine, Hiatal Hernia, GERD/ Barretts, Vertebral Compression Fractures, -zolpidem , Ventolin  hfa, LOV Wert 5/13> nexium  BID, depomedrol, guaifenesis/codeine  cough syrup CXR 12/15/23 IMPRESSION: 1. No acute cardiopulmonary abnormality. ------Still coughing up green mucous, getting  lighter.  Has not taken her temperature, she would get hot and then get cold.  Would not last long. Discussed the use of AI scribe software for clinical note transcription with the patient, who gave verbal consent to proceed.  History of Present Illness   Donna Bailey "Donna Bailey" is a 78 year old female with chronic bronchitis who presents with a persistent cough.  She experiences a persistent cough with green sputum production, which is gradually improving. Symptoms began in May, differing from previous episodes that typically started in April. She was prescribed a Z-Pak, which has been effective in the past without adverse reactions. She has a history of broad intolerance to many med, especially attributed to corn starch "fillers". there is little objective confirmation. It is understood that failure to respond to azithromycin  would likely lead to hospital referral for IV meds, and/ or home therapy with a central line.  Her medical history includes chronic bronchitis, pneumonia, and a lung collapse in the late 1980s, treated successfully with an antibiotic similar to penicillin. She does not currently use inhalers but is open to having a rescue inhaler available if needed.     Assessment and Plan:    Bronchitis Chronic bronchitis with exacerbation. Previous azithromycin  effective. Improvement expected; second course if symptoms persist. Reminded of her flutter valve for airway clearance and rescue inhaler for occasional use. - Prescribe second course of azithromycin  if symptoms persist by Sunday. - Provide albuterol  rescue inhaler for as-needed use. - Advise using flutter valve if available for airway clearance.     ROS-see HPI   + = positive Constitutional:    weight loss, night sweats, fevers, chills, +fatigue, lassitude. HEENT:    headaches, difficulty swallowing, tooth/dental problems, sore throat,       sneezing, itching, ear ache, nasal congestion, post nasal drip, snoring CV:  chest pain, orthopnea, PND, swelling in lower extremities, anasarca,                           dizziness, palpitations Resp:   shortness of breath with exertion or at rest.                productive cough,   non-productive cough, coughing up of blood.              change in color of mucus.  wheezing.   Skin:    rash or lesions. GI:  No-   heartburn, indigestion, abdominal pain, nausea, vomiting, diarrhea,                 change in bowel habits, loss of appetite GU: dysuria, change in color of urine, no urgency or frequency.   flank pain. MS:   joint pain, stiffness, decreased range of motion, +back pain. Neuro-     nothing unusual Psych:  change in mood or affect.  depression or anxiety.   memory loss.  OBJ- Physical Exam General- Alert, Oriented, Affect-appropriate, Distress- none acute, +uncomfortable in exam chair-adjusted as needed to accomodate. Skin- rash-none, lesions- none, excoriation- none Lymphadenopathy- none Head-             Eyes- Gross vision intact, PERRLA, conjunctivae and secretions clear            Ears- Hearing, canals-normal            Nose- Clear, no-Septal dev, mucus, polyps, erosion, perforation             Throat- Mallampati II , mucosa clear , drainage- none, tonsils- atrophic, + teeth Neck- flexible , trachea midline, no stridor , thyroid  nl, carotid no bruit Chest - symmetrical excursion , unlabored           Heart/CV- RRR , no murmur , no gallop  , no rub, nl s1 s2                           - JVD- none , edema- none, stasis changes- none, varices- none           Lung-  diminished/ clear to P&A, wheeze- none, cough+ raspy, productive opaque mucus , dullness-none, rub- none           Chest wall- / Abd-  Br/ Gen/ Rectal- Not done, not indicated Extrem- cyanosis- none, clubbing, none, atrophy- none, strength- nl Neuro- grossly intact to observation

## 2023-12-18 NOTE — Patient Instructions (Signed)
 Ok to cancel July appointment and to keep the October appointment.  Script sent for Zpak to hold. Use it only if you feel you need to. Hopefully you will keep on getting better.  Script sent for albuterol  rescue inhaler to use as needed   inhale 2 puffs every 4-6 hours if needed

## 2023-12-19 ENCOUNTER — Telehealth: Payer: Self-pay | Admitting: Pulmonary Disease

## 2023-12-21 ENCOUNTER — Ambulatory Visit: Payer: Self-pay | Admitting: Internal Medicine

## 2023-12-21 ENCOUNTER — Telehealth: Payer: Self-pay | Admitting: Family Medicine

## 2023-12-21 ENCOUNTER — Telehealth: Payer: Self-pay | Admitting: Internal Medicine

## 2023-12-21 NOTE — Telephone Encounter (Signed)
 1st attempt to triage---Called patient--No answer--Left a voicemail.

## 2023-12-21 NOTE — Telephone Encounter (Signed)
2nd attempt-LVM to return call 

## 2023-12-21 NOTE — Telephone Encounter (Signed)
 Copied from CRM [id number]. Topic: Clinical - Red Word Triage >> Dec 21, 2023  9:10 AM Juliana Ocean wrote: Donna Bailey Word: transfer to Nurse Triage: pt is worse w/ bronchitis this past week than she was a week ago when she saw Dr Linder Revere. Dr Linder Revere told her to call first of week if she did not get better and he would send in Zpak. Pt states her cough is worse too.

## 2023-12-21 NOTE — Telephone Encounter (Signed)
 Dr was supposed to send a Zpak in for her. [facility] does not have it. This message was taken by after hours call center line.

## 2023-12-22 ENCOUNTER — Ambulatory Visit (INDEPENDENT_AMBULATORY_CARE_PROVIDER_SITE_OTHER): Admitting: Allergy & Immunology

## 2023-12-22 ENCOUNTER — Encounter: Payer: Self-pay | Admitting: Allergy & Immunology

## 2023-12-22 ENCOUNTER — Other Ambulatory Visit: Payer: Self-pay

## 2023-12-22 VITALS — BP 118/62 | HR 76 | Temp 97.9°F | Resp 18 | Ht 62.21 in | Wt 148.1 lb

## 2023-12-22 DIAGNOSIS — Z889 Allergy status to unspecified drugs, medicaments and biological substances status: Secondary | ICD-10-CM | POA: Diagnosis not present

## 2023-12-22 DIAGNOSIS — T782XXA Anaphylactic shock, unspecified, initial encounter: Secondary | ICD-10-CM

## 2023-12-22 DIAGNOSIS — B999 Unspecified infectious disease: Secondary | ICD-10-CM

## 2023-12-22 DIAGNOSIS — J310 Chronic rhinitis: Secondary | ICD-10-CM | POA: Diagnosis not present

## 2023-12-22 DIAGNOSIS — T782XXD Anaphylactic shock, unspecified, subsequent encounter: Secondary | ICD-10-CM

## 2023-12-22 MED ORDER — AZITHROMYCIN 250 MG PO TABS: ORAL_TABLET | ORAL | 1 refills | Status: DC

## 2023-12-22 MED ORDER — AZITHROMYCIN 250 MG PO TABS: 250.0000 mg | ORAL_TABLET | Freq: Once | ORAL | 0 refills | Status: AC

## 2023-12-22 NOTE — Progress Notes (Signed)
 FOLLOW UP  Date of Service/Encounter:  12/22/23   Assessment:   Chronic rhinitis - s/p allergen immunotherapy for years with Dr. Linder Revere  Recurrent infections - getting immune workup  Contact dermatitis (neomycin, latex, nickel, and adhesive)  Corn allergy - specifically aflatoxin  Multiple medication allergies - combination of symptoms, unclear if any are true IgE mediated reactions based on current labs reviewed  Complicated past medical history including hyperlipidemia, cardiomyopathy, migraines, and severe reflux  Plan/Recommendations:   1. Chronic rhinitis - We will get environmental testing via the blood. - We will schedule you for intradermal testing. - This might show   2. Recurrent infections - We will obtain some screening labs to evaluate your immune system.  - Labs to evaluate the quantitative [facility]) aspects of your immune system: IgG/IgA/IgM, CBC with differential - Labs to evaluate the qualitative (HOW WELL THEY WORK) aspects of your immune system: CH50, Pneumococcal titers, Tetanus titers, Diphtheria titers - We may consider immunizations with Pneumovax and Tdap to challenge your immune system, and then obtain repeat titers in 4-6 weeks.   3. Recurrent bronchitis  - We order a chest CT to look at your lung architecture a bit more.   4. Return in about 2 weeks (around 01/05/2024) for INTRADERMAL TESTING. You can have the follow up appointment with Dr. Idolina Maker or a Nurse Practicioner (our Nurse Practitioners are excellent and always have Physician oversight!).    Subjective:   Donna Bailey is a 78 y.o. female presenting today for follow up of  Chief Complaint  Patient presents with   Allergies    Recent problem with bronchitis she took the zpac for 5days   Breathing Problem    Donna Bailey has a history of the following: Patient Active Problem List   Diagnosis Date Noted   Acute cough 12/15/2023   Aortic valve sclerosis 12/04/2023    COVID-19 virus infection 01/14/2023   Serous otitis media 01/14/2023   Loss of weight 08/24/2022   Early satiety 08/24/2022   Fundic gland polyps of stomach, benign 08/24/2022   Generalized weakness 08/23/2022   Normocytic anemia 08/23/2022   Chronic diastolic CHF (congestive heart failure) (HCC) 08/23/2022   Ketosis (HCC) 08/23/2022   Hypokalemia due to inadequate potassium intake 08/23/2022   Hypomagnesemia 08/23/2022   Protein-calorie malnutrition, severe (HCC) 08/23/2022   Failure to thrive in adult 08/23/2022   History of vertebral fracture 06/10/2022   Myalgia due to statin 06/04/2022   Falls 02/24/2022   Lumbar spondylosis 02/24/2022   PAD (peripheral artery disease) (HCC) 11/25/2021   Pain of left hand 10/30/2021   Bilateral lower extremity edema 09/25/2021   Acute bronchospasm 09/02/2021   Acute stress disorder 09/02/2021   Amnesia 09/02/2021   Atrophic vaginitis 09/02/2021   Candidiasis 09/02/2021   Chronic kidney disease, stage 3a (HCC) 09/02/2021   Dysphagia 09/02/2021   Hereditary and idiopathic neuropathy, unspecified 09/02/2021   History of migraine 09/02/2021   History of multiple allergies 09/02/2021   Hypotension 09/02/2021   Labile blood pressure 09/02/2021   Mild intermittent asthma 09/02/2021   Other long term (current) drug therapy 09/02/2021   Other specified disorders of bone density and structure, other site 09/02/2021   Overactive bladder 09/02/2021   Pancreatic insufficiency 09/02/2021   Pernicious anemia 09/02/2021   Recurrent cystitis 09/02/2021   Recurrent falls 09/02/2021   Rosacea 09/02/2021   Tinnitus of left ear 09/02/2021   Tremor 09/02/2021   Unspecified abnormal finding in specimens from other organs,  systems and tissues 09/02/2021   Upset stomach 09/02/2021   Vitamin B12 deficiency 09/02/2021   Vitamin D deficiency 09/02/2021   Dizziness 08/23/2021   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   Chest pain 05/23/2021   Essential  hypertension 05/23/2021   AKI (acute kidney injury) (HCC) 05/23/2021   Other fatigue 05/22/2021   Incontinence of feces 05/15/2021   Pelvic floor weakness 05/15/2021   Snores 05/02/2021   Abdominal pain, epigastric 11/28/2020   Change in bowel habits 11/28/2020   Chronic venous insufficiency 10/23/2020   Acute recurrent pansinusitis 08/02/2019   Dental infection 08/02/2019   Dry skin 07/07/2019   Allergic reaction 06/09/2019   Chronic rhinitis 06/09/2019   Adverse food reaction 06/09/2019   Multiple drug allergies 06/09/2019   Pain in right foot 05/25/2019   Right arm pain 04/25/2019   Sore in mouth 04/01/2019   Contusion of right knee 12/23/2018   Close exposure to COVID-19 virus 11/05/2018   Pain of left heel 09/10/2018   Pain in left knee 08/18/2018   Bronchitis, acute 04/15/2018   Upper respiratory infection, acute 04/15/2018   Impingement syndrome of right shoulder region 02/01/2018   Coronary artery disease involving native coronary artery of native heart without angina pectoris 01/22/2018   Hematoma of arm, right, sequela 01/05/2018   Ischemic cardiomyopathy 01/05/2018   Migraine 11/13/2017   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 11/13/2017   Post-traumatic arthritis of ankle, right 12/22/2016   Biceps tendinopathy, right 11/09/2016   Subacromial impingement, right 11/09/2016   Partial nontraumatic tear of rotator cuff, right 11/09/2016   Pelvic organ prolapse quantification stage 3 cystocele 11/26/2015   Cerebral aneurysm 10/31/2015   Obesity (BMI 30-39.9)    Erosive esophagitis 02/23/2013   Hyperlipidemia LDL goal <70 08/26/2011   Tinea 08/26/2011   Food intolerance 03/25/2011   History of colonic polyps 05/15/2010   Asthma with bronchitis 03/28/2010   Seasonal and perennial allergic rhinitis 11/29/2007   TMJ SYNDROME 11/29/2007   GERD 11/29/2007   HIATAL HERNIA 11/29/2007   DEGENERATIVE JOINT DISEASE 11/29/2007   Fibromyalgia 11/29/2007   SCOLIOSIS  11/29/2007   Insomnia 11/29/2007   Myalgia and myositis 11/29/2007   Barrett esophagus     History obtained from: chart review and patient.  Discussed the use of AI scribe software for clinical note transcription with the patient and/or guardian, who gave verbal consent to proceed.  Natash is a 78 y.o. female presenting for a follow up visit. She was last seen in January 2023 by Dr. Burdette Carolin.  At that time, she underwent allergy testing with negative to the entire panel.  This included both environmentals and foods.  She was given a handout on burning mouth syndrome. Patch testing, including with metals, was discussed as well.  She originally saw Dr. Burdette Carolin for an evaluation of a rash which was thought to be due to Nexium .  She had a very thorough workup at that time including alpha gal, tryptase, common food panel, ANA, TSH, inflammatory markers, metabolic panel, and a chronic urticaria index which were all negative.  Carmean was negative at that time as well.  I am not as seen was negative at that time.  She had urine studies done to look for mast cell activation syndrome and mastocytosis.  These were all negative.  She comes today for a completely different set of problems.   Asthma/Respiratory Symptom History: She has a long-standing history of chronic bronchitis and a left collapsed lung, which she has  been managing for several years. She has experienced recurrent episodes of bronchitis, including one severe episode accompanied by pneumonia that lasted six months. Her current symptoms include bronchitis and congestion. She has tried various treatments, including inhalers and CPAP, but these have not been effective. She recalls receiving a liquid injection in the past, which successfully treated her bronchitis, but she cannot remember the specific medication used. She does have a a nebulizer at home, though it does not provide significant relief.   Review of her last note with Dr. Linder Revere shows that  she was treated with back-to-back rounds of azithromycin  for bronchitis.  She had already been treated with Depo-Medrol  and guaifenesin /codeine  cough syrup by Dr. Waymond Hailey without full improvement.  She reported that she was still coughing up green mucus.  She has a significant allergy history, including allergies to mold, corn, and penicillin. She experiences severe reactions, such as welts, when exposed to these allergens. She has undergone extensive allergy testing and treatment, including allergy shots, which she found beneficial.  These were all done by Dr. Linder Revere when he did allergy.  She is particularly sensitive to corn and its derivatives, which complicates her medication options.  She specifically focuses on a mold found and corn across the [place]  that tends to trigger her symptoms.  She has 45 different medication allergies listed.  These were reviewed in her chart.  Allergic Rhinitis Symptom History: She was on allergy shots for a long period of time.    Skin Symptom History: She apparently had some kind of testing done by dermatology at [facility] years ago that noted her sensitivity to polyester.  She had patch testing done in August 2015 at [facility] that included 79 allergens.  She was positive to neomycin only.  It is not clear where her metal allergies popped into the picture, but they are mentioned in various notes over the last 5 years.  03/27/2014 Dermatology OV: "Patch test results: [facility]/ACDS Standard Panel (79 allergens.; gold not tested due to persistent positive reaction from previous patch testing) Neomycin + 1 All other negative  Plastics & glues (25 allergens) All negative  Textile colors & finish series (33 allergens/#34 not tested) All negative   Personal Care Products (13 allergens)  All negative "  GERD Symptom History: She is currently taking reflux medication.  Infection Symptom History: She has a history of sinus infections and ear infections, occurring once or  twice a year. She has also experienced severe allergic reactions to medications containing corn fillers, leading to significant health issues, including a "near-death experience" from a medication reaction. She reports congestion and green sputum production. No recent chest CT but had a normal chest x-ray a week ago. She experiences frequent ear infections and sinus infections.   Otherwise, there have been no changes to her past medical history, surgical history, family history, or social history.    Review of systems otherwise negative other than that mentioned in the HPI.    Objective:   Blood pressure 118/62, pulse 76, temperature 97.9 F (36.6 C), temperature source Temporal, resp. rate 18, height 5' 2.21" (1.58 m), weight 148 lb 1.6 oz (67.2 kg), SpO2 96%. Body mass index is 26.91 kg/m.    Physical Exam Vitals reviewed.  Constitutional:      Appearance: She is well-developed.     Comments: Anxious. Talkative.   HENT:     Head: Normocephalic and atraumatic.     Right Ear: Tympanic membrane, ear canal and external ear normal. No drainage,  swelling or tenderness. Tympanic membrane is not injected, scarred, erythematous, retracted or bulging.     Left Ear: Tympanic membrane, ear canal and external ear normal. No drainage, swelling or tenderness. Tympanic membrane is not injected, scarred, erythematous, retracted or bulging.     Nose: No nasal deformity, septal deviation, mucosal edema or rhinorrhea.     Right Turbinates: Enlarged and swollen.     Left Turbinates: Enlarged and swollen.     Right Sinus: No maxillary sinus tenderness or frontal sinus tenderness.     Left Sinus: No maxillary sinus tenderness or frontal sinus tenderness.     Mouth/Throat:     Mouth: Mucous membranes are not pale and not dry.     Pharynx: Uvula midline.  Eyes:     General:        Right eye: No discharge.        Left eye: No discharge.     Conjunctiva/sclera: Conjunctivae normal.     Right eye:  Right conjunctiva is not injected. No chemosis.    Left eye: Left conjunctiva is not injected. No chemosis.    Pupils: Pupils are equal, round, and reactive to light.  Cardiovascular:     Rate and Rhythm: Normal rate and regular rhythm.     Heart sounds: Normal heart sounds.  Pulmonary:     Effort: Pulmonary effort is normal. No tachypnea, accessory muscle usage or respiratory distress.     Breath sounds: Normal breath sounds. No wheezing, rhonchi or rales.  Chest:     Chest wall: No tenderness.  Abdominal:     Tenderness: There is no abdominal tenderness. There is no guarding or rebound.  Lymphadenopathy:     Head:     Right side of head: No submandibular, tonsillar or occipital adenopathy.     Left side of head: No submandibular, tonsillar or occipital adenopathy.     Cervical: No cervical adenopathy.  Skin:    Coloration: Skin is not pale.     Findings: No abrasion, erythema, petechiae or rash. Rash is not papular, urticarial or vesicular.  Neurological:     Mental Status: She is alert.      Diagnostic studies: labs sent instead       Drexel Gentles, MD  [facility] 

## 2023-12-22 NOTE — Telephone Encounter (Signed)
 Please advise pt states she is no better and requesting Zpak

## 2023-12-22 NOTE — Telephone Encounter (Signed)
 Z-pak sent to pharm per lov note. Pt is aware [facility]

## 2023-12-22 NOTE — Telephone Encounter (Signed)
 Zpak sent with one refill in case she needs to extend it.

## 2023-12-22 NOTE — Patient Instructions (Addendum)
 1. Chronic rhinitis - We will get environmental testing via the blood. - We will schedule you for intradermal testing. - This might show   2. Recurrent infections - We will obtain some screening labs to evaluate your immune system.  - Labs to evaluate the quantitative [facility]) aspects of your immune system: IgG/IgA/IgM, CBC with differential - Labs to evaluate the qualitative (HOW WELL THEY WORK) aspects of your immune system: CH50, Pneumococcal titers, Tetanus titers, Diphtheria titers - We may consider immunizations with Pneumovax and Tdap to challenge your immune system, and then obtain repeat titers in 4-6 weeks.   3. Recurrent bronchitis  - We order a chest CT to look at your lung architecture a bit more.   4. Return in about 2 weeks (around 01/05/2024) for INTRADERMAL TESTING. You can have the follow up appointment with Dr. Idolina Maker or a Nurse Practicioner (our Nurse Practitioners are excellent and always have Physician oversight!).    Please inform us  of any Emergency Department visits, hospitalizations, or changes in symptoms. Call us  before going to the ED for breathing or allergy symptoms since we might be able to fit you in for a sick visit. Feel free to contact us  anytime with any questions, problems, or concerns.  It was a pleasure to meet you today!  Websites that have reliable patient information: 1. American Academy of Asthma, Allergy, and Immunology: www.aaaai.org 2. Food Allergy Research and Education (FARE): foodallergy.org 3. Mothers of Asthmatics: http://www.asthmacommunitynetwork.org 4. American College of Allergy, Asthma, and Immunology: www.acaai.org      "Like" us  on Facebook and Instagram for our latest updates!      A healthy democracy works best when [employer] participate! Make sure you are registered to vote! If you have moved or changed any of your contact information, you will need to get this updated before voting! Scan the QR codes below to learn  more!

## 2023-12-22 NOTE — Telephone Encounter (Signed)
Pt notified on VM. 

## 2024-01-22 ENCOUNTER — Other Ambulatory Visit: Payer: Self-pay | Admitting: Internal Medicine

## 2024-01-22 DIAGNOSIS — U071 COVID-19: Secondary | ICD-10-CM

## 2024-01-22 DIAGNOSIS — J45909 Unspecified asthma, uncomplicated: Secondary | ICD-10-CM

## 2024-01-30 ENCOUNTER — Encounter (HOSPITAL_COMMUNITY): Payer: Self-pay | Admitting: Interventional Radiology

## 2024-02-18 ENCOUNTER — Other Ambulatory Visit: Payer: Self-pay

## 2024-02-18 ENCOUNTER — Observation Stay (HOSPITAL_COMMUNITY)
Admission: EM | Admit: 2024-02-18 | Discharge: 2024-02-18 | Disposition: A | Discharge status: Home / Self Care | Attending: Internal Medicine | Admitting: Internal Medicine

## 2024-02-18 ENCOUNTER — Encounter (HOSPITAL_COMMUNITY): Payer: Self-pay | Admitting: Emergency Medicine

## 2024-02-18 DIAGNOSIS — K920 Hematemesis: Secondary | ICD-10-CM | POA: Diagnosis not present

## 2024-02-18 DIAGNOSIS — Z91040 Latex allergy status: Secondary | ICD-10-CM | POA: Insufficient documentation

## 2024-02-18 DIAGNOSIS — J45909 Unspecified asthma, uncomplicated: Secondary | ICD-10-CM | POA: Insufficient documentation

## 2024-02-18 DIAGNOSIS — I129 Hypertensive chronic kidney disease with stage 1 through stage 4 chronic kidney disease, or unspecified chronic kidney disease: Secondary | ICD-10-CM | POA: Diagnosis not present

## 2024-02-18 DIAGNOSIS — I2510 Atherosclerotic heart disease of native coronary artery without angina pectoris: Secondary | ICD-10-CM | POA: Diagnosis not present

## 2024-02-18 DIAGNOSIS — K922 Gastrointestinal hemorrhage, unspecified: Secondary | ICD-10-CM | POA: Diagnosis not present

## 2024-02-18 DIAGNOSIS — N182 Chronic kidney disease, stage 2 (mild): Secondary | ICD-10-CM | POA: Insufficient documentation

## 2024-02-18 LAB — CBC WITH DIFFERENTIAL/PLATELET
Abs Immature Granulocytes: 0.02 K/uL (ref 0.00–0.07)
Basophils Absolute: 0 K/uL (ref 0.0–0.1)
Basophils Relative: 0 %
Eosinophils Absolute: 0.2 K/uL (ref 0.0–0.5)
Eosinophils Relative: 2 %
HCT: 44.3 % (ref 36.0–46.0)
Hemoglobin: 14.5 g/dL (ref 12.0–15.0)
Immature Granulocytes: 0 %
Lymphocytes Relative: 36 %
Lymphs Abs: 2.4 K/uL (ref 0.7–4.0)
MCH: 31 pg (ref 26.0–34.0)
MCHC: 32.7 g/dL (ref 30.0–36.0)
MCV: 94.9 fL (ref 80.0–100.0)
Monocytes Absolute: 0.7 K/uL (ref 0.1–1.0)
Monocytes Relative: 10 %
Neutro Abs: 3.5 K/uL (ref 1.7–7.7)
Neutrophils Relative %: 52 %
Platelets: 281 K/uL (ref 150–400)
RBC: 4.67 MIL/uL (ref 3.87–5.11)
RDW: 14.9 % (ref 11.5–15.5)
WBC: 6.8 K/uL (ref 4.0–10.5)
nRBC: 0 % (ref 0.0–0.2)

## 2024-02-18 LAB — COMPREHENSIVE METABOLIC PANEL WITH GFR
ALT: 27 U/L (ref 0–44)
AST: 27 U/L (ref 15–41)
Albumin: 4 g/dL (ref 3.5–5.0)
Alkaline Phosphatase: 70 U/L (ref 38–126)
Anion gap: 9 (ref 5–15)
BUN: 27 mg/dL — ABNORMAL HIGH (ref 8–23)
CO2: 25 mmol/L (ref 22–32)
Calcium: 9.6 mg/dL (ref 8.9–10.3)
Chloride: 103 mmol/L (ref 98–111)
Creatinine, Ser: 0.91 mg/dL (ref 0.44–1.00)
GFR, Estimated: >60 mL/min (ref >60)
Glucose, Bld: 97 mg/dL (ref 70–99)
Potassium: 4.6 mmol/L (ref 3.5–5.1)
Sodium: 137 mmol/L (ref 135–145)
Total Bilirubin: 0.7 mg/dL (ref 0.0–1.2)
Total Protein: 7.4 g/dL (ref 6.5–8.1)

## 2024-02-18 LAB — TYPE AND SCREEN
ABO/RH(D): O POS
Antibody Screen: NEGATIVE

## 2024-02-18 LAB — POC OCCULT BLOOD, ED: Fecal Occult Bld: NEGATIVE

## 2024-02-18 LAB — PROTIME-INR
INR: 0.9 (ref 0.8–1.2)
Prothrombin Time: 13 s (ref 11.4–15.2)

## 2024-02-18 MED ORDER — ONDANSETRON HCL 4 MG PO TABS: 4.0000 mg | ORAL_TABLET | Freq: Four times a day (QID) | ORAL | Status: DC | PRN

## 2024-02-18 MED ORDER — LACTATED RINGERS IV BOLUS
500.0000 mL | Freq: Once | INTRAVENOUS | Status: AC
  Administered 2024-02-18: 500 mL via INTRAVENOUS

## 2024-02-18 MED ORDER — PANTOPRAZOLE SODIUM 40 MG IV SOLR: 40.0000 mg | Freq: Two times a day (BID) | INTRAVENOUS | Status: DC

## 2024-02-18 MED ORDER — ALBUTEROL SULFATE (2.5 MG/3ML) 0.083% IN NEBU: 2.5000 mg | INHALATION_SOLUTION | RESPIRATORY_TRACT | Status: DC | PRN

## 2024-02-18 MED ORDER — PANTOPRAZOLE SODIUM 40 MG IV SOLR: 80.0000 mg | INTRAVENOUS | Status: DC

## 2024-02-18 MED ORDER — PANTOPRAZOLE SODIUM 40 MG IV SOLR: 40.0000 mg | INTRAVENOUS | Status: AC

## 2024-02-18 MED ORDER — TRAZODONE HCL 50 MG PO TABS: 25.0000 mg | ORAL_TABLET | Freq: Every evening | ORAL | Status: DC | PRN

## 2024-02-18 MED ORDER — ONDANSETRON HCL 4 MG/2ML IJ SOLN: 4.0000 mg | Freq: Four times a day (QID) | INTRAMUSCULAR | Status: DC | PRN

## 2024-02-18 NOTE — ED Provider Notes (Signed)
 [place] EMERGENCY DEPARTMENT AT [facility] Provider Note   CSN: [id number] Arrival date & time: 02/18/24  9272     Patient presents with: Hematemesis   Donna Bailey is a 78 y.o. female with a past medical history of GERD with Barrett's esophagus, hiatal hernia, CAD w/ STEMI with LAD PCI 2019, CKD , PAD, and HF with EF 55-60% presented to the ED this morning after throwing up a raisin-looking blood clot. She was scheduled for a double root canal, for which she had been taking Clindamycin . She states her first episode of hematemesis was Tuesday morning when she was taking her antibiotic with milk and toast. She states there were 2 clots then. She noticed some bright red blood in the toilet, but thought it was due to her antibiotic and waited to see if it improved. She then threw up a small amount of blood again, but does not recount when it was. This morning she woke for her root canal procedure and took her Clindamycin . Shortly after, she threw up and noticed a larger blood clot that was darker in color. She states she has felt nauseous with her vomiting, and had a small bout of diarrhea this morning. She feels bloated and describes a globus sensation when swallowing for many months. She denies any fever, chills, shortness of breath, chest pain, abdominal pain, or palpitations. She states she sees Dr. Norleen Kiang with [facility], where she gets her routine endoscopy and colonoscopy. She takes her esmoprazole daily. Denies recent NSAID use or alcohol intake. Last endoscopy was 08/2022 found a 1.5cm inflammation in the lower third of the esophagus without dysplasia.      Prior to Admission medications   Medication Sig Start Date End Date Taking? Authorizing Provider  cyanocobalamin  (,VITAMIN B-12,) 1000 MCG/ML injection Inject 1,000 mcg into the muscle every 14 (fourteen) days.   Yes [provider]  esomeprazole  (NEXIUM ) 40 MG capsule TAKE 1 CAPSULE BY MOUTH TWICE A DAY Patient  taking differently: Take 40 mg by mouth in the morning and at bedtime. 01/06/22  Yes Zehr, Jessica D, PA-C  furosemide  (LASIX ) 20 MG tablet Take 1 tablet (20 mg total) by mouth daily. Patient taking differently: Take 20 mg by mouth daily as needed for fluid or edema. 09/01/22  Yes Christobal Guadalajara, MD  Gabapentin  10 % CREA Apply 1 application  topically at bedtime. Feet   Yes [provider]  guaiFENesin -codeine  100-10 MG/5ML syrup Take 5 mLs by mouth every 6 (six) hours as needed for cough. Patient taking differently: Take 5 mLs by mouth at bedtime. 12/15/23  Yes Darlean Ozell NOVAK, MD  inclisiran (LEQVIO ) 284 MG/1.5ML SOSY injection Inject 284 mg into the skin every 6 (six) months.   Yes [provider]  lidocaine  (LIDODERM ) 5 % Place 0.5-1 patches onto the skin daily. Apply 0.5-1 patch to spine and affected shoulders daily FROM 8 AM TO 8 PM DAILY after bath Patient taking differently: Place 1 patch onto the skin daily as needed (pain). 09/01/22  Yes Christobal Guadalajara, MD  nitroGLYCERIN  (NITROSTAT ) 0.4 MG SL tablet Place 1 tablet (0.4 mg total) under the tongue every 5 (five) minutes as needed for chest pain. 11/16/17  Yes Marcine Catalan M, PA-C  ondansetron  (ZOFRAN -ODT) 4 MG disintegrating tablet Take 4-8 mg by mouth daily as needed for nausea or vomiting.   Yes [provider]  oxybutynin (DITROPAN) 5 MG tablet TAKE 1 TABLET BY MOUTH EVERYDAY AT BEDTIME Oral; Duration: 90 Days  Yes [provider]  oxyCODONE  (ROXICODONE ) 5 MG immediate release tablet Take 1 tablet (5 mg total) by mouth every 6 (six) hours as needed for severe pain (pain score 7-10). 06/07/23  Yes Jerrol Agent, MD  progesterone (PROMETRIUM) 100 MG capsule Take 100 mg by mouth at bedtime. 02/03/24  Yes [provider]  ticagrelor  (BRILINTA ) 90 MG TABS tablet TAKE ONE TABLET BY MOUTH TWICE DAILY 01/30/23  Yes Magda Debby SAILOR, MD  tiZANidine (ZANAFLEX) 2 MG tablet Take 1-2 mg by mouth 2 (two) times daily as  needed for muscle spasms.   Yes [provider]  zolpidem  (AMBIEN ) 10 MG tablet Take 10 mg by mouth at bedtime. 07/21/23  Yes [provider]  azithromycin  (ZITHROMAX ) 250 MG tablet 2 today then one daily Patient not taking: Reported on 02/18/2024 12/22/23   Neysa Reggy BIRCH, MD  clindamycin  (CLEOCIN ) 150 MG capsule Take 150 mg by mouth every 8 (eight) hours. Patient not taking: Reported on 02/18/2024 02/11/24   [provider]  NITRO-BID  2 % ointment Apply 1 g topically See admin instructions. Apply 1 gram daily to the finger tips as directed Patient not taking: Reported on 02/18/2024 10/31/21   [provider]  QUEtiapine (SEROQUEL) 25 MG tablet Take 25 mg by mouth at bedtime. Patient not taking: Reported on 02/18/2024 01/31/24   [provider]  zaleplon (SONATA) 10 MG capsule Take 10 mg by mouth at bedtime as needed. Patient not taking: Reported on 02/18/2024 12/25/23   [provider]    Allergies: Cephalosporins, Crestor [rosuvastatin], Doxycycline , Formoterol, Other, Sulfa antibiotics, Sulfonamide derivatives, Ciprofloxacin, Nitrofurantoin, Penicillins, Prednisone , Amlodipine , Caffeine, Carvedilol , Cephalexin, Dilt-xr [diltiazem  hcl], Esomeprazole , Formoterol fumarate, Fosfomycin, Gold sodium thiosulfate, Gold-containing drug products, Hydralazine , Levofloxacin in d5w, Macrodantin [nitrofurantoin macrocrystal], Moxifloxacin, Neomycin, Ofloxacin, Praluent  [alirocumab ], Pregabalin, Ranexa  [ranolazine  er], Repatha [evolocumab], Valsartan , Welchol [colesevelam], Zetia [ezetimibe], Zolpidem , Acetaminophen , Fexofenadine, Ibuprofen, Latex, Levofloxacin, Mold extract [trichophyton mentagrophyte], Molds & smuts, Nickel, Tape, and Trichophyton    Review of Systems  Constitutional:  Negative for unexpected weight change.  Respiratory:  Positive for cough. Negative for shortness of breath.   Cardiovascular:  Negative for chest pain, palpitations and leg  swelling.  Gastrointestinal:  Positive for abdominal distention, diarrhea, nausea and vomiting. Negative for abdominal pain, blood in stool and constipation.  Genitourinary:  Negative for difficulty urinating, dysuria and frequency.  Neurological:  Negative for dizziness, weakness and headaches.    Updated Vital Signs BP (!) 118/56 (BP Location: Left Arm)   Pulse 62   Temp 97.7 F (36.5 C) (Oral)   Resp 16   SpO2 100%   Physical Exam Constitutional:      Appearance: Normal appearance.  HENT:     Head: Normocephalic and atraumatic.     Mouth/Throat:     Mouth: Mucous membranes are moist.     Pharynx: Oropharynx is clear. No oropharyngeal exudate or posterior oropharyngeal erythema.  Eyes:     Extraocular Movements: Extraocular movements intact.     Pupils: Pupils are equal, round, and reactive to light.  Cardiovascular:     Rate and Rhythm: Normal rate and regular rhythm.  Pulmonary:     Effort: Pulmonary effort is normal.     Breath sounds: Normal breath sounds.  Abdominal:     General: Abdomen is flat.     Palpations: Abdomen is soft.     Tenderness: There is abdominal tenderness.     Comments: Tenderness to RLQ, no guarding or rebound   Musculoskeletal:  General: Tenderness present. No swelling.     Cervical back: Normal range of motion.     Comments: Tender to palpation of BLE  Skin:    General: Skin is warm and dry.  Neurological:     General: No focal deficit present.     Mental Status: She is alert and oriented to person, place, and time.  Psychiatric:        Mood and Affect: Mood normal.        Behavior: Behavior normal.     (all labs ordered are listed, but only abnormal results are displayed) Labs Reviewed  COMPREHENSIVE METABOLIC PANEL WITH GFR - Abnormal; Notable for the following components:      Result Value   BUN 27 (*)    All other components within normal limits  CBC WITH DIFFERENTIAL/PLATELET  PROTIME-INR  OCCULT BLOOD X 1 CARD TO LAB,  STOOL  POC OCCULT BLOOD, ED  TYPE AND SCREEN    EKG: None  Radiology: No results found.   Procedures   Medications Ordered in the ED  pantoprazole  (PROTONIX ) injection 40 mg (40 mg Intravenous Not Given 02/18/24 1000)    Followed by  pantoprazole  (PROTONIX ) injection 40 mg (has no administration in time range)  traZODone  (DESYREL ) tablet 25 mg (has no administration in time range)  ondansetron  (ZOFRAN ) tablet 4 mg (has no administration in time range)    Or  ondansetron  (ZOFRAN ) injection 4 mg (has no administration in time range)  albuterol  (PROVENTIL ) (2.5 MG/3ML) 0.083% nebulizer solution 2.5 mg (has no administration in time range)  lactated ringers  bolus 500 mL (0 mLs Intravenous Stopped 02/18/24 0959)                                    Medical Decision Making Patient presented to ED for hematemesis with hx of Barrett's esophagus. Pt HDS and states she is not having any associated melena or hematochezia. Differential diagnosis includes esophagitis vs esophageal hemorrhage vs PUD vs gastric malignancy. Will order FOBT to rule out lower GI bleed. Labs and coags pending.   Labs nromal. Hgb 14.5 and BUN slightly elevated at 27. FOBT negative. Consulted Jessica Zehr with [facility] and DR. Mir Roxane with [facility] for medical observation and potential EGD tomorrow for UGI bleed. Both agreed to observe with EGD to be scheduled. Pt voiced understanding.   Amount and/or Complexity of Data Reviewed Labs: ordered.  Risk Prescription drug management. Decision regarding hospitalization.        Final diagnoses:  None    ED Discharge Orders     None          Sharnay Cashion, DO 02/18/24 1147    Dreama Longs, MD 02/18/24 2231
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
## 2024-02-18 NOTE — Progress Notes (Signed)
 SATURATION QUALIFICATIONS: (This note is used to comply with regulatory documentation for home oxygen)  Patient Saturations on Room Air at Rest = 100%  Patient Saturations on Room Air while Ambulating = 97%  Patient Saturations on RA Liters of oxygen while Ambulating = N/A  Please briefly explain why patient needs home oxygen:

## 2024-02-18 NOTE — Discharge Summary (Signed)
 Discharge Summary  [name] FMW:[id number] DOB: 04/20/1946  PCP: Okey Carlin Redbird, MD  Admit date: 02/18/2024 Discharge date: 02/18/2024   Recommendations for Outpatient Follow-up:  Please follow up with your PCP with CBC and BMP in 1-2 weeks  Discharge Diagnoses:  Active Hospital Problems   Diagnosis Date Noted   Upper GI bleed 02/18/2024    Resolved Hospital Problems  No resolved problems to display.   Discharge Condition: Stable   Diet recommendation: Diet Orders (From admission, onward)     Start     Ordered   02/18/24 1720  Diet regular Fluid consistency: Thin  Diet effective now       Question:  Fluid consistency:  Answer:  Thin   02/18/24 1719           HPI and Brief Hospital Course:  54F admit for observation for concern for GI bleeding. : Donna Bailey is a 78 y.o. female with medical history significant for CAD on Brilinta , CKD stage II, Barrett's esophagitis being admitted for observation to the hospitalist service with concern for hematemesis.  She had dental work including tooth extraction a couple of weeks ago, and is preparing for a couple of root canals which are scheduled for today.  For the last couple of days, she has been taking p.o. clindamycin , has been taking it on empty stomach and vomited several times as a result.  Yesterday she vomited twice, the first time she coughed up a raisin sized dark red object that looked like a blood clot.  This happened again this morning, so she came to the ER for evaluation.  She denies any blood in her stool, dizziness, lightheadedness.  She does take Brilinta  twice daily.  She also has a globus sensation with swallowing.   Initially plan was for observation and possible EGD in AM. However patient has remained asymptomatic and was seen by Dr. Avram who felt patient does not need EGD and can go home. Patient requests to be DC home, and was told to follow up with GI as needed.  Discharge details, plan of  care and follow up instructions were discussed with patient and any available family or care providers. Patient and family are in agreement with discharge from the hospital today and all questions were answered to their satisfaction.  Discharge Exam: BP (!) 135/56 (BP Location: Right Arm)   Pulse 70   Temp 98 F (36.7 C)   Resp 16   Ht 5' 5 (1.651 m)   Wt 67.4 kg   SpO2 98%   BMI 24.73 kg/m  General:  Alert, oriented, calm, in no acute distress  Eyes: EOMI, clear sclerea Neck: supple, no masses, trachea mildline  Cardiovascular: RRR, no murmurs or rubs, no peripheral edema  Respiratory: clear to auscultation bilaterally, no wheezes, no crackles  Abdomen: soft, nontender, nondistended, normal bowel tones heard  Skin: dry, no rashes  Musculoskeletal: no joint effusions, normal range of motion  Psychiatric: appropriate affect, normal speech  Neurologic: extraocular muscles intact, clear speech, moving all extremities with intact sensorium   Discharge Instructions You were cared for by a hospitalist during your hospital stay. If you have any questions about your discharge medications or the care you received while you were in the hospital after you are discharged, you can call the unit and asked to speak with the hospitalist on call if the hospitalist that took care of you is not available. Once you are discharged, your primary care physician will handle any  further medical issues. Please note that NO REFILLS for any discharge medications will be authorized once you are discharged, as it is imperative that you return to your primary care physician (or establish a relationship with a primary care physician if you do not have one) for your aftercare needs so that they can reassess your need for medications and monitor your lab values.   Allergies as of 02/18/2024       Reactions   Cephalosporins Itching, Other (See Comments)   Tolerated cefdinir  in 2019 Other Reaction(s): Other (See  Comments) unknown   Crestor [rosuvastatin] Other (See Comments)   Lost all muscle mobility    Doxycycline  Diarrhea, Nausea And Vomiting   Formoterol Other (See Comments)   Reaction not recalled   Other Anaphylaxis, Itching, Rash, Other (See Comments)   Polyester-pins sticking in her skin   Sulfa Antibiotics Anaphylaxis   Sulfonamide Derivatives Anaphylaxis   Ciprofloxacin Other (See Comments), Rash   Other Reaction(s): Other (See Comments) unknown, unknown   Nitrofurantoin Diarrhea, Nausea And Vomiting, Other (See Comments)   Also, convulsions/constant shaking- per patient   Penicillins Rash   Underarms (both) Has patient had a PCN reaction causing immediate rash, facial/tongue/throat swelling, SOB or lightheadedness with hypotension: YES Has patient had a PCN reaction causing severe rash involving mucus membranes or skin necrosis: NO Has patient had a PCN reaction that required hospitalization NO Has patient had a PCN reaction occurring within the last 10 years: NO If all of the above answers are NO, then may proceed with Cephalosporin use. Other reaction(s): Other (See Comments) Underarms (both) Other Reaction: red hot skin Underarms (both) Has patient had a PCN reaction causing immediate rash, facial/tongue/throat swelling, SOB or lightheadedness with hypotension: YES Has patient had a PCN reaction causing severe rash involving mucus membranes or skin necrosis: NO Has patient had a PCN reaction that required hospitalization NO Has patient had a PCN reaction occurring within the last 10 years: NO If all of the above answers are NO, then may proceed with Cephalosporin use.   Prednisone  Itching   Pt states she cannot take prednisone  with corn filler 05/19/2019.   Amlodipine  Swelling, Other (See Comments)   Swelling in legs   Caffeine Other (See Comments)   Heart races Other Reaction(s): Other (See Comments) Heart race, Heart race   Carvedilol     dry mouth   Cephalexin  Other (See Comments)   Reaction not recalled by the patient   Dilt-xr [diltiazem  Hcl]    LE edema   Esomeprazole  Other (See Comments)   Reaction not cited   Formoterol Fumarate Other (See Comments)   Reaction not recalled   Fosfomycin    Nerve pain in her legs   Gold Sodium Thiosulfate Other (See Comments)   Positive patch test   Gold-containing Drug Products Other (See Comments)   Skin tingles   Hydralazine     Levofloxacin In D5w Other (See Comments)   Racing heart Other Reaction(s): Other (See Comments) Other Reaction: racing heart   Macrodantin [nitrofurantoin Macrocrystal] Itching   Moxifloxacin Other (See Comments)   Caused hands to shake Other Reaction(s): Other (See Comments) Doesn't remember, Other Reaction: Shaking hands, Doesn't remember   Neomycin Other (See Comments)   Doesn't remember - allergist said not to use it because it could add more allergies- Positive patch test   Ofloxacin Itching, Other (See Comments)   Praluent  [alirocumab ] Other (See Comments)   Shaking   Pregabalin Other (See Comments)   Ranexa  [ranolazine  Er]  itching   Repatha [evolocumab]    shaking   Valsartan  Other (See Comments)   Pt reports this med causes her to feel sluggish and she feels heaviness on her shoulders.   Welchol [colesevelam] Other (See Comments)   Reaction not cited   Zetia [ezetimibe]    Zolpidem     Can tolerate in 2024   Acetaminophen  Other (See Comments), Rash   Facial rash On face, On face, On face   Fexofenadine Palpitations, Other (See Comments)   Heart races   Ibuprofen Rash   Latex Rash, Other (See Comments)   Skin gets red   Levofloxacin Other (See Comments), Palpitations   HEART RACING Other Reaction(s): Other (See Comments) REACTION: HEART RACING, REACTION: HEART RACING, Other Reaction: racing heart, REACTION: HEART RACING   Mold Extract [trichophyton Mentagrophyte] Other (See Comments)   Bumps on back, stops up sinuses.   Molds & Smuts Rash,  Other (See Comments)   Bumps on back, stops up sinuses.   Nickel Rash   Tape Rash   Trichophyton Rash, Other (See Comments)   Bumps on back, stops up sinuses        Medication List     TAKE these medications    azithromycin  250 MG tablet Commonly known as: ZITHROMAX  2 today then one daily   Brilinta  90 MG Tabs tablet Generic drug: ticagrelor  TAKE ONE TABLET BY MOUTH TWICE DAILY   clindamycin  150 MG capsule Commonly known as: CLEOCIN  Take 150 mg by mouth every 8 (eight) hours.   cyanocobalamin  1000 MCG/ML injection Commonly known as: VITAMIN B12 Inject 1,000 mcg into the muscle every 14 (fourteen) days.   esomeprazole  40 MG capsule Commonly known as: NEXIUM  TAKE 1 CAPSULE BY MOUTH TWICE A DAY What changed: when to take this   furosemide  40 MG tablet Commonly known as: LASIX  Take 40 mg by mouth daily as needed for fluid or edema.   Gabapentin  10 % Crea Apply 1 application  topically at bedtime. Feet   guaiFENesin -codeine  100-10 MG/5ML syrup Take 5 mLs by mouth every 6 (six) hours as needed for cough. What changed: when to take this   Leqvio  284 MG/1.5ML Sosy injection Generic drug: inclisiran Inject 284 mg into the skin every 6 (six) months.   lidocaine  5 % Commonly known as: LIDODERM  Place 0.5-1 patches onto the skin daily. Apply 0.5-1 patch to spine and affected shoulders daily FROM 8 AM TO 8 PM DAILY after bath What changed:  how much to take when to take this reasons to take this additional instructions   nitroGLYCERIN  0.4 MG SL tablet Commonly known as: NITROSTAT  Place 1 tablet (0.4 mg total) under the tongue every 5 (five) minutes as needed for chest pain.   Nitro-Bid  2 % ointment Generic drug: nitroGLYCERIN  Apply 1 g topically See admin instructions. Apply 1 gram daily to the finger tips as directed   ondansetron  4 MG disintegrating tablet Commonly known as: ZOFRAN -ODT Take 4-8 mg by mouth daily as needed for nausea or vomiting.    oxybutynin 5 MG tablet Commonly known as: DITROPAN TAKE 1 TABLET BY MOUTH EVERYDAY AT BEDTIME Oral; Duration: 90 Days   oxyCODONE  5 MG immediate release tablet Commonly known as: Roxicodone  Take 1 tablet (5 mg total) by mouth every 6 (six) hours as needed for severe pain (pain score 7-10).   progesterone 100 MG capsule Commonly known as: PROMETRIUM Take 100 mg by mouth at bedtime.   QUEtiapine 25 MG tablet Commonly known as: SEROQUEL Take 25 mg by  mouth at bedtime.   tiZANidine 2 MG tablet Commonly known as: ZANAFLEX Take 1-2 mg by mouth 2 (two) times daily as needed for muscle spasms.   zaleplon 10 MG capsule Commonly known as: SONATA Take 10 mg by mouth at bedtime as needed.   zolpidem  10 MG tablet Commonly known as: AMBIEN  Take 10 mg by mouth at bedtime.       Allergies  Allergen Reactions   Cephalosporins Itching and Other (See Comments)    Tolerated cefdinir  in 2019  Other Reaction(s): Other (See Comments)  unknown   Crestor [Rosuvastatin] Other (See Comments)    Lost all muscle mobility    Doxycycline  Diarrhea and Nausea And Vomiting   Formoterol Other (See Comments)    Reaction not recalled   Other Anaphylaxis, Itching, Rash and Other (See Comments)    Polyester-pins sticking in her skin    Sulfa Antibiotics Anaphylaxis   Sulfonamide Derivatives Anaphylaxis   Ciprofloxacin Other (See Comments) and Rash    Other Reaction(s): Other (See Comments)  unknown, unknown   Nitrofurantoin Diarrhea, Nausea And Vomiting and Other (See Comments)    Also, convulsions/constant shaking- per patient   Penicillins Rash    Underarms (both) Has patient had a PCN reaction causing immediate rash, facial/tongue/throat swelling, SOB or lightheadedness with hypotension: YES Has patient had a PCN reaction causing severe rash involving mucus membranes or skin necrosis: NO Has patient had a PCN reaction that required hospitalization NO Has patient had a PCN reaction  occurring within the last 10 years: NO If all of the above answers are NO, then may proceed with Cephalosporin use. Other reaction(s): Other (See Comments) Underarms (both) Other Reaction: red hot skin Underarms (both) Has patient had a PCN reaction causing immediate rash, facial/tongue/throat swelling, SOB or lightheadedness with hypotension: YES Has patient had a PCN reaction causing severe rash involving mucus membranes or skin necrosis: NO Has patient had a PCN reaction that required hospitalization NO Has patient had a PCN reaction occurring within the last 10 years: NO If all of the above answers are NO, then may proceed with Cephalosporin use.   Prednisone  Itching    Pt states she cannot take prednisone  with corn filler 05/19/2019.   Amlodipine  Swelling and Other (See Comments)    Swelling in legs   Caffeine Other (See Comments)    Heart races  Other Reaction(s): Other (See Comments)  Heart race, Heart race   Carvedilol      dry mouth   Cephalexin Other (See Comments)    Reaction not recalled by the patient   Dilt-Xr [Diltiazem  Hcl]     LE edema   Esomeprazole  Other (See Comments)    Reaction not cited   Formoterol Fumarate Other (See Comments)    Reaction not recalled   Fosfomycin     Nerve pain in her legs   Gold Sodium Thiosulfate Other (See Comments)    Positive patch test   Gold-Containing Drug Products Other (See Comments)    Skin tingles   Hydralazine     Levofloxacin In D5w Other (See Comments)    Racing heart  Other Reaction(s): Other (See Comments)  Other Reaction: racing heart   Macrodantin [Nitrofurantoin Macrocrystal] Itching   Moxifloxacin Other (See Comments)    Caused hands to shake  Other Reaction(s): Other (See Comments)  Doesn't remember, Other Reaction: Shaking hands, Doesn't remember   Neomycin Other (See Comments)    Doesn't remember - allergist said not to use it because it could add more allergies-  Positive patch test     Ofloxacin Itching and Other (See Comments)   Praluent  [Alirocumab ] Other (See Comments)    Shaking   Pregabalin Other (See Comments)   Ranexa  [Ranolazine  Er]     itching   Repatha [Evolocumab]     shaking   Valsartan  Other (See Comments)    Pt reports this med causes her to feel sluggish and she feels heaviness on her shoulders.   Welchol [Colesevelam] Other (See Comments)    Reaction not cited   Zetia [Ezetimibe]    Zolpidem      Can tolerate in 2024   Acetaminophen  Other (See Comments) and Rash    Facial rash  On face, On face, On face   Fexofenadine Palpitations and Other (See Comments)    Heart races   Ibuprofen Rash   Latex Rash and Other (See Comments)    Skin gets red    Levofloxacin Other (See Comments) and Palpitations    HEART RACING  Other Reaction(s): Other (See Comments)  REACTION: HEART RACING, REACTION: HEART RACING, Other Reaction: racing heart, REACTION: HEART RACING   Mold Extract [Trichophyton Mentagrophyte] Other (See Comments)    Bumps on back, stops up sinuses.   Molds & Smuts Rash and Other (See Comments)    Bumps on back, stops up sinuses.   Nickel Rash   Tape Rash   Trichophyton Rash and Other (See Comments)    Bumps on back, stops up sinuses     The results of significant diagnostics from this hospitalization (including imaging, microbiology, ancillary and laboratory) are listed below for reference.    Significant Diagnostic Studies: No results found.  Microbiology: No results found for this or any previous visit (from the past 240 hours).   Labs: Basic Metabolic Panel: Recent Labs  Lab 02/18/24 0918  NA 137  K 4.6  CL 103  CO2 25  GLUCOSE 97  BUN 27*  CREATININE 0.91  CALCIUM  9.6   Liver Function Tests: Recent Labs  Lab 02/18/24 0918  AST 27  ALT 27  ALKPHOS 70  BILITOT 0.7  PROT 7.4  ALBUMIN  4.0   No results for input(s): LIPASE, AMYLASE in the last 168 hours. No results for input(s): AMMONIA in the last  168 hours. CBC: Recent Labs  Lab 02/18/24 0916  WBC 6.8  NEUTROABS 3.5  HGB 14.5  HCT 44.3  MCV 94.9  PLT 281   Cardiac Enzymes: No results for input(s): CKTOTAL, CKMB, CKMBINDEX, TROPONINI in the last 168 hours. BNP: BNP (last 3 results) No results for input(s): BNP in the last 8760 hours.  ProBNP (last 3 results) No results for input(s): PROBNP in the last 8760 hours.  CBG: No results for input(s): GLUCAP in the last 168 hours.  Time spent: > 30 minutes were spent in preparing this discharge including medication reconciliation, counseling, and coordination of care.  Signed:  Bynum Mccullars CHRISTELLA Gail, MD  [facility] 02/18/2024, 7:14 PM
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
## 2024-02-18 NOTE — ED Triage Notes (Signed)
 Pt BIB EMS from home, c/o blood in emesis. Had dental work last week and scheduled for root canal today. Tuesday pt took oral ABT on empty stomach and nausea/vomit x2. Today pt took oral ABT again on empty stomach and nausea/vomit x2, only this time endorse raisin size blood clot.   BP 162/80 P 78 SpO2 98%

## 2024-02-18 NOTE — Care Management Obs Status (Signed)
 MEDICARE OBSERVATION STATUS NOTIFICATION   Patient Details  Name: Donna Bailey MRN: [id number] Date of Birth: 04-Jul-1946   Medicare Observation Status Notification Given:  Yes    Doneta Glenys DASEN, RN 02/18/2024, 2:37 PM

## 2024-02-18 NOTE — Plan of Care (Signed)

## 2024-02-18 NOTE — TOC Initial Note (Signed)
 Transition of Care [facility]) - Initial/Assessment Note    Patient Details  Name: Donna Bailey MRN: [id number] Date of Birth: 02-May-1946  Transition [facility]) CM/SW Contact:    Doneta Glenys DASEN, RN Phone Number: 02/18/2024,   Clinical Narrative:                 MOON completed. CM spoke with patient in the room. Patient states PTA lives with spouse Ubaldo in a house;PCP/insurance verified;DME- cane, walker, BSC;Denies HH, oxygen or SDOH needs; Patient states husband or neighbor will transport home. TOC will follow for progression to discharge.  Expected Discharge Plan: Home/Self Care Barriers to Discharge: Continued Medical Work up   Patient Goals and CMS Choice Patient states their goals for this hospitalization and ongoing recovery are:: Home with spouse CMS Medicare.gov Compare Post Acute Care list provided to::  (NA) Choice offered to / list presented to : NA      Expected Discharge Plan and Services In-house Referral: NA Discharge Planning Services: CM Consult Post Acute Care Choice: NA Living arrangements for the past 2 months: Single Family Home                 DME Arranged: N/A DME Agency: NA       HH Arranged: NA HH Agency: NA        Prior Living Arrangements/Services Living arrangements for the past 2 months: Single Family Home Lives with:: Spouse Patient language and need for interpreter reviewed:: Yes Do you feel safe going back to the place where you live?: Yes      Need for Family Participation in Patient Care: No (Comment) Care giver support system in place?: Yes (comment) Current home services: DME (cane, walker, BSC) Criminal Activity/Legal Involvement Pertinent to Current Situation/Hospitalization: No - Comment as needed  Activities of Daily Living   ADL Screening (condition at time of admission) Independently performs ADLs?: Yes (appropriate for developmental age)  Permission Sought/Granted Permission sought to share information with :  Case Manager Permission granted to share information with : Yes, Verbal Permission Granted  Share Information with NAME: Cornacchia,Larry (Spouse)  [phone]           Emotional Assessment Appearance:: Appears stated age Attitude/Demeanor/Rapport: Engaged Affect (typically observed): Appropriate Orientation: : Oriented to Self, Oriented to Place, Oriented to  Time, Oriented to Situation Alcohol / Substance Use: Not Applicable Psych Involvement: No (comment)  Admission diagnosis:  Upper GI bleed [K92.2] Patient Active Problem List   Diagnosis Date Noted   Upper GI bleed 02/18/2024   Acute cough 12/15/2023   Aortic valve sclerosis 12/04/2023   COVID-19 virus infection 01/14/2023   Serous otitis media 01/14/2023   Loss of weight 08/24/2022   Early satiety 08/24/2022   Fundic gland polyps of stomach, benign 08/24/2022   Generalized weakness 08/23/2022   Normocytic anemia 08/23/2022   Chronic diastolic CHF (congestive heart failure) (HCC) 08/23/2022   Ketosis (HCC) 08/23/2022   Hypokalemia due to inadequate potassium intake 08/23/2022   Hypomagnesemia 08/23/2022   Protein-calorie malnutrition, severe (HCC) 08/23/2022   Failure to thrive in adult 08/23/2022   History of vertebral fracture 06/10/2022   Myalgia due to statin 06/04/2022   Falls 02/24/2022   Lumbar spondylosis 02/24/2022   PAD (peripheral artery disease) (HCC) 11/25/2021   Pain of left hand 10/30/2021   Bilateral lower extremity edema 09/25/2021   Acute bronchospasm 09/02/2021   Acute stress disorder 09/02/2021   Amnesia 09/02/2021   Atrophic vaginitis 09/02/2021   Candidiasis 09/02/2021  Chronic kidney disease, stage 3a (HCC) 09/02/2021   Dysphagia 09/02/2021   Hereditary and idiopathic neuropathy, unspecified 09/02/2021   History of migraine 09/02/2021   History of multiple allergies 09/02/2021   Hypotension 09/02/2021   Labile blood pressure 09/02/2021   Mild intermittent asthma 09/02/2021   Other long  term (current) drug therapy 09/02/2021   Other specified disorders of bone density and structure, other site 09/02/2021   Overactive bladder 09/02/2021   Pancreatic insufficiency 09/02/2021   Pernicious anemia 09/02/2021   Recurrent cystitis 09/02/2021   Recurrent falls 09/02/2021   Rosacea 09/02/2021   Tinnitus of left ear 09/02/2021   Tremor 09/02/2021   Unspecified abnormal finding in specimens from other organs, systems and tissues 09/02/2021   Upset stomach 09/02/2021   Vitamin B12 deficiency 09/02/2021   Vitamin D deficiency 09/02/2021   Dizziness 08/23/2021   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   Chest pain 05/23/2021   Essential hypertension 05/23/2021   AKI (acute kidney injury) (HCC) 05/23/2021   Other fatigue 05/22/2021   Incontinence of feces 05/15/2021   Pelvic floor weakness 05/15/2021   Snores 05/02/2021   Abdominal pain, epigastric 11/28/2020   Change in bowel habits 11/28/2020   Chronic venous insufficiency 10/23/2020   Acute recurrent pansinusitis 08/02/2019   Dental infection 08/02/2019   Dry skin 07/07/2019   Allergic reaction 06/09/2019   Chronic rhinitis 06/09/2019   Adverse food reaction 06/09/2019   Multiple drug allergies 06/09/2019   Pain in right foot 05/25/2019   Right arm pain 04/25/2019   Sore in mouth 04/01/2019   Contusion of right knee 12/23/2018   Close exposure to COVID-19 virus 11/05/2018   Pain of left heel 09/10/2018   Pain in left knee 08/18/2018   Bronchitis, acute 04/15/2018   Upper respiratory infection, acute 04/15/2018   Impingement syndrome of right shoulder region 02/01/2018   Coronary artery disease involving native coronary artery of native heart without angina pectoris 01/22/2018   Hematoma of arm, right, sequela 01/05/2018   Ischemic cardiomyopathy 01/05/2018   Migraine 11/13/2017   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 11/13/2017   Post-traumatic arthritis of ankle, right 12/22/2016   Biceps tendinopathy, right  11/09/2016   Subacromial impingement, right 11/09/2016   Partial nontraumatic tear of rotator cuff, right 11/09/2016   Pelvic organ prolapse quantification stage 3 cystocele 11/26/2015   Cerebral aneurysm 10/31/2015   Obesity (BMI 30-39.9)    Erosive esophagitis 02/23/2013   Hyperlipidemia LDL goal <70 08/26/2011   Tinea 08/26/2011   Food intolerance 03/25/2011   History of colonic polyps 05/15/2010   Asthma with bronchitis 03/28/2010   Seasonal and perennial allergic rhinitis 11/29/2007   TMJ SYNDROME 11/29/2007   GERD 11/29/2007   HIATAL HERNIA 11/29/2007   DEGENERATIVE JOINT DISEASE 11/29/2007   Fibromyalgia 11/29/2007   SCOLIOSIS 11/29/2007   Insomnia 11/29/2007   Myalgia and myositis 11/29/2007   Barrett esophagus    PCP:  Okey Carlin Redbird, MD Pharmacy:   [facility] [phone] - [place], [place] - [address] AT [address] OF [facility] [address] [place] [address] Phone: [phone] Fax: [phone]  [facility] [place], [place] - [address] [place] [address] Phone: [phone] Fax: [phone]  [id number] - [place], [place] - [address] AT [facility] [address] [place] [address] Phone: [phone] Fax: [phone]     Social Drivers of Health (SDOH)  Social History: SDOH Screenings   Food Insecurity: Low Risk  (03/24/2023)   Received from [facility]  Housing: Low Risk  (03/24/2023)   Received from [facility]  Transportation Needs: No Transportation Needs (03/24/2023)   Received from [facility]  Utilities: Low Risk  (03/24/2023)   Received from [facility]  Depression (PHQ2-9): Low Risk  (09/01/2023)  Social Connections: Unknown (12/02/2021)   Received from [facility]  Tobacco Use: Low Risk  (02/18/2024)   SDOH Interventions:     Readmission Risk Interventions    09/01/2022   12:16 PM 11/27/2021    11:39 AM  Readmission Risk Prevention Plan  Transportation Screening Complete Complete  PCP or Specialist Appt within 5-7 Days Complete Complete  Home Care Screening Complete Complete  Medication Review (RN CM) Complete Complete

## 2024-02-21 NOTE — Progress Notes (Signed)
 HPI female never smoker followed for bronchitis, allergic rhinitis, chronic insomnia, intolerance of all corn products, complicated by GERD and multiple medical problems, CAD/MI/ stent Seroquel made her too dizzy. Belsomra 15 mg made her muscles stiff and left her groggy but did help her sleep. Rozerem  did not help with sleep but made her dizzy She again requests prescription for zolpidem  10 mg, insisting that nothing else has worked as well to help her manage chronic insomnia. She limits foods with magnesium  which she says make her feel hot She tells me she is allergic to nickel, gold and polyester all of which conduct electricity. Therefore she wants a letter for her to present to [employer] asking them to change her electric meter to a noncommunicating electric meter, so that [employer] will stop sending radio waves through her home. She prefers a manual meter check.  ------------------------------------------------------------------------------------------     12/18/23- 78 yoF followed for hx of allergic Rhinitis, chronic Bronchitis,  complicated by CAD/ MI/ stent/CM,  and intolerance to corn products. Obesity, Hyperlipidemia, Migraine, Hiatal Hernia, GERD/ Barretts, Vertebral Compression Fractures, -zolpidem , Ventolin  hfa, LOV Wert 5/13> nexium  BID, depomedrol, guaifenesis/codeine  cough syrup CXR 12/15/23 IMPRESSION: 1. No acute cardiopulmonary abnormality. ------Still coughing up green mucous, getting lighter.  Has not taken her temperature, she would get hot and then get cold.  Would not last long. Discussed the use of AI scribe software for clinical note transcription with the patient, who gave verbal consent to proceed.  History of Present Illness   Donna Bailey is a 78 year old female with chronic bronchitis who presents with a persistent cough.  She experiences a persistent cough with green sputum production, which is gradually improving. Symptoms began in May,  differing from previous episodes that typically started in April. She was prescribed a Z-Pak, which has been effective in the past without adverse reactions. She has a history of broad intolerance to many med, especially attributed to corn starch fillers. there is little objective confirmation. It is understood that failure to respond to azithromycin  would likely lead to hospital referral for IV meds, and/ or home therapy with a central line.  Her medical history includes chronic bronchitis, pneumonia, and a lung collapse in the late 1980s, treated successfully with an antibiotic similar to penicillin. She does not currently use inhalers but is open to having a rescue inhaler available if needed.     Assessment and Plan:    Bronchitis Chronic bronchitis with exacerbation. Previous azithromycin  effective. Improvement expected; second course if symptoms persist. Reminded of her flutter valve for airway clearance and rescue inhaler for occasional use. - Prescribe second course of azithromycin  if symptoms persist by Sunday. - Provide albuterol  rescue inhaler for as-needed use. - Advise using flutter valve if available for airway clearance.   02/23/24-  78 yoF followed for hx of allergic Rhinitis, chronic Bronchitis,  complicated by CAD/ MI/ stent/CM,  and intolerance to corn products. Obesity, Hyperlipidemia, Migraine, Hiatal Hernia, GERD/ Barretts, Vertebral Compression Fractures, -zolpidem  10, Ventolin  hfa, Hosp 7/17- UGI bleed, hx Barrett's. Discussed the use of AI scribe software for clinical note transcription with the patient, who gave verbal consent to proceed.  History of Present Illness   Donna Bailey is a 78 year old female who presents with sleep difficulties and hx chronic bronchitis. She has a complex history of medication intolerance..  She experiences chronic insomnia with prolonged sleep onset and limited sleep duration of two to three hours. Codeine  cough syrup aids  in  falling asleep, and she inquires about renewing this medication. There are no breathing issues affecting her sleep, and she navigates at night without difficulty or risk of falling. Medication talk and sleep hygiene discussion. Now in mid-summer her bronchitis is uncomplicated.     Assessment and Plan:    Chronic bronchitis Chronic bronchitis inactive currently.  - Continue codeine  cough syrup as needed.  Insomnia Chronic insomnia with difficulty initiating and maintaining sleep. Codeine  cough syrup effective for sleep. No caffeine after lunch advised to improve sleep. - Prescribe codeine  cough syrup for sleep aid. Safety and side effects discussion. - Advise against caffeine consumption after lunch.  Gastroesophageal reflux disease (GERD) Recent hematemesis led to medication change. No endoscopy performed during hospital visit. Advised GI follow-up. - Follow up with GI specialist for GERD evaluation and potential endoscopy.     ROS-see HPI   + = positive Constitutional:    weight loss, night sweats, fevers, chills, +fatigue, lassitude. HEENT:    headaches, difficulty swallowing, tooth/dental problems, sore throat,       sneezing, itching, ear ache, nasal congestion, post nasal drip, snoring CV:    chest pain, orthopnea, PND, swelling in lower extremities, anasarca,                           dizziness, palpitations Resp:   shortness of breath with exertion or at rest.                productive cough,   non-productive cough, coughing up of blood.              change in color of mucus.  wheezing.   Skin:    rash or lesions. GI:  No-   heartburn, indigestion, abdominal pain, nausea, vomiting, diarrhea,                 change in bowel habits, loss of appetite GU: dysuria, change in color of urine, no urgency or frequency.   flank pain. MS:   joint pain, stiffness, decreased range of motion, +back pain. Neuro-     nothing unusual Psych:  change in mood or affect.  depression or anxiety.    memory loss.  OBJ- Physical Exam General- Alert, Oriented, Affect-appropriate, Distress- none acute,  Skin- rash-none, lesions- none, excoriation- none Lymphadenopathy- none Head-             Eyes- Gross vision intact, PERRLA, conjunctivae and secretions clear            Ears- Hearing, canals-normal            Nose- Clear, no-Septal dev, mucus, polyps, erosion, perforation             Throat- Mallampati II , mucosa clear , drainage- none, tonsils- atrophic, + teeth Neck- flexible , trachea midline, no stridor , thyroid  nl, carotid no bruit Chest - symmetrical excursion , unlabored           Heart/CV- RRR , +murmur+1-2/6 S , no gallop  , no rub, nl s1 s2                           - JVD- none , edema- none, stasis changes- none, varices- none           Lung-  diminished/ clear to P&A, wheeze- none, cough+ raspy, productive opaque mucus , dullness-none, rub- none  Chest wall- / Abd-  Br/ Gen/ Rectal- Not done, not indicated Extrem- cyanosis- none, clubbing, none, atrophy- none, strength- nl Neuro- grossly intact to observation

## 2024-02-23 ENCOUNTER — Encounter: Payer: Self-pay | Admitting: Internal Medicine

## 2024-02-23 ENCOUNTER — Ambulatory Visit: Admitting: Internal Medicine

## 2024-02-23 DIAGNOSIS — U071 COVID-19: Secondary | ICD-10-CM

## 2024-02-23 DIAGNOSIS — J45909 Unspecified asthma, uncomplicated: Secondary | ICD-10-CM | POA: Diagnosis not present

## 2024-02-23 MED ORDER — GUAIFENESIN-CODEINE 100-10 MG/5ML PO SOLN: 5.0000 mL | Freq: Four times a day (QID) | ORAL | 0 refills | Status: AC | PRN

## 2024-02-23 NOTE — Patient Instructions (Addendum)
 Script sent refilling guaifenesin  with codeine  cough syrup as discussed  Please call if we can help  Ok to keep Oct 7 appointment

## 2024-02-26 ENCOUNTER — Ambulatory Visit: Admitting: Internal Medicine

## 2024-02-26 ENCOUNTER — Encounter: Payer: Self-pay | Admitting: Internal Medicine

## 2024-02-26 ENCOUNTER — Ambulatory Visit: Attending: Internal Medicine | Admitting: Internal Medicine

## 2024-02-26 VITALS — BP 118/78 | HR 78 | Ht 65.0 in | Wt 149.6 lb

## 2024-02-26 DIAGNOSIS — Z955 Presence of coronary angioplasty implant and graft: Secondary | ICD-10-CM | POA: Diagnosis not present

## 2024-02-26 DIAGNOSIS — Z789 Other specified health status: Secondary | ICD-10-CM

## 2024-02-26 DIAGNOSIS — T466X5A Adverse effect of antihyperlipidemic and antiarteriosclerotic drugs, initial encounter: Secondary | ICD-10-CM

## 2024-02-26 DIAGNOSIS — I739 Peripheral vascular disease, unspecified: Secondary | ICD-10-CM | POA: Diagnosis not present

## 2024-02-26 DIAGNOSIS — I10 Essential (primary) hypertension: Secondary | ICD-10-CM

## 2024-02-26 DIAGNOSIS — M791 Myalgia, unspecified site: Secondary | ICD-10-CM | POA: Diagnosis not present

## 2024-02-26 DIAGNOSIS — E785 Hyperlipidemia, unspecified: Secondary | ICD-10-CM

## 2024-02-26 DIAGNOSIS — T466X5D Adverse effect of antihyperlipidemic and antiarteriosclerotic drugs, subsequent encounter: Secondary | ICD-10-CM

## 2024-02-26 MED ORDER — TICAGRELOR 60 MG PO TABS: 60.0000 mg | ORAL_TABLET | Freq: Two times a day (BID) | ORAL | 3 refills | Status: AC
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
## 2024-02-26 NOTE — Patient Instructions (Signed)
 Medication Instructions:  Decrease Brilinta  to 60 mg twice daily *If you need a refill on your cardiac medications before your next appointment, please call your pharmacy*  Lab Work: NONE If you have labs (blood work) drawn today and your tests are completely normal, you will receive your results only by: MyChart Message (if you have MyChart) OR A paper copy in the mail If you have any lab test that is abnormal or we need to change your treatment, we will call you to review the results.  Testing/Procedures: NONE  Follow-Up: At [facility], you and your health needs are our priority.  As part of our continuing mission to provide you with exceptional heart care, our providers are all part of one team.  This team includes your primary Cardiologist (physician) and Advanced Practice Providers or APPs (Physician Assistants and Nurse Practitioners) who all work together to provide you with the care you need, when you need it.  Your next appointment:   6 month(s)  Provider:   Glendia Ferrier, PA-C  We recommend signing up for the patient portal called MyChart.  Sign up information is provided on this After Visit Summary.  MyChart is used to connect with patients for Virtual Visits (Telemedicine).  Patients are able to view lab/test results, encounter notes, upcoming appointments, etc.  Non-urgent messages can be sent to your provider as well.   To learn more about what you can do with MyChart, go to [email or web address].   Other Instructions You have been referred to Vascular Surgery. Someone will reach out to  you to schedule an appointment.

## 2024-03-06 ENCOUNTER — Encounter: Payer: Self-pay | Admitting: Internal Medicine

## 2024-03-11 ENCOUNTER — Telehealth: Payer: Self-pay | Admitting: Internal Medicine

## 2024-03-11 NOTE — Telephone Encounter (Signed)
 PT requesting to speak to a nurse regarding her medications. She feels that she is not taking the the right way. Her stomach issues are still persistent and getting worse.She has now been sick almost 2 months. Please advise.

## 2024-03-11 NOTE — Telephone Encounter (Signed)
 Patient calls with complaints of being sick all summer. States that she has constant nausea with loss of appetite. Over the last several weeks, has had a few vomiting episodes, mostly following swallowing pills. States that she had small amount of blood in vomit on one occasion, and another small amount on a subsequent occasion but went to the hospital where no testing was completed. Has had 2 vomiting episodes over the last day but states that it was dark and she didn't turn on the light so does not know if there was any hematemesis. She does take Brilinta . Patient goes on to state that her doctor told her to take omeprazole twice a day and she has been doing this but it is not helpful. When going over her medication list, I asked if she was still taking esomeprazole  twice daily to which I was told she was. She denied taking pantoprazole . I explained that she should not be taking omeprazole twice daily and esomeprazole  twice daily as these medications are in the same drug class and used for the same diagnosis. She then tells me she is only taking esomeprazole , not omeprazole. Says she takes zofran  1-2 tablets only as needed and very infrequently. This is also ineffective. Patient has a difficult time explaining her concerns other than to say she does not think she has been given the right medications to take.  She denies any shortness of breath, dizziness, chest pain, abdominal pain. Does have occasional diarrhea but says this alternates with constipation as well. Patient requests to see Dr Abran in follow up.  She has been scheduled for visit with Dr Abran on 03/25/24 as requested.  She is advised should she have any additional episodes of hematemesis, SOB, Dizziness, chest pain, inability to tolerate PO, she should again be seen in the emergency room for sooner evaluation. She verbalizes understanding.

## 2024-03-16 ENCOUNTER — Telehealth: Payer: Self-pay

## 2024-03-16 NOTE — Telephone Encounter (Signed)
   Pre-operative Risk Assessment    Patient Name: Donna Bailey  DOB: June 22, 1946 MRN: [id number]   Date of last office visit: 02/26/24 VINIE MAXCY, MD Date of next office visit: NONE   Request for Surgical Clearance    Procedure:  SCS  Date of Surgery:  Clearance TBD                                Surgeon:  NOT INDICATED Surgeon's Group or Practice Name:  [facility] Phone number:  [phone] Fax number:  [phone]   Type of Clearance Requested:   - Medical  - Pharmacy:  Hold Ticagrelor  (Brilinta ) 3 DAYS BEFORE AND 7 DAYS AFTER   Type of Anesthesia:  Not Indicated   Additional requests/questions:    Signed, Lucie DELENA Ku   03/16/2024, 3:45 PM

## 2024-03-18 NOTE — Telephone Encounter (Signed)
   Patient Name: Donna Bailey  DOB: 1946/04/06 MRN: [id number]  Primary Cardiologist: Vinie JAYSON Maxcy, MD  Chart reviewed as part of pre-operative protocol coverage. Given past medical history and time since last visit, based on ACC/AHA guidelines, Donna Bailey is at acceptable risk for the planned procedure without further cardiovascular testing  Per Dr. Maxcy: Ok to hold Brilinta  for surgery - typically, we advise 5 days prior to procedure and can be up to 7 days after (since it takes about 5 days for the Brilinta  effects to clear). Acceptable risk for surgery. .   The patient was advised that if she develops new symptoms prior to surgery to contact our office to arrange for a follow-up visit, and she verbalized understanding.  I will route this recommendation to the requesting party via Epic fax function and remove from pre-op pool.  Please call with questions.  Lamarr Satterfield, NP 03/18/2024, 7:43 AM

## 2024-03-25 ENCOUNTER — Ambulatory Visit: Admitting: Internal Medicine

## 2024-03-25 ENCOUNTER — Encounter: Payer: Self-pay | Admitting: Internal Medicine

## 2024-03-25 VITALS — BP 142/80 | HR 68 | Ht 64.0 in | Wt 142.4 lb

## 2024-03-25 DIAGNOSIS — Z860100 Personal history of colon polyps, unspecified: Secondary | ICD-10-CM

## 2024-03-25 DIAGNOSIS — R1110 Vomiting, unspecified: Secondary | ICD-10-CM

## 2024-03-25 DIAGNOSIS — K219 Gastro-esophageal reflux disease without esophagitis: Secondary | ICD-10-CM | POA: Diagnosis not present

## 2024-03-25 NOTE — Patient Instructions (Signed)
 You have been scheduled for a gastric emptying scan at [facility] on 04/07/2024 at  7:30am. Please arrive at least 30 minutes prior to your appointment for registration. Please make certain not to have anything to eat or drink after midnight the night before your test. Hold all stomach medications (ex: Zofran , phenergan , Reglan ) 24 hours prior to your test. If you need to reschedule your appointment, please contact radiology scheduling at [phone]. _____________________________________________________________________ A gastric-emptying study measures how long it takes for food to move through your stomach. There are several ways to measure stomach emptying. In the most common test, you eat food that contains a small amount of radioactive material. A scanner that detects the movement of the radioactive material is placed over your abdomen to monitor the rate at which food leaves your stomach. This test normally takes about 4 hours to complete. _____________________________________________________________________ _______________________________________________________  If your blood pressure at your visit was 140/90 or greater, please contact your primary care physician to follow up on this.  _______________________________________________________  If you are age 51 or older, your body mass index should be between 23-30. Your Body mass index is 24.44 kg/m. If this is out of the aforementioned range listed, please consider follow up with your Primary Care Provider.  If you are age 81 or younger, your body mass index should be between 19-25. Your Body mass index is 24.44 kg/m. If this is out of the aformentioned range listed, please consider follow up with your Primary Care Provider.   ________________________________________________________  The [facility] providers would like to encourage you to use MYCHART to communicate with providers for non-urgent requests or questions.  Due to long  hold times on the telephone, sending your provider a message by [facility] may be a faster and more efficient way to get a response.  Please allow 48 business hours for a response.  Please remember that this is for non-urgent requests.  _______________________________________________________  [facility] is using a team-based approach to care.  Your team is made up of your doctor and two to three APPS. Our APPS (Nurse Practitioners and Physician Assistants) work with your physician to ensure care continuity for you. They are fully qualified to address your health concerns and develop a treatment plan. They communicate directly with your gastroenterologist to care for you. Seeing the Advanced Practice Practitioners on your physician's team can help you by facilitating care more promptly, often allowing for earlier appointments, access to diagnostic testing, procedures, and other specialty referrals.
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
## 2024-03-25 NOTE — Telephone Encounter (Signed)
 Patient called requesting a prescription for Zithromax  but was told that her attending pulmonologist wanted to defer antibiotics. She was fine with the explanation.

## 2024-03-28 ENCOUNTER — Other Ambulatory Visit (HOSPITAL_COMMUNITY)

## 2024-04-07 ENCOUNTER — Encounter (HOSPITAL_COMMUNITY)
Admission: RE | Admit: 2024-04-07 | Discharge: 2024-04-07 | Disposition: A | Discharge status: Home / Self Care | Source: Ambulatory Visit | Attending: Internal Medicine | Admitting: Internal Medicine

## 2024-04-07 DIAGNOSIS — R1110 Vomiting, unspecified: Secondary | ICD-10-CM | POA: Diagnosis present

## 2024-04-07 MED ORDER — TECHNETIUM TC 99M SULFUR COLLOID
2.0000 | Freq: Once | INTRAVENOUS | Status: AC
  Administered 2024-04-07: 2 via ORAL

## 2024-04-12 ENCOUNTER — Ambulatory Visit: Payer: Self-pay | Admitting: Internal Medicine

## 2024-04-18 ENCOUNTER — Other Ambulatory Visit: Payer: Self-pay

## 2024-04-18 DIAGNOSIS — I771 Stricture of artery: Secondary | ICD-10-CM

## 2024-04-19 ENCOUNTER — Other Ambulatory Visit: Payer: Self-pay | Admitting: Vascular Surgery

## 2024-05-05 ENCOUNTER — Ambulatory Visit: Payer: Medicare Other | Admitting: Internal Medicine

## 2024-05-09 NOTE — Progress Notes (Signed)
 HPI female never smoker followed for bronchitis, allergic rhinitis, chronic insomnia, intolerance of all corn products, complicated by GERD and multiple medical problems, CAD/MI/ stent Seroquel made her too dizzy. Belsomra 15 mg made her muscles stiff and left her groggy but did help her sleep. Rozerem  did not help with sleep but made her dizzy She again requests prescription for zolpidem  10 mg, insisting that nothing else has worked as well to help her manage chronic insomnia. She limits foods with magnesium  which she says make her feel hot She tells me she is allergic to nickel, gold and polyester all of which conduct electricity. Therefore she wants a letter for her to present to [facility] asking them to change her electric meter to a noncommunicating electric meter, so that [employer] will stop sending radio waves through her home. She prefers a manual meter check.  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 Obesity, Hyperlipidemia, Migraine, Hiatal Hernia, GERD/ Barretts, Vertebral Compression Fractures, -zolpidem  10, Ventolin  hfa, Hosp 7/17- UGI bleed, hx Barrett's. Discussed the use of AI scribe software for clinical note transcription with the patient, who gave verbal consent to proceed.  History of Present Illness   Donna Bailey is a 78 year old female who presents with sleep difficulties and hx chronic bronchitis. She has a complex history of medication intolerance..  She experiences chronic insomnia with prolonged sleep onset and limited sleep duration of two to three hours. Codeine  cough syrup aids in falling asleep, and she inquires about renewing this medication. There are no breathing issues affecting her sleep, and she navigates at night without difficulty or risk of  falling. Medication talk and sleep hygiene discussion. Now in mid-summer her bronchitis is uncomplicated.     Assessment and Plan:    Chronic bronchitis Chronic bronchitis inactive currently.  - Continue codeine  cough syrup as needed.  Insomnia Chronic insomnia with difficulty initiating and maintaining sleep. Codeine  cough syrup effective for sleep. No caffeine after lunch advised to improve sleep. - Prescribe codeine  cough syrup for sleep aid. Safety and side effects discussion. - Advise against caffeine consumption after lunch.  Gastroesophageal reflux disease (GERD) Recent hematemesis led to medication change. No endoscopy performed during hospital visit. Advised GI follow-up. - Follow up with GI specialist for GERD evaluation and potential endoscopy.   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 Obesity, Hyperlipidemia, Migraine, Hiatal Hernia, GERD/ Barretts, Vertebral Compression Fractures, -zolpidem  10, Ventolin  hfa, Discussed the use of AI scribe software for clinical note transcription with the patient, who gave verbal consent to proceed.  History of Present Illness   Donna Bailey is a 78 year old female with a history of sinus infections who presents with sinus congestion and ear pain. She is accompanied by her husband, Ubaldo.  Her symptoms began over ten days ago, with the worst days being two days ago and three days ago. She experiences head congestion, ear pain, and sinus discomfort, including pain in her teeth. She denies chest congestion and states her lungs are clear. She denies swollen glands and is not obviously running a fever, although she feels unwell. She has a history of sinus infections with seasonal changes and typically responds well to azithromycin  (Z-Pak).     Assessment and Plan:    Acute sinusitis  Acute sinusitis with head congestion, ear pain,  and sinus discomfort for over ten days. No chest involvement. Azithromycin  effective in past. - Prescribe azithromycin  (Z-Pak). - Use [facility] for prescription. -saline nasal lavage  Influenza vaccination Influenza vaccination given. No fever, safe to proceed. Discussed potential mild side effects. - Administer influenza vaccination. - Advise increased fluid intake and use of Tylenol  for post-vaccination symptoms.     ROS-see HPI   + = positive Constitutional:    weight loss, night sweats, fevers, chills, +fatigue, lassitude. HEENT:    headaches, difficulty swallowing, tooth/dental problems, sore throat,       sneezing, itching, ear ache, nasal congestion, post nasal drip, snoring CV:    chest pain, orthopnea, PND, swelling in lower extremities, anasarca,                           dizziness, palpitations Resp:   shortness of breath with exertion or at rest.                productive cough,   non-productive cough, coughing up of blood.              change in color of mucus.  wheezing.   Skin:    rash or lesions. GI:  No-   heartburn, indigestion, abdominal pain, nausea, vomiting, diarrhea,                 change in bowel habits, loss of appetite GU: dysuria, change in color of urine, no urgency or frequency.   flank pain. MS:   joint pain, stiffness, decreased range of motion, +back pain. Neuro-     nothing unusual Psych:  change in mood or affect.  depression or anxiety.   memory loss.  OBJ- Physical Exam General- Alert, Oriented, Affect-appropriate, Distress- none acute,  Skin- rash-none, lesions- none, excoriation- none Lymphadenopathy- none Head-             Eyes- Gross vision intact, PERRLA, conjunctivae and secretions clear            Ears- Hearing, canals-normal            Nose- Clear, no-Septal dev, mucus, polyps, erosion, perforation             Throat- Mallampati II , mucosa clear , drainage- none, tonsils- atrophic, + teeth Neck- flexible , trachea midline,  no stridor , thyroid  nl, carotid no bruit Chest - symmetrical excursion , unlabored           Heart/CV- RRR , +murmur+1-2/6 S , no gallop  , no rub, nl s1 s2                           - JVD- none , edema- none, stasis changes- none, varices- none           Lung-  diminished/ clear to P&A, wheeze- none, cough+ raspy, productive opaque mucus , dullness-none, rub- none           Chest wall- / Abd-  Br/ Gen/ Rectal- Not done, not indicated Extrem- cyanosis- none, clubbing, none, atrophy- none, strength- nl Neuro- grossly intact to observation

## 2024-05-10 ENCOUNTER — Ambulatory Visit: Admitting: Internal Medicine

## 2024-05-10 ENCOUNTER — Encounter: Payer: Self-pay | Admitting: Internal Medicine

## 2024-05-10 VITALS — BP 136/77 | HR 73 | Temp 97.5°F | Ht 65.0 in | Wt 151.4 lb

## 2024-05-10 DIAGNOSIS — J42 Unspecified chronic bronchitis: Secondary | ICD-10-CM

## 2024-05-10 DIAGNOSIS — G4700 Insomnia, unspecified: Secondary | ICD-10-CM

## 2024-05-10 DIAGNOSIS — K219 Gastro-esophageal reflux disease without esophagitis: Secondary | ICD-10-CM

## 2024-05-10 DIAGNOSIS — Z23 Encounter for immunization: Secondary | ICD-10-CM | POA: Diagnosis not present

## 2024-05-10 MED ORDER — AZITHROMYCIN 250 MG PO TABS: ORAL_TABLET | ORAL | 0 refills | Status: AC

## 2024-05-10 NOTE — Patient Instructions (Signed)
 Order- Flu vax senior  Script sent for [facility]

## 2024-05-12 ENCOUNTER — Encounter: Payer: Self-pay | Admitting: Internal Medicine

## 2024-05-18 ENCOUNTER — Ambulatory Visit: Admitting: Internal Medicine

## 2024-05-18 ENCOUNTER — Ambulatory Visit

## 2024-05-18 ENCOUNTER — Ambulatory Visit (HOSPITAL_COMMUNITY)
Admission: RE | Admit: 2024-05-18 | Discharge: 2024-05-18 | Disposition: A | Discharge status: Home / Self Care | Source: Ambulatory Visit | Attending: Vascular Surgery | Admitting: Vascular Surgery

## 2024-05-18 VITALS — BP 146/78 | HR 71 | Temp 98.0°F | Wt 150.7 lb

## 2024-05-18 DIAGNOSIS — I771 Stricture of artery: Secondary | ICD-10-CM | POA: Diagnosis not present
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
## 2024-06-06 ENCOUNTER — Ambulatory Visit (INDEPENDENT_AMBULATORY_CARE_PROVIDER_SITE_OTHER)

## 2024-06-06 VITALS — BP 173/88 | HR 65 | Temp 97.6°F | Resp 16 | Ht 65.0 in | Wt 156.0 lb

## 2024-06-06 DIAGNOSIS — E785 Hyperlipidemia, unspecified: Secondary | ICD-10-CM

## 2024-06-06 MED ORDER — INCLISIRAN SODIUM 284 MG/1.5ML ~~LOC~~ SOSY
284.0000 mg | PREFILLED_SYRINGE | Freq: Once | SUBCUTANEOUS | Status: AC
  Administered 2024-06-06: 284 mg via SUBCUTANEOUS
  Filled 2024-06-06: qty 1.5

## 2024-06-06 NOTE — Progress Notes (Signed)
 Diagnosis: Hyperlipidemia  Provider:  Mannam, Praveen MD  Procedure: Injection  Leqvio  (inclisiran), Dose: 284 mg, Site: subcutaneous, Number of injections: 1  Injection Site(s): Right lower quad. abdomne  Post Care:    Discharge: Condition: Good, Destination: Home . AVS Provided  Performed by:  Savir Blanke, RN

## 2024-07-22 ENCOUNTER — Encounter: Payer: Self-pay | Admitting: Internal Medicine

## 2024-08-11 ENCOUNTER — Encounter: Payer: Self-pay | Admitting: Internal Medicine

## 2024-08-23 ENCOUNTER — Ambulatory Visit: Admitting: Podiatry

## 2024-08-23 DIAGNOSIS — L84 Corns and callosities: Secondary | ICD-10-CM | POA: Diagnosis not present

## 2024-08-23 DIAGNOSIS — B351 Tinea unguium: Secondary | ICD-10-CM

## 2024-08-23 DIAGNOSIS — M79675 Pain in left toe(s): Secondary | ICD-10-CM | POA: Diagnosis not present

## 2024-08-23 DIAGNOSIS — G609 Hereditary and idiopathic neuropathy, unspecified: Secondary | ICD-10-CM | POA: Diagnosis not present

## 2024-08-23 DIAGNOSIS — M79674 Pain in right toe(s): Secondary | ICD-10-CM | POA: Diagnosis not present

## 2024-08-29 ENCOUNTER — Encounter: Payer: Self-pay | Admitting: Podiatry

## 2024-08-29 NOTE — Progress Notes (Signed)
 "  Subjective:  Patient ID: Donna Bailey, female    DOB: Jun 24, 1946,  MRN: [id number]  Donna Bailey presents to clinic today for at risk foot care with history of peripheral neuropathy and callus(es) of both feet and painful mycotic toenails that are difficult to trim. Painful toenails interfere with ambulation. Aggravating factors include wearing enclosed shoe gear. Pain is relieved with periodic professional debridement. Painful calluses are aggravated when weightbearing with and without shoegear. Pain is relieved with periodic professional debridement.  Chief Complaint  Patient presents with   RFC   New problem(s): None.   PCP is Okey Carlin Redbird, MD.  Allergies[1]  Review of Systems: Negative except as noted in the HPI.  Objective: No changes noted in today's physical examination. There were no vitals filed for this visit. Donna Bailey is a pleasant 79 y.o. female WD, WN in NAD. AAO x 3.  Vascular Examination: Capillary refill time immediate b/l. Vascular status intact b/l with palpable pedal pulses. Pedal hair present b/l. No pain with calf compression b/l. Skin temperature gradient WNL b/l. No cyanosis or clubbing b/l. No ischemia or gangrene noted b/l.   Neurological Examination: Pt has subjective symptoms of neuropathy. Sensation grossly intact b/l with 10 gram monofilament. Vibratory sensation intact b/l.   Dermatological Examination: Pedal skin with normal turgor, texture and tone b/l.  No open wounds. No interdigital macerations.   Toenails 1-5 b/l thick, discolored, elongated with subungual debris and pain on dorsal palpation.   Hyperkeratotic lesion(s) submet head 5 b/l.  No erythema, no edema, no drainage, no fluctuance.  Musculoskeletal Examination: Muscle strength 5/5 to all lower extremity muscle groups bilaterally. No pain, crepitus or joint limitation noted with ROM b/l LE. No gross bony pedal deformities b/l. Patient ambulates independently  without assistive aids.  Radiographs: None  Assessment/Plan: 1. Pain due to onychomycosis of toenails of both feet   2. Callus   3. Hereditary and idiopathic peripheral neuropathy   Patient was evaluated and treated. All patient's and/or POA's questions/concerns addressed on today's visit. Toenails 1-5 b/l debrided in length and girth without incident. Callus(es) submet head 5 b/l pared with sharp debridement without incident. Applied Dancer's pads to insoles of both feet to offload calluses. Continue soft, supportive shoe gear daily. Report any pedal injuries to medical professional. Call office if there are any questions/concerns.  Return in about 3 months (around 11/21/2024).  Donna Bailey, DPM      [place] LOCATION: [address] [place] [address]                   Office [phone]   [place] LOCATION: [address] [place], [place] [address] Office [phone]     [1]  Allergies Allergen Reactions   Cephalosporins Itching and Other (See Comments)    Tolerated cefdinir  in 2019  Other Reaction(s): Other (See Comments)  unknown   Crestor [Rosuvastatin] Other (See Comments)    Lost all muscle mobility    Doxycycline  Diarrhea and Nausea And Vomiting   Formoterol Other (See Comments)    Reaction not recalled   Other Anaphylaxis, Itching, Rash and Other (See Comments)    Polyester-pins sticking in her skin  Sulfa Antibiotics Anaphylaxis   Sulfonamide Derivatives Anaphylaxis   Ciprofloxacin Other (See Comments) and Rash    Other Reaction(s): Other (See Comments)  unknown, unknown   Nitrofurantoin Diarrhea, Nausea And Vomiting and Other (See Comments)    Also, convulsions/constant shaking- per patient   Penicillins Rash    Underarms (both) Has patient had a PCN reaction causing immediate rash, facial/tongue/throat swelling, SOB or lightheadedness with hypotension: YES Has patient had a  PCN reaction causing severe rash involving mucus membranes or skin necrosis: NO Has patient had a PCN reaction that required hospitalization NO Has patient had a PCN reaction occurring within the last 10 years: NO If all of the above answers are NO, then may proceed with Cephalosporin use. Other reaction(s): Other (See Comments) Underarms (both) Other Reaction: red hot skin Underarms (both) Has patient had a PCN reaction causing immediate rash, facial/tongue/throat swelling, SOB or lightheadedness with hypotension: YES Has patient had a PCN reaction causing severe rash involving mucus membranes or skin necrosis: NO Has patient had a PCN reaction that required hospitalization NO Has patient had a PCN reaction occurring within the last 10 years: NO If all of the above answers are NO, then may proceed with Cephalosporin use.   Prednisone  Itching    Pt states she cannot take prednisone  with corn filler 05/19/2019.   Amlodipine  Swelling and Other (See Comments)    Swelling in legs   Caffeine Other (See Comments)    Heart races  Other Reaction(s): Other (See Comments)  Heart race, Heart race   Carvedilol      dry mouth   Cephalexin Other (See Comments)    Reaction not recalled by the patient   Dilt-Xr [Diltiazem  Hcl]     LE edema   Esomeprazole  Other (See Comments)    Reaction not cited   Formoterol Fumarate Other (See Comments)    Reaction not recalled   Fosfomycin     Nerve pain in her legs   Gold Sodium Thiosulfate Other (See Comments)    Positive patch test   Gold-Containing Drug Products Other (See Comments)    Skin tingles   Hydralazine     Levofloxacin In D5w Other (See Comments)    Racing heart  Other Reaction(s): Other (See Comments)  Other Reaction: racing heart   Macrodantin [Nitrofurantoin Macrocrystal] Itching   Moxifloxacin Other (See Comments)    Caused hands to shake  Other Reaction(s): Other (See Comments)  Doesn't remember, Other Reaction: Shaking  hands, Doesn't remember   Neomycin Other (See Comments)    Doesn't remember - allergist said not to use it because it could add more allergies- Positive patch test    Ofloxacin Itching and Other (See Comments)   Praluent  [Alirocumab ] Other (See Comments)    Shaking   Pregabalin Other (See Comments)   Ranexa  [Ranolazine  Er]     itching   Repatha [Evolocumab]     shaking   Valsartan  Other (See Comments)    Pt reports this med causes her to feel sluggish and she feels heaviness on her shoulders.   Welchol [Colesevelam] Other (See Comments)    Reaction not cited   Zetia [Ezetimibe]    Zolpidem      Can tolerate in 2024   Acetaminophen  Other (See Comments) and Rash    Facial rash  On face, On face, On face   Fexofenadine Palpitations and Other (See Comments)    Heart races   Ibuprofen Rash   Latex Rash and Other (See Comments)    Skin  gets red    Levofloxacin Other (See Comments) and Palpitations    HEART RACING  Other Reaction(s): Other (See Comments)  REACTION: HEART RACING, REACTION: HEART RACING, Other Reaction: racing heart, REACTION: HEART RACING   Mold Extract [Trichophyton Mentagrophyte] Other (See Comments)    Bumps on back, stops up sinuses.   Molds & Smuts Rash and Other (See Comments)    Bumps on back, stops up sinuses.   Nickel Rash   Tape Rash   Trichophyton Rash and Other (See Comments)    Bumps on back, stops up sinuses   "

## 2024-11-04 ENCOUNTER — Ambulatory Visit

## 2024-11-23 ENCOUNTER — Ambulatory Visit: Admitting: Podiatry
